# Patient Record
Sex: Male | Born: 1960 | Race: Black or African American | Hispanic: No | Smoking: Current some day smoker
Health system: Southern US, Community
[De-identification: ages and names within clinical notes are randomized; demographics above are authoritative.]

## PROBLEM LIST (undated history)

## (undated) DIAGNOSIS — I2510 Atherosclerotic heart disease of native coronary artery without angina pectoris: Secondary | ICD-10-CM

## (undated) DIAGNOSIS — F17200 Nicotine dependence, unspecified, uncomplicated: Secondary | ICD-10-CM

## (undated) DIAGNOSIS — R972 Elevated prostate specific antigen [PSA]: Secondary | ICD-10-CM

## (undated) DIAGNOSIS — I219 Acute myocardial infarction, unspecified: Secondary | ICD-10-CM

## (undated) DIAGNOSIS — I10 Essential (primary) hypertension: Secondary | ICD-10-CM

## (undated) DIAGNOSIS — D62 Acute posthemorrhagic anemia: Secondary | ICD-10-CM

## (undated) DIAGNOSIS — E876 Hypokalemia: Secondary | ICD-10-CM

## (undated) DIAGNOSIS — D119 Benign neoplasm of major salivary gland, unspecified: Secondary | ICD-10-CM

## (undated) DIAGNOSIS — I447 Left bundle-branch block, unspecified: Secondary | ICD-10-CM

## (undated) DIAGNOSIS — I639 Cerebral infarction, unspecified: Secondary | ICD-10-CM

## (undated) DIAGNOSIS — I255 Ischemic cardiomyopathy: Secondary | ICD-10-CM

## (undated) DIAGNOSIS — R0600 Dyspnea, unspecified: Secondary | ICD-10-CM

## (undated) DIAGNOSIS — I214 Non-ST elevation (NSTEMI) myocardial infarction: Secondary | ICD-10-CM

## (undated) DIAGNOSIS — I5020 Unspecified systolic (congestive) heart failure: Secondary | ICD-10-CM

## (undated) DIAGNOSIS — G459 Transient cerebral ischemic attack, unspecified: Secondary | ICD-10-CM

## (undated) DIAGNOSIS — R9389 Abnormal findings on diagnostic imaging of other specified body structures: Secondary | ICD-10-CM

## (undated) DIAGNOSIS — R011 Cardiac murmur, unspecified: Secondary | ICD-10-CM

## (undated) DIAGNOSIS — I674 Hypertensive encephalopathy: Secondary | ICD-10-CM

## (undated) DIAGNOSIS — R221 Localized swelling, mass and lump, neck: Secondary | ICD-10-CM

## (undated) DIAGNOSIS — K920 Hematemesis: Secondary | ICD-10-CM

## (undated) DIAGNOSIS — N400 Enlarged prostate without lower urinary tract symptoms: Secondary | ICD-10-CM

## (undated) DIAGNOSIS — D126 Benign neoplasm of colon, unspecified: Secondary | ICD-10-CM

## (undated) HISTORY — DX: Hypokalemia: E87.6

## (undated) HISTORY — DX: Cerebral infarction, unspecified: I63.9

## (undated) HISTORY — DX: Acute posthemorrhagic anemia: D62

## (undated) HISTORY — DX: Hematemesis: K92.0

## (undated) HISTORY — DX: Hypertensive encephalopathy: I67.4

## (undated) HISTORY — DX: Localized swelling, mass and lump, neck: R22.1

## (undated) HISTORY — DX: Benign neoplasm of colon, unspecified: D12.6

---
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
## 2007-08-31 ENCOUNTER — Emergency Department (HOSPITAL_COMMUNITY): Admission: EM | Admit: 2007-08-31 | Discharge: 2007-08-31 | Payer: Self-pay | Admitting: Emergency Medicine

## 2011-08-05 ENCOUNTER — Emergency Department (HOSPITAL_COMMUNITY)
Admission: EM | Admit: 2011-08-05 | Discharge: 2011-08-05 | Disposition: A | Discharge status: Home / Self Care | Payer: Worker's Compensation | Attending: Emergency Medicine | Admitting: Emergency Medicine

## 2011-08-05 ENCOUNTER — Encounter (HOSPITAL_COMMUNITY): Payer: Self-pay | Admitting: *Deleted

## 2011-08-05 ENCOUNTER — Emergency Department (HOSPITAL_COMMUNITY): Payer: Worker's Compensation

## 2011-08-05 DIAGNOSIS — S61409A Unspecified open wound of unspecified hand, initial encounter: Secondary | ICD-10-CM | POA: Insufficient documentation

## 2011-08-05 DIAGNOSIS — S61419A Laceration without foreign body of unspecified hand, initial encounter: Secondary | ICD-10-CM

## 2011-08-05 DIAGNOSIS — F17200 Nicotine dependence, unspecified, uncomplicated: Secondary | ICD-10-CM | POA: Insufficient documentation

## 2011-08-05 DIAGNOSIS — W260XXA Contact with knife, initial encounter: Secondary | ICD-10-CM | POA: Insufficient documentation

## 2011-08-05 MED ORDER — HYDROCODONE-ACETAMINOPHEN 5-500 MG PO TABS: 1.0000 | ORAL_TABLET | Freq: Four times a day (QID) | ORAL | Status: AC | PRN

## 2011-08-05 NOTE — ED Provider Notes (Signed)
History     CSN: [id number]  Arrival date & time 08/05/11  1447   First MD Initiated Contact with Patient 08/05/11 1710      Chief Complaint  Patient presents with  . Laceration    left hand    (Consider location/radiation/quality/duration/timing/severity/associated sxs/prior treatment) HPI Comments: Was seen at urgent care and sent here for arterial bleeding which has since stopped.  Patient is a 51 y.o. male presenting with skin laceration. The history is provided by the patient.  Laceration  Incident onset: 11:45 this morning. The laceration is located on the left hand. The laceration is 1 cm in size. The laceration mechanism was a a clean knife. The pain is moderate. The pain has been constant since onset. He reports no foreign bodies present. His tetanus status is UTD.    History reviewed. No pertinent past medical history.  History reviewed. No pertinent past surgical history.  No family history on file.  History  Substance Use Topics  . Smoking status: Current Everyday Smoker  . Smokeless tobacco: Not on file  . Alcohol Use: Yes     everyday      Review of Systems  All other systems reviewed and are negative.    Allergies  Review of patient's allergies indicates no known allergies.  Home Medications   Current Outpatient Rx  Name Route Sig Dispense Refill  . IBUPROFEN 200 MG PO TABS Oral Take 400 mg by mouth every 6 (six) hours as needed. For pain      BP 192/107  Pulse 63  Temp(Src) 98.1 F (36.7 C) (Oral)  Resp 18  SpO2 99%  Physical Exam  Nursing note and vitals reviewed. Constitutional: He is oriented to person, place, and time. He appears well-developed and well-nourished.  HENT:  Head: Normocephalic and atraumatic.  Neck: Normal range of motion. Neck supple.  Musculoskeletal:       There is a 1.5 cm laceration to the palmar aspect of the left hand on the distal palm between the 2nd and 3rd metcarpals.  There is no tendon involvement and  sensation is intact distally.  Neurological: He is alert and oriented to person, place, and time.  Skin: Skin is warm and dry.    ED Course  Procedures (including critical care time)  Labs Reviewed - No data to display Dg Hand Complete Left  08/05/2011  *RADIOLOGY REPORT*  Clinical Data: Laceration to palm of the left hand  LEFT HAND - COMPLETE 3+ VIEW  Comparison: None.  Findings:  The MTP joints are wrapped with gauze.  No discrete fracture or radiopaque foreign body.  Minimal degenerative change of the first MCP joint. Remaining joint spaces are grossly preserved.  IMPRESSION: No fracture or radiopaque foreign body.  Original Report Authenticated By: Waynard Reeds, M.D.     No diagnosis found.    MDM  LACERATION REPAIR Performed by: Geoffery Lyons Authorized by: Geoffery Lyons Consent: Verbal consent obtained. Risks and benefits: risks, benefits and alternatives were discussed Consent given by: patient Patient identity confirmed: provided demographic data Prepped and Draped in normal sterile fashion Wound explored  Laceration Location: left hand  Laceration Length: 1.5cm  No Foreign Bodies seen or palpated  Anesthesia: local infiltration  Local anesthetic: lidocaine 1% without epinephrine  Anesthetic total: 2 ml  Irrigation method: syringe Amount of cleaning: standard  Skin closure: 4-0 prolene  Number of sutures: 3  Technique: simple interrupted  Patient tolerance: Patient tolerated the procedure well with no immediate complications.  There was no arterial bleeding, no tendon involvement, and no indication for a hand surgeon.  I offered the hand surgeon to the patient, however he did not want to wait any longer and was comfortable with me closing the wound.         Geoffery Lyons, MD 08/05/11 1740

## 2011-08-05 NOTE — Discharge Instructions (Signed)

## 2011-08-05 NOTE — ED Notes (Signed)
Patient presents to ED after reportedly cutting himself with a small table knife. Patient seen at [facility] in [facility] and referred to ED after diagnosed with arterial wound to left hand. Patient with dressing to left hand with bleeding controlled. Patient c/o 10/10 pain. Mild swelling to distal fingertips, good movement, CR<2. Dr. Cliffton Asters paged patient in ED.

## 2011-08-05 NOTE — ED Notes (Signed)
Pt is here with left hand lac accidental from cooking.  Pt can wiggle fingers and bleeding controlled

## 2011-08-05 NOTE — ED Notes (Signed)
Dr. Judd Lien at bedside, finished suturing patient. Will apply dressing. Waiting for D/c paperwork. Patient aware of plan.

## 2014-03-10 DIAGNOSIS — I447 Left bundle-branch block, unspecified: Secondary | ICD-10-CM

## 2014-03-10 DIAGNOSIS — G459 Transient cerebral ischemic attack, unspecified: Secondary | ICD-10-CM

## 2014-03-10 HISTORY — DX: Transient cerebral ischemic attack, unspecified: G45.9

## 2014-03-10 HISTORY — DX: Left bundle-branch block, unspecified: I44.7

## 2014-07-07 ENCOUNTER — Inpatient Hospital Stay (HOSPITAL_COMMUNITY): Payer: Self-pay

## 2014-07-07 ENCOUNTER — Emergency Department (HOSPITAL_COMMUNITY): Payer: Self-pay

## 2014-07-07 ENCOUNTER — Inpatient Hospital Stay (HOSPITAL_COMMUNITY)
Admission: EM | Admit: 2014-07-07 | Discharge: 2014-07-08 | DRG: 069 | Disposition: A | Discharge status: Home / Self Care | Payer: Self-pay | Attending: Internal Medicine | Admitting: Internal Medicine

## 2014-07-07 ENCOUNTER — Encounter (HOSPITAL_COMMUNITY): Payer: Self-pay | Admitting: Emergency Medicine

## 2014-07-07 DIAGNOSIS — R531 Weakness: Secondary | ICD-10-CM

## 2014-07-07 DIAGNOSIS — Z9119 Patient's noncompliance with other medical treatment and regimen: Secondary | ICD-10-CM | POA: Diagnosis present

## 2014-07-07 DIAGNOSIS — R202 Paresthesia of skin: Secondary | ICD-10-CM | POA: Diagnosis present

## 2014-07-07 DIAGNOSIS — I639 Cerebral infarction, unspecified: Secondary | ICD-10-CM

## 2014-07-07 DIAGNOSIS — Z7982 Long term (current) use of aspirin: Secondary | ICD-10-CM

## 2014-07-07 DIAGNOSIS — Z681 Body mass index (BMI) 19 or less, adult: Secondary | ICD-10-CM

## 2014-07-07 DIAGNOSIS — R221 Localized swelling, mass and lump, neck: Secondary | ICD-10-CM | POA: Diagnosis present

## 2014-07-07 DIAGNOSIS — G459 Transient cerebral ischemic attack, unspecified: Secondary | ICD-10-CM

## 2014-07-07 DIAGNOSIS — Z9114 Patient's other noncompliance with medication regimen: Secondary | ICD-10-CM | POA: Diagnosis present

## 2014-07-07 DIAGNOSIS — I10 Essential (primary) hypertension: Secondary | ICD-10-CM

## 2014-07-07 DIAGNOSIS — G453 Amaurosis fugax: Secondary | ICD-10-CM | POA: Diagnosis present

## 2014-07-07 DIAGNOSIS — F17210 Nicotine dependence, cigarettes, uncomplicated: Secondary | ICD-10-CM | POA: Diagnosis present

## 2014-07-07 DIAGNOSIS — G4489 Other headache syndrome: Secondary | ICD-10-CM | POA: Diagnosis present

## 2014-07-07 DIAGNOSIS — I159 Secondary hypertension, unspecified: Secondary | ICD-10-CM | POA: Diagnosis present

## 2014-07-07 DIAGNOSIS — H540 Blindness, both eyes: Secondary | ICD-10-CM | POA: Diagnosis present

## 2014-07-07 DIAGNOSIS — Z8572 Personal history of non-Hodgkin lymphomas: Secondary | ICD-10-CM

## 2014-07-07 DIAGNOSIS — R634 Abnormal weight loss: Secondary | ICD-10-CM | POA: Diagnosis present

## 2014-07-07 DIAGNOSIS — F17200 Nicotine dependence, unspecified, uncomplicated: Secondary | ICD-10-CM

## 2014-07-07 DIAGNOSIS — F1290 Cannabis use, unspecified, uncomplicated: Secondary | ICD-10-CM | POA: Diagnosis present

## 2014-07-07 DIAGNOSIS — Z8673 Personal history of transient ischemic attack (TIA), and cerebral infarction without residual deficits: Secondary | ICD-10-CM | POA: Insufficient documentation

## 2014-07-07 DIAGNOSIS — H547 Unspecified visual loss: Secondary | ICD-10-CM

## 2014-07-07 HISTORY — DX: Paresthesia of skin: R20.2

## 2014-07-07 HISTORY — DX: Essential (primary) hypertension: I10

## 2014-07-07 HISTORY — DX: Localized swelling, mass and lump, neck: R22.1

## 2014-07-07 HISTORY — DX: Weakness: R53.1

## 2014-07-07 LAB — GLUCOSE, CAPILLARY
Glucose-Capillary: 113 mg/dL — ABNORMAL HIGH (ref 70–99)
Glucose-Capillary: 100 mg/dL — ABNORMAL HIGH (ref 70–99)

## 2014-07-07 LAB — URINALYSIS, ROUTINE W REFLEX MICROSCOPIC
Bilirubin Urine: NEGATIVE
GLUCOSE, UA: NEGATIVE mg/dL
KETONES UR: NEGATIVE mg/dL
LEUKOCYTES UA: NEGATIVE
NITRITE: NEGATIVE
PH: 5.5 (ref 5.0–8.0)
Protein, ur: NEGATIVE mg/dL
Specific Gravity, Urine: 1.016 (ref 1.005–1.030)
Urobilinogen, UA: 1 mg/dL (ref 0.0–1.0)

## 2014-07-07 LAB — RAPID URINE DRUG SCREEN, HOSP PERFORMED
Amphetamines: NOT DETECTED
BARBITURATES: NOT DETECTED
BENZODIAZEPINES: NOT DETECTED
Cocaine: NOT DETECTED
Opiates: NOT DETECTED
TETRAHYDROCANNABINOL: POSITIVE — AB

## 2014-07-07 LAB — TROPONIN I
Troponin I: <0.03 ng/mL (ref <0.031)
Troponin I: <0.03 ng/mL (ref <0.031)

## 2014-07-07 LAB — I-STAT CHEM 8, ED
BUN: 8 mg/dL (ref 6–23)
Calcium, Ion: 1.13 mmol/L (ref 1.12–1.23)
Chloride: 101 mmol/L (ref 96–112)
Creatinine, Ser: 0.8 mg/dL (ref 0.50–1.35)
GLUCOSE: 112 mg/dL — AB (ref 70–99)
HCT: 50 % (ref 39.0–52.0)
HEMOGLOBIN: 17 g/dL (ref 13.0–17.0)
POTASSIUM: 3.9 mmol/L (ref 3.5–5.1)
SODIUM: 138 mmol/L (ref 135–145)
TCO2: 23 mmol/L (ref 0–100)

## 2014-07-07 LAB — DIFFERENTIAL
BASOS ABS: 0 10*3/uL (ref 0.0–0.1)
Basophils Relative: 0 % (ref 0–1)
Eosinophils Absolute: 0.1 10*3/uL (ref 0.0–0.7)
Eosinophils Relative: 2 % (ref 0–5)
LYMPHS PCT: 37 % (ref 12–46)
Lymphs Abs: 2.7 10*3/uL (ref 0.7–4.0)
MONO ABS: 0.9 10*3/uL (ref 0.1–1.0)
MONOS PCT: 12 % (ref 3–12)
NEUTROS ABS: 3.7 10*3/uL (ref 1.7–7.7)
NEUTROS PCT: 49 % (ref 43–77)

## 2014-07-07 LAB — COMPREHENSIVE METABOLIC PANEL
ALK PHOS: 57 U/L (ref 39–117)
ALT: 11 U/L (ref 0–53)
ANION GAP: 8 (ref 5–15)
AST: 18 U/L (ref 0–37)
Albumin: 3.3 g/dL — ABNORMAL LOW (ref 3.5–5.2)
BILIRUBIN TOTAL: 0.6 mg/dL (ref 0.3–1.2)
BUN: 8 mg/dL (ref 6–23)
CALCIUM: 8.9 mg/dL (ref 8.4–10.5)
CHLORIDE: 103 mmol/L (ref 96–112)
CO2: 25 mmol/L (ref 19–32)
Creatinine, Ser: 0.86 mg/dL (ref 0.50–1.35)
GFR calc Af Amer: >90 mL/min (ref >90)
GLUCOSE: 110 mg/dL — AB (ref 70–99)
POTASSIUM: 3.9 mmol/L (ref 3.5–5.1)
Sodium: 136 mmol/L (ref 135–145)
Total Protein: 6.2 g/dL (ref 6.0–8.3)

## 2014-07-07 LAB — URINE MICROSCOPIC-ADD ON

## 2014-07-07 LAB — CBC
HEMATOCRIT: 44.9 % (ref 39.0–52.0)
Hemoglobin: 15.4 g/dL (ref 13.0–17.0)
MCH: 28.5 pg (ref 26.0–34.0)
MCHC: 34.3 g/dL (ref 30.0–36.0)
MCV: 83.1 fL (ref 78.0–100.0)
Platelets: 312 10*3/uL (ref 150–400)
RBC: 5.4 MIL/uL (ref 4.22–5.81)
RDW: 16.1 % — ABNORMAL HIGH (ref 11.5–15.5)
WBC: 7.4 10*3/uL (ref 4.0–10.5)

## 2014-07-07 LAB — I-STAT TROPONIN, ED: Troponin i, poc: 0 ng/mL (ref 0.00–0.08)

## 2014-07-07 LAB — ETHANOL: Alcohol, Ethyl (B): <5 mg/dL (ref 0–9)

## 2014-07-07 LAB — APTT: aPTT: 28 seconds (ref 24–37)

## 2014-07-07 LAB — PROTIME-INR
INR: 1 (ref 0.00–1.49)
Prothrombin Time: 13.3 seconds (ref 11.6–15.2)

## 2014-07-07 LAB — TSH: TSH: 0.621 u[IU]/mL (ref 0.350–4.500)

## 2014-07-07 LAB — CBG MONITORING, ED: Glucose-Capillary: 119 mg/dL — ABNORMAL HIGH (ref 70–99)

## 2014-07-07 MED ORDER — ONDANSETRON HCL 4 MG/2ML IJ SOLN: 4.0000 mg | Freq: Four times a day (QID) | INTRAMUSCULAR | Status: DC | PRN

## 2014-07-07 MED ORDER — ACETAMINOPHEN 325 MG PO TABS
650.0000 mg | ORAL_TABLET | ORAL | Status: DC | PRN
  Administered 2014-07-07: 650 mg via ORAL
  Filled 2014-07-07: qty 2

## 2014-07-07 MED ORDER — ASPIRIN EC 81 MG PO TBEC
81.0000 mg | DELAYED_RELEASE_TABLET | Freq: Every day | ORAL | Status: DC
  Administered 2014-07-07 – 2014-07-08 (×2): 81 mg via ORAL
  Filled 2014-07-07 (×2): qty 1

## 2014-07-07 MED ORDER — STROKE: EARLY STAGES OF RECOVERY BOOK
Freq: Once | Status: AC
  Administered 2014-07-07: 13:00:00

## 2014-07-07 NOTE — Code Documentation (Signed)
54yo male arriving to [facility] via private vehicle at [phone].  Patient was riding in the car with his wife on the way to work when he had sudden onset dizziness and blurred vision.  Patient also reports right arm tingling.  Patient brought to the hospital where Code Stroke was activated.  Patient to CT.  Stroke team to the bedside.  NIHSS 1, see documentation for details and code stroke times.  Patient continues to c/o dizziness.  Dr. Nicole Kindred at the bedside.  Patient is too mild to treat at this time, however, patient remains in the window to treat with tPA until 1300 should symptoms worsen.  Bedside handoff with ED RN Junie Panning.

## 2014-07-07 NOTE — Consult Note (Signed)
Admission H&P    Chief Complaint: Dizziness and blurred vision as well as tingling involving right upper extremity.  HPI: Daniel Weiss is an 54 y.o. male with a history of hypertension and noncompliance with treatment, presenting with intermittent dizziness over the past couple of weeks with acute onset of severe dizziness and blurred vision and tingling involving his right upper extremity at 8:30 this morning. He has no previous history of stroke nor TIA. He has not been on antiplatelet therapy. CT scan of his head showed no acute intracranial abnormality. NIH stroke score was 1.  LSN: 8:30 AM on 07/07/2014 tPA Given: No: Minimal deficits mRankin:  Past Medical History  Diagnosis Date  . Hypertension     History reviewed. No pertinent past surgical history.   Family history: Positive for stroke involving his mother; negative for hypertension.  Social History:  reports that he has been smoking.  He does not have any smokeless tobacco history on file. He reports that he drinks alcohol. He reports that he uses illicit drugs (Marijuana).  Allergies: No Known Allergies  Medication: Ibuprofen 400 mg every 6 hours as needed for pain  ROS: History obtained from spouse and the patient  General ROS: negative for - chills, fatigue, fever, night sweats, weight gain or weight loss Psychological ROS: negative for - behavioral disorder, hallucinations, memory difficulties, mood swings or suicidal ideation Ophthalmic ROS: negative for - blurry vision, double vision, eye pain or loss of vision ENT ROS: Right lateral neck mass present for about 6 years Allergy and Immunology ROS: negative for - hives or itchy/watery eyes Hematological and Lymphatic ROS: negative for - bleeding problems, bruising or swollen lymph nodes Endocrine ROS: negative for - galactorrhea, hair pattern changes, polydipsia/polyuria or temperature intolerance Respiratory ROS: negative for - cough, hemoptysis, shortness of  breath or wheezing Cardiovascular ROS: negative for - chest pain, dyspnea on exertion, edema or irregular heartbeat Gastrointestinal ROS: Positive for bright red blood with bowel movements Genito-Urinary ROS: negative for - dysuria, hematuria, incontinence or urinary frequency/urgency Musculoskeletal ROS: negative for - joint swelling or muscular weakness Neurological ROS: as noted in HPI Dermatological ROS: negative for rash and skin lesion changes  Physical Examination: Blood pressure 175/103, pulse 77, resp. rate 17, SpO2 98 %.  HEENT-  Normocephalic, no lesions, without obvious abnormality.  Normal external eye and conjunctiva.  Normal TM's bilaterally.  Normal auditory canals and external ears. Normal external nose, mucus membranes and septum.  Normal pharynx. Neck supple; large mass at angle of the jaw and lateral neck on the right Cardiovascular - regular rate and rhythm, S1, S2 normal, no murmur, click, rub or gallop Lungs - chest clear, no wheezing, rales, normal symmetric air entry, Heart exam - S1, S2 normal, no murmur, no gallop, rate regular Abdomen - soft, non-tender; bowel sounds normal; no masses,  no organomegaly Extremities - no joint deformities, effusion, or inflammation and no edema  Neurologic Examination: Mental Status: Alert, oriented to correct age but not to current month.  Speech fluent without evidence of aphasia. Able to follow commands without difficulty. Cranial Nerves: II-Visual fields were normal. III/IV/VI-Pupils were equal and reacted normally to light. Extraocular movements were full and conjugate.    V/VII-no facial numbness and no facial weakness. VIII-normal. X-normal speech and symmetrical palatal movement. XI: trapezius strength/neck flexion strength normal bilaterally XII-midline tongue extension with normal strength. Motor: 5/5 bilaterally with normal tone and bulk Sensory: Normal throughout. Deep Tendon Reflexes: 1+ and symmetric. Plantars:  Mute bilaterally Cerebellar: Normal  finger-to-nose testing. Carotid auscultation: Normal  Results for orders placed or performed during the hospital encounter of 07/07/14 (from the past 48 hour(s))  CBG monitoring, ED     Status: Abnormal   Collection Time: 07/07/14  8:58 AM  Result Value Ref Range   Glucose-Capillary 119 (H) 70 - 99 mg/dL  CBC     Status: Abnormal   Collection Time: 07/07/14  9:17 AM  Result Value Ref Range   WBC 7.4 4.0 - 10.5 K/uL   RBC 5.40 4.22 - 5.81 MIL/uL   Hemoglobin 15.4 13.0 - 17.0 g/dL   HCT 44.9 39.0 - 52.0 %   MCV 83.1 78.0 - 100.0 fL   MCH 28.5 26.0 - 34.0 pg   MCHC 34.3 30.0 - 36.0 g/dL   RDW 16.1 (H) 11.5 - 15.5 %   Platelets 312 150 - 400 K/uL  Differential     Status: None (Preliminary result)   Collection Time: 07/07/14  9:17 AM  Result Value Ref Range   Neutrophils Relative % PENDING 43 - 77 %   Neutro Abs PENDING 1.7 - 7.7 K/uL   Band Neutrophils PENDING 0 - 10 %   Lymphocytes Relative PENDING 12 - 46 %   Lymphs Abs PENDING 0.7 - 4.0 K/uL   Monocytes Relative PENDING 3 - 12 %   Monocytes Absolute PENDING 0.1 - 1.0 K/uL   Eosinophils Relative PENDING 0 - 5 %   Eosinophils Absolute PENDING 0.0 - 0.7 K/uL   Basophils Relative PENDING 0 - 1 %   Basophils Absolute PENDING 0.0 - 0.1 K/uL   WBC Morphology PENDING    RBC Morphology PENDING    Smear Review PENDING    nRBC PENDING 0 /100 WBC   Metamyelocytes Relative PENDING %   Myelocytes PENDING %   Promyelocytes Absolute PENDING %   Blasts PENDING %  I-Stat Chem 8, ED     Status: Abnormal   Collection Time: 07/07/14  9:28 AM  Result Value Ref Range   Sodium 138 135 - 145 mmol/L   Potassium 3.9 3.5 - 5.1 mmol/L   Chloride 101 96 - 112 mmol/L   BUN 8 6 - 23 mg/dL   Creatinine, Ser 0.80 0.50 - 1.35 mg/dL   Glucose, Bld 112 (H) 70 - 99 mg/dL   Calcium, Ion 1.13 1.12 - 1.23 mmol/L   TCO2 23 0 - 100 mmol/L   Hemoglobin 17.0 13.0 - 17.0 g/dL   HCT 50.0 39.0 - 52.0 %   Ct Head Wo  Contrast  07/07/2014   CLINICAL DATA:  Sudden onset blurred vision, slurred speech, and diaphoresis on the way to work this morning. Right arm tingling and pain.  EXAM: CT HEAD WITHOUT CONTRAST  TECHNIQUE: Contiguous axial images were obtained from the base of the skull through the vertex without intravenous contrast.  COMPARISON:  None.  FINDINGS: There is no evidence of acute cortical infarct, intracranial hemorrhage, mass, midline shift, or extra-axial fluid collection. Ventricles and sulci are normal.  Orbits are unremarkable. Mastoid air cells are clear. There is mild mucosal thickening throughout the right greater than left paranasal sinuses. No skull fracture.  IMPRESSION: Unremarkable CT appearance of the brain.   Electronically Signed   By: Logan Bores   On: 07/07/2014 09:13    Assessment: 54 y.o. male with risk factors for stroke presenting with possible acute pontine or cerebellar TIA or infarction.  Stroke Risk Factors - family history and hypertension  Plan: 1. HgbA1c, fasting lipid panel  2. MRI, MRA  of the brain without contrast 3. PT consult, OT consult, Speech consult 4. Echocardiogram 5. Carotid dopplers 6. Prophylactic therapy-Antiplatelet med: Aspirin  7. Risk factor modification 8. Telemetry monitoring  C.R. Nicole Kindred, MD [facility] [phone]  07/07/2014, 9:47 AM

## 2014-07-07 NOTE — Progress Notes (Signed)
Patient reported bright red blood in stool before coming to ED today. MD aware. New orders placed

## 2014-07-07 NOTE — H&P (Signed)
Date: 07/07/2014               Patient Name:  Daniel Weiss MRN: [id number]  DOB: 1961/03/03 Age / Sex: 54 y.o., male   PCP: No primary care provider on file.         Medical Service: Internal Medicine Teaching Service         Attending Physician: Dr. Sid Falcon, MD    First Contact: Dr. Genene Churn Pager: [phone]  Second Contact: Dr. Heber  Pager: [phone]       After Hours (After 5p/  First Contact Pager: [phone]  weekends / holidays): Second Contact Pager: [phone]   Chief Complaint: paresthesia, generalized weakness, vision loss.  History of Present Illness:   54 yo with reported hx of HTN comes in with feeling lightheaded, loss of b/l vision (painless), generalized whole body weakness, and paresthesia of right arm. This happened as he was going to work in his car, his wife we driving, and he felt these symptoms. initiailly his vision was blurry then was completely dark for 5 minutes then came back to normal. He remained weak overall, limping, and had right arm numbness for about 20 minutes. Now he is back to normal. Has some headache 3/10 now but no other symptoms currently.  He states his only pmh is hypertension, doesn't know baseline BP, does not take any medications. Does not have any PCP. Has not seen a doctor for a long time. He also has a mass on the right side of his neck that has been there for last 7 years. It has grown in size per patient. He never had it evaluated. It's not painful and no discharge from it. He had some weight loss (not sure how much), has been eating and drinking fine. Had some nausea intermittently for last 3 weeks, no diarrhea. No sick contacts, no fever,chills,chest pain, sob.     Meds: Current Facility-Administered Medications  Medication Dose Route Frequency Provider Last Rate Last Dose  .  stroke: mapping our early stages of recovery book   Does not apply Once Wilber Oliphant, MD      . acetaminophen (TYLENOL) tablet 650 mg  650 mg Oral Q4H PRN  Wilber Oliphant, MD      . aspirin EC tablet 81 mg  81 mg Oral Daily Wilber Oliphant, MD      . ondansetron [facility]) injection 4 mg  4 mg Intravenous Q6H PRN Wilber Oliphant, MD        Allergies: Allergies as of 07/07/2014  . (No Known Allergies)   Past Medical History  Diagnosis Date  . Hypertension    History reviewed. No pertinent past surgical history. No family history on file. History   Social History  . Marital Status: Married    Spouse Name: N/A  . Number of Children: N/A  . Years of Education: N/A   Occupational History  . Not on file.   Social History Main Topics  . Smoking status: Current Every Day Smoker  . Smokeless tobacco: Not on file  . Alcohol Use: Yes     Comment: everyday  . Drug Use: Yes    Special: Marijuana  . Sexual Activity: Not on file   Other Topics Concern  . Not on file   Social History Narrative    Review of Systems: Review of Systems  Constitutional: Positive for weight loss. Negative for fever, chills and diaphoresis.  HENT: Negative for congestion, ear discharge, ear pain, hearing loss,  nosebleeds, sore throat and tinnitus.   Eyes: Positive for blurred vision. Negative for photophobia, pain, discharge and redness.  Respiratory: Negative.  Negative for cough, sputum production, shortness of breath, wheezing and stridor.   Cardiovascular: Negative for chest pain and palpitations.  Gastrointestinal: Positive for nausea and vomiting. Negative for heartburn, abdominal pain, diarrhea, constipation, blood in stool and melena.  Genitourinary: Negative for dysuria, urgency, frequency, hematuria and flank pain.  Musculoskeletal: Negative for back pain, joint pain and neck pain.  Skin: Negative for itching and rash.  Neurological: Positive for weakness and headaches. Negative for dizziness, tingling, seizures and loss of consciousness.  Endo/Heme/Allergies: Negative.   Psychiatric/Behavioral: Negative for depression, suicidal ideas and  hallucinations.     Physical Exam: Blood pressure 152/99, pulse 71, temperature 98.1 F (36.7 C), temperature source Oral, resp. rate 20, height 6' (1.829 m), weight 145 lb 8 oz (65.998 kg), SpO2 97 %.  Physical Exam  Constitutional: He is oriented to person, place, and time. He appears well-developed.  Thin appearing male.  HENT:  Head: Normocephalic and atraumatic.  Right Ear: External ear normal.  Left Ear: External ear normal.  Mouth/Throat: Oropharynx is clear and moist.  Eyes: Conjunctivae and EOM are normal. Pupils are equal, round, and reactive to light. Right eye exhibits no discharge. Left eye exhibits no discharge. No scleral icterus.  Neck: Normal range of motion. No tracheal deviation present. No thyromegaly present.  Has a tennis ball size ~5-7 cm hard, non tenderness, slightly mobile mass on the right side of his neck. Normal skin toned. No discharge or warmth.    Cardiovascular: Normal rate, regular rhythm and normal heart sounds.  Exam reveals no gallop and no friction rub.   No murmur heard. Respiratory: Effort normal and breath sounds normal. No respiratory distress. He has no wheezes. He has no rales. He exhibits no tenderness.  GI: Soft. Bowel sounds are normal. He exhibits no distension and no mass. There is no tenderness. There is no guarding.  Musculoskeletal: Normal range of motion. He exhibits no edema or tenderness.  Neurological: He is alert and oriented to person, place, and time. He has normal reflexes.  Normal finger-to-nose exam, normal heel-to-shin, no pronator drift, normal alternating movement, normal gait.    Lab results: Basic Metabolic Panel:  Recent Labs  07/07/14 0917 07/07/14 0928  NA 136 138  K 3.9 3.9  CL 103 101  CO2 25  --   GLUCOSE 110* 112*  BUN 8 8  CREATININE 0.86 0.80  CALCIUM 8.9  --    Liver Function Tests:  Recent Labs  07/07/14 0917  AST 18  ALT 11  ALKPHOS 57  BILITOT 0.6  PROT 6.2  ALBUMIN 3.3*   CBC:  Recent Labs  07/07/14 0917 07/07/14 0928  WBC 7.4  --   NEUTROABS 3.7  --   HGB 15.4 17.0  HCT 44.9 50.0  MCV 83.1  --   PLT 312  --    Cardiac Enzymes:  Recent Labs  07/07/14 1111  TROPONINI <0.03   CBG:  Recent Labs  07/07/14 0858  GLUCAP 119*   Thyroid Function Tests:  Recent Labs  07/07/14 1111  TSH 0.621    Coagulation:  Recent Labs  07/07/14 0917  LABPROT 13.3  INR 1.00   Urine Drug Screen: Drugs of Abuse     Component Value Date/Time   LABOPIA NONE DETECTED 07/07/2014 1022   COCAINSCRNUR NONE DETECTED 07/07/2014 1022   LABBENZ NONE DETECTED 07/07/2014 1022  AMPHETMU NONE DETECTED 07/07/2014 1022   THCU POSITIVE* 07/07/2014 1022   LABBARB NONE DETECTED 07/07/2014 1022    Alcohol Level:  Recent Labs  07/07/14 0917  ETH <5   Urinalysis:  Recent Labs  07/07/14 1022  COLORURINE YELLOW  LABSPEC 1.016  PHURINE 5.5  GLUCOSEU NEGATIVE  HGBUR TRACE*  BILIRUBINUR NEGATIVE  KETONESUR NEGATIVE  PROTEINUR NEGATIVE  UROBILINOGEN 1.0  NITRITE NEGATIVE  LEUKOCYTESUR NEGATIVE   Misc. Labs:  Imaging results:  Ct Head Wo Contrast  07/07/2014   CLINICAL DATA:  Sudden onset blurred vision, slurred speech, and diaphoresis on the way to work this morning. Right arm tingling and pain.  EXAM: CT HEAD WITHOUT CONTRAST  TECHNIQUE: Contiguous axial images were obtained from the base of the skull through the vertex without intravenous contrast.  COMPARISON:  None.  FINDINGS: There is no evidence of acute cortical infarct, intracranial hemorrhage, mass, midline shift, or extra-axial fluid collection. Ventricles and sulci are normal.  Orbits are unremarkable. Mastoid air cells are clear. There is mild mucosal thickening throughout the right greater than left paranasal sinuses. No skull fracture.  IMPRESSION: Unremarkable CT appearance of the brain.   Electronically Signed   By: Logan Bores   On: 07/07/2014 09:13    Other results: HER:DEYCX  rhythm, LBBB, atrial enlargement.  Assessment & Plan by Problem: Active Problems:   Paresthesia   Hypertension   Right sided weakness  54 yo male with HTN here with CVA  CVA - TIA vs stroke  - with paresthesia, painless vision loss, generalized weakness, RUE paresthesia which all resolved after 20-30 mins. Now back to normal - CT head negative. Neurology on board.  - appreciate neuro recs: MRI, MRA, ECHO, carotid doppler, monitor on tele - risk mod: hgba1c, lipid panel - pt/ ot/ speech consult  HTN - allow permissive htn for now.  Neck mass - unclear etiology Concerning for hodgkin's lymphoma (with weight loss and pain less minimally mobile enlarging mass.  Other etiologies include cyst, mass has been present for years with some ?changes may be benign neoplasm of salivary gland tumors, peripheral nerve sheath tumor,  - soft tissue u/s to characterize the mass. Further workup depending on the u/s results.  Dispo: Disposition is deferred at this time, awaiting improvement of current medical problems. Anticipated discharge in approximately 1-2 day(s).   The patient does have a current PCP (No primary care provider on file.) and does need an [facility] hospital follow-up appointment after discharge.  The patient does have transportation limitations that hinder transportation to clinic appointments.  Signed: Dellia Nims, MD 07/07/2014, 12:54 PM

## 2014-07-07 NOTE — Progress Notes (Signed)
VASCULAR LAB PRELIMINARY  PRELIMINARY  PRELIMINARY  PRELIMINARY  Carotid Duplex completed.    Preliminary report:  Carotid ICA velocities and ICA/CCA ratios are within 1-39% range of stenosis bilaterally.    Vertebral arteries are patent and antegrade bilaterally.  Incidental documentation of vascular flow within the large protruding mass on the right side of neck that is also displacing the right ICA and ECA posteriorly.  Daniel Weiss, RVT 07/07/2014, 3:52 PM

## 2014-07-07 NOTE — ED Notes (Signed)
Neurologist reports no TPA at this time but to keep pt in window- 15 minute vitals and q73min neuro checks.

## 2014-07-07 NOTE — Progress Notes (Signed)
Patient arrived to 13N08. VSS, neuro assessment stable. Patient complaining of "2/10" headache on right side of head. Tele started, IV flushed and patent. Patient oriented to room and floor. Patient vocalized understanding of fall prevention, stated he will call and wait for staff assistance prior to ambulation. Call bell within patient's reach, will continue to monitor. MD at bedside on initial assessment.

## 2014-07-07 NOTE — ED Notes (Signed)
Pt reports he was riding to work with his wife at 0830 this morning when he became very dizzy, diaphoretic and his vision became blurry.  Pt reports some tingling to his right arm and feeling "very dizzy".

## 2014-07-07 NOTE — ED Notes (Signed)
Patient transported to CT 

## 2014-07-07 NOTE — ED Provider Notes (Signed)
CSN: [id number]     Arrival date & time 07/07/14  0849 History   First MD Initiated Contact with Patient 07/07/14 [phone]     Chief Complaint  Patient presents with  . Eye Problem  . Arm Pain  . Aphasia  . Code Stroke     (Consider location/radiation/quality/duration/timing/severity/associated sxs/prior Treatment) HPI  Daniel Weiss is a 54 y.o. male who presents for evaluation of headache, tingling in right arm, and diaphoresis. He had sudden onset of symptoms, at 8:30 AM this morning. At that time his wife was driving him to work. He told her that he didn't feel well. He wanted to come to the hospital. No similar problems in the past. There are no other known modifying factors.  Level V Caveat- acute illness     Past Medical History  Diagnosis Date  . Hypertension    History reviewed. No pertinent past surgical history. No family history on file. History  Substance Use Topics  . Smoking status: Current Every Day Smoker  . Smokeless tobacco: Not on file  . Alcohol Use: Yes     Comment: everyday    Review of Systems  Unable to perform ROS     Allergies  Review of patient's allergies indicates no known allergies.  Home Medications   Prior to Admission medications   Medication Sig Start Date End Date Taking? Authorizing Provider  ibuprofen (ADVIL,MOTRIN) 200 MG tablet Take 400 mg by mouth every 6 (six) hours as needed. For pain   Yes Historical Provider, MD   BP 152/81 mmHg  Pulse 77  Resp 22  SpO2 97% Physical Exam  Constitutional: He is oriented to person, place, and time. He appears well-developed and well-nourished. He appears distressed (she appears uncomfortable.).  HENT:  Head: Normocephalic and atraumatic.  Right Ear: External ear normal.  Left Ear: External ear normal.  Eyes: Conjunctivae and EOM are normal. Pupils are equal, round, and reactive to light.  Tearing, bilateral eyes. Pupils are equal and reactive.  Neck: Normal range of motion and  phonation normal. Neck supple.  Soft tissue mass, right upper neck and angle of jaw, about 4 x 8 cm, soft, nontender to palpation and mobile. 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  Cardiovascular: Normal rate, regular rhythm and normal heart sounds.   Pulmonary/Chest: Effort normal and breath sounds normal. He exhibits no bony tenderness.  Abdominal: Soft. There is no tenderness.  Musculoskeletal: Normal range of motion.  Neurological: He is alert and oriented to person, place, and time. No cranial nerve deficit or sensory deficit. He exhibits normal muscle tone. Coordination normal.  No dysarthria and aphasia or nystagmus. No dysmetria. Normal arm and leg strength bilaterally.  Skin: Skin is warm, dry and intact.  Psychiatric: He has a normal mood and affect. His behavior is normal. Judgment and thought content normal.  Nursing note and vitals reviewed.   ED Course  Procedures (including critical care time)  09:00- thrombolysis consideration.- He is within the time frame for code stroke, code stroke called. Symptoms primarily dizziness, and right arm numbness.  09:10- stroke neurologist, Dr. Nicole Kindred in the room at this time evaluating the patient. Will defer tPA decision, to him.  09:20- Dr. Nicole Kindred has decided against giving thrombolysis  Medications - No data to display  Patient Vitals for the past 24 hrs:  BP Pulse Resp SpO2  07/07/14 1015 152/81 mmHg 77 22 97 %  07/07/14 1000 169/100 mmHg 74 22 97 %  07/07/14 0945 162/100 mmHg  76 15 97 %  07/07/14 0930 (!) 175/103 mmHg 77 17 98 %  07/07/14 0915 (!) 170/104 mmHg 88 21 99 %  07/07/14 0910 (!) 171/109 mmHg 88 13 100 %    10:44 AM Reevaluation with update and discussion. After initial assessment and treatment, an updated evaluation reveals no change in clinical status. Patient admits to noncompliance with antihypertensive, for 2 years. Findings discussed with patient, family members, treatment plan discussed.Daleen Bo L     10:46 AM-Consult complete with on-cal [facility] resident. Patient case explained and discussed. She agrees to admit patient for further evaluation and treatment. Call ended at [place] Reviewed  CBC - Abnormal; Notable for the following:    RDW 16.1 (*)    All other components within normal limits  COMPREHENSIVE METABOLIC PANEL - Abnormal; Notable for the following:    Glucose, Bld 110 (*)    Albumin 3.3 (*)    All other components within normal limits  I-STAT CHEM 8, ED - Abnormal; Notable for the following:    Glucose, Bld 112 (*)    All other components within normal limits  CBG MONITORING, ED - Abnormal; Notable for the following:    Glucose-Capillary 119 (*)    All other components within normal limits  ETHANOL  PROTIME-INR  APTT  DIFFERENTIAL  URINE RAPID DRUG SCREEN (HOSP PERFORMED)  URINALYSIS, ROUTINE W REFLEX MICROSCOPIC  I-STAT TROPOININ, ED  I-STAT TROPOININ, ED    Imaging Review Ct Head Wo Contrast  07/07/2014   CLINICAL DATA:  Sudden onset blurred vision, slurred speech, and diaphoresis on the way to work this morning. Right arm tingling and pain.  EXAM: CT HEAD WITHOUT CONTRAST  TECHNIQUE: Contiguous axial images were obtained from the base of the skull through the vertex without intravenous contrast.  COMPARISON:  None.  FINDINGS: There is no evidence of acute cortical infarct, intracranial hemorrhage, mass, midline shift, or extra-axial fluid collection. Ventricles and sulci are normal.  Orbits are unremarkable. Mastoid air cells are clear. There is mild mucosal thickening throughout the right greater than left paranasal sinuses. No skull fracture.  IMPRESSION: Unremarkable CT appearance of the brain.   Electronically Signed   By: Logan Bores   On: 07/07/2014 09:13     EKG Interpretation   Date/Time:  Friday July 07 2014 08:58:09 EDT Ventricular Rate:  79 PR Interval:  143 QRS Duration: 139 QT Interval:  436 QTC Calculation: 500 R Axis:    82 Text Interpretation:  Sinus rhythm Probable left atrial enlargement IVCD,  consider atypical LBBB No old tracing to compare Confirmed by [facility]  MD,  Enmanuel Zufall ([id number]) on 07/07/2014 9:13:13 AM      MDM   Final diagnoses:  Paresthesia  Other headache syndrome  Secondary hypertension, unspecified  H/O medication noncompliance    Headache, paresthesia and dizziness are consistent with TIA versus stroke. Low NIH score. TPA not indicated. Patient will require admission for further monitoring and treatment.  Nursing Notes Reviewed/ Care Coordinated, and agree without changes. Applicable Imaging Reviewed.  Interpretation of Laboratory Data incorporated into ED treatment   Plab : Admit          Daleen Bo, MD 07/07/14 1057

## 2014-07-07 NOTE — Progress Notes (Signed)
  Echocardiogram 2D Echocardiogram has been performed.  Daniel Weiss 07/07/2014, 3:16 PM

## 2014-07-07 NOTE — ED Notes (Signed)
Pt brought back to room 12 at this time.  Stroke team at bedside.

## 2014-07-07 NOTE — Evaluation (Signed)
Physical Therapy Evaluation and Discharge Patient Details Name: Daniel Weiss MRN: [id number] DOB: September 23, 1960 Today's Date: 07/07/2014   History of Present Illness  54 y.o. male admitted for TIA vs stroke, Hx of neck mass.  Clinical Impression  Patient evaluated by Physical Therapy with no further acute PT needs identified. All education has been completed and the patient has no further questions. [name] tolerates challenging dynamic gait tasks without significant change in balance. States he feels back to his baseline and complains of no symptoms at this time, which were present upon admission. Eager to return to work. No functional deficits found on evaluation. See below for any follow-up Physial Therapy or equipment needs. PT is signing off. Thank you for this referral.   Follow Up Recommendations No PT follow up    Equipment Recommendations  None recommended by PT    Recommendations for Other Services       Precautions / Restrictions Precautions Precautions: None Restrictions Weight Bearing Restrictions: No      Mobility  Bed Mobility Overal bed mobility: Independent                Transfers Overall transfer level: Independent                  Ambulation/Gait Ambulation/Gait assistance: Modified independent (Device/Increase time) Ambulation Distance (Feet): 525 Feet Assistive device: None Gait Pattern/deviations: Step-through pattern;Scissoring   Gait velocity interpretation: at or above normal speed for age/gender General Gait Details: Tolerated dynamic gait challenges including high marching, variable speeds, change in direction with quick turns, and vertical/horizontal head turns. Only balance difficulty noted was scissoring of gait with slow gait speed challenge. Did not require physical assist throughout therapy session. Denies any changes in balance today compared to baseline.  Stairs            Wheelchair Mobility    Modified Rankin  (Stroke Patients Only) Modified Rankin (Stroke Patients Only) Pre-Morbid Rankin Score: No symptoms Modified Rankin: No symptoms     Balance Overall balance assessment: Independent                                           Pertinent Vitals/Pain Pain Assessment: No/denies pain  No significant findings with heel-to-shin test bil, finger to nose test bil. Mild nystagmus Lt and Rt towards end of range with gaze. Normal pronation/supination test.    Home Living Family/patient expects to be discharged to:: Private residence Living Arrangements: Spouse/significant other Available Help at Discharge: Family;Available 24 hours/day ("for the next couple of days"  both work during week) Type of Home: House Home Access: Level entry     Home Layout: One [place]: None      Prior Function Level of Independence: Independent               Hand Dominance   Dominant Hand: Right    Extremity/Trunk Assessment   Upper Extremity Assessment: Defer to OT evaluation           Lower Extremity Assessment: Overall WFL for tasks assessed (normal heel-to-shin and light touch sens. BIL)      Cervical / Trunk Assessment:  (Rt lateral mass)  Communication   Communication: No difficulties  Cognition Arousal/Alertness: Awake/alert Behavior During Therapy: WFL for tasks assessed/performed Overall Cognitive Status: Within Functional Limits for tasks assessed  General Comments General comments (skin integrity, edema, etc.): Pt denies any symptoms present prior to admission, at this time. Educated on importance of proper medication compliance, diet, and exercise to reduce risk of TIA/CVA in the event that it discovered either of these processes resulted in his admitting complications.    Exercises        Assessment/Plan    PT Assessment Patent does not need any further PT services  PT Diagnosis Other (comment);Abnormality of  gait (Admitted for paresthesias & vision loss. Has since resolved)   PT Problem List    PT Treatment Interventions     PT Goals (Current goals can be found in the Care Plan section) Acute Rehab PT Goals Patient Stated Goal: Go back to work PT Goal Formulation: All assessment and education complete, DC therapy    Frequency     Barriers to discharge        Co-evaluation               End of Session   Activity Tolerance: Patient tolerated treatment well Patient left: in bed;with call bell/phone within reach;with family/visitor present Nurse Communication: Mobility status         Time: 1729-1749 PT Time Calculation (min) (ACUTE ONLY): 20 min   Charges:   PT Evaluation $Initial PT Evaluation Tier I: 1 Procedure     PT G CodesEllouise Newer 07/07/2014, 6:32 PM  Camille Bal [place], [place]

## 2014-07-07 NOTE — ED Notes (Signed)
Attempted to call report.  Nurse was unavailable at that time and will call back.

## 2014-07-07 NOTE — ED Notes (Addendum)
Pt c/o while being driven by family to work had sudden blurred vision, slurred speech and diaphoresis. Upon arrival to ED room Dr Eulis Foster present, pt c/o tingling and pain to right arm. Symptoms began at 0830, pt last seen normal at 0829. CBG 119 in ED

## 2014-07-08 ENCOUNTER — Inpatient Hospital Stay (HOSPITAL_COMMUNITY): Payer: Self-pay

## 2014-07-08 DIAGNOSIS — M6289 Other specified disorders of muscle: Secondary | ICD-10-CM

## 2014-07-08 DIAGNOSIS — Z9114 Patient's other noncompliance with medication regimen: Secondary | ICD-10-CM | POA: Insufficient documentation

## 2014-07-08 DIAGNOSIS — G4489 Other headache syndrome: Secondary | ICD-10-CM | POA: Insufficient documentation

## 2014-07-08 DIAGNOSIS — R221 Localized swelling, mass and lump, neck: Secondary | ICD-10-CM | POA: Insufficient documentation

## 2014-07-08 DIAGNOSIS — I639 Cerebral infarction, unspecified: Secondary | ICD-10-CM

## 2014-07-08 DIAGNOSIS — Z8673 Personal history of transient ischemic attack (TIA), and cerebral infarction without residual deficits: Secondary | ICD-10-CM | POA: Insufficient documentation

## 2014-07-08 LAB — HEMOGLOBIN A1C
Hgb A1c MFr Bld: 5.9 % — ABNORMAL HIGH (ref 4.8–5.6)
Mean Plasma Glucose: 123 mg/dL

## 2014-07-08 LAB — HIV ANTIBODY (ROUTINE TESTING W REFLEX): HIV Screen 4th Generation wRfx: NONREACTIVE

## 2014-07-08 LAB — SEDIMENTATION RATE: Sed Rate: 1 mm/hr (ref 0–16)

## 2014-07-08 LAB — GLUCOSE, CAPILLARY
GLUCOSE-CAPILLARY: 100 mg/dL — AB (ref 70–99)
GLUCOSE-CAPILLARY: 89 mg/dL (ref 70–99)
Glucose-Capillary: 100 mg/dL — ABNORMAL HIGH (ref 70–99)

## 2014-07-08 LAB — TROPONIN I: Troponin I: <0.03 ng/mL (ref <0.031)

## 2014-07-08 MED ORDER — ASPIRIN 81 MG PO TBEC: 81.0000 mg | DELAYED_RELEASE_TABLET | Freq: Every day | ORAL | Status: DC

## 2014-07-08 MED ORDER — IOHEXOL 300 MG/ML  SOLN
75.0000 mL | Freq: Once | INTRAMUSCULAR | Status: AC | PRN
  Administered 2014-07-08: 75 mL via INTRAVENOUS

## 2014-07-08 MED ORDER — ACETAMINOPHEN 325 MG PO TABS: 650.0000 mg | ORAL_TABLET | ORAL | Status: DC | PRN

## 2014-07-08 NOTE — Progress Notes (Signed)
Subjective: Denies any problem today. No more vision changes, headache, numbness, weakness, dizziness. Back to his normal. No fever/chills overnight. Wants to go home.  Objective: Vital signs in last 24 hours: Filed Vitals:   07/07/14 1737 07/07/14 1800 07/08/14 0133 07/08/14 0443  BP: 145/100 158/87 130/82 132/90  Pulse: 100 70 74 74  Temp:  98.7 F (37.1 C) 98.4 F (36.9 C) 98.8 F (37.1 C)  TempSrc:  Oral Oral Oral  Resp:  15 18 18   Height:      Weight:      SpO2: 97% 98% 97% 98%   Weight change:  No intake or output data in the 24 hours ending 07/08/14 3790 Vitals reviewed. General: resting in bed, NAD HEENT: PERRL, EOMI, no scleral icterus Neck: 6 cm rounded firm neck mass on right neck, slightly mobile.  Cardiac: RRR, no rubs, murmurs or gallops Pulm: clear to auscultation bilaterally, no wheezes, rales, or rhonchi Abd: soft, nontender, nondistended, BS present Ext: warm and well perfused, no pedal edema Neuro: alert and oriented X3, cranial nerves II-XII grossly intact, strength and sensation to light touch equal in bilateral upper and lower extremities  Lab Results: Basic Metabolic Panel:  Recent Labs Lab 07/07/14 0917 07/07/14 0928  NA 136 138  K 3.9 3.9  CL 103 101  CO2 25  --   GLUCOSE 110* 112*  BUN 8 8  CREATININE 0.86 0.80  CALCIUM 8.9  --    Liver Function Tests:  Recent Labs Lab 07/07/14 0917  AST 18  ALT 11  ALKPHOS 57  BILITOT 0.6  PROT 6.2  ALBUMIN 3.3*   No results for input(s): LIPASE, AMYLASE in the last 168 hours. No results for input(s): AMMONIA in the last 168 hours. CBC:  Recent Labs Lab 07/07/14 0917 07/07/14 0928  WBC 7.4  --   NEUTROABS 3.7  --   HGB 15.4 17.0  HCT 44.9 50.0  MCV 83.1  --   PLT 312  --    Cardiac Enzymes:  Recent Labs Lab 07/07/14 1111 07/07/14 1713 07/07/14 2312  TROPONINI <0.03 <0.03 <0.03   BNP: No results for input(s): PROBNP in the last 168 hours. D-Dimer: No results for  input(s): DDIMER in the last 168 hours. CBG:  Recent Labs Lab 07/07/14 0858 07/07/14 1300 07/07/14 2122 07/08/14 0631  GLUCAP 119* 113* 100* 100*   Hemoglobin A1C:  Recent Labs Lab 07/07/14 1111  HGBA1C 5.9*   Fasting Lipid Panel: No results for input(s): CHOL, HDL, LDLCALC, TRIG, CHOLHDL, LDLDIRECT in the last 168 hours. Thyroid Function Tests:  Recent Labs Lab 07/07/14 1111  TSH 0.621   Coagulation:  Recent Labs Lab 07/07/14 0917  LABPROT 13.3  INR 1.00   Anemia Panel: No results for input(s): VITAMINB12, FOLATE, FERRITIN, TIBC, IRON, RETICCTPCT in the last 168 hours. Urine Drug Screen: Drugs of Abuse     Component Value Date/Time   LABOPIA NONE DETECTED 07/07/2014 1022   COCAINSCRNUR NONE DETECTED 07/07/2014 1022   LABBENZ NONE DETECTED 07/07/2014 1022   AMPHETMU NONE DETECTED 07/07/2014 1022   THCU POSITIVE* 07/07/2014 1022   LABBARB NONE DETECTED 07/07/2014 1022    Alcohol Level:  Recent Labs Lab 07/07/14 0917  ETH <5   Urinalysis:  Recent Labs Lab 07/07/14 1022  COLORURINE YELLOW  LABSPEC 1.016  PHURINE 5.5  GLUCOSEU NEGATIVE  HGBUR TRACE*  BILIRUBINUR NEGATIVE  KETONESUR NEGATIVE  PROTEINUR NEGATIVE  UROBILINOGEN 1.0  NITRITE NEGATIVE  LEUKOCYTESUR NEGATIVE   Misc. Labs:  [employer]  Results: No results found for this or any previous visit (from the past 240 hour(s)). Studies/Results: Ct Head Wo Contrast  07/07/2014   CLINICAL DATA:  Sudden onset blurred vision, slurred speech, and diaphoresis on the way to work this morning. Right arm tingling and pain.  EXAM: CT HEAD WITHOUT CONTRAST  TECHNIQUE: Contiguous axial images were obtained from the base of the skull through the vertex without intravenous contrast.  COMPARISON:  None.  FINDINGS: There is no evidence of acute cortical infarct, intracranial hemorrhage, mass, midline shift, or extra-axial fluid collection. Ventricles and sulci are normal.  Orbits are unremarkable. Mastoid air  cells are clear. There is mild mucosal thickening throughout the right greater than left paranasal sinuses. No skull fracture.  IMPRESSION: Unremarkable CT appearance of the brain.   Electronically Signed   By: Logan Bores   On: 07/07/2014 09:13   Mri Brain Without Contrast  07/07/2014   CLINICAL DATA:  Visual disturbance. Lightheaded. Weakness. Right arm paresthesias.  EXAM: MRI HEAD WITHOUT CONTRAST  MRA HEAD WITHOUT CONTRAST  TECHNIQUE: Multiplanar, multiecho pulse sequences of the brain and surrounding structures were obtained without intravenous contrast. Angiographic images of the head were obtained using MRA technique without contrast.  COMPARISON:  Head CT 07/07/2014  FINDINGS: MRI HEAD FINDINGS  Diffusion imaging does not show any acute or subacute infarction. The brainstem and cerebellum are normal. The cerebral hemispheres show a few punctate foci of T2 and FLAIR signal in the white matter, not likely significant. No cortical abnormality. No mass lesion, hemorrhage, hydrocephalus or extra-axial collection. No pituitary mass. Mild mucosal thickening of the paranasal sinuses. No skull or skullbase lesion.  MRA HEAD FINDINGS  Both internal carotid arteries are widely patent into the brain. The anterior and middle cerebral vessels are normal without proximal stenosis, aneurysm or vascular malformation. Large posterior communicating arteries bilaterally. Both vertebral arteries are normal with the right being dominant. No basilar stenosis. Posterior circulation branch vessels are normal.  IMPRESSION: Normal MR angiography.  No significant brain finding. Normal study except for a few punctate white matter foci not likely to be clinically significant. These can be seen in normal individuals or they can indicate the earliest manifestation of small vessel disease.   Electronically Signed   By: Nelson Chimes M.D.   On: 07/07/2014 16:45   US Soft Tissue Head/neck  07/07/2014   CLINICAL DATA:  Large palpable  neck mass present for 7 years though now larger.  EXAM: ULTRASOUND OF HEAD/NECK SOFT TISSUES  TECHNIQUE: Ultrasound examination of the head and neck soft tissues was performed in the area of clinical concern.  COMPARISON:  None.  FINDINGS: There is a mixed echogenic partially cystic, partially solid approximately 6.3 x 2.4 x 3.8 cm mass which correlates with the patient's palpable area of concern involving the right lateral neck.  Blood flow is demonstrated within this solid component of this mass (image 13).  IMPRESSION: Large (approximately 6.3 cm) complex, partially cystic, partially solid mass correlates with the patient's palpable area of concern. This complex mass remains indeterminate on this examination with differential considerations including both benign (branchial cleft cyst) and malignant (metastatic disease or adenopathy) etiologies. Further evaluation could be performed with contrast-enhanced neck CT as indicated.   Electronically Signed   By: Sandi Mariscal M.D.   On: 07/07/2014 16:51   Mr Jodene Nam Head/brain Wo Cm  07/07/2014   CLINICAL DATA:  Visual disturbance. Lightheaded. Weakness. Right arm paresthesias.  EXAM: MRI HEAD WITHOUT CONTRAST  MRA HEAD  WITHOUT CONTRAST  TECHNIQUE: Multiplanar, multiecho pulse sequences of the brain and surrounding structures were obtained without intravenous contrast. Angiographic images of the head were obtained using MRA technique without contrast.  COMPARISON:  Head CT 07/07/2014  FINDINGS: MRI HEAD FINDINGS  Diffusion imaging does not show any acute or subacute infarction. The brainstem and cerebellum are normal. The cerebral hemispheres show a few punctate foci of T2 and FLAIR signal in the white matter, not likely significant. No cortical abnormality. No mass lesion, hemorrhage, hydrocephalus or extra-axial collection. No pituitary mass. Mild mucosal thickening of the paranasal sinuses. No skull or skullbase lesion.  MRA HEAD FINDINGS  Both internal carotid arteries  are widely patent into the brain. The anterior and middle cerebral vessels are normal without proximal stenosis, aneurysm or vascular malformation. Large posterior communicating arteries bilaterally. Both vertebral arteries are normal with the right being dominant. No basilar stenosis. Posterior circulation branch vessels are normal.  IMPRESSION: Normal MR angiography.  No significant brain finding. Normal study except for a few punctate white matter foci not likely to be clinically significant. These can be seen in normal individuals or they can indicate the earliest manifestation of small vessel disease.   Electronically Signed   By: Nelson Chimes M.D.   On: 07/07/2014 16:45   Medications: I have reviewed the patient's current medications. Scheduled Meds: . aspirin EC  81 mg Oral Daily   Continuous Infusions:  PRN Meds:.acetaminophen, ondansetron (ZOFRAN) IV Assessment/Plan: Principal Problem:   Right sided weakness Active Problems:   Paresthesia   Hypertension   Mass of right side of neck  54 yo male with HTN here with CVA  CVA - likely TIA  - with paresthesia, painless vision loss, generalized weakness which all resolved after 20-30 mins. Now back to normal. CT head neg, MRI/MRA no acute changes(few punctate white matter), ECHO EF 60-65%, normal,  - hgba1c 5.9  - will check ESR to make sure vision loss is not due to vasculitis. Has no tenderness to palpation on the temporal arteries. - cont asa 52m daily.  - neuro signed off. Ok with discharge from their stand point.  Neck mass - unclear etiology concerning of hodgkin's lymphoma with weight loss and pain less minimally mobile enlarging mass [place] showed 6.3 cm complex, partially cystic, partially solid mass.  -will get CXR to evaluate for any chest lesion with possibe Hodgkin's, will get CT soft tissue neck to better characterize mass. Further workup can be done outpatient.  HTN - 130/90 - out of permissive HTN window now - may  consider starting HCTZ on outpatient if BP is high on follow up.  Dispo: Disposition is deferred at this time, awaiting improvement of current medical problems.  Anticipated discharge in approximately today(s).   The patient does have a current PCP (No primary care provider on file.) and [facility].  The patient does have transportation limitations that hinder transportation to clinic appointments.  .Services Needed at time of discharge: Y = Yes, Blank = No PT:   OT:   RN:   Equipment:   Other:     LOS: 1 day   TDellia Nims MD 07/08/2014, 8:21 AM

## 2014-07-08 NOTE — Progress Notes (Signed)
Patient is being d/c home. D/c instruction given and patient verbalized understanding. 

## 2014-07-08 NOTE — Discharge Instructions (Addendum)
You were admitted with a TIA (small stroke). You should start taking baby aspirin daily. Follow up with our clinic.  You have a mass on your neck that needs to be further evaluated. We will discuss this on follow up.  Transient Ischemic Attack A transient ischemic attack (TIA) is a "warning stroke" that causes stroke-like symptoms. Unlike a stroke, a TIA does not cause permanent damage to the brain. The symptoms of a TIA can happen very fast and do not last long. It is important to know the symptoms of a TIA and what to do. This can help prevent a major stroke or death. CAUSES   A TIA is caused by a temporary blockage in an artery in the brain or neck (carotid artery). The blockage does not allow the brain to get the blood supply it needs and can cause different symptoms. The blockage can be caused by either:  A blood clot.  Fatty buildup (plaque) in a neck or brain artery. RISK FACTORS  High blood pressure (hypertension).  High cholesterol.  Diabetes mellitus.  Heart disease.  The build up of plaque in the blood vessels (peripheral artery disease or atherosclerosis).  The build up of plaque in the blood vessels providing blood and oxygen to the brain (carotid artery stenosis).  An abnormal heart rhythm (atrial fibrillation).  Obesity.  Smoking.  Taking oral contraceptives (especially in combination with smoking).  Physical inactivity.  A diet high in fats, salt (sodium), and calories.  Alcohol use.  Use of illegal drugs (especially cocaine and methamphetamine).  Being male.  Being African American.  Being over the age of 67.  Family history of stroke.  Previous history of blood clots, stroke, TIA, or heart attack.  Sickle cell disease. SYMPTOMS  TIA symptoms are the same as a stroke but are temporary. These symptoms usually develop suddenly, or may be newly present upon awakening from sleep:  Sudden weakness or numbness of the face, arm, or leg, especially on  one side of the body.  Sudden trouble walking or difficulty moving arms or legs.  Sudden confusion.  Sudden personality changes.  Trouble speaking (aphasia) or understanding.  Difficulty swallowing.  Sudden trouble seeing in one or both eyes.  Double vision.  Dizziness.  Loss of balance or coordination.  Sudden severe headache with no known cause.  Trouble reading or writing.  Loss of bowel or bladder control.  Loss of consciousness. DIAGNOSIS  Your caregiver may be able to determine the presence or absence of a TIA based on your symptoms, history, and physical exam. Computed tomography (CT scan) of the brain is usually performed to help identify a TIA. Other tests may be done to diagnose a TIA. These tests may include:  Electrocardiography.  Continuous heart monitoring.  Echocardiography.  Carotid ultrasonography.  Magnetic resonance imaging (MRI).  A scan of the brain circulation.  Blood tests. PREVENTION  The risk of a TIA can be decreased by appropriately treating high blood pressure, high cholesterol, diabetes, heart disease, and obesity and by quitting smoking, limiting alcohol, and staying physically active. TREATMENT  Time is of the essence. Since the symptoms of TIA are the same as a stroke, it is important to seek treatment as soon as possible because you may need a medicine to dissolve the clot (thrombolytic) that cannot be given if too much time has passed. Treatment options vary. Treatment options may include rest, oxygen, intravenous (IV) fluids, and medicines to thin the blood (anticoagulants). Medicines and diet may be used  to address diabetes, high blood pressure, and other risk factors. Measures will be taken to prevent short-term and long-term complications, including infection from breathing foreign material into the lungs (aspiration pneumonia), blood clots in the legs, and falls. Treatment options include procedures to either remove plaque in the  carotid arteries or dilate carotid arteries that have narrowed due to plaque. Those procedures are:  Carotid endarterectomy.  Carotid angioplasty and stenting. HOME CARE INSTRUCTIONS   Take all medicines prescribed by your caregiver. Follow the directions carefully. Medicines may be used to control risk factors for a stroke. Be sure you understand all your medicine instructions.  You may be told to take aspirin or the anticoagulant warfarin. Warfarin needs to be taken exactly as instructed.  Taking too much or too little warfarin is dangerous. Too much warfarin increases the risk of bleeding. Too little warfarin continues to allow the risk for blood clots. While taking warfarin, you will need to have regular blood tests to measure your blood clotting time. A PT blood test measures how long it takes for blood to clot. Your PT is used to calculate another value called an INR. Your PT and INR help your caregiver to adjust your dose of warfarin. The dose can change for many reasons. It is critically important that you take warfarin exactly as prescribed.  Many foods, especially foods high in vitamin K can interfere with warfarin and affect the PT and INR. Foods high in vitamin K include spinach, kale, broccoli, cabbage, collard and turnip greens, brussels sprouts, peas, cauliflower, seaweed, and parsley as well as beef and pork liver, green tea, and soybean oil. You should eat a consistent amount of foods high in vitamin K. Avoid major changes in your diet, or notify your caregiver before changing your diet. Arrange a visit with a dietitian to answer your questions.  Many medicines can interfere with warfarin and affect the PT and INR. You must tell your caregiver about any and all medicines you take, this includes all vitamins and supplements. Be especially cautious with aspirin and anti-inflammatory medicines. Do not take or discontinue any prescribed or over-the-counter medicine except on the advice of  your caregiver or pharmacist.  Warfarin can have side effects, such as excessive bruising or bleeding. You will need to hold pressure over cuts for longer than usual. Your caregiver or pharmacist will discuss other potential side effects.  Avoid sports or activities that may cause injury or bleeding.  Be mindful when shaving, flossing your teeth, or handling sharp objects.  Alcohol can change the body's ability to handle warfarin. It is best to avoid alcoholic drinks or consume only very small amounts while taking warfarin. Notify your caregiver if you change your alcohol intake.  Notify your dentist or other caregivers before procedures.  Eat a diet that includes 5 or more servings of fruits and vegetables each day. This may reduce the risk of stroke. Certain diets may be prescribed to address high blood pressure, high cholesterol, diabetes, or obesity.  A low-sodium, low-saturated fat, low-trans fat, low-cholesterol diet is recommended to manage high blood pressure.  A low-saturated fat, low-trans fat, low-cholesterol, and high-fiber diet may control cholesterol levels.  A controlled-carbohydrate, controlled-sugar diet is recommended to manage diabetes.  A reduced-calorie, low-sodium, low-saturated fat, low-trans fat, low-cholesterol diet is recommended to manage obesity.  Maintain a healthy weight.  Stay physically active. It is recommended that you get at least 30 minutes of activity on most or all days.  Do not smoke.  Limit alcohol use even if you are not taking warfarin. Moderate alcohol use is considered to be:  No more than 2 drinks each day for men.  No more than 1 drink each day for nonpregnant women.  Stop drug abuse.  Home safety. A safe home environment is important to reduce the risk of falls. Your caregiver may arrange for specialists to evaluate your home. Having grab bars in the bedroom and bathroom is often important. Your caregiver may arrange for equipment to  be used at home, such as raised toilets and a seat for the shower.  Follow all instructions for follow-up with your caregiver. This is very important. This includes any referrals and lab tests. Proper follow up can prevent a stroke or another TIA from occurring. SEEK MEDICAL CARE IF:  You have personality changes.  You have difficulty swallowing.  You are seeing double.  You have dizziness.  You have a fever.  You have skin breakdown. SEEK IMMEDIATE MEDICAL CARE IF:  Any of these symptoms may represent a serious problem that is an emergency. Do not wait to see if the symptoms will go away. Get medical help right away. Call your local emergency services (911 in [place] Do not drive yourself to the hospital.  You have sudden weakness or numbness of the face, arm, or leg, especially on one side of the body.  You have sudden trouble walking or difficulty moving arms or legs.  You have sudden confusion.  You have trouble speaking (aphasia) or understanding.  You have sudden trouble seeing in one or both eyes.  You have a loss of balance or coordination.  You have a sudden, severe headache with no known cause.  You have new chest pain or an irregular heartbeat.  You have a partial or total loss of consciousness. MAKE SURE YOU:   Understand these instructions.  Will watch your condition.  Will get help right away if you are not doing well or get worse. Document Released: 12/04/2004 Document Revised: 03/01/2013 Document Reviewed: 06/01/2013 [facility] Patient Information 2015 [place], [place]. This information is not intended to replace advice given to you by your health care provider. Make sure you discuss any questions you have with your health care provider.

## 2014-07-08 NOTE — Progress Notes (Signed)
OT Screen Note  Patient Details Name: Daniel Weiss MRN: [id number] DOB: May 18, 1960   Cancelled Treatment:    Reason Eval/Treat Not Completed: OT screened, no needs identified, will sign off. Pt reports back to baseline. Pt able to read clock, tell date, day of week, and DOB from memory. Pt able to complete basic eye exam with [facility] oculomotor ROM, tracking, scanning and saccades. Pt able to track with single eye occulusion.  Pt reports all visual changes have resolved at this time. Pt has sign on door for independent ambulation at this time prior to OT arrival.  Peri Maris  Pager: [phone]  07/08/2014, 10:38 AM

## 2014-07-08 NOTE — Progress Notes (Signed)
Stroke Team Progress Note  HISTORY  54 y.o. male with a history of hypertension and noncompliance with treatment, presenting with intermittent dizziness over the past couple of weeks with acute onset of severe dizziness and blurred vision and tingling involving his right upper extremity at 8:30 this morning. He has no previous history of stroke nor TIA. He has not been on antiplatelet therapy. CT scan of his head showed no acute intracranial abnormality. NIH stroke score was 1 now 0. Pt back to baseline.    SUBJECTIVE No acute complaints.   OBJECTIVE Most recent Vital Signs: Filed Vitals:   07/07/14 1737 07/07/14 1800 07/08/14 0133 07/08/14 0443  BP: 145/100 158/87 130/82 132/90  Pulse: 100 70 74 74  Temp:  98.7 F (37.1 C) 98.4 F (36.9 C) 98.8 F (37.1 C)  TempSrc:  Oral Oral Oral  Resp:  15 18 18   Height:      Weight:      SpO2: 97% 98% 97% 98%   CBG (last 3)   Recent Labs  07/07/14 1300 07/07/14 2122 07/08/14 0631  GLUCAP 113* 100* 100*    IV Fluid Intake:     MEDICATIONS  . aspirin EC  81 mg Oral Daily   PRN:  acetaminophen, ondansetron (ZOFRAN) IV  Diet:     Activity:   Up with assistance  DVT Prophylaxis:  scds  CLINICALLY SIGNIFICANT STUDIES Basic Metabolic Panel:  Recent Labs Lab 07/07/14 0917 07/07/14 0928  NA 136 138  K 3.9 3.9  CL 103 101  CO2 25  --   GLUCOSE 110* 112*  BUN 8 8  CREATININE 0.86 0.80  CALCIUM 8.9  --    Liver Function Tests:  Recent Labs Lab 07/07/14 0917  AST 18  ALT 11  ALKPHOS 57  BILITOT 0.6  PROT 6.2  ALBUMIN 3.3*   CBC:  Recent Labs Lab 07/07/14 0917 07/07/14 0928  WBC 7.4  --   NEUTROABS 3.7  --   HGB 15.4 17.0  HCT 44.9 50.0  MCV 83.1  --   PLT 312  --    Coagulation:  Recent Labs Lab 07/07/14 0917  LABPROT 13.3  INR 1.00   Cardiac Enzymes:  Recent Labs Lab 07/07/14 1111 07/07/14 1713 07/07/14 2312  TROPONINI <0.03 <0.03 <0.03   Urinalysis:  Recent Labs Lab 07/07/14 1022   COLORURINE YELLOW  LABSPEC 1.016  PHURINE 5.5  GLUCOSEU NEGATIVE  HGBUR TRACE*  BILIRUBINUR NEGATIVE  KETONESUR NEGATIVE  PROTEINUR NEGATIVE  UROBILINOGEN 1.0  NITRITE NEGATIVE  LEUKOCYTESUR NEGATIVE   Lipid PanelNo results found for: CHOL, TRIG, HDL, CHOLHDL, VLDL, LDLCALC HgbA1C  Lab Results  Component Value Date   HGBA1C 5.9* 07/07/2014    Urine Drug Screen:     Component Value Date/Time   LABOPIA NONE DETECTED 07/07/2014 1022   COCAINSCRNUR NONE DETECTED 07/07/2014 1022   LABBENZ NONE DETECTED 07/07/2014 1022   AMPHETMU NONE DETECTED 07/07/2014 1022   THCU POSITIVE* 07/07/2014 1022   LABBARB NONE DETECTED 07/07/2014 1022    Alcohol Level:  Recent Labs Lab 07/07/14 0917  ETH <5    Ct Head Wo Contrast  07/07/2014   CLINICAL DATA:  Sudden onset blurred vision, slurred speech, and diaphoresis on the way to work this morning. Right arm tingling and pain.  EXAM: CT HEAD WITHOUT CONTRAST  TECHNIQUE: Contiguous axial images were obtained from the base of the skull through the vertex without intravenous contrast.  COMPARISON:  None.  FINDINGS: There is no evidence of acute  cortical infarct, intracranial hemorrhage, mass, midline shift, or extra-axial fluid collection. Ventricles and sulci are normal.  Orbits are unremarkable. Mastoid air cells are clear. There is mild mucosal thickening throughout the right greater than left paranasal sinuses. No skull fracture.  IMPRESSION: Unremarkable CT appearance of the brain.   Electronically Signed   By: Logan Bores   On: 07/07/2014 09:13   Mri Brain Without Contrast  07/07/2014   CLINICAL DATA:  Visual disturbance. Lightheaded. Weakness. Right arm paresthesias.  EXAM: MRI HEAD WITHOUT CONTRAST  MRA HEAD WITHOUT CONTRAST  TECHNIQUE: Multiplanar, multiecho pulse sequences of the brain and surrounding structures were obtained without intravenous contrast. Angiographic images of the head were obtained using MRA technique without contrast.   COMPARISON:  Head CT 07/07/2014  FINDINGS: MRI HEAD FINDINGS  Diffusion imaging does not show any acute or subacute infarction. The brainstem and cerebellum are normal. The cerebral hemispheres show a few punctate foci of T2 and FLAIR signal in the white matter, not likely significant. No cortical abnormality. No mass lesion, hemorrhage, hydrocephalus or extra-axial collection. No pituitary mass. Mild mucosal thickening of the paranasal sinuses. No skull or skullbase lesion.  MRA HEAD FINDINGS  Both internal carotid arteries are widely patent into the brain. The anterior and middle cerebral vessels are normal without proximal stenosis, aneurysm or vascular malformation. Large posterior communicating arteries bilaterally. Both vertebral arteries are normal with the right being dominant. No basilar stenosis. Posterior circulation branch vessels are normal.  IMPRESSION: Normal MR angiography.  No significant brain finding. Normal study except for a few punctate white matter foci not likely to be clinically significant. These can be seen in normal individuals or they can indicate the earliest manifestation of small vessel disease.   Electronically Signed   By: Nelson Chimes M.D.   On: 07/07/2014 16:45   US Soft Tissue Head/neck  07/07/2014   CLINICAL DATA:  Large palpable neck mass present for 7 years though now larger.  EXAM: ULTRASOUND OF HEAD/NECK SOFT TISSUES  TECHNIQUE: Ultrasound examination of the head and neck soft tissues was performed in the area of clinical concern.  COMPARISON:  None.  FINDINGS: There is a mixed echogenic partially cystic, partially solid approximately 6.3 x 2.4 x 3.8 cm mass which correlates with the patient's palpable area of concern involving the right lateral neck.  Blood flow is demonstrated within this solid component of this mass (image 13).  IMPRESSION: Large (approximately 6.3 cm) complex, partially cystic, partially solid mass correlates with the patient's palpable area of  concern. This complex mass remains indeterminate on this examination with differential considerations including both benign (branchial cleft cyst) and malignant (metastatic disease or adenopathy) etiologies. Further evaluation could be performed with contrast-enhanced neck CT as indicated.   Electronically Signed   By: Sandi Mariscal M.D.   On: 07/07/2014 16:51   Mr Jodene Nam Head/brain Wo Cm  07/07/2014   CLINICAL DATA:  Visual disturbance. Lightheaded. Weakness. Right arm paresthesias.  EXAM: MRI HEAD WITHOUT CONTRAST  MRA HEAD WITHOUT CONTRAST  TECHNIQUE: Multiplanar, multiecho pulse sequences of the brain and surrounding structures were obtained without intravenous contrast. Angiographic images of the head were obtained using MRA technique without contrast.  COMPARISON:  Head CT 07/07/2014  FINDINGS: MRI HEAD FINDINGS  Diffusion imaging does not show any acute or subacute infarction. The brainstem and cerebellum are normal. The cerebral hemispheres show a few punctate foci of T2 and FLAIR signal in the white matter, not likely significant. No cortical abnormality. No mass  lesion, hemorrhage, hydrocephalus or extra-axial collection. No pituitary mass. Mild mucosal thickening of the paranasal sinuses. No skull or skullbase lesion.  MRA HEAD FINDINGS  Both internal carotid arteries are widely patent into the brain. The anterior and middle cerebral vessels are normal without proximal stenosis, aneurysm or vascular malformation. Large posterior communicating arteries bilaterally. Both vertebral arteries are normal with the right being dominant. No basilar stenosis. Posterior circulation branch vessels are normal.  IMPRESSION: Normal MR angiography.  No significant brain finding. Normal study except for a few punctate white matter foci not likely to be clinically significant. These can be seen in normal individuals or they can indicate the earliest manifestation of small vessel disease.   Electronically Signed   By: Nelson Chimes M.D.   On: 07/07/2014 16:45    CT of the brain  No acute abnormality     MRI of the brain  No acute abnormality      MRA of the brain  Pending     2D Echocardiogram  pending  CXR    EKG  normal EKG, normal sinus rhythm, unchanged from previous tracings. For complete results please see formal report.   Therapy Recommendations pending   Physical Exam  HEENT- Normocephalic, no lesions, without obvious abnormality. Normal external eye and conjunctiva. Normal TM's bilaterally. Normal auditory canals and external ears. Normal external nose, mucus membranes and septum. Normal pharynx. Neck supple; large mass at angle of the jaw and lateral neck on the right Cardiovascular - regular rate and rhythm, S1, S2 normal, no murmur, click, rub or gallop Lungs - chest clear, no wheezing, rales, normal symmetric air entry, Heart exam - S1, S2 normal, no murmur, no gallop, rate regular Abdomen - soft, non-tender; bowel sounds normal; no masses, no organomegaly Extremities - no joint deformities, effusion, or inflammation and no edema  Neurologic Examination: Mental Status: Alert, oriented to correct age but not to current month. Speech fluent without evidence of aphasia. Able to follow commands without difficulty. Cranial Nerves: II-Visual fields were normal. III/IV/VI-Pupils were equal and reacted normally to light. Extraocular movements were full and conjugate.  V/VII-no facial numbness and no facial weakness. VIII-normal. X-normal speech and symmetrical palatal movement. XI: trapezius strength/neck flexion strength normal bilaterally XII-midline tongue extension with normal strength. Motor: 5/5 bilaterally with normal tone and bulk Sensory: Normal throughout. Deep Tendon Reflexes: 1+ and symmetric. Plantars: Mute bilaterally Cerebellar: Normal finger-to-nose testing. Carotid auscultation: Normal  ASSESSMENT Mr. Aslan Himes is a 54 y.o. male presenting with a history  of hypertension and noncompliance with treatment, presenting with intermittent dizziness over the past couple of weeks with acute onset of severe dizziness and blurred vision. Now currently back to baseline.     Hospital day # 1  TREATMENT/PLAN  Cont ASA 81  2d echo  Currently in MRA  likely d/c planning from neuro stand point  D/w family at bedside the importance of being compliant with home medications.    Will s/off please call with questions.   Leotis Pain   SIGNED    To contact Stroke Continuity provider, please refer to [email or web address] After hours, contact General Neurology

## 2014-07-08 NOTE — Discharge Summary (Signed)
Name: Daniel Weiss MRN: [id number] DOB: 1961-01-06 54 y.o. PCP: No primary care provider on file.  Date of Admission: 07/07/2014  8:52 AM Date of Discharge: 07/08/2014 Attending Physician: Sid Falcon, MD   Discharge Medications:   Medication List    STOP taking these medications        ibuprofen 200 MG tablet  Commonly known as:  ADVIL,MOTRIN      TAKE these medications        acetaminophen 325 MG tablet  Commonly known as:  TYLENOL  Take 2 tablets (650 mg total) by mouth every 4 (four) hours as needed for mild pain (temperature >/= 99.5 F).     aspirin 81 MG EC tablet  Take 1 tablet (81 mg total) by mouth daily.        Disposition and follow-up:   Mr.Daniel Weiss was discharged from [facility] in Stable condition.  At the hospital follow up visit please address:  1.  Please assess if he needs anti-hypertensive meds. His BP was borderline normal in the hospital. Consider HCTZ?  Needs to follow closely about his neck mass - talked to Dr. Redmond Baseman ENT, he recommended outpatient follow up. Please make sure he follows up with him, put in a referral if needed.  Needs neurology follow up in 6 weeks for TIA.  2.  Labs / imaging needed at time of follow-up:   3.  Pending labs/ test needing follow-up:   Follow-up Appointments: Follow-up Information    Schedule an appointment as soon as possible for a visit with [name].   Contact information:   [address] [place] [phone]      Follow up with SETHI,PRAMOD, MD. Schedule an appointment as soon as possible for a visit in 6 weeks.   Specialties:  Neurology, Radiology   Contact information:   [address] [place] [address] [phone]       Schedule an appointment as soon as possible for a visit with [name], MD.   Specialty:  Otolaryngology   Contact information:   [address] [place]  [address] [phone]       Discharge Instructions:   Consultations:    Procedures Performed:  Dg Chest 2 View  07/08/2014   CLINICAL DATA:  Stroke yesterday. Patient reports hypertension and history of smoking.  EXAM: CHEST  2 VIEW  COMPARISON:  None.  FINDINGS: The heart size and mediastinal contours are within normal limits. Both lungs are clear. The visualized skeletal structures are unremarkable.  IMPRESSION: No active cardiopulmonary disease.   Electronically Signed   By: Nolon Nations M.D.   On: 07/08/2014 12:22   Ct Head Wo Contrast  07/07/2014   CLINICAL DATA:  Sudden onset blurred vision, slurred speech, and diaphoresis on the way to work this morning. Right arm tingling and pain.  EXAM: CT HEAD WITHOUT CONTRAST  TECHNIQUE: Contiguous axial images were obtained from the base of the skull through the vertex without intravenous contrast.  COMPARISON:  None.  FINDINGS: There is no evidence of acute cortical infarct, intracranial hemorrhage, mass, midline shift, or extra-axial fluid collection. Ventricles and sulci are normal.  Orbits are unremarkable. Mastoid air cells are clear. There is mild mucosal thickening throughout the right greater than left paranasal sinuses. No skull fracture.  IMPRESSION: Unremarkable CT appearance of the brain.   Electronically Signed   By: Logan Bores   On: 07/07/2014  09:13   Ct Soft Tissue Neck W Contrast  07/08/2014   CLINICAL DATA:  Right-sided neck mass.  Present for " Some time "  EXAM: CT NECK WITH CONTRAST  TECHNIQUE: Multidetector CT imaging of the neck was performed using the standard protocol following the bolus administration of intravenous contrast.  CONTRAST:  20m OMNIPAQUE IOHEXOL 300 MG/ML  SOLN  COMPARISON:  MRI brain 07/07/2014  FINDINGS: Pharynx and larynx: No mucosal or submucosal lesion.  Salivary glands: Left parotid and submandibular glands are normal. Right submandibular gland is normal. Within the deep lobe of the parotid on the  right, there is a hyperdense mass measuring approximately 1.7 cm in diameter.  Thyroid: Normal  Lymph nodes: There is a large mass superficial to the right sternocleidomastoid muscle assume to represent nodal metastatic disease from the right parotid lesion. This measures approximately 3 x 4 x 5 cm and cannot be separated from the lateral margin of the sternocleidomastoid muscle. No other definitely enlarged nodes identified.  Vascular: Arterial and venous structures are patent. Minimal atherosclerotic disease at the carotid bifurcations.  Limited intracranial: Normal  Visualized orbits: Normal  Mastoids and visualized paranasal sinuses: Clear  Skeleton: Cervical spondylosis including chronic appearing partially calcified disc herniation at C4-5 and C5-6, possibly with significant spinal stenosis.  Upper chest: Clear  IMPRESSION: Probable 2 cm mass originating in the deep lobe of the parotid gland on the right. Large mass in the right neck superficial to the sternocleidomastoid muscle presumed to represent metastatic lymphadenopathy, measuring about 3 x 4 x 5 cm. This would be easily biopsied. 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   Electronically Signed   By: MNelson ChimesM.D.   On: 07/08/2014 11:22   Mri Brain Without Contrast  07/08/2014   ADDENDUM REPORT: 07/08/2014 11:23  ADDENDUM: Please note that some of the images depict a deep lobe parotid mass on the right with a large right neck mass presumed to represent metastatic lymphadenopathy. This is fully described on CT of the neck dated 07/08/2014.   Electronically Signed   By: MNelson ChimesM.D.   On: 07/08/2014 11:23   07/08/2014   CLINICAL DATA:  Visual disturbance. Lightheaded. Weakness. Right arm paresthesias.  EXAM: MRI HEAD WITHOUT CONTRAST  MRA HEAD WITHOUT CONTRAST  TECHNIQUE: Multiplanar, multiecho pulse sequences of the brain and surrounding structures were obtained without intravenous  contrast. Angiographic images of the head were obtained using MRA technique without contrast.  COMPARISON:  Head CT 07/07/2014  FINDINGS: MRI HEAD FINDINGS  Diffusion imaging does not show any acute or subacute infarction. The brainstem and cerebellum are normal. The cerebral hemispheres show a few punctate foci of T2 and FLAIR signal in the white matter, not likely significant. No cortical abnormality. No mass lesion, hemorrhage, hydrocephalus or extra-axial collection. No pituitary mass. Mild mucosal thickening of the paranasal sinuses. No skull or skullbase lesion.  MRA HEAD FINDINGS  Both internal carotid arteries are widely patent into the brain. The anterior and middle cerebral vessels are normal without proximal stenosis, aneurysm or vascular malformation. Large posterior communicating arteries bilaterally. Both vertebral arteries are normal with the right being dominant. No basilar stenosis. Posterior circulation branch vessels are normal.  IMPRESSION: Normal MR angiography.  No significant brain finding. Normal study except for a few punctate white matter foci not likely to be clinically significant. These can be seen in normal individuals or they can indicate the earliest manifestation of small vessel disease.  Electronically Signed: By: Nelson Chimes M.D. On: 07/07/2014 16:45   US Soft Tissue Head/neck  07/07/2014   CLINICAL DATA:  Large palpable neck mass present for 7 years though now larger.  EXAM: ULTRASOUND OF HEAD/NECK SOFT TISSUES  TECHNIQUE: Ultrasound examination of the head and neck soft tissues was performed in the area of clinical concern.  COMPARISON:  None.  FINDINGS: There is a mixed echogenic partially cystic, partially solid approximately 6.3 x 2.4 x 3.8 cm mass which correlates with the patient's palpable area of concern involving the right lateral neck.  Blood flow is demonstrated within this solid component of this mass (image 13).  IMPRESSION: Large (approximately 6.3 cm) complex,  partially cystic, partially solid mass correlates with the patient's palpable area of concern. This complex mass remains indeterminate on this examination with differential considerations including both benign (branchial cleft cyst) and malignant (metastatic disease or adenopathy) etiologies. Further evaluation could be performed with contrast-enhanced neck CT as indicated.   Electronically Signed   By: Sandi Mariscal M.D.   On: 07/07/2014 16:51   Mr Jodene Nam Head/brain Wo Cm  07/08/2014   ADDENDUM REPORT: 07/08/2014 11:23  ADDENDUM: Please note that some of the images depict a deep lobe parotid mass on the right with a large right neck mass presumed to represent metastatic lymphadenopathy. This is fully described on CT of the neck dated 07/08/2014.   Electronically Signed   By: Nelson Chimes M.D.   On: 07/08/2014 11:23   07/08/2014   CLINICAL DATA:  Visual disturbance. Lightheaded. Weakness. Right arm paresthesias.  EXAM: MRI HEAD WITHOUT CONTRAST  MRA HEAD WITHOUT CONTRAST  TECHNIQUE: Multiplanar, multiecho pulse sequences of the brain and surrounding structures were obtained without intravenous contrast. Angiographic images of the head were obtained using MRA technique without contrast.  COMPARISON:  Head CT 07/07/2014  FINDINGS: MRI HEAD FINDINGS  Diffusion imaging does not show any acute or subacute infarction. The brainstem and cerebellum are normal. The cerebral hemispheres show a few punctate foci of T2 and FLAIR signal in the white matter, not likely significant. No cortical abnormality. No mass lesion, hemorrhage, hydrocephalus or extra-axial collection. No pituitary mass. Mild mucosal thickening of the paranasal sinuses. No skull or skullbase lesion.  MRA HEAD FINDINGS  Both internal carotid arteries are widely patent into the brain. The anterior and middle cerebral vessels are normal without proximal stenosis, aneurysm or vascular malformation. Large posterior communicating arteries bilaterally. Both  vertebral arteries are normal with the right being dominant. No basilar stenosis. Posterior circulation branch vessels are normal.  IMPRESSION: Normal MR angiography.  No significant brain finding. Normal study except for a few punctate white matter foci not likely to be clinically significant. These can be seen in normal individuals or they can indicate the earliest manifestation of small vessel disease.  Electronically Signed: By: Nelson Chimes M.D. On: 07/07/2014 16:45    2D Echo:   - Left ventricle: The cavity size was normal. Wall thickness was normal. Systolic function was normal. The estimated ejection fraction was in the range of 60% to 65%.  Admission HPI:   54 yo with reported hx of HTN comes in with feeling lightheaded, loss of b/l vision (painless), generalized whole body weakness, and paresthesia of right arm. This happened as he was going to work in his car, his wife we driving, and he felt these symptoms. initiailly his vision was blurry then was completely dark for 5 minutes then came back to normal. He remained weak overall,  limping, and had right arm numbness for about 20 minutes. Now he is back to normal. Has some headache 3/10 now but no other symptoms currently.  He states his only pmh is hypertension, doesn't know baseline BP, does not take any medications. Does not have any PCP. Has not seen a doctor for a long time. He also has a mass on the right side of his neck that has been there for last 7 years. It has grown in size per patient. He never had it evaluated. It's not painful and no discharge from it. He had some weight loss (not sure how much), has been eating and drinking fine. Had some nausea intermittently for last 3 weeks, no diarrhea. No sick contacts, no fever,chills,chest pain, sob.   Hospital Course by problem list: Principal Problem:   Right sided weakness Active Problems:   Paresthesia   Hypertension   Mass of right side of neck   H/O medication  noncompliance   Other headache syndrome   Localized swelling, mass or lump of neck   Neck mass   Amaurosis fugax   54 yo male with HTN here with TIA  TIA - with paresthesia, painless vision loss, generalized weakness which all resolved after 20-30 mins. Now back to normal. CT head neg, MRI/MRA no acute changes(few punctate white matter), ECHO EF 60-65%, normal, - carotid doppler shows 1-39% stenosis bilaterally. Incidental documentation of vascular flow within the large protruding mass on the right side of neck that is also displacing the right ICA and ECA posteriorly - hgba1c 5.9  - checked ESR to make sure vision loss is not due to vasculitis - was normal. Has no tenderness to palpation on the temporal arteries. - cont asa 19m daily.  - neuro signed off. Ok with discharge from their stand point. f/up outpatient 6 weeks.   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  Initially concerning of hodgkin's lymphoma with weight loss and pain less minimally mobile enlarging mass [place] showed 6.3 cm complex, partially cystic, partially solid mass.  CT neck showed 2 cm mass from parotid gland on right, with 3x4x5cm  Possible metastatic lymphadenopathy.  - discussed with ENT Dr. BRedmond Baseman- rec outpatient follow up with him. -CXR negative.  HTN - 130/90 - out of permissive HTN window now - may consider starting HCTZ on outpatient if BP is high on follow up.  Discharge Vitals:   BP 124/82 mmHg  Pulse 86  Temp(Src) 98.1 F (36.7 C) (Oral)  Resp 20  Ht 6' (1.829 m)  Wt 145 lb 8 oz (65.998 kg)  BMI 19.73 kg/m2  SpO2 99%  Discharge Labs:  Results for orders placed or performed during the hospital encounter of 07/07/14 (from the past 24 hour(s))  Troponin I     Status: None   Collection Time: 07/07/14  5:13 PM  Result Value Ref Range   Troponin I <0.03 <0.031 ng/mL  Glucose, capillary     Status: Abnormal   Collection Time: 07/07/14  9:22 PM  Result Value Ref  Range   Glucose-Capillary 100 (H) 70 - 99 mg/dL   Comment 1 Notify RN    Comment 2 Document in Chart   Troponin I     Status: None   Collection Time: 07/07/14 11:12 PM  Result Value Ref Range   Troponin I <0.03 <0.031 ng/mL  Glucose, capillary     Status: Abnormal   Collection Time: 07/08/14  6:31 AM  Result Value Ref Range  Glucose-Capillary 100 (H) 70 - 99 mg/dL   Comment 1 Notify RN    Comment 2 Document in Chart   Sedimentation rate     Status: None   Collection Time: 07/08/14  9:04 AM  Result Value Ref Range   Sed Rate 1 0 - 16 mm/hr  Glucose, capillary     Status: Abnormal   Collection Time: 07/08/14 11:22 AM  Result Value Ref Range   Glucose-Capillary 100 (H) 70 - 99 mg/dL   Comment 1 Notify RN     Signed: Dellia Nims, MD 07/08/2014, 2:02 PM    Services Ordered on Discharge:  Equipment Ordered on Discharge:

## 2014-07-14 ENCOUNTER — Encounter: Payer: Self-pay | Admitting: Internal Medicine

## 2014-07-14 ENCOUNTER — Ambulatory Visit (INDEPENDENT_AMBULATORY_CARE_PROVIDER_SITE_OTHER): Payer: Self-pay | Admitting: Internal Medicine

## 2014-07-14 DIAGNOSIS — G453 Amaurosis fugax: Secondary | ICD-10-CM

## 2014-07-14 DIAGNOSIS — R221 Localized swelling, mass and lump, neck: Secondary | ICD-10-CM

## 2014-07-14 DIAGNOSIS — G459 Transient cerebral ischemic attack, unspecified: Secondary | ICD-10-CM

## 2014-07-14 DIAGNOSIS — I10 Essential (primary) hypertension: Secondary | ICD-10-CM

## 2014-07-14 LAB — LIPID PANEL
CHOL/HDL RATIO: 4.1 ratio
Cholesterol: 197 mg/dL (ref 0–200)
HDL: 48 mg/dL (ref >=40)
LDL CALC: 118 mg/dL — AB (ref 0–99)
Triglycerides: 156 mg/dL — ABNORMAL HIGH (ref <150)
VLDL: 31 mg/dL (ref 0–40)

## 2014-07-14 MED ORDER — HYDROCHLOROTHIAZIDE 25 MG PO TABS: 25.0000 mg | ORAL_TABLET | Freq: Every day | ORAL | Status: DC

## 2014-07-14 MED ORDER — ATORVASTATIN CALCIUM 40 MG PO TABS: 40.0000 mg | ORAL_TABLET | Freq: Every day | ORAL | Status: DC

## 2014-07-14 NOTE — Assessment & Plan Note (Signed)
TIA - all workup were neg including ECHO, MRI/MRA, carotid doppler. Was told to take asa 81mg  and f/up with neuroloy in 6 weeks. He is doing fine today. No symptoms. Continue aspirin, f/up neurology.  Checked lipid panel (was not checked in the hospital).  Accidentally sent script for lipitor 40mg  daily (asked patient not to pick it up for now unless we see elevated lipid on lab). Will call him and let him know.

## 2014-07-14 NOTE — Assessment & Plan Note (Signed)
  HTN - has hx of HTN but his BP was normal in the hospital so no meds were started. BP elevated today. 161/99. Will start on HCTZ 25mg  daily. Not sure which medication he took before.   Recheck in 1 month.

## 2014-07-14 NOTE — Assessment & Plan Note (Signed)
Neck mass - US neck showed 6.3 cm complex, partially cystic, partially solid mass. CT neck showed 2 cm mass from parotid gland on right, with 3x4x5cm Possible metastatic lymphadenopathy. We recommended following up with Dr. Redmond Baseman ENT for biopsy of this. He stated he has appt on 07/19/14 with him. Will need biopsy of the mass.

## 2014-07-14 NOTE — Progress Notes (Signed)
   Subjective:    Patient ID: Daniel Weiss, male    DOB: Apr 20, 1960, 54 y.o.   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 He is here for hospital follow up.  Doing well. No complaints.   See problem based a&p.  Review of Systems  Constitutional: Negative for fever, chills and fatigue.  HENT: Negative for sore throat, trouble swallowing and voice change.   Eyes: Negative for photophobia and visual disturbance.  Respiratory: Negative for choking, chest tightness, shortness of breath and wheezing.   Cardiovascular: Negative for chest pain, palpitations and leg swelling.  Gastrointestinal: Negative for nausea, vomiting and diarrhea.  Endocrine: Negative for polyuria.  Genitourinary: Negative for dysuria and difficulty urinating.  Musculoskeletal: Negative for back pain, neck pain and neck stiffness.  Skin: Negative.   Neurological: Negative for dizziness, syncope, facial asymmetry, weakness, numbness and headaches.  Hematological: Negative.   Psychiatric/Behavioral: Negative.        Objective:   Physical Exam  Constitutional: He is oriented to person, place, and time. He appears well-developed and well-nourished. No distress.  HENT:  Head: Normocephalic and atraumatic.  Mouth/Throat: Oropharynx is clear and moist. No oropharyngeal exudate.  Eyes: Conjunctivae are normal. Pupils are equal, round, and reactive to light. Right eye exhibits no discharge. Left eye exhibits no discharge. No scleral icterus.  Neck:  ~6-7 cm semi hard, skin colored, semi mobile mass on right neck. No Tenderness. No discharge. No signs of infection.  Cardiovascular: Normal rate, regular rhythm and normal heart sounds.  Exam reveals no gallop and no friction rub.   No murmur heard. Pulmonary/Chest: Effort normal and breath  sounds normal. No respiratory distress. He has no wheezes. He has no rales. He exhibits no tenderness.  Abdominal: Soft. Bowel sounds are normal. He exhibits no distension and no mass. There is no tenderness. There is no rebound.  Musculoskeletal: Normal range of motion. He exhibits no edema or tenderness.  Neurological: He is alert and oriented to person, place, and time. No cranial nerve deficit. Coordination normal.  5/5 str b/l upper and lower exts  Skin: He is not diaphoretic.    Filed Vitals:   07/14/14 1326  BP: 161/99  Pulse: 78  Temp: 98.1 F (36.7 C)        Assessment & Plan:  See problem based a&p.

## 2014-07-14 NOTE — Patient Instructions (Addendum)
Please follow up with Dr. Redmond Baseman for your neck mass.  Will check your cholesterol and will let you know if we need a medication. Also started you on BP medicine Hydrochlorothiazide 25mg  daily.

## 2014-07-17 NOTE — Progress Notes (Signed)
INTERNAL MEDICINE TEACHING ATTENDING ADDENDUM - Berma Harts, MD: I reviewed and discussed at the time of visit with the resident Dr. Ahmed, the patient's medical history, physical examination, diagnosis and results of pertinent tests and treatment and I agree with the patient's care as documented.  

## 2014-07-20 ENCOUNTER — Other Ambulatory Visit (HOSPITAL_COMMUNITY)
Admission: RE | Admit: 2014-07-20 | Discharge: 2014-07-20 | Disposition: A | Discharge status: Home / Self Care | Payer: MEDICAID | Source: Ambulatory Visit | Attending: Otolaryngology | Admitting: Otolaryngology

## 2014-07-20 ENCOUNTER — Other Ambulatory Visit: Payer: Self-pay | Admitting: Otolaryngology

## 2014-07-20 DIAGNOSIS — R221 Localized swelling, mass and lump, neck: Secondary | ICD-10-CM | POA: Insufficient documentation

## 2014-08-14 ENCOUNTER — Encounter: Payer: Self-pay | Admitting: Internal Medicine

## 2015-06-29 ENCOUNTER — Observation Stay (HOSPITAL_COMMUNITY)
Admission: EM | Admit: 2015-06-29 | Discharge: 2015-06-30 | Disposition: A | Discharge status: Home / Self Care | Payer: BLUE CROSS/BLUE SHIELD | Attending: Internal Medicine | Admitting: Internal Medicine

## 2015-06-29 ENCOUNTER — Encounter (HOSPITAL_COMMUNITY): Payer: Self-pay | Admitting: Emergency Medicine

## 2015-06-29 ENCOUNTER — Emergency Department (HOSPITAL_COMMUNITY): Payer: BLUE CROSS/BLUE SHIELD

## 2015-06-29 DIAGNOSIS — D119 Benign neoplasm of major salivary gland, unspecified: Secondary | ICD-10-CM | POA: Diagnosis present

## 2015-06-29 DIAGNOSIS — E785 Hyperlipidemia, unspecified: Secondary | ICD-10-CM | POA: Insufficient documentation

## 2015-06-29 DIAGNOSIS — I10 Essential (primary) hypertension: Secondary | ICD-10-CM | POA: Diagnosis not present

## 2015-06-29 DIAGNOSIS — Z7982 Long term (current) use of aspirin: Secondary | ICD-10-CM | POA: Insufficient documentation

## 2015-06-29 DIAGNOSIS — R531 Weakness: Secondary | ICD-10-CM | POA: Diagnosis not present

## 2015-06-29 DIAGNOSIS — N179 Acute kidney failure, unspecified: Secondary | ICD-10-CM | POA: Diagnosis present

## 2015-06-29 DIAGNOSIS — G459 Transient cerebral ischemic attack, unspecified: Secondary | ICD-10-CM

## 2015-06-29 DIAGNOSIS — E860 Dehydration: Secondary | ICD-10-CM | POA: Diagnosis present

## 2015-06-29 DIAGNOSIS — R200 Anesthesia of skin: Secondary | ICD-10-CM | POA: Diagnosis not present

## 2015-06-29 DIAGNOSIS — Z8673 Personal history of transient ischemic attack (TIA), and cerebral infarction without residual deficits: Secondary | ICD-10-CM | POA: Diagnosis not present

## 2015-06-29 DIAGNOSIS — I4581 Long QT syndrome: Secondary | ICD-10-CM | POA: Insufficient documentation

## 2015-06-29 DIAGNOSIS — D110 Benign neoplasm of parotid gland: Secondary | ICD-10-CM | POA: Insufficient documentation

## 2015-06-29 DIAGNOSIS — Z79899 Other long term (current) drug therapy: Secondary | ICD-10-CM | POA: Diagnosis not present

## 2015-06-29 DIAGNOSIS — D751 Secondary polycythemia: Secondary | ICD-10-CM | POA: Diagnosis not present

## 2015-06-29 DIAGNOSIS — F17210 Nicotine dependence, cigarettes, uncomplicated: Secondary | ICD-10-CM | POA: Insufficient documentation

## 2015-06-29 DIAGNOSIS — E871 Hypo-osmolality and hyponatremia: Secondary | ICD-10-CM | POA: Diagnosis present

## 2015-06-29 DIAGNOSIS — R9431 Abnormal electrocardiogram [ECG] [EKG]: Secondary | ICD-10-CM | POA: Diagnosis present

## 2015-06-29 DIAGNOSIS — I639 Cerebral infarction, unspecified: Secondary | ICD-10-CM

## 2015-06-29 DIAGNOSIS — R221 Localized swelling, mass and lump, neck: Secondary | ICD-10-CM | POA: Diagnosis present

## 2015-06-29 DIAGNOSIS — I674 Hypertensive encephalopathy: Secondary | ICD-10-CM | POA: Insufficient documentation

## 2015-06-29 DIAGNOSIS — G458 Other transient cerebral ischemic attacks and related syndromes: Secondary | ICD-10-CM

## 2015-06-29 HISTORY — DX: Hypo-osmolality and hyponatremia: E87.1

## 2015-06-29 HISTORY — DX: Acute kidney failure, unspecified: N17.9

## 2015-06-29 LAB — CBC
HCT: 44.8 % (ref 39.0–52.0)
Hemoglobin: 15.2 g/dL (ref 13.0–17.0)
MCH: 27 pg (ref 26.0–34.0)
MCHC: 33.9 g/dL (ref 30.0–36.0)
MCV: 79.6 fL (ref 78.0–100.0)
PLATELETS: 285 10*3/uL (ref 150–400)
RBC: 5.63 MIL/uL (ref 4.22–5.81)
RDW: 16.6 % — ABNORMAL HIGH (ref 11.5–15.5)
WBC: 6.2 10*3/uL (ref 4.0–10.5)

## 2015-06-29 LAB — URINALYSIS, ROUTINE W REFLEX MICROSCOPIC
BILIRUBIN URINE: NEGATIVE
GLUCOSE, UA: NEGATIVE mg/dL
Ketones, ur: 15 mg/dL — AB
Leukocytes, UA: NEGATIVE
Nitrite: NEGATIVE
Protein, ur: NEGATIVE mg/dL
SPECIFIC GRAVITY, URINE: 1.018 (ref 1.005–1.030)
pH: 5.5 (ref 5.0–8.0)

## 2015-06-29 LAB — I-STAT CHEM 8, ED
BUN: 22 mg/dL — ABNORMAL HIGH (ref 6–20)
CHLORIDE: 95 mmol/L — AB (ref 101–111)
Calcium, Ion: 1.07 mmol/L — ABNORMAL LOW (ref 1.12–1.23)
Creatinine, Ser: 1.3 mg/dL — ABNORMAL HIGH (ref 0.61–1.24)
GLUCOSE: 96 mg/dL (ref 65–99)
HEMATOCRIT: 56 % — AB (ref 39.0–52.0)
HEMOGLOBIN: 19 g/dL — AB (ref 13.0–17.0)
POTASSIUM: 3.8 mmol/L (ref 3.5–5.1)
SODIUM: 131 mmol/L — AB (ref 135–145)
TCO2: 23 mmol/L (ref 0–100)

## 2015-06-29 LAB — PROTIME-INR
INR: 1 (ref 0.00–1.49)
PROTHROMBIN TIME: 13.4 s (ref 11.6–15.2)

## 2015-06-29 LAB — COMPREHENSIVE METABOLIC PANEL
ALBUMIN: 3.8 g/dL (ref 3.5–5.0)
ALK PHOS: 59 U/L (ref 38–126)
ALT: 14 U/L — ABNORMAL LOW (ref 17–63)
ANION GAP: 14 (ref 5–15)
AST: 27 U/L (ref 15–41)
BILIRUBIN TOTAL: 0.8 mg/dL (ref 0.3–1.2)
BUN: 18 mg/dL (ref 6–20)
CALCIUM: 9.2 mg/dL (ref 8.9–10.3)
CO2: 23 mmol/L (ref 22–32)
Chloride: 94 mmol/L — ABNORMAL LOW (ref 101–111)
Creatinine, Ser: 1.38 mg/dL — ABNORMAL HIGH (ref 0.61–1.24)
GFR calc non Af Amer: 56 mL/min — ABNORMAL LOW (ref >60)
GLUCOSE: 103 mg/dL — AB (ref 65–99)
POTASSIUM: 3.9 mmol/L (ref 3.5–5.1)
SODIUM: 131 mmol/L — AB (ref 135–145)
TOTAL PROTEIN: 6.9 g/dL (ref 6.5–8.1)

## 2015-06-29 LAB — DIFFERENTIAL
Basophils Absolute: 0 10*3/uL (ref 0.0–0.1)
Basophils Relative: 0 %
EOS ABS: 0 10*3/uL (ref 0.0–0.7)
EOS PCT: 0 %
LYMPHS PCT: 34 %
Lymphs Abs: 2 10*3/uL (ref 0.7–4.0)
Monocytes Absolute: 0.8 10*3/uL (ref 0.1–1.0)
Monocytes Relative: 14 %
Neutro Abs: 3.1 10*3/uL (ref 1.7–7.7)
Neutrophils Relative %: 52 %

## 2015-06-29 LAB — TROPONIN I

## 2015-06-29 LAB — URINE MICROSCOPIC-ADD ON

## 2015-06-29 LAB — RAPID URINE DRUG SCREEN, HOSP PERFORMED
AMPHETAMINES: NOT DETECTED
BENZODIAZEPINES: NOT DETECTED
Barbiturates: NOT DETECTED
COCAINE: NOT DETECTED
Opiates: NOT DETECTED
Tetrahydrocannabinol: POSITIVE — AB

## 2015-06-29 LAB — ETHANOL

## 2015-06-29 LAB — I-STAT TROPONIN, ED: TROPONIN I, POC: 0.01 ng/mL (ref 0.00–0.08)

## 2015-06-29 LAB — APTT: APTT: 29 s (ref 24–37)

## 2015-06-29 MED ORDER — ATORVASTATIN CALCIUM 40 MG PO TABS
40.0000 mg | ORAL_TABLET | Freq: Every day | ORAL | Status: DC
  Administered 2015-06-29: 40 mg via ORAL
  Filled 2015-06-29: qty 1

## 2015-06-29 MED ORDER — ACETAMINOPHEN 650 MG RE SUPP: 650.0000 mg | RECTAL | Status: DC | PRN

## 2015-06-29 MED ORDER — ASPIRIN 300 MG RE SUPP: 300.0000 mg | Freq: Every day | RECTAL | Status: DC

## 2015-06-29 MED ORDER — SODIUM CHLORIDE 0.9 % IV BOLUS (SEPSIS)
1000.0000 mL | Freq: Once | INTRAVENOUS | Status: AC
  Administered 2015-06-29: 1000 mL via INTRAVENOUS

## 2015-06-29 MED ORDER — ACETAMINOPHEN 325 MG PO TABS: 650.0000 mg | ORAL_TABLET | ORAL | Status: DC | PRN

## 2015-06-29 MED ORDER — HYDRALAZINE HCL 20 MG/ML IJ SOLN: 10.0000 mg | INTRAMUSCULAR | Status: DC | PRN

## 2015-06-29 MED ORDER — ASPIRIN 325 MG PO TABS
325.0000 mg | ORAL_TABLET | Freq: Every day | ORAL | Status: DC
  Administered 2015-06-29 – 2015-06-30 (×2): 325 mg via ORAL
  Filled 2015-06-29 (×2): qty 1

## 2015-06-29 MED ORDER — SODIUM CHLORIDE 0.9 % IV SOLN
INTRAVENOUS | Status: DC
  Administered 2015-06-29: 23:00:00 via INTRAVENOUS

## 2015-06-29 MED ORDER — STROKE: EARLY STAGES OF RECOVERY BOOK
Freq: Once | Status: AC
  Administered 2015-06-30: 04:00:00
  Filled 2015-06-29: qty 1

## 2015-06-29 NOTE — Consult Note (Addendum)
Neurology Consultation Reason for Consult: Neck mass and transient neurological symptoms Referring Physician: IM attending  CC: as above  History is obtained from:patient  HPI: Daniel Weiss is a 55 y.o. male with hx of left neck mass, which has been followed by surgery as outpatient.  It is considered to be benign I am told.  However, it has gotten larger since the last time the patient saw his surgeon I am told.  The patient also suffers from HTN and missed some doses of his medicine in the last couople of days.  In this setting he presents now to the ED with sensation of right arm numbness tingling and clumsiness which lasted an entire 48h period.  He also had minor left hand numbness.  At the time this started he was actually feeling lightheaded and dyaphoretic.  His main concern is that this felt a lot like a time in the past when he suffered a stroke and had similar symptoms.  In the ED he was found to have uncontrolled HTN.  Because of his neck mass, I am called to investigate whether this mass has something to do with his neurological symptoms given its proximity to vascular structures in the neck.  ROS: A 14 point ROS was performed and is negative except as noted in the HPI.  Past Medical History  Diagnosis Date  . Hypertension     Family History  Problem Relation Age of Onset  . Stroke Mother   . Hypertension Mother   . Diabetes type II Mother   . Leukemia Other   . Breast cancer Other   . CAD Neg Hx     Social History:  reports that he has been smoking Cigarettes.  He has been smoking about 0.25 packs per day. He does not have any smokeless tobacco history on file. He reports that he uses illicit drugs (Marijuana). He reports that he does not drink alcohol.  Exam: Current vital signs: BP 156/116 mmHg  Pulse 87  Temp(Src) 97.6 F (36.4 C) (Oral)  Resp 18  SpO2 100% Vital signs in last 24 hours: Temp:  [97.6 F (36.4 C)] 97.6 F (36.4 C) (04/21 1453) Pulse Rate:   [77-99] 87 (04/21 2000) Resp:  [15-22] 18 (04/21 2000) BP: (156-185)/(107-128) 156/116 mmHg (04/21 2000) SpO2:  [97 %-100 %] 100 % (04/21 2000)   Physical Exam  Constitutional: Appears well-developed and well-nourished. There is a large right sided neck mass, poor dentition Psych: Affect appropriate to situation Eyes: No scleral injection HENT: No OP obstrucion Head: Normocephalic.  Cardiovascular: Normal rate and regular rhythm.  Respiratory: Effort normal and breath sounds normal to anterior ascultation GI: Soft.  No distension. There is no tenderness.  Skin: WDI  Neuro: Mental Status: Patient is awake, alert, oriented to person, place, month, year, and situation Patient is able to give a clear and coherent history. No signs of aphasia or neglect Cranial Nerves: II: Visual Fields are full. Pupils are equal, round, and reactive to light. III,IV, VI: EOMI without ptosis or diploplia.  V: Facial sensation is symmetric to temperature VII: Facial movement is symmetric.  VIII: hearing is intact to voice X: Uvula elevates symmetrically XI: Shoulder shrug is symmetric. XII: tongue is midline without atrophy or fasciculations.  Motor: Tone is normal. Bulk is normal. 5/5 strength was present in all four extremities. Sensory: Sensation is symmetric to light touch and temperature in the arms and legs Deep Tendon Reflexes: 2+ and symmetric in the biceps and patellae. Plantars: Toes  are downgoing bilaterally. Cerebellar: FNF and HKS are intact bilaterally    I have reviewed labs in epic and the results pertinent to this consultation are: hemoconcentrated, minor degree of ARF  I have reviewed the images obtained: CY head is negative  Impression: Possible small stroke on pt with several stroke risk factors.  The symptoms do not correlate with a vascular issue affecting the right carotid as his sx were in the right arm.  Would recommend repeating the MRIsoft tissue neck with contrast  once the patient is hydrated and if there is concern for a vascular issue on the right side then a MRA neck might be helpful as well as the carotid ultrasound is probably going to be limited.  If this test is ordered then pt will not need carotid doppler.  For the time being I would not change the patient's home meds. Continue asa.  Will order echo and lipid panel for now. lovenox for dvt prophylaxis.  Stroke attending will continue to follow pt after tomorrow morning.  Recommendations: 1) as above

## 2015-06-29 NOTE — ED Notes (Signed)
Patient transported to X-ray 

## 2015-06-29 NOTE — ED Notes (Signed)
Pt ambulated to restroom. 

## 2015-06-29 NOTE — ED Notes (Signed)
Attempted report x 2 

## 2015-06-29 NOTE — ED Provider Notes (Signed)
CSN: [id number]     Arrival date & time 06/29/15  1443 History   None    Chief Complaint  Patient presents with  . Numbness     (Consider location/radiation/quality/duration/timing/severity/associated sxs/prior Treatment) HPI 55 y.o. male with a history of HTN and TIA in April 2016 during which he experienced bilateral arm numbness, and amaurosis fugax, presents to the emergency department noting return of his bilateral arm numbness symptoms though predominantly in his right upper extremity which started yesterday morning persisted until the evening when it spontaneously resolved. He didn't noted return of symptoms this morning for a few hours and then abated while in route to the hospital by EMS. Currently he denies any symptoms. Did state that yesterday during the evening he had some associated nausea and vomiting nonbloody emesis 3. He states that his symptoms were associated with a tingling sensation that feels as though his arm was asleep and was weaker than normal. His tingling sensations radiated up into his right anterior chest. The symptoms were not associated with movement or position, and were not associated with any neck pain. He did note a mild left sided headache during the symptoms which she states has been waxing and waning since the onset but was alleviated with OTC pain medications. He denies any associated vision changes, vision loss, photophobia. Denies any history of migraines. He denies any history of prior CVAs of than his previous TIA last year. He denies any sensation of significant anxiety during these episodes, nor significant chest pain or pressure. He states that his right-sided neck tumor was evaluated during his previous admission and was found to be benign by the ENT physician with whom he spoke last year. He was told that he could have it surgically removed but did not need it emergently removed. Given financial constraints he chose to forego the operation.   Past  Medical History  Diagnosis Date  . Hypertension    History reviewed. No pertinent past surgical history. Family History  Problem Relation Age of Onset  . Stroke Mother   . Hypertension Mother   . Diabetes type II Mother   . Leukemia Other   . Breast cancer Other   . CAD Neg Hx    Social History  Substance Use Topics  . Smoking status: Current Every Day Smoker -- 0.25 packs/day    Types: Cigarettes  . Smokeless tobacco: None  . Alcohol Use: No     Comment: 2 Beers daily.    Review of Systems  Constitutional: Negative for fever and chills.  HENT: Negative for congestion, rhinorrhea and sinus pressure.   Eyes: Negative for pain and visual disturbance.  Respiratory: Negative for cough, chest tightness and shortness of breath.   Cardiovascular: Negative for chest pain and palpitations.  Gastrointestinal: Positive for nausea and vomiting. Negative for abdominal pain and diarrhea.  Musculoskeletal: Negative for back pain, neck pain and neck stiffness.  Skin: Negative for rash and wound.  Neurological: Positive for facial asymmetry (his friend at work said he had a subtle twitching his one side of his face but pt denied any sig facial droop), weakness, numbness and headaches. Negative for dizziness, tremors, seizures, syncope, speech difficulty and light-headedness.  Psychiatric/Behavioral: Negative for confusion.  All other systems reviewed and are negative.     Allergies  Review of patient's allergies indicates no known allergies.  Home Medications   Prior to Admission medications   Medication Sig Start Date End Date Taking? Authorizing Provider  acetaminophen (TYLENOL) 325 MG  tablet Take 2 tablets (650 mg total) by mouth every 4 (four) hours as needed for mild pain (temperature >/= 99.5 F). 07/08/14  Yes Tasrif Ahmed, MD  aspirin EC 81 MG EC tablet Take 1 tablet (81 mg total) by mouth daily. 07/08/14  Yes Tasrif Ahmed, MD  atorvastatin (LIPITOR) 40 MG tablet Take 1 tablet (40  mg total) by mouth daily. 07/14/14 07/14/15 Yes Tasrif Ahmed, MD  hydrochlorothiazide (HYDRODIURIL) 25 MG tablet Take 1 tablet (25 mg total) by mouth daily. 07/14/14  Yes Tasrif Ahmed, MD   BP 132/73 mmHg  Pulse 77  Temp(Src) 98.5 F (36.9 C) (Oral)  Resp 18  Ht 6' (1.829 m)  Wt 63.05 kg  BMI 18.85 kg/m2  SpO2 100% Physical Exam  Constitutional: He is oriented to person, place, and time. He appears well-developed and well-nourished. No distress.  Thin slightly cachectic appearing  HENT:  Head: Normocephalic and atraumatic.    Right Ear: External ear normal.  Left Ear: External ear normal.  Nose: Nose normal.  Mouth/Throat: Oropharynx is clear and moist.  Right neck mass, present x 7-8 years, approximately 10cm x 5cm. Non-tender, non-erythematous, non-pulsatile. Reportedly benign on evaluation by ENT last year. Reportedly stable in size per pt.   Eyes: Conjunctivae and EOM are normal. Pupils are equal, round, and reactive to light.  Neck: Normal range of motion. Neck supple.  Cardiovascular: Normal rate, regular rhythm, normal heart sounds and intact distal pulses.   Pulmonary/Chest: Effort normal and breath sounds normal. He exhibits no tenderness.  Abdominal: Soft. He exhibits no distension. There is no tenderness.  Musculoskeletal: He exhibits no tenderness.  Neurological: He is alert and oriented to person, place, and time. He has normal strength. No cranial nerve deficit or sensory deficit. Coordination normal. GCS eye subscore is 4. GCS verbal subscore is 5. GCS motor subscore is 6.  Intact finger to nose and heel to shin normal gait.   Skin: Skin is warm and dry. No rash noted. He is not diaphoretic.  Nursing note and vitals reviewed.   ED Course  Procedures (including critical care time) Labs Review Labs Reviewed  CBC - Abnormal; Notable for the following:    RDW 16.6 (*)    All other components within normal limits  COMPREHENSIVE METABOLIC PANEL - Abnormal; Notable for  the following:    Sodium 131 (*)    Chloride 94 (*)    Glucose, Bld 103 (*)    Creatinine, Ser 1.38 (*)    ALT 14 (*)    GFR calc non Af Amer 56 (*)    All other components within normal limits  URINE RAPID DRUG SCREEN, HOSP PERFORMED - Abnormal; Notable for the following:    Tetrahydrocannabinol POSITIVE (*)    All other components within normal limits  URINALYSIS, ROUTINE W REFLEX MICROSCOPIC (NOT AT [facility]) - Abnormal; Notable for the following:    APPearance CLOUDY (*)    Hgb urine dipstick SMALL (*)    Ketones, ur 15 (*)    All other components within normal limits  URINE MICROSCOPIC-ADD ON - Abnormal; Notable for the following:    Squamous Epithelial / LPF 0-5 (*)    Bacteria, UA RARE (*)    Casts HYALINE CASTS (*)    All other components within normal limits  I-STAT CHEM 8, ED - Abnormal; Notable for the following:    Sodium 131 (*)    Chloride 95 (*)    BUN 22 (*)    Creatinine, Ser 1.30 (*)  Calcium, Ion 1.07 (*)    Hemoglobin 19.0 (*)    HCT 56.0 (*)    All other components within normal limits  ETHANOL  PROTIME-INR  APTT  DIFFERENTIAL  TROPONIN I  TROPONIN I  TROPONIN I  HEMOGLOBIN A1C  TROPONIN I  TROPONIN I  LIPID PANEL  I-STAT TROPOININ, ED  Randolm Idol, ED    Imaging Review Dg Chest 2 View  06/29/2015  CLINICAL DATA:  55 year old male with a history of bilateral arm numbness. EXAM: CHEST - 2 VIEW COMPARISON:  07/08/2014 FINDINGS: Cardiomediastinal silhouette projects within normal limits in size and contour. No confluent airspace disease, pneumothorax, or pleural effusion. No displaced fracture. Unremarkable appearance of the upper abdomen. IMPRESSION: No radiographic evidence of acute cardiopulmonary disease. Signed, Dulcy Fanny. Earleen Newport, DO [facility] Electronically Signed   By: Corrie Mckusick D.O.   On: 06/29/2015 15:31   Ct Head Wo Contrast  06/29/2015  CLINICAL DATA:  Headache and  dizziness. Right sided weakness starting yesterday. Hypertension. Right upper extremity paresthesias. Recent diagnosis of right neck mass along the sternocleidomastoid and the portion of the parotid gland. EXAM: CT HEAD WITHOUT CONTRAST TECHNIQUE: Contiguous axial images were obtained from the base of the skull through the vertex without intravenous contrast. COMPARISON:  Multiple exams, including 07/07/2014 FINDINGS: The brainstem, cerebellum, cerebral peduncles, thalami, basal ganglia, basilar cisterns, and ventricular system appear within normal limits. No intracranial hemorrhage, mass lesion, or acute CVA. The vertical level of the mass along the deep right parotid gland is not part of today' s head CT. Visualized paranasal sinuses appear clear. No middle ear fluid or mastoid effusion. IMPRESSION: 1. No significant intracranial abnormality is identified. Electronically Signed   By: Van Clines M.D.   On: 06/29/2015 17:45   I have personally reviewed and evaluated these images and lab results as part of my medical decision-making.   EKG Interpretation   Date/Time:  Friday June 29 2015 14:53:22 EDT Ventricular Rate:  99 PR Interval:  138 QRS Duration: 137 QT Interval:  390 QTC Calculation: 500 R Axis:   90 Text Interpretation:  Sinus rhythm Left bundle branch block Abnormal ekg  Confirmed by Carmin Muskrat  MD [phone]) on 06/29/2015 4:48:59 PM      MDM  55 y.o. male with a hx of benign neck mass and hx of prior TIA in 4/16, presents to the ED noting similar UE paresthesias to his previous TIA which started yesterday then have been waxing and waning since, currently asymptomatic sx resolved while en route to the hospital. On exam he is neurologically intact, as above with no focal deficits noted. Physical exam otherwise is significant for large right lateral neck mass but no neck pain. Neg spurling's, no radicular sx. Labs returned showing AKI of Cr of 1.38, but otherwise reassuring.  Given 2L of IV fluids. CT head was done and showed no acute intracranial abnormality. His case was discussed with neurology who recommended further TIA workup. He was then admitted to the hospitalist for further care and assessment.   Final diagnoses:  Transient cerebral ischemia, unspecified transient cerebral ischemia type      Zenovia Jarred, DO 06/30/15 0104  Carmin Muskrat, MD 07/01/15 2236

## 2015-06-29 NOTE — ED Notes (Signed)
Per [facility] patient complains of bilateral arm numbness yesterday and this morning that has resolved, but currently feels like his right arm is cramping.  Patient able to perform full range of motion without difficulty with both arms.  Per [facility] patient's coworker that patient had some facial droop.  Patient has a what he states is a benign tumor on the right side of his neck.  Patient states the tumor has been assessed and is determined to be benign but that it was over an artery and should be removed.  Patient is alert and oriented at this time.

## 2015-06-29 NOTE — ED Notes (Signed)
Attempted report 

## 2015-06-29 NOTE — H&P (Addendum)
[name] [id number] DOB: Sep 07, 1960 DOA: 06/29/2015   Referring MD Ronnald Ramp PCP: No primary care provider on file.    Outpatient Specialists: [name] Patient coming from: home  Chief Complaint: right side weakness and numbness  HPI: Daniel Weiss is a 56 y.o. male with medical history significant of TIA in 2016, HTN and Warhin's tumor     Presented with bilateral numbness of upper extremities worsened right happened yesterday lasted all day form 9:30 in AM. Route patient had another episode this morning He felt well enough to go to work. He walked up a hill and felt funny. Had the same tingling, his coworker suspect that he may have had some facial droop. Tingling was radiating to his chest and felt like his arm was falling asleep. Reports his arm was cramping. Not improved with movement with changing of position. This was associated with mild headache worse in the left being coming and going but he took some Tylenol for it and make better no blurred vision or photophobia. Symptoms have resolved by time patient presented to emergency department patient endorses some mild nausea and vomiting associated this 2 days ago had  24 h of diarrhea. He is feeling better now, no chest pain no shortness of breath, no fever no chills. No cough. Reports  feeling light headed when he standing up. No sick contacts. He works as Training and development officer.    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  ER course: CT scan of the head show no evidence of CVA. Chest x-ray show no evidence of cardiopulmonary disease creatinine noted to be elevated 1.38 she noted to be hypertensive 170/114 He was given boluses of normal saline 2 L   admission for TIA, dehydration  Review of Systems:    Pertinent positives include: headaches, tingling,  weakness,  Constitutional:  No weight loss, night sweats, Fevers, chills, fatigue, weight loss  HEENT:  No  Difficulty swallowing,Tooth/dental problems,Sore throat,  No sneezing, itching, ear ache, nasal congestion, post nasal drip,  Cardio-vascular:  No chest pain, Orthopnea, PND, anasarca, dizziness, palpitations.no Bilateral lower extremity swelling  GI:  No heartburn, indigestion, abdominal pain, nausea, vomiting, diarrhea, change in bowel habits, loss of appetite, melena, blood in stool, hematemesis Resp:  no shortness of breath at rest. No dyspnea on exertion, No excess mucus, no productive cough, No non-productive cough, No coughing up of blood.No change in color of mucus.No wheezing. Skin:  no rash or lesions. No jaundice GU:  no dysuria, change in color of urine, no urgency or frequency. No straining to urinate.  No flank pain.  Musculoskeletal:  No joint pain or no joint swelling. No decreased range of motion. No back pain.  Psych:  No change in mood or affect. No depression or anxiety. No memory loss.  Neuro:   no double vision, no gait abnormality, no slurred speech, no confusion  As per HPI otherwise 10 point  review of systems negative.   Past Medical History: Past Medical History  Diagnosis Date  . Hypertension    History reviewed. No pertinent past surgical history.   Social History:  Ambulatory  independently   Lives at home  With family     reports that he has been smoking Cigarettes.  He has been smoking about 0.25 packs per day. He does not  have any smokeless tobacco history on file. He reports that he drinks alcohol. He reports that he uses illicit drugs (Marijuana).  Allergies:  No Known Allergies     Family History: Family History  Problem Relation Age of Onset  . Stroke Mother   . Hypertension Mother   . Diabetes type II Mother   . Leukemia Other   . Breast cancer Other   . CAD Neg Hx     Medications: Prior to Admission medications   Medication Sig Start Date End Date Taking? Authorizing Provider  acetaminophen (TYLENOL) 325 MG tablet Take 2 tablets (650 mg total) by mouth every 4 (four) hours as needed for mild pain (temperature >/= 99.5 F). 07/08/14  Yes Tasrif Ahmed, MD  aspirin EC 81 MG EC tablet Take 1 tablet (81 mg total) by mouth daily. 07/08/14  Yes Tasrif Ahmed, MD  atorvastatin (LIPITOR) 40 MG tablet Take 1 tablet (40 mg total) by mouth daily. 07/14/14 07/14/15 Yes Tasrif Ahmed, MD  hydrochlorothiazide (HYDRODIURIL) 25 MG tablet Take 1 tablet (25 mg total) by mouth daily. 07/14/14  Yes Dellia Nims, MD    Physical Exam: Patient Vitals for the past 24 hrs:  BP Temp Temp src Pulse Resp SpO2  06/29/15 1800 (!) 185/115 mmHg - - 77 17 100 %  06/29/15 1730 (!) 184/107 mmHg - - 88 15 97 %  06/29/15 1500 (!) 183/128 mmHg - - 99 19 98 %  06/29/15 1453 (!) 174/122 mmHg 97.6 F (36.4 C) Oral 95 19 97 %    1. General:  in No Acute distress 2. Psychological: Alert and Oriented 3. Head/ENT:     Dry Mucous Membranes                          Head Non traumatic, neck supple, large firm mass on the right of the neck                           Poor Dentition 4. SKIN:  decreased Skin turgor,  Skin clean Dry and intact no rash 5. Heart: Regular rate and rhythm no Murmur, Rub or gallop 6. Lungs: Clear to auscultation bilaterally, no wheezes or crackles   7. Abdomen: Soft, non-tender, Non distended 8. Lower extremities: no clubbing, cyanosis, or edema 9. Neurologically Strength 5 out of 5 in  All 4 extremities cranial  nerves intact 10. MSK: Normal range of motion   body mass index is unknown because there is no weight on file.  Labs on Admission:   Labs on Admission: I have personally reviewed following labs and imaging studies  CBC:  Recent Labs Lab 06/29/15 1503 06/29/15 1537 06/29/15 1539  WBC  --  6.2  --   NEUTROABS 3.1  --   --   HGB  --  15.2 19.0*  HCT  --  44.8 56.0*  MCV  --  79.6  --   PLT  --  285  --    Basic Metabolic Panel:  Recent Labs Lab 06/29/15 1503  06/29/15 1539  NA 131* 131*  K 3.9 3.8  CL 94* 95*  CO2 23  --   GLUCOSE 103* 96  BUN 18 22*  CREATININE 1.38* 1.30*  CALCIUM 9.2  --    GFR: CrCl cannot be calculated (Unknown ideal weight.). Liver Function Tests:  Recent Labs Lab 06/29/15 1503  AST 27  ALT 14*  ALKPHOS 59  BILITOT 0.8  PROT 6.9  ALBUMIN 3.8   No results for input(s): LIPASE, AMYLASE in the last 168 hours. No results for input(s): AMMONIA in the last 168 hours. Coagulation Profile:  Recent Labs Lab 06/29/15 1503  INR 1.00   Cardiac Enzymes: No results for input(s): CKTOTAL, CKMB, CKMBINDEX, TROPONINI in the last 168 hours. BNP (last 3 results) No results for input(s): PROBNP in the last 8760 hours. HbA1C: No results for input(s): HGBA1C in the last 72 hours. CBG: No results for input(s): GLUCAP in the last 168 hours. Lipid Profile: No results for input(s): CHOL, HDL, LDLCALC, TRIG, CHOLHDL, LDLDIRECT in the last 72 hours. Thyroid Function Tests: No results for input(s): TSH, T4TOTAL, FREET4, T3FREE, THYROIDAB in the last 72 hours. Anemia Panel: No results for input(s): VITAMINB12, FOLATE, FERRITIN, TIBC, IRON, RETICCTPCT in the last 72 hours. Urine analysis:    Component Value Date/Time   COLORURINE YELLOW 06/29/2015 1649   APPEARANCEUR CLOUDY* 06/29/2015 1649   LABSPEC 1.018 06/29/2015 1649   PHURINE 5.5 06/29/2015 1649   GLUCOSEU NEGATIVE 06/29/2015 1649   HGBUR SMALL* 06/29/2015 1649   BILIRUBINUR NEGATIVE  06/29/2015 1649   KETONESUR 15* 06/29/2015 1649   PROTEINUR NEGATIVE 06/29/2015 1649   UROBILINOGEN 1.0 07/07/2014 1022   NITRITE NEGATIVE 06/29/2015 1649   LEUKOCYTESUR NEGATIVE 06/29/2015 1649   Sepsis Labs: @LABRCNTIP (procalcitonin:4,lacticidven:4) )No results found for this or any previous visit (from the past 240 hour(s)).      UA  no evidence of UTI  Lab Results  Component Value Date   HGBA1C 5.9* 07/07/2014    CrCl cannot be calculated (Unknown ideal weight.).  BNP (last 3 results) No results for input(s): PROBNP in the last 8760 hours.   ECG REPORT  Independently reviewed Rate: 99  Rhythm: sinus LBBB ST&T Change: n/A QTC 500  There were no vitals filed for this visit.   Cultures: No results found for: SDES, SPECREQUEST, CULT, REPTSTATUS   Radiological Exams on Admission: Dg Chest 2 View  06/29/2015  CLINICAL DATA:  55 year old male with a history of bilateral arm numbness. EXAM: CHEST - 2 VIEW COMPARISON:  07/08/2014 FINDINGS: Cardiomediastinal silhouette projects within normal limits in size and contour. No confluent airspace disease, pneumothorax, or pleural effusion. No displaced fracture. Unremarkable appearance of the upper abdomen. IMPRESSION: No radiographic evidence of acute cardiopulmonary disease. Signed, Dulcy Fanny. Earleen Newport, DO [facility] Electronically Signed   By: Corrie Mckusick D.O.   On: 06/29/2015 15:31   Ct Head Wo Contrast  06/29/2015  CLINICAL DATA:  Headache and dizziness. Right sided weakness starting yesterday. Hypertension. Right upper extremity paresthesias. Recent diagnosis of right neck mass along the sternocleidomastoid and the portion of the parotid gland. EXAM: CT HEAD WITHOUT CONTRAST TECHNIQUE: Contiguous axial images were obtained from the base of the skull through the vertex without intravenous contrast. COMPARISON:  Multiple exams, including 07/07/2014 FINDINGS: The brainstem,  cerebellum, cerebral peduncles, thalami, basal ganglia, basilar cisterns, and ventricular system appear within normal limits. No intracranial hemorrhage, mass lesion, or acute CVA. The vertical level of the mass along the deep  right parotid gland is not part of today' s head CT. Visualized paranasal sinuses appear clear. No middle ear fluid or mastoid effusion. IMPRESSION: 1. No significant intracranial abnormality is identified. Electronically Signed   By: Van Clines M.D.   On: 06/29/2015 17:45    Chart has been reviewed    Assessment/Plan  55 year old gentleman with history of TIA last year history of hypertension and  Large right warthin's tumor  presents with right sided tingling and numbness for over 24 hours currently resolved associated with questionable facial droop being admitted for TIA   Present on Admission:  . Hypertension permissive hypertension for now hold hydrochlorothiazide given hyponatremia  . Mass of right side of neck - will need repeat imaging discussed with neurology with type of imaging would be best suited  . TIA (transient ischemic attack) -  - will admit based on TIA/CVA protocol, await results of MRA/MRI, Carotid Doppler and Echo, obtain cardiac enzymes,  ECG,   Lipid panel, TSH. Order PT/OT evaluation. Will make sure patient is on antiplatelet agent increase dose of aspirin to 325 for now.   Neurology consulted.     . ARF (acute renal failure) ([place]) - rehydrate- likely secondary to dehydration, check FeNA and if not improved with IVF would obtain renal US and consider renal consult.  Marland Kitchen Hyponatremia - - likely secondary to dehydration, will give IVF, check Urine Na, Cr, Osmolarity. Monitor Na levels to avoid over aggressive correction. Check TSH. Stop offending medications such as HCTZ. If no improvement with IVF will initiate further work up for SIADH if appropriate.  . Dehydration - will rehydrate check orthostatics . Prolonged QT interval -avoid QT prolonging  medications monitor on tele . Warthin's tumor - will need repeat imaging and if any evidence of vascular compromise secondary to mass will need to father evaluation by ENT  Other plan as per orders.  DVT prophylaxis SCD   Code Status: FULL CODE   as per patient   Family Communication:  Family   at  Bedside  plan of care was discussed with   Wallene Huh [phone], Marva Panda  [phone]  Disposition Plan:    To home once workup is complete and patient is stable   Consults called: Neurology  Dr. Wendee Beavers  Admission status:    obs    Level of care     tele        I have spent a total of 67 min on this admission   extra time was spent to discuss case with consultant  Shemia Bevel 06/29/2015, 8:10 PM    [facility]  Pager [phone]   after 2 AM please page floor coverage PA If 7AM-7PM, please contact the day team taking care of the patient  [email or web address]  Password [email or web address]

## 2015-06-30 ENCOUNTER — Observation Stay (HOSPITAL_COMMUNITY): Payer: BLUE CROSS/BLUE SHIELD

## 2015-06-30 ENCOUNTER — Encounter (HOSPITAL_COMMUNITY): Payer: Self-pay | Admitting: Radiology

## 2015-06-30 DIAGNOSIS — I10 Essential (primary) hypertension: Secondary | ICD-10-CM | POA: Diagnosis not present

## 2015-06-30 DIAGNOSIS — R531 Weakness: Secondary | ICD-10-CM | POA: Diagnosis not present

## 2015-06-30 DIAGNOSIS — R200 Anesthesia of skin: Secondary | ICD-10-CM | POA: Diagnosis not present

## 2015-06-30 DIAGNOSIS — R221 Localized swelling, mass and lump, neck: Secondary | ICD-10-CM | POA: Diagnosis not present

## 2015-06-30 DIAGNOSIS — N179 Acute kidney failure, unspecified: Secondary | ICD-10-CM | POA: Diagnosis not present

## 2015-06-30 LAB — LIPID PANEL
CHOL/HDL RATIO: 4.6 ratio
Cholesterol: 153 mg/dL (ref 0–200)
HDL: 33 mg/dL — AB (ref >40)
LDL CALC: 101 mg/dL — AB (ref 0–99)
Triglycerides: 96 mg/dL (ref <150)
VLDL: 19 mg/dL (ref 0–40)

## 2015-06-30 LAB — TROPONIN I
Troponin I: <0.03 ng/mL (ref <0.031)
Troponin I: <0.03 ng/mL (ref <0.031)

## 2015-06-30 MED ORDER — AMLODIPINE BESYLATE 2.5 MG PO TABS: 2.5000 mg | ORAL_TABLET | Freq: Every day | ORAL | Status: DC

## 2015-06-30 MED ORDER — IOPAMIDOL (ISOVUE-370) INJECTION 76%
INTRAVENOUS | Status: AC
  Filled 2015-06-30: qty 50

## 2015-06-30 NOTE — Progress Notes (Signed)
OT Cancellation Note  Patient Details Name: Daniel Weiss MRN: [id number] DOB: [birth year]   Cancelled Treatment:    Reason Eval/Treat Not Completed: OT screened, no needs identified, will sign off.  MRI negative for acute infarct.  Per PT pt feels he is back to baseline.   Darlina Rumpf Lefors, OTR/L [id number]' 06/30/2015, 3:02 PM

## 2015-06-30 NOTE — Progress Notes (Addendum)
PT Cancellation Note  Patient Details Name: Daniel Weiss MRN: [id number] DOB: 10-18-1960   Cancelled Treatment:    Reason Eval/Treat Not Completed: PT screened, no needs identified, will sign off. Pt is independent with all functional mobility. He verbalizes no concerns and feels he is at his baseline for mobility.   Lorriane Shire 06/30/2015, 1:45 PM

## 2015-06-30 NOTE — Progress Notes (Signed)
Pt discharge education and instructions completed with pt and spouse at bedside. Both voices understanding and denies any questions. Pt IV and telemetry removed; pt discharge home with spouse to transport him home. Pt to pick up electronically sent prescription from preferred pharmacy on file. Pt transported off unit via wheelchair with spouse and belongings to the side. Delia Heady RN

## 2015-06-30 NOTE — Progress Notes (Signed)
Pt arrived to 5M09 from [facility].  Pt alert and oriented x 4. Ambulated to bed with stand-by assist only. Pt placed on telemetry monitor. Vitals taken and stable. Pt oriented to room, call-light and phone.  Will continue to monitor.

## 2015-06-30 NOTE — Discharge Summary (Addendum)
Discharge Summary  Daniel Weiss [id number] DOB: Mar 23, 1960  PCP: No primary care provider on file.  Admit date: 06/29/2015 Discharge date: 06/30/2015  Time spent: <60mins  Recommendations for Outpatient Follow-up:  1. F/u with PMD within a week  for hospital discharge follow up, repeat cbc/bmp at follow up 2. F/u with hematology for erythrocytosis 3. F/u with ENT dr Redmond Baseman  for enlarging Warthin's tumor  Discharge Diagnoses:  Active Hospital Problems   Diagnosis Date Noted  . ARF (acute renal failure) ([facility]) 06/29/2015  . Hyponatremia 06/29/2015  . Dehydration 06/29/2015  . Prolonged QT interval 06/29/2015  . 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 TIA (transient ischemic attack)   . Hypertension 07/07/2014  . Mass of right side of neck 07/07/2014    Resolved Hospital Problems   Diagnosis Date Noted Date Resolved  No resolved problems to display.    Discharge Condition: stable  Diet recommendation: heart healthy  Filed Weights   06/29/15 2151  Weight: 63.05 kg (139 lb)    History of present illness:  Daniel Weiss is a 55 y.o. male with medical history significant of TIA in 2016, HTN and Warhin's tumor    Presented with bilateral numbness of upper extremities worsened right happened yesterday lasted all day form 9:30 in AM. Route patient had another episode this morning He felt well enough to go to work. He walked up a hill and felt funny. Had the same tingling, his coworker suspect that he may have had some facial droop. Tingling was radiating to his chest and felt like his arm was falling asleep. Reports his arm was cramping. Not improved with movement with changing of position. This was associated with mild headache worse in the left being coming and going but he took some Tylenol for it and make better no blurred vision or photophobia. Symptoms have resolved by time patient presented to emergency department patient endorses some mild nausea and vomiting associated this 2  days ago had 24 h of diarrhea. He is feeling better now, no chest pain no shortness of breath, no fever no chills. No cough. Reports feeling light headed when he standing up. No sick contacts. He works as Training and development officer.   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  ER course: CT scan of the head show no evidence of CVA. Chest x-ray show no evidence of cardiopulmonary disease creatinine noted to be elevated 1.38 she noted to be hypertensive 170/114 He was given boluses of normal saline 2 L  admission for TIA, dehydration   Hospital Course:  Active Problems:   Hypertension   Mass of right side of neck   TIA (transient ischemic attack)   ARF (acute renal failure) ([facility])   Hyponatremia   Dehydration   Prolonged QT interval   Warthin's tumor  Hypertension , initial on presentation sbp in 200, bp improved without bp meds. Instructed patient to do home bp monitoring and bring bp record to pmd for  blood pressure monitoring and control. hctz d/ced due to hyponatremia, cr elevation, started low dose norvasc.  Transient right sided weakness and numbness: mri brain no infarct, carotid US in 06/2014 unremarkable.  CTA head/neck No emergent large vessel occlusion. Symptom has resolved, neurology input appreciated,  Actually cause of symptom unclear, from hypertensive encephalopathy? Patient also has  erythrocytosis in the setting of dehydration and chronic cigarette smoking, encourage hydration, bp control. Continue asa 81/statin.   . Mass of right side of neck - biopsy in 2016 + warthin's tumor, continue outpatient follow up with ENT Dr Redmond Baseman. i    . ARF (acute renal failure) ([facility]) - ua no infection, no protein,  likely secondary to dehydration, s/p hydration , better , d/c hctz, repeat labs in one week at hospital follow up.  Marland Kitchen Hyponatremia - - asymptomatic, likely secondary to dehydration, will give IVF, urine sodium was not done.  Stop hctz, s/p hydration in the hospital, repeat bmp at hospital discharge follow up.    . Prolonged QT interval -avoid QT prolonging medications, repeat kgf, Qtc improved, also has chronic lbbb, no chest pain, no sob. Echocardiogram in 06/2014 unremarkable  Erythrocytosis, with chronic cigarette smoking and dehydration. Repeat cbc, quit smoking, encourage hydration, hematology outpatient follow up.  Smoking: smoking cessation education provided.  Code Status: FULL CODE as per patient   Family Communication: I talked to patient and his wife at bedside plan of care was discussed with Wallene Huh [id number], Marva Panda [phone] by admitting MD.  Disposition Plan: To home   Procedures:  none  Consultations:  neurology  Discharge Exam: BP 145/97 mmHg  Pulse 64  Temp(Src) 97.9 F (36.6 C) (Oral)  Resp 18  Ht 6' (1.829 m)  Wt 63.05 kg (139 lb)  BMI 18.85 kg/m2  SpO2 100%  General: aaox3, large right neck mass Cardiovascular: rrr Respiratory: CTABL Neuro: aaox3, no focal deficit  Discharge Instructions You were cared for by a hospitalist during your hospital stay. If you have any questions about your discharge medications or the care you received while you were in the hospital after you are discharged, you can call the unit and asked to speak with the hospitalist on call if the hospitalist that took  care of you is not available. Once you are discharged, your primary care physician will handle any further medical issues. Please note that NO REFILLS for any discharge medications will be authorized once you are discharged, as it is imperative that you return to your primary care physician (or establish a relationship with a primary care physician if you do not have one) for your aftercare needs so that they can reassess your need for medications and monitor your lab values.      Discharge Instructions    Diet - low sodium heart healthy    Complete by:  As directed   Low fat diet     Increase activity slowly    Complete by:  As directed             Medication List    STOP taking these medications        hydrochlorothiazide 25 MG tablet  Commonly known as:  HYDRODIURIL      TAKE these medications        acetaminophen 325 MG tablet  Commonly known as:  TYLENOL  Take 2 tablets (650 mg total) by mouth every 4 (four) hours as needed for mild pain (temperature >/= 99.5 F).     amLODipine 2.5  MG tablet  Commonly known as:  NORVASC  Take 1 tablet (2.5 mg total) by mouth daily.  Start taking on:  07/01/2015     aspirin 81 MG EC tablet  Take 1 tablet (81 mg total) by mouth daily.     atorvastatin 40 MG tablet  Commonly known as:  LIPITOR  Take 1 tablet (40 mg total) by mouth daily.       No Known Allergies Follow-up Information    Follow up with [facility], NI, MD In 2 weeks.   Specialty:  Hematology and Oncology   Why:  erythrocytosis, stroke symptoms   Contact information:   [place] [id number] [phone]       Follow up with [facility] In 1 week.   Why:  hospital discharge follow up, repeat cbc/bmp at  follow up   Contact information:   [address] [place] [id number] [phone]      Follow up with [name], MD.   Specialty:  Otolaryngology   Why:  enlarging WARTHIN'S TUMOR    Contact information:   [address] [place] [address] [phone]        The results of significant diagnostics from this hospitalization (including imaging, microbiology, ancillary and laboratory) are listed below for reference.    Significant Diagnostic Studies: Ct Angio Head W/cm &/or Wo Cm  06/30/2015  ADDENDUM REPORT: 06/30/2015 14:22 ADDENDUM: There should also be an Impression #4 which reads: 4. 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 Electronically Signed   By: Genevie Ann M.D.   On: 06/30/2015 14:22  06/30/2015  CLINICAL DATA:  55 year old male with 48 hours of right upper extremity numbness tingling and weakness. History of hypertension and right neck mass. Initial encounter. EXAM: CT ANGIOGRAPHY HEAD AND NECK TECHNIQUE: Multidetector CT imaging of the head and neck was performed using the standard protocol during bolus administration of intravenous contrast. Multiplanar CT image reconstructions and MIPs were obtained to evaluate the vascular anatomy. Carotid stenosis measurements (when applicable) are obtained utilizing NASCET criteria, using the distal internal carotid diameter as the denominator. CONTRAST:  50 mL Isovue 370 COMPARISON:  Brain MRI 1123 hours today.  Neck CT 07/08/2014. FINDINGS: CTA NECK Skeleton: Intermittent poor dentition. Degenerative changes in the cervical spine, including possible developing ossification of the posterior longitudinal ligament (OPLL from the C4 to the C6 level. Superimposed bulky disc degeneration at C4-C5. Subsequent moderate to severe spinal stenosis (series 401, image 80). No acute or suspicious osseous lesion identified. Visualized paranasal sinuses and mastoids are clear. Other neck: Large and vascular right neck mass extends from  the right parotid space caudally along the soft tissues overlying the right sternocleidomastoid muscle. This encompasses 42 x 31 x 83 mm (AP by transverse by CC) and has not significantly changed in size or configuration since April 2016. There is no associated malignant appearing right level 2 or level 3 lymphadenopathy. Negative thyroid, larynx, pharynx, parapharyngeal spaces and retropharyngeal space. Negative sublingual space and submandibular glands. The left parotid gland appears normal. No superior mediastinal lymphadenopathy. Negative visualized lung apices. Aortic arch: Bovine type arch configuration. Minimal arch atherosclerosis. Right carotid system: Negative aside from mild posterior right ICA bulb calcified plaque. Left carotid system: Negative. Vertebral arteries: No proximal subclavian artery stenosis. Mild  calcified plaque on the left. Normal right vertebral artery to the skullbase. The right vertebral is dominant. There is calcified plaque at the left vertebral artery origin resulting in moderate to severe stenosis (series 403, image 150). The left vertebral is non dominant and tortuous in the V2 segment, but otherwise negative to the skullbase. CTA HEAD Posterior circulation: Dominant distal right vertebral artery. Normal PICA origins. Patent vertebrobasilar junction. No distal vertebral stenosis. Normal basilar artery. Normal SCA origins. Fetal type bilateral PCA origins. Normal bilateral PCA branches. Anterior circulation: Normal ICA siphons. Normal ophthalmic and posterior communicating artery origins. Normal carotid termini. Normal MCA and ACA origins. Diminutive or absent anterior communicating artery. Normal bilateral ACA branches. Left MCA M1 segment, bifurcation, and left MCA branches are within normal limits. Right MCA M1 segment, bifurcation, and right MCA branches are within normal limits. Venous sinuses: Patent. Anatomic variants: Dominant right vertebral artery. Delayed phase: No  abnormal enhancement of the brain. IMPRESSION: 1. Negative arterial findings on CTA head and neck. No emergent large vessel occlusion. 2. Large, 8 cm right neck mass extending from the right parotid space caudally superficial to the right sternocleidomastoid muscle. Despite evidence of hypercellularity on today's brain MRI which would ordinarily suggest malignancy, the lesion appears stable in size and configuration for the past year and might therefore be low grade or benign. Furthermore, this mass does appear to be somewhat hypervascular, with prominent right external carotid artery supply. This is an unusual constellation of findings. Sarcoma or similar histology may be most likely. Histologic correlation is still strongly recommended in light of the large tumor size and patient age. 3. No associated cervical lymphadenopathy. No metastatic disease identified in the chest or upper neck. Electronically Signed: By: Genevie Ann M.D. On: 06/30/2015 13:28   Dg Chest 2 View  06/29/2015  CLINICAL DATA:  55 year old male with a history of bilateral arm numbness. EXAM: CHEST - 2 VIEW COMPARISON:  07/08/2014 FINDINGS: Cardiomediastinal silhouette projects within normal limits in size and contour. No confluent airspace disease, pneumothorax, or pleural effusion. No displaced fracture. Unremarkable appearance of the upper abdomen. IMPRESSION: No radiographic evidence of acute cardiopulmonary disease. Signed, Dulcy Fanny. Earleen Newport, DO [facility] Electronically Signed   By: Corrie Mckusick D.O.   On: 06/29/2015 15:31   Ct Head Wo Contrast  06/29/2015  CLINICAL DATA:  Headache and dizziness. Right sided weakness starting yesterday. Hypertension. Right upper extremity paresthesias. Recent diagnosis of right neck mass along the sternocleidomastoid and the portion of the parotid gland. EXAM: CT HEAD WITHOUT CONTRAST TECHNIQUE: Contiguous axial images were obtained from the base  of the skull through the vertex without intravenous contrast. COMPARISON:  Multiple exams, including 07/07/2014 FINDINGS: The brainstem, cerebellum, cerebral peduncles, thalami, basal ganglia, basilar cisterns, and ventricular system appear within normal limits. No intracranial hemorrhage, mass lesion, or acute CVA. The vertical level of the mass along the deep right parotid gland is not part of today' s head CT. Visualized paranasal sinuses appear clear. No middle ear fluid or mastoid effusion. IMPRESSION: 1. No significant intracranial abnormality is identified. Electronically Signed   By: Van Clines M.D.   On: 06/29/2015 17:45   Ct Angio Neck W/cm &/or Wo/cm  06/30/2015  ADDENDUM REPORT: 06/30/2015 14:22 ADDENDUM: There should also be an Impression #4 which reads: 4. 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 This appears related to disc and endplate degeneration, as well as possible early ossification of  the posterior longitudinal ligament (OPLL). Electronically Signed   By: Genevie Ann M.D.   On: 06/30/2015 14:22  06/30/2015  CLINICAL DATA:  55 year old male with 48 hours of right upper extremity numbness tingling and weakness. History of hypertension and right neck mass. Initial encounter. EXAM: CT ANGIOGRAPHY HEAD AND NECK TECHNIQUE: Multidetector CT imaging of the head and neck was performed using the standard protocol during bolus administration of intravenous contrast. Multiplanar CT image reconstructions and MIPs were obtained to evaluate the vascular anatomy. Carotid stenosis measurements (when applicable) are obtained utilizing NASCET criteria, using the distal internal carotid diameter as the denominator. CONTRAST:  50 mL Isovue 370 COMPARISON:  Brain MRI 1123 hours today.  Neck CT 07/08/2014. FINDINGS: CTA NECK Skeleton: Intermittent poor dentition. Degenerative changes in the cervical spine, including possible developing ossification of  the posterior longitudinal ligament (OPLL from the C4 to the C6 level. Superimposed bulky disc degeneration at C4-C5. Subsequent moderate to severe spinal stenosis (series 401, image 80). No acute or suspicious osseous lesion identified. Visualized paranasal sinuses and mastoids are clear. Other neck: Large and vascular right neck mass extends from the right parotid space caudally along the soft tissues overlying the right sternocleidomastoid muscle. This encompasses 42 x 31 x 83 mm (AP by transverse by CC) and has not significantly changed in size or configuration since April 2016. There is no associated malignant appearing right level 2 or level 3 lymphadenopathy. Negative thyroid, larynx, pharynx, parapharyngeal spaces and retropharyngeal space. Negative sublingual space and submandibular glands. The left parotid gland appears normal. No superior mediastinal lymphadenopathy. Negative visualized lung apices. Aortic arch: Bovine type arch configuration. Minimal arch atherosclerosis. Right carotid system: Negative aside from mild posterior right ICA bulb calcified plaque. Left carotid system: Negative. Vertebral arteries: No proximal subclavian artery stenosis. Mild calcified plaque on the left. Normal right vertebral artery to the skullbase. The right vertebral is dominant. There is calcified plaque at the left vertebral artery origin resulting in moderate to severe stenosis (series 403, image 150). The left vertebral is non dominant and tortuous in the V2 segment, but otherwise negative to the skullbase. CTA HEAD Posterior circulation: Dominant distal right vertebral artery. Normal PICA origins. Patent vertebrobasilar junction. No distal vertebral stenosis. Normal basilar artery. Normal SCA origins. Fetal type bilateral PCA origins. Normal bilateral PCA branches. Anterior circulation: Normal ICA siphons. Normal ophthalmic and posterior communicating artery origins. Normal carotid termini. Normal MCA and ACA  origins. Diminutive or absent anterior communicating artery. Normal bilateral ACA branches. Left MCA M1 segment, bifurcation, and left MCA branches are within normal limits. Right MCA M1 segment, bifurcation, and right MCA branches are within normal limits. Venous sinuses: Patent. Anatomic variants: Dominant right vertebral artery. Delayed phase: No abnormal enhancement of the brain. IMPRESSION: 1. Negative arterial findings on CTA head and neck. No emergent large vessel occlusion. 2. Large, 8 cm right neck mass extending from the right parotid space caudally superficial to the right sternocleidomastoid muscle. Despite evidence of hypercellularity on today's brain MRI which would ordinarily suggest malignancy, the lesion appears stable in size and configuration for the past year and might therefore be low grade or benign. Furthermore, this mass does appear to be somewhat hypervascular, with prominent right external carotid artery supply. This is an unusual constellation of findings. Sarcoma or similar histology may be most likely. Histologic correlation is still strongly recommended in light of the large tumor size and patient age. 3. No associated cervical lymphadenopathy. No metastatic disease identified in the chest or  upper neck. Electronically Signed: By: Genevie Ann M.D. On: 06/30/2015 13:28   Mr Brain Wo Contrast  06/30/2015  CLINICAL DATA:  55 year old male with 48 hours of right upper extremity numbness tingling and weakness. History of hypertension and right neck mass. Initial encounter. EXAM: MRI HEAD WITHOUT CONTRAST TECHNIQUE: Multiplanar, multiecho pulse sequences of the brain and surrounding structures were obtained without intravenous contrast. COMPARISON:  Head CT without contrast 06/29/2015. Neck CT 07/08/2014. Brain MRI 07/07/2014 FINDINGS: Major intracranial vascular flow voids are stable. No restricted diffusion to suggest acute infarction. No midline shift, mass effect, evidence of mass lesion,  ventriculomegaly, extra-axial collection or acute intracranial hemorrhage. Cervicomedullary junction and pituitary are within normal limits. Negative visualized cervical spine. Visible bone marrow signal remains normal. Small scattered cerebral white matter nonspecific T2 and FLAIR hyperintense foci are stable since 2016. No cortical encephalomalacia or chronic cerebral blood products. Visualized internal auditory structures appear stable. Mastoid air cells are clear. Mild paranasal sinus mucosal thickening has not significantly changed. Negative orbit and scalp soft tissues. Large right lateral neck mass extending caudally from the right parotid space is re - demonstrated with evidence of hypercellularity (series 8, image 16). IMPRESSION: 1. No acute intracranial abnormality. Stable noncontrast MRI appearance of the brain since 2016. 2. Large right neck mass partially re- identified. 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 I do not find evidence of biopsy/pathology of this lesion in the EMR. Strongly recommend histologic correlation if not already done, and if preferred this should be amenable to US guided Biopsy. Electronically Signed   By: Genevie Ann M.D.   On: 06/30/2015 12:23    Microbiology: No results found for this or any previous visit (from the past 240 hour(s)).   Labs: Basic Metabolic Panel:  Recent Labs Lab 06/29/15 1503 06/29/15 1539  NA 131* 131*  K 3.9 3.8  CL 94* 95*  CO2 23  --   GLUCOSE 103* 96  BUN 18 22*  CREATININE 1.38* 1.30*  CALCIUM 9.2  --    Liver Function Tests:  Recent Labs Lab 06/29/15 1503  AST 27  ALT 14*  ALKPHOS 59  BILITOT 0.8  PROT 6.9  ALBUMIN 3.8   No results for input(s): LIPASE, AMYLASE in the last 168 hours. No results for input(s): AMMONIA in the last 168 hours. CBC:  Recent Labs Lab 06/29/15 1503 06/29/15 1537 06/29/15 1539  WBC  --  6.2  --   NEUTROABS 3.1  --   --   HGB  --  15.2  19.0*  HCT  --  44.8 56.0*  MCV  --  79.6  --   PLT  --  285  --    Cardiac Enzymes:  Recent Labs Lab 06/29/15 2221 06/30/15 0348 06/30/15 0939  TROPONINI <0.03 <0.03 <0.03   BNP: BNP (last 3 results) No results for input(s): BNP in the last 8760 hours.  ProBNP (last 3 results) No results for input(s): PROBNP in the last 8760 hours.  CBG: No results for input(s): GLUCAP in the last 168 hours.     SignedFlorencia Reasons MD, PhD  [facility] 06/30/2015, 3:44 PM

## 2015-06-30 NOTE — Progress Notes (Signed)
STROKE TEAM PROGRESS NOTE   HISTORY OF PRESENT ILLNESS Daniel Weiss is a 55 y.o. male with hx of left neck mass, which has been followed by surgery as outpatient. It is considered to be benign I am told. However, it has gotten larger since the last time the patient saw his surgeon I am told. The patient also suffers from HTN and missed some doses of his medicine in the last couople of days. In this setting he presents now to the ED with sensation of right arm numbness tingling and clumsiness which lasted an entire 48h period. He also had minor left hand numbness. At the time this started he was actually feeling lightheaded and dyaphoretic. His main concern is that this felt a lot like a time in the past when he suffered a stroke and had similar symptoms. In the ED he was found to have uncontrolled HTN. Because of his neck mass, I am called to investigate whether this mass has something to do with his neurological symptoms given its proximity to vascular structures in the neck.  SUBJECTIVE (INTERVAL HISTORY) No family members present. The patient states that he had a previous needle biopsy of his neck mass, which apparently was benign, but has not had surgery secondary to financial concerns. MRI no stroke and CTA head and neck negative. His presenting symptoms more consistent with hypertensive encephalopathy.   OBJECTIVE Temp:  [97.6 F (36.4 C)-98.6 F (37 C)] 98.3 F (36.8 C) (04/22 0604) Pulse Rate:  [70-99] 70 (04/22 0604) Cardiac Rhythm:  [-] Normal sinus rhythm (04/21 2201) Resp:  [15-22] 16 (04/22 0604) BP: (123-185)/(73-128) 132/81 mmHg (04/22 0604) SpO2:  [97 %-100 %] 99 % (04/22 0604) Weight:  [63.05 kg (139 lb)] 63.05 kg (139 lb) (04/21 2151)  CBC:  Recent Labs Lab 06/29/15 1503 06/29/15 1537 06/29/15 1539  WBC  --  6.2  --   NEUTROABS 3.1  --   --   HGB  --  15.2 19.0*  HCT  --  44.8 56.0*  MCV  --  79.6  --   PLT  --  285  --     Basic Metabolic Panel:  Recent  Labs Lab 06/29/15 1503 06/29/15 1539  NA 131* 131*  K 3.9 3.8  CL 94* 95*  CO2 23  --   GLUCOSE 103* 96  BUN 18 22*  CREATININE 1.38* 1.30*  CALCIUM 9.2  --     Lipid Panel:    Component Value Date/Time   CHOL 153 06/30/2015 0348   TRIG 96 06/30/2015 0348   HDL 33* 06/30/2015 0348   CHOLHDL 4.6 06/30/2015 0348   VLDL 19 06/30/2015 0348   LDLCALC 101* 06/30/2015 0348   HgbA1c:  Lab Results  Component Value Date   HGBA1C 5.9* 07/07/2014   Urine Drug Screen:    Component Value Date/Time   LABOPIA NONE DETECTED 06/29/2015 1649   COCAINSCRNUR NONE DETECTED 06/29/2015 1649   LABBENZ NONE DETECTED 06/29/2015 1649   AMPHETMU NONE DETECTED 06/29/2015 1649   THCU POSITIVE* 06/29/2015 1649   LABBARB NONE DETECTED 06/29/2015 1649      IMAGING I have personally reviewed the radiological images below and agree with the radiology interpretations.  Dg Chest 2 View 06/29/2015   No radiographic evidence of acute cardiopulmonary disease.   Ct Head Wo Contrast 06/29/2015   1. No significant intracranial abnormality is identified.   MRI of the brain without contrast 06/30/2015 1. No acute intracranial abnormality. Stable noncontrast MRI appearance of the  brain since 2016. 2. Large right neck mass partially re- identified. 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 I do not find evidence of biopsy/pathology of this lesion in the EMR. Strongly recommend histologic correlation if not already done, and if preferred this should be amenable to US guided Biopsy.  CTA of the head and neck 06/30/2015 1. Negative arterial findings on CTA head and neck. No emergent large vessel occlusion. 2. Large, 8 cm right neck mass extending from the right parotid space caudally superficial to the right sternocleidomastoid muscle. Despite evidence of hypercellularity on today's brain MRI which would ordinarily suggest malignancy, the lesion appears stable in  size and configuration for the past year and might therefore be low grade or benign. Furthermore, this mass does appear to be somewhat hypervascular, with prominent right external carotid artery supply. This is an unusual constellation of findings. Sarcoma or similar histology may be most likely. Histologic correlation is still strongly recommended in light of the large tumor size and patient age. 3. No associated cervical lymphadenopathy. No metastatic disease identified in the chest or upper neck.   PHYSICAL EXAM  Temp:  [97.9 F (36.6 C)-98.7 F (37.1 C)] 97.9 F (36.6 C) (04/22 1416) Pulse Rate:  [64-83] 64 (04/22 1416) Resp:  [16-18] 18 (04/22 1416) BP: (123-146)/(78-97) 145/97 mmHg (04/22 1416) SpO2:  [99 %-100 %] 100 % (04/22 1416)  General - Well nourished, well developed, in no apparent distress.  Ophthalmologic - Fundi not visualized due to .  Cardiovascular - Regular rate and rhythm with no murmur.  Mental Status -  Level of arousal and orientation to time, place, and person were intact. Language including expression, naming, repetition, comprehension was assessed and found intact. Fund of Knowledge was assessed and was intact.  Cranial Nerves II - XII - right neck mass, no tenderness on palpation.  II - Visual field intact OU. III, IV, VI - Extraocular movements intact. V - Facial sensation intact bilaterally. VII - Facial movement intact bilaterally. VIII - Hearing & vestibular intact bilaterally. X - Palate elevates symmetrically. XI - Chin turning & shoulder shrug intact bilaterally. XII - Tongue protrusion intact.  Motor Strength - The patient's strength was normal in all extremities and pronator drift was absent.  Bulk was normal and fasciculations were absent.   Motor Tone - Muscle tone was assessed at the neck and appendages and was normal.  Reflexes - The patient's reflexes were 1+ in all extremities and he had no pathological reflexes.  Sensory - Light  touch, temperature/pinprick were assessed and were symmetrical.    Coordination - The patient had normal movements in the hands and feet with no ataxia or dysmetria.  Tremor was absent.  Gait and Station - deferred.   ASSESSMENT/PLAN Mr. Daniel Weiss is a 55 y.o. male with history of a known neck mass, hypertension, and ongoing tobacco use presenting with bilateral upper extremity numbness. He did not receive IV t-PA as it was not felt that his deficits were secondary to a stroke.  Hypertensive encephalopathy  MRI  No stroke  CTA head and neck - unremarkable for brain vessels  2D Echo  Not ordered  LDL 101  HgbA1c pending  SCDs for VTE prophylaxis  Diet - low sodium heart healthy   No antithrombotic prior to admission, now on aspirin 81 mg daily  Patient counseled to be compliant with his antithrombotic medications  Ongoing aggressive stroke risk factor management  Disposition:  Home  Right neck mass  Gradually growing  Biopsy showed benign tumor  Resection pending financial environment  Hypertension  Home meds:   Amlodipine  BP goal normotensive  unstable  Patient counseled to be compliant with his blood pressure medications  Hyperlipidemia  Home meds:  none   Currently on lipitor 40  LDL 101, goal < 70  Continue statin at discharge  Other Stroke Risk Factors   Hospital day # 1  Neurology will sign off. Please call with questions. No neuro follow up needed this time. Thanks for the consult.   Rosalin Hawking, MD PhD Stroke Neurology 07/01/2015 12:20 AM    To contact Stroke Continuity provider, please refer to [email or web address] After hours, contact General Neurology

## 2015-06-30 NOTE — Care Management Note (Signed)
Case Management Note  Patient Details  Name: Keil Corazza MRN: [id number] Date of Birth: [birth year]  Subjective/Objective:                  Neck Mass  Action/Plan: CM spoke with patient at the bedside. Provided with the [facility] and [facility] brochure for a PCP. Encouraged to call on Monday. Patient agrees.   Expected Discharge Date:   06/30/15               Expected Discharge Plan:     In-House Referral:     Discharge planning Services  CM Consult, [facility]  Post Acute Care Choice:    Choice offered to:     DME Arranged:    DME Agency:     HH Arranged:    HH Agency:     Status of Service:  Completed, signed off  Medicare Important Message Given:    Date Medicare IM Given:    Medicare IM give by:    Date Additional Medicare IM Given:    Additional Medicare Important Message give by:     If discussed at [place] of Stay Meetings, dates discussed:    Additional Comments:  Apolonio Schneiders, RN 06/30/2015, 4:22 PM

## 2015-07-01 DIAGNOSIS — I674 Hypertensive encephalopathy: Secondary | ICD-10-CM | POA: Insufficient documentation

## 2015-07-02 LAB — HEMOGLOBIN A1C
HEMOGLOBIN A1C: 5.7 % — AB (ref 4.8–5.6)
Mean Plasma Glucose: 117 mg/dL

## 2015-07-02 MED ORDER — IOPAMIDOL (ISOVUE-370) INJECTION 76%
50.0000 mL | Freq: Once | INTRAVENOUS | Status: AC | PRN
  Administered 2015-06-30: 50 mL via INTRAVENOUS

## 2018-02-07 DIAGNOSIS — R9389 Abnormal findings on diagnostic imaging of other specified body structures: Secondary | ICD-10-CM

## 2018-02-07 DIAGNOSIS — I255 Ischemic cardiomyopathy: Secondary | ICD-10-CM

## 2018-02-07 DIAGNOSIS — I214 Non-ST elevation (NSTEMI) myocardial infarction: Secondary | ICD-10-CM

## 2018-02-07 HISTORY — DX: Non-ST elevation (NSTEMI) myocardial infarction: I21.4

## 2018-02-07 HISTORY — DX: Ischemic cardiomyopathy: I25.5

## 2018-02-07 HISTORY — DX: Abnormal findings on diagnostic imaging of other specified body structures: R93.89

## 2018-03-06 ENCOUNTER — Inpatient Hospital Stay (HOSPITAL_COMMUNITY)
Admission: EM | Disposition: A | Discharge status: Home / Self Care | Payer: Self-pay | Source: Home / Self Care | Attending: Cardiology

## 2018-03-06 ENCOUNTER — Encounter (HOSPITAL_BASED_OUTPATIENT_CLINIC_OR_DEPARTMENT_OTHER): Payer: Self-pay | Admitting: *Deleted

## 2018-03-06 ENCOUNTER — Inpatient Hospital Stay (HOSPITAL_BASED_OUTPATIENT_CLINIC_OR_DEPARTMENT_OTHER)
Admission: EM | Admit: 2018-03-06 | Discharge: 2018-03-10 | DRG: 247 | Disposition: A | Discharge status: Home / Self Care | Payer: Medicaid Other | Attending: Cardiology | Admitting: Cardiology

## 2018-03-06 ENCOUNTER — Emergency Department (HOSPITAL_BASED_OUTPATIENT_CLINIC_OR_DEPARTMENT_OTHER): Payer: Medicaid Other

## 2018-03-06 ENCOUNTER — Other Ambulatory Visit: Payer: Self-pay

## 2018-03-06 DIAGNOSIS — Z8673 Personal history of transient ischemic attack (TIA), and cerebral infarction without residual deficits: Secondary | ICD-10-CM

## 2018-03-06 DIAGNOSIS — Z8249 Family history of ischemic heart disease and other diseases of the circulatory system: Secondary | ICD-10-CM | POA: Diagnosis not present

## 2018-03-06 DIAGNOSIS — I214 Non-ST elevation (NSTEMI) myocardial infarction: Secondary | ICD-10-CM

## 2018-03-06 DIAGNOSIS — Z720 Tobacco use: Secondary | ICD-10-CM

## 2018-03-06 DIAGNOSIS — Z79899 Other long term (current) drug therapy: Secondary | ICD-10-CM | POA: Diagnosis not present

## 2018-03-06 DIAGNOSIS — I10 Essential (primary) hypertension: Secondary | ICD-10-CM

## 2018-03-06 DIAGNOSIS — Z806 Family history of leukemia: Secondary | ICD-10-CM | POA: Diagnosis not present

## 2018-03-06 DIAGNOSIS — Z803 Family history of malignant neoplasm of breast: Secondary | ICD-10-CM | POA: Diagnosis not present

## 2018-03-06 DIAGNOSIS — I255 Ischemic cardiomyopathy: Secondary | ICD-10-CM | POA: Diagnosis present

## 2018-03-06 DIAGNOSIS — I25119 Atherosclerotic heart disease of native coronary artery with unspecified angina pectoris: Secondary | ICD-10-CM | POA: Diagnosis present

## 2018-03-06 DIAGNOSIS — R000 Tachycardia, unspecified: Secondary | ICD-10-CM | POA: Diagnosis present

## 2018-03-06 DIAGNOSIS — I213 ST elevation (STEMI) myocardial infarction of unspecified site: Secondary | ICD-10-CM

## 2018-03-06 DIAGNOSIS — E785 Hyperlipidemia, unspecified: Secondary | ICD-10-CM

## 2018-03-06 DIAGNOSIS — Z823 Family history of stroke: Secondary | ICD-10-CM

## 2018-03-06 DIAGNOSIS — I447 Left bundle-branch block, unspecified: Secondary | ICD-10-CM | POA: Diagnosis present

## 2018-03-06 DIAGNOSIS — E876 Hypokalemia: Secondary | ICD-10-CM | POA: Diagnosis present

## 2018-03-06 DIAGNOSIS — N400 Enlarged prostate without lower urinary tract symptoms: Secondary | ICD-10-CM | POA: Diagnosis present

## 2018-03-06 DIAGNOSIS — I119 Hypertensive heart disease without heart failure: Secondary | ICD-10-CM | POA: Diagnosis present

## 2018-03-06 DIAGNOSIS — F17200 Nicotine dependence, unspecified, uncomplicated: Secondary | ICD-10-CM | POA: Diagnosis present

## 2018-03-06 DIAGNOSIS — Z955 Presence of coronary angioplasty implant and graft: Secondary | ICD-10-CM

## 2018-03-06 DIAGNOSIS — G453 Amaurosis fugax: Secondary | ICD-10-CM

## 2018-03-06 DIAGNOSIS — I237 Postinfarction angina: Secondary | ICD-10-CM | POA: Diagnosis present

## 2018-03-06 DIAGNOSIS — Z833 Family history of diabetes mellitus: Secondary | ICD-10-CM | POA: Diagnosis not present

## 2018-03-06 DIAGNOSIS — D119 Benign neoplasm of major salivary gland, unspecified: Secondary | ICD-10-CM | POA: Diagnosis present

## 2018-03-06 DIAGNOSIS — R221 Localized swelling, mass and lump, neck: Secondary | ICD-10-CM | POA: Diagnosis present

## 2018-03-06 DIAGNOSIS — C61 Malignant neoplasm of prostate: Secondary | ICD-10-CM

## 2018-03-06 DIAGNOSIS — I2510 Atherosclerotic heart disease of native coronary artery without angina pectoris: Secondary | ICD-10-CM

## 2018-03-06 DIAGNOSIS — R9389 Abnormal findings on diagnostic imaging of other specified body structures: Secondary | ICD-10-CM

## 2018-03-06 HISTORY — DX: Non-ST elevation (NSTEMI) myocardial infarction: I21.4

## 2018-03-06 HISTORY — PX: CORONARY STENT INTERVENTION: CATH118234

## 2018-03-06 HISTORY — PX: LEFT HEART CATH AND CORONARY ANGIOGRAPHY: CATH118249

## 2018-03-06 HISTORY — PX: INTRAOPERATIVE TRANSTHORACIC ECHOCARDIOGRAM: SHX6523

## 2018-03-06 HISTORY — DX: Abnormal findings on diagnostic imaging of other specified body structures: R93.89

## 2018-03-06 HISTORY — DX: Atherosclerotic heart disease of native coronary artery without angina pectoris: I25.10

## 2018-03-06 HISTORY — DX: Transient cerebral ischemic attack, unspecified: G45.9

## 2018-03-06 HISTORY — DX: Unspecified systolic (congestive) heart failure: I50.20

## 2018-03-06 HISTORY — DX: Nicotine dependence, unspecified, uncomplicated: F17.200

## 2018-03-06 HISTORY — DX: Left bundle-branch block, unspecified: I44.7

## 2018-03-06 HISTORY — DX: Benign neoplasm of major salivary gland, unspecified: D11.9

## 2018-03-06 HISTORY — DX: Ischemic cardiomyopathy: I25.5

## 2018-03-06 LAB — CBC WITH DIFFERENTIAL/PLATELET
Abs Immature Granulocytes: 0.07 10*3/uL (ref 0.00–0.07)
BASOS ABS: 0.1 10*3/uL (ref 0.0–0.1)
Basophils Relative: 0 %
EOS ABS: 0 10*3/uL (ref 0.0–0.5)
EOS PCT: 0 %
HEMATOCRIT: 49.5 % (ref 39.0–52.0)
HEMOGLOBIN: 15.7 g/dL (ref 13.0–17.0)
Immature Granulocytes: 1 %
LYMPHS PCT: 22 %
Lymphs Abs: 3.1 10*3/uL (ref 0.7–4.0)
MCH: 25.5 pg — ABNORMAL LOW (ref 26.0–34.0)
MCHC: 31.7 g/dL (ref 30.0–36.0)
MCV: 80.4 fL (ref 80.0–100.0)
MONO ABS: 2.1 10*3/uL — AB (ref 0.1–1.0)
MONOS PCT: 15 %
NRBC: 0 % (ref 0.0–0.2)
Neutro Abs: 8.8 10*3/uL — ABNORMAL HIGH (ref 1.7–7.7)
Neutrophils Relative %: 62 %
Platelets: 286 10*3/uL (ref 150–400)
RBC: 6.16 MIL/uL — ABNORMAL HIGH (ref 4.22–5.81)
RDW: 20.5 % — AB (ref 11.5–15.5)
WBC: 14.1 10*3/uL — ABNORMAL HIGH (ref 4.0–10.5)

## 2018-03-06 LAB — COMPREHENSIVE METABOLIC PANEL
ALBUMIN: 3.7 g/dL (ref 3.5–5.0)
ALT: 38 U/L (ref 0–44)
AST: 106 U/L — AB (ref 15–41)
Alkaline Phosphatase: 73 U/L (ref 38–126)
Anion gap: 14 (ref 5–15)
BILIRUBIN TOTAL: 0.9 mg/dL (ref 0.3–1.2)
BUN: 16 mg/dL (ref 6–20)
CHLORIDE: 94 mmol/L — AB (ref 98–111)
CO2: 24 mmol/L (ref 22–32)
CREATININE: 1.37 mg/dL — AB (ref 0.61–1.24)
Calcium: 9.3 mg/dL (ref 8.9–10.3)
GFR calc Af Amer: >60 mL/min (ref >60)
GFR calc non Af Amer: 56 mL/min — ABNORMAL LOW (ref >60)
GLUCOSE: 121 mg/dL — AB (ref 70–99)
POTASSIUM: 3.8 mmol/L (ref 3.5–5.1)
Sodium: 132 mmol/L — ABNORMAL LOW (ref 135–145)
TOTAL PROTEIN: 8.5 g/dL — AB (ref 6.5–8.1)

## 2018-03-06 LAB — TROPONIN I
Troponin I: 27.69 ng/mL (ref <0.03)
Troponin I: 30.65 ng/mL (ref <0.03)
Troponin I: 28.85 ng/mL (ref <0.03)

## 2018-03-06 LAB — LIPID PANEL
CHOL/HDL RATIO: 2.9 ratio
Cholesterol: 180 mg/dL (ref 0–200)
HDL: 63 mg/dL (ref >40)
LDL CALC: 86 mg/dL (ref 0–99)
Triglycerides: 154 mg/dL — ABNORMAL HIGH (ref <150)
VLDL: 31 mg/dL (ref 0–40)

## 2018-03-06 LAB — PROTIME-INR
INR: 0.98
Prothrombin Time: 12.9 seconds (ref 11.4–15.2)

## 2018-03-06 LAB — MRSA PCR SCREENING: MRSA by PCR: NEGATIVE

## 2018-03-06 LAB — APTT: aPTT: 30 seconds (ref 24–36)

## 2018-03-06 LAB — CBG MONITORING, ED: GLUCOSE-CAPILLARY: 126 mg/dL — AB (ref 70–99)

## 2018-03-06 SURGERY — LEFT HEART CATH AND CORONARY ANGIOGRAPHY
Anesthesia: LOCAL

## 2018-03-06 MED ORDER — ASPIRIN 81 MG PO CHEW
324.0000 mg | CHEWABLE_TABLET | Freq: Once | ORAL | Status: DC
  Filled 2018-03-06: qty 4

## 2018-03-06 MED ORDER — HEPARIN SODIUM (PORCINE) 5000 UNIT/ML IJ SOLN
60.0000 [IU]/kg | Freq: Once | INTRAMUSCULAR | Status: DC
  Filled 2018-03-06: qty 1

## 2018-03-06 MED ORDER — HEPARIN (PORCINE) IN NACL 1000-0.9 UT/500ML-% IV SOLN
INTRAVENOUS | Status: DC | PRN
  Administered 2018-03-06 (×2): 500 mL

## 2018-03-06 MED ORDER — MIDAZOLAM HCL 2 MG/2ML IJ SOLN
INTRAMUSCULAR | Status: DC | PRN
  Administered 2018-03-06: 1 mg via INTRAVENOUS

## 2018-03-06 MED ORDER — ASPIRIN 325 MG PO TABS
ORAL_TABLET | ORAL | Status: AC
  Filled 2018-03-06: qty 1

## 2018-03-06 MED ORDER — MORPHINE SULFATE (PF) 2 MG/ML IV SOLN: 2.0000 mg | INTRAVENOUS | Status: DC | PRN

## 2018-03-06 MED ORDER — LABETALOL HCL 5 MG/ML IV SOLN: 10.0000 mg | INTRAVENOUS | Status: AC | PRN

## 2018-03-06 MED ORDER — VERAPAMIL HCL 2.5 MG/ML IV SOLN
INTRAVENOUS | Status: AC
  Filled 2018-03-06: qty 2

## 2018-03-06 MED ORDER — ATORVASTATIN CALCIUM 80 MG PO TABS
80.0000 mg | ORAL_TABLET | Freq: Every day | ORAL | Status: DC
  Administered 2018-03-06 – 2018-03-09 (×4): 80 mg via ORAL
  Filled 2018-03-06 (×5): qty 1

## 2018-03-06 MED ORDER — HEPARIN (PORCINE) IN NACL 1000-0.9 UT/500ML-% IV SOLN
INTRAVENOUS | Status: AC
  Filled 2018-03-06: qty 500

## 2018-03-06 MED ORDER — NITROGLYCERIN 1 MG/10 ML FOR IR/CATH LAB
INTRA_ARTERIAL | Status: DC | PRN
  Administered 2018-03-06: 200 ug via INTRACORONARY

## 2018-03-06 MED ORDER — TICAGRELOR 90 MG PO TABS
ORAL_TABLET | ORAL | Status: DC | PRN
  Administered 2018-03-06: 180 mg via ORAL

## 2018-03-06 MED ORDER — FENTANYL CITRATE (PF) 100 MCG/2ML IJ SOLN
INTRAMUSCULAR | Status: AC
  Filled 2018-03-06: qty 2

## 2018-03-06 MED ORDER — MIDAZOLAM HCL 2 MG/2ML IJ SOLN
INTRAMUSCULAR | Status: AC
  Filled 2018-03-06: qty 2

## 2018-03-06 MED ORDER — ASPIRIN EC 81 MG PO TBEC: 81.0000 mg | DELAYED_RELEASE_TABLET | Freq: Every day | ORAL | Status: DC

## 2018-03-06 MED ORDER — FENTANYL CITRATE (PF) 100 MCG/2ML IJ SOLN
INTRAMUSCULAR | Status: DC | PRN
  Administered 2018-03-06 (×2): 25 ug via INTRAVENOUS

## 2018-03-06 MED ORDER — SODIUM CHLORIDE 0.9% FLUSH
3.0000 mL | Freq: Two times a day (BID) | INTRAVENOUS | Status: DC
  Administered 2018-03-07 – 2018-03-10 (×6): 3 mL via INTRAVENOUS

## 2018-03-06 MED ORDER — ESMOLOL HCL-SODIUM CHLORIDE 2000 MG/100ML IV SOLN
25.0000 ug/kg/min | INTRAVENOUS | Status: DC
  Administered 2018-03-06: 50 ug/kg/min via INTRAVENOUS
  Administered 2018-03-06 (×2): 25 ug/kg/min via INTRAVENOUS
  Administered 2018-03-06: 50 ug/kg/min via INTRAVENOUS
  Filled 2018-03-06: qty 100

## 2018-03-06 MED ORDER — SODIUM CHLORIDE 0.9 % IV BOLUS
1000.0000 mL | Freq: Once | INTRAVENOUS | Status: AC
  Administered 2018-03-06: 1000 mL via INTRAVENOUS

## 2018-03-06 MED ORDER — HEPARIN SODIUM (PORCINE) 1000 UNIT/ML IJ SOLN
INTRAMUSCULAR | Status: DC | PRN
  Administered 2018-03-06: 2000 [IU] via INTRAVENOUS
  Administered 2018-03-06: 6000 [IU] via INTRAVENOUS

## 2018-03-06 MED ORDER — HYDROMORPHONE HCL 1 MG/ML IJ SOLN
1.0000 mg | Freq: Once | INTRAMUSCULAR | Status: AC
  Administered 2018-03-06: 1 mg via INTRAVENOUS
  Filled 2018-03-06: qty 1

## 2018-03-06 MED ORDER — HYDRALAZINE HCL 20 MG/ML IJ SOLN: 5.0000 mg | INTRAMUSCULAR | Status: AC | PRN

## 2018-03-06 MED ORDER — ACETAMINOPHEN 325 MG PO TABS
650.0000 mg | ORAL_TABLET | ORAL | Status: DC | PRN
  Administered 2018-03-06: 650 mg via ORAL
  Filled 2018-03-06: qty 2

## 2018-03-06 MED ORDER — SODIUM CHLORIDE 0.9 % IV SOLN
250.0000 mL | INTRAVENOUS | Status: DC | PRN
  Administered 2018-03-08: 250 mL via INTRAVENOUS

## 2018-03-06 MED ORDER — TICAGRELOR 90 MG PO TABS
ORAL_TABLET | ORAL | Status: AC
  Filled 2018-03-06: qty 2

## 2018-03-06 MED ORDER — ASPIRIN 81 MG PO CHEW
CHEWABLE_TABLET | ORAL | Status: DC | PRN
  Administered 2018-03-06: 324 mg via ORAL

## 2018-03-06 MED ORDER — SODIUM CHLORIDE 0.9 % IV SOLN
INTRAVENOUS | Status: AC
  Administered 2018-03-06: 13:00:00 via INTRAVENOUS

## 2018-03-06 MED ORDER — ESMOLOL HCL-SODIUM CHLORIDE 2000 MG/100ML IV SOLN
25.0000 ug/kg/min | INTRAVENOUS | Status: DC
  Administered 2018-03-06: 50 ug/kg/min via INTRAVENOUS
  Filled 2018-03-06 (×2): qty 100

## 2018-03-06 MED ORDER — SODIUM CHLORIDE 0.9% FLUSH: 3.0000 mL | INTRAVENOUS | Status: DC | PRN

## 2018-03-06 MED ORDER — ASPIRIN EC 81 MG PO TBEC
81.0000 mg | DELAYED_RELEASE_TABLET | Freq: Every day | ORAL | Status: DC
  Administered 2018-03-07 – 2018-03-10 (×4): 81 mg via ORAL
  Filled 2018-03-06 (×4): qty 1

## 2018-03-06 MED ORDER — SODIUM CHLORIDE 0.9 % IV SOLN
INTRAVENOUS | Status: DC
  Administered 2018-03-06: 10:00:00 via INTRAVENOUS

## 2018-03-06 MED ORDER — TICAGRELOR 90 MG PO TABS
90.0000 mg | ORAL_TABLET | Freq: Two times a day (BID) | ORAL | Status: DC
  Administered 2018-03-06 – 2018-03-10 (×8): 90 mg via ORAL
  Filled 2018-03-06 (×8): qty 1

## 2018-03-06 MED ORDER — LIDOCAINE HCL (PF) 1 % IJ SOLN
INTRAMUSCULAR | Status: AC
  Filled 2018-03-06: qty 30

## 2018-03-06 MED ORDER — NITROGLYCERIN IN D5W 200-5 MCG/ML-% IV SOLN
INTRAVENOUS | Status: AC
  Filled 2018-03-06: qty 250

## 2018-03-06 MED ORDER — LIDOCAINE HCL (PF) 1 % IJ SOLN
INTRAMUSCULAR | Status: DC | PRN
  Administered 2018-03-06: 5 mL via SUBCUTANEOUS

## 2018-03-06 MED ORDER — VERAPAMIL HCL 2.5 MG/ML IV SOLN
INTRAVENOUS | Status: DC | PRN
  Administered 2018-03-06: 10 mL via INTRA_ARTERIAL

## 2018-03-06 MED ORDER — NITROGLYCERIN 0.4 MG SL SUBL
0.4000 mg | SUBLINGUAL_TABLET | Freq: Once | SUBLINGUAL | Status: AC
  Administered 2018-03-06: 0.4 mg via SUBLINGUAL
  Filled 2018-03-06: qty 1

## 2018-03-06 MED ORDER — IOPAMIDOL (ISOVUE-370) INJECTION 76%
100.0000 mL | Freq: Once | INTRAVENOUS | Status: AC | PRN
  Administered 2018-03-06: 100 mL via INTRAVENOUS

## 2018-03-06 SURGICAL SUPPLY — 22 items
BALLN SAPPHIRE 2.5X12 (BALLOONS) ×2
BALLN SAPPHIRE ~~LOC~~ 3.75X10 (BALLOONS) ×2 IMPLANT
BALLOON SAPPHIRE 2.5X12 (BALLOONS) ×1 IMPLANT
CATH INFINITI 5FR ANG PIGTAIL (CATHETERS) ×2 IMPLANT
CATH OPTITORQUE TIG 4.0 5F (CATHETERS) ×2 IMPLANT
CATH VISTA GUIDE 6FR XBLAD3.5 (CATHETERS) ×2 IMPLANT
DEVICE RAD COMP TR BAND LRG (VASCULAR PRODUCTS) ×2 IMPLANT
ELECT DEFIB PAD ADLT CADENCE (PAD) ×2 IMPLANT
GLIDESHEATH SLEND A-KIT 6F 22G (SHEATH) ×2 IMPLANT
GUIDEWIRE INQWIRE 1.5J.035X260 (WIRE) ×1 IMPLANT
HOVERMATT SINGLE USE (MISCELLANEOUS) ×2 IMPLANT
INQWIRE 1.5J .035X260CM (WIRE) ×2
KIT ENCORE 26 ADVANTAGE (KITS) ×2 IMPLANT
KIT HEART LEFT (KITS) ×2 IMPLANT
PACK CARDIAC CATHETERIZATION (CUSTOM PROCEDURE TRAY) ×2 IMPLANT
SHEATH PROBE COVER 6X72 (BAG) ×2 IMPLANT
STENT SIERRA 3.50 X 15 MM (Permanent Stent) ×2 IMPLANT
SYR MEDRAD MARK 7 150ML (SYRINGE) ×2 IMPLANT
TRANSDUCER W/STOPCOCK (MISCELLANEOUS) ×2 IMPLANT
TUBING CIL FLEX 10 FLL-RA (TUBING) ×2 IMPLANT
WIRE MARVEL STR TIP 190CM (WIRE) ×2 IMPLANT
WIRE MINAMO 190 (WIRE) ×2 IMPLANT

## 2018-03-06 NOTE — ED Provider Notes (Signed)
[facility] Emergency Department Provider Note MRN:  [id number]  Arrival date & time: 03/06/18     Chief Complaint   Code STEMI   History of Present Illness   Dima Ferrufino is a 57 y.o. year-old male with a history of hypertension, stroke presenting to the ED with chief complaint of code STEMI.  Central chest pain and right-sided abdominal pain for 1 week, chest pain became severe last night.  Chest pain radiates to the abdomen.  Patient endorsing left arm weakness yesterday that is now resolved and back to normal.  Review of Systems  A complete 10 system review of systems was obtained and all systems are negative except as noted in the HPI and PMH.   Patient's Health History    Past Medical History:  Diagnosis Date  . Complete left bundle branch block (LBBB) 2016  . Current every day smoker   . Hypertension   . TIA (transient ischemic attack) 2016  . Warthin's tumor     Past Surgical History:  Procedure Laterality Date  . TRANSTHORACIC ECHOCARDIOGRAM  2016   normal Echo.  Normal EF 60-65%. No RWMA & normal valves.    Family History  Problem Relation Age of Onset  . Stroke Mother   . Hypertension Mother   . Diabetes type II Mother   . Leukemia Other   . Breast cancer Other   . CAD Neg Hx     Social History   Socioeconomic History  . Marital status: Married    Spouse name: Not on file  . Number of children: Not on file  . Years of education: Not on file  . Highest education level: Not on file  Occupational History  . Not on file  Social Needs  . Financial resource strain: Not on file  . Food insecurity:    Worry: Not on file    Inability: Not on file  . Transportation needs:    Medical: Not on file    Non-medical: Not on file  Tobacco Use  . Smoking status: Current Every Day Smoker    Packs/day: 0.50    Types: Cigarettes  Substance and Sexual Activity  . Alcohol use: Yes    Alcohol/week: 2.0 standard drinks    Types: 2 Cans  of beer per week    Comment: 2 Beers daily.  . Drug use: Yes    Frequency: 3.0 times per week    Types: Marijuana  . Sexual activity: Not on file  Lifestyle  . Physical activity:    Days per week: Not on file    Minutes per session: Not on file  . Stress: Not on file  Relationships  . Social connections:    Talks on phone: Not on file    Gets together: Not on file    Attends religious service: Not on file    Active member of club or organization: Not on file    Attends meetings of clubs or organizations: Not on file    Relationship status: Not on file  . Intimate partner violence:    Fear of current or ex partner: Not on file    Emotionally abused: Not on file    Physically abused: Not on file    Forced sexual activity: Not on file  Other Topics Concern  . Not on file  Social History Narrative  . Not on file     Physical Exam  Vital Signs and Nursing Notes reviewed Vitals:   03/06/18 0932  03/06/18 0948  BP: (!) 130/99 (!) 135/108  Pulse: (!) 142 (!) 130  Resp: 18 19  Temp: 97.7 F (36.5 C)   SpO2: 100% 98%    CONSTITUTIONAL: Ill-appearing, NAD, in moderate distress due to pain NEURO:  Alert and oriented x 3, no focal deficits EYES:  eyes equal and reactive ENT/NECK:  no LAD, no JVD; large nodule to right side of the neck CARDIO: Tachycardic rate, well-perfused, normal S1 and S2 PULM:  CTAB no wheezing or rhonchi GI/GU:  normal bowel sounds, non-distended, non-tender MSK/SPINE:  No gross deformities, no edema SKIN:  no rash, atraumatic PSYCH:  Appropriate speech and behavior  Diagnostic and Interventional Summary    EKG Interpretation  Date/Time:  Saturday March 06 2018 09:45:03 EST Ventricular Rate:  130 PR Interval:    QRS Duration: 126 QT Interval:  291 QTC Calculation: 428 R Axis:   90 Text Interpretation:  Sinus tachycardia LAE, consider biatrial enlargement Nonspecific intraventricular conduction delay Probable lateral infarct, age indeterminate  Probable anteroseptal infarct, recent Confirmed by Gerlene Fee [phone]) on 03/06/2018 9:49:21 AM      Labs Reviewed  CBC WITH DIFFERENTIAL/PLATELET - Abnormal; Notable for the following components:      Result Value   WBC 14.1 (*)    RBC 6.16 (*)    MCH 25.5 (*)    RDW 20.5 (*)    Neutro Abs 8.8 (*)    Monocytes Absolute 2.1 (*)    All other components within normal limits  TROPONIN I - Abnormal; Notable for the following components:   Troponin I 27.69 (*)    All other components within normal limits  CBG MONITORING, ED - Abnormal; Notable for the following components:   Glucose-Capillary 126 (*)    All other components within normal limits  PROTIME-INR  APTT  COMPREHENSIVE METABOLIC PANEL  LIPID PANEL    CT ANGIO CHEST/ABD/PEL FOR DISSECTION W &/OR WO CONTRAST  Final Result    DG Chest Portable 1 View  Final Result      Medications  0.9 %  sodium chloride infusion ( Intravenous New Bag/Given 03/06/18 1025)  aspirin chewable tablet 324 mg (324 mg Oral Not Given 03/06/18 0936)  esmolol (BREVIBLOC) 2000 mg / 100 mL (20 mg/mL) infusion (25 mcg/kg/min  66.7 kg Intravenous New Bag/Given 03/06/18 1025)  HYDROmorphone (DILAUDID) injection 1 mg (1 mg Intravenous Given 03/06/18 0945)  sodium chloride 0.9 % bolus 1,000 mL (1,000 mLs Intravenous New Bag/Given 03/06/18 0943)  nitroGLYCERIN (NITROSTAT) SL tablet 0.4 mg (0.4 mg Sublingual Given 03/06/18 0948)  iopamidol (ISOVUE-370) 76 % injection 100 mL (100 mLs Intravenous Contrast Given 03/06/18 0957)     Procedures Critical Care Critical Care Documentation Critical care time provided by me (excluding procedures): 55 minutes  Condition necessitating critical care: ST elevation myocardial infarction, initial concern for aortic dissection  Components of critical care management: reviewing of prior records, laboratory and imaging interpretation, frequent re-examination and reassessment of vital signs, administration of IV  esmolol, discussion with consulting services, facilitation of transfer    ED Course and Medical Decision Making  I have reviewed the triage vital signs and the nursing notes.  Pertinent labs & imaging results that were available during my care of the patient were reviewed by me and considered in my medical decision making (see below for details).  Concern for ST elevation myocardial infarction in this 57 year old male with severe chest pain, impressive discordant changes on EKG with left bundle, markedly changed from prior on record.  STEMI alert called.  Patient's pain resolved and EKG changes also resolved.  Also concern for possible dissection given the radiation to the abdomen, the left arm weakness yesterday.  Patient currently has a normal neurological exam.  Discussed with cardiology, given the resolution of EKG changes, resolution of chest pain, active concern for dissection, will obtain CTA imaging here and transfer to [facility] for cardiology evaluation.  Heart rate remains 130s, will start on esmolol drip.  Clinical Course as of Mar 07 1051  Sat Mar 06, 2018  1027 Repeat EKG when patient returns back from CTA with return of concerning features, concern for STEMI.  Discussed again with cardiology, who is still not fully convinced that this is a true STEMI, but would be willing to emergently cath.  The issue is the remaining question of possible dissection.  We have requested a stat reviewed by radiology, awaiting a call.   [MB]  1028 Regardless, transport is on the way to bring patient to [facility], remaining question bring him to the ED versus directly to the Cath Lab.   [MB]    Clinical Course User Index [MB] Maudie Flakes, MD    Called back by radiology, no acute dissection.  Spoke again with Dr. Ellyn Hack of cardiology, who agrees with paging out the code STEMI to facilitate emergent catheterization.  Communication loop closed with [facility], who will inform EMS that patient would  ideally go straight to the Cath Lab.  Dr. Ellyn Hack warns [place] that patient might have to be in the ED for a few minutes while they get ready.  Dr. Zenia Resides at [facility] accepting transfer doctor made aware.  Patient transport was initiated prior to CTA result findings, has not received aspirin or heparin given the concern for dissection, currently on esmolol drip.  Barth Kirks. Sedonia Small, MD [place] [email or web address]  Final Clinical Impressions(s) / ED Diagnoses     ICD-10-CM   1. ST elevation myocardial infarction (STEMI), unspecified artery [facility]) I21.3     ED Discharge Orders    None         Maudie Flakes, MD 03/06/18 1052

## 2018-03-06 NOTE — Progress Notes (Signed)
   03/06/18 1100  Clinical Encounter Type  Visited With Patient;Health care provider;Patient and family together;Family  Visit Type Initial;Psychological support;ED;Code;Other (Comment) (STEMI)  Referral From Other (Comment) (STEMI pg)  Spiritual Encounters  Spiritual Needs Emotional  Stress Factors  Patient Stress Factors Health changes   Met w/ pt at bridge in ED, arrived as code STEMI, went to ED lobby to find pt's sister, J. March Rummage, took her bk to pt, accompanied her up to [facility] waiting rm when pt went to cath.  Let cath lab staff know she was out there.  Chaplain remains available.  Pt works at [employer].  Myra Gianotti resident, [phone]

## 2018-03-06 NOTE — ED Notes (Signed)
Date and time results received: 03/06/18 1039   Test:trp Critical Value: 27 Name of Provider Notified: Bero Orders Received? Or Actions Taken?: no orders given

## 2018-03-06 NOTE — ED Triage Notes (Signed)
Pt reports sharp intermittent left sided CP radiating into his left arm x 1 week. Denies N/V SOB. Reports some dizziness with the episodes.

## 2018-03-06 NOTE — H&P (Signed)
Cardiology Admission History and Physical:   Patient ID: Daniel Weiss MRN: [id number]; DOB: May 29, 1960   Admission date: 03/06/2018  Primary Care Provider: Patient, No Pcp Per Primary Cardiologist: Glenetta Hew, MD  New Primary Electrophysiologist:  None   Chief Complaint: Recurrent chest pain  Patient Profile:   Daniel Weiss is a 57 y.o. male with longstanding smoking history and hypertension who presented to [facility] with complaints of intermittent chest pain over the last week.   History of Present Illness:   Daniel Weiss has never had any cardiac issue but has had a history of TIA.  He began having chest pain roughly 1 week ago that he noted was worse when he lie down at night.  It actually got better when he would get up and walk around.  Each episode would last 15 to 20 minutes.  He noted that he is been having chest pain every morning since then and this morning it was more severe.  He noted that his heart rate was really fast and is very dizzy when he try to get up and walk.  He therefore decided to go to the emergency room. In the emergency room, he had intermittent episodes of chest discomfort and had several EKGs checked.  He was noted to have significant sinus tachycardia which made left bundle-branch block difficult to read however there was concern for criteria for possible STEMI.  However repeat EKGs in the absence of chest pain did not show similar findings.  (On review it looks like this may have been due to lead placement).  There was concern of possible aortic dissection because he has had significant weakness in the left arm and abdominal pain as well as sharp back pain.  He was therefore taken for stat CT angiogram of the chest to exclude aortic dissection.  This did not show evidence of dissection and he was already in route to [facility] as a troponin level came back at greater than 27.  Because of his sinus tachycardia and ongoing chest pain with concerns of  aortic dissection he was placed on an esmolol drip that has been titrated up to 75 mcg/kg/min - increased to 100 @ [facility] ER.  Heart rate was in the 110s upon arrival to [facility] his blood pressures were in the 150s.  He did not receive aspirin or heparin because of concern for dissection and the transportation was arranged prior to getting the read for no aortic dissection.  Based on the fact that he has intermittently had chest pain and was currently chest pain-free upon arrival to [facility], this is mostly consistent with her recent MI either STEMI or non-STEMI and now having post infarct angina.  He states he is still having intermittent angina and clearly has elevated troponin we will take emergently to cardiac catheterization lab to exclude significant CAD and treat accordingly.  Other cardiac review of symptoms, noted tachycardia but no irregular heartbeats.  No syncope or near syncope just feeling lightheaded and dizzy today.  No PND orthopnea just the anginal chest pain with lying down.   Past Medical History:  Diagnosis Date  . Complete left bundle branch block (LBBB) 2016  . Current every day smoker   . Hypertension   . TIA (transient ischemic attack) 2016  . Warthin's tumor     Past Surgical History:  Procedure Laterality Date  . TRANSTHORACIC ECHOCARDIOGRAM  2016   normal Echo.  Normal EF 60-65%. No RWMA & normal valves.  Medications Prior to Admission: Prior to Admission medications   Medication Sig Start Date End Date Taking? Authorizing Provider  acetaminophen (TYLENOL) 325 MG tablet Take 2 tablets (650 mg total) by mouth every 4 (four) hours as needed for mild pain (temperature >/= 99.5 F). 07/08/14   Ahmed, Chesley Mires, MD  amLODipine (NORVASC) 2.5 MG tablet Take 1 tablet (2.5 mg total) by mouth daily. 07/01/15   Florencia Reasons, MD  aspirin EC 81 MG EC tablet Take 1 tablet (81 mg total) by mouth daily. 07/08/14   Dellia Nims, MD  atorvastatin (LIPITOR) 40 MG tablet Take  1 tablet (40 mg total) by mouth daily. 07/14/14 07/14/15  Dellia Nims, MD     Allergies:   No Known Allergies  Social History:   Social History   Socioeconomic History  . Marital status: Married    Spouse name: Not on file  . Number of children: Not on file  . Years of education: Not on file  . Highest education level: Not on file  Occupational History  . Not on file  Social Needs  . Financial resource strain: Not on file  . Food insecurity:    Worry: Not on file    Inability: Not on file  . Transportation needs:    Medical: Not on file    Non-medical: Not on file  Tobacco Use  . Smoking status: Current Every Day Smoker    Packs/day: 0.50    Types: Cigarettes  Substance and Sexual Activity  . Alcohol use: Yes    Alcohol/week: 2.0 standard drinks    Types: 2 Cans of beer per week    Comment: 2 Beers daily.  . Drug use: Yes    Frequency: 3.0 times per week    Types: Marijuana  . Sexual activity: Not on file  Lifestyle  . Physical activity:    Days per week: Not on file    Minutes per session: Not on file  . Stress: Not on file  Relationships  . Social connections:    Talks on phone: Not on file    Gets together: Not on file    Attends religious service: Not on file    Active member of club or organization: Not on file    Attends meetings of clubs or organizations: Not on file    Relationship status: Not on file  . Intimate partner violence:    Fear of current or ex partner: Not on file    Emotionally abused: Not on file    Physically abused: Not on file    Forced sexual activity: Not on file  Other Topics Concern  . Not on file  Social History Narrative  . Not on file    Family History:  The patient's family history includes Breast cancer in an other family member; Diabetes type II in his mother; Hypertension in his mother; Leukemia in an other family member; Stroke in his mother. There is no history of CAD.    ROS:  Please see the history of present  illness.  Review of Systems  Constitutional: Positive for malaise/fatigue (Has definitely felt more fatigued over the last week). Negative for weight loss.  HENT: Negative for congestion and nosebleeds.   Respiratory: Positive for shortness of breath (Short of breath this morning). Negative for cough and wheezing.   Cardiovascular: Negative for leg swelling.  Gastrointestinal: Positive for abdominal pain. Negative for blood in stool, constipation and melena.  Genitourinary: Negative for hematuria.  Musculoskeletal: Positive for back pain (  Today).  Neurological: Positive for focal weakness (Was unable to raise his left arm today.).  All other systems reviewed and are negative.    Physical Exam/Data:   Vitals:   03/06/18 0928 03/06/18 0932 03/06/18 0948 03/06/18 1128  BP:  (!) 130/99 (!) 135/108   Pulse:  (!) 142 (!) 130   Resp:  18 19   Temp:  97.7 F (36.5 C)    SpO2:  100% 98% 96%  Weight: 66.7 kg     Height: 6' (1.829 m)      No intake or output data in the 24 hours ending 03/06/18 1319 Filed Weights   03/06/18 0928  Weight: 66.7 kg   Body mass index is 19.94 kg/m.  General:  Well nourished, well developed, in no acute distress; upon arrival to the ER he was chest pain-free and comfortable. HEENT: [place]/AT, EOMI. 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  No obvious M/R/G.   Lungs:  clear to auscultation bilaterally, no wheezing, rhonchi or rales  Abd: soft, nontender, no hepatomegaly  Ext: no C/C/C Musculoskeletal:  No deformities, BUE and BLE strength normal and equal Skin: warm and dry  Neuro:  CNs 2-12 intact, no focal abnormalities noted Psych:  Normal affect   EKG:  The ECG that was done at [facility] was personally reviewed and demonstrates sinus tachycardia rates in the 130 or 140 range depending on which  EKG with LBBB and borderline Scarbosa criteria for ST elevation MI.  Relevant CV Studies: Prior echo from 2016 reported in [facility]  Laboratory Data:  Chemistry Recent Labs  Lab 03/06/18 0941  NA 132*  K 3.8  CL 94*  CO2 24  GLUCOSE 121*  BUN 16  CREATININE 1.37*  CALCIUM 9.3  GFRNONAA 56*  GFRAA >60  ANIONGAP 14    Recent Labs  Lab 03/06/18 0941  PROT 8.5*  ALBUMIN 3.7  AST 106*  ALT 38  ALKPHOS 73  BILITOT 0.9   Hematology Recent Labs  Lab 03/06/18 0941  WBC 14.1*  RBC 6.16*  HGB 15.7  HCT 49.5  MCV 80.4  MCH 25.5*  MCHC 31.7  RDW 20.5*  PLT 286   Cardiac Enzymes Recent Labs  Lab 03/06/18 0941  TROPONINI 27.69*   No results for input(s): TROPIPOC in the last 168 hours.  BNPNo results for input(s): BNP, PROBNP in the last 168 hours.  DDimer No results for input(s): DDIMER in the last 168 hours.  Radiology/Studies:  Dg Chest Portable 1 View  Result Date: 03/06/2018 CLINICAL DATA:  Pt reports sharp intermittent left sided CP radiating into his left arm x 1 week. Denies N/V SOB. Reports some dizziness with the episodes EXAM: PORTABLE CHEST - 1 VIEW COMPARISON:  06/29/2015 FINDINGS: Lungs are clear. Heart size and mediastinal contours are within normal limits. No effusion.  No pneumothorax. Visualized bones unremarkable. IMPRESSION: No acute cardiopulmonary disease. Electronically Signed   By: Lucrezia Europe M.D.   On: 03/06/2018 10:42   Ct Angio Chest/abd/pel For Dissection W &/or Wo Contrast  Result Date: 03/06/2018 CLINICAL DATA:  Dizziness; chest pain x 6 hours; no back pain; abdomen pain earlier today; n/v yesterday, none today; SOB; lightheadedness; no heart issues; EXAM: CT ANGIOGRAPHY CHEST, ABDOMEN AND PELVIS TECHNIQUE: Multidetector CT imaging through the chest, abdomen and pelvis was performed using the standard protocol during bolus administration of  intravenous contrast. Multiplanar reconstructed images and MIPs were obtained and reviewed to evaluate  the vascular anatomy. CONTRAST:  17mL ISOVUE-370 IOPAMIDOL (ISOVUE-370) INJECTION 76% COMPARISON:  None. FINDINGS: CTA CHEST FINDINGS Cardiovascular: Heart size normal. Trace pericardial effusion. The RV is nondilated. Good contrast opacification of pulmonary artery branches with no convincing filling defects to suggest acute PE. Breathing motion during the acquisition degrades some of the images. Adequate contrast opacification of the thoracic aorta with no evidence of dissection, aneurysm, or stenosis. Ectatic proximal arch 3 cm diameter. There is bovine variant brachiocephalic arch anatomy without proximal stenosis. Mediastinum/Nodes: No hilar or mediastinal adenopathy. Lungs/Pleura: No pleural effusion. No pneumothorax. Lungs are clear. Musculoskeletal: Mild DJD in bilateral shoulders. Negative for fracture. Nonspecific subcentimeter sclerotic foci in several thoracic vertebral bodies and left twelfth rib. Review of the MIP images confirms the above findings. CTA ABDOMEN AND PELVIS FINDINGS VASCULAR Aorta: Eccentric nonocclusive mural thrombus throughout the ectatic abdominal aorta. No dissection, stenosis, or aneurysm. Scattered calcified atheromatous plaque. Celiac: Patent without evidence of aneurysm, dissection, vasculitis or significant stenosis. SMA: Patent without evidence of aneurysm, dissection, vasculitis or significant stenosis. Renals: Both renal arteries are patent without evidence of aneurysm, dissection, vasculitis, fibromuscular dysplasia or significant stenosis. IMA: Origin stenosis related to aortic wall plaque, patent distally. Inflow: Scattered calcified atheromatous plaque through bilateral common iliac arteries without dissection or stenosis. The right common iliac artery is ectatic up to 1.3 cm, left 1.4 cm. 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 Mild atheromatous change in bilateral external iliac arteries without stenosis.  Visualized proximal outflow patent. Veins: No obvious venous abnormality within the limitations of this arterial phase study. Review of the MIP images confirms the above findings. NON-VASCULAR Hepatobiliary: Several low-attenuation well-defined hepatic lesions largest 1.2 cm in segment 6, presumably cysts in the absence of a history of primary carcinoma. Gallbladder unremarkable. No biliary ductal dilatation. Pancreas: Unremarkable. No pancreatic ductal dilatation or surrounding inflammatory changes. Spleen: Small, unremarkable, without focal lesion. Adrenals/Urinary Tract: Adrenal glands are unremarkable. Kidneys are normal, without renal calculi, focal lesion, or hydronephrosis. Bladder is unremarkable. Stomach/Bowel: Stomach is incompletely distended. Small bowel is nondilated. Appendix is not discretely identified. No pericecal inflammatory/edematous change. The colon is nondilated, unremarkable. Lymphatic: Subcentimeter left para-aortic and aortocaval lymph nodes. No pelvic or mesenteric adenopathy. Reproductive: Marked prostatic enlargement protruding into the urinary bladder. Other: Left pelvic phleboliths. No ascites. No free air. Musculoskeletal: Scattered small sclerotic foci in lumbar spine, sacrum, pelvis, right femoral neck. No fracture or acute abnormality. Review of the MIP images confirms the above findings. IMPRESSION: 1. Negative for acute PE or thoracic aortic dissection. 2. Ectatic 3 cm aortic arch. Recommend annual imaging followup by CTA or MRA. This recommendation follows 2010 ACCF/AHA/AATS/ACR/ASA/SCA/SCAI/SIR/STS/SVM Guidelines for the Diagnosis and Management of Patients with Thoracic Aortic Disease. Circulation.2010; 121: U384-T364 3. Ectatic atheromatous abdominal aorta at risk for aneurysm development. Recommend followup by ultrasound in 5 years. This recommendation follows ACR consensus guidelines: White Paper of the ACR Incidental Findings Committee II on Vascular Findings. J Am Coll  Radiol 2013; 10:789-794. 4. Ectatic bilateral common iliac arteries, 1.4 cm left and 1.3 cm right. 5. Origin occlusion of the left internal iliac artery. 6. Marked prostatic enlargement. 7. Scattered small sclerotic foci in the thoracolumbar spine, sacrum, left twelfth rib, pelvis, and right femur; early osseous metastatic disease versus multiple bone islands are primary considerations. Elective outpatient whole-body bone scintigraphy and PSA may be useful for further evaluation. Critical Value/emergent results  were called by telephone at the time of interpretation on 03/06/2018 at 10:39 am to Dr. Gerlene Fee , who verbally acknowledged these results. Electronically Signed   By: Lucrezia Europe M.D.   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  I think it is more consistent with recent non-STEMI and now post infarct angina.  His EKG is very difficult to interpret and could be consistent with ST elevation MI and with elevated troponin we will take emergently to the cardiac catheterization lab.  We will titrate beta-blocker drip to off and changed to p.o. post-cath. We will see what his blood pressure ranges after being off the beta-blocker drip and consider adding additional agents. Titrate statin dose from 40 mg up to 80 mg. Smoking cessation counseling  Remainder the plan will be per catheterization findings.    Severity of Illness: The appropriate patient status for this patient is INPATIENT. Inpatient status is judged to be reasonable and necessary in order  to provide the required intensity of service to ensure the patient's safety. The patient's presenting symptoms, physical exam findings, and initial radiographic and laboratory data in the context of their chronic comorbidities is felt to place them at high risk for further clinical deterioration. Furthermore, it is not anticipated that the patient will be medically stable for discharge from the hospital within 2 midnights of admission. The following factors support the patient status of inpatient.   " The patient's presenting symptoms include intermittent severe chest pain. " The worrisome physical exam findings include sinus tachycardia and hypertension. " The initial radiographic and laboratory data are worrisome because of EKG showing LBBB with possible criteria for STEMI and elevated troponin of 27. " The chronic co-morbidities include hypertension, prior TIA, smoker.   * I certify that at the point of admission it is my clinical judgment that the patient will require inpatient hospital care spanning beyond 2 midnights from the point of admission due to high intensity of service, high risk for further deterioration and high frequency of surveillance required.*    For questions or updates, please contact [place] Please consult [email or web address] for contact info under        Signed, Glenetta Hew, MD  03/06/2018 1:19 PM

## 2018-03-06 NOTE — ED Notes (Signed)
Portable CXR done.

## 2018-03-06 NOTE — Progress Notes (Signed)
Patient called out to nursing staff using his call light after being woken up from his sleep with severe pain in his right upper arm. Patient requesting nursing staff to remove IV present in his right upper arm. IV removed at patients request without complication. IV noted to be longer length than standard peripheral IVs and LDA review notes IV placed using ultrasound. Immediately after IV removed patient had no further complaints of pain with his right arm. Right arm was no longer sensitive to touch/palpation. 22g peripheral IV placed in left forearm without complication to replace vascular access. Patient very appreciative of nursing staff for removing IV and stopping his right arm pain.

## 2018-03-06 NOTE — ED Notes (Signed)
Patient transported to CT on cardiac monitor with RN.

## 2018-03-06 NOTE — Progress Notes (Signed)
Patient reports right arm pain and numbness felt in his fingertips. Patient noted with having cardiac cath today with a right radial approach; TR band off since 5:30 pm. Vascular site level 0. No apparent hematoma development noted. +2 Palpable radial pulse. Patient sensitive to have upper arm touched. 20g peripheral IV noted in right upper arm infusing NS and previously esmolol. Upper arm very sensitive to touch all around IV site (more so inner arm than lateral upper arm); upper arm more sensitive than forearm and lower. IV site assessed without phlebitis or edema. No apparent signs of infiltration noted. IV flushed and line drawn back with great blood return. No pain noted when flushing IV. Advised patient that IV removal from affected arm is recommended despite good functionality; however patient adamantly refuses to be stuck for replacement peripheral IV tonight. Patient states it can be addressed in the morning. Tylenol administered as ordered PRN pain. On call MD notified and updated. No further orders received at this time. Advised to update MD with any change in assessment.

## 2018-03-07 ENCOUNTER — Inpatient Hospital Stay (HOSPITAL_COMMUNITY): Payer: Medicaid Other

## 2018-03-07 ENCOUNTER — Encounter (HOSPITAL_COMMUNITY): Payer: Self-pay | Admitting: *Deleted

## 2018-03-07 DIAGNOSIS — I10 Essential (primary) hypertension: Secondary | ICD-10-CM

## 2018-03-07 LAB — LIPID PANEL
Cholesterol: 147 mg/dL (ref 0–200)
HDL: 38 mg/dL — AB (ref >40)
LDL Cholesterol: 88 mg/dL (ref 0–99)
TRIGLYCERIDES: 107 mg/dL (ref <150)
Total CHOL/HDL Ratio: 3.9 RATIO
VLDL: 21 mg/dL (ref 0–40)

## 2018-03-07 LAB — TROPONIN I
TROPONIN I: 25.8 ng/mL — AB (ref <0.03)
Troponin I: 23.94 ng/mL (ref <0.03)

## 2018-03-07 LAB — BASIC METABOLIC PANEL
Anion gap: 11 (ref 5–15)
BUN: <5 mg/dL — ABNORMAL LOW (ref 6–20)
CO2: 25 mmol/L (ref 22–32)
Calcium: 8.7 mg/dL — ABNORMAL LOW (ref 8.9–10.3)
Chloride: 100 mmol/L (ref 98–111)
Creatinine, Ser: 0.9 mg/dL (ref 0.61–1.24)
GFR calc Af Amer: >60 mL/min (ref >60)
GFR calc non Af Amer: >60 mL/min (ref >60)
Glucose, Bld: 135 mg/dL — ABNORMAL HIGH (ref 70–99)
Potassium: 3.4 mmol/L — ABNORMAL LOW (ref 3.5–5.1)
Sodium: 136 mmol/L (ref 135–145)

## 2018-03-07 LAB — CBC
HCT: 41.5 % (ref 39.0–52.0)
Hemoglobin: 13.6 g/dL (ref 13.0–17.0)
MCH: 26 pg (ref 26.0–34.0)
MCHC: 32.8 g/dL (ref 30.0–36.0)
MCV: 79.3 fL — ABNORMAL LOW (ref 80.0–100.0)
Platelets: 268 10*3/uL (ref 150–400)
RBC: 5.23 MIL/uL (ref 4.22–5.81)
RDW: 19.1 % — AB (ref 11.5–15.5)
WBC: 9 10*3/uL (ref 4.0–10.5)
nRBC: 0 % (ref 0.0–0.2)

## 2018-03-07 LAB — HEPATIC FUNCTION PANEL
ALT: 24 U/L (ref 0–44)
AST: 62 U/L — ABNORMAL HIGH (ref 15–41)
Albumin: 2.5 g/dL — ABNORMAL LOW (ref 3.5–5.0)
Alkaline Phosphatase: 57 U/L (ref 38–126)
BILIRUBIN INDIRECT: 0.7 mg/dL (ref 0.3–0.9)
BILIRUBIN TOTAL: 0.9 mg/dL (ref 0.3–1.2)
Bilirubin, Direct: 0.2 mg/dL (ref 0.0–0.2)
Total Protein: 6.5 g/dL (ref 6.5–8.1)

## 2018-03-07 LAB — ECHOCARDIOGRAM COMPLETE
HEIGHTINCHES: 72 in
Weight: 2352 oz

## 2018-03-07 LAB — HEMOGLOBIN A1C
Hgb A1c MFr Bld: 5.3 % (ref 4.8–5.6)
Mean Plasma Glucose: 105.41 mg/dL

## 2018-03-07 LAB — GLUCOSE, CAPILLARY: Glucose-Capillary: 102 mg/dL — ABNORMAL HIGH (ref 70–99)

## 2018-03-07 MED ORDER — POTASSIUM CHLORIDE CRYS ER 20 MEQ PO TBCR
40.0000 meq | EXTENDED_RELEASE_TABLET | Freq: Once | ORAL | Status: AC
  Administered 2018-03-07: 40 meq via ORAL
  Filled 2018-03-07: qty 2

## 2018-03-07 MED ORDER — CARVEDILOL 6.25 MG PO TABS
6.2500 mg | ORAL_TABLET | Freq: Two times a day (BID) | ORAL | Status: DC
  Administered 2018-03-07 – 2018-03-08 (×2): 6.25 mg via ORAL
  Filled 2018-03-07 (×2): qty 1

## 2018-03-07 NOTE — Progress Notes (Signed)
Progress Note  Patient Name: Daniel Weiss Date of Encounter: 03/07/2018  Primary Cardiologist:   Glenetta Hew, MD   Subjective   He denies any pain.  No SOB.    Inpatient Medications    Scheduled Meds: . aspirin EC  81 mg Oral Daily  . atorvastatin  80 mg Oral q1800  . sodium chloride flush  3 mL Intravenous Q12H  . ticagrelor  90 mg Oral BID   Continuous Infusions: . sodium chloride    . esmolol Stopped (03/06/18 2045)   PRN Meds: sodium chloride, acetaminophen, morphine injection, sodium chloride flush   Vital Signs    Vitals:   03/07/18 0730 03/07/18 0752 03/07/18 0800 03/07/18 0830  BP: (!) 138/94  (!) 131/99 107/76  Pulse: 91  97 (!) 105  Resp: (!) 23  20 20   Temp:  98.5 F (36.9 C)    TempSrc:  Oral    SpO2: 98%  97% 97%  Weight:      Height:        Intake/Output Summary (Last 24 hours) at 03/07/2018 0918 Last data filed at 03/07/2018 3086 Gross per 24 hour  Intake 1700.33 ml  Output 1700 ml  Net 0.33 ml   Filed Weights   03/06/18 0928  Weight: 66.7 kg    Telemetry    Sinus tachycardia - Personally Reviewed  ECG    Sinus tachycardia, LBBB, anterior ST elevation meeting criteria for injury pattern - Personally Reviewed  Physical Exam   GEN: No acute distress.   Neck: No  JVD Cardiac: RRR, no murmurs, rubs, or gallops.  Respiratory: Clear  to auscultation bilaterally. GI: Soft, nontender, non-distended  MS: No  edema; No deformity.  Right radial site intact Neuro:  Nonfocal  Psych: Normal affect   Labs    Chemistry Recent Labs  Lab 03/06/18 0941 03/07/18 0812  NA 132* 136  K 3.8 3.4*  CL 94* 100  CO2 24 25  GLUCOSE 121* 135*  BUN 16 <5*  CREATININE 1.37* 0.90  CALCIUM 9.3 8.7*  PROT 8.5* 6.5  ALBUMIN 3.7 2.5*  AST 106* 62*  ALT 38 24  ALKPHOS 73 57  BILITOT 0.9 0.9  GFRNONAA 56* >60  GFRAA >60 >60  ANIONGAP 14 11     Hematology Recent Labs  Lab 03/06/18 0941 03/07/18 0812  WBC 14.1* 9.0  RBC 6.16* 5.23    HGB 15.7 13.6  HCT 49.5 41.5  MCV 80.4 79.3*  MCH 25.5* 26.0  MCHC 31.7 32.8  RDW 20.5* 19.1*  PLT 286 268    Cardiac Enzymes Recent Labs  Lab 03/06/18 1517 03/06/18 1904 03/07/18 0108 03/07/18 0812  TROPONINI 30.65* 28.85* 25.80* 23.94*   No results for input(s): TROPIPOC in the last 168 hours.   BNPNo results for input(s): BNP, PROBNP in the last 168 hours.   DDimer No results for input(s): DDIMER in the last 168 hours.   Radiology    Dg Chest Portable 1 View  Result Date: 03/06/2018 CLINICAL DATA:  Pt reports sharp intermittent left sided CP radiating into his left arm x 1 week. Denies N/V SOB. Reports some dizziness with the episodes EXAM: PORTABLE CHEST - 1 VIEW COMPARISON:  06/29/2015 FINDINGS: Lungs are clear. Heart size and mediastinal contours are within normal limits. No effusion.  No pneumothorax. Visualized bones unremarkable. IMPRESSION: No acute cardiopulmonary disease. Electronically Signed   By: Lucrezia Europe M.D.   On: 03/06/2018 10:42   Ct Angio Chest/abd/pel For Dissection W &/or Wo  Contrast  Result Date: 03/06/2018 CLINICAL DATA:  Dizziness; chest pain x 6 hours; no back pain; abdomen pain earlier today; n/v yesterday, none today; SOB; lightheadedness; no heart issues; EXAM: CT ANGIOGRAPHY CHEST, ABDOMEN AND PELVIS TECHNIQUE: Multidetector CT imaging through the chest, abdomen and pelvis was performed using the standard protocol during bolus administration of intravenous contrast. Multiplanar reconstructed images and MIPs were obtained and reviewed to evaluate the vascular anatomy. CONTRAST:  166mL ISOVUE-370 IOPAMIDOL (ISOVUE-370) INJECTION 76% COMPARISON:  None. FINDINGS: CTA CHEST FINDINGS Cardiovascular: Heart size normal. Trace pericardial effusion. The RV is nondilated. Good contrast opacification of pulmonary artery branches with no convincing filling defects to suggest acute PE. Breathing motion during the acquisition degrades some of the images. Adequate  contrast opacification of the thoracic aorta with no evidence of dissection, aneurysm, or stenosis. Ectatic proximal arch 3 cm diameter. There is bovine variant brachiocephalic arch anatomy without proximal stenosis. Mediastinum/Nodes: No hilar or mediastinal adenopathy. Lungs/Pleura: No pleural effusion. No pneumothorax. Lungs are clear. Musculoskeletal: Mild DJD in bilateral shoulders. Negative for fracture. Nonspecific subcentimeter sclerotic foci in several thoracic vertebral bodies and left twelfth rib. Review of the MIP images confirms the above findings. CTA ABDOMEN AND PELVIS FINDINGS VASCULAR Aorta: Eccentric nonocclusive mural thrombus throughout the ectatic abdominal aorta. No dissection, stenosis, or aneurysm. Scattered calcified atheromatous plaque. Celiac: Patent without evidence of aneurysm, dissection, vasculitis or significant stenosis. SMA: Patent without evidence of aneurysm, dissection, vasculitis or significant stenosis. Renals: Both renal arteries are patent without evidence of aneurysm, dissection, vasculitis, fibromuscular dysplasia or significant stenosis. IMA: Origin stenosis related to aortic wall plaque, patent distally. Inflow: Scattered calcified atheromatous plaque through bilateral common iliac arteries without dissection or stenosis. The right common iliac artery is ectatic up to 1.3 cm, left 1.4 cm. 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 Mild atheromatous change in bilateral external iliac arteries without stenosis. Visualized proximal outflow patent. Veins: No obvious venous abnormality within the limitations of this arterial phase study. Review of the MIP images confirms the above findings. NON-VASCULAR Hepatobiliary: Several low-attenuation well-defined hepatic lesions largest 1.2 cm in segment 6, presumably cysts in the absence of a history of primary carcinoma. Gallbladder unremarkable. No biliary ductal dilatation.  Pancreas: Unremarkable. No pancreatic ductal dilatation or surrounding inflammatory changes. Spleen: Small, unremarkable, without focal lesion. Adrenals/Urinary Tract: Adrenal glands are unremarkable. Kidneys are normal, without renal calculi, focal lesion, or hydronephrosis. Bladder is unremarkable. Stomach/Bowel: Stomach is incompletely distended. Small bowel is nondilated. Appendix is not discretely identified. No pericecal inflammatory/edematous change. The colon is nondilated, unremarkable. Lymphatic: Subcentimeter left para-aortic and aortocaval lymph nodes. No pelvic or mesenteric adenopathy. Reproductive: Marked prostatic enlargement protruding into the urinary bladder. Other: Left pelvic phleboliths. No ascites. No free air. Musculoskeletal: Scattered small sclerotic foci in lumbar spine, sacrum, pelvis, right femoral neck. No fracture or acute abnormality. Review of the MIP images confirms the above findings. IMPRESSION: 1. Negative for acute PE or thoracic aortic dissection. 2. Ectatic 3 cm aortic arch. Recommend annual imaging followup by CTA or MRA. This recommendation follows 2010 ACCF/AHA/AATS/ACR/ASA/SCA/SCAI/SIR/STS/SVM Guidelines for the Diagnosis and Management of Patients with Thoracic Aortic Disease. Circulation.2010; 121: N003-B048 3. Ectatic atheromatous abdominal aorta at risk for aneurysm development. Recommend followup by ultrasound in 5 years. This recommendation follows ACR consensus guidelines: White Paper of the ACR Incidental Findings Committee II on Vascular Findings. J Am Coll Radiol 2013; 10:789-794. 4. Ectatic bilateral common iliac arteries, 1.4 cm left and 1.3 cm right. 5.  Origin occlusion of the left internal iliac artery. 6. Marked prostatic enlargement. 7. Scattered small sclerotic foci in the thoracolumbar spine, sacrum, left twelfth rib, pelvis, and right femur; early osseous metastatic disease versus multiple bone islands are primary considerations. Elective outpatient  whole-body bone scintigraphy and PSA may be useful for further evaluation. Critical Value/emergent results were called by telephone at the time of interpretation on 03/06/2018 at 10:39 am to Dr. Gerlene Fee , who verbally acknowledged these results. Electronically Signed   By: Lucrezia Europe M.D.   On: 03/06/2018 10:41    Cardiac Studies   CATH:    Intervention      Patient Profile     57 y.o. male without prior cardiac history who presents with chest pain and found to have NSTEMI with disease as above.  He was taken to the cath lab and has had standing of the LAD.  Markedly reduced EF.   Assessment & Plan    NSTEMI:  Ambulate, rehab.  Meds as below.   On ASA, Brilinta and Lipitor.   TOBACCO ABUSE:  Educated  HTN:  This will be managed in the context of treating the ischemic cardiomyopathy.    ISCHEMIC CARDIOMYOPATHY:  Check echocardiogram.   Start Coreg 6.25 mg bid.  BP is fluctuating so will hold off on ARB for today.     For questions or updates, please contact [place] Please consult [email or web address] for contact info under Cardiology/STEMI.   Signed, Minus Breeding, MD  03/07/2018, 9:18 AM

## 2018-03-07 NOTE — Progress Notes (Signed)
Echocardiogram Complete  Treonna Klee, RDCS 

## 2018-03-07 NOTE — Plan of Care (Signed)
  Problem: Activity: Goal: Ability to return to baseline activity level will improve Outcome: Progressing   Problem: Cardiovascular: Goal: Vascular access site(s) Level 0-1 will be maintained Outcome: Progressing   Problem: Clinical Measurements: Goal: Ability to maintain clinical measurements within normal limits will improve Outcome: Progressing   Problem: Activity: Goal: Risk for activity intolerance will decrease Outcome: Progressing   Problem: Nutrition: Goal: Adequate nutrition will be maintained Outcome: Progressing

## 2018-03-08 ENCOUNTER — Encounter (HOSPITAL_COMMUNITY): Payer: Self-pay | Admitting: Cardiology

## 2018-03-08 DIAGNOSIS — Z720 Tobacco use: Secondary | ICD-10-CM

## 2018-03-08 DIAGNOSIS — E876 Hypokalemia: Secondary | ICD-10-CM

## 2018-03-08 LAB — BASIC METABOLIC PANEL
Anion gap: 14 (ref 5–15)
BUN: 5 mg/dL — ABNORMAL LOW (ref 6–20)
CO2: 20 mmol/L — ABNORMAL LOW (ref 22–32)
Calcium: 8.9 mg/dL (ref 8.9–10.3)
Chloride: 101 mmol/L (ref 98–111)
Creatinine, Ser: 0.78 mg/dL (ref 0.61–1.24)
GFR calc Af Amer: >60 mL/min (ref >60)
GFR calc non Af Amer: >60 mL/min (ref >60)
Glucose, Bld: 116 mg/dL — ABNORMAL HIGH (ref 70–99)
Potassium: 3.8 mmol/L (ref 3.5–5.1)
Sodium: 135 mmol/L (ref 135–145)

## 2018-03-08 LAB — GLUCOSE, CAPILLARY: Glucose-Capillary: 99 mg/dL (ref 70–99)

## 2018-03-08 LAB — TROPONIN I: Troponin I: 18.33 ng/mL (ref <0.03)

## 2018-03-08 LAB — POCT ACTIVATED CLOTTING TIME
Activated Clotting Time: 263 seconds
Activated Clotting Time: 340 seconds

## 2018-03-08 MED ORDER — HEPARIN SODIUM (PORCINE) 5000 UNIT/ML IJ SOLN
5000.0000 [IU] | Freq: Three times a day (TID) | INTRAMUSCULAR | Status: DC
  Administered 2018-03-08 – 2018-03-10 (×6): 5000 [IU] via SUBCUTANEOUS
  Filled 2018-03-08 (×6): qty 1

## 2018-03-08 MED ORDER — NITROGLYCERIN IN D5W 200-5 MCG/ML-% IV SOLN
0.0000 ug/min | INTRAVENOUS | Status: DC
  Administered 2018-03-08: 20 ug/min via INTRAVENOUS
  Filled 2018-03-08 (×2): qty 250

## 2018-03-08 MED ORDER — CARVEDILOL 6.25 MG PO TABS
6.2500 mg | ORAL_TABLET | Freq: Once | ORAL | Status: AC
  Administered 2018-03-08: 6.25 mg via ORAL
  Filled 2018-03-08: qty 1

## 2018-03-08 MED ORDER — LISINOPRIL 5 MG PO TABS
5.0000 mg | ORAL_TABLET | Freq: Every day | ORAL | Status: DC
  Administered 2018-03-08 – 2018-03-10 (×3): 5 mg via ORAL
  Filled 2018-03-08 (×3): qty 1

## 2018-03-08 MED ORDER — CARVEDILOL 12.5 MG PO TABS
12.5000 mg | ORAL_TABLET | Freq: Two times a day (BID) | ORAL | Status: DC
  Administered 2018-03-08 – 2018-03-10 (×4): 12.5 mg via ORAL
  Filled 2018-03-08 (×4): qty 1

## 2018-03-08 MED FILL — Nitroglycerin IV Soln 200 MCG/ML in D5W: INTRAVENOUS | Qty: 250 | Status: AC

## 2018-03-08 NOTE — Progress Notes (Signed)
Patient called out with c/o chest pain.  BP 160/109 HR 110 pt noted to be diaphoretic and clammy to touch.  Dr. Claiborne Billings notified and orders received for nitroglycerin gtt, EKG and enzymes.  Will continue to monitor.

## 2018-03-08 NOTE — Progress Notes (Addendum)
Progress Note  Patient Name: Daniel Weiss Date of Encounter: 03/08/2018  Primary Cardiologist: Dr. Ellyn Hack  Subjective   Recurrent chest pain and hypertension this am  Inpatient Medications    Scheduled Meds: . aspirin EC  81 mg Oral Daily  . atorvastatin  80 mg Oral q1800  . carvedilol  6.25 mg Oral BID WC  . sodium chloride flush  3 mL Intravenous Q12H  . ticagrelor  90 mg Oral BID   Continuous Infusions: . sodium chloride 250 mL (03/08/18 0905)  . nitroGLYCERIN 20 mcg/min (03/08/18 0901)   PRN Meds: sodium chloride, acetaminophen, morphine injection, sodium chloride flush   Vital Signs    Vitals:   03/08/18 0500 03/08/18 0600 03/08/18 0700 03/08/18 0838  BP: 113/82 (!) 114/92 (!) 134/99   Pulse: 91 92 94   Resp: 14 20 (!) 23   Temp:    98.4 F (36.9 C)  TempSrc:    Oral  SpO2: 98% 98% 98%   Weight:      Height:        Intake/Output Summary (Last 24 hours) at 03/08/2018 0918 Last data filed at 03/08/2018 0200 Gross per 24 hour  Intake 360 ml  Output 1125 ml  Net -765 ml    I/O since admission:   Filed Weights   03/06/18 0928  Weight: 66.7 kg    Telemetry    Sinus Tachycardic - Personally Reviewed  ECG    ECG (independently read by me):  Physical Exam    BP (!) 134/99 (BP Location: Left Arm)   Pulse 94   Temp 98.4 F (36.9 C) (Oral)   Resp (!) 23   Ht 6' (1.829 m)   Wt 66.7 kg   SpO2 98%   BMI 19.94 kg/m  General: Alert, oriented, no distress.  Skin: normal turgor, no rashes, warm and dry HEENT: Normocephalic, atraumatic. Pupils equal round and reactive to light; sclera anicteric; extraocular muscles intact;  Nose without nasal septal hypertrophy Mouth/Parynx benign; Mallinpatti scale 3 Neck: R neck mass; No JVD, ; normal carotid upstroke Lungs: clear to ausculatation and percussion; no wheezing or rales Chest wall: without tenderness to palpitation Heart: PMI not displaced, RRR, s1 s2 normal, 1/6 systolic murmur, no diastolic  murmur, no rubs, gallops, thrills, or heaves Abdomen: soft, nontender; no hepatosplenomehaly, BS+; abdominal aorta nontender and not dilated by palpation. Back: no CVA tenderness Pulses 2+ Musculoskeletal: full range of motion, normal strength, no joint deformities Extremities: no clubbing cyanosis or edema, Homan's sign negative  Neurologic: grossly nonfocal; Cranial nerves grossly wnl Psychologic: Normal mood and affect   Labs    Chemistry Recent Labs  Lab 03/06/18 0941 03/07/18 0812  NA 132* 136  K 3.8 3.4*  CL 94* 100  CO2 24 25  GLUCOSE 121* 135*  BUN 16 <5*  CREATININE 1.37* 0.90  CALCIUM 9.3 8.7*  PROT 8.5* 6.5  ALBUMIN 3.7 2.5*  AST 106* 62*  ALT 38 24  ALKPHOS 73 57  BILITOT 0.9 0.9  GFRNONAA 56* >60  GFRAA >60 >60  ANIONGAP 14 11     Hematology Recent Labs  Lab 03/06/18 0941 03/07/18 0812  WBC 14.1* 9.0  RBC 6.16* 5.23  HGB 15.7 13.6  HCT 49.5 41.5  MCV 80.4 79.3*  MCH 25.5* 26.0  MCHC 31.7 32.8  RDW 20.5* 19.1*  PLT 286 268    Cardiac Enzymes Recent Labs  Lab 03/06/18 1517 03/06/18 1904 03/07/18 0108 03/07/18 0812  TROPONINI 30.65* 28.85* 25.80* 23.94*  No results for input(s): TROPIPOC in the last 168 hours.   BNPNo results for input(s): BNP, PROBNP in the last 168 hours.   DDimer No results for input(s): DDIMER in the last 168 hours.   Lipid Panel     Component Value Date/Time   CHOL 147 03/07/2018 0812   TRIG 107 03/07/2018 0812   HDL 38 (L) 03/07/2018 0812   CHOLHDL 3.9 03/07/2018 0812   VLDL 21 03/07/2018 0812   LDLCALC 88 03/07/2018 0812     Radiology    Dg Chest Portable 1 View  Result Date: 03/06/2018 CLINICAL DATA:  Pt reports sharp intermittent left sided CP radiating into his left arm x 1 week. Denies N/V SOB. Reports some dizziness with the episodes EXAM: PORTABLE CHEST - 1 VIEW COMPARISON:  06/29/2015 FINDINGS: Lungs are clear. Heart size and mediastinal contours are within normal limits. No effusion.  No  pneumothorax. Visualized bones unremarkable. IMPRESSION: No acute cardiopulmonary disease. Electronically Signed   By: Lucrezia Europe M.D.   On: 03/06/2018 10:42   Ct Angio Chest/abd/pel For Dissection W &/or Wo Contrast  Result Date: 03/06/2018 CLINICAL DATA:  Dizziness; chest pain x 6 hours; no back pain; abdomen pain earlier today; n/v yesterday, none today; SOB; lightheadedness; no heart issues; EXAM: CT ANGIOGRAPHY CHEST, ABDOMEN AND PELVIS TECHNIQUE: Multidetector CT imaging through the chest, abdomen and pelvis was performed using the standard protocol during bolus administration of intravenous contrast. Multiplanar reconstructed images and MIPs were obtained and reviewed to evaluate the vascular anatomy. CONTRAST:  136mL ISOVUE-370 IOPAMIDOL (ISOVUE-370) INJECTION 76% COMPARISON:  None. FINDINGS: CTA CHEST FINDINGS Cardiovascular: Heart size normal. Trace pericardial effusion. The RV is nondilated. Good contrast opacification of pulmonary artery branches with no convincing filling defects to suggest acute PE. Breathing motion during the acquisition degrades some of the images. Adequate contrast opacification of the thoracic aorta with no evidence of dissection, aneurysm, or stenosis. Ectatic proximal arch 3 cm diameter. There is bovine variant brachiocephalic arch anatomy without proximal stenosis. Mediastinum/Nodes: No hilar or mediastinal adenopathy. Lungs/Pleura: No pleural effusion. No pneumothorax. Lungs are clear. Musculoskeletal: Mild DJD in bilateral shoulders. Negative for fracture. Nonspecific subcentimeter sclerotic foci in several thoracic vertebral bodies and left twelfth rib. Review of the MIP images confirms the above findings. CTA ABDOMEN AND PELVIS FINDINGS VASCULAR Aorta: Eccentric nonocclusive mural thrombus throughout the ectatic abdominal aorta. No dissection, stenosis, or aneurysm. Scattered calcified atheromatous plaque. Celiac: Patent without evidence of aneurysm, dissection,  vasculitis or significant stenosis. SMA: Patent without evidence of aneurysm, dissection, vasculitis or significant stenosis. Renals: Both renal arteries are patent without evidence of aneurysm, dissection, vasculitis, fibromuscular dysplasia or significant stenosis. IMA: Origin stenosis related to aortic wall plaque, patent distally. Inflow: Scattered calcified atheromatous plaque through bilateral common iliac arteries without dissection or stenosis. The right common iliac artery is ectatic up to 1.3 cm, left 1.4 cm. 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 Mild atheromatous change in bilateral external iliac arteries without stenosis. Visualized proximal outflow patent. Veins: No obvious venous abnormality within the limitations of this arterial phase study. Review of the MIP images confirms the above findings. NON-VASCULAR Hepatobiliary: Several low-attenuation well-defined hepatic lesions largest 1.2 cm in segment 6, presumably cysts in the absence of a history of primary carcinoma. Gallbladder unremarkable. No biliary ductal dilatation. Pancreas: Unremarkable. No pancreatic ductal dilatation or surrounding inflammatory changes. Spleen: Small, unremarkable, without focal lesion. Adrenals/Urinary Tract: Adrenal glands are unremarkable. Kidneys are normal, without renal calculi,  focal lesion, or hydronephrosis. Bladder is unremarkable. Stomach/Bowel: Stomach is incompletely distended. Small bowel is nondilated. Appendix is not discretely identified. No pericecal inflammatory/edematous change. The colon is nondilated, unremarkable. Lymphatic: Subcentimeter left para-aortic and aortocaval lymph nodes. No pelvic or mesenteric adenopathy. Reproductive: Marked prostatic enlargement protruding into the urinary bladder. Other: Left pelvic phleboliths. No ascites. No free air. Musculoskeletal: Scattered small sclerotic foci in lumbar spine, sacrum, pelvis, right  femoral neck. No fracture or acute abnormality. Review of the MIP images confirms the above findings. IMPRESSION: 1. Negative for acute PE or thoracic aortic dissection. 2. Ectatic 3 cm aortic arch. Recommend annual imaging followup by CTA or MRA. This recommendation follows 2010 ACCF/AHA/AATS/ACR/ASA/SCA/SCAI/SIR/STS/SVM Guidelines for the Diagnosis and Management of Patients with Thoracic Aortic Disease. Circulation.2010; 121: D622-W979 3. Ectatic atheromatous abdominal aorta at risk for aneurysm development. Recommend followup by ultrasound in 5 years. This recommendation follows ACR consensus guidelines: White Paper of the ACR Incidental Findings Committee II on Vascular Findings. J Am Coll Radiol 2013; 10:789-794. 4. Ectatic bilateral common iliac arteries, 1.4 cm left and 1.3 cm right. 5. Origin occlusion of the left internal iliac artery. 6. Marked prostatic enlargement. 7. Scattered small sclerotic foci in the thoracolumbar spine, sacrum, left twelfth rib, pelvis, and right femur; early osseous metastatic disease versus multiple bone islands are primary considerations. Elective outpatient whole-body bone scintigraphy and PSA may be useful for further evaluation. Critical Value/emergent results were called by telephone at the time of interpretation on 03/06/2018 at 10:39 am to Dr. Gerlene Fee , who verbally acknowledged these results. Electronically Signed   By: Lucrezia Europe M.D.   On: 03/06/2018 10:41    Cardiac Studies    There is moderate to severe left ventricular systolic dysfunction.  LV end diastolic pressure is mildly elevated.  The left ventricular ejection fraction is 25-35% by visual estimate.  There is trivial (1+) mitral regurgitation.  Prox LAD lesion is 85% stenosed with 45% stenosed side branch in Ost 1st Diag.  Post intervention, there is a 0% residual stenosis.  Post intervention, the side branch was reduced to 45% residual stenosis.  A drug-eluting stent was successfully  placed using a STENT SIERRA 3.50 X 15 MM.  Ost Ramus lesion is 55% stenosed.   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  Moderate to severely reduced LVEF with mid anterior and inferior severe hypokinesis and apical inferior and anterior akinesis -consistent with a recent anterior MI.  Despite reduced EF, minimally elevated LV EDP.  RECOMMENDATIONS  Patient will be admitted to ICU for post PCI care.  Continue on Brevibloc drip and wean off as we start on oral beta-blocker (carvedilol 3.125 mg twice daily)  Hold home dose of amlodipine until we see his blood pressure, would likely consider ARB for afterload reduction.  Check 2D echocardiogram to better assess EF tomorrow -if less than 30% would consider LifeVest prior to discharge.  High-dose statin (increased home dose of 40 mg daily milligrams)    Recommendations   Antiplatelet/Anticoag Recommend uninterrupted dual antiplatelet therapy with Aspirin 81mg  daily and Ticagrelor 90mg  twice daily for a minimum of 12 months (ACS-Class I recommendation). Would be okay to DC aspirin after 3 months if necessary for bleeding or bruising. Would reduce ticagrelor dose to 60 mg maintenance dose after 1 year continue  but possibly interrupted for another year given LAD disease.     Diagnostic  Dominance: Right    Intervention        ------------------------------------------------------------------- 03/06/2018 ECHO Study Conclusions  - Left ventricle: There was moderate concentric hypertrophy.   Systolic function was moderately reduced. The estimated ejection   fraction was in the range of 35% to 40%. Moderate diffuse   hypokinesis with distinct regional wall motion abnormalities.   Severe hypokinesis of the mid-apicalanteroseptal,  anterior, and   apical myocardium; consistent with ischemia or infarction in the   distribution of the left anterior descending coronary artery.   There was fusion of early and atrial contributions to ventricular   filling. The study is not technically sufficient to allow   evaluation of LV diastolic function. - Ventricular septum: Septal motion showed paradox. These changes   are consistent with a left bundle branch block. - Aortic valve: Valve area (VTI): 3.14 cm^2. Valve area (Vmax):   3.44 cm^2. Valve area (Vmean): 3.77 cm^2. - Right ventricle: Hypertrophy was present. - Pericardium, extracardiac: A trivial pericardial effusion was   identified.   Patient Profile     57 y.o. male admitted 03/06/2017 with a week h/o chest pain, transferred from [facility] and underwent emergent cath/PCI to LAD.  Assessment & Plan    1. NSTEMI with post infarct angina Peak 30.65 > 23.94.   S/P PCI to LAD with persistent STE also contributed by ILBBB conduction abnormality.  Pt developed recurrent chest pain this am associated with diaphoresis, but different from prior discomfort; BP elevated to 160/110;  Started on Iv NTG and titrated to current dose of 50 ug;  CP now improved and resolved;  ECG with persistent STE as noted post intervention and ILBBB pattern.  Tachycardic at 115 . Will give an additional dose of carvedilol and plan to titrate to 12.5 mg bid.   2. EF 35 - 40% c/w ischemic cardiomyopathy with LAD distribution wall motion abnormality  3. HTN: Now on iv NTG and will titrate carvedilol.  Will add lisinopril 5 mg daily.  4. HypoK K 3.4;  Recheck this am   5. Initiate DVT with heparin sq.   6. Tobacco; discussed cessation   7. HLD: LDL 88 now on atorvastatin 80 mg.   8. R neck mass; present for 10 yrs.  Time spent: 40 minutes.   Signed, Troy Sine, MD, [facility] 03/08/2018, 9:18 AM

## 2018-03-08 NOTE — Plan of Care (Signed)
  Problem: Education: Goal: Understanding of CV disease, CV risk reduction, and recovery process will improve Outcome: Progressing   Problem: Activity: Goal: Ability to return to baseline activity level will improve Outcome: Progressing   Problem: Cardiovascular: Goal: Ability to achieve and maintain adequate cardiovascular perfusion will improve Outcome: Progressing Goal: Vascular access site(s) Level 0-1 will be maintained Outcome: Progressing   Problem: Education: Goal: Knowledge of General Education information will improve Description Including pain rating scale, medication(s)/side effects and non-pharmacologic comfort measures Outcome: Progressing   Problem: Health Behavior/Discharge Planning: Goal: Ability to manage health-related needs will improve Outcome: Progressing   Problem: Clinical Measurements: Goal: Ability to maintain clinical measurements within normal limits will improve Outcome: Progressing Goal: Will remain free from infection Outcome: Progressing Goal: Diagnostic test results will improve Outcome: Progressing Goal: Respiratory complications will improve Outcome: Progressing Goal: Cardiovascular complication will be avoided Outcome: Progressing   Problem: Activity: Goal: Risk for activity intolerance will decrease Outcome: Progressing   Problem: Nutrition: Goal: Adequate nutrition will be maintained Outcome: Progressing   Problem: Coping: Goal: Level of anxiety will decrease Outcome: Progressing   Problem: Elimination: Goal: Will not experience complications related to urinary retention Outcome: Progressing

## 2018-03-08 NOTE — Care Management (Deleted)
Brilinta benefits check sent and pending. CM team will follow to discuss est monthly copay amount and provide the brilinta card once cost has been determined.   Maud Deed. Fleurette Woolbright BSN, NCM-BC, ACM-RN

## 2018-03-09 ENCOUNTER — Encounter (HOSPITAL_COMMUNITY): Payer: Self-pay

## 2018-03-09 NOTE — Progress Notes (Signed)
CARDIAC REHAB PHASE I   PRE:  Rate/Rhythm: 75 SR    BP: sitting 118/87    SaO2: 100 RA  MODE:  Ambulation: 540 ft   POST:  Rate/Rhythm: 96 SR    BP: sitting 148/96     SaO2:   Tolerated well, no c/o. Feels well today. This was second walk. BP elevated somewhat. Ed completed with good reception. Discussed Brilinta (needs assistance), MI, HF, stent, restrictions, smoking cessation, diet, ex, NTG and CRPII. Will refer to [facility]. Pt wants to quit smoking and is planning on staying with his family initially so thinks he will have better success there. Encouraged walking more. [phone]   [name] CES, ACSM 03/09/2018 11:11 AM

## 2018-03-09 NOTE — Plan of Care (Signed)
  Problem: Health Behavior/Discharge Planning: Goal: Ability to safely manage health-related needs after discharge will improve Outcome: Progressing   

## 2018-03-09 NOTE — Progress Notes (Signed)
Progress Note  Patient Name: Daniel Weiss Date of Encounter: 03/09/2018  Primary Cardiologist: Dr. Ellyn Hack  Subjective   No recurrent chest pain  Inpatient Medications    Scheduled Meds: . aspirin EC  81 mg Oral Daily  . atorvastatin  80 mg Oral q1800  . carvedilol  12.5 mg Oral BID WC  . heparin injection (subcutaneous)  5,000 Units Subcutaneous Q8H  . lisinopril  5 mg Oral Daily  . sodium chloride flush  3 mL Intravenous Q12H  . ticagrelor  90 mg Oral BID   Continuous Infusions: . sodium chloride Stopped (03/09/18 0952)  . nitroGLYCERIN Stopped (03/08/18 2249)   PRN Meds: sodium chloride, acetaminophen, morphine injection, sodium chloride flush   Vital Signs    Vitals:   03/09/18 0830 03/09/18 0900 03/09/18 0930 03/09/18 1000  BP: 132/90 126/90 (!) 127/93 118/87  Pulse: 87 90 80 70  Resp: (!) 23 (!) 24 (!) 22 (!) 23  Temp:      TempSrc:      SpO2: 100% 100% 100% 100%  Weight:      Height:        Intake/Output Summary (Last 24 hours) at 03/09/2018 1050 Last data filed at 03/09/2018 1000 Gross per 24 hour  Intake 589.04 ml  Output 550 ml  Net 39.04 ml    I/O since admission: -[place]   03/06/18 0928 03/09/18 0600  Weight: 66.7 kg 60.9 kg    Telemetry    Sinus  In th 70s- Personally Reviewed  ECG    ECG (independently read by me): NSR 79; IVCD; Q I,aVL, V6; T wave inversion inferiorly.  Resolved previous persistent STE.  Physical Exam    BP 118/87   Pulse 70   Temp 98.5 F (36.9 C) (Oral)   Resp (!) 23   Ht 6' (1.829 m)   Wt 60.9 kg   SpO2 100%   BMI 18.21 kg/m  General: Alert, oriented, no distress.  Skin: normal turgor, no rashes, warm and dry HEENT: Normocephalic, atraumatic. Pupils equal round and reactive to light; sclera anicteric; extraocular muscles intact;  Nose without nasal septal hypertrophy Mouth/Parynx benign; Mallinpatti scale 3 Neck: No JVD, no carotid bruits; normal carotid upstroke Lungs: clear to  ausculatation and percussion; no wheezing or rales Chest wall: without tenderness to palpitation Heart: PMI not displaced, RRR, s1 s2 normal, 1/6 systolic murmur, no diastolic murmur, no rubs, gallops, thrills, or heaves Abdomen: soft, nontender; no hepatosplenomehaly, BS+; abdominal aorta nontender and not dilated by palpation. Back: no CVA tenderness Pulses 2+ Musculoskeletal: full range of motion, normal strength, no joint deformities Extremities: no clubbing cyanosis or edema, Homan's sign negative  Neurologic: grossly nonfocal; Cranial nerves grossly wnl Psychologic: Normal mood and affect   Labs    Chemistry Recent Labs  Lab 03/06/18 0941 03/07/18 0812 03/08/18 0937  NA 132* 136 135  K 3.8 3.4* 3.8  CL 94* 100 101  CO2 24 25 20*  GLUCOSE 121* 135* 116*  BUN 16 <5* 5*  CREATININE 1.37* 0.90 0.78  CALCIUM 9.3 8.7* 8.9  PROT 8.5* 6.5  --   ALBUMIN 3.7 2.5*  --   AST 106* 62*  --   ALT 38 24  --   ALKPHOS 73 57  --   BILITOT 0.9 0.9  --   GFRNONAA 56* >60 >60  GFRAA >60 >60 >60  ANIONGAP 14 11 14      Hematology Recent Labs  Lab 03/06/18 0941 03/07/18 0812  WBC  14.1* 9.0  RBC 6.16* 5.23  HGB 15.7 13.6  HCT 49.5 41.5  MCV 80.4 79.3*  MCH 25.5* 26.0  MCHC 31.7 32.8  RDW 20.5* 19.1*  PLT 286 268    Cardiac Enzymes Recent Labs  Lab 03/06/18 1904 03/07/18 0108 03/07/18 0812 03/08/18 0921  TROPONINI 28.85* 25.80* 23.94* 18.33*   No results for input(s): TROPIPOC in the last 168 hours.   BNPNo results for input(s): BNP, PROBNP in the last 168 hours.   DDimer No results for input(s): DDIMER in the last 168 hours.   Lipid Panel     Component Value Date/Time   CHOL 147 03/07/2018 0812   TRIG 107 03/07/2018 0812   HDL 38 (L) 03/07/2018 0812   CHOLHDL 3.9 03/07/2018 0812   VLDL 21 03/07/2018 0812   LDLCALC 88 03/07/2018 0812     Radiology    No results found.  Cardiac Studies    There is moderate to severe left ventricular systolic  dysfunction.  LV end diastolic pressure is mildly elevated.  The left ventricular ejection fraction is 25-35% by visual estimate.  There is trivial (1+) mitral regurgitation.  Prox LAD lesion is 85% stenosed with 45% stenosed side branch in Ost 1st Diag.  Post intervention, there is a 0% residual stenosis.  Post intervention, the side branch was reduced to 45% residual stenosis.  A drug-eluting stent was successfully placed using a STENT SIERRA 3.50 X 15 MM.  Ost Ramus lesion is 55% stenosed.   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  Moderate to severely reduced LVEF with mid anterior and inferior severe hypokinesis and apical inferior and anterior akinesis -consistent with a recent anterior MI.  Despite reduced EF, minimally elevated LV EDP.  RECOMMENDATIONS  Patient will be admitted to ICU for post PCI care.  Continue on Brevibloc drip and wean off as we start on oral beta-blocker (carvedilol 3.125 mg twice daily)  Hold home dose of amlodipine until we see his blood pressure, would likely consider ARB for afterload reduction.  Check 2D echocardiogram to better assess EF tomorrow -if less than 30% would consider LifeVest prior to discharge.  High-dose statin (increased home dose of 40 mg daily milligrams)    Recommendations   Antiplatelet/Anticoag Recommend uninterrupted dual antiplatelet therapy with Aspirin 81mg  daily and Ticagrelor 90mg  twice daily for a minimum of 12 months (ACS-Class I recommendation). Would be okay to DC aspirin after 3 months if necessary for bleeding or bruising. Would reduce ticagrelor dose to 60 mg maintenance dose after 1 year continue but possibly interrupted for another year given LAD disease.     Diagnostic  Dominance: Right      Intervention        ------------------------------------------------------------------- 03/06/2018 ECHO Study Conclusions  - Left ventricle: There was moderate concentric hypertrophy.   Systolic function was moderately reduced. The estimated ejection   fraction was in the range of 35% to 40%. Moderate diffuse   hypokinesis with distinct regional wall motion abnormalities.   Severe hypokinesis of the mid-apicalanteroseptal, anterior, and   apical myocardium; consistent with ischemia or infarction in the   distribution of the left anterior descending coronary artery.   There was fusion of early and atrial contributions to ventricular   filling. The study is not technically sufficient to allow  evaluation of LV diastolic function. - Ventricular septum: Septal motion showed paradox. These changes   are consistent with a left bundle branch block. - Aortic valve: Valve area (VTI): 3.14 cm^2. Valve area (Vmax):   3.44 cm^2. Valve area (Vmean): 3.77 cm^2. - Right ventricle: Hypertrophy was present. - Pericardium, extracardiac: A trivial pericardial effusion was   identified.   Patient Profile     57 y.o. male admitted 03/06/2017 with a week h/o chest pain, transferred from [facility] and underwent emergent cath/PCI to LAD.  Assessment & Plan    1. NSTEMI with post infarct angina Peak 30.65 > 23.94.   S/P PCI to LAD with persistent STE also contributed by ILBBB conduction abnormality.  Pt developed recurrent chest pain 12/30 am associated with diaphoresis, but different from prior discomfort; BP elevated to 160/110;  Started on Iv NTG and titrated to dose of 50 ug;  CP  improved and resolved;  ECG with persistent STE as noted post intervention and ILBBB pattern.  Tachycardic at 115 . Carvedilol was increased. Will titrate today to 12.5 mg bid. CP completely resolved today; now off iv NTG.   ECG today significantly improved with HR 79 and resolution of previous persistent  STE.  2. EF 35 - 40% c/w ischemic cardiomyopathy with LAD distribution wall motion abnormality  3. HTN: Now off iv NTG and will titrate carvedilol. lisinopril 5 mg daily added 12/20.  4. HypoK K today 3.8  5. Initiate DVT with heparin sq.   6. Tobacco; discussed cessation   7. HLD: LDL 88 now on atorvastatin 80 mg.   8. R neck mass; present for 10 yrs.  9. CT: Marked prostatic enlargement. Scattered small sclerotic foci in the thoracolumbar spine, sacrum, left twelfth rib, pelvis, and right femur; early osseous metastatic disease versus multiple bone islands are primary considerations.  Will check PSA today.   Elective outpatient whole-body bone scintigraphy and useful for further evaluation.  Ambulate; cardiac rehab; Will transfer to step down.    Signed, Troy Sine, MD, [facility] 03/09/2018, 10:50 AM

## 2018-03-10 ENCOUNTER — Encounter (HOSPITAL_COMMUNITY): Payer: Self-pay | Admitting: Nurse Practitioner

## 2018-03-10 DIAGNOSIS — I255 Ischemic cardiomyopathy: Secondary | ICD-10-CM

## 2018-03-10 DIAGNOSIS — R9389 Abnormal findings on diagnostic imaging of other specified body structures: Secondary | ICD-10-CM

## 2018-03-10 DIAGNOSIS — C61 Malignant neoplasm of prostate: Secondary | ICD-10-CM

## 2018-03-10 DIAGNOSIS — I25119 Atherosclerotic heart disease of native coronary artery with unspecified angina pectoris: Secondary | ICD-10-CM

## 2018-03-10 DIAGNOSIS — N400 Enlarged prostate without lower urinary tract symptoms: Secondary | ICD-10-CM

## 2018-03-10 DIAGNOSIS — E785 Hyperlipidemia, unspecified: Secondary | ICD-10-CM

## 2018-03-10 HISTORY — DX: Abnormal findings on diagnostic imaging of other specified body structures: R93.89

## 2018-03-10 HISTORY — DX: Atherosclerotic heart disease of native coronary artery with unspecified angina pectoris: I25.119

## 2018-03-10 HISTORY — DX: Malignant neoplasm of prostate: C61

## 2018-03-10 LAB — BASIC METABOLIC PANEL
Anion gap: 8 (ref 5–15)
BUN: 7 mg/dL (ref 6–20)
CO2: 25 mmol/L (ref 22–32)
Calcium: 8.8 mg/dL — ABNORMAL LOW (ref 8.9–10.3)
Chloride: 101 mmol/L (ref 98–111)
Creatinine, Ser: 0.75 mg/dL (ref 0.61–1.24)
GFR calc Af Amer: >60 mL/min (ref >60)
GFR calc non Af Amer: >60 mL/min (ref >60)
GLUCOSE: 109 mg/dL — AB (ref 70–99)
Potassium: 3.7 mmol/L (ref 3.5–5.1)
Sodium: 134 mmol/L — ABNORMAL LOW (ref 135–145)

## 2018-03-10 MED ORDER — TICAGRELOR 90 MG PO TABS: 90.0000 mg | ORAL_TABLET | Freq: Two times a day (BID) | ORAL | 0 refills | Status: DC

## 2018-03-10 MED ORDER — NITROGLYCERIN 0.4 MG SL SUBL: 0.4000 mg | SUBLINGUAL_TABLET | SUBLINGUAL | 3 refills | Status: DC | PRN

## 2018-03-10 MED ORDER — LISINOPRIL 5 MG PO TABS: 5.0000 mg | ORAL_TABLET | Freq: Every day | ORAL | 6 refills | Status: DC

## 2018-03-10 MED ORDER — ATORVASTATIN CALCIUM 80 MG PO TABS: 80.0000 mg | ORAL_TABLET | Freq: Every day | ORAL | 6 refills | Status: DC

## 2018-03-10 MED ORDER — CARVEDILOL 12.5 MG PO TABS: 12.5000 mg | ORAL_TABLET | Freq: Two times a day (BID) | ORAL | 6 refills | Status: DC

## 2018-03-10 MED ORDER — TICAGRELOR 90 MG PO TABS: 90.0000 mg | ORAL_TABLET | Freq: Two times a day (BID) | ORAL | 6 refills | Status: DC

## 2018-03-10 NOTE — Progress Notes (Signed)
Progress Note  Patient Name: Daniel Weiss Date of Encounter: 03/10/2018  Primary Cardiologist: Dr. Ellyn Hack  Subjective   No recurrent chest pain; Feels great anxious to go today  Inpatient Medications    Scheduled Meds: . aspirin EC  81 mg Oral Daily  . atorvastatin  80 mg Oral q1800  . carvedilol  12.5 mg Oral BID WC  . heparin injection (subcutaneous)  5,000 Units Subcutaneous Q8H  . lisinopril  5 mg Oral Daily  . sodium chloride flush  3 mL Intravenous Q12H  . ticagrelor  90 mg Oral BID   Continuous Infusions: . sodium chloride Stopped (03/09/18 0952)  . nitroGLYCERIN Stopped (03/08/18 2249)   PRN Meds: sodium chloride, acetaminophen, morphine injection, sodium chloride flush   Vital Signs    Vitals:   03/10/18 0700 03/10/18 0746 03/10/18 0800 03/10/18 1000  BP: 118/84  (!) 137/96 117/87  Pulse: 80  79 74  Resp: 15  19 (!) 22  Temp:  98.8 F (37.1 C)    TempSrc:  Oral    SpO2: 100%  100% 100%  Weight:      Height:        Intake/Output Summary (Last 24 hours) at 03/10/2018 1028 Last data filed at 03/10/2018 0900 Gross per 24 hour  Intake 480 ml  Output 1030 ml  Net -550 ml    I/O since admission: -910  Filed Weights   03/06/18 0928 03/09/18 0600  Weight: 66.7 kg 60.9 kg    Telemetry    Sinus  In th 70s- Personally Reviewed  ECG    ECG (independently read by me): NSR 79; IVCD; Q I,aVL, V6; T wave inversion inferiorly.  Resolved previous persistent STE.  Physical Exam    BP 117/87   Pulse 74   Temp 98.8 F (37.1 C) (Oral)   Resp (!) 22   Ht 6' (1.829 m)   Wt 60.9 kg   SpO2 100%   BMI 18.21 kg/m  General: feles great Skin: normal turgor, no rashes, warm and dry HEENT: Normocephalic, atraumatic. Pupils equal round and reactive to light; sclera anicteric; extraocular muscles intact;  Nose without nasal septal hypertrophy Mouth/Parynx benign; Mallinpatti scale 3 Neck: R neck mass Lungs: clear, no wheezing Chest wall: without tenderness  to palpitation Heart: PMI not displaced, Rate now in the 70s, regular without ectopy, s1 s2 normal, 1/6 systolic murmur, no diastolic murmur, no rubs, gallops, thrills, or heaves Abdomen: soft, nontender; no hepatosplenomehaly, BS+; abdominal aorta nontender and not dilated by palpation. Back: no CVA tenderness Pulses 2+ Musculoskeletal:  No joint tenderness Extremities: no clubbing cyanosis or edema, Homan's sign negative  Neurologic: nonfocal Psychologic: Normal mood and affect   Labs    Chemistry Recent Labs  Lab 03/06/18 0941 03/07/18 0812 03/08/18 0937 03/10/18 0417  NA 132* 136 135 134*  K 3.8 3.4* 3.8 3.7  CL 94* 100 101 101  CO2 24 25 20* 25  GLUCOSE 121* 135* 116* 109*  BUN 16 <5* 5* 7  CREATININE 1.37* 0.90 0.78 0.75  CALCIUM 9.3 8.7* 8.9 8.8*  PROT 8.5* 6.5  --   --   ALBUMIN 3.7 2.5*  --   --   AST 106* 62*  --   --   ALT 38 24  --   --   ALKPHOS 73 57  --   --   BILITOT 0.9 0.9  --   --   GFRNONAA 56* >60 >60 >60  GFRAA >60 >60 >60 >60  ANIONGAP 14 11 14 8      Hematology Recent Labs  Lab 03/06/18 0941 03/07/18 0812  WBC 14.1* 9.0  RBC 6.16* 5.23  HGB 15.7 13.6  HCT 49.5 41.5  MCV 80.4 79.3*  MCH 25.5* 26.0  MCHC 31.7 32.8  RDW 20.5* 19.1*  PLT 286 268    Cardiac Enzymes Recent Labs  Lab 03/06/18 1904 03/07/18 0108 03/07/18 0812 03/08/18 0921  TROPONINI 28.85* 25.80* 23.94* 18.33*   No results for input(s): TROPIPOC in the last 168 hours.   BNPNo results for input(s): BNP, PROBNP in the last 168 hours.   DDimer No results for input(s): DDIMER in the last 168 hours.   Lipid Panel     Component Value Date/Time   CHOL 147 03/07/2018 0812   TRIG 107 03/07/2018 0812   HDL 38 (L) 03/07/2018 0812   CHOLHDL 3.9 03/07/2018 0812   VLDL 21 03/07/2018 0812   LDLCALC 88 03/07/2018 0812     Radiology    No results found.  Cardiac Studies    There is moderate to severe left ventricular systolic dysfunction.  LV end diastolic  pressure is mildly elevated.  The left ventricular ejection fraction is 25-35% by visual estimate.  There is trivial (1+) mitral regurgitation.  Prox LAD lesion is 85% stenosed with 45% stenosed side branch in Ost 1st Diag.  Post intervention, there is a 0% residual stenosis.  Post intervention, the side branch was reduced to 45% residual stenosis.  A drug-eluting stent was successfully placed using a STENT SIERRA 3.50 X 15 MM.  Ost Ramus lesion is 55% stenosed.   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  Moderate to severely reduced LVEF with mid anterior and inferior severe hypokinesis and apical inferior and anterior akinesis -consistent with a recent anterior MI.  Despite reduced EF, minimally elevated LV EDP.  RECOMMENDATIONS  Patient will be admitted to ICU for post PCI care.  Continue on Brevibloc drip and wean off as we start on oral beta-blocker (carvedilol 3.125 mg twice daily)  Hold home dose of amlodipine until we see his blood pressure, would likely consider ARB for afterload reduction.  Check 2D echocardiogram to better assess EF tomorrow -if less than 30% would consider LifeVest prior to discharge.  High-dose statin (increased home dose of 40 mg daily milligrams)    Recommendations   Antiplatelet/Anticoag Recommend uninterrupted dual antiplatelet therapy with Aspirin 81mg  daily and Ticagrelor 90mg  twice daily for a minimum of 12 months (ACS-Class I recommendation). Would be okay to DC aspirin after 3 months if necessary for bleeding or bruising. Would reduce ticagrelor dose to 60 mg maintenance dose after 1 year continue but possibly interrupted for another year given LAD disease.     Diagnostic  Dominance: Right    Intervention         ------------------------------------------------------------------- 03/06/2018 ECHO Study Conclusions  - Left ventricle: There was moderate concentric hypertrophy.   Systolic function was moderately reduced. The estimated ejection   fraction was in the range of 35% to 40%. Moderate diffuse   hypokinesis with distinct regional wall motion abnormalities.   Severe hypokinesis of the mid-apicalanteroseptal, anterior, and   apical myocardium; consistent with ischemia or infarction in the   distribution of the left anterior descending coronary artery.   There was fusion  of early and atrial contributions to ventricular   filling. The study is not technically sufficient to allow   evaluation of LV diastolic function. - Ventricular septum: Septal motion showed paradox. These changes   are consistent with a left bundle branch block. - Aortic valve: Valve area (VTI): 3.14 cm^2. Valve area (Vmax):   3.44 cm^2. Valve area (Vmean): 3.77 cm^2. - Right ventricle: Hypertrophy was present. - Pericardium, extracardiac: A trivial pericardial effusion was   identified.   Patient Profile     58 y.o. male admitted 03/06/2017 with a week h/o chest pain, transferred from [facility] and underwent emergent cath/PCI to LAD.  Assessment & Plan    1.Day 4 s/p  NSTEMI with post infarct angina Peak 30.65 > 23.94.   S/P PCI to LAD with persistent STE also contributed by ILBBB conduction abnormality.  Pt developed recurrent chest pain 12/30 am associated with diaphoresis, but different from prior discomfort; BP elevated to 160/110;  Started on Iv NTG and titrated to dose of 50 ug;  CP  improved and resolved;  ECG with persistent STE as noted post intervention and ILBBB pattern.  Tachycardic at 115 . Carvedilol was increased.  Tolerating 12.5 mg bid.  ECG  significantly improved with HR 79 and resolution of previous persistent STE. BNo recurrent chest pain  2. EF 35 - 40% c/w ischemic cardiomyopathy with  LAD distribution wall motion abnormality  3. HTN: BP now 117/87. lisinopril 5 mg daily added 12/20.  4. HypoK K today 3.8  5. Initiate DVT with heparin sq.   6. Tobacco; discussed cessation   7. HLD: LDL 88 now on atorvastatin 80 mg.   8. R neck mass; present for 10 yrs.  9. CT: Marked prostatic enlargement. Scattered small sclerotic foci in the thoracolumbar spine, sacrum, left twelfth rib, pelvis, and right femur; early osseous metastatic disease versus multiple bone islands are primary considerations.  I ordeered a PSA to be drawn yesterday, but lab came by late to draw after the PSA samples had already been sent off; therefore, not drawn. Cannot be drwn this am in lab since too long a delay befor shipped out since today is a holiday.   Need to have PSA drawn as outpatient and may need elective outpatient whole-body bone scintigraphy and useful for further evaluation.  Pt was not transferred to stepdown yesterday due to bed unavailabilty.  Ambulated well. DC today.    Signed, Troy Sine, MD, [facility] 03/10/2018, 10:28 AM

## 2018-03-10 NOTE — Discharge Summary (Signed)
Discharge Summary    Patient ID: Daniel Weiss MRN: [id number]; DOB: [birth year]  Admit date: 03/06/2018 Discharge date: 03/10/2018  Primary Care Provider: Patient, No Pcp Per  Primary Cardiologist: Glenetta Hew, MD  Primary Electrophysiologist:  None   Discharge Diagnoses    Principal Problem:   Non-ST elevation (NSTEMI) myocardial infarction [facility])  **Status post PCI and drug-eluting stent placement to the LAD this admission. Active Problems:   Postmyocardial infarction angina as complication of acute myocardial infarction [facility])   CAD (coronary artery disease)   Essential hypertension   Tobacco abuse   Ischemic cardiomyopathy   Mass of right side of neck   History of TIA (transient ischemic attack)   Warthin's tumor   Sinus tachycardia   Hypokalemia   Hyperlipidemia LDL goal <70   Enlarged prostate   Abnormal CT scan     Allergies No Known Allergies  Diagnostic Studies/Procedures    Cardiac Catheterization and Percutaneous Coronary Intervention 12.28.2019  Left Main  Vessel is large.  Left Anterior Descending  Prox LAD lesion 85% stenosed with side branch in Ost 1st Diag 45% stenosed  Prox LAD lesion is 85% stenosed with 45% stenosed side branch in Ost 1st Diag. Vessel is the culprit lesion. The lesion is type C, located at the bifurcation and eccentric.      **The LAD was successfully stented with a 3.5 x 15 mm Sierra drug-eluting stent.**  First Diagonal Branch  Vessel is moderate in size.  Second Diagonal Branch  Vessel is small in size.  Third Diagonal Branch  Vessel is small in size.  Ramus Intermedius  Vessel is large. Moderate-large  Ost Ramus lesion 55% stenosed  Ost Ramus lesion is 55% stenosed. The lesion is focal. Ostial  Left Circumflex  Vessel is moderate in size.  First Obtuse Marginal Branch  Vessel is small in size.  Second Obtuse Marginal Branch  Vessel is small in size.  Left Atrioventricular Groove Continuation  Vessel is small in  size.  Right Coronary Artery  Vessel is normal in caliber. Vessel is angiographically normal. Downward takeoff (high anterior)  Acute Marginal Branch  Vessel is small in size.  Right Posterior Descending Artery  Vessel is moderate in size.  Inferior Septal  Vessel is small in size.  Right Posterior Atrioventricular Branch  Vessel is small in size.  Second Right Posterolateral  Vessel is small in size.   Left Ventricle The left ventricular size is in the upper limits of normal. There is moderate to severe left ventricular systolic dysfunction. LV end diastolic pressure is mildly elevated. The left ventricular ejection fraction is 25-35% by visual estimate. There are LV function abnormalities due to segmental dysfunction. There is trivial (1+) mitral regurgitation. Visually, EF appears to be roughly 30%.  _____________  2D Echocardiogram 12.29.2019  Study Conclusions   - Left ventricle: There was moderate concentric hypertrophy.   Systolic function was moderately reduced. The estimated ejection   fraction was in the range of 35% to 40%. Moderate diffuse   hypokinesis with distinct regional wall motion abnormalities.   Severe hypokinesis of the mid-apicalanteroseptal, anterior, and   apical myocardium; consistent with ischemia or infarction in the   distribution of the left anterior descending coronary artery.   There was fusion of early and atrial contributions to ventricular   filling. The study is not technically sufficient to allow   evaluation of LV diastolic function. - Ventricular septum: Septal motion showed paradox. These changes   are consistent with a left bundle  branch block. - Aortic valve: Valve area (VTI): 3.14 cm^2. Valve area (Vmax):   3.44 cm^2. Valve area (Vmean): 3.77 cm^2. - Right ventricle: Hypertrophy was present. - Pericardium, extracardiac: A trivial pericardial effusion was   identified. _____________    History of Present Illness     58 year old male  with a history of tobacco abuse, neck mass/Warthin's tumor, and TIA who presented to the emergency department on December 28 with a one-week history of intermittent chest pain.  In the emergency department, he was found to be in sinus tachycardia with a left bundle branch block.  He also complained of weakness in his left arm, abdominal pain, and sharp back pain.  In that setting, he underwent CTA of the chest, abdomen, and pelvis, which was negative for PE or dissection.  He was incidentally noted to have an ectatic aortic arch and abdominal aorta as well as an enlarged prostate and scattered small sclerotic foci (further details in CTA report below).  Troponin returned at 27.69 and at that point, it was felt that presentation was most consistent with acute coronary syndrome.  He was evaluated by interventional cardiology and taken to the Cath Lab emergently.  Hospital Course     Consultants: None  Patient underwent emergent diagnostic catheterization, revealing severe proximal LAD disease and otherwise nonobstructive disease.  EF was 25 to 35%.  The LAD was felt to be the culprit vessel and this was successfully treated using a 3.5 x 15 mm Sierra drug-eluting stent.  Patient tolerated the procedure well and post procedure was monitored in the coronary intensive care unit where he eventually peaked his troponin at 30.65.  Follow-up echocardiogram confirmed LV dysfunction with an EF of 35 to 40%.  In that setting, he was placed on beta-blocker and ACE inhibitor therapy and he was also treated post ACS with aspirin, Brilinta, and high potency statin.  He continued to have intermittent chest discomfort and remained tachycardic post PCI.  He did require intravenous IV nitroglycerin which was subsequently discontinued in favor of further titration of beta-blocker.  He did well off of beta-blocker therapy and was evaluated by cardiac rehabilitation.  He was able to ambulate without recurrent symptoms or limitations  and an outpatient referral for cardiac rehab was made.  Daniel Weiss is had no further chest discomfort and is felt to be stable for discharge this morning.  Of note, due to timing issues with drawing of the PSA and our laboratory, a PSA was not able to be formally drawn as recommended by radiology.  This will need to be checked in the outpatient setting at follow-up office visit.  It has also been recommended that patient undergo elective outpatient whole body bone scintigraphy given abnormalities noted on CT this admission.  We will arrange for office follow-up within the next 7 to 14 days, at which point we can address these issues. _____________  Discharge Vitals Blood pressure 117/87, pulse 74, temperature 98.6 F (37 C), temperature source Oral, resp. rate (!) 22, height 6' (1.829 m), weight 60.9 kg, SpO2 100 %.  Filed Weights   03/06/18 0928 03/09/18 0600  Weight: 66.7 kg 60.9 kg    Labs & Radiologic Studies    CBC Lab Results  Component Value Date   WBC 9.0 03/07/2018   HGB 13.6 03/07/2018   HCT 41.5 03/07/2018   MCV 79.3 (L) 03/07/2018   PLT 268 96/28/3662    Basic Metabolic Panel Recent Labs    03/08/18 0937 03/10/18 0417  NA  135 134*  K 3.8 3.7  CL 101 101  CO2 20* 25  GLUCOSE 116* 109*  BUN 5* 7  CREATININE 0.78 0.75  CALCIUM 8.9 8.8*   Liver Function Tests Lab Results  Component Value Date   ALT 24 03/07/2018   AST 62 (H) 03/07/2018   ALKPHOS 57 03/07/2018   BILITOT 0.9 03/07/2018    Cardiac Enzymes Recent Labs    03/08/18 0921  TROPONINI 18.33*   Lab Results  Component Value Date   HGBA1C 5.3 03/07/2018    Fasting Lipid Panel Lab Results  Component Value Date   CHOL 147 03/07/2018   HDL 38 (L) 03/07/2018   LDLCALC 88 03/07/2018   TRIG 107 03/07/2018   CHOLHDL 3.9 03/07/2018   _____________  Dg Chest Portable 1 View  Result Date: 03/06/2018 CLINICAL DATA:  Pt reports sharp intermittent left sided CP radiating into his left arm x 1 week.  Denies N/V SOB. Reports some dizziness with the episodes EXAM: PORTABLE CHEST - 1 VIEW COMPARISON:  06/29/2015 FINDINGS: Lungs are clear. Heart size and mediastinal contours are within normal limits. No effusion.  No pneumothorax. Visualized bones unremarkable. IMPRESSION: No acute cardiopulmonary disease. Electronically Signed   By: Lucrezia Europe M.D.   On: 03/06/2018 10:42   Ct Angio Chest/abd/pel For Dissection W &/or Wo Contrast  Result Date: 03/06/2018 CLINICAL DATA:  Dizziness; chest pain x 6 hours; no back pain; abdomen pain earlier today; n/v yesterday, none today; SOB; lightheadedness; no heart issues; EXAM: CT ANGIOGRAPHY CHEST, ABDOMEN AND PELVIS TECHNIQUE: Multidetector CT imaging through the chest, abdomen and pelvis was performed using the standard protocol during bolus administration of intravenous contrast. Multiplanar reconstructed images and MIPs were obtained and reviewed to evaluate the vascular anatomy. CONTRAST:  174mL ISOVUE-370 IOPAMIDOL (ISOVUE-370) INJECTION 76% COMPARISON:  None. FINDINGS: CTA CHEST FINDINGS Cardiovascular: Heart size normal. Trace pericardial effusion. The RV is nondilated. Good contrast opacification of pulmonary artery branches with no convincing filling defects to suggest acute PE. Breathing motion during the acquisition degrades some of the images. Adequate contrast opacification of the thoracic aorta with no evidence of dissection, aneurysm, or stenosis. Ectatic proximal arch 3 cm diameter. There is bovine variant brachiocephalic arch anatomy without proximal stenosis. Mediastinum/Nodes: No hilar or mediastinal adenopathy. Lungs/Pleura: No pleural effusion. No pneumothorax. Lungs are clear. Musculoskeletal: Mild DJD in bilateral shoulders. Negative for fracture. Nonspecific subcentimeter sclerotic foci in several thoracic vertebral bodies and left twelfth rib. Review of the MIP images confirms the above findings. CTA ABDOMEN AND PELVIS FINDINGS VASCULAR Aorta:  Eccentric nonocclusive mural thrombus throughout the ectatic abdominal aorta. No dissection, stenosis, or aneurysm. Scattered calcified atheromatous plaque. Celiac: Patent without evidence of aneurysm, dissection, vasculitis or significant stenosis. SMA: Patent without evidence of aneurysm, dissection, vasculitis or significant stenosis. Renals: Both renal arteries are patent without evidence of aneurysm, dissection, vasculitis, fibromuscular dysplasia or significant stenosis. IMA: Origin stenosis related to aortic wall plaque, patent distally. Inflow: Scattered calcified atheromatous plaque through bilateral common iliac arteries without dissection or stenosis. The right common iliac artery is ectatic up to 1.3 cm, left 1.4 cm. 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 Mild atheromatous change in bilateral external iliac arteries without stenosis. Visualized proximal outflow patent. Veins: No obvious venous abnormality within the limitations of this arterial phase study. Review of the MIP images confirms the above findings. NON-VASCULAR Hepatobiliary: Several low-attenuation well-defined hepatic lesions largest 1.2 cm in segment 6, presumably cysts in the absence  of a history of primary carcinoma. Gallbladder unremarkable. No biliary ductal dilatation. Pancreas: Unremarkable. No pancreatic ductal dilatation or surrounding inflammatory changes. Spleen: Small, unremarkable, without focal lesion. Adrenals/Urinary Tract: Adrenal glands are unremarkable. Kidneys are normal, without renal calculi, focal lesion, or hydronephrosis. Bladder is unremarkable. Stomach/Bowel: Stomach is incompletely distended. Small bowel is nondilated. Appendix is not discretely identified. No pericecal inflammatory/edematous change. The colon is nondilated, unremarkable. Lymphatic: Subcentimeter left para-aortic and aortocaval lymph nodes. No pelvic or mesenteric adenopathy.  Reproductive: Marked prostatic enlargement protruding into the urinary bladder. Other: Left pelvic phleboliths. No ascites. No free air. Musculoskeletal: Scattered small sclerotic foci in lumbar spine, sacrum, pelvis, right femoral neck. No fracture or acute abnormality. Review of the MIP images confirms the above findings. IMPRESSION: 1. Negative for acute PE or thoracic aortic dissection. 2. Ectatic 3 cm aortic arch. Recommend annual imaging followup by CTA or MRA. This recommendation follows 2010 ACCF/AHA/AATS/ACR/ASA/SCA/SCAI/SIR/STS/SVM Guidelines for the Diagnosis and Management of Patients with Thoracic Aortic Disease. Circulation.2010; 121: X323-F573 3. Ectatic atheromatous abdominal aorta at risk for aneurysm development. Recommend followup by ultrasound in 5 years. This recommendation follows ACR consensus guidelines: White Paper of the ACR Incidental Findings Committee II on Vascular Findings. J Am Coll Radiol 2013; 10:789-794. 4. Ectatic bilateral common iliac arteries, 1.4 cm left and 1.3 cm right. 5. Origin occlusion of the left internal iliac artery. 6. Marked prostatic enlargement. 7. Scattered small sclerotic foci in the thoracolumbar spine, sacrum, left twelfth rib, pelvis, and right femur; early osseous metastatic disease versus multiple bone islands are primary considerations. Elective outpatient whole-body bone scintigraphy and PSA may be useful for further evaluation. Critical Value/emergent results were called by telephone at the time of interpretation on 03/06/2018 at 10:39 am to Dr. Gerlene Fee , who verbally acknowledged these results. Electronically Signed   By: Lucrezia Europe M.D.   On: 03/06/2018 10:41   Disposition   Pt is being discharged home today in good condition.  Follow-up Plans & Appointments    Follow-up Information    [name], MD Follow up.   Specialty:  Cardiology Why:  We will arrange for follow-up within the next 1 to 2 weeks, and contact you. Contact  information: [address] [place] [address] [phone]          Discharge Instructions    Amb Referral to Cardiac Rehabilitation   Complete by:  As directed    To [place]   Diagnosis:   Coronary Stents PTCA NSTEMI        Discharge Medications   Allergies as of 03/10/2018   No Known Allergies     Medication List    STOP taking these medications   amLODipine 2.5 MG tablet Commonly known as:  NORVASC     TAKE these medications   acetaminophen 325 MG tablet Commonly known as:  TYLENOL Take 2 tablets (650 mg total) by mouth every 4 (four) hours as needed for mild pain (temperature >/= 99.5 F).   aspirin 81 MG EC tablet Take 1 tablet (81 mg total) by mouth daily.   atorvastatin 80 MG tablet Commonly known as:  LIPITOR Take 1 tablet (80 mg total) by mouth daily. What changed:    medication strength  how much to take   carvedilol 12.5 MG tablet Commonly known as:  COREG Take 1 tablet (12.5 mg total) by mouth 2 (two) times daily with a meal.   lisinopril 5 MG tablet Commonly known as:  PRINIVIL,ZESTRIL Take 1  tablet (5 mg total) by mouth daily. Start taking on:  March 11, 2018   nitroGLYCERIN 0.4 MG SL tablet Commonly known as:  NITROSTAT Place 1 tablet (0.4 mg total) under the tongue every 5 (five) minutes as needed for chest pain.   ticagrelor 90 MG Tabs tablet Commonly known as:  BRILINTA Take 1 tablet (90 mg total) by mouth 2 (two) times daily.        Acute coronary syndrome (MI, NSTEMI, STEMI, etc) this admission?: Yes.     AHA/ACC Clinical Performance & Quality Measures: 1. Aspirin prescribed? - Yes 2. ADP Receptor Inhibitor (Plavix/Clopidogrel, Brilinta/Ticagrelor or Effient/Prasugrel) prescribed (includes medically managed patients)? - Yes 3. Beta Blocker prescribed? - Yes 4. High Intensity Statin (Lipitor 40-80mg  or Crestor 20-40mg ) prescribed? - Yes 5. EF assessed during THIS hospitalization? - Yes 6. For EF <40%,  was ACEI/ARB prescribed? - Yes 7. For EF <40%, Aldosterone Antagonist (Spironolactone or Eplerenone) prescribed? - No - Reason:  relative hypotension 8. Cardiac Rehab Phase II ordered (Included Medically managed Patients)? - Yes     Outstanding Labs/Studies   F/u PSA and BMET at f/u visit in 7-10 days.  Duration of Discharge Encounter   Greater than 30 minutes including physician time.  Signed, Murray Hodgkins, NP 03/10/2018, 12:52 PM

## 2018-03-10 NOTE — Discharge Instructions (Signed)
**PLEASE REMEMBER TO BRING ALL OF YOUR MEDICATIONS TO EACH OF YOUR FOLLOW-UP OFFICE VISITS.  NO HEAVY LIFTING X 2 WEEKS. NO SEXUAL ACTIVITY X 2 WEEKS. NO DRIVING X 1 WEEK. NO SOAKING BATHS, HOT TUBS, POOLS, ETC., X 7 DAYS.   Radial Site Care Refer to this sheet in the next few weeks. These instructions provide you with information on caring for yourself after your procedure. Your caregiver may also give you more specific instructions. Your treatment has been planned according to current medical practices, but problems sometimes occur. Call your caregiver if you have any problems or questions after your procedure. HOME CARE INSTRUCTIONS  You may shower the day after the procedure.Remove the bandage (dressing) and gently wash the site with plain soap and water.Gently pat the site dry.   Do not apply powder or lotion to the site.   Do not submerge the affected site in water for 3 to 5 days.   Inspect the site at least twice daily.   Do not flex or bend the affected arm for 24 hours.   No lifting over 5 pounds (2.3 kg) for 5 days after your procedure.   Do not drive home if you are discharged the same day of the procedure. Have someone else drive you.   What to expect:  Any bruising will usually fade within 1 to 2 weeks.   Blood that collects in the tissue (hematoma) may be painful to the touch. It should usually decrease in size and tenderness within 1 to 2 weeks.  SEEK IMMEDIATE MEDICAL CARE IF:  You have unusual pain at the radial site.   You have redness, warmth, swelling, or pain at the radial site.   You have drainage (other than a small amount of blood on the dressing).   You have chills.   You have a fever or persistent symptoms for more than 72 hours.   You have a fever and your symptoms suddenly get worse.   Your arm becomes pale, cool, tingly, or numb.   You have heavy bleeding from the site. Hold pressure on the site.   _____________      10 Habits of  Highly Healthy People  [facility] wants to help you get well and stay well.  Live a longer, healthier life by practicing healthy habits every day.  1.  Visit your primary care provider regularly. 2.  Make time for family and friends.  Healthy relationships are important. 3.  Take medications as directed by your provider. 4.  Maintain a healthy weight and a trim waistline. 5.  Eat healthy meals and snacks, rich in fruits, vegetables, whole grains, and lean proteins. 6.  Get moving every day - aim for 150 minutes of moderate physical activity each week. 7.  Don't smoke. 8.  Avoid alcohol or drink in moderation. 9.  Manage stress through meditation or mindful relaxation. 10.  Get seven to nine hours of quality sleep each night.  Want more information on healthy habits?  To learn more about these and other healthy habits, visit [email or web address]. _____________       You have received care from [facility] during this hospital stay and we look forward to continuing to provide you with excellent care in our office settings after you've left the hospital.  In order to assure a smoother transition to home following your discharge from the hospital, we will likely have you see one of our nurse practitioners or physician assistants within a   few weeks of discharge.  Our advanced practice providers work closely with your physician in order to address all of your heart's needs in a timely manner.  More information about all of our providers may be found here: [email or web address]  Please plan to bring all of your prescriptions to your follow-up appointment and don't hesitate to contact us with questions or concerns.  [facility] [place] - [phone] [facility] [place] - [phone] [facility] [address] - [phone] [facility] [place] - [phone] [facility] [place] - [phone] [facility] [place] - [phone] [facility] [place] - [phone] [facility] [place] - [phone] [facility] [place] - [phone]  

## 2018-03-11 ENCOUNTER — Telehealth: Payer: Self-pay | Admitting: *Deleted

## 2018-03-11 NOTE — Telephone Encounter (Signed)
Left message for patient to call and schedule 1 week TOC visit

## 2018-03-18 NOTE — Telephone Encounter (Signed)
Left message for patient to call and schedule TOC visit with Dr. Ellyn Hack or APP

## 2018-03-25 ENCOUNTER — Encounter: Payer: Self-pay | Admitting: Cardiology

## 2018-03-25 NOTE — Progress Notes (Signed)
PCP: Patient, No Pcp Per  Clinic Note: Chief Complaint  Patient presents with  . Hospitalization Follow-up    recent delayed presentation of Anterior MI with post--infarction angina --> ruled in --> LAD PCI  . Coronary Artery Disease    recen LAD PCI  . Cardiomyopathy    ischemic    HPI: Daniel Weiss is a 58 y.o. 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  He was recently admitted on 03/06/2018 to [facility] with likely post infarct angina from delayed presentation of an anterior MI.  Recent Hospitalizations: \  March 06, 2018-March 10, 2018 -"non-STEMI " -delayed presentation of anterior STEMI with postinfarction angina -> PCI to LAD.  Noted to have ischemic cardiomyopathy.  Initial evaluation was thought to be mostly GI related.  He had abdominal pain and weakness.  Was noted to have sinus tachycardia with a left bundle branch block that may have had Scarbosa criteria for anterior STEMI.  As part of his evaluation he underwent CTA of the chest abdomen pelvis, but then ruled in with elevated troponin of 27.69.  He was therefore transferred to [facility] as an acute/urgent non-STEMI and found to have a severe LAD lesion treated with DES stent. -EF by LV gram was 25 to 35% and by echo was 35 to 40% shortly thereafter.  He required a short course of IV nitroglycerin for hypertension and post PCI chest pain.  His carvedilol was titrated up to 12-1/2 twice daily.  Plans were to start lisinopril 5 mg on day 1 post discharge.  Studies Personally Reviewed - (if available, images/films reviewed: From Epic Chart or Care Everywhere)  Cardiac Cath-PCI 03/06/2018: Proximal LAD 85% stenosis involving D1 (DES PCI with Xience Sierra 3.5 mm x 15 mm-3.9 mm).  Ostial RI 55%.  Severe LV  dysfunction with EF of 25-35%.  1+ MR.  Minimally elevated LVEDP.Marland Kitchen  Intervention      Transthoracic Echo 03/06/2018: Mild reduced EF 35-40%.  Diffuse hypokinesis (severe HK of the mid-apical anteroseptal, anterior and apical myocardium consistent with LAD infarct).  Unable to assess diastolic function.  Paradoxical septal motion likely related to his left bundle branch block.  RV hypertrophy noted.  Trivial pericardial effusion noted.  Interval History: Daniel Weiss presents today noting that the main symptom he notes is exertional dyspnea & fatigue - indicating that he gets winded walking around the block.  He has not had any of his anginal/MI related chest pain since leaving the hospital though.  He also denies PND, orthopnea or edema.  No sensation of rapid or irregular heartbeats.   He has noted that he feels dizzy off and on if he gets up and goes too fast, but otherwise is actually felt like he is progressing fairly well.  (I am not too sure that he fully conceptualizes the nature of his MI & Cardiomyopathy -- I spent ~10 + of the 40 min with him simply explaining his MI, reviewing images & Echo reports along with the ramifications - notably of delayed presentation).   No syncope/near syncope. No TIA/amaurosis fugax symptoms. No melena, hematochezia, hematuria, or epstaxis. No claudication.  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  A comprehensive was performed. Review of Systems  Constitutional: Positive  for malaise/fatigue (wears out fast).  Respiratory: Positive for shortness of breath (per HPI - on exertion).   Musculoskeletal: Negative for falls and joint pain.  Neurological: Positive for dizziness. Negative for focal weakness.  Psychiatric/Behavioral: Positive for depression (maybe more overwhelmed by his MI- etc.).  All other systems reviewed and are negative.  I have reviewed and (if needed) personally updated the patient's  problem list, medications, allergies, past medical and surgical history, social and family history.   Past Medical History:  Diagnosis Date  . Abnormal chest CT    a. Ectatic 3 cm Ao arch - rec f/u outpt imaging w/ CTA or MRA. Ectatic atheromatous abd Ao @ risk for aneurysm - rec f/u u/s in 5 yrs. Marked prostatic enlargement w/ scattered small sclerotic foci in the thoracic lower lumbar spine, sacrum, left 12th rib, pelvis, and right femur.  Recommend elective outpatient whole-body bone scintigraphy and PSA.  Marland Kitchen CAD (coronary artery disease)    a. 02/2018 ACS/PCI: LM nl, LAD 85p (3.5x15 Sierra DES), D1 45ost, RI 55ost, RCA nl, EF 25-35%.  . Complete left bundle branch block (LBBB) 2016  . Current every day smoker   . HFrEF (heart failure with reduced ejection fraction) ([place])   . Hypertension   . Ischemic cardiomyopathy 02/2018   a. 02/2018 Echo: EF 35-40% (LV gram on cath was 25-30%), mod, diff HK. mid-apicalanteroseptal, ant, and apical sev HK. RVH.  Marland Kitchen TIA (transient ischemic attack) 2016  . Warthin's tumor     Past Surgical History:  Procedure Laterality Date  . CORONARY STENT INTERVENTION N/A 03/06/2018   Procedure: CORONARY STENT INTERVENTION;  Surgeon: [name], MD;  Location: [place] [facility];  Service: Cardiovascular: Proximal LAD 85% stenosis (and D1): DES PCI with Xience Sierra DES 3.5 mm x 15 mm - 3.9 mm  . INTRAOPERATIVE TRANSTHORACIC ECHOCARDIOGRAM  03/06/2018   (Peri-MI) mild reduced EF 35-40%.  Diffuse hypokinesis (severe HK of the mid-apical anteroseptal, anterior and apical myocardium consistent with LAD infarct).  Unable to assess diastolic function.  Paradoxical septal motion likely related to his LBBB.  RV hypertrophy noted.  Trivial pericardial effusion noted.   Marland Kitchen LEFT HEART CATH AND CORONARY ANGIOGRAPHY N/A 03/06/2018   Procedure: LEFT HEART CATH AND CORONARY ANGIOGRAPHY;  Surgeon: [name], MD;  Location: [place] [facility];  Service: Cardiovascular:  Proximal LAD 85% involving D1 45%.  Ostial RI 55%.  Severe LV dysfunction EF 25 to 30%.  1+ MR.  Only minimally elevated LVEDP.    Current Meds  Medication Sig  . acetaminophen (TYLENOL) 325 MG tablet Take 2 tablets (650 mg total) by mouth every 4 (four) hours as needed for mild pain (temperature >/= 99.5 F).  Marland Kitchen aspirin EC 81 MG EC tablet Take 1 tablet (81 mg total) by mouth daily.  Marland Kitchen atorvastatin (LIPITOR) 80 MG tablet Take 1 tablet (80 mg total) by mouth daily.  . carvedilol (COREG) 12.5 MG tablet Take 1 tablet (12.5 mg total) by mouth 2 (two) times daily with a meal.  . nitroGLYCERIN (NITROSTAT) 0.4 MG SL tablet Place 1 tablet (0.4 mg total) under the tongue every 5 (five) minutes as needed for chest pain.  . ticagrelor (BRILINTA) 90 MG TABS tablet Take 1 tablet (90 mg total) by mouth 2 (two) times daily.  . [DISCONTINUED] lisinopril (PRINIVIL,ZESTRIL) 5 MG tablet Take 1 tablet (5 mg total) by mouth daily.    No Known Allergies  Social History   Tobacco Use  . Smoking  status: Current Every Day Smoker    Packs/day: 0.50    Types: Cigarettes  . Smokeless tobacco: Former Systems developer    Quit date: 02/23/2018  Substance Use Topics  . Alcohol use: Yes    Alcohol/week: 2.0 standard drinks    Types: 2 Cans of beer per week    Comment: 2 Beers daily.  . Drug use: Yes    Frequency: 3.0 times per week    Types: Marijuana   Social History   Social History Narrative  . Not on file    family history includes Breast cancer in an other family member; Diabetes type II in his mother; Hypertension in his mother; Leukemia in an other family member; Stroke in his mother.  Wt Readings from Last 3 Encounters:  03/26/18 145 lb 6.4 oz (66 kg)  03/09/18 134 lb 4.2 oz (60.9 kg)  06/29/15 139 lb (63 kg)    PHYSICAL EXAM BP 134/74   Pulse (!) 59   Ht 6' (1.829 m)   Wt 145 lb 6.4 oz (66 kg)   BMI 19.72 kg/m  Physical Exam  Constitutional: He appears well-developed and well-nourished. No  distress.  Otherwise healthy-appearing  HENT:  Head: Normocephalic and atraumatic.  Neck: Normal range of motion. Neck supple. No JVD present.  R neck mass - swollen LN/tumor - OLD  Cardiovascular: Normal rate, regular rhythm, normal heart sounds, intact distal pulses and normal pulses. PMI is not displaced. Exam reveals no gallop and no friction rub.  No murmur heard. Vitals reviewed.    Adult ECG Report  Rate: 54 ;  Rhythm: normal sinus rhythm and Borderline right axis deviation.  LBBB.;   Narrative Interpretation: Abnormal EKG   Other studies Reviewed: Additional studies/ records that were reviewed today include:  Recent Labs:   Lab Results  Component Value Date   CHOL 147 03/07/2018   HDL 38 (L) 03/07/2018   LDLCALC 88 03/07/2018   TRIG 107 03/07/2018   CHOLHDL 3.9 03/07/2018   Lab Results  Component Value Date   CREATININE 0.75 03/10/2018   BUN 7 03/10/2018   NA 134 (L) 03/10/2018   K 3.7 03/10/2018   CL 101 03/10/2018   CO2 25 03/10/2018   ASSESSMENT / PLAN: Problem List Items Addressed This Visit    Complete left bundle branch block (LBBB)    This bundle branch block did predate his MI.  I think it may make terminate his EF little more difficult however.  Continue to monitor.  At present seems relatively euvolemic.      Relevant Medications   lisinopril (PRINIVIL,ZESTRIL) 10 MG tablet   Coronary artery disease involving native coronary artery of native heart with angina pectoris ([place]) - Primary (Chronic)    DES PCI to LAD.  Remains on aspirin plus Brilinta.  Need to pay attention to his breathing issues, it may be that some of his dyspnea is related to Brilinta.  Continue to follow. He is on high-dose statin and stable dose beta-blocker.  Continue aspirin plus Brilinta hopefully uninterrupted for 1 year. Titrate ACE inhibitor to 10 mg daily.  Refer to Cardiac Rehab OK to return to work the last week of Feb - light work x ~2-3 weeks minimum.       Relevant Medications   lisinopril (PRINIVIL,ZESTRIL) 10 MG tablet   Other Relevant Orders   ECHOCARDIOGRAM COMPLETE   AMB referral to cardiac rehabilitation   Essential hypertension (Chronic)    Blood pressure is stable on carvedilol and lisinopril, but  we do have room to titrate up to 10 mg lisinopril given cardiomyopathy.      Relevant Medications   lisinopril (PRINIVIL,ZESTRIL) 10 MG tablet   Hyperlipidemia LDL goal <70 (Chronic)    Lipids at the time of his MI did not look that bad.  He is now on high-dose statin.  Will reassess in March timeframe and determine if any additional therapy is needed.      Relevant Medications   lisinopril (PRINIVIL,ZESTRIL) 10 MG tablet   Other Relevant Orders   Lipid panel   Ischemic cardiomyopathy (Chronic)    Moderate reduced EF of 35 to 40% which is improved from his initial LV gram.  I do not know if some of the hypokinesis he was related to bundle branch block is much that is related to the MI.  He is euvolemic on exam and therefore does not require diuretic.  He did have a short course of diuretic in the hospital. He is on stable dose of carvedilol which is probably max tolerable case based on his heart rate of 59.  Will titrate up ACE inhibitor to 10 mg daily.  Plan will be to reassess an echo in roughly 3 to 4 months post MI.      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    Thankfully, his LAD was not 100% indicating natural recanalization of original lesion, however, with revascularization, we could potentially expect some recovery on optimal Meds.  Discussed  return to work -- at the least ~ 4 weeks ---> work note written, but to start with light duty x 2 weeks.  I also recommended Cardiac Rehab (which may affect work some), but this should be considered a priority.  He is on max tolerated carvedilol present.  Will increase lisinopril to 10 mg. Euvolemic.       Relevant Medications   lisinopril (PRINIVIL,ZESTRIL) 10 MG tablet   Presence of drug coated stent in LAD coronary artery    Currently on aspirin plus Brilinta.  Monitor for dyspnea side effect.          I spent a total of 30-40 minutes with the patient and chart review. >  50% of the time was spent in direct patient consultation.   Current medicines are reviewed at length with the patient today.  (+/- concerns) N/A The following changes have been made:  see below.  Patient Instructions  Medication Instructions:    INCREASE TO LISINOPRIL TO 10 MG DAILY  If you need a refill on your cardiac medications before your next appointment, please call your pharmacy.   Lab work: LABS NEEDED IN 3 MONTHS  April 2020  CMP LIPID  WILL Lucendia Herrlich IN MARCH 2020 If you have labs (blood work) drawn today and your tests are completely normal, you will receive your results only by: Marland Kitchen MyChart Message (if you have MyChart) OR . A paper copy in the mail If you have any lab test that is abnormal or we need to change your treatment, we will call you to review the results.  Testing/Procedures:  WILL BE REFERRED FOR CARDIAC REHAB  IN [place]- [facility] WILL CONTACT YOU ABOUT CLASS   SCHEDULE AT [place] [facility]- April 2020 Your physician has requested that you have an echocardiogram. Echocardiography is a painless test that uses sound waves to create images of your heart. It provides your doctor with information about the size and shape of your heart and how well your heart's chambers and valves are working. This procedure takes approximately one hour. There are no  restrictions for this procedure.   Follow-Up: At [facility], you and your health needs are our priority.  As part of our continuing mission to provide you with exceptional heart care, we have created designated Provider Care Teams.  These Care Teams include your primary Cardiologist (physician) and Advanced Practice Providers (APPs -  Physician Assistants and Nurse Practitioners) who all work together to provide you with the care you need, when you need it. You will need a follow up appointment in 3 months.  Please call our office 2 months in advance to schedule this appointment.  You may see Glenetta Hew, MD or one of the following Advanced Practice Providers on your designated Care Team:   Rosaria Ferries, PA-C . Jory Sims, DNP, ANP  Any Other Special Instructions Will Be Listed Below (If Applicable).  MAY RETURN TO WORK ON Apr 05 2018- FOR THE FIRST MONTH , - NO HAVE LIFTING OF 25 LBS , @ EVERY 2 HOURS TAKE A 10 MIN BREAK , THEN May 06, 2018 MAY WORK WITHOUT RESTRICTIONS    Studies Ordered:   Orders Placed This Encounter  Procedures  . Lipid panel  . Comprehensive metabolic panel  . AMB referral to cardiac rehabilitation  . ECHOCARDIOGRAM COMPLETE      Glenetta Hew, M.D., M.S. Interventional Cardiologist   Pager # [phone] Phone # [phone] [address] [place], [place] [address]   Thank you for choosing Heartcare at [facility]!!

## 2018-03-26 ENCOUNTER — Encounter: Payer: Self-pay | Admitting: Cardiology

## 2018-03-26 ENCOUNTER — Ambulatory Visit (INDEPENDENT_AMBULATORY_CARE_PROVIDER_SITE_OTHER): Payer: Self-pay | Admitting: Cardiology

## 2018-03-26 VITALS — BP 134/74 | HR 59 | Ht 72.0 in | Wt 145.4 lb

## 2018-03-26 DIAGNOSIS — I25119 Atherosclerotic heart disease of native coronary artery with unspecified angina pectoris: Secondary | ICD-10-CM

## 2018-03-26 DIAGNOSIS — I255 Ischemic cardiomyopathy: Secondary | ICD-10-CM

## 2018-03-26 DIAGNOSIS — Z955 Presence of coronary angioplasty implant and graft: Secondary | ICD-10-CM | POA: Insufficient documentation

## 2018-03-26 DIAGNOSIS — E785 Hyperlipidemia, unspecified: Secondary | ICD-10-CM

## 2018-03-26 DIAGNOSIS — I447 Left bundle-branch block, unspecified: Secondary | ICD-10-CM

## 2018-03-26 DIAGNOSIS — I237 Postinfarction angina: Secondary | ICD-10-CM

## 2018-03-26 DIAGNOSIS — I10 Essential (primary) hypertension: Secondary | ICD-10-CM

## 2018-03-26 MED ORDER — LISINOPRIL 10 MG PO TABS: 10.0000 mg | ORAL_TABLET | Freq: Every day | ORAL | 3 refills | Status: DC

## 2018-03-26 NOTE — Patient Instructions (Signed)
Medication Instructions:    INCREASE TO LISINOPRIL TO 10 MG DAILY  If you need a refill on your cardiac medications before your next appointment, please call your pharmacy.   Lab work: LABS NEEDED IN 3 MONTHS  April 2020  CMP LIPID  WILL Lucendia Herrlich IN MARCH 2020 If you have labs (blood work) drawn today and your tests are completely normal, you will receive your results only by: Marland Kitchen MyChart Message (if you have MyChart) OR . A paper copy in the mail If you have any lab test that is abnormal or we need to change your treatment, we will call you to review the results.  Testing/Procedures:  WILL BE REFERRED FOR CARDIAC REHAB IN [place]- [facility] WILL CONTACT YOU ABOUT CLASS   SCHEDULE AT [place] [address]- April 2020 Your physician has requested that you have an echocardiogram. Echocardiography is a painless test that uses sound waves to create images of your heart. It provides your doctor with information about the size and shape of your heart and how well your heart's chambers and valves are working. This procedure takes approximately one hour. There are no restrictions for this procedure.   Follow-Up: At [facility], you and your health needs are our priority.  As part of our continuing mission to provide you with exceptional heart care, we have created designated Provider Care Teams.  These Care Teams include your primary Cardiologist (physician) and Advanced Practice Providers (APPs -  Physician Assistants and Nurse Practitioners) who all work together to provide you with the care you need, when you need it. You will need a follow up appointment in 3 months.  Please call our office 2 months in advance to schedule this appointment.  You may see Glenetta Hew, MD or one of the following Advanced Practice Providers on your designated Care Team:   Rosaria Ferries, PA-C . Jory Sims, DNP, ANP  Any Other Special Instructions Will Be Listed Below (If  Applicable).  MAY RETURN TO WORK ON Apr 05 2018- FOR THE FIRST MONTH , - NO HAVE LIFTING OF 25 LBS , @ EVERY 2 HOURS TAKE A 10 MIN BREAK , THEN May 06, 2018 MAY WORK WITHOUT RESTRICTIONS

## 2018-03-28 ENCOUNTER — Encounter: Payer: Self-pay | Admitting: Cardiology

## 2018-03-28 NOTE — Assessment & Plan Note (Signed)
Lipids at the time of his MI did not look that bad.  He is now on high-dose statin.  Will reassess in March timeframe and determine if any additional therapy is needed.

## 2018-03-28 NOTE — Assessment & Plan Note (Signed)
I suspect that his actual MI was an Anterior STEMI that occurred 1-2 days prior to presentation with recurrent angina & CHF Sx.    Thankfully, his LAD was not 100% indicating natural recanalization of original lesion, however, with revascularization, we could potentially expect some recovery on optimal Meds.  Discussed return to work -- at the least ~ 4 weeks ---> work note written, but to start with light duty x 2 weeks.  I also recommended Cardiac Rehab (which may affect work some), but this should be considered a priority.  He is on max tolerated carvedilol present.  Will increase lisinopril to 10 mg. Euvolemic.

## 2018-03-28 NOTE — Assessment & Plan Note (Addendum)
DES PCI to LAD.  Remains on aspirin plus Brilinta.  Need to pay attention to his breathing issues, it may be that some of his dyspnea is related to Brilinta.  Continue to follow. He is on high-dose statin and stable dose beta-blocker.  Continue aspirin plus Brilinta hopefully uninterrupted for 1 year. Titrate ACE inhibitor to 10 mg daily.  Refer to Cardiac Rehab OK to return to work the last week of Feb - light work x ~2-3 weeks minimum.

## 2018-03-28 NOTE — Assessment & Plan Note (Signed)
Moderate reduced EF of 35 to 40% which is improved from his initial LV gram.  I do not know if some of the hypokinesis he was related to bundle branch block is much that is related to the MI.  He is euvolemic on exam and therefore does not require diuretic.  He did have a short course of diuretic in the hospital. He is on stable dose of carvedilol which is probably max tolerable case based on his heart rate of 59.  Will titrate up ACE inhibitor to 10 mg daily.  Plan will be to reassess an echo in roughly 3 to 4 months post MI.

## 2018-03-28 NOTE — Assessment & Plan Note (Signed)
Currently on aspirin plus Brilinta.  Monitor for dyspnea side effect.

## 2018-03-28 NOTE — Assessment & Plan Note (Signed)
This bundle branch block did predate his MI.  I think it may make terminate his EF little more difficult however.  Continue to monitor.  At present seems relatively euvolemic.

## 2018-03-28 NOTE — Assessment & Plan Note (Signed)
Blood pressure is stable on carvedilol and lisinopril, but we do have room to titrate up to 10 mg lisinopril given cardiomyopathy.

## 2018-04-06 ENCOUNTER — Telehealth: Payer: Self-pay | Admitting: *Deleted

## 2018-04-06 NOTE — Telephone Encounter (Signed)
FAXED INFORMATION  AND RECORDS TO  [facility]  FOR patient  TO START CARDIAC REHAB PHASE ll.

## 2018-04-13 NOTE — Addendum Note (Signed)
Addended by: Zebedee Iba on: 04/13/2018 09:28 AM   Modules accepted: Orders

## 2018-06-10 ENCOUNTER — Telehealth: Payer: Self-pay | Admitting: Cardiovascular Disease

## 2018-06-10 NOTE — Telephone Encounter (Signed)
Patient called and discussed cancelling elective echocardiogram due to concerns for Covid19 and need for social distancing . Patient stable with no urgent symptoms.  TTE scheduled for 4/13 can be rescheduled for 3-6 months  

## 2018-06-16 ENCOUNTER — Telehealth: Payer: Self-pay | Admitting: *Deleted

## 2018-06-16 NOTE — Telephone Encounter (Signed)
lmtcb - need schedule for e-visit for 4/17 w/dr harding or can reschedule for  Sept 2020 has not had echo yet.

## 2018-06-18 ENCOUNTER — Telehealth: Payer: Self-pay | Admitting: Cardiology

## 2018-06-18 NOTE — Telephone Encounter (Signed)
Lmsg. Pt needs evisit or r/s in august./4.10.20/dc

## 2018-06-21 ENCOUNTER — Other Ambulatory Visit (HOSPITAL_COMMUNITY): Payer: Self-pay

## 2018-06-24 ENCOUNTER — Telehealth: Payer: Self-pay | Admitting: Cardiology

## 2018-06-24 NOTE — Telephone Encounter (Signed)
[phone] virtual consent/ my chart pending/ pre reg completed

## 2018-06-24 NOTE — Telephone Encounter (Signed)
Spoke to patient - agreed for virtual visit  - not able to do vital -- no smartphone appt at 8 am

## 2018-06-24 NOTE — Telephone Encounter (Signed)
F/U Message          Patient returned call and he wants a Telephone visit only no computer or smart phone [phone]

## 2018-06-24 NOTE — Telephone Encounter (Signed)
LEFT MESSAGE TO CALL BACK BY 12 NOON TODAY OR 4/17 VISIT WILL BE CANCELLED

## 2018-06-25 ENCOUNTER — Encounter: Payer: Self-pay | Admitting: Cardiology

## 2018-06-25 ENCOUNTER — Telehealth (INDEPENDENT_AMBULATORY_CARE_PROVIDER_SITE_OTHER): Payer: Medicaid Other | Admitting: Cardiology

## 2018-06-25 ENCOUNTER — Telehealth: Payer: Self-pay | Admitting: Cardiology

## 2018-06-25 ENCOUNTER — Telehealth: Payer: Medicaid Other | Admitting: Cardiology

## 2018-06-25 ENCOUNTER — Other Ambulatory Visit: Payer: Self-pay

## 2018-06-25 VITALS — Ht 72.0 in | Wt 145.0 lb

## 2018-06-25 DIAGNOSIS — Z720 Tobacco use: Secondary | ICD-10-CM

## 2018-06-25 DIAGNOSIS — I25119 Atherosclerotic heart disease of native coronary artery with unspecified angina pectoris: Secondary | ICD-10-CM

## 2018-06-25 DIAGNOSIS — I10 Essential (primary) hypertension: Secondary | ICD-10-CM

## 2018-06-25 DIAGNOSIS — Z79899 Other long term (current) drug therapy: Secondary | ICD-10-CM

## 2018-06-25 DIAGNOSIS — R000 Tachycardia, unspecified: Secondary | ICD-10-CM

## 2018-06-25 DIAGNOSIS — E785 Hyperlipidemia, unspecified: Secondary | ICD-10-CM

## 2018-06-25 DIAGNOSIS — I255 Ischemic cardiomyopathy: Secondary | ICD-10-CM

## 2018-06-25 MED ORDER — CARVEDILOL 12.5 MG PO TABS: 12.5000 mg | ORAL_TABLET | Freq: Two times a day (BID) | ORAL | 3 refills | Status: DC

## 2018-06-25 NOTE — Assessment & Plan Note (Signed)
Delayed presentation of anterior MI with DES PCI to the LAD and moderate residual disease elsewhere.  Thankfully no further angina symptoms, but I think he may have had a pretty significant anterior wall infarct.  Plan:  Uninterrupted Brilinta for 1 year, continue aspirin for now as long as no significant bleeding.  After 1 year (as of January 2021) we will reduce to maintenance dose Brilinta or convert to Plavix.  Would likely continue that lifelong, but would be okay to stop for procedures at that time.  Continue current dose of beta-blocker and ACE inhibitor (would like to see what his blood pressures are running, but his sister says that the running fine).  High-dose-Statin  Continue to exercise.  Gradually increased amount of exercise.  Should be ready to return to full work

## 2018-06-25 NOTE — Patient Instructions (Addendum)
Medication Instructions:   No changes  If you need a refill on your cardiac medications before your next appointment, please call your pharmacy.   Lab work:  Needs CMP/Fasting Lipids & CBC (within 4-6 weeks)  If you have labs (blood work) drawn today and your tests are completely normal, you will receive your results only by: Marland Kitchen MyChart Message (if you have MyChart) OR . A paper copy in the mail If you have any lab test that is abnormal or we need to change your treatment, we will call you to review the results.  Testing/Procedures:  Will need to make sure the Echocardiogram is rescheduled (once COVID-19 restrictions are lifted.  Follow-Up: At [facility], you and your health needs are our priority.  As part of our continuing mission to provide you with exceptional heart care, we have created designated Provider Care Teams.  These Care Teams include your primary Cardiologist (physician) and Advanced Practice Providers (APPs -  Physician Assistants and Nurse Practitioners) who all work together to provide you with the care you need, when you need it. You will need a follow up appointment in 6-7 months  .  Please call our office 2 months in advance to schedule this appointment.  You may see Glenetta Hew, MD or one of the following Advanced Practice Providers on your designated Care Team:   Rosaria Ferries, PA-C . Jory Sims, DNP, ANP  Continue to stay active and exercise.  Gradually increase how much much you walk each week.. Do not hesitate to contact us if you have issues with recurrent chest pain, shortness of breath laying down flat or waking up short of breath, swelling in legs or passout spells.

## 2018-06-25 NOTE — Assessment & Plan Note (Addendum)
Lipids look pretty good at the time of his MI.  He is due for follow-up labs now.   He is on high-dose atorvastatin, tolerating well  With COVID-19 restrictions wearing a delay in coming in for another month or so, but will have him come in for CMP, FLP and CBC (CBC because of being on antiplatelet agent)

## 2018-06-25 NOTE — Telephone Encounter (Signed)
LMTCB

## 2018-06-25 NOTE — Progress Notes (Signed)
Virtual Visit via Telephone Note   This visit type was conducted due to national recommendations for restrictions regarding the COVID-19 Pandemic (e.g. social distancing) in an effort to limit this patient's exposure and mitigate transmission in our community.  Due to his co-morbid illnesses, this patient is at least at moderate risk for complications without adequate follow up.  This format is felt to be most appropriate for this patient at this time.  The patient did not have access to video technology/had technical difficulties with video requiring transitioning to audio format only (telephone).  All issues noted in this document were discussed and addressed.  No physical exam could be performed with this format.  Please refer to the patient's chart for his  consent to telehealth for [facility]   Patient has given verbal permission to conduct this visit via virtual appointment and to bill insurance 06/24/2018 5:27 AM    Mr. Habermann does not have access to a smart phone with camera or a computer.    Evaluation Performed:  Follow-up visit  Date:  06/25/2018   ID:  Robby Weiss, DOB 12-24-1960, MRN [id number]  Patient Location: Home Provider Location: Home  PCP:  Patient, No Pcp Per  Cardiologist:  Glenetta Hew, MD  Electrophysiologist:  None   Chief Complaint:  3 month f/u CAD-PCI (STEMI), ICM  History of Present Illness:    Daniel Weiss is a 58 y.o. 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 He underwent DES PCI of the proximal LAD that had a residual 85% stenosis.  Unfortunately as result was noted to have severe LV dysfunction with EF of 25 to 35% by LV gram. Thankfully, his EF is up to 35 to 40% post PCI prior to discharge.  Severe anterior  hypokinesis with paradoxical septal motion from left bundle branch block.  Daniel Weiss was last seen on January 17 only noting some exertional dyspnea and fatigue if he walks more than a block.  No further angina.  No PND orthopnea or edema. -> We increase his lisinopril to 10 mg daily.  He supposed to have labs prior to this follow-up visit.  Interval History: [name] states that he is still doing fairly well without any further angina symptoms.  No real heart failure symptoms, but he still runs out of breath "real fast" with walking.   Does go out side walking up & down the block a few times each day.    No further angina chest pain.  He says that he is just not as strong as he used to be - walking tires him out.Also can get lightheaded & dizzy -total of 3 times out of nowhere.  1 time getting out of the car & walking up the steps to the front door, he fell forward.  Another time walking down the hall -he just started wobbling.  Cardiovascular ROS: positive for - dyspnea on exertion and dizziness, less energy negative for - chest pain, edema, irregular heartbeat, orthopnea, palpitations, paroxysmal nocturnal dyspnea, rapid heart rate, shortness of breath or syncope, TIA/amaurosis fugax  The patient does have symptoms concerning for COVID-19 infection (fever, chills, cough, or new shortness of breath).  The patient is practicing social distancing.  Sister comes by to take him to the supermarket 1 x per  week - wears mask.   Quit smoking since last visit -- no longer "cheating" with 1-2 per day.  Was hard, but is happy.   ROS:  Please see the history of present illness.    Review of Systems  Constitutional: Negative for malaise/fatigue (per HPI).  HENT: Negative for congestion and nosebleeds.   Respiratory: Positive for shortness of breath (per HPI). Negative for cough, sputum production and wheezing.   Gastrointestinal: Positive for vomiting (a few times). Negative for blood in stool and melena.   Genitourinary: Negative for hematuria.       Difficulty voiding - dribbling.   Musculoskeletal: Negative for falls (3 episodes of almost falling).  Neurological: Positive for dizziness (per HPI). Negative for weakness (still a bit weak).  Endo/Heme/Allergies: Does not bruise/bleed easily.  Psychiatric/Behavioral: Negative for depression. The patient is not nervous/anxious.   All other systems reviewed and are negative.   Past Medical History:  Diagnosis Date  . Abnormal chest CT    a. Ectatic 3 cm Ao arch - rec f/u outpt imaging w/ CTA or MRA. Ectatic atheromatous abd Ao @ risk for aneurysm - rec f/u u/s in 5 yrs. Marked prostatic enlargement w/ scattered small sclerotic foci in the thoracic lower lumbar spine, sacrum, left 12th rib, pelvis, and right femur.  Recommend elective outpatient whole-body bone scintigraphy and PSA.  Marland Kitchen CAD (coronary artery disease)    a. 02/2018 ACS/PCI: LM nl, LAD 85p (3.5x15 Sierra DES), D1 45ost, RI 55ost, RCA nl, EF 25-35%.  . Complete left bundle branch block (LBBB) 2016  . Current every day smoker   . HFrEF (heart failure with reduced ejection fraction) ([place])   . Hypertension   . Ischemic cardiomyopathy 02/2018   a. 02/2018 Echo: EF 35-40% (LV gram on cath was 25-30%), mod, diff HK. mid-apicalanteroseptal, ant, and apical sev HK. RVH.  Marland Kitchen TIA (transient ischemic attack) 2016  . Warthin's tumor    Past Surgical History:  Procedure Laterality Date  . CORONARY STENT INTERVENTION N/A 03/06/2018   Procedure: CORONARY STENT INTERVENTION;  Surgeon: [name], MD;  Location: [place] [facility];  Service: Cardiovascular: Proximal LAD 85% stenosis (and D1): DES PCI with Xience Sierra DES 3.5 mm x 15 mm - 3.9 mm  . INTRAOPERATIVE TRANSTHORACIC ECHOCARDIOGRAM  03/06/2018   (Peri-MI) mild reduced EF 35-40%.  Diffuse hypokinesis (severe HK of the mid-apical anteroseptal, anterior and apical myocardium consistent with LAD infarct).  Unable to assess diastolic  function.  Paradoxical septal motion likely related to his LBBB.  RV hypertrophy noted.  Trivial pericardial effusion noted.   Marland Kitchen LEFT HEART CATH AND CORONARY ANGIOGRAPHY N/A 03/06/2018   Procedure: LEFT HEART CATH AND CORONARY ANGIOGRAPHY;  Surgeon: [name], MD;  Location: [place] [facility];  Service: Cardiovascular: Proximal LAD 85% involving D1 45%.  Ostial RI 55%.  Severe LV dysfunction EF 25 to 30%.  1+ MR.  Only minimally elevated LVEDP.     Current Meds  Medication Sig  . acetaminophen (TYLENOL) 325 MG tablet Take 2 tablets (650 mg total) by mouth every 4 (four) hours as needed for mild pain (temperature >/= 99.5 F).  Marland Kitchen aspirin EC 81 MG EC tablet Take 1 tablet (81 mg total) by mouth daily.  Marland Kitchen atorvastatin (LIPITOR) 80 MG tablet Take 1 tablet (80 mg total) by mouth daily.  . carvedilol (COREG) 12.5 MG tablet Take 1 tablet (12.5 mg total) by mouth 2 (two) times daily with a meal.  . lisinopril (PRINIVIL,ZESTRIL)  10 MG tablet Take 1 tablet (10 mg total) by mouth daily.  . nitroGLYCERIN (NITROSTAT) 0.4 MG SL tablet Place 1 tablet (0.4 mg total) under the tongue every 5 (five) minutes as needed for chest pain.  . ticagrelor (BRILINTA) 90 MG TABS tablet Take 1 tablet (90 mg total) by mouth 2 (two) times daily.  . [DISCONTINUED] carvedilol (COREG) 12.5 MG tablet Take 1 tablet (12.5 mg total) by mouth 2 (two) times daily with a meal.     Allergies:   Patient has no known allergies.   Social History   Tobacco Use  . Smoking status: Current Every Day Smoker    Packs/day: 0.50    Types: Cigarettes  . Smokeless tobacco: Former Systems developer    Quit date: 02/23/2018  Substance Use Topics  . Alcohol use: Yes    Alcohol/week: 2.0 standard drinks    Types: 2 Cans of beer per week    Comment: 2 Beers daily.  . Drug use: Yes    Frequency: 3.0 times per week    Types: Marijuana     Family Hx: The patient's family history includes Breast cancer in an other family member; Diabetes type II in  his mother; Hypertension in his mother; Leukemia in an other family member; Stroke in his mother. There is no history of CAD.   Prior CV studies:   The following studies were reviewed today: . Echo planned to be checked prior to this visit -- delayed 2/2 COVID-19 restrictions.  Labs/Other Tests and Data Reviewed:    EKG:  No ECG reviewed.  Recent Labs: 03/07/2018: ALT 24; Hemoglobin 13.6; Platelets 268 03/10/2018: BUN 7; Creatinine, Ser 0.75; Potassium 3.7; Sodium 134   Recent Lipid Panel Lab Results  Component Value Date/Time   CHOL 147 03/07/2018 08:12 AM   TRIG 107 03/07/2018 08:12 AM   HDL 38 (L) 03/07/2018 08:12 AM   CHOLHDL 3.9 03/07/2018 08:12 AM   LDLCALC 88 03/07/2018 08:12 AM    Wt Readings from Last 3 Encounters:  06/25/18 145 lb (65.8 kg)  03/26/18 145 lb 6.4 oz (66 kg)  03/09/18 134 lb 4.2 oz (60.9 kg)     Objective:    Vital Signs:  Ht 6' (1.829 m)   Wt 145 lb (65.8 kg)   BMI 19.67 kg/m   (Sister checks BP every ~2 weeks -- has been "doing good" Well nourished, well developed male in no acute distress. A&O x 3.  normal Mood & Affect Non-labored respirations   ASSESSMENT & PLAN:    Problem List Items Addressed This Visit    Coronary artery disease involving native coronary artery of native heart with angina pectoris ([place]) - Primary (Chronic)    Delayed presentation of anterior MI with DES PCI to the LAD and moderate residual disease elsewhere.  Thankfully no further angina symptoms, but I think he may have had a pretty significant anterior wall infarct.  Plan:  Uninterrupted Brilinta for 1 year, continue aspirin for now as long as no significant bleeding.  After 1 year (as of January 2021) we will reduce to maintenance dose Brilinta or convert to Plavix.  Would likely continue that lifelong, but would be okay to stop for procedures at that time.  Continue current dose of beta-blocker and ACE inhibitor (would like to see what his blood pressures are  running, but his sister says that the running fine).  High-dose-Statin  Continue to exercise.  Gradually increased amount of exercise.  Should be ready to return to full  work      Relevant Medications   carvedilol (COREG) 12.5 MG tablet   Essential hypertension (Chronic)    Per his report, blood pressures have been stable on carvedilol and lisinopril.  I do not think his dizzy spells are related to his ACE inhibitor or blood pressure, probably more related to transient vertigo type symptoms based on the description.  Were not very positional.      Relevant Medications   carvedilol (COREG) 12.5 MG tablet   Hyperlipidemia LDL goal <70 (Chronic)    Lipids look pretty good at the time of his MI.  He is due for follow-up labs now.   He is on high-dose atorvastatin, tolerating well  With COVID-19 restrictions wearing a delay in coming in for another month or so, but will have him come in for CMP, FLP and CBC (CBC because of being on antiplatelet agent)        Relevant Medications   carvedilol (COREG) 12.5 MG tablet   Ischemic cardiomyopathy (Chronic)    Thankfully his EF improved from 25-30% at the time of his cath up to 35 to 40% post PCI.  Some of this reduced EF may be related to left bundle branch block with septal bounce, but he still has a very significant anterior wall motion normality.  Plan was to recheck an echocardiogram about now, this was delayed because of COVID-19 restrictions.  We will make sure this is rescheduled to be performed within the next month or 2.  This will help [place] reassess what the final outcome will be.  I suspect it may have improved somewhat.  He needs to continue to stay active and exercise in order to maximize his cardiac function.  On stable dose of ACE inhibitor and carvedilol.  Because of heart rate in the 50s and 60s during last evaluation recommend push the carvedilol anymore.  With dizziness on reluctant to increase ACE inhibitor.  Thankfully is  not really having true heart failure symptoms and therefore not requiring diuretic.      Relevant Medications   carvedilol (COREG) 12.5 MG tablet   Sinus tachycardia (Chronic)    Was initially tachycardic at the time of his MI, now improved on carvedilol.  Part of his exercise intolerance may be related to carvedilol.  When we reevaluate in the fall we will see if potentially backing off carvedilol and increasing other antihypertensive agents may help.  With potential use of spironolactone for more blood pressure if we are having to back off  beta-blocker.      Tobacco abuse (Chronic)    He quit smoking within a month of returning home.  Very proud of himself.  Smoking cessation instruction/counseling given:  commended patient for quitting and reviewed strategies for preventing relapses       Other Visit Diagnoses    Medication management       Relevant Orders   Lipid panel   CBC   Comprehensive metabolic panel      XOVAN-19 Education: The signs and symptoms of COVID-19 were discussed with the patient and how to seek care for testing (follow up with PCP or arrange E-visit).   The importance of social distancing was discussed today.  Time:   Today, I have spent 20 minutes with the patient with telehealth technology discussing the above problems.     Medication Adjustments/Labs and Tests Ordered: Current medicines are reviewed at length with the patient today.  Concerns regarding medicines are outlined above.  Medication Instructions:  No changes  Tests Ordered: Orders Placed This Encounter  Procedures  . Lipid panel  . CBC  . Comprehensive metabolic panel  -- Labs & Echo need to be rescheduled.  Medication Changes: Meds ordered this encounter  Medications  . carvedilol (COREG) 12.5 MG tablet    Sig: Take 1 tablet (12.5 mg total) by mouth 2 (two) times daily with a meal.    Dispense:  180 tablet    Refill:  3    Disposition:  Follow up in Oct-Nov 2020.     Signed, Glenetta Hew, MD  06/25/2018 9:51 AM    [place]

## 2018-06-25 NOTE — Assessment & Plan Note (Signed)
Was initially tachycardic at the time of his MI, now improved on carvedilol.  Part of his exercise intolerance may be related to carvedilol.  When we reevaluate in the fall we will see if potentially backing off carvedilol and increasing other antihypertensive agents may help.  With potential use of spironolactone for more blood pressure if we are having to back off  beta-blocker.

## 2018-06-25 NOTE — Assessment & Plan Note (Signed)
He quit smoking within a month of returning home.  Very proud of himself.  Smoking cessation instruction/counseling given:  commended patient for quitting and reviewed strategies for preventing relapses

## 2018-06-25 NOTE — Assessment & Plan Note (Addendum)
Thankfully his EF improved from 25-30% at the time of his cath up to 35 to 40% post PCI.  Some of this reduced EF may be related to left bundle branch block with septal bounce, but he still has a very significant anterior wall motion normality.  Plan was to recheck an echocardiogram about now, this was delayed because of COVID-19 restrictions.  We will make sure this is rescheduled to be performed within the next month or 2.  This will help [place] reassess what the final outcome will be.  I suspect it may have improved somewhat.  He needs to continue to stay active and exercise in order to maximize his cardiac function.  On stable dose of ACE inhibitor and carvedilol.  Because of heart rate in the 50s and 60s during last evaluation recommend push the carvedilol anymore.  With dizziness on reluctant to increase ACE inhibitor.  Thankfully is not really having true heart failure symptoms and therefore not requiring diuretic.

## 2018-06-25 NOTE — Telephone Encounter (Signed)
New Message              Patient called back to give correct address. Patient states he was told that someone was mailing out paper work. Patient went from [place] to [employer]. Pls check before mailing

## 2018-06-25 NOTE — Assessment & Plan Note (Signed)
Per his report, blood pressures have been stable on carvedilol and lisinopril.  I do not think his dizzy spells are related to his ACE inhibitor or blood pressure, probably more related to transient vertigo type symptoms based on the description.  Were not very positional.

## 2018-06-29 NOTE — Telephone Encounter (Signed)
Noted. Thanks.

## 2018-06-29 NOTE — Telephone Encounter (Signed)
Patient called back and we do have the correct address on file

## 2018-06-29 NOTE — Telephone Encounter (Signed)
Left message to call back to confirm correct address. Left call back number.

## 2018-07-22 ENCOUNTER — Telehealth (HOSPITAL_COMMUNITY): Payer: Self-pay | Admitting: Radiology

## 2018-07-22 NOTE — Telephone Encounter (Signed)
Left a message to call office-needs to schedule Echocardiogram.

## 2018-07-27 ENCOUNTER — Telehealth (HOSPITAL_COMMUNITY): Payer: Self-pay | Admitting: *Deleted

## 2018-07-27 NOTE — Telephone Encounter (Signed)
Left message for patient to call office about appointment. (COVID 19)

## 2018-07-28 ENCOUNTER — Telehealth (HOSPITAL_COMMUNITY): Payer: Self-pay | Admitting: Cardiology

## 2018-07-28 NOTE — Telephone Encounter (Signed)

## 2018-07-29 ENCOUNTER — Other Ambulatory Visit (HOSPITAL_COMMUNITY): Payer: Medicaid Other

## 2018-08-13 ENCOUNTER — Telehealth (HOSPITAL_COMMUNITY): Payer: Self-pay | Admitting: *Deleted

## 2018-08-13 NOTE — Telephone Encounter (Signed)
COVID-19 Pre-Screening Questions:  . Do you currently have a fever? NO (yes = cancel and refer to pcp for e-visit) . Have you recently travelled on a cruise, internationally, or to [place], [place], [place], [place], [place], or [place], [place] ([place]) ? NO (yes = cancel, stay home, monitor symptoms, and contact pcp or initiate e-visit if symptoms develop) . Have you been in contact with someone that is currently pending confirmation of Covid19 testing or has been confirmed to have the Covid19 virus?  NO (yes = cancel, stay home, away from tested individual, monitor symptoms, and contact pcp or initiate e-visit if symptoms develop) . Are you currently experiencing fatigue or cough? NO (yes = pt should be prepared to have a mask placed at the time of their visit).   . Reiterated no additional visitors. . Arrive no earlier than 15 minutes before appointment time. . Please bring own mask.  Daniel Weiss 

## 2018-08-16 ENCOUNTER — Ambulatory Visit (HOSPITAL_COMMUNITY): Payer: Medicaid Other | Attending: Cardiology

## 2018-08-16 ENCOUNTER — Other Ambulatory Visit: Payer: Self-pay

## 2018-08-16 DIAGNOSIS — I255 Ischemic cardiomyopathy: Secondary | ICD-10-CM

## 2018-08-16 DIAGNOSIS — I25119 Atherosclerotic heart disease of native coronary artery with unspecified angina pectoris: Secondary | ICD-10-CM

## 2018-08-16 MED ORDER — PERFLUTREN LIPID MICROSPHERE
1.0000 mL | INTRAVENOUS | Status: AC | PRN
  Administered 2018-08-16: 2 mL via INTRAVENOUS

## 2018-08-25 ENCOUNTER — Telehealth: Payer: Self-pay | Admitting: *Deleted

## 2018-08-25 NOTE — Telephone Encounter (Addendum)
Left message to call back in regards to echo report also patient need to have lab work done from last office visit

## 2018-08-25 NOTE — Telephone Encounter (Signed)
-----   Message from [name], MD sent at 08/23/2018  3:47 PM EDT ----- Per Dr. Curt Bears - will monitor for now as EF still ~40%.   Follow-up.  Glenetta Hew, MD

## 2018-09-09 NOTE — Telephone Encounter (Signed)
Result given on 09/01/18

## 2018-09-19 ENCOUNTER — Encounter (HOSPITAL_BASED_OUTPATIENT_CLINIC_OR_DEPARTMENT_OTHER): Payer: Self-pay | Admitting: *Deleted

## 2018-09-19 ENCOUNTER — Other Ambulatory Visit: Payer: Self-pay

## 2018-09-19 ENCOUNTER — Emergency Department (HOSPITAL_BASED_OUTPATIENT_CLINIC_OR_DEPARTMENT_OTHER)
Admission: EM | Admit: 2018-09-19 | Discharge: 2018-09-19 | Discharge status: Against Medical Advice | Payer: Medicaid Other | Attending: Emergency Medicine | Admitting: Emergency Medicine

## 2018-09-19 ENCOUNTER — Emergency Department (HOSPITAL_BASED_OUTPATIENT_CLINIC_OR_DEPARTMENT_OTHER): Payer: Medicaid Other

## 2018-09-19 DIAGNOSIS — R55 Syncope and collapse: Secondary | ICD-10-CM | POA: Diagnosis present

## 2018-09-19 DIAGNOSIS — Z5321 Procedure and treatment not carried out due to patient leaving prior to being seen by health care provider: Secondary | ICD-10-CM | POA: Insufficient documentation

## 2018-09-19 LAB — BASIC METABOLIC PANEL
Anion gap: 12 (ref 5–15)
BUN: 6 mg/dL (ref 6–20)
CO2: 21 mmol/L — ABNORMAL LOW (ref 22–32)
Calcium: 8.4 mg/dL — ABNORMAL LOW (ref 8.9–10.3)
Chloride: 106 mmol/L (ref 98–111)
Creatinine, Ser: 0.81 mg/dL (ref 0.61–1.24)
GFR calc Af Amer: >60 mL/min (ref >60)
GFR calc non Af Amer: >60 mL/min (ref >60)
Glucose, Bld: 111 mg/dL — ABNORMAL HIGH (ref 70–99)
Potassium: 3.6 mmol/L (ref 3.5–5.1)
Sodium: 139 mmol/L (ref 135–145)

## 2018-09-19 LAB — HEPATIC FUNCTION PANEL
ALT: 18 U/L (ref 0–44)
AST: 30 U/L (ref 15–41)
Albumin: 3.2 g/dL — ABNORMAL LOW (ref 3.5–5.0)
Alkaline Phosphatase: 51 U/L (ref 38–126)
Bilirubin, Direct: 0.1 mg/dL (ref 0.0–0.2)
Indirect Bilirubin: 0.9 mg/dL (ref 0.3–0.9)
Total Bilirubin: 1 mg/dL (ref 0.3–1.2)
Total Protein: 6.3 g/dL — ABNORMAL LOW (ref 6.5–8.1)

## 2018-09-19 LAB — CBC
HCT: 36.3 % — ABNORMAL LOW (ref 39.0–52.0)
Hemoglobin: 12.2 g/dL — ABNORMAL LOW (ref 13.0–17.0)
MCH: 27.5 pg (ref 26.0–34.0)
MCHC: 33.6 g/dL (ref 30.0–36.0)
MCV: 81.9 fL (ref 80.0–100.0)
Platelets: 298 10*3/uL (ref 150–400)
RBC: 4.43 MIL/uL (ref 4.22–5.81)
RDW: 18.7 % — ABNORMAL HIGH (ref 11.5–15.5)
WBC: 6.2 10*3/uL (ref 4.0–10.5)
nRBC: 0 % (ref 0.0–0.2)

## 2018-09-19 LAB — TROPONIN I (HIGH SENSITIVITY): Troponin I (High Sensitivity): 19 ng/L — ABNORMAL HIGH (ref <18)

## 2018-09-19 MED ORDER — SODIUM CHLORIDE 0.9% FLUSH
3.0000 mL | Freq: Once | INTRAVENOUS | Status: DC
  Filled 2018-09-19: qty 3

## 2018-09-19 NOTE — ED Triage Notes (Signed)
Syncope episode today after being outside. Denies pain at this time.

## 2018-09-19 NOTE — ED Notes (Signed)
Pt was called from waiting area and roomed. Staff went to check on patient and he was not in his room. Pt was outside getting into a ride. RN talked to patient and he stated that he was leaving. IV removed and EDP notified ([place]).

## 2018-09-19 NOTE — ED Notes (Signed)
Pt not found in room.

## 2018-10-09 HISTORY — PX: TRANSTHORACIC ECHOCARDIOGRAM: SHX275

## 2018-11-03 ENCOUNTER — Emergency Department (HOSPITAL_COMMUNITY): Payer: Medicaid Other

## 2018-11-03 ENCOUNTER — Emergency Department (HOSPITAL_BASED_OUTPATIENT_CLINIC_OR_DEPARTMENT_OTHER): Payer: Medicaid Other

## 2018-11-03 ENCOUNTER — Inpatient Hospital Stay (HOSPITAL_BASED_OUTPATIENT_CLINIC_OR_DEPARTMENT_OTHER)
Admission: EM | Admit: 2018-11-03 | Discharge: 2018-11-12 | DRG: 065 | Disposition: A | Discharge status: Home / Self Care | Payer: Medicaid Other | Attending: Internal Medicine | Admitting: Internal Medicine

## 2018-11-03 ENCOUNTER — Encounter (HOSPITAL_BASED_OUTPATIENT_CLINIC_OR_DEPARTMENT_OTHER): Payer: Self-pay | Admitting: Emergency Medicine

## 2018-11-03 ENCOUNTER — Other Ambulatory Visit: Payer: Self-pay

## 2018-11-03 DIAGNOSIS — Z8673 Personal history of transient ischemic attack (TIA), and cerebral infarction without residual deficits: Secondary | ICD-10-CM

## 2018-11-03 DIAGNOSIS — I110 Hypertensive heart disease with heart failure: Secondary | ICD-10-CM | POA: Diagnosis present

## 2018-11-03 DIAGNOSIS — Z681 Body mass index (BMI) 19 or less, adult: Secondary | ICD-10-CM

## 2018-11-03 DIAGNOSIS — Z720 Tobacco use: Secondary | ICD-10-CM | POA: Diagnosis present

## 2018-11-03 DIAGNOSIS — Z8249 Family history of ischemic heart disease and other diseases of the circulatory system: Secondary | ICD-10-CM

## 2018-11-03 DIAGNOSIS — I255 Ischemic cardiomyopathy: Secondary | ICD-10-CM | POA: Diagnosis present

## 2018-11-03 DIAGNOSIS — C7951 Secondary malignant neoplasm of bone: Secondary | ICD-10-CM | POA: Diagnosis present

## 2018-11-03 DIAGNOSIS — I447 Left bundle-branch block, unspecified: Secondary | ICD-10-CM | POA: Diagnosis not present

## 2018-11-03 DIAGNOSIS — D110 Benign neoplasm of parotid gland: Secondary | ICD-10-CM | POA: Diagnosis not present

## 2018-11-03 DIAGNOSIS — Z803 Family history of malignant neoplasm of breast: Secondary | ICD-10-CM

## 2018-11-03 DIAGNOSIS — I6340 Cerebral infarction due to embolism of unspecified cerebral artery: Principal | ICD-10-CM | POA: Diagnosis present

## 2018-11-03 DIAGNOSIS — I7410 Embolism and thrombosis of unspecified parts of aorta: Secondary | ICD-10-CM | POA: Diagnosis not present

## 2018-11-03 DIAGNOSIS — F17210 Nicotine dependence, cigarettes, uncomplicated: Secondary | ICD-10-CM | POA: Diagnosis present

## 2018-11-03 DIAGNOSIS — Z833 Family history of diabetes mellitus: Secondary | ICD-10-CM

## 2018-11-03 DIAGNOSIS — I7411 Embolism and thrombosis of thoracic aorta: Secondary | ICD-10-CM | POA: Diagnosis present

## 2018-11-03 DIAGNOSIS — I10 Essential (primary) hypertension: Secondary | ICD-10-CM | POA: Diagnosis present

## 2018-11-03 DIAGNOSIS — N401 Enlarged prostate with lower urinary tract symptoms: Secondary | ICD-10-CM | POA: Diagnosis not present

## 2018-11-03 DIAGNOSIS — I639 Cerebral infarction, unspecified: Secondary | ICD-10-CM

## 2018-11-03 DIAGNOSIS — R29810 Facial weakness: Secondary | ICD-10-CM | POA: Diagnosis present

## 2018-11-03 DIAGNOSIS — I5042 Chronic combined systolic (congestive) and diastolic (congestive) heart failure: Secondary | ICD-10-CM | POA: Diagnosis present

## 2018-11-03 DIAGNOSIS — Z806 Family history of leukemia: Secondary | ICD-10-CM

## 2018-11-03 DIAGNOSIS — Z20828 Contact with and (suspected) exposure to other viral communicable diseases: Secondary | ICD-10-CM | POA: Diagnosis present

## 2018-11-03 DIAGNOSIS — I252 Old myocardial infarction: Secondary | ICD-10-CM

## 2018-11-03 DIAGNOSIS — I25119 Atherosclerotic heart disease of native coronary artery with unspecified angina pectoris: Secondary | ICD-10-CM | POA: Diagnosis present

## 2018-11-03 DIAGNOSIS — E876 Hypokalemia: Secondary | ICD-10-CM | POA: Diagnosis not present

## 2018-11-03 DIAGNOSIS — E440 Moderate protein-calorie malnutrition: Secondary | ICD-10-CM | POA: Diagnosis present

## 2018-11-03 DIAGNOSIS — R200 Anesthesia of skin: Secondary | ICD-10-CM | POA: Diagnosis present

## 2018-11-03 DIAGNOSIS — R29701 NIHSS score 1: Secondary | ICD-10-CM | POA: Diagnosis present

## 2018-11-03 DIAGNOSIS — I340 Nonrheumatic mitral (valve) insufficiency: Secondary | ICD-10-CM | POA: Diagnosis not present

## 2018-11-03 DIAGNOSIS — I513 Intracardiac thrombosis, not elsewhere classified: Secondary | ICD-10-CM | POA: Diagnosis present

## 2018-11-03 DIAGNOSIS — N400 Enlarged prostate without lower urinary tract symptoms: Secondary | ICD-10-CM | POA: Diagnosis not present

## 2018-11-03 DIAGNOSIS — E785 Hyperlipidemia, unspecified: Secondary | ICD-10-CM | POA: Diagnosis present

## 2018-11-03 DIAGNOSIS — Z823 Family history of stroke: Secondary | ICD-10-CM

## 2018-11-03 DIAGNOSIS — R29702 NIHSS score 2: Secondary | ICD-10-CM | POA: Diagnosis not present

## 2018-11-03 DIAGNOSIS — I2510 Atherosclerotic heart disease of native coronary artery without angina pectoris: Secondary | ICD-10-CM | POA: Diagnosis not present

## 2018-11-03 DIAGNOSIS — R3912 Poor urinary stream: Secondary | ICD-10-CM | POA: Diagnosis not present

## 2018-11-03 DIAGNOSIS — R202 Paresthesia of skin: Secondary | ICD-10-CM | POA: Diagnosis not present

## 2018-11-03 DIAGNOSIS — C61 Malignant neoplasm of prostate: Secondary | ICD-10-CM | POA: Diagnosis present

## 2018-11-03 DIAGNOSIS — E871 Hypo-osmolality and hyponatremia: Secondary | ICD-10-CM | POA: Diagnosis present

## 2018-11-03 DIAGNOSIS — Z955 Presence of coronary angioplasty implant and graft: Secondary | ICD-10-CM

## 2018-11-03 DIAGNOSIS — Z79899 Other long term (current) drug therapy: Secondary | ICD-10-CM

## 2018-11-03 DIAGNOSIS — Z7902 Long term (current) use of antithrombotics/antiplatelets: Secondary | ICD-10-CM

## 2018-11-03 DIAGNOSIS — Z7982 Long term (current) use of aspirin: Secondary | ICD-10-CM

## 2018-11-03 HISTORY — DX: Cerebral infarction, unspecified: I63.9

## 2018-11-03 HISTORY — DX: Acute myocardial infarction, unspecified: I21.9

## 2018-11-03 LAB — CBC
HCT: 32.6 % — ABNORMAL LOW (ref 39.0–52.0)
Hemoglobin: 10.6 g/dL — ABNORMAL LOW (ref 13.0–17.0)
MCH: 26.4 pg (ref 26.0–34.0)
MCHC: 32.5 g/dL (ref 30.0–36.0)
MCV: 81.1 fL (ref 80.0–100.0)
Platelets: 467 10*3/uL — ABNORMAL HIGH (ref 150–400)
RBC: 4.02 MIL/uL — ABNORMAL LOW (ref 4.22–5.81)
RDW: 16.8 % — ABNORMAL HIGH (ref 11.5–15.5)
WBC: 8 10*3/uL (ref 4.0–10.5)
nRBC: 0 % (ref 0.0–0.2)

## 2018-11-03 LAB — DIFFERENTIAL
Abs Immature Granulocytes: 0.03 10*3/uL (ref 0.00–0.07)
Basophils Absolute: 0 10*3/uL (ref 0.0–0.1)
Basophils Relative: 1 %
Eosinophils Absolute: 0 10*3/uL (ref 0.0–0.5)
Eosinophils Relative: 0 %
Immature Granulocytes: 0 %
Lymphocytes Relative: 26 %
Lymphs Abs: 2.1 10*3/uL (ref 0.7–4.0)
Monocytes Absolute: 1.2 10*3/uL — ABNORMAL HIGH (ref 0.1–1.0)
Monocytes Relative: 15 %
Neutro Abs: 4.7 10*3/uL (ref 1.7–7.7)
Neutrophils Relative %: 58 %

## 2018-11-03 LAB — URINALYSIS, ROUTINE W REFLEX MICROSCOPIC
Bilirubin Urine: NEGATIVE
Glucose, UA: NEGATIVE mg/dL
Ketones, ur: NEGATIVE mg/dL
Leukocytes,Ua: NEGATIVE
Nitrite: NEGATIVE
Protein, ur: NEGATIVE mg/dL
Specific Gravity, Urine: <1.005 — ABNORMAL LOW (ref 1.005–1.030)
pH: 6.5 (ref 5.0–8.0)

## 2018-11-03 LAB — COMPREHENSIVE METABOLIC PANEL
ALT: 9 U/L (ref 0–44)
AST: 16 U/L (ref 15–41)
Albumin: 2.9 g/dL — ABNORMAL LOW (ref 3.5–5.0)
Alkaline Phosphatase: 84 U/L (ref 38–126)
Anion gap: 10 (ref 5–15)
BUN: 5 mg/dL — ABNORMAL LOW (ref 6–20)
CO2: 24 mmol/L (ref 22–32)
Calcium: 8.6 mg/dL — ABNORMAL LOW (ref 8.9–10.3)
Chloride: 100 mmol/L (ref 98–111)
Creatinine, Ser: 0.77 mg/dL (ref 0.61–1.24)
GFR calc Af Amer: >60 mL/min (ref >60)
GFR calc non Af Amer: >60 mL/min (ref >60)
Glucose, Bld: 107 mg/dL — ABNORMAL HIGH (ref 70–99)
Potassium: 3.8 mmol/L (ref 3.5–5.1)
Sodium: 134 mmol/L — ABNORMAL LOW (ref 135–145)
Total Bilirubin: 0.4 mg/dL (ref 0.3–1.2)
Total Protein: 6.6 g/dL (ref 6.5–8.1)

## 2018-11-03 LAB — CBG MONITORING, ED: Glucose-Capillary: 114 mg/dL — ABNORMAL HIGH (ref 70–99)

## 2018-11-03 LAB — URINALYSIS, MICROSCOPIC (REFLEX)

## 2018-11-03 LAB — RAPID URINE DRUG SCREEN, HOSP PERFORMED
Amphetamines: NOT DETECTED
Barbiturates: NOT DETECTED
Benzodiazepines: NOT DETECTED
Cocaine: NOT DETECTED
Opiates: NOT DETECTED
Tetrahydrocannabinol: POSITIVE — AB

## 2018-11-03 LAB — TROPONIN I (HIGH SENSITIVITY)
Troponin I (High Sensitivity): 53 ng/L — ABNORMAL HIGH (ref <18)
Troponin I (High Sensitivity): 46 ng/L — ABNORMAL HIGH (ref <18)
Troponin I (High Sensitivity): 73 ng/L — ABNORMAL HIGH (ref <18)
Troponin I (High Sensitivity): 67 ng/L — ABNORMAL HIGH (ref <18)

## 2018-11-03 LAB — APTT: aPTT: 27 seconds (ref 24–36)

## 2018-11-03 LAB — PROTIME-INR
INR: 1 (ref 0.8–1.2)
Prothrombin Time: 13 seconds (ref 11.4–15.2)

## 2018-11-03 LAB — PSA: Prostatic Specific Antigen: 502 ng/mL — ABNORMAL HIGH (ref 0.00–4.00)

## 2018-11-03 LAB — ETHANOL

## 2018-11-03 LAB — SARS CORONAVIRUS 2 (TAT 6-24 HRS): SARS Coronavirus 2: NEGATIVE

## 2018-11-03 MED ORDER — NICOTINE 21 MG/24HR TD PT24
21.0000 mg | MEDICATED_PATCH | Freq: Every day | TRANSDERMAL | Status: DC
  Administered 2018-11-04 – 2018-11-12 (×9): 21 mg via TRANSDERMAL
  Filled 2018-11-03 (×9): qty 1

## 2018-11-03 MED ORDER — IOHEXOL 350 MG/ML SOLN
100.0000 mL | Freq: Once | INTRAVENOUS | Status: AC | PRN
  Administered 2018-11-03: 100 mL via INTRAVENOUS

## 2018-11-03 MED ORDER — ASPIRIN 81 MG PO CHEW
324.0000 mg | CHEWABLE_TABLET | Freq: Once | ORAL | Status: AC
  Administered 2018-11-03: 324 mg via ORAL
  Filled 2018-11-03: qty 4

## 2018-11-03 MED ORDER — HEPARIN (PORCINE) 25000 UT/250ML-% IV SOLN
1350.0000 [IU]/h | INTRAVENOUS | Status: AC
  Administered 2018-11-03: 21:00:00 750 [IU]/h via INTRAVENOUS
  Administered 2018-11-04: 1200 [IU]/h via INTRAVENOUS
  Administered 2018-11-05: 1600 [IU]/h via INTRAVENOUS
  Administered 2018-11-05: 1450 [IU]/h via INTRAVENOUS
  Administered 2018-11-06 – 2018-11-08 (×4): 1500 [IU]/h via INTRAVENOUS
  Administered 2018-11-10 – 2018-11-11 (×2): 1400 [IU]/h via INTRAVENOUS
  Administered 2018-11-11: 20:00:00 1350 [IU]/h via INTRAVENOUS
  Filled 2018-11-03 (×11): qty 250

## 2018-11-03 MED ORDER — ALTEPLASE 100 MG IV SOLR
INTRAVENOUS | Status: AC
  Filled 2018-11-03: qty 1

## 2018-11-03 MED ORDER — ATORVASTATIN CALCIUM 80 MG PO TABS
80.0000 mg | ORAL_TABLET | Freq: Once | ORAL | Status: DC
  Filled 2018-11-03: qty 1

## 2018-11-03 MED ORDER — IOHEXOL 350 MG/ML SOLN
100.0000 mL | Freq: Once | INTRAVENOUS | Status: AC | PRN
  Administered 2018-11-03: 13:00:00 100 mL via INTRAVENOUS

## 2018-11-03 NOTE — Consult Note (Signed)
[facility] Consult Services   Date of Service:   11/03/2018 11:28:57  Impression:     .  Right Hemispheric Infarct  Comments/Sign-Out: Minor deficit, resolving, tPA offered, patient declined, bleeding risk not warranted sue to minor deficit. CTA recommended. ASA and atorvastatin now. Neuro cehcks q61min until 4.5h window closes. If any deterioration re-activate stroke alert. Remains eligible for IV tPA until 15:15  Metrics: Last Known Well: 11/03/2018 10:45:00 [facility] Notification Time: 11/03/2018 11:28:11 Arrival Time: 11/03/2018 11:12:20 Stamp Time: 11/03/2018 11:28:57 Time First Login Attempt: 11/03/2018 11:31:43 Video Start Time: 11/03/2018 11:32:30  Symptoms: left arm numb NIHSS Start Assessment Time: 11/03/2018 11:37:31 Patient is not a candidate for tPA. Patient was not deemed candidate for tPA thrombolytics because of Patient refused tPA. Weight Noted by Staff: 59.4 kg Video End Time: 11/03/2018 11:49:27  CT head showed no acute hemorrhage or acute core infarct.  Lower Likelihood of Large Vessel Occlusion but Following Stat Studies are Recommended  CTA Head and Neck.   ED Physician notified of diagnostic impression and management plan on 11/03/2018 11:47:22  Our recommendations are outlined below.  Recommendations:     .  Activate Stroke Protocol Admission/Order Set     .  Stroke/Telemetry Floor     .  Neuro Checks     .  Bedside Swallow Eval     .  DVT Prophylaxis     .  IV Fluids, Normal Saline     .  Head of Bed 30 Degrees     .  Euglycemia and Avoid Hyperthermia (PRN Acetaminophen)     .  Initiate Aspirin 325 MG Daily     .  Atorvastatin 80mg   Routine Consultation with [facility] for Follow up Care  Sign Out:     .  Discussed with Emergency Department Provider    ------------------------------------------------------------------------------  History of Present Illness: Patient is a 58 year old  Male. Right handed.  Patient was brought by private transportation with symptoms of left arm numb  Chest pain at 3AM, took nitro, resolved. At 10:45AM left arm numbness. Prior stroke and MI, smoker. Better but still ongoing. Dull headache. No neck pain. Chest pain resolved. Active smoker. Baseline independent ADLs. tPA offered patient declined.   Past Medical History:     . Hypertension     . Hyperlipidemia     . Coronary Artery Disease  Anticoagulant use:  No  Antiplatelet use: asa and ticagrelor  Examination: BP(156/105), Blood Glucose(114) 1A: Level of Consciousness - Alert; keenly responsive + 0 1B: Ask Month and Age - Both Questions Right + 0 1C: Blink Eyes & Squeeze Hands - Performs Both Tasks + 0 2: Test Horizontal Extraocular Movements - Normal + 0 3: Test Visual Fields - No Visual Loss + 0 4: Test Facial Palsy (Use Grimace if Obtunded) - Minor paralysis (flat nasolabial fold, smile asymmetry) + 1 5A: Test Left Arm Motor Drift - No Drift for 10 Seconds + 0 5B: Test Right Arm Motor Drift - No Drift for 10 Seconds + 0 6A: Test Left Leg Motor Drift - No Drift for 5 Seconds + 0 6B: Test Right Leg Motor Drift - No Drift for 5 Seconds + 0 7: Test Limb Ataxia (FNF/Heel-Shin) - No Ataxia + 0 8: Test Sensation - Mild-Moderate Loss: Less Sharp/More Dull + 1 9: Test Language/Aphasia - Normal; No aphasia + 0 10: Test Dysarthria - Normal + 0 11: Test Extinction/Inattention - No abnormality + 0  NIHSS Score: 2  Patient/Family was informed the Neurology Consult would happen via TeleHealth consult by way of interactive audio and video telecommunications and consented to receiving care in this manner.   Due to the immediate potential for life-threatening deterioration due to underlying acute neurologic illness, I spent 35 minutes providing critical care. This time includes time for face to face visit via telemedicine, review of medical records, imaging studies and discussion of findings  with providers, the patient and/or family.   Dr Lita Mains   [facility] [phone]   Case [id number]

## 2018-11-03 NOTE — ED Provider Notes (Signed)
[facility] EMERGENCY DEPARTMENT Provider Note   CSN: [id number] Arrival date & time: 11/03/18  1112     History   Chief Complaint Chief Complaint  Patient presents with   Chest Pain   Numbness    HPI Daniel Weiss is a 58 y.o. male.  Transfer ER to ER from [place].  Patient presented to the emergency department originally with sewed of left arm tingling, weakness as well as episodes of chest pain.  First episode was around 3:30 AM noted some chest pain, took 1 nitroglycerin away.  Went to work and was asymptomatic at that time.  Around 10:45 AM no he noted sudden onset of left arm numbness, heaviness and some weakness.  Since initial onset symptoms have slowly been waning waxing, currently denies any weakness or heaviness but still is having some tingling sensation.  No active chest pain.  Patient underwent CT angios chest, abdomen pelvis, head and neck.  Concern for stroke, possible dissection.  Case discussed with tele-neurology as well as CT surgery.  Per Dr. Regenia Skeeter, "Questionable early dissection versus thrombus in his aorta."       HPI  Past Medical History:  Diagnosis Date   Abnormal chest CT    a. Ectatic 3 cm Ao arch - rec f/u outpt imaging w/ CTA or MRA. Ectatic atheromatous abd Ao @ risk for aneurysm - rec f/u u/s in 5 yrs. Marked prostatic enlargement w/ scattered small sclerotic foci in the thoracic lower lumbar spine, sacrum, left 12th rib, pelvis, and right femur.  Recommend elective outpatient whole-body bone scintigraphy and PSA.   CAD (coronary artery disease)    a. 02/2018 ACS/PCI: LM nl, LAD 85p (3.5x15 Sierra DES), D1 45ost, RI 55ost, RCA nl, EF 25-35%.   Complete left bundle branch block (LBBB) 2016   Current every day smoker    HFrEF (heart failure with reduced ejection fraction) ([place])    Hypertension    Ischemic cardiomyopathy 02/2018   a. 02/2018 Echo: EF 35-40% (LV gram on cath was 25-30%), mod, diff HK.  mid-apicalanteroseptal, ant, and apical sev HK. RVH.   TIA (transient ischemic attack) 2016   Warthin's tumor     Patient Active Problem List   Diagnosis Date Noted   Presence of drug coated stent in LAD coronary artery 03/26/2018   Complete left bundle branch block (LBBB) 03/26/2018   Coronary artery disease involving native coronary artery of native heart with angina pectoris ([place]) 03/10/2018   Hyperlipidemia LDL goal <70 03/10/2018   Enlarged prostate 03/10/2018   Abnormal CT scan 03/10/2018   Ischemic cardiomyopathy 03/10/2018   Hypokalemia    Tobacco abuse    Non-ST elevation (NSTEMI) myocardial infarction ([place]) 03/06/2018   Postmyocardial infarction angina as complication of acute myocardial infarction ([place]) 03/06/2018   Sinus tachycardia 03/06/2018   Encephalopathy, hypertensive    ARF (acute renal failure) ([place]) 06/29/2015   Hyponatremia 06/29/2015   Dehydration 06/29/2015   Prolonged QT interval 06/29/2015   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   H/O medication noncompliance    Other headache syndrome    Localized swelling, mass or lump of neck    Neck mass    History of TIA (transient ischemic attack)    Paresthesia 07/07/2014   Essential hypertension 07/07/2014   Right sided weakness 07/07/2014   Mass of right side of neck 07/07/2014    Past Surgical History:  Procedure Laterality Date   CORONARY STENT INTERVENTION N/A 03/06/2018   Procedure: CORONARY STENT INTERVENTION;  Surgeon: [name], MD;  Location: [place] [facility];  Service: Cardiovascular: Proximal LAD 85% stenosis (and D1): DES PCI with Xience Sierra DES 3.5 mm x 15 mm - 3.9 mm   INTRAOPERATIVE TRANSTHORACIC ECHOCARDIOGRAM  03/06/2018   (Peri-MI) mild reduced EF 35-40%.  Diffuse hypokinesis (severe HK of the mid-apical anteroseptal, anterior and apical myocardium consistent with LAD infarct).  Unable to assess diastolic function.  Paradoxical septal motion likely related to  his LBBB.  RV hypertrophy noted.  Trivial pericardial effusion noted.    LEFT HEART CATH AND CORONARY ANGIOGRAPHY N/A 03/06/2018   Procedure: LEFT HEART CATH AND CORONARY ANGIOGRAPHY;  Surgeon: [name], MD;  Location: [place] [facility];  Service: Cardiovascular: Proximal LAD 85% involving D1 45%.  Ostial RI 55%.  Severe LV dysfunction EF 25 to 30%.  1+ MR.  Only minimally elevated LVEDP.        Home Medications    Prior to Admission medications   Medication Sig Start Date End Date Taking? Authorizing Provider  acetaminophen (TYLENOL) 325 MG tablet Take 2 tablets (650 mg total) by mouth every 4 (four) hours as needed for mild pain (temperature >/= 99.5 F). 07/08/14   Ahmed, Chesley Mires, MD  aspirin EC 81 MG EC tablet Take 1 tablet (81 mg total) by mouth daily. 07/08/14   Dellia Nims, MD  atorvastatin (LIPITOR) 80 MG tablet Take 1 tablet (80 mg total) by mouth daily. 03/10/18 10/06/18  Theora Gianotti, NP  carvedilol (COREG) 12.5 MG tablet Take 1 tablet (12.5 mg total) by mouth 2 (two) times daily with a meal. 06/25/18   [name], MD  lisinopril (PRINIVIL,ZESTRIL) 10 MG tablet Take 1 tablet (10 mg total) by mouth daily. 03/26/18 06/25/18  [name], MD  nitroGLYCERIN (NITROSTAT) 0.4 MG SL tablet Place 1 tablet (0.4 mg total) under the tongue every 5 (five) minutes as needed for chest pain. 03/10/18   Theora Gianotti, NP  ticagrelor (BRILINTA) 90 MG TABS tablet Take 1 tablet (90 mg total) by mouth 2 (two) times daily. 03/10/18   Theora Gianotti, NP    Family History Family History  Problem Relation Age of Onset   Stroke Mother    Hypertension Mother    Diabetes type II Mother    Leukemia Other    Breast cancer Other    CAD Neg Hx     Social History Social History   Tobacco Use   Smoking status: Current Every Day Smoker    Packs/day: 0.50    Types: Cigarettes   Smokeless tobacco: Former Systems developer    Quit date: 02/23/2018  Substance Use  Topics   Alcohol use: Yes    Alcohol/week: 2.0 standard drinks    Types: 2 Cans of beer per week    Comment: 2 Beers daily.   Drug use: Yes    Frequency: 3.0 times per week    Types: Marijuana     Allergies   Patient has no known allergies.   Review of Systems Review of Systems  Constitutional: Negative for chills and fever.  HENT: Negative for ear pain and sore throat.   Eyes: Negative for pain and visual disturbance.  Respiratory: Negative for cough and shortness of breath.   Cardiovascular: Positive for chest pain. Negative for palpitations.  Gastrointestinal: Negative for abdominal pain and vomiting.  Genitourinary: Negative for dysuria and hematuria.  Musculoskeletal: Negative for arthralgias and back pain.  Skin: Negative for color change and rash.  Neurological:  Positive for weakness and numbness. Negative for seizures and syncope.  All other systems reviewed and are negative.    Physical Exam Updated Vital Signs BP (!) 167/107 (BP Location: Right Arm)    Pulse 78    Temp 99 F (37.2 C) (Oral)    Resp 17    Wt 59.4 kg    SpO2 99%    BMI 17.28 kg/m   Physical Exam Vitals signs and nursing note reviewed.  Constitutional:      Appearance: He is well-developed.  HENT:     Head: Normocephalic and atraumatic.  Eyes:     Conjunctiva/sclera: Conjunctivae normal.  Neck:     Musculoskeletal: Neck supple.  Cardiovascular:     Rate and Rhythm: Normal rate and regular rhythm.     Heart sounds: No murmur.  Pulmonary:     Effort: Pulmonary effort is normal. No respiratory distress.     Breath sounds: Normal breath sounds.  Abdominal:     Palpations: Abdomen is soft.     Tenderness: There is no abdominal tenderness.  Skin:    General: Skin is warm and dry.  Neurological:     Mental Status: He is alert.     Comments: Alert noted x3, cranial nerves II through XII intact, 5 out of 5 strength in bilateral upper and lower extremities, no pronator drift, sensation to  light touch intact throughout all 4 extremities      ED Treatments / Results  Labs (all labs ordered are listed, but only abnormal results are displayed) Labs Reviewed  CBC - Abnormal; Notable for the following components:      Result Value   RBC 4.02 (*)    Hemoglobin 10.6 (*)    HCT 32.6 (*)    RDW 16.8 (*)    Platelets 467 (*)    All other components within normal limits  DIFFERENTIAL - Abnormal; Notable for the following components:   Monocytes Absolute 1.2 (*)    All other components within normal limits  COMPREHENSIVE METABOLIC PANEL - Abnormal; Notable for the following components:   Sodium 134 (*)    Glucose, Bld 107 (*)    BUN 5 (*)    Calcium 8.6 (*)    Albumin 2.9 (*)    All other components within normal limits  RAPID URINE DRUG SCREEN, HOSP PERFORMED - Abnormal; Notable for the following components:   Tetrahydrocannabinol POSITIVE (*)    All other components within normal limits  URINALYSIS, ROUTINE W REFLEX MICROSCOPIC - Abnormal; Notable for the following components:   Specific Gravity, Urine <1.005 (*)    Hgb urine dipstick TRACE (*)    All other components within normal limits  URINALYSIS, MICROSCOPIC (REFLEX) - Abnormal; Notable for the following components:   Bacteria, UA RARE (*)    All other components within normal limits  CBG MONITORING, ED - Abnormal; Notable for the following components:   Glucose-Capillary 114 (*)    All other components within normal limits  TROPONIN I (HIGH SENSITIVITY) - Abnormal; Notable for the following components:   Troponin I (High Sensitivity) 53 (*)    All other components within normal limits  TROPONIN I (HIGH SENSITIVITY) - Abnormal; Notable for the following components:   Troponin I (High Sensitivity) 46 (*)    All other components within normal limits  SARS CORONAVIRUS 2 (TAT 6-12 HRS)  ETHANOL  PROTIME-INR  APTT  PSA    EKG EKG Interpretation  Date/Time:  Wednesday November 03 2018 11:20:43  EDT  Ventricular Rate:  80 PR Interval:    QRS Duration: 139 QT Interval:  353 QTC Calculation: 408 R Axis:   73 Text Interpretation:  Sinus rhythm LAE, consider biatrial enlargement IVCD, consider atypical LBBB no significant change since July 2020 Confirmed by Sherwood Gambler [phone]) on 11/03/2018 11:39:08 AM   Radiology Ct Angio Head W Or Wo Contrast  Result Date: 11/03/2018 CLINICAL DATA:  Acute onset of chest pain today. Numbness of the left arm and hand. EXAM: CT ANGIOGRAPHY HEAD AND NECK TECHNIQUE: Multidetector CT imaging of the head and neck was performed using the standard protocol during bolus administration of intravenous contrast. Multiplanar CT image reconstructions and MIPs were obtained to evaluate the vascular anatomy. Carotid stenosis measurements (when applicable) are obtained utilizing NASCET criteria, using the distal internal carotid diameter as the denominator. CONTRAST:  1110mL OMNIPAQUE IOHEXOL 350 MG/ML SOLN COMPARISON:  Head CT earlier same day FINDINGS: CTA NECK FINDINGS Aortic arch: Arch is negative. Branching pattern is normal without stenosis. Right carotid system: Common carotid artery widely patent to the bifurcation. No carotid bifurcation stenosis or irregularity. Minimal plaque in the ICA bulb. Left carotid system: Common carotid artery widely patent to the bifurcation. Mild atherosclerotic plaque at the carotid bifurcation and proximal external carotid. No internal carotid stenosis or irregularity. Vertebral arteries: Both vertebral artery origins are patent. Mild atherosclerotic plaque on the right but no flow limiting stenosis. On the left, there is 50% stenosis due to focal calcified plaque. Both vertebral arteries are patent through the cervical region to the foramen magnum. Skeleton: Degenerative cervical spondylosis. Prominent osteophytes/disc herniations at C4-5 and C5-6 that could cause symptomatic stenosis of the canal and or foramina. Other neck: Large right  parotid mass involving both the deep lobe and superficial lobe extending inferior from the superficial lobe. This has been seen on multiple previous examinations and is not changing, consistent with benign nature. Axial measurements are unchanged since 2017 at 32 x 43 mm. Upper chest: Negative Review of the MIP images confirms the above findings CTA HEAD FINDINGS Anterior circulation: Both internal carotid arteries are patent through the skull base and siphon regions. The anterior and middle cerebral vessels are patent without proximal stenosis, aneurysm or vascular malformation. No embolic occlusions are identified. Posterior circulation: Both vertebral arteries are widely patent through the foramen magnum to the basilar. The right is dominant. No basilar stenosis. Posterior circulation branch vessels are patent. Patent posterior communicating arteries on each side. Venous sinuses: Patent and normal. Anatomic variants: None significant. Review of the MIP images confirms the above findings IMPRESSION: No acute vascular finding to explain the clinical presentation. No sign of dissection or occlusion of the brachiocephalic or intracranial vessels. Mild atherosclerotic plaque as above but without flow limiting stenosis in the anterior circulation vessels. Posterior circulation vessels are patent. Atherosclerotic disease at both vertebral artery origins without measurable stenosis on the right and with focal 50% stenosis on the left. 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 Advanced cervical degenerative disease with spinal stenosis and foraminal stenosis at C4-5 and C5-6 that could be significant. Electronically Signed   By: Nelson Chimes M.D.   On: 11/03/2018 13:40   Ct Angio Neck W And/or Wo Contrast  Result Date: 11/03/2018 CLINICAL DATA:  Acute onset of chest pain today. Numbness of the left arm and hand. EXAM: CT ANGIOGRAPHY HEAD  AND NECK TECHNIQUE: Multidetector CT imaging of the head and neck was performed using  the standard protocol during bolus administration of intravenous contrast. Multiplanar CT image reconstructions and MIPs were obtained to evaluate the vascular anatomy. Carotid stenosis measurements (when applicable) are obtained utilizing NASCET criteria, using the distal internal carotid diameter as the denominator. CONTRAST:  159mL OMNIPAQUE IOHEXOL 350 MG/ML SOLN COMPARISON:  Head CT earlier same day FINDINGS: CTA NECK FINDINGS Aortic arch: Arch is negative. Branching pattern is normal without stenosis. Right carotid system: Common carotid artery widely patent to the bifurcation. No carotid bifurcation stenosis or irregularity. Minimal plaque in the ICA bulb. Left carotid system: Common carotid artery widely patent to the bifurcation. Mild atherosclerotic plaque at the carotid bifurcation and proximal external carotid. No internal carotid stenosis or irregularity. Vertebral arteries: Both vertebral artery origins are patent. Mild atherosclerotic plaque on the right but no flow limiting stenosis. On the left, there is 50% stenosis due to focal calcified plaque. Both vertebral arteries are patent through the cervical region to the foramen magnum. Skeleton: Degenerative cervical spondylosis. Prominent osteophytes/disc herniations at C4-5 and C5-6 that could cause symptomatic stenosis of the canal and or foramina. Other neck: Large right parotid mass involving both the deep lobe and superficial lobe extending inferior from the superficial lobe. This has been seen on multiple previous examinations and is not changing, consistent with benign nature. Axial measurements are unchanged since 2017 at 32 x 43 mm. Upper chest: Negative Review of the MIP images confirms the above findings CTA HEAD FINDINGS Anterior circulation: Both internal carotid arteries are patent through the skull base and siphon regions. The anterior and middle  cerebral vessels are patent without proximal stenosis, aneurysm or vascular malformation. No embolic occlusions are identified. Posterior circulation: Both vertebral arteries are widely patent through the foramen magnum to the basilar. The right is dominant. No basilar stenosis. Posterior circulation branch vessels are patent. Patent posterior communicating arteries on each side. Venous sinuses: Patent and normal. Anatomic variants: None significant. Review of the MIP images confirms the above findings IMPRESSION: No acute vascular finding to explain the clinical presentation. No sign of dissection or occlusion of the brachiocephalic or intracranial vessels. Mild atherosclerotic plaque as above but without flow limiting stenosis in the anterior circulation vessels. Posterior circulation vessels are patent. Atherosclerotic disease at both vertebral artery origins without measurable stenosis on the right and with focal 50% stenosis on the left. 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 Advanced cervical degenerative disease with spinal stenosis and foraminal stenosis at C4-5 and C5-6 that could be significant. Electronically Signed   By: Nelson Chimes M.D.   On: 11/03/2018 13:40   Dg Chest Portable 1 View  Result Date: 11/03/2018 CLINICAL DATA:  Pt states he woke up with chest pain this morning. He took 1 nitroglycerin which relieved the pain. One hour ( between 1030-1045) ago he reports numbness to L lower arm and hand., hx of TIA, HTN, no other complaints EXAM: PORTABLE CHEST 1 VIEW COMPARISON:  Chest radiographs dated 12/28 2019, 06/29/2015 FINDINGS: The heart size and mediastinal contours are within normal limits. The lungs are clear. No pneumothorax or pleural effusion. No acute abnormality in the visualized skeleton. IMPRESSION: Normal chest radiograph. Electronically Signed   By: Audie Pinto M.D.   On: 11/03/2018 11:44    Ct Angio Chest/abd/pel For Dissection W And/or Wo Contrast  Result Date: 11/03/2018 CLINICAL DATA:  58 year old male with acute chest and back pain as well as stroke-like symptoms concerning for dissection. EXAM:  CT ANGIOGRAPHY CHEST, ABDOMEN AND PELVIS TECHNIQUE: Multidetector CT imaging through the chest, abdomen and pelvis was performed using the standard protocol during bolus administration of intravenous contrast. Multiplanar reconstructed images and MIPs were obtained and reviewed to evaluate the vascular anatomy. CONTRAST:  189mL OMNIPAQUE IOHEXOL 350 MG/ML SOLN COMPARISON:  Prior CTA of the chest abdomen and pelvis 03/06/2018 FINDINGS: CTA CHEST FINDINGS Cardiovascular: 2 vessel aortic arch. The right brachiocephalic and left common carotid artery share a common origin. On the unenhanced images, there is no evidence of intramural high attenuation to suggest intramural hematoma. Following administration of intravenous contrast, there is very subtle irregularity of the intima in the ascending thoracic aorta just proximal to the aortic arch. There is a linear irregular filling defect extending into the aortic lumen measuring approximately 1.4 cm in length by 0.2 mm in width. This almost certainly represents an area of linear thrombus adherent to the aortic wall. No evidence of aortic wall thickening, stranding in the mediastinal fat or hemorrhage. The remainder of the aortic arch and descending thoracic aorta are normal. The aortic root is normal. The heart is normal in size. Calcifications are present along the left main, left anterior descending, circumflex and right coronary artery. No pericardial effusion. Mediastinum/Nodes: Unremarkable CT appearance of the thyroid gland. No suspicious mediastinal or hilar adenopathy. No soft tissue mediastinal mass. The thoracic esophagus is unremarkable. Lungs/Pleura: Lungs are clear. No pleural effusion or pneumothorax. Musculoskeletal: No acute fracture or  aggressive appearing lytic or blastic osseous lesion. Small low-attenuation lesion just deep to the skin surface on the right anterior chest wall just above the nipple line likely represents a sebaceous or other benign cutaneous cyst. Review of the MIP images confirms the above findings. CTA ABDOMEN AND PELVIS FINDINGS VASCULAR Aorta: The aorta is normal in caliber without evidence of aneurysm. Compared to the prior study from December of 2019, there has been significant interval progression of fibrofatty atherosclerotic plaque which is become irregular and ulcerated beginning at the aortic hiatus and extending through the visceral, renal and infrarenal segments of the aorta. Just below the renal arteries, the lumen is narrowed by approximately 70%. Celiac: Patent without evidence of aneurysm, dissection, vasculitis or significant stenosis. SMA: Patent without evidence of aneurysm, dissection, vasculitis or significant stenosis. Renals: Both renal arteries are patent without evidence of aneurysm, dissection, vasculitis, fibromuscular dysplasia or significant stenosis. IMA: Patent without evidence of aneurysm, dissection, vasculitis or significant stenosis. Inflow: Heterogeneous calcified and noncalcified atherosclerotic plaque without significant stenosis in either common iliac artery. No evidence of dissection. The left internal iliac artery is occluded at the origin but ultimately reconstitutes. The external iliac arteries are patent. 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 Veins: No focal venous abnormality. Review of the MIP images confirms the above findings. NON-VASCULAR Hepatobiliary: Normal morphology. Stable circumscribed low-attenuation lesions which almost certainly represent benign cysts. Gallbladder is unremarkable. No intra or extrahepatic biliary ductal dilatation. Pancreas: Unremarkable. No pancreatic ductal dilatation or surrounding inflammatory  changes. Spleen: Normal in size without focal abnormality. Adrenals/Urinary Tract: Normal adrenal glands. No evidence of hydronephrosis, nephrolithiasis or enhancing renal mass. Stomach/Bowel: Stomach is within normal limits. Appendix appears normal. No evidence of bowel wall thickening, distention, or inflammatory changes. Lymphatic: No suspicious lymphadenopathy. Reproductive: Asymmetric enhancement of the right posterolateral prostate gland with patchy enhancement elsewhere. Findings are significantly more conspicuous compared to prior imaging. Other: No abdominal wall hernia or abnormality. No abdominopelvic ascites. Musculoskeletal: Multifocal sclerotic foci  present throughout the skeleton. An index lesion in the left hemi sacrum measures slightly larger compared to the prior study at 0.9 cm compared to 0.8 cm previously. A second index lesion in the right transverse process of T1 measures 1.0 cm compared to 0.8 cm previously. Numerous additional lesions are present scattered throughout the thoracic and lumbar spine. These lesions are significantly smaller and not dramatically changed. Lesions are also identified in both iliac bones, bilateral ischial tuberosities, ribs and the right femoral head. The smaller of the 2 femoral head lesions also appears slightly enlarged. Review of the MIP images confirms the above findings. IMPRESSION: CTA CHEST 1. 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 This is just proximal to the origin of the right brachiocephalic artery which supplies both carotid arteries. This lesion is at high risk for embolization which could cause additional large vessel occlusion in the cerebral vasculature. The intimal irregularity could represent the form fruste of an aortic dissection if the patient has significant hypertension although the abnormality is confined  to the intima. There is no evidence of thickening or hemorrhage within the aortic media. 2. Left main and 3 vessel coronary artery disease. CTA ABD/PELVIS 1. No evidence of acute aortic dissection or aneurysm. 2. However, there has been significant interval progression of irregular/ulcerated hypoechoic plaque and likely wall adherent mural thrombus within the visceral, renal and infrarenal abdominal aorta. 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 3. 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 There is irregular patchy enhancement throughout the prostate gland more (more on the right than the left) with numerous multifocal sclerotic bone lesions in the pelvis, right femoral head, lumbar and thoracic spine and ribs. At least a few of the small sclerotic foci have subtly enlarged compared to prior imaging. Recommend correlation with serum PSA and referral to urology. These results were called by telephone at the time of interpretation on 11/03/2018 at 1:11 pm to Dr. Sherwood Gambler , who verbally acknowledged these results. Signed, Criselda Peaches, MD, [facility] Electronically Signed   By: Jacqulynn Cadet M.D.   On: 11/03/2018 13:59   Ct Head Code Stroke Wo Contrast  Result Date: 11/03/2018 CLINICAL DATA:  Code stroke.  Left arm numbness EXAM: CT HEAD WITHOUT CONTRAST TECHNIQUE: Contiguous axial images were obtained from the base of the skull through the vertex without intravenous contrast. COMPARISON:  06/30/2015 brain MRI FINDINGS: Brain: No evidence of acute infarction, hemorrhage, hydrocephalus, extra-axial collection or mass lesion/mass effect. Vascular: No hyperdense vessel or unexpected calcification. Skull: Normal. Negative for fracture or focal lesion. Sinuses/Orbits: No acute finding. Other:  These results were called by telephone at the time of interpretation on 11/03/2018 at 11:39 am to Dr. Sherwood Gambler , who verbally acknowledged these results. ASPECTS [facility] Stroke Program Early CT Score) - Ganglionic level infarction (caudate, lentiform nuclei, internal capsule, insula, M1-M3 cortex): 7 - Supraganglionic infarction (M4-M6 cortex): 3 Total score (0-10 with 10 being normal): 10 IMPRESSION: Negative head CT.ASPECTS is 10. Electronically Signed   By: Monte Fantasia M.D.   On: 11/03/2018 11:41    Procedures Procedures (including critical care time)  Medications Ordered in ED Medications  atorvastatin (LIPITOR) tablet 80 mg (80 mg Oral Not  Given 11/03/18 1158)  alteplase (ACTIVASE) 1 mg/mL injection (  Not Given 11/03/18 1330)  aspirin chewable tablet 324 mg (324 mg Oral Given 11/03/18 1158)  iohexol (OMNIPAQUE) 350 MG/ML injection 100 mL (100 mLs Intravenous Contrast Given 11/03/18 1242)  iohexol (OMNIPAQUE) 350 MG/ML injection 100 mL (100 mLs Intravenous Contrast Given 11/03/18 1241)     Initial Impression / Assessment and Plan / ED Course  I have reviewed the triage vital signs and the nursing notes.  Pertinent labs & imaging results that were available during my care of the patient were reviewed by me and considered in my medical decision making (see chart for details).  Clinical Course as of Aug 27 0001  Wed Nov 03, 2018  1128 Patient's exam at this time reveals no weakness though he feels a little weaker on the left upper extremity.  Given this feeling and he is in the code stroke window, a code stroke will be called.   [SG]  1150 Patient's NIH is low, and given significant improvement it is not a slam dunk to do TPA.  Neurology, Dr. Pricilla Holm, has talked with patient and offered TPA but he is hesitant given risks.  Thus decided to do every 15 neuro checks until window closes and if getting worse or something changes then reevaluate.  Asked for CTA of head and neck and I think  with chart review showing ectasia of aorta, will also do dissection protocol given earlier chest pain.   [SG]  1300 Patient tells me the hand numbness is still quite bothersome but has not gotten worse.  However, he is reconsidering whether or not to do TPA.  Thus will get tele-neurology back on the line.   [SG]  1301 Dr Tommi Rumps of tele-neuro has evaluated patient. Facial weakness better, now only left hand numbness. He advises no TPA, treat as stroke and per consult recommendations based on CT findings.   [SG]  1510 Review chart, ED provider note, radiology notes, will assess patient   [RD]  1524 Assessed patient, no distress, mild left arm tingling, no focal deficit on my exam, no chest pain, discussed case with CT staff, there were patient, will take patient soon, Dr. Meda Coffee I discussed with him specifically about specialized aortic study   [RD]  1715 Discussed with CT surgery, no dissection, does see thrombus, recommends cardiology and neurology consults; defers [facility] decision to cards   [RD]  1740 Discussed case with Dr. Ellyn Hack, recommends getting echo with bubble and Definity, treat concern for thrombus like LV thrombus, start AC, transition from brillinta to plavix, would need plavix load then plavix 75mg  daily   [RD]    Clinical Course User Index [RD] Lucrezia Starch, MD [SG] Sherwood Gambler, MD       58 year old gentleman who had presented to [place] emergency department this morning with a complaint of left arm numbness, chest pain.  Concern for stroke versus dissection initially.  CT angios chest concerning for dissection versus thrombus, CT surgery was contacted and transferred to our facility for further valuation.  Specialized CT study of aorta was reviewed by CT surgery and they felt this is more consistent with thrombus and not dissection.  [facility] neurology, cardiology and hospitalist services.  Patient after discussion with the services are in agreement to  start heparin, without bolus.  Admitted to hospitalist service for further management.  Dr. Jonelle Sidle hospitalist accepting.  Final Clinical Impressions(s) / ED Diagnoses   Final diagnoses:  Acute cardioembolic stroke (  [place])  Left arm numbness    ED Discharge Orders    None       Lucrezia Starch, MD 11/04/18 0003

## 2018-11-03 NOTE — ED Notes (Signed)
He states that initially, he had no feeling in his L arm and was unable to move it. Symptoms have since improved. Denies weakness or numbness to legs, denies difficulty with speech. Slight L facial droop noted.

## 2018-11-03 NOTE — Progress Notes (Addendum)
ANTICOAGULATION CONSULT NOTE - Initial Consult  Pharmacy Consult for heparin Indication: thrombus and CVA  No Known Allergies  Patient Measurements: Weight: 131 lb (59.4 kg) Heparin Dosing Weight: 59.4 kg  Vital Signs: Temp: 99 F (37.2 C) (08/26 1517) Temp Source: Oral (08/26 1517) BP: 139/97 (08/26 1530) Pulse Rate: 70 (08/26 1530)  Labs: Recent Labs    11/03/18 1140 11/03/18 1325 11/03/18 1535 11/03/18 1857  HGB 10.6*  --   --   --   HCT 32.6*  --   --   --   PLT 467*  --   --   --   APTT 27  --   --   --   LABPROT 13.0  --   --   --   INR 1.0  --   --   --   CREATININE 0.77  --   --   --   TROPONINIHS 53* 46* 73* 67*    Estimated Creatinine Clearance: 84.6 mL/min (by C-G formula based on SCr of 0.77 mg/dL).   Medical History: Past Medical History:  Diagnosis Date  . Abnormal chest CT    a. Ectatic 3 cm Ao arch - rec f/u outpt imaging w/ CTA or MRA. Ectatic atheromatous abd Ao @ risk for aneurysm - rec f/u u/s in 5 yrs. Marked prostatic enlargement w/ scattered small sclerotic foci in the thoracic lower lumbar spine, sacrum, left 12th rib, pelvis, and right femur.  Recommend elective outpatient whole-body bone scintigraphy and PSA.  Marland Kitchen CAD (coronary artery disease)    a. 02/2018 ACS/PCI: LM nl, LAD 85p (3.5x15 [place] DES), D1 45ost, RI 55ost, RCA nl, EF 25-35%.  . Complete left bundle branch block (LBBB) 2016  . Current every day smoker   . HFrEF (heart failure with reduced ejection fraction) ([place])   . Hypertension   . Ischemic cardiomyopathy 02/2018   a. 02/2018 Echo: EF 35-40% (LV gram on cath was 25-30%), mod, diff HK. mid-apicalanteroseptal, ant, and apical sev HK. RVH.  Marland Kitchen TIA (transient ischemic attack) 2016  . Warthin's tumor     Medications:  Scheduled:  . atorvastatin  80 mg Oral Once   Infusions:   Assessment: 22 yoM [facility] with [facility] for CTA work up aortic dissection vs. Clot. No tPa was given. No AC PTA. Kary Kos is currently  held. Pharmacy consulted to dose heparin. No surgery planned.  CBC low, Hgb 10.6. Plt 467. MRI brain with small right cerebral infarcts. Per neurology, small size of acute strokes, low likelihood of hemorrhagic transformation, will do heparin drip without bolus stroke protocol.  Goal of Therapy:  Heparin level 0.3-0.5 units/ml Monitor platelets by anticoagulation protocol: Yes   Plan:  No heparin bolus Heparin infusion at 750 units/hr Heparin level in 6 hours Monitor daily heparin levels, CBC, and s/sx of bleeding  Berenice Bouton, PharmD PGY1 Pharmacy Resident Office phone: [phone] Phone until 9 pm: [phone] 11/03/2018,7:51 PM

## 2018-11-03 NOTE — Progress Notes (Addendum)
TCTS BRIEF PROGRESS NOTE  Patient seen and examined, chart and CTA's reviewed Await final report from gated CTA, but there is no evidence of aortic dissection There does appear to be a defect in the distal ascending aorta that could be artifact versus small clot There is no clot in the LV or LA appendage  Plan: Per medical teams.  No indications for surgery apparent at this time.  Consider IV heparin and blood pressure control for now if okay with Neurology.  Hold Brilinta until final plans for therapy have been made.  Rexene Alberts, MD 11/03/2018 5:13 PM

## 2018-11-03 NOTE — ED Notes (Signed)
ED TO INPATIENT HANDOFF REPORT  ED Nurse Name and Phone #: Nolene Bernheim Name/Age/Gender Daniel Weiss 58 y.o. male Room/Bed: 030C/030C  Code Status   Code Status: Prior  Home/SNF/Other Home Patient oriented to: self, place, time and situation Is this baseline? Yes   Triage Complete: Triage complete  Chief Complaint CHEST PAIN, LEFT ARM NUMBNESS  Triage Note Pt states he woke up with chest pain this morning. He took 1 nitroglycerin which relieved the pain. One hour ( between 1030-1045) ago he reports numbness to L lower arm and hand.    Allergies No Known Allergies  Level of Care/Admitting Diagnosis ED Disposition    ED Disposition Condition [facility]: [place] [[id number]]  Level of Care: Progressive [102]  Covid Evaluation: Asymptomatic Screening Protocol (No Symptoms)  Diagnosis: [facility] cerebro[facility]) [id number]  Admitting Physician: Elwyn Reach [[id number]]  Attending Physician: Elwyn Reach [[id number]]  Estimated length of stay: past midnight tomorrow  Certification:: I certify this patient will need inpatient services for at least 2 midnights  PT Class (Do Not Modify): Inpatient [101]  PT Acc Code (Do Not Modify): Private [1]       B Medical/Surgery History Past Medical History:  Diagnosis Date  . Abnormal chest CT    a. Ectatic 3 cm Ao arch - rec f/u outpt imaging w/ CTA or MRA. Ectatic atheromatous abd Ao @ risk for aneurysm - rec f/u u/s in 5 yrs. Marked prostatic enlargement w/ scattered small sclerotic foci in the thoracic lower lumbar spine, sacrum, left 12th rib, pelvis, and right femur.  Recommend elective outpatient whole-body bone scintigraphy and PSA.  Marland Kitchen CAD (coronary artery disease)    a. 02/2018 ACS/PCI: LM nl, LAD 85p (3.5x15 Sierra DES), D1 45ost, RI 55ost, RCA nl, EF 25-35%.  . Complete left bundle branch block (LBBB) 2016  . Current every day smoker   . HFrEF (heart failure with reduced  ejection fraction) ([place])   . Hypertension   . Ischemic cardiomyopathy 02/2018   a. 02/2018 Echo: EF 35-40% (LV gram on cath was 25-30%), mod, diff HK. mid-apicalanteroseptal, ant, and apical sev HK. RVH.  Marland Kitchen TIA (transient ischemic attack) 2016  . Warthin's tumor    Past Surgical History:  Procedure Laterality Date  . CORONARY STENT INTERVENTION N/A 03/06/2018   Procedure: CORONARY STENT INTERVENTION;  Surgeon: [name], MD;  Location: [facility];  Service: Cardiovascular: Proximal LAD 85% stenosis (and D1): DES PCI with Xience Sierra DES 3.5 mm x 15 mm - 3.9 mm  . INTRAOPERATIVE TRANSTHORACIC ECHOCARDIOGRAM  03/06/2018   (Peri-MI) mild reduced EF 35-40%.  Diffuse hypokinesis (severe HK of the mid-apical anteroseptal, anterior and apical myocardium consistent with LAD infarct).  Unable to assess diastolic function.  Paradoxical septal motion likely related to his LBBB.  RV hypertrophy noted.  Trivial pericardial effusion noted.   Marland Kitchen LEFT HEART CATH AND CORONARY ANGIOGRAPHY N/A 03/06/2018   Procedure: LEFT HEART CATH AND CORONARY ANGIOGRAPHY;  Surgeon: [name], MD;  Location: [place];  Service: Cardiovascular: Proximal LAD 85% involving D1 45%.  Ostial RI 55%.  Severe LV dysfunction EF 25 to 30%.  1+ MR.  Only minimally elevated LVEDP.     A IV Location/Drains/Wounds Patient Lines/Drains/Airways Status   Active Line/Drains/Airways    Name:   Placement date:   Placement time:   Site:   Days:   Peripheral IV 11/03/18 Right Forearm  11/03/18    1143    Forearm   less than 1   Peripheral IV 11/03/18 Left Wrist   11/03/18    1310    Wrist   less than 1          Intake/Output Last 24 hours No intake or output data in the 24 hours ending 11/03/18 1920  Labs/Imaging Results for orders placed or performed during the hospital encounter of 11/03/18 (from the past 48 hour(s))  CBG monitoring, ED     Status: Abnormal   Collection Time: 11/03/18 11:23 AM   Result Value Ref Range   Glucose-Capillary 114 (H) 70 - 99 mg/dL   Comment 1 Notify RN    Comment 2 Document in Chart   Ethanol     Status: None   Collection Time: 11/03/18 11:40 AM  Result Value Ref Range   Alcohol, Ethyl (B)        <10 mg/dL    Comment: LOWEST DETECTABLE LIMIT FOR SERUM ALCOHOL IS 10 mg/dL FOR MEDICAL PURPOSES ONLY (NOTE) Lowest detectable limit for serum alcohol is 10 mg/dL. For medical purposes only. Performed at [facility], [address] [place], [place] [address]   Protime-INR     Status: None   Collection Time: 11/03/18 11:40 AM  Result Value Ref Range   Prothrombin Time 13.0 11.4 - 15.2 seconds   INR 1.0 0.8 - 1.2    Comment: (NOTE) INR goal varies based on device and disease states. Performed at [facility], [address] [place], [place] [address]   APTT     Status: None   Collection Time: 11/03/18 11:40 AM  Result Value Ref Range   aPTT 27 24 - 36 seconds    Comment: Performed at [facility], [address] [place], [place] [address]  CBC     Status: Abnormal   Collection Time: 11/03/18 11:40 AM  Result Value Ref Range   WBC 8.0 4.0 - 10.5 K/uL   RBC 4.02 (L) 4.22 - 5.81 MIL/uL   Hemoglobin 10.6 (L) 13.0 - 17.0 g/dL   HCT 32.6 (L) 39.0 - 52.0 %   MCV 81.1 80.0 - 100.0 fL   MCH 26.4 26.0 - 34.0 pg   MCHC 32.5 30.0 - 36.0 g/dL   RDW 16.8 (H) 11.5 - 15.5 %   Platelets 467 (H) 150 - 400 K/uL   nRBC 0.0 0.0 - 0.2 %    Comment: Performed at [facility], [address] [place], [place] [address]  Differential     Status: Abnormal   Collection Time: 11/03/18 11:40 AM  Result Value Ref Range   Neutrophils Relative % 58 %   Neutro Abs 4.7 1.7 - 7.7 K/uL   Lymphocytes Relative 26 %   Lymphs Abs 2.1 0.7 - 4.0 K/uL   Monocytes Relative 15 %   Monocytes Absolute 1.2 (H) 0.1 - 1.0 K/uL   Eosinophils Relative 0 %   Eosinophils Absolute 0.0 0.0 - 0.5 K/uL   Basophils Relative 1 %   Basophils  Absolute 0.0 0.0 - 0.1 K/uL   Immature Granulocytes 0 %   Abs Immature Granulocytes 0.03 0.00 - 0.07 K/uL    Comment: Performed at [facility], [address] [place], [place] [address]  Comprehensive metabolic panel     Status: Abnormal   Collection Time: 11/03/18 11:40 AM  Result Value Ref Range   Sodium 134 (  L) 135 - 145 mmol/L   Potassium 3.8 3.5 - 5.1 mmol/L   Chloride 100 98 - 111 mmol/L   CO2 24 22 - 32 mmol/L   Glucose, Bld 107 (H) 70 - 99 mg/dL   BUN 5 (L) 6 - 20 mg/dL   Creatinine, Ser 0.77 0.61 - 1.24 mg/dL   Calcium 8.6 (L) 8.9 - 10.3 mg/dL   Total Protein 6.6 6.5 - 8.1 g/dL   Albumin 2.9 (L) 3.5 - 5.0 g/dL   AST 16 15 - 41 U/L   ALT 9 0 - 44 U/L   Alkaline Phosphatase 84 38 - 126 U/L   Total Bilirubin 0.4 0.3 - 1.2 mg/dL   GFR calc non Af Amer >60 >60 mL/min   GFR calc Af Amer >60 >60 mL/min   Anion gap 10 5 - 15    Comment: Performed at [facility], [address] [place], [place] [address]  Troponin I (High Sensitivity)     Status: Abnormal   Collection Time: 11/03/18 11:40 AM  Result Value Ref Range   Troponin I (High Sensitivity) 53 (H) <18 ng/L    Comment: (NOTE) Elevated high sensitivity troponin I (hsTnI) values and significant  changes across serial measurements may suggest ACS but many other  chronic and acute conditions are known to elevate hsTnI results.  Refer to the "Links" section for chest pain algorithms and additional  guidance. Performed at [facility], [address] [place], [place] [address]   Urine rapid drug screen (hosp performed)     Status: Abnormal   Collection Time: 11/03/18  1:04 PM  Result Value Ref Range   Opiates NONE DETECTED NONE DETECTED   Cocaine NONE DETECTED NONE DETECTED   Benzodiazepines NONE DETECTED NONE DETECTED   Amphetamines NONE DETECTED NONE DETECTED   Tetrahydrocannabinol POSITIVE (A) NONE DETECTED   Barbiturates NONE DETECTED NONE DETECTED    Comment: (NOTE) DRUG SCREEN  FOR MEDICAL PURPOSES ONLY.  IF CONFIRMATION IS NEEDED FOR ANY PURPOSE, NOTIFY LAB WITHIN 5 DAYS. LOWEST DETECTABLE LIMITS FOR URINE DRUG SCREEN Drug Class                     Cutoff (ng/mL) Amphetamine and metabolites    1000 Barbiturate and metabolites    200 Benzodiazepine                 [id number] Tricyclics and metabolites     300 Opiates and metabolites        300 Cocaine and metabolites        300 THC                            50 Performed at [facility], [address] [place], [place] [address]   Urinalysis, Routine w reflex microscopic     Status: Abnormal   Collection Time: 11/03/18  1:04 PM  Result Value Ref Range   Color, Urine YELLOW YELLOW   APPearance CLEAR CLEAR   Specific Gravity, Urine <1.005 (L) 1.005 - 1.030   pH 6.5 5.0 - 8.0   Glucose, UA NEGATIVE NEGATIVE mg/dL   Hgb urine dipstick TRACE (A) NEGATIVE   Bilirubin Urine NEGATIVE NEGATIVE   Ketones, ur NEGATIVE NEGATIVE mg/dL   Protein, ur NEGATIVE NEGATIVE mg/dL   Nitrite NEGATIVE NEGATIVE   Leukocytes,Ua NEGATIVE NEGATIVE    Comment: Performed at [facility], [address] [place], [place] [address]  Urinalysis, Microscopic (reflex)     Status: Abnormal   Collection Time: 11/03/18  1:04 PM  Result Value Ref Range   RBC / HPF 0-5 0 - 5 RBC/hpf   WBC, UA 0-5 0 - 5 WBC/hpf   Bacteria, UA RARE (A) NONE SEEN   Squamous Epithelial / LPF 0-5 0 - 5   Sperm, UA PRESENT     Comment: Performed at [facility], [address] [place], [place] [address]  Troponin I (High Sensitivity)     Status: Abnormal   Collection Time: 11/03/18  1:25 PM  Result Value Ref Range   Troponin I (High Sensitivity) 46 (H) <18 ng/L    Comment: (NOTE) Elevated high sensitivity troponin I (hsTnI) values and significant  changes across serial measurements may suggest ACS but many other  chronic and acute conditions are known to elevate hsTnI results.  Refer to the "Links" section for chest pain  algorithms and additional  guidance. Performed at [facility], [address] [place], [place] [address]   Troponin I (High Sensitivity)     Status: Abnormal   Collection Time: 11/03/18  3:35 PM  Result Value Ref Range   Troponin I (High Sensitivity) 73 (H) <18 ng/L    Comment: (NOTE) Elevated high sensitivity troponin I (hsTnI) values and significant  changes across serial measurements may suggest ACS but many other  chronic and acute conditions are known to elevate hsTnI results.  Refer to the "Links" section for chest pain algorithms and additional  guidance. Performed at [facility], [place] [address], [place], [place] [address]    Ct Angio Head W Or Wo Contrast  Result Date: 11/03/2018 CLINICAL DATA:  Acute onset of chest pain today. Numbness of the left arm and hand. EXAM: CT ANGIOGRAPHY HEAD AND NECK TECHNIQUE: Multidetector CT imaging of the head and neck was performed using the standard protocol during bolus administration of intravenous contrast. Multiplanar CT image reconstructions and MIPs were obtained to evaluate the vascular anatomy. Carotid stenosis measurements (when applicable) are obtained utilizing NASCET criteria, using the distal internal carotid diameter as the denominator. CONTRAST:  139mL OMNIPAQUE IOHEXOL 350 MG/ML SOLN COMPARISON:  Head CT earlier same day FINDINGS: CTA NECK FINDINGS Aortic arch: Arch is negative. Branching pattern is normal without stenosis. Right carotid system: Common carotid artery widely patent to the bifurcation. No carotid bifurcation stenosis or irregularity. Minimal plaque in the ICA bulb. Left carotid system: Common carotid artery widely patent to the bifurcation. Mild atherosclerotic plaque at the carotid bifurcation and proximal external carotid. No internal carotid stenosis or irregularity. Vertebral arteries: Both vertebral artery origins are patent. Mild atherosclerotic plaque on the right but no flow limiting stenosis.  On the left, there is 50% stenosis due to focal calcified plaque. Both vertebral arteries are patent through the cervical region to the foramen magnum. Skeleton: Degenerative cervical spondylosis. Prominent osteophytes/disc herniations at C4-5 and C5-6 that could cause symptomatic stenosis of the canal and or foramina. Other neck: Large right parotid mass involving both the deep lobe and superficial lobe extending inferior from the superficial lobe. This has been seen on multiple previous examinations and is not changing, consistent with benign nature. Axial measurements are unchanged since 2017 at 32 x 43 mm. Upper chest: Negative Review of the MIP images confirms the above findings CTA HEAD FINDINGS Anterior circulation: Both internal carotid arteries are patent through the skull base and  siphon regions. The anterior and middle cerebral vessels are patent without proximal stenosis, aneurysm or vascular malformation. No embolic occlusions are identified. Posterior circulation: Both vertebral arteries are widely patent through the foramen magnum to the basilar. The right is dominant. No basilar stenosis. Posterior circulation branch vessels are patent. Patent posterior communicating arteries on each side. Venous sinuses: Patent and normal. Anatomic variants: None significant. Review of the MIP images confirms the above findings IMPRESSION: No acute vascular finding to explain the clinical presentation. No sign of dissection or occlusion of the brachiocephalic or intracranial vessels. Mild atherosclerotic plaque as above but without flow limiting stenosis in the anterior circulation vessels. Posterior circulation vessels are patent. Atherosclerotic disease at both vertebral artery origins without measurable stenosis on the right and with focal 50% stenosis on the left. 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 Advanced cervical  degenerative disease with spinal stenosis and foraminal stenosis at C4-5 and C5-6 that could be significant. Electronically Signed   By: Nelson Chimes M.D.   On: 11/03/2018 13:40   Ct Angio Neck W And/or Wo Contrast  Result Date: 11/03/2018 CLINICAL DATA:  Acute onset of chest pain today. Numbness of the left arm and hand. EXAM: CT ANGIOGRAPHY HEAD AND NECK TECHNIQUE: Multidetector CT imaging of the head and neck was performed using the standard protocol during bolus administration of intravenous contrast. Multiplanar CT image reconstructions and MIPs were obtained to evaluate the vascular anatomy. Carotid stenosis measurements (when applicable) are obtained utilizing NASCET criteria, using the distal internal carotid diameter as the denominator. CONTRAST:  164mL OMNIPAQUE IOHEXOL 350 MG/ML SOLN COMPARISON:  Head CT earlier same day FINDINGS: CTA NECK FINDINGS Aortic arch: Arch is negative. Branching pattern is normal without stenosis. Right carotid system: Common carotid artery widely patent to the bifurcation. No carotid bifurcation stenosis or irregularity. Minimal plaque in the ICA bulb. Left carotid system: Common carotid artery widely patent to the bifurcation. Mild atherosclerotic plaque at the carotid bifurcation and proximal external carotid. No internal carotid stenosis or irregularity. Vertebral arteries: Both vertebral artery origins are patent. Mild atherosclerotic plaque on the right but no flow limiting stenosis. On the left, there is 50% stenosis due to focal calcified plaque. Both vertebral arteries are patent through the cervical region to the foramen magnum. Skeleton: Degenerative cervical spondylosis. Prominent osteophytes/disc herniations at C4-5 and C5-6 that could cause symptomatic stenosis of the canal and or foramina. Other neck: Large right parotid mass involving both the deep lobe and superficial lobe extending inferior from the superficial lobe. This has been seen on multiple previous  examinations and is not changing, consistent with benign nature. Axial measurements are unchanged since 2017 at 32 x 43 mm. Upper chest: Negative Review of the MIP images confirms the above findings CTA HEAD FINDINGS Anterior circulation: Both internal carotid arteries are patent through the skull base and siphon regions. The anterior and middle cerebral vessels are patent without proximal stenosis, aneurysm or vascular malformation. No embolic occlusions are identified. Posterior circulation: Both vertebral arteries are widely patent through the foramen magnum to the basilar. The right is dominant. No basilar stenosis. Posterior circulation branch vessels are patent. Patent posterior communicating arteries on each side. Venous sinuses: Patent and normal. Anatomic variants: None significant. Review of the MIP images confirms the above findings IMPRESSION: No acute vascular finding to explain the clinical presentation. No sign of dissection or occlusion of the brachiocephalic or intracranial vessels. Mild atherosclerotic plaque  as above but without flow limiting stenosis in the anterior circulation vessels. Posterior circulation vessels are patent. Atherosclerotic disease at both vertebral artery origins without measurable stenosis on the right and with focal 50% stenosis on the left. 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 Advanced cervical degenerative disease with spinal stenosis and foraminal stenosis at C4-5 and C5-6 that could be significant. Electronically Signed   By: Nelson Chimes M.D.   On: 11/03/2018 13:40   Mr Brain Wo Contrast  Result Date: 11/03/2018 CLINICAL DATA:  Left upper extremity weakness/numbness and chest pain. EXAM: MRI HEAD WITHOUT CONTRAST TECHNIQUE: Multiplanar, multiecho pulse sequences of the brain and surrounding structures were obtained without intravenous contrast. COMPARISON:  Head CT 11/03/2018 and  MRI 06/30/2015 FINDINGS: Brain: A few subcentimeter acute infarcts are present in the posterior right frontal and right parietal lobes involving cortex and subcortical white matter. Small foci of T2 hyperintensity in the cerebral white matter bilaterally have mildly progressed from 2017 and are nonspecific but compatible with mildly age advanced chronic small vessel ischemic disease. The ventricles and sulci are within normal limits for age. Vascular: Major intracranial vascular flow voids are preserved. Skull and upper cervical spine: Unremarkable bone marrow signal. Sinuses/Orbits: Unremarkable orbits. Minimal scattered mucosal thickening in the paranasal sinuses. Clear mastoid air cells. Other: Partially visualized large right parotid region mass as seen on multiple prior examinations. IMPRESSION: 1. Small acute right frontoparietal infarcts. 2. Mild chronic small vessel ischemic disease, mildly progressed from 2017. Electronically Signed   By: Logan Bores M.D.   On: 11/03/2018 18:37   Ct Coronary Morph W/cta Cor W/score W/ca W/cm &/or Wo/cm  Result Date: 11/03/2018 EXAM: OVER-READ INTERPRETATION  CT CHEST The following report is an over-read performed by radiologist Dr. Rolm Baptise of [facility], [place] on 11/03/2018. This over-read does not include interpretation of cardiac or coronary anatomy or pathology. The coronary CTA interpretation by the cardiologist is attached. COMPARISON:  CTA chest earlier today FINDINGS: Vascular: There is intimal irregularity with filling defect extending into the lumen of the distal ascending thoracic aorta near the proximal aortic arch. This is stable since study earlier today. No evidence of thoracic aortic aneurysm. Mediastinum/Nodes: No mediastinal, hilar, or axillary adenopathy. Lungs/Pleura: Lungs clear.  No effusions. Upper Abdomen: Imaging into the upper abdomen shows no acute findings. Musculoskeletal: Scattered sclerotic densities seen in the thoracic spine as  described on prior CT. IMPRESSION: Intimal irregularity with luminal filling defect in the proximal arch/distal ascending thoracic aorta, stable since prior study. Electronically Signed   By: Rolm Baptise M.D.   On: 11/03/2018 17:07   Dg Chest Portable 1 View  Result Date: 11/03/2018 CLINICAL DATA:  Pt states he woke up with chest pain this morning. He took 1 nitroglycerin which relieved the pain. One hour ( between 1030-1045) ago he reports numbness to L lower arm and hand., hx of TIA, HTN, no other complaints EXAM: PORTABLE CHEST 1 VIEW COMPARISON:  Chest radiographs dated 12/28 2019, 06/29/2015 FINDINGS: The heart size and mediastinal contours are within normal limits. The lungs are clear. No pneumothorax or pleural effusion. No acute abnormality in the visualized skeleton. IMPRESSION: Normal chest radiograph. Electronically Signed   By: Audie Pinto M.D.   On: 11/03/2018 11:44   Ct Angio Chest/abd/pel For Dissection W And/or Wo Contrast  Result Date: 11/03/2018 CLINICAL DATA:  58 year old male with acute chest and back pain as well as stroke-like symptoms concerning  for dissection. EXAM: CT ANGIOGRAPHY CHEST, ABDOMEN AND PELVIS TECHNIQUE: Multidetector CT imaging through the chest, abdomen and pelvis was performed using the standard protocol during bolus administration of intravenous contrast. Multiplanar reconstructed images and MIPs were obtained and reviewed to evaluate the vascular anatomy. CONTRAST:  145mL OMNIPAQUE IOHEXOL 350 MG/ML SOLN COMPARISON:  Prior CTA of the chest abdomen and pelvis 03/06/2018 FINDINGS: CTA CHEST FINDINGS Cardiovascular: 2 vessel aortic arch. The right brachiocephalic and left common carotid artery share a common origin. On the unenhanced images, there is no evidence of intramural high attenuation to suggest intramural hematoma. Following administration of intravenous contrast, there is very subtle irregularity of the intima in the ascending thoracic aorta just  proximal to the aortic arch. There is a linear irregular filling defect extending into the aortic lumen measuring approximately 1.4 cm in length by 0.2 mm in width. This almost certainly represents an area of linear thrombus adherent to the aortic wall. No evidence of aortic wall thickening, stranding in the mediastinal fat or hemorrhage. The remainder of the aortic arch and descending thoracic aorta are normal. The aortic root is normal. The heart is normal in size. Calcifications are present along the left main, left anterior descending, circumflex and right coronary artery. No pericardial effusion. Mediastinum/Nodes: Unremarkable CT appearance of the thyroid gland. No suspicious mediastinal or hilar adenopathy. No soft tissue mediastinal mass. The thoracic esophagus is unremarkable. Lungs/Pleura: Lungs are clear. No pleural effusion or pneumothorax. Musculoskeletal: No acute fracture or aggressive appearing lytic or blastic osseous lesion. Small low-attenuation lesion just deep to the skin surface on the right anterior chest wall just above the nipple line likely represents a sebaceous or other benign cutaneous cyst. Review of the MIP images confirms the above findings. CTA ABDOMEN AND PELVIS FINDINGS VASCULAR Aorta: The aorta is normal in caliber without evidence of aneurysm. Compared to the prior study from December of 2019, there has been significant interval progression of fibrofatty atherosclerotic plaque which is become irregular and ulcerated beginning at the aortic hiatus and extending through the visceral, renal and infrarenal segments of the aorta. Just below the renal arteries, the lumen is narrowed by approximately 70%. Celiac: Patent without evidence of aneurysm, dissection, vasculitis or significant stenosis. SMA: Patent without evidence of aneurysm, dissection, vasculitis or significant stenosis. Renals: Both renal arteries are patent without evidence of aneurysm, dissection, vasculitis,  fibromuscular dysplasia or significant stenosis. IMA: Patent without evidence of aneurysm, dissection, vasculitis or significant stenosis. Inflow: Heterogeneous calcified and noncalcified atherosclerotic plaque without significant stenosis in either common iliac artery. No evidence of dissection. The left internal iliac artery is occluded at the origin but ultimately reconstitutes. The external iliac arteries are patent. 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 Veins: No focal venous abnormality. Review of the MIP images confirms the above findings. NON-VASCULAR Hepatobiliary: Normal morphology. Stable circumscribed low-attenuation lesions which almost certainly represent benign cysts. Gallbladder is unremarkable. No intra or extrahepatic biliary ductal dilatation. Pancreas: Unremarkable. No pancreatic ductal dilatation or surrounding inflammatory changes. Spleen: Normal in size without focal abnormality. Adrenals/Urinary Tract: Normal adrenal glands. No evidence of hydronephrosis, nephrolithiasis or enhancing renal mass. Stomach/Bowel: Stomach is within normal limits. Appendix appears normal. No evidence of bowel wall thickening, distention, or inflammatory changes. Lymphatic: No suspicious lymphadenopathy. Reproductive: Asymmetric enhancement of the right posterolateral prostate gland with patchy enhancement elsewhere. Findings are significantly more conspicuous compared to prior imaging. Other: No abdominal wall hernia or abnormality. No abdominopelvic ascites. Musculoskeletal:  Multifocal sclerotic foci present throughout the skeleton. An index lesion in the left hemi sacrum measures slightly larger compared to the prior study at 0.9 cm compared to 0.8 cm previously. A second index lesion in the right transverse process of T1 measures 1.0 cm compared to 0.8 cm previously. Numerous additional lesions are present scattered throughout the thoracic and lumbar spine.  These lesions are significantly smaller and not dramatically changed. Lesions are also identified in both iliac bones, bilateral ischial tuberosities, ribs and the right femoral head. The smaller of the 2 femoral head lesions also appears slightly enlarged. Review of the MIP images confirms the above findings. IMPRESSION: CTA CHEST 1. 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 This is just proximal to the origin of the right brachiocephalic artery which supplies both carotid arteries. This lesion is at high risk for embolization which could cause additional large vessel occlusion in the cerebral vasculature. The intimal irregularity could represent the form fruste of an aortic dissection if the patient has significant hypertension although the abnormality is confined to the intima. There is no evidence of thickening or hemorrhage within the aortic media. 2. Left main and 3 vessel coronary artery disease. CTA ABD/PELVIS 1. No evidence of acute aortic dissection or aneurysm. 2. However, there has been significant interval progression of irregular/ulcerated hypoechoic plaque and likely wall adherent mural thrombus within the visceral, renal and infrarenal abdominal aorta. 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 3. 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 There is irregular patchy enhancement throughout the prostate gland more (more on the right than the left) with numerous multifocal sclerotic bone lesions in the pelvis, right femoral head, lumbar and thoracic spine and ribs. At least a few of the small sclerotic foci have subtly enlarged compared to prior imaging. Recommend correlation with serum PSA and referral to urology. These results  were called by telephone at the time of interpretation on 11/03/2018 at 1:11 pm to Dr. Sherwood Gambler , who verbally acknowledged these results. Signed, Criselda Peaches, MD, [facility] Radiology Electronically Signed   By: Jacqulynn Cadet M.D.   On: 11/03/2018 13:59   Ct Head Code Stroke Wo Contrast  Result Date: 11/03/2018 CLINICAL DATA:  Code stroke.  Left arm numbness EXAM: CT HEAD WITHOUT CONTRAST TECHNIQUE: Contiguous axial images were obtained from the base of the skull through the vertex without intravenous contrast. COMPARISON:  06/30/2015 brain MRI FINDINGS: Brain: No evidence of acute infarction, hemorrhage, hydrocephalus, extra-axial collection or mass lesion/mass effect. Vascular: No hyperdense vessel or unexpected calcification. Skull: Normal. Negative for fracture or focal lesion. Sinuses/Orbits: No acute finding. Other: These results were called by telephone at the time of interpretation on 11/03/2018 at 11:39 am to Dr. Sherwood Gambler , who verbally acknowledged these results. ASPECTS [facility] Stroke Program Early CT Score) - Ganglionic level infarction (caudate, lentiform nuclei, internal capsule, insula, M1-M3 cortex): 7 - Supraganglionic infarction (M4-M6 cortex): 3 Total score (0-10 with 10 being normal): 10 IMPRESSION: Negative head CT.ASPECTS is 10. Electronically Signed   By: Monte Fantasia M.D.   On: 11/03/2018 11:41    Pending Labs Unresulted Labs (From admission, onward)    Start     Ordered   11/03/18  1423  PSA  ONCE - STAT,   STAT     11/03/18 1422   11/03/18 1157  SARS CORONAVIRUS 2 (TAT 6-12 HRS) Nasal Swab Aptima Multi Swab  (Asymptomatic/Tier 2 Patients Labs)  Once,   STAT    Question Answer Comment  Is this test for diagnosis or screening Screening   Symptomatic for COVID-19 as defined by CDC No   Hospitalized for COVID-19 No   Admitted to ICU for COVID-19 No   Previously tested for COVID-19 No   Resident  in a congregate (group) care setting No   Employed in healthcare setting No      11/03/18 1156   Signed and Held  HIV antibody (Routine Testing)  Once,   R     Signed and Held   Signed and Held  Hemoglobin A1c  Tomorrow morning,   R     Signed and Held   Signed and Held  Lipid panel  Tomorrow morning,   R    Comments: Fasting    Signed and Held          Vitals/Pain Today's Vitals   11/03/18 1439 11/03/18 1513 11/03/18 1517 11/03/18 1530  BP:  (!) 167/107 (!) 167/107 (!) 139/97  Pulse:  71 78 70  Resp:  13 17 20   Temp:   99 F (37.2 C)   TempSrc:   Oral   SpO2:  100% 99% 99%  Weight:      PainSc: 0-No pain       Isolation Precautions No active isolations  Medications Medications  atorvastatin (LIPITOR) tablet 80 mg (80 mg Oral Not Given 11/03/18 1158)  aspirin chewable tablet 324 mg (324 mg Oral Given 11/03/18 1158)  iohexol (OMNIPAQUE) 350 MG/ML injection 100 mL (100 mLs Intravenous Contrast Given 11/03/18 1242)  iohexol (OMNIPAQUE) 350 MG/ML injection 100 mL (100 mLs Intravenous Contrast Given 11/03/18 1241)    Mobility walks Low fall risk   Focused Assessments Neuro Assessment Handoff:  Swallow screen pass? Deferred Cardiac Rhythm: Bundle branch block NIH Stroke Scale ( + Modified Stroke Scale Criteria)  Interval: Other (Comment)(reassessment by different teleneurologist) Level of Consciousness (1a.)   : Alert, keenly responsive LOC Questions (1b. )   +: Answers both questions correctly LOC Commands (1c. )   + : Performs both tasks correctly Best Gaze (2. )  +: Normal Visual (3. )  +: No visual loss Facial Palsy (4. )    : Normal symmetrical movements Motor Arm, Left (5a. )   +: No drift Motor Arm, Right (5b. )   +: No drift Motor Leg, Left (6a. )   +: No drift Motor Leg, Right (6b. )   +: No drift Limb Ataxia (7. ): Absent Sensory (8. )   +: Mild-to-moderate sensory loss, patient feels pinprick is less sharp or is dull on the affected side, or there is a  loss of superficial pain with pinprick, but patient is aware of being touched Best Language (9. )   +: No aphasia Dysarthria (10. ): Normal Extinction/Inattention (11.)   +: No Abnormality Modified SS Total  +: 1 Complete NIHSS TOTAL: 1     Neuro Assessment: Exceptions to WDL Neuro Checks:   Initial (11/03/18 1131)  Last Documented NIHSS Modified Score: 1 (11/03/18 1559) Has TPA been given? No If patient is a Neuro Trauma and patient is going to OR before floor call report to [place] nurse: [phone] or [phone]     R Recommendations: See Admitting  Provider Note  Report given to:   Additional Notes:

## 2018-11-03 NOTE — Plan of Care (Addendum)
Discussed care with Sherwood Gambler.  Transferring from [facility] to cardiac telemetry bed.  Mr. Solazzo is a 58 year old male with pmh HTN, tobacco use, CAD s/p DES in 02/2018, and previous concern for abdominal aneurysm; who presented with complaints of left arm and facial weakness.  Symptoms improving and patient complaining of left arm numbness.  Initially seen as a code stroke but was not a TPA candidate.  He he also initially had vague complaints of chest pain.  A CT angiogram of the chest revealed concern for dissection versus clot in the inferior aorta and possibility of metastatic prostate cancer with multiple sclerotic bone lesion seen.  CT surgery Dr. Roxy Manns was consulted and recommended a cardiac gated CTA of the heart to evaluate whether patient was actually having a dissection or not.  CTS surgery requested immediate transfer to [facility] for further imaging and assessment.  No bed orders placed.

## 2018-11-03 NOTE — ED Notes (Signed)
Patient transported to CT 

## 2018-11-03 NOTE — H&P (Signed)
History and Physical   [name] [id number] DOB: 1961-01-09 DOA: 11/03/2018  Referring MD/NP/PA: Dr Roslynn Amble  PCP: Patient, No Pcp Per   Outpatient Specialists: Dr. Ellyn Hack of cardiology  Patient coming from: Home via [facility]  Chief Complaint: Left arm tingling and numbness with chest pain  HPI: Daniel Weiss is a 58 y.o. male with medical history significant of coronary artery disease status post cardiac cath with angioplasty back in December 2019, ischemic cardiomyopathy with EF of 35 to 40%, hypertension, tobacco abuse, hyperlipidemia who went to [place] complaining of the left arm tingling weakness as well as chest pain.  Patient was evaluated initially for code stroke.  Tele-neurology was involved.  He also reported some chest pain at the time for which he took nitroglycerin.  Symptoms started many hours prior to arrival in the ER.  Numbness was persistent for couple of hours but currently gone only a little tingling.  No weakness.  No visual changes.  He has CT angiogram of the chest abdomen pelvis as well as head and neck.  This was all concern for stroke and possible dissection based on presentation.  There was questionable early dissection versus thrombus in his aorta.  He was sent over to [facility], ER for further evaluation.  Vascular surgery Dr. Ricard Dillon was also consulted.  He had a gated CTA that showed no evidence of aortic dissection.  There was possibly a defect in the distal ascending aorta which could be an artifact versus small clot.  There is suspicion for the for possible thrombus in the left ventricle.  Patient is being admitted for full stroke work-up but also treatment for possible thrombus.  Symptoms have largely resolved at this point..  ED Course: Temperature is 98.1 blood pressure 167/107 pulse 87 respirate 28 oxygen sat 96% room air.  Urine drug screen is positive for THC.  Urinalysis negative.  Sodium 134 potassium 3.8 chloride 100 CO2 24  glucose is 107 BUN 5 creatinine 0.77 calcium 8.6 Albumin 2.9.  Troponin initially 53 with then 4673 and now 67.  White count 8.1 hemoglobin of 10.6 platelet 467.  PT 13 INR 1.0.  CT angiogram showed no evidence of acute aortic dissection.  There was significant interval progression of irregular or ulcerated plaque at a wall may be adherent to the mural thrombus within the visceral renal and infrarenal abdominal aorta.  There was suspicion of embolic phenomena therefore.  CT findings are also concerning for prostate cancer with progressive osseous metastatic disease.  There is irregular patchy enhancement throughout the prostate gland and sclerotic bone lesion.  PSA is 502.  MRI of the brain showed small frontoparietal infarcts.  Patient is being admitted for further work-up.  Review of Systems: As per HPI otherwise 10 point review of systems negative.    Past Medical History:  Diagnosis Date   Abnormal chest CT    a. Ectatic 3 cm Ao arch - rec f/u outpt imaging w/ CTA or MRA. Ectatic atheromatous abd Ao @ risk for aneurysm - rec f/u u/s in 5 yrs. Marked prostatic enlargement w/ scattered small sclerotic foci in the thoracic lower lumbar spine, sacrum, left 12th rib, pelvis, and right femur.  Recommend elective outpatient whole-body bone scintigraphy and PSA.   CAD (coronary artery disease)    a. 02/2018 ACS/PCI: LM nl, LAD 85p (3.5x15 Sierra DES), D1 45ost, RI 55ost, RCA nl, EF 25-35%.   Complete left bundle branch block (LBBB) 2016   Current every  day smoker    HFrEF (heart failure with reduced ejection fraction) ([place])    Hypertension    Ischemic cardiomyopathy 02/2018   a. 02/2018 Echo: EF 35-40% (LV gram on cath was 25-30%), mod, diff HK. mid-apicalanteroseptal, ant, and apical sev HK. RVH.   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  Diffuse hypokinesis (severe HK of the mid-apical anteroseptal, anterior and apical myocardium consistent with LAD infarct).  Unable to assess diastolic function.  Paradoxical septal motion likely related to his LBBB.  RV hypertrophy noted.  Trivial pericardial effusion noted.    LEFT HEART CATH AND CORONARY ANGIOGRAPHY N/A 03/06/2018   Procedure: LEFT HEART CATH AND CORONARY ANGIOGRAPHY;  Surgeon: [name], MD;  Location: [place] [facility];  Service: Cardiovascular: Proximal LAD 85% involving D1 45%.  Ostial RI 55%.  Severe LV dysfunction EF 25 to 30%.  1+ MR.  Only minimally elevated LVEDP.     reports that he has been smoking cigarettes. He has been smoking about 0.50 packs per day. He quit smokeless tobacco use about 8 months ago. He reports current alcohol use of about 2.0 standard drinks of alcohol per week. He reports current drug use. Frequency: 3.00 times per week. Drug: Marijuana.  No Known Allergies  Family History  Problem Relation Age of Onset   Stroke Mother    Hypertension Mother    Diabetes type II Mother    Leukemia Other    Breast cancer Other    CAD Neg Hx      Prior to Admission medications   Medication Sig Start Date End Date Taking? Authorizing Provider  acetaminophen (TYLENOL) 325 MG tablet Take 2 tablets (650 mg total) by mouth every 4 (four) hours as needed for mild pain (temperature >/= 99.5 F). 07/08/14  Yes Ahmed, Chesley Mires, MD  aspirin EC 81 MG EC tablet Take 1 tablet (81 mg total) by mouth daily. 07/08/14  Yes Ahmed, Chesley Mires, MD  atorvastatin (LIPITOR) 80 MG tablet Take 1 tablet (80 mg total) by mouth daily. 03/10/18 11/03/18 Yes Theora Gianotti, NP    carvedilol (COREG) 12.5 MG tablet Take 1 tablet (12.5 mg total) by mouth 2 (two) times daily with a meal. 06/25/18  Yes [name], MD  ticagrelor (BRILINTA) 90 MG TABS tablet Take 1 tablet (90 mg total) by mouth 2 (two) times daily. 03/10/18  Yes Theora Gianotti, NP  lisinopril (PRINIVIL,ZESTRIL) 10 MG tablet Take 1 tablet (10 mg total) by mouth daily. 03/26/18 06/25/18  [name], MD  nitroGLYCERIN (NITROSTAT) 0.4 MG SL tablet Place 1 tablet (0.4 mg total) under the tongue every 5 (five) minutes as needed for chest pain. 03/10/18   Theora Gianotti, NP    Physical Exam: Vitals:   11/03/18 1430 11/03/18 1513 11/03/18 1517 11/03/18 1530  BP: (!) 141/93 (!) 167/107 (!) 167/107 (!) 139/97  Pulse: 71 71 78 70  Resp: 20 13 17 20   Temp:   99 F (37.2 C)   TempSrc:   Oral   SpO2: 98% 100% 99% 99%  Weight:          Constitutional: NAD, mildly anxious Vitals:   11/03/18 1430 11/03/18 1513 11/03/18 1517 11/03/18 1530  BP: (!) 141/93 (!) 167/107 (!) 167/107 (!) 139/97  Pulse: 71 71 78 70  Resp: 20 13 17 20   Temp:   99 F (37.2 C)   TempSrc:   Oral   SpO2: 98% 100% 99% 99%  Weight:       Eyes: PERRL, lids and conjunctivae normal ENMT: Mucous membranes are moist. Posterior pharynx clear of any exudate or lesions.Normal dentition.  Neck: Right neck swelling,normal, supple, no masses, no thyromegaly Respiratory: clear to auscultation bilaterally, no wheezing, no crackles. Normal respiratory effort. No accessory muscle use.  Cardiovascular: Regular rate and rhythm, no murmurs / rubs / gallops. No extremity edema. 2+ pedal pulses. No carotid bruits.  Abdomen: no tenderness, no masses palpated. No hepatosplenomegaly. Bowel sounds positive.  Musculoskeletal: no clubbing / cyanosis. No joint deformity upper and lower extremities. Good ROM, no contractures. Normal muscle tone.  Skin: no rashes, lesions, ulcers. No induration Neurologic: CN 2-12 grossly intact.  Sensation intact, DTR normal. Strength 5/5 in all 4.  Psychiatric: Normal judgment and insight. Alert and oriented x 3.  Anxious.     Labs on Admission: I have personally reviewed following labs and imaging studies  CBC: Recent Labs  Lab 11/03/18 1140  WBC 8.0  NEUTROABS 4.7  HGB 10.6*  HCT 32.6*  MCV 81.1  PLT 0000000*   Basic Metabolic Panel: Recent Labs  Lab 11/03/18 1140  NA 134*  K 3.8  CL 100  CO2 24  GLUCOSE 107*  BUN 5*  CREATININE 0.77  CALCIUM 8.6*   GFR: Estimated Creatinine Clearance: 84.6 mL/min (by C-G formula based on SCr of 0.77 mg/dL). Liver Function Tests: Recent Labs  Lab 11/03/18 1140  AST 16  ALT 9  ALKPHOS 84  BILITOT 0.4  PROT 6.6  ALBUMIN 2.9*   No results for input(s): LIPASE, AMYLASE in the last 168 hours. No results for input(s): AMMONIA in the last 168 hours. Coagulation Profile: Recent Labs  Lab 11/03/18 1140  INR 1.0   Cardiac Enzymes: No results for input(s): CKTOTAL, CKMB, CKMBINDEX, TROPONINI in the last 168 hours. BNP (last 3 results) No results for input(s): PROBNP in the last 8760 hours. HbA1C: No results for input(s): HGBA1C in the last 72 hours. CBG: Recent Labs  Lab 11/03/18 1123  GLUCAP 114*   Lipid Profile: No results for input(s): CHOL, HDL, LDLCALC, TRIG, CHOLHDL, LDLDIRECT in the last 72 hours. Thyroid Function Tests: No results for input(s): TSH, T4TOTAL, FREET4, T3FREE, THYROIDAB in the last 72 hours. Anemia Panel: No results for input(s): VITAMINB12, FOLATE, FERRITIN, TIBC, IRON, RETICCTPCT in the last 72 hours. Urine analysis:    Component Value Date/Time   COLORURINE YELLOW 11/03/2018 1304   APPEARANCEUR CLEAR 11/03/2018 1304   LABSPEC <1.005 (L) 11/03/2018 1304   PHURINE 6.5 11/03/2018 1304   GLUCOSEU NEGATIVE 11/03/2018 1304   HGBUR TRACE (A) 11/03/2018 1304   BILIRUBINUR NEGATIVE 11/03/2018 1304   KETONESUR NEGATIVE 11/03/2018 1304   PROTEINUR NEGATIVE 11/03/2018 1304   UROBILINOGEN 1.0  07/07/2014 1022   NITRITE NEGATIVE 11/03/2018 1304   LEUKOCYTESUR NEGATIVE 11/03/2018 1304   Sepsis Labs: @LABRCNTIP (procalcitonin:4,lacticidven:4) )No results found for this or any previous visit (from the past 240 hour(s)).   Radiological Exams on Admission: Ct Angio Head W Or Wo Contrast  Result Date: 11/03/2018 CLINICAL DATA:  Acute onset of chest pain today. Numbness  of the left arm and hand. EXAM: CT ANGIOGRAPHY HEAD AND NECK TECHNIQUE: Multidetector CT imaging of the head and neck was performed using the standard protocol during bolus administration of intravenous contrast. Multiplanar CT image reconstructions and MIPs were obtained to evaluate the vascular anatomy. Carotid stenosis measurements (when applicable) are obtained utilizing NASCET criteria, using the distal internal carotid diameter as the denominator. CONTRAST:  179mL OMNIPAQUE IOHEXOL 350 MG/ML SOLN COMPARISON:  Head CT earlier same day FINDINGS: CTA NECK FINDINGS Aortic arch: Arch is negative. Branching pattern is normal without stenosis. Right carotid system: Common carotid artery widely patent to the bifurcation. No carotid bifurcation stenosis or irregularity. Minimal plaque in the ICA bulb. Left carotid system: Common carotid artery widely patent to the bifurcation. Mild atherosclerotic plaque at the carotid bifurcation and proximal external carotid. No internal carotid stenosis or irregularity. Vertebral arteries: Both vertebral artery origins are patent. Mild atherosclerotic plaque on the right but no flow limiting stenosis. On the left, there is 50% stenosis due to focal calcified plaque. Both vertebral arteries are patent through the cervical region to the foramen magnum. Skeleton: Degenerative cervical spondylosis. Prominent osteophytes/disc herniations at C4-5 and C5-6 that could cause symptomatic stenosis of the canal and or foramina. Other neck: Large right parotid mass involving both the deep lobe and superficial lobe  extending inferior from the superficial lobe. This has been seen on multiple previous examinations and is not changing, consistent with benign nature. Axial measurements are unchanged since 2017 at 32 x 43 mm. Upper chest: Negative Review of the MIP images confirms the above findings CTA HEAD FINDINGS Anterior circulation: Both internal carotid arteries are patent through the skull base and siphon regions. The anterior and middle cerebral vessels are patent without proximal stenosis, aneurysm or vascular malformation. No embolic occlusions are identified. Posterior circulation: Both vertebral arteries are widely patent through the foramen magnum to the basilar. The right is dominant. No basilar stenosis. Posterior circulation branch vessels are patent. Patent posterior communicating arteries on each side. Venous sinuses: Patent and normal. Anatomic variants: None significant. Review of the MIP images confirms the above findings IMPRESSION: No acute vascular finding to explain the clinical presentation. No sign of dissection or occlusion of the brachiocephalic or intracranial vessels. Mild atherosclerotic plaque as above but without flow limiting stenosis in the anterior circulation vessels. Posterior circulation vessels are patent. Atherosclerotic disease at both vertebral artery origins without measurable stenosis on the right and with focal 50% stenosis on the left. 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 Advanced cervical degenerative disease with spinal stenosis and foraminal stenosis at C4-5 and C5-6 that could be significant. Electronically Signed   By: Nelson Chimes M.D.   On: 11/03/2018 13:40   Ct Angio Neck W And/or Wo Contrast  Result Date: 11/03/2018 CLINICAL DATA:  Acute onset of chest pain today. Numbness of the left arm and hand. EXAM: CT ANGIOGRAPHY HEAD AND NECK TECHNIQUE: Multidetector CT imaging of the head and  neck was performed using the standard protocol during bolus administration of intravenous contrast. Multiplanar CT image reconstructions and MIPs were obtained to evaluate the vascular anatomy. Carotid stenosis measurements (when applicable) are obtained utilizing NASCET criteria, using the distal internal carotid diameter as the denominator. CONTRAST:  137mL OMNIPAQUE IOHEXOL 350 MG/ML SOLN COMPARISON:  Head CT earlier same day FINDINGS: CTA NECK FINDINGS Aortic arch: Arch is negative. Branching pattern is normal without stenosis. Right carotid system: Common carotid  artery widely patent to the bifurcation. No carotid bifurcation stenosis or irregularity. Minimal plaque in the ICA bulb. Left carotid system: Common carotid artery widely patent to the bifurcation. Mild atherosclerotic plaque at the carotid bifurcation and proximal external carotid. No internal carotid stenosis or irregularity. Vertebral arteries: Both vertebral artery origins are patent. Mild atherosclerotic plaque on the right but no flow limiting stenosis. On the left, there is 50% stenosis due to focal calcified plaque. Both vertebral arteries are patent through the cervical region to the foramen magnum. Skeleton: Degenerative cervical spondylosis. Prominent osteophytes/disc herniations at C4-5 and C5-6 that could cause symptomatic stenosis of the canal and or foramina. Other neck: Large right parotid mass involving both the deep lobe and superficial lobe extending inferior from the superficial lobe. This has been seen on multiple previous examinations and is not changing, consistent with benign nature. Axial measurements are unchanged since 2017 at 32 x 43 mm. Upper chest: Negative Review of the MIP images confirms the above findings CTA HEAD FINDINGS Anterior circulation: Both internal carotid arteries are patent through the skull base and siphon regions. The anterior and middle cerebral vessels are patent without proximal stenosis, aneurysm or  vascular malformation. No embolic occlusions are identified. Posterior circulation: Both vertebral arteries are widely patent through the foramen magnum to the basilar. The right is dominant. No basilar stenosis. Posterior circulation branch vessels are patent. Patent posterior communicating arteries on each side. Venous sinuses: Patent and normal. Anatomic variants: None significant. Review of the MIP images confirms the above findings IMPRESSION: No acute vascular finding to explain the clinical presentation. No sign of dissection or occlusion of the brachiocephalic or intracranial vessels. Mild atherosclerotic plaque as above but without flow limiting stenosis in the anterior circulation vessels. Posterior circulation vessels are patent. Atherosclerotic disease at both vertebral artery origins without measurable stenosis on the right and with focal 50% stenosis on the left. 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 Advanced cervical degenerative disease with spinal stenosis and foraminal stenosis at C4-5 and C5-6 that could be significant. Electronically Signed   By: Nelson Chimes M.D.   On: 11/03/2018 13:40   Mr Brain Wo Contrast  Result Date: 11/03/2018 CLINICAL DATA:  Left upper extremity weakness/numbness and chest pain. EXAM: MRI HEAD WITHOUT CONTRAST TECHNIQUE: Multiplanar, multiecho pulse sequences of the brain and surrounding structures were obtained without intravenous contrast. COMPARISON:  Head CT 11/03/2018 and MRI 06/30/2015 FINDINGS: Brain: A few subcentimeter acute infarcts are present in the posterior right frontal and right parietal lobes involving cortex and subcortical white matter. Small foci of T2 hyperintensity in the cerebral white matter bilaterally have mildly progressed from 2017 and are nonspecific but compatible with mildly age advanced chronic small vessel ischemic disease. The ventricles and sulci are  within normal limits for age. Vascular: Major intracranial vascular flow voids are preserved. Skull and upper cervical spine: Unremarkable bone marrow signal. Sinuses/Orbits: Unremarkable orbits. Minimal scattered mucosal thickening in the paranasal sinuses. Clear mastoid air cells. Other: Partially visualized large right parotid region mass as seen on multiple prior examinations. IMPRESSION: 1. Small acute right frontoparietal infarcts. 2. Mild chronic small vessel ischemic disease, mildly progressed from 2017. Electronically Signed   By: Logan Bores M.D.   On: 11/03/2018 18:37   Ct Coronary Morph W/cta Cor W/score W/ca W/cm &/or Wo/cm  Result Date: 11/03/2018 EXAM: OVER-READ INTERPRETATION  CT CHEST The following report is an over-read performed by radiologist Dr.  Rolm Baptise of [facility], [place] on 11/03/2018. This over-read does not include interpretation of cardiac or coronary anatomy or pathology. The coronary CTA interpretation by the cardiologist is attached. COMPARISON:  CTA chest earlier today FINDINGS: Vascular: There is intimal irregularity with filling defect extending into the lumen of the distal ascending thoracic aorta near the proximal aortic arch. This is stable since study earlier today. No evidence of thoracic aortic aneurysm. Mediastinum/Nodes: No mediastinal, hilar, or axillary adenopathy. Lungs/Pleura: Lungs clear.  No effusions. Upper Abdomen: Imaging into the upper abdomen shows no acute findings. Musculoskeletal: Scattered sclerotic densities seen in the thoracic spine as described on prior CT. IMPRESSION: Intimal irregularity with luminal filling defect in the proximal arch/distal ascending thoracic aorta, stable since prior study. Electronically Signed   By: Rolm Baptise M.D.   On: 11/03/2018 17:07   Dg Chest Portable 1 View  Result Date: 11/03/2018 CLINICAL DATA:  Pt states he woke up with chest pain this morning. He took 1 nitroglycerin which relieved the pain. One hour (  between 1030-1045) ago he reports numbness to L lower arm and hand., hx of TIA, HTN, no other complaints EXAM: PORTABLE CHEST 1 VIEW COMPARISON:  Chest radiographs dated 12/28 2019, 06/29/2015 FINDINGS: The heart size and mediastinal contours are within normal limits. The lungs are clear. No pneumothorax or pleural effusion. No acute abnormality in the visualized skeleton. IMPRESSION: Normal chest radiograph. Electronically Signed   By: Audie Pinto M.D.   On: 11/03/2018 11:44   Ct Angio Chest/abd/pel For Dissection W And/or Wo Contrast  Result Date: 11/03/2018 CLINICAL DATA:  58 year old male with acute chest and back pain as well as stroke-like symptoms concerning for dissection. EXAM: CT ANGIOGRAPHY CHEST, ABDOMEN AND PELVIS TECHNIQUE: Multidetector CT imaging through the chest, abdomen and pelvis was performed using the standard protocol during bolus administration of intravenous contrast. Multiplanar reconstructed images and MIPs were obtained and reviewed to evaluate the vascular anatomy. CONTRAST:  180mL OMNIPAQUE IOHEXOL 350 MG/ML SOLN COMPARISON:  Prior CTA of the chest abdomen and pelvis 03/06/2018 FINDINGS: CTA CHEST FINDINGS Cardiovascular: 2 vessel aortic arch. The right brachiocephalic and left common carotid artery share a common origin. On the unenhanced images, there is no evidence of intramural high attenuation to suggest intramural hematoma. Following administration of intravenous contrast, there is very subtle irregularity of the intima in the ascending thoracic aorta just proximal to the aortic arch. There is a linear irregular filling defect extending into the aortic lumen measuring approximately 1.4 cm in length by 0.2 mm in width. This almost certainly represents an area of linear thrombus adherent to the aortic wall. No evidence of aortic wall thickening, stranding in the mediastinal fat or hemorrhage. The remainder of the aortic arch and descending thoracic aorta are normal. The  aortic root is normal. The heart is normal in size. Calcifications are present along the left main, left anterior descending, circumflex and right coronary artery. No pericardial effusion. Mediastinum/Nodes: Unremarkable CT appearance of the thyroid gland. No suspicious mediastinal or hilar adenopathy. No soft tissue mediastinal mass. The thoracic esophagus is unremarkable. Lungs/Pleura: Lungs are clear. No pleural effusion or pneumothorax. Musculoskeletal: No acute fracture or aggressive appearing lytic or blastic osseous lesion. Small low-attenuation lesion just deep to the skin surface on the right anterior chest wall just above the nipple line likely represents a sebaceous or other benign cutaneous cyst. Review of the MIP images confirms the above findings. CTA ABDOMEN AND PELVIS FINDINGS VASCULAR Aorta: The aorta is normal in  caliber without evidence of aneurysm. Compared to the prior study from December of 2019, there has been significant interval progression of fibrofatty atherosclerotic plaque which is become irregular and ulcerated beginning at the aortic hiatus and extending through the visceral, renal and infrarenal segments of the aorta. Just below the renal arteries, the lumen is narrowed by approximately 70%. Celiac: Patent without evidence of aneurysm, dissection, vasculitis or significant stenosis. SMA: Patent without evidence of aneurysm, dissection, vasculitis or significant stenosis. Renals: Both renal arteries are patent without evidence of aneurysm, dissection, vasculitis, fibromuscular dysplasia or significant stenosis. IMA: Patent without evidence of aneurysm, dissection, vasculitis or significant stenosis. Inflow: Heterogeneous calcified and noncalcified atherosclerotic plaque without significant stenosis in either common iliac artery. No evidence of dissection. The left internal iliac artery is occluded at the origin but ultimately reconstitutes. The external iliac arteries are patent. Of  note, a large branch of the left profunda femoral artery is occluded, likely embolic. Veins: No focal venous abnormality. Review of the MIP images confirms the above findings. NON-VASCULAR Hepatobiliary: Normal morphology. Stable circumscribed low-attenuation lesions which almost certainly represent benign cysts. Gallbladder is unremarkable. No intra or extrahepatic biliary ductal dilatation. Pancreas: Unremarkable. No pancreatic ductal dilatation or surrounding inflammatory changes. Spleen: Normal in size without focal abnormality. Adrenals/Urinary Tract: Normal adrenal glands. No evidence of hydronephrosis, nephrolithiasis or enhancing renal mass. Stomach/Bowel: Stomach is within normal limits. Appendix appears normal. No evidence of bowel wall thickening, distention, or inflammatory changes. Lymphatic: No suspicious lymphadenopathy. Reproductive: Asymmetric enhancement of the right posterolateral prostate gland with patchy enhancement elsewhere. Findings are significantly more conspicuous compared to prior imaging. Other: No abdominal wall hernia or abnormality. No abdominopelvic ascites. Musculoskeletal: Multifocal sclerotic foci present throughout the skeleton. An index lesion in the left hemi sacrum measures slightly larger compared to the prior study at 0.9 cm compared to 0.8 cm previously. A second index lesion in the right transverse process of T1 measures 1.0 cm compared to 0.8 cm previously. Numerous additional lesions are present scattered throughout the thoracic and lumbar spine. These lesions are significantly smaller and not dramatically changed. Lesions are also identified in both iliac bones, bilateral ischial tuberosities, ribs and the right femoral head. The smaller of the 2 femoral head lesions also appears slightly enlarged. Review of the MIP images confirms the above findings. IMPRESSION: CTA CHEST 1. There is a subtle focal intimal irregularity in the ascending thoracic aorta with a 1.4 x 0.2  cm linear thrombus extending from the focus of irregularity into the aortic lumen. This is just proximal to the origin of the right brachiocephalic artery which supplies both carotid arteries. This lesion is at high risk for embolization which could cause additional large vessel occlusion in the cerebral vasculature. The intimal irregularity could represent the form fruste of an aortic dissection if the patient has significant hypertension although the abnormality is confined to the intima. There is no evidence of thickening or hemorrhage within the aortic media. 2. Left main and 3 vessel coronary artery disease. CTA ABD/PELVIS 1. No evidence of acute aortic dissection or aneurysm. 2. However, there has been significant interval progression of irregular/ulcerated hypoechoic plaque and likely wall adherent mural thrombus within the visceral, renal and infrarenal abdominal aorta. 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 3. 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 There is irregular patchy enhancement throughout the prostate gland more (more on  the right than the left) with numerous multifocal sclerotic bone lesions in the pelvis, right femoral head, lumbar and thoracic spine and ribs. At least a few of the small sclerotic foci have subtly enlarged compared to prior imaging. Recommend correlation with serum PSA and referral to urology. These results were called by telephone at the time of interpretation on 11/03/2018 at 1:11 pm to Dr. Sherwood Gambler , who verbally acknowledged these results. Signed, Criselda Peaches, MD, [facility] Electronically Signed   By: Jacqulynn Cadet M.D.   On: 11/03/2018 13:59   Ct Head Code Stroke Wo Contrast  Result Date: 11/03/2018 CLINICAL DATA:  Code stroke.  Left arm  numbness EXAM: CT HEAD WITHOUT CONTRAST TECHNIQUE: Contiguous axial images were obtained from the base of the skull through the vertex without intravenous contrast. COMPARISON:  06/30/2015 brain MRI FINDINGS: Brain: No evidence of acute infarction, hemorrhage, hydrocephalus, extra-axial collection or mass lesion/mass effect. Vascular: No hyperdense vessel or unexpected calcification. Skull: Normal. Negative for fracture or focal lesion. Sinuses/Orbits: No acute finding. Other: These results were called by telephone at the time of interpretation on 11/03/2018 at 11:39 am to Dr. Sherwood Gambler , who verbally acknowledged these results. ASPECTS [facility] Stroke Program Early CT Score) - Ganglionic level infarction (caudate, lentiform nuclei, internal capsule, insula, M1-M3 cortex): 7 - Supraganglionic infarction (M4-M6 cortex): 3 Total score (0-10 with 10 being normal): 10 IMPRESSION: Negative head CT.ASPECTS is 10. Electronically Signed   By: Monte Fantasia M.D.   On: 11/03/2018 11:41    EKG: Independently reviewed. It shows Normal sinus rhythm, left atrial enlargement, present of IVCD no significant ST-T  Assessment/Plan Principal Problem:   Acute cerebrovascular accident (CVA) ([place]) Active Problems:   Paresthesia   Essential hypertension   Hyponatremia   Hypokalemia   Tobacco abuse   Coronary artery disease involving native coronary artery of native heart with angina pectoris (HCC)   Hyperlipidemia LDL goal <70   Ischemic cardiomyopathy     #1 acute frontoparietal CVA on the right: Appears to be embolic probably from the mural thrombus in the ascending aorta and possible LV.  Patient will be admitted to progressive care.  Full dose heparin initiated.  Aspirin and statin.  Stroke team to follow with cardiology.  Further decision to be done.  #2 coronary artery disease: Stable at this point.  Patient is having no more chest pain now.  Troponin slightly elevated.  Defer to cardiology  #3  essential hypertension: Continue blood pressure control  #4 hyponatremia: Monitor sodium level  #5 tobacco abuse: Add nicotine patch  #6 possible prostate cancer: Incidental finding on CT scan.  Discussed with patient who apparently was aware of possibility of prostate disease since last year.  Has not seen a urologist and has not been evaluated.  Patient most likely will require urology consultation while in the hospital.  At least he will require some biopsy with elevated PSA and demonstrable possible bone metastasis.  #7 hyperlipidemia: Statin will be continued with.  DVT prophylaxis: Heparin drip Code Status: Full code Family Communication: No family bedside Disposition Plan: To be determined Consults called: Dr. Ricard Dillon CV surgery, Dr. Malen Gauze neurology, patient will need cardiology consult in the morning Admission status: Inpatient  Severity of Illness: The appropriate patient status for this patient is INPATIENT. Inpatient status is judged to be reasonable and necessary in order to provide the required intensity of service to ensure the patient's safety. The patient's presenting symptoms, physical  exam findings, and initial radiographic and laboratory data in the context of their chronic comorbidities is felt to place them at high risk for further clinical deterioration. Furthermore, it is not anticipated that the patient will be medically stable for discharge from the hospital within 2 midnights of admission. The following factors support the patient status of inpatient.   " The patient's presenting symptoms include paresthesia and numbness of the left side. " The worrisome physical exam findings include no significant finding on exam. " The initial radiographic and laboratory data are worrisome because of MRI showing acute CVA. " The chronic co-morbidities include coronary artery disease.   * I certify that at the point of admission it is my clinical judgment that the patient will  require inpatient hospital care spanning beyond 2 midnights from the point of admission due to high intensity of service, high risk for further deterioration and high frequency of surveillance required.Barbette Merino MD [facility] Pager [phone]  If 7PM-7AM, please contact night-coverage [email or web address] Password [email or web address]  11/03/2018, 6:56 PM

## 2018-11-03 NOTE — Consult Note (Addendum)
[address]       [place] [address]             [phone]          CARDIOTHORACIC SURGERY CONSULTATION REPORT  PCP is Patient, No Pcp Per Referring Provider is Sherwood Gambler, MD Primary Cardiologist is Glenetta Hew, MD  Reason for consultation:  Abnormal CTA of aorta  HPI:  Patient is a 58 year old African-American male with history of coronary artery disease status post PCI and stenting of the left anterior descending coronary artery following acute myocardial infarction in December 2019, ischemic cardiomyopathy, left bundle branch block, hypertension, remote history of TIA, and history of Warthin's tumor who presented acutely to the emergency department at [facility] with symptoms of left arm numbness and a brief episode of chest pain and has been referred for surgical consultation due to abnormality noted on CT angiogram of the chest.  Patient states he was in his usual state of health until approximately 3 AM this morning when he developed substernal chest pain associated with left arm numbness.  He took sublingual nitroglycerin and symptoms improved.  Later this morning numbness in the left arm resumed without chest pain, ultimately prompting the patient to present to the emergency department at [facility].  Initially code stroke was called but because of symptoms of chest pain CTA of the chest was obtained to rule out aortic dissection.  CT angiogram of the chest was reported to demonstrate possible thrombus in the ascending aorta.  Cardiothoracic surgical consultation was requested and the patient transported to [facility] for cardiac gated CT angiogram of the heart and ascending aorta.  Patient denies ongoing chest pain.  He has not had any previous episodes of chest pain recently, dating back to the acute myocardial infarction he suffered last December.  He has been followed carefully by Dr. Ellyn Hack and  colleagues and has been taking dual antiplatelet therapy including aspirin and Brilinta on a daily basis.  He denies symptoms of exertional chest pain or shortness of breath.  He reports mild residual numbness in his left hand and forearm.  He has no ongoing chest pain.  He has no shortness of breath.  He has no visual disturbances or other neurologic symptoms at present.  Past Medical History:  Diagnosis Date   Abnormal chest CT    a. Ectatic 3 cm Ao arch - rec f/u outpt imaging w/ CTA or MRA. Ectatic atheromatous abd Ao @ risk for aneurysm - rec f/u u/s in 5 yrs. Marked prostatic enlargement w/ scattered small sclerotic foci in the thoracic lower lumbar spine, sacrum, left 12th rib, pelvis, and right femur.  Recommend elective outpatient whole-body bone scintigraphy and PSA.   CAD (coronary artery disease)    a. 02/2018 ACS/PCI: LM nl, LAD 85p (3.5x15 Sierra DES), D1 45ost, RI 55ost, RCA nl, EF 25-35%.   Complete left bundle branch block (LBBB) 2016   Current every day smoker    HFrEF (heart failure with reduced ejection fraction) ([place])    Hypertension    Ischemic cardiomyopathy 02/2018   a. 02/2018 Echo: EF 35-40% (LV gram on cath was 25-30%), mod, diff HK. mid-apicalanteroseptal, ant, and apical sev HK. RVH.   TIA (transient ischemic attack) 2016   Warthin's tumor     Past Surgical History:  Procedure Laterality Date   CORONARY STENT INTERVENTION N/A 03/06/2018   Procedure: CORONARY STENT INTERVENTION;  Surgeon: Glenetta Hew  W, MD;  Location: [place] [facility];  Service: Cardiovascular: Proximal LAD 85% stenosis (and D1): DES PCI with Xience Sierra DES 3.5 mm x 15 mm - 3.9 mm   INTRAOPERATIVE TRANSTHORACIC ECHOCARDIOGRAM  03/06/2018   (Peri-MI) mild reduced EF 35-40%.  Diffuse hypokinesis (severe HK of the mid-apical anteroseptal, anterior and apical myocardium consistent with LAD infarct).  Unable to assess diastolic function.  Paradoxical septal motion likely related to his  LBBB.  RV hypertrophy noted.  Trivial pericardial effusion noted.    LEFT HEART CATH AND CORONARY ANGIOGRAPHY N/A 03/06/2018   Procedure: LEFT HEART CATH AND CORONARY ANGIOGRAPHY;  Surgeon: [name], MD;  Location: [place] [facility];  Service: Cardiovascular: Proximal LAD 85% involving D1 45%.  Ostial RI 55%.  Severe LV dysfunction EF 25 to 30%.  1+ MR.  Only minimally elevated LVEDP.    Family History  Problem Relation Age of Onset   Stroke Mother    Hypertension Mother    Diabetes type II Mother    Leukemia Other    Breast cancer Other    CAD Neg Hx     Social History   Socioeconomic History   Marital status: Married    Spouse name: Not on file   Number of children: Not on file   Years of education: Not on file   Highest education level: Not on file  Occupational History   Not on file  Social Needs   Financial resource strain: Not on file   Food insecurity    Worry: Not on file    Inability: Not on file   Transportation needs    Medical: Not on file    Non-medical: Not on file  Tobacco Use   Smoking status: Current Every Day Smoker    Packs/day: 0.50    Types: Cigarettes   Smokeless tobacco: Former Systems developer    Quit date: 02/23/2018  Substance and Sexual Activity   Alcohol use: Yes    Alcohol/week: 2.0 standard drinks    Types: 2 Cans of beer per week    Comment: 2 Beers daily.   Drug use: Yes    Frequency: 3.0 times per week    Types: Marijuana   Sexual activity: Not on file  Lifestyle   Physical activity    Days per week: Not on file    Minutes per session: Not on file   Stress: Not on file  Relationships   Social connections    Talks on phone: Not on file    Gets together: Not on file    Attends religious service: Not on file    Active member of club or organization: Not on file    Attends meetings of clubs or organizations: Not on file    Relationship status: Not on file   Intimate partner violence    Fear of current or  ex partner: Not on file    Emotionally abused: Not on file    Physically abused: Not on file    Forced sexual activity: Not on file  Other Topics Concern   Not on file  Social History Narrative   Not on file    Prior to Admission medications   Medication Sig Start Date End Date Taking? Authorizing Provider  acetaminophen (TYLENOL) 325 MG tablet Take 2 tablets (650 mg total) by mouth every 4 (four) hours as needed for mild pain (temperature >/= 99.5 F). 07/08/14  Yes Ahmed, Chesley Mires, MD  aspirin EC 81 MG EC tablet Take  1 tablet (81 mg total) by mouth daily. 07/08/14  Yes Ahmed, Chesley Mires, MD  atorvastatin (LIPITOR) 80 MG tablet Take 1 tablet (80 mg total) by mouth daily. 03/10/18 11/03/18 Yes Theora Gianotti, NP  carvedilol (COREG) 12.5 MG tablet Take 1 tablet (12.5 mg total) by mouth 2 (two) times daily with a meal. 06/25/18  Yes [name], MD  ticagrelor (BRILINTA) 90 MG TABS tablet Take 1 tablet (90 mg total) by mouth 2 (two) times daily. 03/10/18  Yes Theora Gianotti, NP  lisinopril (PRINIVIL,ZESTRIL) 10 MG tablet Take 1 tablet (10 mg total) by mouth daily. 03/26/18 06/25/18  [name], MD  nitroGLYCERIN (NITROSTAT) 0.4 MG SL tablet Place 1 tablet (0.4 mg total) under the tongue every 5 (five) minutes as needed for chest pain. 03/10/18   Theora Gianotti, NP    Current Facility-Administered Medications  Medication Dose Route Frequency Provider Last Rate Last Dose   atorvastatin (LIPITOR) tablet 80 mg  80 mg Oral Once Sherwood Gambler, MD       Current Outpatient Medications  Medication Sig Dispense Refill   acetaminophen (TYLENOL) 325 MG tablet Take 2 tablets (650 mg total) by mouth every 4 (four) hours as needed for mild pain (temperature >/= 99.5 F). 90 tablet 0   aspirin EC 81 MG EC tablet Take 1 tablet (81 mg total) by mouth daily. 90 tablet 0   atorvastatin (LIPITOR) 80 MG tablet Take 1 tablet (80 mg total) by mouth daily. 30 tablet 6   carvedilol  (COREG) 12.5 MG tablet Take 1 tablet (12.5 mg total) by mouth 2 (two) times daily with a meal. 180 tablet 3   ticagrelor (BRILINTA) 90 MG TABS tablet Take 1 tablet (90 mg total) by mouth 2 (two) times daily. 60 tablet 6   lisinopril (PRINIVIL,ZESTRIL) 10 MG tablet Take 1 tablet (10 mg total) by mouth daily. 90 tablet 3   nitroGLYCERIN (NITROSTAT) 0.4 MG SL tablet Place 1 tablet (0.4 mg total) under the tongue every 5 (five) minutes as needed for chest pain. 25 tablet 3    No Known Allergies    Review of Systems:  Per HPI Remainder non-contributory     Physical Exam:   BP (!) 139/97    Pulse 70    Temp 99 F (37.2 C) (Oral)    Resp 20    Wt 59.4 kg    SpO2 99%    BMI 17.28 kg/m   General:   well-appearing  HEENT:  Unremarkable   Neck:   no JVD, no bruits, no adenopathy   Chest:   clear to auscultation, symmetrical breath sounds, no wheezes, no rhonchi   CV:   RRR, no murmur   Abdomen:  soft, non-tender, no masses   Extremities:  warm, well-perfused, pulses palpable, no lower extremity edema  Rectal/GU  Deferred  Neuro:   Grossly non-focal and symmetrical throughout other than reported numbness left hand  Skin:   Clean and dry, no rashes, no breakdown  Diagnostic Tests:  Lab Results: Recent Labs    11/03/18 1140  WBC 8.0  HGB 10.6*  HCT 32.6*  PLT 467*   BMET:  Recent Labs    11/03/18 1140  NA 134*  K 3.8  CL 100  CO2 24  GLUCOSE 107*  BUN 5*  CREATININE 0.77  CALCIUM 8.6*    CBG (last 3)  Recent Labs    11/03/18 1123  GLUCAP 114*   PT/INR:   Recent Labs  11/03/18 1140  LABPROT 13.0  INR 1.0    EKG: NSR w/out acute ischemic changes although IVCD c/w atypical LBBB    CXR:  PORTABLE CHEST 1 VIEW  COMPARISON:  Chest radiographs dated 12/28 2019, 06/29/2015  FINDINGS: The heart size and mediastinal contours are within normal limits. The lungs are clear. No pneumothorax or pleural effusion. No acute abnormality in the visualized  skeleton.  IMPRESSION: Normal chest radiograph.   Electronically Signed   By: Audie Pinto M.D.   On: 11/03/2018 11:44     CT ANGIOGRAPHY CHEST, ABDOMEN AND PELVIS  TECHNIQUE: Multidetector CT imaging through the chest, abdomen and pelvis was performed using the standard protocol during bolus administration of intravenous contrast. Multiplanar reconstructed images and MIPs were obtained and reviewed to evaluate the vascular anatomy.  CONTRAST:  157mL OMNIPAQUE IOHEXOL 350 MG/ML SOLN  COMPARISON:  Prior CTA of the chest abdomen and pelvis 03/06/2018  FINDINGS: CTA CHEST FINDINGS  Cardiovascular: 2 vessel aortic arch. The right brachiocephalic and left common carotid artery share a common origin. On the unenhanced images, there is no evidence of intramural high attenuation to suggest intramural hematoma. Following administration of intravenous contrast, there is very subtle irregularity of the intima in the ascending thoracic aorta just proximal to the aortic arch. There is a linear irregular filling defect extending into the aortic lumen measuring approximately 1.4 cm in length by 0.2 mm in width. This almost certainly represents an area of linear thrombus adherent to the aortic wall. No evidence of aortic wall thickening, stranding in the mediastinal fat or hemorrhage. The remainder of the aortic arch and descending thoracic aorta are normal. The aortic root is normal. The heart is normal in size. Calcifications are present along the left main, left anterior descending, circumflex and right coronary artery. No pericardial effusion.  Mediastinum/Nodes: Unremarkable CT appearance of the thyroid gland. No suspicious mediastinal or hilar adenopathy. No soft tissue mediastinal mass. The thoracic esophagus is unremarkable.  Lungs/Pleura: Lungs are clear. No pleural effusion or pneumothorax.  Musculoskeletal: No acute fracture or aggressive appearing lytic  or blastic osseous lesion. Small low-attenuation lesion just deep to the skin surface on the right anterior chest wall just above the nipple line likely represents a sebaceous or other benign cutaneous cyst.  Review of the MIP images confirms the above findings.  CTA ABDOMEN AND PELVIS FINDINGS  VASCULAR  Aorta: The aorta is normal in caliber without evidence of aneurysm. Compared to the prior study from December of 2019, there has been significant interval progression of fibrofatty atherosclerotic plaque which is become irregular and ulcerated beginning at the aortic hiatus and extending through the visceral, renal and infrarenal segments of the aorta. Just below the renal arteries, the lumen is narrowed by approximately 70%.  Celiac: Patent without evidence of aneurysm, dissection, vasculitis or significant stenosis.  SMA: Patent without evidence of aneurysm, dissection, vasculitis or significant stenosis.  Renals: Both renal arteries are patent without evidence of aneurysm, dissection, vasculitis, fibromuscular dysplasia or significant stenosis.  IMA: Patent without evidence of aneurysm, dissection, vasculitis or significant stenosis.  Inflow: Heterogeneous calcified and noncalcified atherosclerotic plaque without significant stenosis in either common iliac artery. No evidence of dissection. The left internal iliac artery is occluded at the origin but ultimately reconstitutes. The external iliac arteries are patent. 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  Veins: No focal venous abnormality.  Review of the MIP images confirms the above findings.  NON-VASCULAR  Hepatobiliary: Normal morphology. Stable circumscribed low-attenuation lesions which almost certainly represent benign cysts. Gallbladder is unremarkable. No intra or extrahepatic biliary ductal dilatation.  Pancreas: Unremarkable. No pancreatic  ductal dilatation or surrounding inflammatory changes.  Spleen: Normal in size without focal abnormality.  Adrenals/Urinary Tract: Normal adrenal glands. No evidence of hydronephrosis, nephrolithiasis or enhancing renal mass.  Stomach/Bowel: Stomach is within normal limits. Appendix appears normal. No evidence of bowel wall thickening, distention, or inflammatory changes.  Lymphatic: No suspicious lymphadenopathy.  Reproductive: Asymmetric enhancement of the right posterolateral prostate gland with patchy enhancement elsewhere. Findings are significantly more conspicuous compared to prior imaging.  Other: No abdominal wall hernia or abnormality. No abdominopelvic ascites.  Musculoskeletal: Multifocal sclerotic foci present throughout the skeleton. An index lesion in the left hemi sacrum measures slightly larger compared to the prior study at 0.9 cm compared to 0.8 cm previously. A second index lesion in the right transverse process of T1 measures 1.0 cm compared to 0.8 cm previously. Numerous additional lesions are present scattered throughout the thoracic and lumbar spine. These lesions are significantly smaller and not dramatically changed. Lesions are also identified in both iliac bones, bilateral ischial tuberosities, ribs and the right femoral head. The smaller of the 2 femoral head lesions also appears slightly enlarged.  Review of the MIP images confirms the above findings.  IMPRESSION: CTA CHEST  1. 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 This is just proximal to the origin of the right brachiocephalic artery which supplies both carotid arteries. This lesion is at high risk for embolization which could cause additional large vessel occlusion in the cerebral vasculature. The intimal irregularity could represent the form  fruste of an aortic dissection if the patient has significant hypertension although the abnormality is confined to the intima. There is no evidence of thickening or hemorrhage within the aortic media. 2. Left main and 3 vessel coronary artery disease.  CTA ABD/PELVIS  1. No evidence of acute aortic dissection or aneurysm. 2. However, there has been significant interval progression of irregular/ulcerated hypoechoic plaque and likely wall adherent mural thrombus within the visceral, renal and infrarenal abdominal aorta. 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 3. 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 There is irregular patchy enhancement throughout the prostate gland more (more on the right than the left) with numerous multifocal sclerotic bone lesions in the pelvis, right femoral head, lumbar and thoracic spine and ribs. At least a few of the small sclerotic foci have subtly enlarged compared to prior imaging. Recommend correlation with serum PSA and referral to urology.  These results were called by telephone at the time of interpretation on 11/03/2018 at 1:11 pm to Dr. Sherwood Gambler , who verbally acknowledged these results.  Signed,  Criselda Peaches, MD, [place]  [facility]  [facility]   Electronically Signed   By: Jacqulynn Cadet M.D.   On: 11/03/2018 13:59     CT ANGIOGRAPHY HEAD AND NECK  TECHNIQUE: Multidetector CT imaging of the head and neck was performed using the standard protocol during bolus administration of intravenous contrast. Multiplanar CT image reconstructions and MIPs were obtained to evaluate the vascular anatomy. Carotid stenosis measurements (when applicable) are obtained utilizing NASCET criteria, using the  distal internal  carotid diameter as the denominator.  CONTRAST:  170mL OMNIPAQUE IOHEXOL 350 MG/ML SOLN  COMPARISON:  Head CT earlier same day  FINDINGS: CTA NECK FINDINGS  Aortic arch: Arch is negative. Branching pattern is normal without stenosis.  Right carotid system: Common carotid artery widely patent to the bifurcation. No carotid bifurcation stenosis or irregularity. Minimal plaque in the ICA bulb.  Left carotid system: Common carotid artery widely patent to the bifurcation. Mild atherosclerotic plaque at the carotid bifurcation and proximal external carotid. No internal carotid stenosis or irregularity.  Vertebral arteries: Both vertebral artery origins are patent. Mild atherosclerotic plaque on the right but no flow limiting stenosis. On the left, there is 50% stenosis due to focal calcified plaque. Both vertebral arteries are patent through the cervical region to the foramen magnum.  Skeleton: Degenerative cervical spondylosis. Prominent osteophytes/disc herniations at C4-5 and C5-6 that could cause symptomatic stenosis of the canal and or foramina.  Other neck: Large right parotid mass involving both the deep lobe and superficial lobe extending inferior from the superficial lobe. This has been seen on multiple previous examinations and is not changing, consistent with benign nature. Axial measurements are unchanged since 2017 at 32 x 43 mm.  Upper chest: Negative  Review of the MIP images confirms the above findings  CTA HEAD FINDINGS  Anterior circulation: Both internal carotid arteries are patent through the skull base and siphon regions. The anterior and middle cerebral vessels are patent without proximal stenosis, aneurysm or vascular malformation. No embolic occlusions are identified.  Posterior circulation: Both vertebral arteries are widely patent through the foramen magnum to the basilar. The right is dominant. No basilar stenosis. Posterior  circulation branch vessels are patent. Patent posterior communicating arteries on each side.  Venous sinuses: Patent and normal.  Anatomic variants: None significant.  Review of the MIP images confirms the above findings  IMPRESSION: No acute vascular finding to explain the clinical presentation. No sign of dissection or occlusion of the brachiocephalic or intracranial vessels.  Mild atherosclerotic plaque as above but without flow limiting stenosis in the anterior circulation vessels. Posterior circulation vessels are patent. Atherosclerotic disease at both vertebral artery origins without measurable stenosis on the right and with focal 50% stenosis on the left.  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  Advanced cervical degenerative disease with spinal stenosis and foraminal stenosis at C4-5 and C5-6 that could be significant.   Electronically Signed   By: Nelson Chimes M.D.   On: 11/03/2018 13:40    OVER-READ INTERPRETATION  CT CHEST  The following report is an over-read performed by radiologist Dr. Rolm Baptise of [facility], [place] on 11/03/2018. This over-read does not include interpretation of cardiac or coronary anatomy or pathology. The coronary CTA interpretation by the cardiologist is attached.  COMPARISON:  CTA chest earlier today  FINDINGS: Vascular: There is intimal irregularity with filling defect extending into the lumen of the distal ascending thoracic aorta near the proximal aortic arch. This is stable since study earlier today. No evidence of thoracic aortic aneurysm.  Mediastinum/Nodes: No mediastinal, hilar, or axillary adenopathy.  Lungs/Pleura: Lungs clear.  No effusions.  Upper Abdomen: Imaging into the upper abdomen shows no acute findings.  Musculoskeletal: Scattered sclerotic densities seen in the thoracic spine as described on prior  CT.  IMPRESSION: Intimal irregularity with luminal filling defect in the proximal arch/distal ascending thoracic aorta, stable since prior study.  Electronically Signed   By: Rolm Baptise M.D.   On: 11/03/2018 17:07    MRI HEAD WITHOUT CONTRAST  TECHNIQUE: Multiplanar, multiecho pulse sequences of the brain and surrounding structures were obtained without intravenous contrast.  COMPARISON:  Head CT 11/03/2018 and MRI 06/30/2015  FINDINGS: Brain: A few subcentimeter acute infarcts are present in the posterior right frontal and right parietal lobes involving cortex and subcortical white matter. Small foci of T2 hyperintensity in the cerebral white matter bilaterally have mildly progressed from 2017 and are nonspecific but compatible with mildly age advanced chronic small vessel ischemic disease. The ventricles and sulci are within normal limits for age.  Vascular: Major intracranial vascular flow voids are preserved.  Skull and upper cervical spine: Unremarkable bone marrow signal.  Sinuses/Orbits: Unremarkable orbits. Minimal scattered mucosal thickening in the paranasal sinuses. Clear mastoid air cells.  Other: Partially visualized large right parotid region mass as seen on multiple prior examinations.  IMPRESSION: 1. Small acute right frontoparietal infarcts. 2. Mild chronic small vessel ischemic disease, mildly progressed from 2017.   Electronically Signed   By: Logan Bores M.D.   On: 11/03/2018 18:37    Impression:  Patient presents with a brief episode of chest pain early this morning relieved by sublingual nitroglycerin and subsequent acute onset numbness of the left upper extremity which has improved but has not completely resolved.  MRI of the brain confirms evidence of small acute right frontal parietal infarcts.  I have personally reviewed the patient's CT angiogram of the chest, abdomen, and pelvis as well as the follow-up cardiac gated  CT angiogram of the heart.  There is an unusual filling defect in the distal ascending thoracic aorta which could represent spontaneous thrombus although no clear attachment to the aortic wall can be visualized and the location would be very atypical (more commonly seen just beyond the takeoff of the left subclavian artery).  Whether or not this could be some type of radiographic artifact is unclear but it was not seen on CTA performed 03/06/2018.  There is no evidence for an aortic dissection and there is no significant atherosclerotic disease in this portion of the aorta.  There is some evidence of atherosclerotic disease in the abdominal aorta as well as occlusion of the left internal iliac artery and terminal branches of the left profunda femoris artery, potentially consistent with additional embolic events.  There is no evidence of mural thrombus in the left ventricle and there is no sign of clot in the left atrial appendage.   Plan:  There is no indication for emergent surgical intervention at this time although the underlying cause of the patient's presumed embolic events remain unclear and could potentially be related to the filling defect in the ascending aorta if this indeed represents spontaneous thrombus.   I recommend anticoagulation using intravenous heparin for the time being if the risk of hemorrhagic transformation of the patient's presumed acute stroke is felt to be low by the neurology team.  I would hold Brilinta indefinitely until long-term definitive plan for treatment has been established.  I recommend Cardiology Consult given the patient's reported episode of chest pain and previous cardiac history.  We will continue to follow.   I spent in excess of 90 minutes during the conduct of this hospital consultation and >50% of this time involved direct face-to-face encounter for counseling and/or coordination of the patient's care.    Valentina Gu. Roxy Manns, MD 11/03/2018 5:13 PM

## 2018-11-03 NOTE — ED Notes (Signed)
Pt offered TPA by teleneurologist. They agreed we would watch symptoms while pt still within the window and make a decision prior to 15:15

## 2018-11-03 NOTE — Consult Note (Signed)
[facility] Tele[facility]urology Consult Services   Date of Service:   11/03/2018 13:07:37  Impression:     .  Rule Out Acute Ischemic Stroke     .  Transient Ischemic Attack     .  Acute left lower facial weakness (improved)  Comments/Sign-Out: 58 year old male brought by private transportation with symptoms of left arm numbness and chest pain (at 3AM; he took nitro and it resolved). At 10:45AM, the left arm numbness began. Prior stroke and MI, smoker. Better but still ongoing. Past medical history: hypertension, hyperlipidemia and coronary artery disease. He is not on anticoagulation but does take ticagrelor and ASA. A 2nd stroke alert was initiated at this time after a CTA of the chest showed a thrombus and to discuss whether tPA would still be warranted in the setting of mild left lower facial weakness and left sided sensory loss. On my exam, his facial weakness resolved.   NIHSS 1: mild decrease sensation in the left hand compared to right hand.  Mechanism of Stroke: Possible Thromboembolic Possible Cardioembolic Small Vessel Disease  Metrics: Last Known Well: 11/03/2018 10:45:43 [facility] Notification Time: 11/03/2018 13:07:37 Stamp Time: 11/03/2018 13:07:37 Time First Login Attempt: 11/03/2018 13:11:07 Video Start Time: 11/03/2018 13:11:07  Symptoms: left arm numbness NIHSS Start Assessment Time: 11/03/2018 13:15:55 Patient is not a candidate for tPA. Patient was not deemed candidate for tPA thrombolytics because of Resolved symptoms (no residual disabling symptoms).  CT head showed no acute hemorrhage or acute core infarct.  Clinical Presentation is Suggestive of Large Vessel Occlusive Disease, Recommendations are as Follows  Our recommendations are outlined below.  Recommendations:     .  Activate Stroke Protocol Admission/Order Set     .  Stroke/Telemetry Floor     .  Neuro Checks     .  Bedside Swallow Eval     .  DVT Prophylaxis     .  IV  Fluids, Normal Saline     .  Head of Bed 30 Degrees     .  Euglycemia and Avoid Hyperthermia (PRN Acetaminophen)     .  Antiplatelet Therapy Recommended     .  Vascular surgery consultation (STAT)    Sign Out:     .  Discussed with Emergency Department Provider    ------------------------------------------------------------------------------  History of Present Illness: Patient is a 58 year old Male.  Inpatient stroke alert was called for symptoms of left arm numbness  58 year old male brought by private transportation with symptoms of left arm numbness and chest pain (at 3AM; he took nitro and it resolved). At 10:45AM, the left arm numbness began. Prior stroke and MI, smoker. Better but still ongoing. Past medical history: hypertension, hyperlipidemia and coronary artery disease. He is not on anticoagulation but does take ticagrelor and ASA. A 2nd stroke alert was initiated at this time after a CTA of the chest showed a thrombus.    Last seen normal was within 4.5 hours. There is no history of hemorrhagic complications or intracranial hemorrhage. There is no history of Recent Anticoagulants. There is no history of recent major surgery. There is no history of recent stroke.   Examination: 1A: Level of Consciousness - Alert; keenly responsive + 0 1B: Ask Month and Age - Both Questions Right + 0 1C: Blink Eyes & Squeeze Hands - Performs Both Tasks + 0 2: Test Horizontal Extraocular Movements - Normal + 0 3: Test Visual Fields - No Visual Loss + 0 4: Test Facial  Palsy (Use Grimace if Obtunded) - Normal symmetry + 0 5A: Test Left Arm Motor Drift - No Drift for 10 Seconds + 0 5B: Test Right Arm Motor Drift - No Drift for 10 Seconds + 0 6A: Test Left Leg Motor Drift - No Drift for 5 Seconds + 0 6B: Test Right Leg Motor Drift - No Drift for 5 Seconds + 0 7: Test Limb Ataxia (FNF/Heel-Shin) - No Ataxia + 0 8: Test Sensation - Mild-Moderate Loss: Less Sharp/More Dull + 1 9: Test  Language/Aphasia - Normal; No aphasia + 0 10: Test Dysarthria - Normal + 0 11: Test Extinction/Inattention - No abnormality + 0  NIHSS Score: 1  Patient/Family was informed the Neurology Consult would happen via TeleHealth consult by way of interactive audio and video telecommunications and consented to receiving care in this manner.   Due to the immediate potential for life-threatening deterioration due to underlying acute neurologic illness, I spent 35 minutes providing critical care. This time includes time for face to face visit via telemedicine, review of medical records, imaging studies and discussion of findings with providers, the patient and/or family.   Dr Ellin Goodie   [facility] [phone]  Case [id number]

## 2018-11-03 NOTE — ED Notes (Signed)
ED TO INPATIENT HANDOFF REPORT  ED Nurse Name and Phone #: Jenny Reichmann [phone]  S Name/Age/Gender Daniel Weiss 58 y.o. male Room/Bed: MH02/MH02  Code Status   Code Status: Prior  Home/SNF/Other Home Patient oriented to: self, place, time and situation Is this baseline? Yes   Triage Complete: Triage complete  Chief Complaint CHEST PAIN, LEFT ARM NUMBNESS  Triage Note Pt states he woke up with chest pain this morning. He took 1 nitroglycerin which relieved the pain. One hour ( between 1030-1045) ago he reports numbness to L lower arm and hand.    Allergies No Known Allergies  Level of Care/Admitting Diagnosis ED Disposition    ED Disposition Condition Comment   Admit  The patient appears reasonably stabilized for admission considering the current resources, flow, and capabilities available in the ED at this time, and I doubt any other [facility] requiring further screening and/or treatment in the ED prior to admission is  present.       B Medical/Surgery History Past Medical History:  Diagnosis Date  . Abnormal chest CT    a. Ectatic 3 cm Ao arch - rec f/u outpt imaging w/ CTA or MRA. Ectatic atheromatous abd Ao @ risk for aneurysm - rec f/u u/s in 5 yrs. Marked prostatic enlargement w/ scattered small sclerotic foci in the thoracic lower lumbar spine, sacrum, left 12th rib, pelvis, and right femur.  Recommend elective outpatient whole-body bone scintigraphy and PSA.  Marland Kitchen CAD (coronary artery disease)    a. 02/2018 ACS/PCI: LM nl, LAD 85p (3.5x15 Sierra DES), D1 45ost, RI 55ost, RCA nl, EF 25-35%.  . Complete left bundle branch block (LBBB) 2016  . Current every day smoker   . HFrEF (heart failure with reduced ejection fraction) ([place])   . Hypertension   . Ischemic cardiomyopathy 02/2018   a. 02/2018 Echo: EF 35-40% (LV gram on cath was 25-30%), mod, diff HK. mid-apicalanteroseptal, ant, and apical sev HK. RVH.  Marland Kitchen TIA (transient ischemic attack) 2016  . Warthin's tumor    Past  Surgical History:  Procedure Laterality Date  . CORONARY STENT INTERVENTION N/A 03/06/2018   Procedure: CORONARY STENT INTERVENTION;  Surgeon: [name], MD;  Location: [place] [facility];  Service: Cardiovascular: Proximal LAD 85% stenosis (and D1): DES PCI with Xience Sierra DES 3.5 mm x 15 mm - 3.9 mm  . INTRAOPERATIVE TRANSTHORACIC ECHOCARDIOGRAM  03/06/2018   (Peri-MI) mild reduced EF 35-40%.  Diffuse hypokinesis (severe HK of the mid-apical anteroseptal, anterior and apical myocardium consistent with LAD infarct).  Unable to assess diastolic function.  Paradoxical septal motion likely related to his LBBB.  RV hypertrophy noted.  Trivial pericardial effusion noted.   Marland Kitchen LEFT HEART CATH AND CORONARY ANGIOGRAPHY N/A 03/06/2018   Procedure: LEFT HEART CATH AND CORONARY ANGIOGRAPHY;  Surgeon: [name], MD;  Location: [place] [facility];  Service: Cardiovascular: Proximal LAD 85% involving D1 45%.  Ostial RI 55%.  Severe LV dysfunction EF 25 to 30%.  1+ MR.  Only minimally elevated LVEDP.     A IV Location/Drains/Wounds Patient Lines/Drains/Airways Status   Active Line/Drains/Airways    Name:   Placement date:   Placement time:   Site:   Days:   Peripheral IV 11/03/18 Left Forearm   11/03/18    1143    Forearm   less than 1   Peripheral IV 11/03/18 Left Wrist   11/03/18    1310    Wrist   less than 1  Intake/Output Last 24 hours No intake or output data in the 24 hours ending 11/03/18 1425  Labs/Imaging Results for orders placed or performed during the hospital encounter of 11/03/18 (from the past 48 hour(s))  CBG monitoring, ED     Status: Abnormal   Collection Time: 11/03/18 11:23 AM  Result Value Ref Range   Glucose-Capillary 114 (H) 70 - 99 mg/dL   Comment 1 Notify RN    Comment 2 Document in Chart   Ethanol     Status: None   Collection Time: 11/03/18 11:40 AM  Result Value Ref Range   Alcohol, Ethyl (B)        <10 mg/dL    Comment: LOWEST DETECTABLE  LIMIT FOR SERUM ALCOHOL IS 10 mg/dL FOR MEDICAL PURPOSES ONLY (NOTE) Lowest detectable limit for serum alcohol is 10 mg/dL. For medical purposes only. Performed at [facility], [address] [place], [place] [address]   Protime-INR     Status: None   Collection Time: 11/03/18 11:40 AM  Result Value Ref Range   Prothrombin Time 13.0 11.4 - 15.2 seconds   INR 1.0 0.8 - 1.2    Comment: (NOTE) INR goal varies based on device and disease states. Performed at [facility], [address] [place], [place] [address]   APTT     Status: None   Collection Time: 11/03/18 11:40 AM  Result Value Ref Range   aPTT 27 24 - 36 seconds    Comment: Performed at [facility], [address] [place], [place] [address]  CBC     Status: Abnormal   Collection Time: 11/03/18 11:40 AM  Result Value Ref Range   WBC 8.0 4.0 - 10.5 K/uL   RBC 4.02 (L) 4.22 - 5.81 MIL/uL   Hemoglobin 10.6 (L) 13.0 - 17.0 g/dL   HCT 32.6 (L) 39.0 - 52.0 %   MCV 81.1 80.0 - 100.0 fL   MCH 26.4 26.0 - 34.0 pg   MCHC 32.5 30.0 - 36.0 g/dL   RDW 16.8 (H) 11.5 - 15.5 %   Platelets 467 (H) 150 - 400 K/uL   nRBC 0.0 0.0 - 0.2 %    Comment: Performed at [facility], [address] [place], [place] [address]  Differential     Status: Abnormal   Collection Time: 11/03/18 11:40 AM  Result Value Ref Range   Neutrophils Relative % 58 %   Neutro Abs 4.7 1.7 - 7.7 K/uL   Lymphocytes Relative 26 %   Lymphs Abs 2.1 0.7 - 4.0 K/uL   Monocytes Relative 15 %   Monocytes Absolute 1.2 (H) 0.1 - 1.0 K/uL   Eosinophils Relative 0 %   Eosinophils Absolute 0.0 0.0 - 0.5 K/uL   Basophils Relative 1 %   Basophils Absolute 0.0 0.0 - 0.1 K/uL   Immature Granulocytes 0 %   Abs Immature Granulocytes 0.03 0.00 - 0.07 K/uL    Comment: Performed at [facility], [address] [place], [place] [address]  Comprehensive metabolic panel     Status: Abnormal   Collection Time: 11/03/18  11:40 AM  Result Value Ref Range   Sodium 134 (L) 135 - 145 mmol/L   Potassium 3.8 3.5 - 5.1 mmol/L   Chloride 100 98 - 111 mmol/L   CO2 24 22 - 32 mmol/L   Glucose, Bld 107 (H) 70 - 99 mg/dL   BUN 5 (L) 6 -  20 mg/dL   Creatinine, Ser 0.77 0.61 - 1.24 mg/dL   Calcium 8.6 (L) 8.9 - 10.3 mg/dL   Total Protein 6.6 6.5 - 8.1 g/dL   Albumin 2.9 (L) 3.5 - 5.0 g/dL   AST 16 15 - 41 U/L   ALT 9 0 - 44 U/L   Alkaline Phosphatase 84 38 - 126 U/L   Total Bilirubin 0.4 0.3 - 1.2 mg/dL   GFR calc non Af Amer >60 >60 mL/min   GFR calc Af Amer >60 >60 mL/min   Anion gap 10 5 - 15    Comment: Performed at [facility], [address] [place], [place] [address]  Troponin I (High Sensitivity)     Status: Abnormal   Collection Time: 11/03/18 11:40 AM  Result Value Ref Range   Troponin I (High Sensitivity) 53 (H) <18 ng/L    Comment: (NOTE) Elevated high sensitivity troponin I (hsTnI) values and significant  changes across serial measurements may suggest ACS but many other  chronic and acute conditions are known to elevate hsTnI results.  Refer to the "Links" section for chest pain algorithms and additional  guidance. Performed at [facility], [address] [place], [place] [address]   Urine rapid drug screen (hosp performed)     Status: Abnormal   Collection Time: 11/03/18  1:04 PM  Result Value Ref Range   Opiates NONE DETECTED NONE DETECTED   Cocaine NONE DETECTED NONE DETECTED   Benzodiazepines NONE DETECTED NONE DETECTED   Amphetamines NONE DETECTED NONE DETECTED   Tetrahydrocannabinol POSITIVE (A) NONE DETECTED   Barbiturates NONE DETECTED NONE DETECTED    Comment: (NOTE) DRUG SCREEN FOR MEDICAL PURPOSES ONLY.  IF CONFIRMATION IS NEEDED FOR ANY PURPOSE, NOTIFY LAB WITHIN 5 DAYS. LOWEST DETECTABLE LIMITS FOR URINE DRUG SCREEN Drug Class                     Cutoff (ng/mL) Amphetamine and metabolites    1000 Barbiturate and metabolites     200 Benzodiazepine                 [id number] Tricyclics and metabolites     300 Opiates and metabolites        300 Cocaine and metabolites        300 THC                            50 Performed at [facility], [address] [place], [place] [address]   Urinalysis, Routine w reflex microscopic     Status: Abnormal   Collection Time: 11/03/18  1:04 PM  Result Value Ref Range   Color, Urine YELLOW YELLOW   APPearance CLEAR CLEAR   Specific Gravity, Urine <1.005 (L) 1.005 - 1.030   pH 6.5 5.0 - 8.0   Glucose, UA NEGATIVE NEGATIVE mg/dL   Hgb urine dipstick TRACE (A) NEGATIVE   Bilirubin Urine NEGATIVE NEGATIVE   Ketones, ur NEGATIVE NEGATIVE mg/dL   Protein, ur NEGATIVE NEGATIVE mg/dL   Nitrite NEGATIVE NEGATIVE   Leukocytes,Ua NEGATIVE NEGATIVE    Comment: Performed at [facility], [address] [place], [place] [address]  Urinalysis, Microscopic (reflex)     Status: Abnormal   Collection Time: 11/03/18  1:04 PM  Result Value Ref Range   RBC / HPF 0-5 0 - 5 RBC/hpf   WBC, UA 0-5 0 -  5 WBC/hpf   Bacteria, UA RARE (A) NONE SEEN   Squamous Epithelial / LPF 0-5 0 - 5   Sperm, UA PRESENT     Comment: Performed at [facility], [address] [place], [place] [address]  Troponin I (High Sensitivity)     Status: Abnormal   Collection Time: 11/03/18  1:25 PM  Result Value Ref Range   Troponin I (High Sensitivity) 46 (H) <18 ng/L    Comment: (NOTE) Elevated high sensitivity troponin I (hsTnI) values and significant  changes across serial measurements may suggest ACS but many other  chronic and acute conditions are known to elevate hsTnI results.  Refer to the "Links" section for chest pain algorithms and additional  guidance. Performed at [facility], [address] [place], [place] [address]    Ct Angio Head W Or Wo Contrast  Result Date: 11/03/2018 CLINICAL DATA:  Acute onset of chest pain today. Numbness of the left arm and hand.  EXAM: CT ANGIOGRAPHY HEAD AND NECK TECHNIQUE: Multidetector CT imaging of the head and neck was performed using the standard protocol during bolus administration of intravenous contrast. Multiplanar CT image reconstructions and MIPs were obtained to evaluate the vascular anatomy. Carotid stenosis measurements (when applicable) are obtained utilizing NASCET criteria, using the distal internal carotid diameter as the denominator. CONTRAST:  174mL OMNIPAQUE IOHEXOL 350 MG/ML SOLN COMPARISON:  Head CT earlier same day FINDINGS: CTA NECK FINDINGS Aortic arch: Arch is negative. Branching pattern is normal without stenosis. Right carotid system: Common carotid artery widely patent to the bifurcation. No carotid bifurcation stenosis or irregularity. Minimal plaque in the ICA bulb. Left carotid system: Common carotid artery widely patent to the bifurcation. Mild atherosclerotic plaque at the carotid bifurcation and proximal external carotid. No internal carotid stenosis or irregularity. Vertebral arteries: Both vertebral artery origins are patent. Mild atherosclerotic plaque on the right but no flow limiting stenosis. On the left, there is 50% stenosis due to focal calcified plaque. Both vertebral arteries are patent through the cervical region to the foramen magnum. Skeleton: Degenerative cervical spondylosis. Prominent osteophytes/disc herniations at C4-5 and C5-6 that could cause symptomatic stenosis of the canal and or foramina. Other neck: Large right parotid mass involving both the deep lobe and superficial lobe extending inferior from the superficial lobe. This has been seen on multiple previous examinations and is not changing, consistent with benign nature. Axial measurements are unchanged since 2017 at 32 x 43 mm. Upper chest: Negative Review of the MIP images confirms the above findings CTA HEAD FINDINGS Anterior circulation: Both internal carotid arteries are patent through the skull base and siphon regions. The  anterior and middle cerebral vessels are patent without proximal stenosis, aneurysm or vascular malformation. No embolic occlusions are identified. Posterior circulation: Both vertebral arteries are widely patent through the foramen magnum to the basilar. The right is dominant. No basilar stenosis. Posterior circulation branch vessels are patent. Patent posterior communicating arteries on each side. Venous sinuses: Patent and normal. Anatomic variants: None significant. Review of the MIP images confirms the above findings IMPRESSION: No acute vascular finding to explain the clinical presentation. No sign of dissection or occlusion of the brachiocephalic or intracranial vessels. Mild atherosclerotic plaque as above but without flow limiting stenosis in the anterior circulation vessels. Posterior circulation vessels are patent. Atherosclerotic disease at both vertebral artery origins without measurable stenosis on the right and with focal 50% stenosis on the left. 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 Advanced cervical degenerative disease with spinal stenosis and foraminal stenosis at C4-5 and C5-6 that could be significant. Electronically Signed   By: Nelson Chimes M.D.   On: 11/03/2018 13:40   Ct Angio Neck W And/or Wo Contrast  Result Date: 11/03/2018 CLINICAL DATA:  Acute onset of chest pain today. Numbness of the left arm and hand. EXAM: CT ANGIOGRAPHY HEAD AND NECK TECHNIQUE: Multidetector CT imaging of the head and neck was performed using the standard protocol during bolus administration of intravenous contrast. Multiplanar CT image reconstructions and MIPs were obtained to evaluate the vascular anatomy. Carotid stenosis measurements (when applicable) are obtained utilizing NASCET criteria, using the distal internal carotid diameter as the denominator. CONTRAST:  197mL OMNIPAQUE IOHEXOL 350 MG/ML SOLN COMPARISON:  Head CT  earlier same day FINDINGS: CTA NECK FINDINGS Aortic arch: Arch is negative. Branching pattern is normal without stenosis. Right carotid system: Common carotid artery widely patent to the bifurcation. No carotid bifurcation stenosis or irregularity. Minimal plaque in the ICA bulb. Left carotid system: Common carotid artery widely patent to the bifurcation. Mild atherosclerotic plaque at the carotid bifurcation and proximal external carotid. No internal carotid stenosis or irregularity. Vertebral arteries: Both vertebral artery origins are patent. Mild atherosclerotic plaque on the right but no flow limiting stenosis. On the left, there is 50% stenosis due to focal calcified plaque. Both vertebral arteries are patent through the cervical region to the foramen magnum. Skeleton: Degenerative cervical spondylosis. Prominent osteophytes/disc herniations at C4-5 and C5-6 that could cause symptomatic stenosis of the canal and or foramina. Other neck: Large right parotid mass involving both the deep lobe and superficial lobe extending inferior from the superficial lobe. This has been seen on multiple previous examinations and is not changing, consistent with benign nature. Axial measurements are unchanged since 2017 at 32 x 43 mm. Upper chest: Negative Review of the MIP images confirms the above findings CTA HEAD FINDINGS Anterior circulation: Both internal carotid arteries are patent through the skull base and siphon regions. The anterior and middle cerebral vessels are patent without proximal stenosis, aneurysm or vascular malformation. No embolic occlusions are identified. Posterior circulation: Both vertebral arteries are widely patent through the foramen magnum to the basilar. The right is dominant. No basilar stenosis. Posterior circulation branch vessels are patent. Patent posterior communicating arteries on each side. Venous sinuses: Patent and normal. Anatomic variants: None significant. Review of the MIP images  confirms the above findings IMPRESSION: No acute vascular finding to explain the clinical presentation. No sign of dissection or occlusion of the brachiocephalic or intracranial vessels. Mild atherosclerotic plaque as above but without flow limiting stenosis in the anterior circulation vessels. Posterior circulation vessels are patent. Atherosclerotic disease at both vertebral artery origins without measurable stenosis on the right and with focal 50% stenosis on the left. 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 Advanced cervical degenerative disease with spinal stenosis and foraminal stenosis at C4-5 and C5-6 that could be significant. Electronically Signed   By: Nelson Chimes M.D.   On: 11/03/2018 13:40   Dg Chest Portable 1 View  Result Date: 11/03/2018 CLINICAL DATA:  Pt states he woke up with chest pain this morning. He took 1 nitroglycerin which relieved the pain. One hour ( between 1030-1045) ago he reports numbness to L lower arm and hand., hx of TIA, HTN, no other complaints EXAM: PORTABLE CHEST  1 VIEW COMPARISON:  Chest radiographs dated 12/28 2019, 06/29/2015 FINDINGS: The heart size and mediastinal contours are within normal limits. The lungs are clear. No pneumothorax or pleural effusion. No acute abnormality in the visualized skeleton. IMPRESSION: Normal chest radiograph. Electronically Signed   By: Audie Pinto M.D.   On: 11/03/2018 11:44   Ct Angio Chest/abd/pel For Dissection W And/or Wo Contrast  Result Date: 11/03/2018 CLINICAL DATA:  58 year old male with acute chest and back pain as well as stroke-like symptoms concerning for dissection. EXAM: CT ANGIOGRAPHY CHEST, ABDOMEN AND PELVIS TECHNIQUE: Multidetector CT imaging through the chest, abdomen and pelvis was performed using the standard protocol during bolus administration of intravenous contrast. Multiplanar reconstructed images and MIPs were  obtained and reviewed to evaluate the vascular anatomy. CONTRAST:  133mL OMNIPAQUE IOHEXOL 350 MG/ML SOLN COMPARISON:  Prior CTA of the chest abdomen and pelvis 03/06/2018 FINDINGS: CTA CHEST FINDINGS Cardiovascular: 2 vessel aortic arch. The right brachiocephalic and left common carotid artery share a common origin. On the unenhanced images, there is no evidence of intramural high attenuation to suggest intramural hematoma. Following administration of intravenous contrast, there is very subtle irregularity of the intima in the ascending thoracic aorta just proximal to the aortic arch. There is a linear irregular filling defect extending into the aortic lumen measuring approximately 1.4 cm in length by 0.2 mm in width. This almost certainly represents an area of linear thrombus adherent to the aortic wall. No evidence of aortic wall thickening, stranding in the mediastinal fat or hemorrhage. The remainder of the aortic arch and descending thoracic aorta are normal. The aortic root is normal. The heart is normal in size. Calcifications are present along the left main, left anterior descending, circumflex and right coronary artery. No pericardial effusion. Mediastinum/Nodes: Unremarkable CT appearance of the thyroid gland. No suspicious mediastinal or hilar adenopathy. No soft tissue mediastinal mass. The thoracic esophagus is unremarkable. Lungs/Pleura: Lungs are clear. No pleural effusion or pneumothorax. Musculoskeletal: No acute fracture or aggressive appearing lytic or blastic osseous lesion. Small low-attenuation lesion just deep to the skin surface on the right anterior chest wall just above the nipple line likely represents a sebaceous or other benign cutaneous cyst. Review of the MIP images confirms the above findings. CTA ABDOMEN AND PELVIS FINDINGS VASCULAR Aorta: The aorta is normal in caliber without evidence of aneurysm. Compared to the prior study from December of 2019, there has been significant  interval progression of fibrofatty atherosclerotic plaque which is become irregular and ulcerated beginning at the aortic hiatus and extending through the visceral, renal and infrarenal segments of the aorta. Just below the renal arteries, the lumen is narrowed by approximately 70%. Celiac: Patent without evidence of aneurysm, dissection, vasculitis or significant stenosis. SMA: Patent without evidence of aneurysm, dissection, vasculitis or significant stenosis. Renals: Both renal arteries are patent without evidence of aneurysm, dissection, vasculitis, fibromuscular dysplasia or significant stenosis. IMA: Patent without evidence of aneurysm, dissection, vasculitis or significant stenosis. Inflow: Heterogeneous calcified and noncalcified atherosclerotic plaque without significant stenosis in either common iliac artery. No evidence of dissection. The left internal iliac artery is occluded at the origin but ultimately reconstitutes. The external iliac arteries are patent. 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 Veins: No focal venous abnormality. Review of the MIP images confirms the above findings. NON-VASCULAR Hepatobiliary: Normal morphology. Stable circumscribed low-attenuation lesions which almost certainly represent benign cysts. Gallbladder is unremarkable. No intra or extrahepatic biliary ductal dilatation.  Pancreas: Unremarkable. No pancreatic ductal dilatation or surrounding inflammatory changes. Spleen: Normal in size without focal abnormality. Adrenals/Urinary Tract: Normal adrenal glands. No evidence of hydronephrosis, nephrolithiasis or enhancing renal mass. Stomach/Bowel: Stomach is within normal limits. Appendix appears normal. No evidence of bowel wall thickening, distention, or inflammatory changes. Lymphatic: No suspicious lymphadenopathy. Reproductive: Asymmetric enhancement of the right posterolateral prostate gland with patchy enhancement  elsewhere. Findings are significantly more conspicuous compared to prior imaging. Other: No abdominal wall hernia or abnormality. No abdominopelvic ascites. Musculoskeletal: Multifocal sclerotic foci present throughout the skeleton. An index lesion in the left hemi sacrum measures slightly larger compared to the prior study at 0.9 cm compared to 0.8 cm previously. A second index lesion in the right transverse process of T1 measures 1.0 cm compared to 0.8 cm previously. Numerous additional lesions are present scattered throughout the thoracic and lumbar spine. These lesions are significantly smaller and not dramatically changed. Lesions are also identified in both iliac bones, bilateral ischial tuberosities, ribs and the right femoral head. The smaller of the 2 femoral head lesions also appears slightly enlarged. Review of the MIP images confirms the above findings. IMPRESSION: CTA CHEST 1. 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 This is just proximal to the origin of the right brachiocephalic artery which supplies both carotid arteries. This lesion is at high risk for embolization which could cause additional large vessel occlusion in the cerebral vasculature. The intimal irregularity could represent the form fruste of an aortic dissection if the patient has significant hypertension although the abnormality is confined to the intima. There is no evidence of thickening or hemorrhage within the aortic media. 2. Left main and 3 vessel coronary artery disease. CTA ABD/PELVIS 1. No evidence of acute aortic dissection or aneurysm. 2. However, there has been significant interval progression of irregular/ulcerated hypoechoic plaque and likely wall adherent mural thrombus within the visceral, renal and infrarenal abdominal aorta. Suspect embolic phenomena due to flush occlusions of the left  internal iliac artery and a large branch of the left profunda femoral artery. 3. 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 There is irregular patchy enhancement throughout the prostate gland more (more on the right than the left) with numerous multifocal sclerotic bone lesions in the pelvis, right femoral head, lumbar and thoracic spine and ribs. At least a few of the small sclerotic foci have subtly enlarged compared to prior imaging. Recommend correlation with serum PSA and referral to urology. These results were called by telephone at the time of interpretation on 11/03/2018 at 1:11 pm to Dr. Sherwood Gambler , who verbally acknowledged these results. Signed, Criselda Peaches, MD, [facility] [facility] Radiology Electronically Signed   By: Jacqulynn Cadet M.D.   On: 11/03/2018 13:59   Ct Head Code Stroke Wo Contrast  Result Date: 11/03/2018 CLINICAL DATA:  Code stroke.  Left arm numbness EXAM: CT HEAD WITHOUT CONTRAST TECHNIQUE: Contiguous axial images were obtained from the base of the skull through the vertex without intravenous contrast. COMPARISON:  06/30/2015 brain MRI FINDINGS: Brain: No evidence of acute infarction, hemorrhage, hydrocephalus, extra-axial collection or mass lesion/mass effect. Vascular: No hyperdense vessel or unexpected calcification. Skull: Normal. Negative for fracture or focal lesion. Sinuses/Orbits: No acute finding. Other: These results were called by telephone at the time of interpretation on 11/03/2018 at 11:39  am to Dr. Sherwood Gambler , who verbally acknowledged these results. ASPECTS [facility] Stroke Program Early CT Score) - Ganglionic level infarction (caudate, lentiform nuclei, internal capsule, insula, M1-M3 cortex): 7 - Supraganglionic infarction (M4-M6 cortex): 3 Total score (0-10 with 10 being normal): 10 IMPRESSION: Negative head CT.ASPECTS is 10. Electronically Signed   By:  Monte Fantasia M.D.   On: 11/03/2018 11:41    Pending Labs Unresulted Labs (From admission, onward)    Start     Ordered   11/03/18 1423  PSA  ONCE - STAT,   STAT     11/03/18 1422   11/03/18 1157  SARS CORONAVIRUS 2 (TAT 6-12 HRS) Nasal Swab Aptima Multi Swab  (Asymptomatic/Tier 2 Patients Labs)  Once,   STAT    Question Answer Comment  Is this test for diagnosis or screening Screening   Symptomatic for COVID-19 as defined by CDC No   Hospitalized for COVID-19 No   Admitted to ICU for COVID-19 No   Previously tested for COVID-19 No   Resident in a congregate (group) care setting No   Employed in healthcare setting No      11/03/18 1156          Vitals/Pain Today's Vitals   11/03/18 1300 11/03/18 1342 11/03/18 1345 11/03/18 1400  BP: (!) 148/95 (!) 158/95  136/86  Pulse: 77 77  79  Resp: 13 16  17   Temp:      TempSrc:      SpO2: 100% 98%  100%  Weight:      PainSc:   0-No pain     Isolation Precautions No active isolations  Medications Medications  atorvastatin (LIPITOR) tablet 80 mg (80 mg Oral Not Given 11/03/18 1158)  alteplase (ACTIVASE) 1 mg/mL injection (  Not Given 11/03/18 1330)  aspirin chewable tablet 324 mg (324 mg Oral Given 11/03/18 1158)  iohexol (OMNIPAQUE) 350 MG/ML injection 100 mL (100 mLs Intravenous Contrast Given 11/03/18 1242)  iohexol (OMNIPAQUE) 350 MG/ML injection 100 mL (100 mLs Intravenous Contrast Given 11/03/18 1241)    Mobility walks Low fall risk   Focused Assessments Neuro Assessment Handoff:  Swallow screen pass? Yes  Cardiac Rhythm: Normal sinus rhythm NIH Stroke Scale ( + Modified Stroke Scale Criteria)  Interval: Other (Comment)(reassessment by different teleneurologist) Level of Consciousness (1a.)   : Alert, keenly responsive LOC Questions (1b. )   +: Answers both questions correctly LOC Commands (1c. )   + : Performs both tasks correctly Best Gaze (2. )  +: Normal Visual (3. )  +: No visual loss Facial Palsy (4. )     : Normal symmetrical movements Motor Arm, Left (5a. )   +: No drift Motor Arm, Right (5b. )   +: No drift Motor Leg, Left (6a. )   +: No drift Motor Leg, Right (6b. )   +: No drift Limb Ataxia (7. ): Absent Sensory (8. )   +: Mild-to-moderate sensory loss, patient feels pinprick is less sharp or is dull on the affected side, or there is a loss of superficial pain with pinprick, but patient is aware of being touched Best Language (9. )   +: No aphasia Dysarthria (10. ): Normal Extinction/Inattention (11.)   +: No Abnormality Modified SS Total  +: 1 Complete NIHSS TOTAL: 1     Neuro Assessment:   Neuro Checks:   Initial (11/03/18 1131)  Last Documented NIHSS Modified Score: 1 (11/03/18 1411) Has TPA been given? no If patient is a  Neuro Trauma and patient is going to OR before floor call report to [place] nurse: [phone] or [phone]     R Recommendations: See Admitting Provider Note  Report given to:   Additional Notes: none

## 2018-11-03 NOTE — ED Provider Notes (Signed)
[facility] Provider Note   CSN: [id number] Arrival date & time: 11/03/18  1112     History   Chief Complaint Chief Complaint  Patient presents with   Chest Pain   Numbness    HPI Daniel Weiss is a 58 y.o. male.     HPI  58 year old male presents with left arm weakness and numbness.  He states that this morning around 3:30 AM he awoke with chest pain.  Went away after 1 nitroglycerin.  No further chest pain and then he went to work.  At around 10:45 AM he noticed his left arm suddenly went numb and was heavy.  He cannot use it for his normal job.  That feeling has improved but his left arm still feels a little weak and is still numb compared to the right.  No face symptoms, trouble speaking, or leg symptoms.  The chest pain has not recurred.  On the drive over here he had a brief headache that was minimal and is now gone.  No neck pain.  Past Medical History:  Diagnosis Date   Abnormal chest CT    a. Ectatic 3 cm Ao arch - rec f/u outpt imaging w/ CTA or MRA. Ectatic atheromatous abd Ao @ risk for aneurysm - rec f/u u/s in 5 yrs. Marked prostatic enlargement w/ scattered small sclerotic foci in the thoracic lower lumbar spine, sacrum, left 12th rib, pelvis, and right femur.  Recommend elective outpatient whole-body bone scintigraphy and PSA.   CAD (coronary artery disease)    a. 02/2018 ACS/PCI: LM nl, LAD 85p (3.5x15 Sierra DES), D1 45ost, RI 55ost, RCA nl, EF 25-35%.   Complete left bundle branch block (LBBB) 2016   Current every day smoker    HFrEF (heart failure with reduced ejection fraction) ([place])    Hypertension    Ischemic cardiomyopathy 02/2018   a. 02/2018 Echo: EF 35-40% (LV gram on cath was 25-30%), mod, diff HK. mid-apicalanteroseptal, ant, and apical sev HK. RVH.   TIA (transient ischemic attack) 2016   Warthin's tumor     Patient Active Problem List   Diagnosis Date Noted   Presence of drug coated stent in LAD  coronary artery 03/26/2018   Complete left bundle branch block (LBBB) 03/26/2018   Coronary artery disease involving native coronary artery of native heart with angina pectoris ([place]) 03/10/2018   Hyperlipidemia LDL goal <70 03/10/2018   Enlarged prostate 03/10/2018   Abnormal CT scan 03/10/2018   Ischemic cardiomyopathy 03/10/2018   Hypokalemia    Tobacco abuse    Non-ST elevation (NSTEMI) myocardial infarction ([place]) 03/06/2018   Postmyocardial infarction angina as complication of acute myocardial infarction ([place]) 03/06/2018   Sinus tachycardia 03/06/2018   Encephalopathy, hypertensive    ARF (acute renal failure) ([place]) 06/29/2015   Hyponatremia 06/29/2015   Dehydration 06/29/2015   Prolonged QT interval 06/29/2015   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   H/O medication noncompliance    Other headache syndrome    Localized swelling, mass or lump of neck    Neck mass    History of TIA (transient ischemic attack)    Paresthesia 07/07/2014   Essential hypertension 07/07/2014   Right sided weakness 07/07/2014   Mass of right side of neck 07/07/2014    Past Surgical History:  Procedure Laterality Date   CORONARY STENT INTERVENTION N/A 03/06/2018   Procedure: CORONARY STENT INTERVENTION;  Surgeon: [name], MD;  Location: [facility];  Service: Cardiovascular: Proximal LAD 85% stenosis (  and D1): DES PCI with Xience Sierra DES 3.5 mm x 15 mm - 3.9 mm   INTRAOPERATIVE TRANSTHORACIC ECHOCARDIOGRAM  03/06/2018   (Peri-MI) mild reduced EF 35-40%.  Diffuse hypokinesis (severe HK of the mid-apical anteroseptal, anterior and apical myocardium consistent with LAD infarct).  Unable to assess diastolic function.  Paradoxical septal motion likely related to his LBBB.  RV hypertrophy noted.  Trivial pericardial effusion noted.    LEFT HEART CATH AND CORONARY ANGIOGRAPHY N/A 03/06/2018   Procedure: LEFT HEART CATH AND CORONARY ANGIOGRAPHY;  Surgeon: [name],  MD;  Location: [facility];  Service: Cardiovascular: Proximal LAD 85% involving D1 45%.  Ostial RI 55%.  Severe LV dysfunction EF 25 to 30%.  1+ MR.  Only minimally elevated LVEDP.        Home Medications    Prior to Admission medications   Medication Sig Start Date End Date Taking? Authorizing Provider  acetaminophen (TYLENOL) 325 MG tablet Take 2 tablets (650 mg total) by mouth every 4 (four) hours as needed for mild pain (temperature >/= 99.5 F). 07/08/14   Ahmed, Chesley Mires, MD  aspirin EC 81 MG EC tablet Take 1 tablet (81 mg total) by mouth daily. 07/08/14   Dellia Nims, MD  atorvastatin (LIPITOR) 80 MG tablet Take 1 tablet (80 mg total) by mouth daily. 03/10/18 10/06/18  Theora Gianotti, NP  carvedilol (COREG) 12.5 MG tablet Take 1 tablet (12.5 mg total) by mouth 2 (two) times daily with a meal. 06/25/18   [name], MD  lisinopril (PRINIVIL,ZESTRIL) 10 MG tablet Take 1 tablet (10 mg total) by mouth daily. 03/26/18 06/25/18  [name], MD  nitroGLYCERIN (NITROSTAT) 0.4 MG SL tablet Place 1 tablet (0.4 mg total) under the tongue every 5 (five) minutes as needed for chest pain. 03/10/18   Theora Gianotti, NP  ticagrelor (BRILINTA) 90 MG TABS tablet Take 1 tablet (90 mg total) by mouth 2 (two) times daily. 03/10/18   Theora Gianotti, NP    Family History Family History  Problem Relation Age of Onset   Stroke Mother    Hypertension Mother    Diabetes type II Mother    Leukemia Other    Breast cancer Other    CAD Neg Hx     Social History Social History   Tobacco Use   Smoking status: Current Every Day Smoker    Packs/day: 0.50    Types: Cigarettes   Smokeless tobacco: Former Systems developer    Quit date: 02/23/2018  Substance Use Topics   Alcohol use: Yes    Alcohol/week: 2.0 standard drinks    Types: 2 Cans of beer per week    Comment: 2 Beers daily.   Drug use: Yes    Frequency: 3.0 times per week    Types: Marijuana      Allergies   Patient has no known allergies.   Review of Systems Review of Systems  Respiratory: Negative for shortness of breath.   Cardiovascular: Positive for chest pain.  Musculoskeletal: Negative for neck pain.  Neurological: Positive for weakness, numbness and headaches (transient, gone now).  All other systems reviewed and are negative.    Physical Exam Updated Vital Signs BP (!) 141/93    Pulse 71    Temp 98.1 F (36.7 C) (Oral)    Resp 20    Wt 59.4 kg    SpO2 98%    BMI 17.28 kg/m   Physical Exam Vitals signs and nursing note  reviewed.  Constitutional:      Appearance: He is well-developed.  HENT:     Head: Normocephalic and atraumatic.     Right Ear: External ear normal.     Left Ear: External ear normal.     Nose: Nose normal.  Eyes:     General:        Right eye: No discharge.        Left eye: No discharge.  Neck:     Musculoskeletal: Neck supple.  Cardiovascular:     Rate and Rhythm: Normal rate and regular rhythm.     Pulses:          Radial pulses are 2+ on the right side and 2+ on the left side.     Heart sounds: Normal heart sounds.  Pulmonary:     Effort: Pulmonary effort is normal.     Breath sounds: Normal breath sounds.  Abdominal:     Palpations: Abdomen is soft.     Tenderness: There is no abdominal tenderness.  Skin:    General: Skin is warm and dry.  Neurological:     Mental Status: He is alert.     Comments: CN 3-12 grossly intact with perhaps a mild left facial droop. 5/5 strength in all 4 extremities. no pronator drift. Subjective decreased sensation in the Left upper extremity. He can still differentiate sharp/dull in the left hand/arm. Normal finger to nose.   Psychiatric:        Mood and Affect: Mood is not anxious.      ED Treatments / Results  Labs (all labs ordered are listed, but only abnormal results are displayed) Labs Reviewed  CBC - Abnormal; Notable for the following components:      Result Value   RBC 4.02  (*)    Hemoglobin 10.6 (*)    HCT 32.6 (*)    RDW 16.8 (*)    Platelets 467 (*)    All other components within normal limits  DIFFERENTIAL - Abnormal; Notable for the following components:   Monocytes Absolute 1.2 (*)    All other components within normal limits  COMPREHENSIVE METABOLIC PANEL - Abnormal; Notable for the following components:   Sodium 134 (*)    Glucose, Bld 107 (*)    BUN 5 (*)    Calcium 8.6 (*)    Albumin 2.9 (*)    All other components within normal limits  RAPID URINE DRUG SCREEN, HOSP PERFORMED - Abnormal; Notable for the following components:   Tetrahydrocannabinol POSITIVE (*)    All other components within normal limits  URINALYSIS, ROUTINE W REFLEX MICROSCOPIC - Abnormal; Notable for the following components:   Specific Gravity, Urine <1.005 (*)    Hgb urine dipstick TRACE (*)    All other components within normal limits  URINALYSIS, MICROSCOPIC (REFLEX) - Abnormal; Notable for the following components:   Bacteria, UA RARE (*)    All other components within normal limits  CBG MONITORING, ED - Abnormal; Notable for the following components:   Glucose-Capillary 114 (*)    All other components within normal limits  TROPONIN I (HIGH SENSITIVITY) - Abnormal; Notable for the following components:   Troponin I (High Sensitivity) 53 (*)    All other components within normal limits  TROPONIN I (HIGH SENSITIVITY) - Abnormal; Notable for the following components:   Troponin I (High Sensitivity) 46 (*)    All other components within normal limits  SARS CORONAVIRUS 2 (TAT 6-12 HRS)  ETHANOL  PROTIME-INR  APTT  PSA    EKG EKG Interpretation  Date/Time:  Wednesday November 03 2018 11:20:43 EDT Ventricular Rate:  80 PR Interval:    QRS Duration: 139 QT Interval:  353 QTC Calculation: 408 R Axis:   73 Text Interpretation:  Sinus rhythm LAE, consider biatrial enlargement IVCD, consider atypical LBBB no significant change since July 2020 Confirmed by Sherwood Gambler [phone]) on 11/03/2018 11:39:08 AM   Radiology Ct Angio Head W Or Wo Contrast  Result Date: 11/03/2018 CLINICAL DATA:  Acute onset of chest pain today. Numbness of the left arm and hand. EXAM: CT ANGIOGRAPHY HEAD AND NECK TECHNIQUE: Multidetector CT imaging of the head and neck was performed using the standard protocol during bolus administration of intravenous contrast. Multiplanar CT image reconstructions and MIPs were obtained to evaluate the vascular anatomy. Carotid stenosis measurements (when applicable) are obtained utilizing NASCET criteria, using the distal internal carotid diameter as the denominator. CONTRAST:  120mL OMNIPAQUE IOHEXOL 350 MG/ML SOLN COMPARISON:  Head CT earlier same day FINDINGS: CTA NECK FINDINGS Aortic arch: Arch is negative. Branching pattern is normal without stenosis. Right carotid system: Common carotid artery widely patent to the bifurcation. No carotid bifurcation stenosis or irregularity. Minimal plaque in the ICA bulb. Left carotid system: Common carotid artery widely patent to the bifurcation. Mild atherosclerotic plaque at the carotid bifurcation and proximal external carotid. No internal carotid stenosis or irregularity. Vertebral arteries: Both vertebral artery origins are patent. Mild atherosclerotic plaque on the right but no flow limiting stenosis. On the left, there is 50% stenosis due to focal calcified plaque. Both vertebral arteries are patent through the cervical region to the foramen magnum. Skeleton: Degenerative cervical spondylosis. Prominent osteophytes/disc herniations at C4-5 and C5-6 that could cause symptomatic stenosis of the canal and or foramina. Other neck: Large right parotid mass involving both the deep lobe and superficial lobe extending inferior from the superficial lobe. This has been seen on multiple previous examinations and is not changing, consistent with benign nature. Axial measurements are unchanged since 2017 at 32 x 43 mm. Upper  chest: Negative Review of the MIP images confirms the above findings CTA HEAD FINDINGS Anterior circulation: Both internal carotid arteries are patent through the skull base and siphon regions. The anterior and middle cerebral vessels are patent without proximal stenosis, aneurysm or vascular malformation. No embolic occlusions are identified. Posterior circulation: Both vertebral arteries are widely patent through the foramen magnum to the basilar. The right is dominant. No basilar stenosis. Posterior circulation branch vessels are patent. Patent posterior communicating arteries on each side. Venous sinuses: Patent and normal. Anatomic variants: None significant. Review of the MIP images confirms the above findings IMPRESSION: No acute vascular finding to explain the clinical presentation. No sign of dissection or occlusion of the brachiocephalic or intracranial vessels. Mild atherosclerotic plaque as above but without flow limiting stenosis in the anterior circulation vessels. Posterior circulation vessels are patent. Atherosclerotic disease at both vertebral artery origins without measurable stenosis on the right and with focal 50% stenosis on the left. 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 Advanced cervical degenerative disease with spinal stenosis and foraminal stenosis at C4-5 and C5-6 that could be significant. Electronically Signed   By: Nelson Chimes M.D.   On: 11/03/2018 13:40   Ct Angio Neck W And/or Wo Contrast  Result Date: 11/03/2018 CLINICAL DATA:  Acute onset of chest pain today. Numbness of the left arm and hand.  EXAM: CT ANGIOGRAPHY HEAD AND NECK TECHNIQUE: Multidetector CT imaging of the head and neck was performed using the standard protocol during bolus administration of intravenous contrast. Multiplanar CT image reconstructions and MIPs were obtained to evaluate the vascular anatomy. Carotid stenosis  measurements (when applicable) are obtained utilizing NASCET criteria, using the distal internal carotid diameter as the denominator. CONTRAST:  149mL OMNIPAQUE IOHEXOL 350 MG/ML SOLN COMPARISON:  Head CT earlier same day FINDINGS: CTA NECK FINDINGS Aortic arch: Arch is negative. Branching pattern is normal without stenosis. Right carotid system: Common carotid artery widely patent to the bifurcation. No carotid bifurcation stenosis or irregularity. Minimal plaque in the ICA bulb. Left carotid system: Common carotid artery widely patent to the bifurcation. Mild atherosclerotic plaque at the carotid bifurcation and proximal external carotid. No internal carotid stenosis or irregularity. Vertebral arteries: Both vertebral artery origins are patent. Mild atherosclerotic plaque on the right but no flow limiting stenosis. On the left, there is 50% stenosis due to focal calcified plaque. Both vertebral arteries are patent through the cervical region to the foramen magnum. Skeleton: Degenerative cervical spondylosis. Prominent osteophytes/disc herniations at C4-5 and C5-6 that could cause symptomatic stenosis of the canal and or foramina. Other neck: Large right parotid mass involving both the deep lobe and superficial lobe extending inferior from the superficial lobe. This has been seen on multiple previous examinations and is not changing, consistent with benign nature. Axial measurements are unchanged since 2017 at 32 x 43 mm. Upper chest: Negative Review of the MIP images confirms the above findings CTA HEAD FINDINGS Anterior circulation: Both internal carotid arteries are patent through the skull base and siphon regions. The anterior and middle cerebral vessels are patent without proximal stenosis, aneurysm or vascular malformation. No embolic occlusions are identified. Posterior circulation: Both vertebral arteries are widely patent through the foramen magnum to the basilar. The right is dominant. No basilar  stenosis. Posterior circulation branch vessels are patent. Patent posterior communicating arteries on each side. Venous sinuses: Patent and normal. Anatomic variants: None significant. Review of the MIP images confirms the above findings IMPRESSION: No acute vascular finding to explain the clinical presentation. No sign of dissection or occlusion of the brachiocephalic or intracranial vessels. Mild atherosclerotic plaque as above but without flow limiting stenosis in the anterior circulation vessels. Posterior circulation vessels are patent. Atherosclerotic disease at both vertebral artery origins without measurable stenosis on the right and with focal 50% stenosis on the left. 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 Advanced cervical degenerative disease with spinal stenosis and foraminal stenosis at C4-5 and C5-6 that could be significant. Electronically Signed   By: Nelson Chimes M.D.   On: 11/03/2018 13:40   Dg Chest Portable 1 View  Result Date: 11/03/2018 CLINICAL DATA:  Pt states he woke up with chest pain this morning. He took 1 nitroglycerin which relieved the pain. One hour ( between 1030-1045) ago he reports numbness to L lower arm and hand., hx of TIA, HTN, no other complaints EXAM: PORTABLE CHEST 1 VIEW COMPARISON:  Chest radiographs dated 12/28 2019, 06/29/2015 FINDINGS: The heart size and mediastinal contours are within normal limits. The lungs are clear. No pneumothorax or pleural effusion. No acute abnormality in the visualized skeleton. IMPRESSION: Normal chest radiograph. Electronically Signed   By: Audie Pinto M.D.   On: 11/03/2018 11:44   Ct Angio Chest/abd/pel For Dissection W And/or Wo Contrast  Result Date: 11/03/2018 CLINICAL DATA:  58 year old male with acute chest and back pain as well as stroke-like symptoms concerning for dissection. EXAM: CT ANGIOGRAPHY CHEST, ABDOMEN AND PELVIS TECHNIQUE:  Multidetector CT imaging through the chest, abdomen and pelvis was performed using the standard protocol during bolus administration of intravenous contrast. Multiplanar reconstructed images and MIPs were obtained and reviewed to evaluate the vascular anatomy. CONTRAST:  139mL OMNIPAQUE IOHEXOL 350 MG/ML SOLN COMPARISON:  Prior CTA of the chest abdomen and pelvis 03/06/2018 FINDINGS: CTA CHEST FINDINGS Cardiovascular: 2 vessel aortic arch. The right brachiocephalic and left common carotid artery share a common origin. On the unenhanced images, there is no evidence of intramural high attenuation to suggest intramural hematoma. Following administration of intravenous contrast, there is very subtle irregularity of the intima in the ascending thoracic aorta just proximal to the aortic arch. There is a linear irregular filling defect extending into the aortic lumen measuring approximately 1.4 cm in length by 0.2 mm in width. This almost certainly represents an area of linear thrombus adherent to the aortic wall. No evidence of aortic wall thickening, stranding in the mediastinal fat or hemorrhage. The remainder of the aortic arch and descending thoracic aorta are normal. The aortic root is normal. The heart is normal in size. Calcifications are present along the left main, left anterior descending, circumflex and right coronary artery. No pericardial effusion. Mediastinum/Nodes: Unremarkable CT appearance of the thyroid gland. No suspicious mediastinal or hilar adenopathy. No soft tissue mediastinal mass. The thoracic esophagus is unremarkable. Lungs/Pleura: Lungs are clear. No pleural effusion or pneumothorax. Musculoskeletal: No acute fracture or aggressive appearing lytic or blastic osseous lesion. Small low-attenuation lesion just deep to the skin surface on the right anterior chest wall just above the nipple line likely represents a sebaceous or other benign cutaneous cyst. Review of the MIP images confirms the  above findings. CTA ABDOMEN AND PELVIS FINDINGS VASCULAR Aorta: The aorta is normal in caliber without evidence of aneurysm. Compared to the prior study from December of 2019, there has been significant interval progression of fibrofatty atherosclerotic plaque which is become irregular and ulcerated beginning at the aortic hiatus and extending through the visceral, renal and infrarenal segments of the aorta. Just below the renal arteries, the lumen is narrowed by approximately 70%. Celiac: Patent without evidence of aneurysm, dissection, vasculitis or significant stenosis. SMA: Patent without evidence of aneurysm, dissection, vasculitis or significant stenosis. Renals: Both renal arteries are patent without evidence of aneurysm, dissection, vasculitis, fibromuscular dysplasia or significant stenosis. IMA: Patent without evidence of aneurysm, dissection, vasculitis or significant stenosis. Inflow: Heterogeneous calcified and noncalcified atherosclerotic plaque without significant stenosis in either common iliac artery. No evidence of dissection. The left internal iliac artery is occluded at the origin but ultimately reconstitutes. The external iliac arteries are patent. 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 Veins: No focal venous abnormality. Review of the MIP images confirms the above findings. NON-VASCULAR Hepatobiliary: Normal morphology. Stable circumscribed low-attenuation lesions which almost certainly represent benign cysts. Gallbladder is unremarkable. No intra or extrahepatic biliary ductal dilatation. Pancreas: Unremarkable. No pancreatic ductal dilatation or surrounding inflammatory changes. Spleen: Normal in size without focal abnormality. Adrenals/Urinary Tract: Normal adrenal glands. No evidence of hydronephrosis, nephrolithiasis or enhancing renal mass. Stomach/Bowel: Stomach is within normal limits. Appendix appears normal. No evidence of bowel wall  thickening, distention, or inflammatory changes. Lymphatic: No suspicious lymphadenopathy. Reproductive: Asymmetric enhancement of the right posterolateral prostate gland with patchy enhancement elsewhere. Findings are significantly more conspicuous  compared to prior imaging. Other: No abdominal wall hernia or abnormality. No abdominopelvic ascites. Musculoskeletal: Multifocal sclerotic foci present throughout the skeleton. An index lesion in the left hemi sacrum measures slightly larger compared to the prior study at 0.9 cm compared to 0.8 cm previously. A second index lesion in the right transverse process of T1 measures 1.0 cm compared to 0.8 cm previously. Numerous additional lesions are present scattered throughout the thoracic and lumbar spine. These lesions are significantly smaller and not dramatically changed. Lesions are also identified in both iliac bones, bilateral ischial tuberosities, ribs and the right femoral head. The smaller of the 2 femoral head lesions also appears slightly enlarged. Review of the MIP images confirms the above findings. IMPRESSION: CTA CHEST 1. 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 This is just proximal to the origin of the right brachiocephalic artery which supplies both carotid arteries. This lesion is at high risk for embolization which could cause additional large vessel occlusion in the cerebral vasculature. The intimal irregularity could represent the form fruste of an aortic dissection if the patient has significant hypertension although the abnormality is confined to the intima. There is no evidence of thickening or hemorrhage within the aortic media. 2. Left main and 3 vessel coronary artery disease. CTA ABD/PELVIS 1. No evidence of acute aortic dissection or aneurysm. 2. However, there has been significant interval progression of  irregular/ulcerated hypoechoic plaque and likely wall adherent mural thrombus within the visceral, renal and infrarenal abdominal aorta. 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 3. 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 There is irregular patchy enhancement throughout the prostate gland more (more on the right than the left) with numerous multifocal sclerotic bone lesions in the pelvis, right femoral head, lumbar and thoracic spine and ribs. At least a few of the small sclerotic foci have subtly enlarged compared to prior imaging. Recommend correlation with serum PSA and referral to urology. These results were called by telephone at the time of interpretation on 11/03/2018 at 1:11 pm to Dr. Sherwood Gambler , who verbally acknowledged these results. Signed, Criselda Peaches, MD, [facility] Radiology Electronically Signed   By: Jacqulynn Cadet M.D.   On: 11/03/2018 13:59   Ct Head Code Stroke Wo Contrast  Result Date: 11/03/2018 CLINICAL DATA:  Code stroke.  Left arm numbness EXAM: CT HEAD WITHOUT CONTRAST TECHNIQUE: Contiguous axial images were obtained from the base of the skull through the vertex without intravenous contrast. COMPARISON:  06/30/2015 brain MRI FINDINGS: Brain: No evidence of acute infarction, hemorrhage, hydrocephalus, extra-axial collection or mass lesion/mass effect. Vascular: No hyperdense vessel or unexpected calcification. Skull: Normal. Negative for fracture or focal lesion. Sinuses/Orbits: No acute finding. Other: These results were called by telephone at the time of interpretation on 11/03/2018 at 11:39 am to Dr. Sherwood Gambler , who verbally acknowledged these results. ASPECTS [facility] Stroke Program Early CT Score) - Ganglionic level infarction (caudate, lentiform nuclei, internal  capsule, insula, M1-M3 cortex): 7 - Supraganglionic infarction (M4-M6 cortex): 3 Total score (0-10 with 10 being normal): 10 IMPRESSION: Negative head CT.ASPECTS is 10. Electronically Signed   By: Monte Fantasia M.D.   On: 11/03/2018 11:41    Procedures .Critical Care Performed  by: Sherwood Gambler, MD Authorized by: Sherwood Gambler, MD   Critical care provider statement:    Critical care time (minutes):  60   Critical care time was exclusive of:  Separately billable procedures and treating other patients   Critical care was necessary to treat or prevent imminent or life-threatening deterioration of the following conditions:  CNS failure or compromise   Critical care was time spent personally by me on the following activities:  Discussions with consultants, evaluation of patient's response to treatment, examination of patient, ordering and performing treatments and interventions, ordering and review of laboratory studies, ordering and review of radiographic studies, pulse oximetry, re-evaluation of patient's condition, obtaining history from patient or surrogate and review of old charts   (including critical care time)  Medications Ordered in ED Medications  atorvastatin (LIPITOR) tablet 80 mg (80 mg Oral Not Given 11/03/18 1158)  alteplase (ACTIVASE) 1 mg/mL injection (  Not Given 11/03/18 1330)  aspirin chewable tablet 324 mg (324 mg Oral Given 11/03/18 1158)  iohexol (OMNIPAQUE) 350 MG/ML injection 100 mL (100 mLs Intravenous Contrast Given 11/03/18 1242)  iohexol (OMNIPAQUE) 350 MG/ML injection 100 mL (100 mLs Intravenous Contrast Given 11/03/18 1241)     Initial Impression / Assessment and Plan / ED Course  I have reviewed the triage vital signs and the nursing notes.  Pertinent labs & imaging results that were available during my care of the patient were reviewed by me and considered in my medical decision making (see chart for details).  Clinical Course as of Nov 02 1499  Wed Nov 03, 2018  1128 Patient's exam at this time reveals no weakness though he feels a little weaker on the left upper extremity.  Given this feeling and he is in the code stroke window, a code stroke will be called.   [SG]  1150 Patient's NIH is low, and given significant improvement it is not a slam dunk to do TPA.  Neurology, Dr. Pricilla Holm, has talked with patient and offered TPA but he is hesitant given risks.  Thus decided to do every 15 neuro checks until window closes and if getting worse or something changes then reevaluate.  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   [SG]  1300 Patient tells me the hand numbness is still quite bothersome but has not gotten worse.  However, he is reconsidering whether or not to do TPA.  Thus will get tele-neurology back on the line.   [SG]  1301 Dr Tommi Rumps of tele-neuro has evaluated patient. Facial weakness better, now only left hand numbness. He advises no TPA, treat as stroke and per consult recommendations based on CT findings.   [SG]    Clinical Course User Index [SG] Sherwood Gambler, MD       Patient is very complicated.  From a stroke standpoint his NIH is low and at this point no TPA.  His CT angiography is very complex from a chest/abdomen/pelvis perspective.  Questionable early dissection versus thrombus in his aorta.  Radiology is recommending heparin and there is possible evidence of clotting in other blood vessels.  However Dr. Roxy Manns of cardiothoracic surgery feels this is unclear and needs a cardiac gated CTA of the heart. No heparin for now per CT.  I discussed with Dr. Tamala Julian, hospitalist, for admission.  Dr. Roxy Manns called back and states he actually needs to come to the ED more  emergently to rule this out as this is a possible surgical emergency.  He will go via [facility] and I made Dr. Wilson Singer, [place] aware.  Final Clinical Impressions(s) / ED Diagnoses    Final diagnoses:  Acute cardioembolic stroke [facility])    ED Discharge Orders    None       Sherwood Gambler, MD 11/03/18 1505

## 2018-11-03 NOTE — Progress Notes (Signed)
Inpatient neurology team notified of patient arrival from [facility].  Patient seen as acute code stroke by telemedicine neurology.  No TPA due to low stroke scale.  No LVO on exam or CTA head and neck. MRI brain with small right cerebral infarcts in the frontoparietal region.  MRI reviewed. Concern for possible aortic dissection versus artifact on chest imaging per CT VS. From a neurological standpoint, needs stroke work-up. Also regarding anticoagulation: If needed further dissection, can use heparin drip without bolus stroke protocol- small size of acute strokes, low likelihood of hemorrhagic transformation. Stroke team will continue to follow with you in the morning Plan was discussed with Dr. Jonelle Sidle over the phone.  -- Amie Portland, MD [facility] Pager: [phone] If 7pm to 7am, please call on call as listed on [facility]ION.

## 2018-11-03 NOTE — ED Triage Notes (Addendum)
Pt states he woke up with chest pain this morning. He took 1 nitroglycerin which relieved the pain. One hour ( between 1030-1045) ago he reports numbness to L lower arm and hand.

## 2018-11-04 ENCOUNTER — Encounter (HOSPITAL_COMMUNITY): Payer: Self-pay | Admitting: Physician Assistant

## 2018-11-04 ENCOUNTER — Inpatient Hospital Stay (HOSPITAL_COMMUNITY): Payer: Medicaid Other

## 2018-11-04 DIAGNOSIS — I25119 Atherosclerotic heart disease of native coronary artery with unspecified angina pectoris: Secondary | ICD-10-CM

## 2018-11-04 DIAGNOSIS — I340 Nonrheumatic mitral (valve) insufficiency: Secondary | ICD-10-CM

## 2018-11-04 DIAGNOSIS — N400 Enlarged prostate without lower urinary tract symptoms: Secondary | ICD-10-CM

## 2018-11-04 DIAGNOSIS — I639 Cerebral infarction, unspecified: Secondary | ICD-10-CM

## 2018-11-04 DIAGNOSIS — I10 Essential (primary) hypertension: Secondary | ICD-10-CM

## 2018-11-04 LAB — HEMOGLOBIN A1C
Hgb A1c MFr Bld: 5.3 % (ref 4.8–5.6)
Mean Plasma Glucose: 105.41 mg/dL

## 2018-11-04 LAB — CBC
HCT: 30.9 % — ABNORMAL LOW (ref 39.0–52.0)
Hemoglobin: 10 g/dL — ABNORMAL LOW (ref 13.0–17.0)
MCH: 26.5 pg (ref 26.0–34.0)
MCHC: 32.4 g/dL (ref 30.0–36.0)
MCV: 81.7 fL (ref 80.0–100.0)
Platelets: 488 10*3/uL — ABNORMAL HIGH (ref 150–400)
RBC: 3.78 MIL/uL — ABNORMAL LOW (ref 4.22–5.81)
RDW: 16.7 % — ABNORMAL HIGH (ref 11.5–15.5)
WBC: 6.3 10*3/uL (ref 4.0–10.5)
nRBC: 0 % (ref 0.0–0.2)

## 2018-11-04 LAB — LIPID PANEL
Cholesterol: 118 mg/dL (ref 0–200)
HDL: 37 mg/dL — ABNORMAL LOW (ref >40)
LDL Cholesterol: 67 mg/dL (ref 0–99)
Total CHOL/HDL Ratio: 3.2 RATIO
Triglycerides: 70 mg/dL (ref <150)
VLDL: 14 mg/dL (ref 0–40)

## 2018-11-04 LAB — HEPARIN LEVEL (UNFRACTIONATED)
Heparin Unfractionated: <0.1 IU/mL — ABNORMAL LOW (ref 0.30–0.70)
Heparin Unfractionated: <0.1 IU/mL — ABNORMAL LOW (ref 0.30–0.70)
Heparin Unfractionated: 0.14 IU/mL — ABNORMAL LOW (ref 0.30–0.70)

## 2018-11-04 LAB — HIV ANTIBODY (ROUTINE TESTING W REFLEX): HIV Screen 4th Generation wRfx: NONREACTIVE

## 2018-11-04 LAB — ECHOCARDIOGRAM COMPLETE
Height: 73 in
Weight: 2096 oz

## 2018-11-04 MED ORDER — ACETAMINOPHEN 325 MG PO TABS: 650.0000 mg | ORAL_TABLET | ORAL | Status: DC | PRN

## 2018-11-04 MED ORDER — CLOPIDOGREL BISULFATE 75 MG PO TABS
300.0000 mg | ORAL_TABLET | Freq: Once | ORAL | Status: AC
  Administered 2018-11-04: 300 mg via ORAL
  Filled 2018-11-04: qty 4

## 2018-11-04 MED ORDER — ACETAMINOPHEN 650 MG RE SUPP: 650.0000 mg | RECTAL | Status: DC | PRN

## 2018-11-04 MED ORDER — SENNOSIDES-DOCUSATE SODIUM 8.6-50 MG PO TABS: 1.0000 | ORAL_TABLET | Freq: Every evening | ORAL | Status: DC | PRN

## 2018-11-04 MED ORDER — TAMSULOSIN HCL 0.4 MG PO CAPS
0.4000 mg | ORAL_CAPSULE | Freq: Every day | ORAL | Status: DC
  Administered 2018-11-04 – 2018-11-11 (×8): 0.4 mg via ORAL
  Filled 2018-11-04 (×8): qty 1

## 2018-11-04 MED ORDER — ACETAMINOPHEN 160 MG/5ML PO SOLN: 650.0000 mg | ORAL | Status: DC | PRN

## 2018-11-04 MED ORDER — CLOPIDOGREL BISULFATE 75 MG PO TABS
75.0000 mg | ORAL_TABLET | Freq: Every day | ORAL | Status: DC
  Administered 2018-11-05 – 2018-11-12 (×8): 75 mg via ORAL
  Filled 2018-11-04 (×8): qty 1

## 2018-11-04 MED ORDER — SODIUM CHLORIDE 0.9 % IV SOLN
INTRAVENOUS | Status: DC
  Administered 2018-11-04 – 2018-11-07 (×6): via INTRAVENOUS
  Administered 2018-11-07: 1000 mL via INTRAVENOUS

## 2018-11-04 MED ORDER — STROKE: EARLY STAGES OF RECOVERY BOOK
Freq: Once | Status: AC
  Administered 2018-11-04: 1
  Filled 2018-11-04: qty 1

## 2018-11-04 MED ORDER — ENSURE ENLIVE PO LIQD
237.0000 mL | Freq: Two times a day (BID) | ORAL | Status: DC
  Administered 2018-11-04 – 2018-11-05 (×2): 237 mL via ORAL

## 2018-11-04 NOTE — Progress Notes (Addendum)
PROGRESS NOTE    Daniel Weiss  [id number] DOB: 1960/10/27 DOA: 11/03/2018 PCP: Patient, No Pcp Per   Brief Narrative:  Patient is a 58 year old male with history of coronary artery disease status post cardiac cath with angioplasty, ischemic cardiomyopathy with ejection fraction of 35 to 40%, hypertension, tobacco use, hyperlipidemia who presented at [facility] with complaints of left arm tingling, weakness, chest pain.  Imaging suggested acute ischemic stroke, possible clot in the distal ascending aorta and also possible left ventricle. CT coronary showed non-obstructing plaque in all coronary distributions,intimal irregularity with filling defect in the ascending aorta but no LV thrombus. Imagings also suggested  prostate cancer. Patient transferred to [facility].  Currently on heparin drip.  Neurology, cardiology, urology following.  Assessment & Plan:   Principal Problem:   Acute cerebrovascular accident (CVA) ([place]) Active Problems:   Paresthesia   Essential hypertension   Hyponatremia   Hypokalemia   Tobacco abuse   Coronary artery disease involving native coronary artery of native heart with angina pectoris (HCC)   Hyperlipidemia LDL goal <70   Enlarged prostate   Ischemic cardiomyopathy   Acute CVA: MRI showed right-sided frontoparietal infarcts.  Picture suggested embolic nature.  Imaging showed thrombus in the ascending aorta and possibly in  left ventricle.  Started on heparin drip.  Neurology following.  Continue statin. Currently does not have any focal neurological deficits.  He presented with left-sided weakness and tingling/numbness. Will follow up stroke protocol. HbA1c of 5.3. LDL of 67.  Ascending aorta thrombus: Currently on heparin drip.  We will follow-up with cardiology recommendation. CT coronary showed non-obstructing plaque in all coronary distributions,intimal irregularity with filling defect in the ascending aorta but no LV thrombus.  Coronary  artery disease: Denies any chest pain at present.  Status post LAD stenting.  Was on aspirin and Brilinta at home.  Systolic CHF: Last echocardiogram showed ejection fraction of 35 to 40%.  Echocardiogram has been ordered here.  Currently he is euvolemic  High suspicion for prostate cancer: Incidental finding on CT scan.  Urology has been consulted.  Very high PSA.  Imagings also showed possible bone metastasis  Hyperlipidemia: Continue statin  Hypertension: Currently blood stable.  Continue current medicines  Tobacco abuse: Counseled cessation.  Nicotine patch ordered.  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  Currently hemodynamically stable.  Comfortable.  Denies any numbness or tingling or weakness on the left side.  Alert and oriented.  Denies any chest pain  Objective: Vitals:   11/04/18 1115 11/04/18 1130 11/04/18 1145 11/04/18 1200  BP: (!) 151/92 (!) 146/89 (!) 152/92 138/85  Pulse: 71 86 84 78  Resp: (!) 22 (!) 30 (!) 24 (!) 21  Temp:      TempSrc:      SpO2: 97% 96% 97% 97%  Weight:      Height:       No intake or output data in the 24 hours ending 11/04/18 1310 Filed Weights   11/03/18 1121  Weight: 59.4 kg    Examination:  General exam: Appears calm  and comfortable ,Not in distress,average built HEENT:PERRL,Oral mucosa moist, Ear/Nose normal on gross exam,chronic right submandibular mass Respiratory system: Bilateral equal air entry, normal vesicular breath sounds, no wheezes or crackles  Cardiovascular  system: S1 & S2 heard, RRR. No JVD, murmurs, rubs, gallops or clicks. No pedal edema. Gastrointestinal system: Abdomen is nondistended, soft and nontender. No organomegaly or masses felt. Normal bowel sounds heard. Central nervous system: Alert and oriented. No focal neurological deficits. Extremities: No edema, no clubbing ,no cyanosis, distal peripheral pulses palpable. Skin: No rashes, lesions or ulcers,no icterus ,no pallor MSK: Normal muscle bulk,tone ,power Psychiatry: Judgement and insight appear normal. Mood & affect appropriate.     Data Reviewed: I have personally reviewed following labs and imaging studies  CBC: Recent Labs  Lab 11/03/18 1140 11/04/18 0354  WBC 8.0 6.3  NEUTROABS 4.7  --   HGB 10.6* 10.0*  HCT 32.6* 30.9*  MCV 81.1 81.7  PLT 467* [id number]*   Basic Metabolic Panel: Recent Labs  Lab 11/03/18 1140  NA 134*  K 3.8  CL 100  CO2 24  GLUCOSE 107*  BUN 5*  CREATININE 0.77  CALCIUM 8.6*   GFR: Estimated Creatinine Clearance: 84.6 mL/min (by C-G formula based on SCr of 0.77 mg/dL). Liver Function Tests: Recent Labs  Lab 11/03/18 1140  AST 16  ALT 9  ALKPHOS 84  BILITOT 0.4  PROT 6.6  ALBUMIN 2.9*   No results for input(s): LIPASE, AMYLASE in the last 168 hours. No results for input(s): AMMONIA in the last 168 hours. Coagulation Profile: Recent Labs  Lab 11/03/18 1140  INR 1.0   Cardiac Enzymes: No results for input(s): CKTOTAL, CKMB, CKMBINDEX, TROPONINI in the last 168 hours. BNP (last 3 results) No results for input(s): PROBNP in the last 8760 hours. HbA1C: Recent Labs    11/04/18 0354  HGBA1C 5.3   CBG: Recent Labs  Lab 11/03/18 1123  GLUCAP 114*   Lipid Profile: Recent Labs    11/04/18 0354  CHOL 118  HDL 37*  LDLCALC 67  TRIG 70  CHOLHDL 3.2   Thyroid Function Tests: No results for input(s): TSH, T4TOTAL, FREET4, T3FREE, THYROIDAB in the last 72 hours. Anemia Panel: No results for input(s): VITAMINB12, FOLATE,  FERRITIN, TIBC, IRON, RETICCTPCT in the last 72 hours. Sepsis Labs: No results for input(s): PROCALCITON, LATICACIDVEN in the last 168 hours.  Recent Results (from the past 240 hour(s))  SARS CORONAVIRUS 2 (TAT 6-12 HRS) Nasal Swab Aptima Multi Swab     Status: None   Collection Time: 11/03/18 11:57 AM   Specimen: Aptima Multi Swab; Nasal Swab  Result Value Ref Range Status   SARS Coronavirus 2 NEGATIVE NEGATIVE Final    Comment: (NOTE) SARS-CoV-2 target nucleic acids are NOT DETECTED. The SARS-CoV-2 RNA is generally detectable in upper and lower respiratory specimens during the acute phase of infection. Negative results do not preclude SARS-CoV-2 infection, do not rule out co-infections with other pathogens, and should not be used as the sole basis for treatment or other patient management decisions. Negative results must be combined with clinical observations, patient history, and epidemiological information. The expected result is Negative. Fact Sheet for Patients: SugarRoll.be Fact Sheet for Healthcare Providers: https://www.woods-mathews.com/ This test is not yet approved or cleared by the [place] FDA and  has been authorized for detection and/or diagnosis of SARS-CoV-2 by FDA under an Emergency Use Authorization (EUA). This EUA will remain  in effect (meaning this test can be used) for the duration of  the COVID-19 declaration under Section 56 4(b)(1) of the Act, 21 U.S.C. section 360bbb-3(b)(1), unless the authorization is terminated or revoked sooner. Performed at [facility], [place] [address] [place], [place] [address]          Radiology Studies: Ct Angio Head W Or Wo Contrast  Result Date: 11/03/2018 CLINICAL DATA:  Acute onset of chest pain today. Numbness of the left arm and hand. EXAM: CT ANGIOGRAPHY HEAD AND NECK TECHNIQUE: Multidetector CT imaging of the head and neck was performed using the standard  protocol during bolus administration of intravenous contrast. Multiplanar CT image reconstructions and MIPs were obtained to evaluate the vascular anatomy. Carotid stenosis measurements (when applicable) are obtained utilizing NASCET criteria, using the distal internal carotid diameter as the denominator. CONTRAST:  167mL OMNIPAQUE IOHEXOL 350 MG/ML SOLN COMPARISON:  Head CT earlier same day FINDINGS: CTA NECK FINDINGS Aortic arch: Arch is negative. Branching pattern is normal without stenosis. Right carotid system: Common carotid artery widely patent to the bifurcation. No carotid bifurcation stenosis or irregularity. Minimal plaque in the ICA bulb. Left carotid system: Common carotid artery widely patent to the bifurcation. Mild atherosclerotic plaque at the carotid bifurcation and proximal external carotid. No internal carotid stenosis or irregularity. Vertebral arteries: Both vertebral artery origins are patent. Mild atherosclerotic plaque on the right but no flow limiting stenosis. On the left, there is 50% stenosis due to focal calcified plaque. Both vertebral arteries are patent through the cervical region to the foramen magnum. Skeleton: Degenerative cervical spondylosis. Prominent osteophytes/disc herniations at C4-5 and C5-6 that could cause symptomatic stenosis of the canal and or foramina. Other neck: Large right parotid mass involving both the deep lobe and superficial lobe extending inferior from the superficial lobe. This has been seen on multiple previous examinations and is not changing, consistent with benign nature. Axial measurements are unchanged since 2017 at 32 x 43 mm. Upper chest: Negative Review of the MIP images confirms the above findings CTA HEAD FINDINGS Anterior circulation: Both internal carotid arteries are patent through the skull base and siphon regions. The anterior and middle cerebral vessels are patent without proximal stenosis, aneurysm or vascular malformation. No embolic  occlusions are identified. Posterior circulation: Both vertebral arteries are widely patent through the foramen magnum to the basilar. The right is dominant. No basilar stenosis. Posterior circulation branch vessels are patent. Patent posterior communicating arteries on each side. Venous sinuses: Patent and normal. Anatomic variants: None significant. Review of the MIP images confirms the above findings IMPRESSION: No acute vascular finding to explain the clinical presentation. No sign of dissection or occlusion of the brachiocephalic or intracranial vessels. Mild atherosclerotic plaque as above but without flow limiting stenosis in the anterior circulation vessels. Posterior circulation vessels are patent. Atherosclerotic disease at both vertebral artery origins without measurable stenosis on the right and with focal 50% stenosis on the left. 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 Advanced cervical degenerative disease with spinal stenosis and foraminal stenosis at C4-5 and C5-6 that could be significant. Electronically Signed   By: Nelson Chimes M.D.   On: 11/03/2018 13:40   Ct Angio Neck W And/or Wo Contrast  Result Date: 11/03/2018 CLINICAL DATA:  Acute onset of chest pain today. Numbness of the left arm and hand. EXAM: CT ANGIOGRAPHY HEAD AND NECK TECHNIQUE: Multidetector CT imaging of the head and neck was performed using the standard protocol during bolus administration of intravenous contrast.  Multiplanar CT image reconstructions and MIPs were obtained to evaluate the vascular anatomy. Carotid stenosis measurements (when applicable) are obtained utilizing NASCET criteria, using the distal internal carotid diameter as the denominator. CONTRAST:  149mL OMNIPAQUE IOHEXOL 350 MG/ML SOLN COMPARISON:  Head CT earlier same day FINDINGS: CTA NECK FINDINGS Aortic arch: Arch is negative. Branching pattern is normal without stenosis.  Right carotid system: Common carotid artery widely patent to the bifurcation. No carotid bifurcation stenosis or irregularity. Minimal plaque in the ICA bulb. Left carotid system: Common carotid artery widely patent to the bifurcation. Mild atherosclerotic plaque at the carotid bifurcation and proximal external carotid. No internal carotid stenosis or irregularity. Vertebral arteries: Both vertebral artery origins are patent. Mild atherosclerotic plaque on the right but no flow limiting stenosis. On the left, there is 50% stenosis due to focal calcified plaque. Both vertebral arteries are patent through the cervical region to the foramen magnum. Skeleton: Degenerative cervical spondylosis. Prominent osteophytes/disc herniations at C4-5 and C5-6 that could cause symptomatic stenosis of the canal and or foramina. Other neck: Large right parotid mass involving both the deep lobe and superficial lobe extending inferior from the superficial lobe. This has been seen on multiple previous examinations and is not changing, consistent with benign nature. Axial measurements are unchanged since 2017 at 32 x 43 mm. Upper chest: Negative Review of the MIP images confirms the above findings CTA HEAD FINDINGS Anterior circulation: Both internal carotid arteries are patent through the skull base and siphon regions. The anterior and middle cerebral vessels are patent without proximal stenosis, aneurysm or vascular malformation. No embolic occlusions are identified. Posterior circulation: Both vertebral arteries are widely patent through the foramen magnum to the basilar. The right is dominant. No basilar stenosis. Posterior circulation branch vessels are patent. Patent posterior communicating arteries on each side. Venous sinuses: Patent and normal. Anatomic variants: None significant. Review of the MIP images confirms the above findings IMPRESSION: No acute vascular finding to explain the clinical presentation. No sign of dissection  or occlusion of the brachiocephalic or intracranial vessels. Mild atherosclerotic plaque as above but without flow limiting stenosis in the anterior circulation vessels. Posterior circulation vessels are patent. Atherosclerotic disease at both vertebral artery origins without measurable stenosis on the right and with focal 50% stenosis on the left. 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 Advanced cervical degenerative disease with spinal stenosis and foraminal stenosis at C4-5 and C5-6 that could be significant. Electronically Signed   By: Nelson Chimes M.D.   On: 11/03/2018 13:40   Mr Brain Wo Contrast  Result Date: 11/03/2018 CLINICAL DATA:  Left upper extremity weakness/numbness and chest pain. EXAM: MRI HEAD WITHOUT CONTRAST TECHNIQUE: Multiplanar, multiecho pulse sequences of the brain and surrounding structures were obtained without intravenous contrast. COMPARISON:  Head CT 11/03/2018 and MRI 06/30/2015 FINDINGS: Brain: A few subcentimeter acute infarcts are present in the posterior right frontal and right parietal lobes involving cortex and subcortical white matter. Small foci of T2 hyperintensity in the cerebral white matter bilaterally have mildly progressed from 2017 and are nonspecific but compatible with mildly age advanced chronic small vessel ischemic disease. The ventricles and sulci are within normal limits for age. Vascular: Major intracranial vascular flow voids are preserved. Skull and upper cervical spine: Unremarkable bone marrow signal. Sinuses/Orbits: Unremarkable orbits. Minimal scattered mucosal thickening in the paranasal sinuses. Clear mastoid air cells. Other: Partially visualized large right parotid region mass as seen on  multiple prior examinations. IMPRESSION: 1. Small acute right frontoparietal infarcts. 2. Mild chronic small vessel ischemic disease, mildly progressed from 2017. Electronically  Signed   By: Logan Bores M.D.   On: 11/03/2018 18:37   Ct Coronary Morph W/cta Cor W/score W/ca W/cm &/or Wo/cm  Addendum Date: 11/04/2018   ADDENDUM REPORT: 11/04/2018 12:42 CLINICAL DATA:  89M with CAD s/p LAD PCI, chronic systolic and diastolic heart failure, LBBB, hypertension, tobacco abuse and hyperlipidemia admitted with acute embolic stroke and peripheral emboli. EXAM: Cardiac/Coronary  CT TECHNIQUE: The patient was scanned on a [employer]. FINDINGS: A 120 kV prospective scan was triggered in the descending thoracic aorta at 111 HU's. Axial non-contrast 3 mm slices were carried out through the heart. The data set was analyzed on a dedicated work station and scored using the [place]. Gantry rotation speed was 250 msecs and collimation was .6 mm. No beta blockade and 0.8 mg of sl NTG was given. The 3D data set was reconstructed in 5% intervals of the 67-82 % of the R-R cycle. Diastolic phases were analyzed on a dedicated work station using MPR, MIP and VRT modes. The patient received 80 cc of contrast. Aorta: Normal size. Ascending aorta 3.5 cm. No calcifications. There is an intimal irregularity with a filling defect in the ascending aorta as previously described. Normal variant bovine aortic arch noted. Aortic Valve:  Trileaflet.  No calcifications. Coronary Arteries:  Normal coronary origin.  Right dominance. RCA is a large dominant artery that gives rise to PDA and PLVB. There is diffuse minimal (<25%) to mild (25-49%) calcified plaque. Left main is a large artery that gives rise to LAD and LCX arteries. There is minimal (<25%) calcified plaque. LAD is a large vessel. Proximal LAD stent is patent. The proximal LAD is heavily calcified but there is minimal obstruction. There is minimal mixed plaque in the mid to apical LAD with associated positive remodeling. There is mild (25-49%) calcified plaque in proximal D1. LCX is a non-dominant artery. There is moderate calcified plaque in  the proximal to mid LCX. RI has mild (25-49%) calcified plaque in the ostium and proximal regions. Other findings: Normal pulmonary vein drainage into the left atrium. Normal let atrial appendage without a thrombus. Normal size of the pulmonary artery. IMPRESSION: 1. Normal coronary origin with right dominance. 2. Patent proximal LAD stent. 3.  Non-obstructing plaque in all coronary distributions. 4. Intimal irregularity with filling defect in the ascending aorta as noted by Radiology. 5.  No left ventricular thrombus noted. Skeet Latch, MD Electronically Signed   By: Skeet Latch   On: 11/04/2018 12:42   Result Date: 11/04/2018 EXAM: OVER-READ INTERPRETATION  CT CHEST The following report is an over-read performed by radiologist Dr. Rolm Baptise of [facility], [place] on 11/03/2018. This over-read does not include interpretation of cardiac or coronary anatomy or pathology. The coronary CTA interpretation by the cardiologist is attached. COMPARISON:  CTA chest earlier today FINDINGS: Vascular: There is intimal irregularity with filling defect extending into the lumen of the distal ascending thoracic aorta near the proximal aortic arch. This is stable since study earlier today. No evidence of thoracic aortic aneurysm. Mediastinum/Nodes: No mediastinal, hilar, or axillary adenopathy. Lungs/Pleura: Lungs clear.  No effusions. Upper Abdomen: Imaging into the upper abdomen shows no acute findings. Musculoskeletal: Scattered sclerotic densities seen in the thoracic spine as described on prior CT. IMPRESSION: Intimal irregularity with luminal filling defect in the proximal arch/distal ascending thoracic aorta, stable since prior study.  Electronically Signed: By: Rolm Baptise M.D. On: 11/03/2018 17:07   Dg Chest Portable 1 View  Result Date: 11/03/2018 CLINICAL DATA:  Pt states he woke up with chest pain this morning. He took 1 nitroglycerin which relieved the pain. One hour ( between 1030-1045) ago he  reports numbness to L lower arm and hand., hx of TIA, HTN, no other complaints EXAM: PORTABLE CHEST 1 VIEW COMPARISON:  Chest radiographs dated 12/28 2019, 06/29/2015 FINDINGS: The heart size and mediastinal contours are within normal limits. The lungs are clear. No pneumothorax or pleural effusion. No acute abnormality in the visualized skeleton. IMPRESSION: Normal chest radiograph. Electronically Signed   By: Audie Pinto M.D.   On: 11/03/2018 11:44   Ct Angio Chest/abd/pel For Dissection W And/or Wo Contrast  Result Date: 11/03/2018 CLINICAL DATA:  58 year old male with acute chest and back pain as well as stroke-like symptoms concerning for dissection. EXAM: CT ANGIOGRAPHY CHEST, ABDOMEN AND PELVIS TECHNIQUE: Multidetector CT imaging through the chest, abdomen and pelvis was performed using the standard protocol during bolus administration of intravenous contrast. Multiplanar reconstructed images and MIPs were obtained and reviewed to evaluate the vascular anatomy. CONTRAST:  169mL OMNIPAQUE IOHEXOL 350 MG/ML SOLN COMPARISON:  Prior CTA of the chest abdomen and pelvis 03/06/2018 FINDINGS: CTA CHEST FINDINGS Cardiovascular: 2 vessel aortic arch. The right brachiocephalic and left common carotid artery share a common origin. On the unenhanced images, there is no evidence of intramural high attenuation to suggest intramural hematoma. Following administration of intravenous contrast, there is very subtle irregularity of the intima in the ascending thoracic aorta just proximal to the aortic arch. There is a linear irregular filling defect extending into the aortic lumen measuring approximately 1.4 cm in length by 0.2 mm in width. This almost certainly represents an area of linear thrombus adherent to the aortic wall. No evidence of aortic wall thickening, stranding in the mediastinal fat or hemorrhage. The remainder of the aortic arch and descending thoracic aorta are normal. The aortic root is normal. The  heart is normal in size. Calcifications are present along the left main, left anterior descending, circumflex and right coronary artery. No pericardial effusion. Mediastinum/Nodes: Unremarkable CT appearance of the thyroid gland. No suspicious mediastinal or hilar adenopathy. No soft tissue mediastinal mass. The thoracic esophagus is unremarkable. Lungs/Pleura: Lungs are clear. No pleural effusion or pneumothorax. Musculoskeletal: No acute fracture or aggressive appearing lytic or blastic osseous lesion. Small low-attenuation lesion just deep to the skin surface on the right anterior chest wall just above the nipple line likely represents a sebaceous or other benign cutaneous cyst. Review of the MIP images confirms the above findings. CTA ABDOMEN AND PELVIS FINDINGS VASCULAR Aorta: The aorta is normal in caliber without evidence of aneurysm. Compared to the prior study from December of 2019, there has been significant interval progression of fibrofatty atherosclerotic plaque which is become irregular and ulcerated beginning at the aortic hiatus and extending through the visceral, renal and infrarenal segments of the aorta. Just below the renal arteries, the lumen is narrowed by approximately 70%. Celiac: Patent without evidence of aneurysm, dissection, vasculitis or significant stenosis. SMA: Patent without evidence of aneurysm, dissection, vasculitis or significant stenosis. Renals: Both renal arteries are patent without evidence of aneurysm, dissection, vasculitis, fibromuscular dysplasia or significant stenosis. IMA: Patent without evidence of aneurysm, dissection, vasculitis or significant stenosis. Inflow: Heterogeneous calcified and noncalcified atherosclerotic plaque without significant stenosis in either common iliac artery. No evidence of dissection. The left internal iliac artery  is occluded at the origin but ultimately reconstitutes. The external iliac arteries are patent. Of note, a large branch of the  left profunda femoral artery is occluded, likely embolic. Veins: No focal venous abnormality. Review of the MIP images confirms the above findings. NON-VASCULAR Hepatobiliary: Normal morphology. Stable circumscribed low-attenuation lesions which almost certainly represent benign cysts. Gallbladder is unremarkable. No intra or extrahepatic biliary ductal dilatation. Pancreas: Unremarkable. No pancreatic ductal dilatation or surrounding inflammatory changes. Spleen: Normal in size without focal abnormality. Adrenals/Urinary Tract: Normal adrenal glands. No evidence of hydronephrosis, nephrolithiasis or enhancing renal mass. Stomach/Bowel: Stomach is within normal limits. Appendix appears normal. No evidence of bowel wall thickening, distention, or inflammatory changes. Lymphatic: No suspicious lymphadenopathy. Reproductive: Asymmetric enhancement of the right posterolateral prostate gland with patchy enhancement elsewhere. Findings are significantly more conspicuous compared to prior imaging. Other: No abdominal wall hernia or abnormality. No abdominopelvic ascites. Musculoskeletal: Multifocal sclerotic foci present throughout the skeleton. An index lesion in the left hemi sacrum measures slightly larger compared to the prior study at 0.9 cm compared to 0.8 cm previously. A second index lesion in the right transverse process of T1 measures 1.0 cm compared to 0.8 cm previously. Numerous additional lesions are present scattered throughout the thoracic and lumbar spine. These lesions are significantly smaller and not dramatically changed. Lesions are also identified in both iliac bones, bilateral ischial tuberosities, ribs and the right femoral head. The smaller of the 2 femoral head lesions also appears slightly enlarged. Review of the MIP images confirms the above findings. IMPRESSION: CTA CHEST 1. There is a subtle focal intimal irregularity in the ascending thoracic aorta with a 1.4 x 0.2 cm linear thrombus extending  from the focus of irregularity into the aortic lumen. This is just proximal to the origin of the right brachiocephalic artery which supplies both carotid arteries. This lesion is at high risk for embolization which could cause additional large vessel occlusion in the cerebral vasculature. The intimal irregularity could represent the form fruste of an aortic dissection if the patient has significant hypertension although the abnormality is confined to the intima. There is no evidence of thickening or hemorrhage within the aortic media. 2. Left main and 3 vessel coronary artery disease. CTA ABD/PELVIS 1. No evidence of acute aortic dissection or aneurysm. 2. However, there has been significant interval progression of irregular/ulcerated hypoechoic plaque and likely wall adherent mural thrombus within the visceral, renal and infrarenal abdominal aorta. 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 3. 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 There is irregular patchy enhancement throughout the prostate gland more (more on the right than the left) with numerous multifocal sclerotic bone lesions in the pelvis, right femoral head, lumbar and thoracic spine and ribs. At least a few of the small sclerotic foci have subtly enlarged compared to prior imaging. Recommend correlation with serum PSA and referral to urology. These results were called by telephone at the time of interpretation on 11/03/2018 at 1:11 pm to Dr. Sherwood Gambler , who verbally acknowledged these results. Signed, Criselda Peaches, MD, [facility] Radiology Electronically Signed   By: Jacqulynn Cadet M.D.   On: 11/03/2018 13:59   Ct Head Code Stroke Wo Contrast  Result Date: 11/03/2018 CLINICAL DATA:  Code stroke.  Left arm numbness EXAM: CT HEAD WITHOUT  CONTRAST TECHNIQUE: Contiguous axial images were  obtained from the base of the skull through the vertex without intravenous contrast. COMPARISON:  06/30/2015 brain MRI FINDINGS: Brain: No evidence of acute infarction, hemorrhage, hydrocephalus, extra-axial collection or mass lesion/mass effect. Vascular: No hyperdense vessel or unexpected calcification. Skull: Normal. Negative for fracture or focal lesion. Sinuses/Orbits: No acute finding. Other: These results were called by telephone at the time of interpretation on 11/03/2018 at 11:39 am to Dr. Sherwood Gambler , who verbally acknowledged these results. ASPECTS [facility] Stroke Program Early CT Score) - Ganglionic level infarction (caudate, lentiform nuclei, internal capsule, insula, M1-M3 cortex): 7 - Supraganglionic infarction (M4-M6 cortex): 3 Total score (0-10 with 10 being normal): 10 IMPRESSION: Negative head CT.ASPECTS is 10. Electronically Signed   By: Monte Fantasia M.D.   On: 11/03/2018 11:41        Scheduled Meds:  atorvastatin  80 mg Oral Once   nicotine  21 mg Transdermal Daily   Continuous Infusions:  sodium chloride 75 mL/hr at 11/04/18 0842   heparin 950 Units/hr (11/04/18 0842)     LOS: 1 day    Time spent: 35 mins.More than 50% of that time was spent in counseling and/or coordination of care.      Shelly Coss, MD [facility] Pager [phone]  If 7PM-7AM, please contact night-coverage [email or web address] Password [email or web address] 11/04/2018, 1:10 PM

## 2018-11-04 NOTE — Progress Notes (Addendum)
      [address]       [place] [address]             [phone]     CARDIOTHORACIC SURGERY PROGRESS NOTE  Subjective: Feels fine.  Left arm and hand numbness has resolved.  No chest pain.  Objective: Vital signs in last 24 hours: Temp:  [98.1 F (36.7 C)-99 F (37.2 C)] 99 F (37.2 C) (08/26 1517) Pulse Rate:  [58-87] 70 (08/27 0830) Cardiac Rhythm: Normal sinus rhythm (08/27 0759) Resp:  [11-28] 15 (08/27 0830) BP: (106-167)/(67-115) 150/108 (08/27 0830) SpO2:  [96 %-100 %] 99 % (08/27 0830) Weight:  [59.4 kg] 59.4 kg (08/26 1121)  Physical Exam:  Rhythm:   sinus  Breath sounds: clear  Heart sounds:  RRR  Incisions:  n/a  Abdomen:  soft  Extremities:  Warm  Neuro:   Non-focal   Intake/Output from previous day: No intake/output data recorded. Intake/Output this shift: No intake/output data recorded.  Lab Results: Recent Labs    11/03/18 1140 11/04/18 0354  WBC 8.0 6.3  HGB 10.6* 10.0*  HCT 32.6* 30.9*  PLT 467* 488*   BMET:  Recent Labs    11/03/18 1140  NA 134*  K 3.8  CL 100  CO2 24  GLUCOSE 107*  BUN 5*  CREATININE 0.77  CALCIUM 8.6*    CBG (last 3)  Recent Labs    11/03/18 1123  GLUCAP 114*   PT/INR:   Recent Labs    11/03/18 1140  LABPROT 13.0  INR 1.0    CXR:  N/A   Assessment/Plan:  Clinically stable and presenting symptoms have resolved.  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  Unclear whether or not filling defect seen in distal ascending aorta represents spontaneous thrombosis vs atherosclerotic plaque vs vegitation.  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  It is clearly mobile and attached to the aortic wall.  I would not consider surgical exploration for removal of clot while patient is anticoagulated using Brilinta, and I suspect that best treatment plan will be anticoagulation using warfarin without any type of surgical intervention.  Patient should  undergo full workup for possible hypercoagulable state, prostate cancer, and bacterial endocarditis.  I favor stopping Brilinta and proceeding with medical workup.  Patient has not yet been seen by Cardiology team (patient is followed by Dr Ellyn Hack).    Will follow.   I spent in excess of 15 minutes during the conduct of this hospital encounter and >50% of this time involved direct face-to-face encounter with the patient for counseling and/or coordination of their care.           Rexene Alberts, MD 11/04/2018 8:54 AM

## 2018-11-04 NOTE — ED Notes (Signed)
ED TO INPATIENT HANDOFF REPORT  ED Nurse Name and Phone #:  Arby Barrette RN [phone]  S Name/Age/Gender Daniel Weiss 58 y.o. male Room/Bed: 030C/030C  Code Status   Code Status: Full Code  Home/SNF/Other Home Patient oriented to: self, place, time and situation Is this baseline? Yes   Triage Complete: Triage complete  Chief Complaint CHEST PAIN, LEFT ARM NUMBNESS  Triage Note Pt states he woke up with chest pain this morning. He took 1 nitroglycerin which relieved the pain. One hour ( between 1030-1045) ago he reports numbness to L lower arm and hand.    Allergies No Known Allergies  Level of Care/Admitting Diagnosis ED Disposition    ED Disposition Condition [facility]: [place] [[id number]]  Level of Care: Progressive [102]  Covid Evaluation: Asymptomatic Screening Protocol (No Symptoms)  [facility]: [id number]  Admitting Physician: Elwyn Reach [[id number]]  Attending Physician: Elwyn Reach [[id number]]  Estimated length of stay: past midnight tomorrow  Certification:: I certify this patient will need inpatient services for at least 2 midnights  PT Class (Do Not Modify): Inpatient [101]  PT Acc Code (Do Not Modify): Private [1]       B Medical/Surgery History Past Medical History:  Diagnosis Date  . Abnormal chest CT 02/2018   a. Ectatic 3 cm Ao arch - rec f/u outpt imaging w/ CTA or MRA. Ectatic atheromatous abd Ao @ risk for aneurysm - rec f/u u/s in 5 yrs. Marked prostatic enlargement w/ scattered small sclerotic foci in the thoracic lower lumbar spine, sacrum, left 12th rib, pelvis, and right femur.  Recommend elective outpatient whole-body bone scintigraphy and PSA.  Marland Kitchen CAD (coronary artery disease)    a. 02/2018 ACS/PCI: LM nl, LAD 85p (3.5x15 Sierra DES), D1 45ost, RI 55ost, RCA nl, EF 25-35%.  . Complete left bundle branch block (LBBB) 2016  . Current every day smoker   . HFrEF (heart  failure with reduced ejection fraction) ([place])   . Hypertension   . Ischemic cardiomyopathy 02/2018   a. 02/2018 Echo: EF 35-40% (LV gram on cath was 25-30%), mod, diff HK. mid-apicalanteroseptal, ant, and apical sev HK. RVH.  Marland Kitchen TIA (transient ischemic attack) 2016  . Warthin's tumor    Past Surgical History:  Procedure Laterality Date  . CORONARY STENT INTERVENTION N/A 03/06/2018   Procedure: CORONARY STENT INTERVENTION;  Surgeon: [name], MD;  Location: [facility];  Service: Cardiovascular: Proximal LAD 85% stenosis (and D1): DES PCI with Xience Sierra DES 3.5 mm x 15 mm - 3.9 mm  . INTRAOPERATIVE TRANSTHORACIC ECHOCARDIOGRAM  03/06/2018   (Peri-MI) mild reduced EF 35-40%.  Diffuse hypokinesis (severe HK of the mid-apical anteroseptal, anterior and apical myocardium consistent with LAD infarct).  Unable to assess diastolic function.  Paradoxical septal motion likely related to his LBBB.  RV hypertrophy noted.  Trivial pericardial effusion noted.   Marland Kitchen LEFT HEART CATH AND CORONARY ANGIOGRAPHY N/A 03/06/2018   Procedure: LEFT HEART CATH AND CORONARY ANGIOGRAPHY;  Surgeon: [name], MD;  Location: [facility];  Service: Cardiovascular: Proximal LAD 85% involving D1 45%.  Ostial RI 55%.  Severe LV dysfunction EF 25 to 30%.  1+ MR.  Only minimally elevated LVEDP.     A IV Location/Drains/Wounds Patient Lines/Drains/Airways Status   Active Line/Drains/Airways    Name:   Placement date:   Placement time:   Site:   Days:   Peripheral IV  11/03/18 Right Forearm   11/03/18    1143    Forearm   1   Peripheral IV 11/03/18 Left Wrist   11/03/18    1310    Wrist   1          Intake/Output Last 24 hours No intake or output data in the 24 hours ending 11/04/18 1140  Labs/Imaging Results for orders placed or performed during the hospital encounter of 11/03/18 (from the past 48 hour(s))  CBG monitoring, ED     Status: Abnormal   Collection Time: 11/03/18 11:23 AM   Result Value Ref Range   Glucose-Capillary 114 (H) 70 - 99 mg/dL   Comment 1 Notify RN    Comment 2 Document in Chart   Ethanol     Status: None   Collection Time: 11/03/18 11:40 AM  Result Value Ref Range   Alcohol, Ethyl (B)        <10 mg/dL    Comment: LOWEST DETECTABLE LIMIT FOR SERUM ALCOHOL IS 10 mg/dL FOR MEDICAL PURPOSES ONLY (NOTE) Lowest detectable limit for serum alcohol is 10 mg/dL. For medical purposes only. Performed at [facility], [address] [place], [place] [address]   Protime-INR     Status: None   Collection Time: 11/03/18 11:40 AM  Result Value Ref Range   Prothrombin Time 13.0 11.4 - 15.2 seconds   INR 1.0 0.8 - 1.2    Comment: (NOTE) INR goal varies based on device and disease states. Performed at [facility], [address] [place], [place] [address]   APTT     Status: None   Collection Time: 11/03/18 11:40 AM  Result Value Ref Range   aPTT 27 24 - 36 seconds    Comment: Performed at [facility], [address] [place], [place] [address]  CBC     Status: Abnormal   Collection Time: 11/03/18 11:40 AM  Result Value Ref Range   WBC 8.0 4.0 - 10.5 K/uL   RBC 4.02 (L) 4.22 - 5.81 MIL/uL   Hemoglobin 10.6 (L) 13.0 - 17.0 g/dL   HCT 32.6 (L) 39.0 - 52.0 %   MCV 81.1 80.0 - 100.0 fL   MCH 26.4 26.0 - 34.0 pg   MCHC 32.5 30.0 - 36.0 g/dL   RDW 16.8 (H) 11.5 - 15.5 %   Platelets 467 (H) 150 - 400 K/uL   nRBC 0.0 0.0 - 0.2 %    Comment: Performed at [facility], [address] [place], [place] [address]  Differential     Status: Abnormal   Collection Time: 11/03/18 11:40 AM  Result Value Ref Range   Neutrophils Relative % 58 %   Neutro Abs 4.7 1.7 - 7.7 K/uL   Lymphocytes Relative 26 %   Lymphs Abs 2.1 0.7 - 4.0 K/uL   Monocytes Relative 15 %   Monocytes Absolute 1.2 (H) 0.1 - 1.0 K/uL   Eosinophils Relative 0 %   Eosinophils Absolute 0.0 0.0 - 0.5 K/uL   Basophils Relative 1 %   Basophils  Absolute 0.0 0.0 - 0.1 K/uL   Immature Granulocytes 0 %   Abs Immature Granulocytes 0.03 0.00 - 0.07 K/uL    Comment: Performed at [facility], [address] [place], [place] [address]  Comprehensive metabolic panel     Status: Abnormal   Collection Time: 11/03/18 11:40 AM  Result Value Ref Range   Sodium  134 (L) 135 - 145 mmol/L   Potassium 3.8 3.5 - 5.1 mmol/L   Chloride 100 98 - 111 mmol/L   CO2 24 22 - 32 mmol/L   Glucose, Bld 107 (H) 70 - 99 mg/dL   BUN 5 (L) 6 - 20 mg/dL   Creatinine, Ser 0.77 0.61 - 1.24 mg/dL   Calcium 8.6 (L) 8.9 - 10.3 mg/dL   Total Protein 6.6 6.5 - 8.1 g/dL   Albumin 2.9 (L) 3.5 - 5.0 g/dL   AST 16 15 - 41 U/L   ALT 9 0 - 44 U/L   Alkaline Phosphatase 84 38 - 126 U/L   Total Bilirubin 0.4 0.3 - 1.2 mg/dL   GFR calc non Af Amer >60 >60 mL/min   GFR calc Af Amer >60 >60 mL/min   Anion gap 10 5 - 15    Comment: Performed at [facility], [address] [place], [place] [address]  Troponin I (High Sensitivity)     Status: Abnormal   Collection Time: 11/03/18 11:40 AM  Result Value Ref Range   Troponin I (High Sensitivity) 53 (H) <18 ng/L    Comment: (NOTE) Elevated high sensitivity troponin I (hsTnI) values and significant  changes across serial measurements may suggest ACS but many other  chronic and acute conditions are known to elevate hsTnI results.  Refer to the "Links" section for chest pain algorithms and additional  guidance. Performed at [facility], [address] [place], [place] [address]   PSA     Status: Abnormal   Collection Time: 11/03/18 11:40 AM  Result Value Ref Range   Prostatic Specific Antigen 502.00 (H) 0.00 - 4.00 ng/mL    Comment: (NOTE) While PSA levels of <=4.0 ng/ml are reported as reference range, some men with levels below 4.0 ng/ml can have prostate cancer and many men with PSA above 4.0 ng/ml do not have prostate cancer.  Other tests such as free PSA, age specific reference  ranges, PSA velocity and PSA doubling time may be helpful especially in men less than 82 years old. Performed at [facility], [place] [address] [place], [place] [address]   SARS CORONAVIRUS 2 (TAT 6-12 HRS) Nasal Swab Aptima Multi Swab     Status: None   Collection Time: 11/03/18 11:57 AM   Specimen: Aptima Multi Swab; Nasal Swab  Result Value Ref Range   SARS Coronavirus 2 NEGATIVE NEGATIVE    Comment: (NOTE) SARS-CoV-2 target nucleic acids are NOT DETECTED. The SARS-CoV-2 RNA is generally detectable in upper and lower respiratory specimens during the acute phase of infection. Negative results do not preclude SARS-CoV-2 infection, do not rule out co-infections with other pathogens, and should not be used as the sole basis for treatment or other patient management decisions. Negative results must be combined with clinical observations, patient history, and epidemiological information. The expected result is Negative. Fact Sheet for Patients: SugarRoll.be Fact Sheet for Healthcare Providers: https://www.woods-mathews.com/ This test is not yet approved or cleared by the [place] FDA and  has been authorized for detection and/or diagnosis of SARS-CoV-2 by FDA under an Emergency Use Authorization (EUA). This EUA will remain  in effect (meaning this test can be used) for the duration of the COVID-19 declaration under Section 56 4(b)(1) of the Act, 21 U.S.C. section 360bbb-3(b)(1), unless the authorization is terminated or revoked sooner. Performed at [facility], [place] [address] [place], [place] [address]   Urine rapid drug  screen (hosp performed)     Status: Abnormal   Collection Time: 11/03/18  1:04 PM  Result Value Ref Range   Opiates NONE DETECTED NONE DETECTED   Cocaine NONE DETECTED NONE DETECTED   Benzodiazepines NONE DETECTED NONE DETECTED   Amphetamines NONE DETECTED NONE DETECTED   Tetrahydrocannabinol POSITIVE  (A) NONE DETECTED   Barbiturates NONE DETECTED NONE DETECTED    Comment: (NOTE) DRUG SCREEN FOR MEDICAL PURPOSES ONLY.  IF CONFIRMATION IS NEEDED FOR ANY PURPOSE, NOTIFY LAB WITHIN 5 DAYS. LOWEST DETECTABLE LIMITS FOR URINE DRUG SCREEN Drug Class                     Cutoff (ng/mL) Amphetamine and metabolites    1000 Barbiturate and metabolites    200 Benzodiazepine                 [id number] Tricyclics and metabolites     300 Opiates and metabolites        300 Cocaine and metabolites        300 THC                            50 Performed at [facility], [address] [place], [place] [address]   Urinalysis, Routine w reflex microscopic     Status: Abnormal   Collection Time: 11/03/18  1:04 PM  Result Value Ref Range   Color, Urine YELLOW YELLOW   APPearance CLEAR CLEAR   Specific Gravity, Urine <1.005 (L) 1.005 - 1.030   pH 6.5 5.0 - 8.0   Glucose, UA NEGATIVE NEGATIVE mg/dL   Hgb urine dipstick TRACE (A) NEGATIVE   Bilirubin Urine NEGATIVE NEGATIVE   Ketones, ur NEGATIVE NEGATIVE mg/dL   Protein, ur NEGATIVE NEGATIVE mg/dL   Nitrite NEGATIVE NEGATIVE   Leukocytes,Ua NEGATIVE NEGATIVE    Comment: Performed at [facility] [address] [place], [place] [address]  Urinalysis, Microscopic (reflex)     Status: Abnormal   Collection Time: 11/03/18  1:04 PM  Result Value Ref Range   RBC / HPF 0-5 0 - 5 RBC/hpf   WBC, UA 0-5 0 - 5 WBC/hpf   Bacteria, UA RARE (A) NONE SEEN   Squamous Epithelial / LPF 0-5 0 - 5   Sperm, UA PRESENT     Comment: Performed at [facility], [address] [place], [place] [address]  Troponin I (High Sensitivity)     Status: Abnormal   Collection Time: 11/03/18  1:25 PM  Result Value Ref Range   Troponin I (High Sensitivity) 46 (H) <18 ng/L    Comment: (NOTE) Elevated high sensitivity troponin I (hsTnI) values and significant  changes across serial measurements may suggest ACS but many other  chronic and acute  conditions are known to elevate hsTnI results.  Refer to the "Links" section for chest pain algorithms and additional  guidance. Performed at [facility], [address] [place], [place] [address]   Troponin I (High Sensitivity)     Status: Abnormal   Collection Time: 11/03/18  3:35 PM  Result Value Ref Range   Troponin I (High Sensitivity) 73 (H) <18 ng/L    Comment: (NOTE) Elevated high sensitivity troponin I (hsTnI) values and significant  changes across serial measurements may suggest ACS but many other  chronic and acute conditions are known to elevate hsTnI results.  Refer to the "  Links" section for chest pain algorithms and additional  guidance. Performed at [facility], [place] [address], [place], [place] [address]   Troponin I (High Sensitivity)     Status: Abnormal   Collection Time: 11/03/18  6:57 PM  Result Value Ref Range   Troponin I (High Sensitivity) 67 (H) <18 ng/L    Comment: (NOTE) Elevated high sensitivity troponin I (hsTnI) values and significant  changes across serial measurements may suggest ACS but many other  chronic and acute conditions are known to elevate hsTnI results.  Refer to the "Links" section for chest pain algorithms and additional  guidance. Performed at [facility], [place] [address], [place], [place] [address]   Heparin level (unfractionated)     Status: Abnormal   Collection Time: 11/04/18  3:54 AM  Result Value Ref Range   Heparin Unfractionated <0.10 (L) 0.30 - 0.70 IU/mL    Comment: REPEATED TO VERIFY (NOTE) If heparin results are below expected values, and patient dosage has  been confirmed, suggest follow up testing of antithrombin III levels. Performed at [facility], [place] [address], [place], [place] [address]   CBC     Status: Abnormal   Collection Time: 11/04/18  3:54 AM  Result Value Ref Range   WBC 6.3 4.0 - 10.5 K/uL   RBC 3.78 (L) 4.22 - 5.81 MIL/uL   Hemoglobin 10.0 (L) 13.0 - 17.0 g/dL    HCT 30.9 (L) 39.0 - 52.0 %   MCV 81.7 80.0 - 100.0 fL   MCH 26.5 26.0 - 34.0 pg   MCHC 32.4 30.0 - 36.0 g/dL   RDW 16.7 (H) 11.5 - 15.5 %   Platelets 488 (H) 150 - 400 K/uL   nRBC 0.0 0.0 - 0.2 %    Comment: Performed at [facility] [address], [place], [place] [address]  Hemoglobin A1c     Status: None   Collection Time: 11/04/18  3:54 AM  Result Value Ref Range   Hgb A1c MFr Bld 5.3 4.8 - 5.6 %    Comment: (NOTE) Pre diabetes:          5.7%-6.4% Diabetes:              >6.4% Glycemic control for   <7.0% adults with diabetes    Mean Plasma Glucose 105.41 mg/dL    Comment: Performed at [facility] [address], [place], [place] [address]  Lipid panel     Status: Abnormal   Collection Time: 11/04/18  3:54 AM  Result Value Ref Range   Cholesterol 118 0 - 200 mg/dL   Triglycerides 70 <150 mg/dL   HDL 37 (L) >40 mg/dL   Total CHOL/HDL Ratio 3.2 RATIO   VLDL 14 0 - 40 mg/dL   LDL Cholesterol 67 0 - 99 mg/dL    Comment:        Total Cholesterol/HDL:CHD Risk Coronary Heart Disease Risk Table                     Men   Women  1/2 Average Risk   3.4   3.3  Average Risk       5.0   4.4  2 X Average Risk   9.6   7.1  3 X Average Risk  23.4   11.0        Use the calculated Patient Ratio above and the CHD Risk Table to determine the patient's CHD Risk.  ATP III CLASSIFICATION (LDL):  <100     mg/dL   Optimal  100-129  mg/dL   Near or Above                    Optimal  130-159  mg/dL   Borderline  160-189  mg/dL   High  >190     mg/dL   Very High Performed at [facility] [address], [place], [place] [address]    Ct Angio Head W Or Wo Contrast  Result Date: 11/03/2018 CLINICAL DATA:  Acute onset of chest pain today. Numbness of the left arm and hand. EXAM: CT ANGIOGRAPHY HEAD AND NECK TECHNIQUE: Multidetector CT imaging of the head and neck was performed using the standard protocol during bolus administration of intravenous contrast.  Multiplanar CT image reconstructions and MIPs were obtained to evaluate the vascular anatomy. Carotid stenosis measurements (when applicable) are obtained utilizing NASCET criteria, using the distal internal carotid diameter as the denominator. CONTRAST:  180mL OMNIPAQUE IOHEXOL 350 MG/ML SOLN COMPARISON:  Head CT earlier same day FINDINGS: CTA NECK FINDINGS Aortic arch: Arch is negative. Branching pattern is normal without stenosis. Right carotid system: Common carotid artery widely patent to the bifurcation. No carotid bifurcation stenosis or irregularity. Minimal plaque in the ICA bulb. Left carotid system: Common carotid artery widely patent to the bifurcation. Mild atherosclerotic plaque at the carotid bifurcation and proximal external carotid. No internal carotid stenosis or irregularity. Vertebral arteries: Both vertebral artery origins are patent. Mild atherosclerotic plaque on the right but no flow limiting stenosis. On the left, there is 50% stenosis due to focal calcified plaque. Both vertebral arteries are patent through the cervical region to the foramen magnum. Skeleton: Degenerative cervical spondylosis. Prominent osteophytes/disc herniations at C4-5 and C5-6 that could cause symptomatic stenosis of the canal and or foramina. Other neck: Large right parotid mass involving both the deep lobe and superficial lobe extending inferior from the superficial lobe. This has been seen on multiple previous examinations and is not changing, consistent with benign nature. Axial measurements are unchanged since 2017 at 32 x 43 mm. Upper chest: Negative Review of the MIP images confirms the above findings CTA HEAD FINDINGS Anterior circulation: Both internal carotid arteries are patent through the skull base and siphon regions. The anterior and middle cerebral vessels are patent without proximal stenosis, aneurysm or vascular malformation. No embolic occlusions are identified. Posterior circulation: Both vertebral  arteries are widely patent through the foramen magnum to the basilar. The right is dominant. No basilar stenosis. Posterior circulation branch vessels are patent. Patent posterior communicating arteries on each side. Venous sinuses: Patent and normal. Anatomic variants: None significant. Review of the MIP images confirms the above findings IMPRESSION: No acute vascular finding to explain the clinical presentation. No sign of dissection or occlusion of the brachiocephalic or intracranial vessels. Mild atherosclerotic plaque as above but without flow limiting stenosis in the anterior circulation vessels. Posterior circulation vessels are patent. Atherosclerotic disease at both vertebral artery origins without measurable stenosis on the right and with focal 50% stenosis on the left. 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 Advanced cervical degenerative disease with spinal stenosis and foraminal stenosis at C4-5 and C5-6 that could be significant. Electronically Signed   By: Nelson Chimes M.D.   On: 11/03/2018 13:40   Ct Angio Neck W And/or Wo Contrast  Result Date: 11/03/2018 CLINICAL DATA:  Acute onset  of chest pain today. Numbness of the left arm and hand. EXAM: CT ANGIOGRAPHY HEAD AND NECK TECHNIQUE: Multidetector CT imaging of the head and neck was performed using the standard protocol during bolus administration of intravenous contrast. Multiplanar CT image reconstructions and MIPs were obtained to evaluate the vascular anatomy. Carotid stenosis measurements (when applicable) are obtained utilizing NASCET criteria, using the distal internal carotid diameter as the denominator. CONTRAST:  152mL OMNIPAQUE IOHEXOL 350 MG/ML SOLN COMPARISON:  Head CT earlier same day FINDINGS: CTA NECK FINDINGS Aortic arch: Arch is negative. Branching pattern is normal without stenosis. Right carotid system: Common carotid artery widely patent to the  bifurcation. No carotid bifurcation stenosis or irregularity. Minimal plaque in the ICA bulb. Left carotid system: Common carotid artery widely patent to the bifurcation. Mild atherosclerotic plaque at the carotid bifurcation and proximal external carotid. No internal carotid stenosis or irregularity. Vertebral arteries: Both vertebral artery origins are patent. Mild atherosclerotic plaque on the right but no flow limiting stenosis. On the left, there is 50% stenosis due to focal calcified plaque. Both vertebral arteries are patent through the cervical region to the foramen magnum. Skeleton: Degenerative cervical spondylosis. Prominent osteophytes/disc herniations at C4-5 and C5-6 that could cause symptomatic stenosis of the canal and or foramina. Other neck: Large right parotid mass involving both the deep lobe and superficial lobe extending inferior from the superficial lobe. This has been seen on multiple previous examinations and is not changing, consistent with benign nature. Axial measurements are unchanged since 2017 at 32 x 43 mm. Upper chest: Negative Review of the MIP images confirms the above findings CTA HEAD FINDINGS Anterior circulation: Both internal carotid arteries are patent through the skull base and siphon regions. The anterior and middle cerebral vessels are patent without proximal stenosis, aneurysm or vascular malformation. No embolic occlusions are identified. Posterior circulation: Both vertebral arteries are widely patent through the foramen magnum to the basilar. The right is dominant. No basilar stenosis. Posterior circulation branch vessels are patent. Patent posterior communicating arteries on each side. Venous sinuses: Patent and normal. Anatomic variants: None significant. Review of the MIP images confirms the above findings IMPRESSION: No acute vascular finding to explain the clinical presentation. No sign of dissection or occlusion of the brachiocephalic or intracranial vessels.  Mild atherosclerotic plaque as above but without flow limiting stenosis in the anterior circulation vessels. Posterior circulation vessels are patent. Atherosclerotic disease at both vertebral artery origins without measurable stenosis on the right and with focal 50% stenosis on the left. 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 Advanced cervical degenerative disease with spinal stenosis and foraminal stenosis at C4-5 and C5-6 that could be significant. Electronically Signed   By: Nelson Chimes M.D.   On: 11/03/2018 13:40   Mr Brain Wo Contrast  Result Date: 11/03/2018 CLINICAL DATA:  Left upper extremity weakness/numbness and chest pain. EXAM: MRI HEAD WITHOUT CONTRAST TECHNIQUE: Multiplanar, multiecho pulse sequences of the brain and surrounding structures were obtained without intravenous contrast. COMPARISON:  Head CT 11/03/2018 and MRI 06/30/2015 FINDINGS: Brain: A few subcentimeter acute infarcts are present in the posterior right frontal and right parietal lobes involving cortex and subcortical white matter. Small foci of T2 hyperintensity in the cerebral white matter bilaterally have mildly progressed from 2017 and are nonspecific but compatible with mildly age advanced chronic small vessel ischemic disease. The ventricles and sulci are within normal limits for age. Vascular: Major intracranial vascular flow  voids are preserved. Skull and upper cervical spine: Unremarkable bone marrow signal. Sinuses/Orbits: Unremarkable orbits. Minimal scattered mucosal thickening in the paranasal sinuses. Clear mastoid air cells. Other: Partially visualized large right parotid region mass as seen on multiple prior examinations. IMPRESSION: 1. Small acute right frontoparietal infarcts. 2. Mild chronic small vessel ischemic disease, mildly progressed from 2017. Electronically Signed   By: Logan Bores M.D.   On: 11/03/2018 18:37   Ct  Coronary Morph W/cta Cor W/score W/ca W/cm &/or Wo/cm  Result Date: 11/03/2018 EXAM: OVER-READ INTERPRETATION  CT CHEST The following report is an over-read performed by radiologist Dr. Rolm Baptise of [facility], [place] on 11/03/2018. This over-read does not include interpretation of cardiac or coronary anatomy or pathology. The coronary CTA interpretation by the cardiologist is attached. COMPARISON:  CTA chest earlier today FINDINGS: Vascular: There is intimal irregularity with filling defect extending into the lumen of the distal ascending thoracic aorta near the proximal aortic arch. This is stable since study earlier today. No evidence of thoracic aortic aneurysm. Mediastinum/Nodes: No mediastinal, hilar, or axillary adenopathy. Lungs/Pleura: Lungs clear.  No effusions. Upper Abdomen: Imaging into the upper abdomen shows no acute findings. Musculoskeletal: Scattered sclerotic densities seen in the thoracic spine as described on prior CT. IMPRESSION: Intimal irregularity with luminal filling defect in the proximal arch/distal ascending thoracic aorta, stable since prior study. Electronically Signed   By: Rolm Baptise M.D.   On: 11/03/2018 17:07   Dg Chest Portable 1 View  Result Date: 11/03/2018 CLINICAL DATA:  Pt states he woke up with chest pain this morning. He took 1 nitroglycerin which relieved the pain. One hour ( between 1030-1045) ago he reports numbness to L lower arm and hand., hx of TIA, HTN, no other complaints EXAM: PORTABLE CHEST 1 VIEW COMPARISON:  Chest radiographs dated 12/28 2019, 06/29/2015 FINDINGS: The heart size and mediastinal contours are within normal limits. The lungs are clear. No pneumothorax or pleural effusion. No acute abnormality in the visualized skeleton. IMPRESSION: Normal chest radiograph. Electronically Signed   By: Audie Pinto M.D.   On: 11/03/2018 11:44   Ct Angio Chest/abd/pel For Dissection W And/or Wo Contrast  Result Date: 11/03/2018 CLINICAL DATA:   58 year old male with acute chest and back pain as well as stroke-like symptoms concerning for dissection. EXAM: CT ANGIOGRAPHY CHEST, ABDOMEN AND PELVIS TECHNIQUE: Multidetector CT imaging through the chest, abdomen and pelvis was performed using the standard protocol during bolus administration of intravenous contrast. Multiplanar reconstructed images and MIPs were obtained and reviewed to evaluate the vascular anatomy. CONTRAST:  166mL OMNIPAQUE IOHEXOL 350 MG/ML SOLN COMPARISON:  Prior CTA of the chest abdomen and pelvis 03/06/2018 FINDINGS: CTA CHEST FINDINGS Cardiovascular: 2 vessel aortic arch. The right brachiocephalic and left common carotid artery share a common origin. On the unenhanced images, there is no evidence of intramural high attenuation to suggest intramural hematoma. Following administration of intravenous contrast, there is very subtle irregularity of the intima in the ascending thoracic aorta just proximal to the aortic arch. There is a linear irregular filling defect extending into the aortic lumen measuring approximately 1.4 cm in length by 0.2 mm in width. This almost certainly represents an area of linear thrombus adherent to the aortic wall. No evidence of aortic wall thickening, stranding in the mediastinal fat or hemorrhage. The remainder of the aortic arch and descending thoracic aorta are normal. The aortic root is normal. The heart is normal in size. Calcifications are present along the left main, left  anterior descending, circumflex and right coronary artery. No pericardial effusion. Mediastinum/Nodes: Unremarkable CT appearance of the thyroid gland. No suspicious mediastinal or hilar adenopathy. No soft tissue mediastinal mass. The thoracic esophagus is unremarkable. Lungs/Pleura: Lungs are clear. No pleural effusion or pneumothorax. Musculoskeletal: No acute fracture or aggressive appearing lytic or blastic osseous lesion. Small low-attenuation lesion just deep to the skin surface  on the right anterior chest wall just above the nipple line likely represents a sebaceous or other benign cutaneous cyst. Review of the MIP images confirms the above findings. CTA ABDOMEN AND PELVIS FINDINGS VASCULAR Aorta: The aorta is normal in caliber without evidence of aneurysm. Compared to the prior study from December of 2019, there has been significant interval progression of fibrofatty atherosclerotic plaque which is become irregular and ulcerated beginning at the aortic hiatus and extending through the visceral, renal and infrarenal segments of the aorta. Just below the renal arteries, the lumen is narrowed by approximately 70%. Celiac: Patent without evidence of aneurysm, dissection, vasculitis or significant stenosis. SMA: Patent without evidence of aneurysm, dissection, vasculitis or significant stenosis. Renals: Both renal arteries are patent without evidence of aneurysm, dissection, vasculitis, fibromuscular dysplasia or significant stenosis. IMA: Patent without evidence of aneurysm, dissection, vasculitis or significant stenosis. Inflow: Heterogeneous calcified and noncalcified atherosclerotic plaque without significant stenosis in either common iliac artery. No evidence of dissection. The left internal iliac artery is occluded at the origin but ultimately reconstitutes. The external iliac arteries are patent. 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 Veins: No focal venous abnormality. Review of the MIP images confirms the above findings. NON-VASCULAR Hepatobiliary: Normal morphology. Stable circumscribed low-attenuation lesions which almost certainly represent benign cysts. Gallbladder is unremarkable. No intra or extrahepatic biliary ductal dilatation. Pancreas: Unremarkable. No pancreatic ductal dilatation or surrounding inflammatory changes. Spleen: Normal in size without focal abnormality. Adrenals/Urinary Tract: Normal adrenal glands. No evidence  of hydronephrosis, nephrolithiasis or enhancing renal mass. Stomach/Bowel: Stomach is within normal limits. Appendix appears normal. No evidence of bowel wall thickening, distention, or inflammatory changes. Lymphatic: No suspicious lymphadenopathy. Reproductive: Asymmetric enhancement of the right posterolateral prostate gland with patchy enhancement elsewhere. Findings are significantly more conspicuous compared to prior imaging. Other: No abdominal wall hernia or abnormality. No abdominopelvic ascites. Musculoskeletal: Multifocal sclerotic foci present throughout the skeleton. An index lesion in the left hemi sacrum measures slightly larger compared to the prior study at 0.9 cm compared to 0.8 cm previously. A second index lesion in the right transverse process of T1 measures 1.0 cm compared to 0.8 cm previously. Numerous additional lesions are present scattered throughout the thoracic and lumbar spine. These lesions are significantly smaller and not dramatically changed. Lesions are also identified in both iliac bones, bilateral ischial tuberosities, ribs and the right femoral head. The smaller of the 2 femoral head lesions also appears slightly enlarged. Review of the MIP images confirms the above findings. IMPRESSION: CTA CHEST 1. 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 This is just proximal to the origin of the right brachiocephalic artery which supplies both carotid arteries. This lesion is at high risk for embolization which could cause additional large vessel occlusion in the cerebral vasculature. The intimal irregularity could represent the form fruste of an aortic dissection if the patient has significant hypertension although the abnormality is confined to the intima. There  is no evidence of thickening or hemorrhage within the aortic media. 2. Left main and 3 vessel  coronary artery disease. CTA ABD/PELVIS 1. No evidence of acute aortic dissection or aneurysm. 2. However, there has been significant interval progression of irregular/ulcerated hypoechoic plaque and likely wall adherent mural thrombus within the visceral, renal and infrarenal abdominal aorta. 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 3. 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 There is irregular patchy enhancement throughout the prostate gland more (more on the right than the left) with numerous multifocal sclerotic bone lesions in the pelvis, right femoral head, lumbar and thoracic spine and ribs. At least a few of the small sclerotic foci have subtly enlarged compared to prior imaging. Recommend correlation with serum PSA and referral to urology. These results were called by telephone at the time of interpretation on 11/03/2018 at 1:11 pm to Dr. Sherwood Gambler , who verbally acknowledged these results. Signed, Criselda Peaches, MD, [facility] Electronically Signed   By: Jacqulynn Cadet M.D.   On: 11/03/2018 13:59   Ct Head Code Stroke Wo Contrast  Result Date: 11/03/2018 CLINICAL DATA:  Code stroke.  Left arm numbness EXAM: CT HEAD WITHOUT CONTRAST TECHNIQUE: Contiguous axial images were obtained from the base of the skull through the vertex without intravenous contrast. COMPARISON:  06/30/2015 brain MRI FINDINGS: Brain: No evidence of acute infarction, hemorrhage, hydrocephalus, extra-axial collection or mass lesion/mass effect. Vascular: No hyperdense vessel or unexpected calcification. Skull: Normal. Negative for fracture or focal lesion. Sinuses/Orbits: No acute finding. Other: These results were called by telephone at the time of interpretation on 11/03/2018 at 11:39 am to Dr. Sherwood Gambler  , who verbally acknowledged these results. ASPECTS [facility] Stroke Program Early CT Score) - Ganglionic level infarction (caudate, lentiform nuclei, internal capsule, insula, M1-M3 cortex): 7 - Supraganglionic infarction (M4-M6 cortex): 3 Total score (0-10 with 10 being normal): 10 IMPRESSION: Negative head CT.ASPECTS is 10. Electronically Signed   By: Monte Fantasia M.D.   On: 11/03/2018 11:41    Pending Labs Unresulted Labs (From admission, onward)    Start     Ordered   11/05/18 0500  Heparin level (unfractionated)  Daily,   R     11/03/18 2023   11/04/18 1300  Heparin level (unfractionated)  Once-Timed,   STAT     11/04/18 0454   11/04/18 1049  Culture, blood (Routine X 2) w Reflex to ID Panel  BLOOD CULTURE X 2,   R (with STAT occurrences)     11/04/18 1048   11/04/18 0500  CBC  Daily,   R     11/03/18 2023   11/04/18 0022  HIV antibody (Routine Testing)  Once,   STAT     11/04/18 0022          Vitals/Pain Today's Vitals   11/04/18 1030 11/04/18 1045 11/04/18 1115 11/04/18 1130  BP: (!) 141/98 129/89 (!) 151/92 (!) 146/89  Pulse: 63 62 71 86  Resp: 20 (!) 22 (!) 22 (!) 30  Temp:      TempSrc:      SpO2: 98% 97% 97% 96%  Weight:      Height:      PainSc:        Isolation Precautions No active isolations  Medications Medications  atorvastatin (LIPITOR) tablet 80 mg (80 mg Oral  Not Given 11/03/18 1158)  0.9 %  sodium chloride infusion ( Intravenous Rate/Dose Verify 11/04/18 0842)  acetaminophen (TYLENOL) tablet 650 mg (has no administration in time range)    Or  acetaminophen (TYLENOL) solution 650 mg (has no administration in time range)    Or  acetaminophen (TYLENOL) suppository 650 mg (has no administration in time range)  senna-docusate (Senokot-S) tablet 1 tablet (has no administration in time range)  heparin ADULT infusion 100 units/mL (25000 units/237mL sodium chloride 0.45%) (950 Units/hr Intravenous Rate/Dose Verify 11/04/18 0842)  nicotine (NICODERM CQ -  dosed in mg/24 hours) patch 21 mg (21 mg Transdermal Patch Applied 11/04/18 1038)  aspirin chewable tablet 324 mg (324 mg Oral Given 11/03/18 1158)  iohexol (OMNIPAQUE) 350 MG/ML injection 100 mL (100 mLs Intravenous Contrast Given 11/03/18 1242)  iohexol (OMNIPAQUE) 350 MG/ML injection 100 mL (100 mLs Intravenous Contrast Given 11/03/18 1241)   stroke: mapping our early stages of recovery book (1 each Does not apply Given 11/04/18 0057)    Mobility walks Low fall risk   Focused Assessments Cardiac Assessment Handoff:  Cardiac Rhythm: Normal sinus rhythm Lab Results  Component Value Date   TROPONINI 18.33 ([place]) 03/08/2018   No results found for: DDIMER Does the Patient currently have chest pain? No     R Recommendations: See Admitting Provider Note  Report given to: 3W06  Additional Notes: Pt A/Ox4, Verbal-able to make needs known, denies any pain at this time.

## 2018-11-04 NOTE — Consult Note (Signed)
Cardiology Consultation:   Patient ID: Daniel Weiss; [id number]; 1960/12/15   Admit date: 11/03/2018 Date of Consult: 11/04/2018  Primary Care Provider: Patient, No Pcp Per Primary Cardiologist: Glenetta Hew, MD 06/25/2018 Primary Electrophysiologist:  None   Patient Profile:   Daniel Weiss is a 58 y.o. male with a hx of DES LAD 02/2018 w/ EF 25% and med rx for mod D1/RCA dz, EF 35-40% echo 02/2018, TIA, LBBB, S-CHF, Warthin's tumor R parotid, tob use, HTN, dyslipidemia, who is being seen today for the evaluation of elevated trop, ?LV thrombus at the request of Dr Jonelle Sidle.  History of Present Illness:   Daniel Weiss came to the ER w/ sx concerning for code stroke. 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  Daniel Weiss was in his usual state of health until yesterday morning.  He woke up in the middle of the night with chest pain, took sublingual nitroglycerin x1 and the chest pain resolved.  He went back to sleep.  He went to work and one was at work for about an hour when his left arm became numb and he had problems using it.  He was banging it on a railing, but could not feel it at all.  He did not have chest pain at that time.  The numbness and decreased use of his left arm has gradually improved and now he feels like it is almost back to normal.  He went to [place] which scanned him and diagnosed the thrombus.  He was taken to [facility] where MRI was positive for small right frontal parietal infarcts.  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 Roxy Manns saw.  He recommends anticoagulation, with cardiology to weigh in on continuing aspirin and Brilinta.  No surgeries indicated at this time.  He also recommends evaluating the patient for hypercoagulable state and prostate cancer.  Daniel Weiss has been doing well until his current illness.  He works as a Training and development officer at [employer], and is active with his  job.  He never gets chest pain or shortness of breath.  He does not wake with lower extremity edema.  He denies any new dyspnea on exertion.  Because of COVID, he has not been sleeping as well as usual and thinks that he sometimes wakes with shortness of breath but cannot remember the last time this happened.  He has been compliant with his medications.   Past Medical History:  Diagnosis Date   Abnormal chest CT 02/2018   a. Ectatic 3 cm Ao arch - rec f/u outpt imaging w/ CTA or MRA. Ectatic atheromatous abd Ao @ risk for aneurysm - rec f/u u/s in 5 yrs. Marked prostatic enlargement w/ scattered small sclerotic foci in the thoracic lower lumbar spine, sacrum, left 12th rib, pelvis, and right femur.  Recommend elective outpatient whole-body bone scintigraphy and PSA.   CAD (coronary artery disease)    a. 02/2018 ACS/PCI: LM nl, LAD 85p (3.5x15 Sierra DES), D1 45ost, RI 55ost, RCA nl, EF 25-35%.   Complete left bundle branch block (LBBB) 2016   Current every day smoker    HFrEF (heart failure with reduced ejection fraction) ([place])    Hypertension    Ischemic cardiomyopathy 02/2018   a. 02/2018 Echo: EF 35-40% (LV gram on cath was 25-30%), mod, diff HK. mid-apicalanteroseptal, ant, and apical sev HK. RVH.   TIA (transient ischemic attack) 2016   Warthin's tumor  Past Surgical History:  Procedure Laterality Date   CORONARY STENT INTERVENTION N/A 03/06/2018   Procedure: CORONARY STENT INTERVENTION;  Surgeon: [name], MD;  Location: [place] [facility];  Service: Cardiovascular: Proximal LAD 85% stenosis (and D1): DES PCI with Xience Sierra DES 3.5 mm x 15 mm - 3.9 mm   INTRAOPERATIVE TRANSTHORACIC ECHOCARDIOGRAM  03/06/2018   (Peri-MI) mild reduced EF 35-40%.  Diffuse hypokinesis (severe HK of the mid-apical anteroseptal, anterior and apical myocardium consistent with LAD infarct).  Unable to assess diastolic function.  Paradoxical septal motion likely related to his LBBB.   RV hypertrophy noted.  Trivial pericardial effusion noted.    LEFT HEART CATH AND CORONARY ANGIOGRAPHY N/A 03/06/2018   Procedure: LEFT HEART CATH AND CORONARY ANGIOGRAPHY;  Surgeon: [name], MD;  Location: [place] [facility];  Service: Cardiovascular: Proximal LAD 85% involving D1 45%.  Ostial RI 55%.  Severe LV dysfunction EF 25 to 30%.  1+ Daniel.  Only minimally elevated LVEDP.     Prior to Admission medications   Medication Sig Start Date End Date Taking? Authorizing Provider  acetaminophen (TYLENOL) 325 MG tablet Take 2 tablets (650 mg total) by mouth every 4 (four) hours as needed for mild pain (temperature >/= 99.5 F). 07/08/14  Yes Ahmed, Chesley Mires, MD  aspirin EC 81 MG EC tablet Take 1 tablet (81 mg total) by mouth daily. 07/08/14  Yes Ahmed, Chesley Mires, MD  atorvastatin (LIPITOR) 80 MG tablet Take 1 tablet (80 mg total) by mouth daily. 03/10/18 11/03/18 Yes Theora Gianotti, NP  carvedilol (COREG) 12.5 MG tablet Take 1 tablet (12.5 mg total) by mouth 2 (two) times daily with a meal. 06/25/18  Yes [name], MD  ticagrelor (BRILINTA) 90 MG TABS tablet Take 1 tablet (90 mg total) by mouth 2 (two) times daily. 03/10/18  Yes Theora Gianotti, NP  lisinopril (PRINIVIL,ZESTRIL) 10 MG tablet Take 1 tablet (10 mg total) by mouth daily. 03/26/18 06/25/18  [name], MD  nitroGLYCERIN (NITROSTAT) 0.4 MG SL tablet Place 1 tablet (0.4 mg total) under the tongue every 5 (five) minutes as needed for chest pain. 03/10/18   Theora Gianotti, NP    Inpatient Medications: Scheduled Meds:  atorvastatin  80 mg Oral Once   nicotine  21 mg Transdermal Daily   Continuous Infusions:  sodium chloride 75 mL/hr at 11/04/18 0842   heparin 950 Units/hr (11/04/18 0842)   PRN Meds: acetaminophen **OR** acetaminophen (TYLENOL) oral liquid 160 mg/5 mL **OR** acetaminophen, senna-docusate  Allergies:   No Known Allergies  Social History:   Social History   Socioeconomic  History   Marital status: Married    Spouse name: Not on file   Number of children: Not on file   Years of education: Not on file   Highest education level: Not on file  Occupational History   Not on file  Social Needs   Financial resource strain: Not on file   Food insecurity    Worry: Not on file    Inability: Not on file   Transportation needs    Medical: Not on file    Non-medical: Not on file  Tobacco Use   Smoking status: Current Every Day Smoker    Packs/day: 0.50    Types: Cigarettes   Smokeless tobacco: Former Systems developer    Quit date: 02/23/2018  Substance and Sexual Activity   Alcohol use: Yes    Alcohol/week: 2.0 standard drinks    Types: 2 Cans  of beer per week    Comment: 2 Beers daily.   Drug use: Yes    Frequency: 3.0 times per week    Types: Marijuana   Sexual activity: Not on file  Lifestyle   Physical activity    Days per week: Not on file    Minutes per session: Not on file   Stress: Not on file  Relationships   Social connections    Talks on phone: Not on file    Gets together: Not on file    Attends religious service: Not on file    Active member of club or organization: Not on file    Attends meetings of clubs or organizations: Not on file    Relationship status: Not on file   Intimate partner violence    Fear of current or ex partner: Not on file    Emotionally abused: Not on file    Physically abused: Not on file    Forced sexual activity: Not on file  Other Topics Concern   Not on file  Social History Narrative   Not on file    Family History:   Family History  Problem Relation Age of Onset   Stroke Mother    Hypertension Mother    Diabetes type II Mother    Leukemia Other    Breast cancer Other    CAD Neg Hx    Family Status:  Family Status  Relation Name Status   Mother  Deceased   Father  Deceased   Other  (Not Specified)   Neg Hx  (Not Specified)    ROS:  Please see the history of present  illness.  All other ROS reviewed and negative.     Physical Exam/Data:   Vitals:   11/04/18 0945 11/04/18 1000 11/04/18 1015 11/04/18 1030  BP: (!) 137/91 (!) 148/105 (!) 140/93 (!) 141/98  Pulse: 67 65 65 63  Resp: 20 18 (!) 29 20  Temp:      TempSrc:      SpO2: 96% 99% 97% 98%  Weight:      Height:       No intake or output data in the 24 hours ending 11/04/18 1050 Filed Weights   11/03/18 1121  Weight: 59.4 kg   Body mass index is 17.28 kg/m.  General:  Well nourished, well developed, in no acute distress HEENT: normal Lymph: no adenopathy Neck: no JVD Endocrine:  No thryomegaly Vascular: No carotid bruits; 4/4 extremity pulses 2+, without bruits  Cardiac:  normal S1, S2; RRR; no murmur  Lungs:  clear to auscultation bilaterally, no wheezing, rhonchi or rales  Abd: soft, nontender, no hepatomegaly  Ext: no edema Musculoskeletal:  No deformities, BUE and BLE strength normal and equal Skin: warm and dry  Neuro:  CNs 2-12 intact, no focal abnormalities noted Psych:  Normal affect   EKG:  The EKG was personally reviewed and demonstrates: Sinus rhythm, heart rate 74, left bundle branch block is old and essentially unchanged Telemetry:  Telemetry was personally reviewed and demonstrates: Sinus rhythm  Relevant CV Studies:  ECHO: 11/04/2018, ordered ECHO: 08/16/2018  1. The left ventricle has moderately reduced systolic function, with an ejection fraction of 35-40%. The cavity size was normal. Left ventricular diastolic Doppler parameters are consistent with impaired relaxation.  2. There is akinesis of the apical anterior, apical lateral, mid anteroseptal, apical septal and apical walls.  3. The right ventricle has normal systolic function. The cavity was normal. There is no  increase in right ventricular wall thickness. Right ventricular systolic pressure is mildly elevated with an estimated pressure of 40.7 mmHg.  4. The aortic valve is tricuspid. Mild sclerosis of the  aortic valve. Aortic valve regurgitation was not assessed by color flow Doppler.  5. The interatrial septum appears to be lipomatous.  CATH: 03/06/2018  There is moderate to severe left ventricular systolic dysfunction.  LV end diastolic pressure is mildly elevated.  The left ventricular ejection fraction is 25-35% by visual estimate.  There is trivial (1+) mitral regurgitation.  Prox LAD lesion is 85% stenosed with 45% stenosed side branch in Ost 1st Diag.  Post intervention, there is a 0% residual stenosis.  Post intervention, the side branch was reduced to 45% residual stenosis.  A drug-eluting stent was successfully placed using a STENT SIERRA 3.50 X 15 MM.  Ost Ramus lesion is 55% stenosed.   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  Moderate to severely reduced LVEF with mid anterior and inferior severe hypokinesis and apical inferior and anterior akinesis -consistent with a recent anterior MI.  Despite reduced EF, minimally elevated LV EDP.  RECOMMENDATIONS  Patient will be admitted to ICU for post PCI care.  Continue on Brevibloc drip and wean off as we start on oral beta-blocker (carvedilol 3.125 mg twice daily)  Hold home dose of amlodipine until we see his blood pressure, would likely consider ARB for afterload reduction.  Check 2D echocardiogram to better assess EF tomorrow -if less than 30% would consider LifeVest prior to discharge.  High-dose statin (increased home dose of 40 mg daily milligrams) Diagnostic Dominance: Right  Intervention     Laboratory Data:  Chemistry Recent Labs  Lab 11/03/18 1140  NA 134*  K 3.8  CL 100  CO2 24  GLUCOSE 107*  BUN 5*  CREATININE 0.77  CALCIUM 8.6*  GFRNONAA >60  GFRAA >60  ANIONGAP 10    Lab Results   Component Value Date   ALT 9 11/03/2018   AST 16 11/03/2018   ALKPHOS 84 11/03/2018   BILITOT 0.4 11/03/2018   Hematology Recent Labs  Lab 11/03/18 1140 11/04/18 0354  WBC 8.0 6.3  RBC 4.02* 3.78*  HGB 10.6* 10.0*  HCT 32.6* 30.9*  MCV 81.1 81.7  MCH 26.4 26.5  MCHC 32.5 32.4  RDW 16.8* 16.7*  PLT 467* 488*   Cardiac Enzymes High Sensitivity Troponin:   Recent Labs  Lab 11/03/18 1140 11/03/18 1325 11/03/18 1535 11/03/18 1857  TROPONINIHS 53* 46* 73* 67*      TSH:  Lab Results  Component Value Date   TSH 0.621 07/07/2014   Lipids: Lab Results  Component Value Date   CHOL 118 11/04/2018   HDL 37 (L) 11/04/2018   LDLCALC 67 11/04/2018   TRIG 70 11/04/2018   CHOLHDL 3.2 11/04/2018   HgbA1c: Lab Results  Component Value Date   HGBA1C 5.3 11/04/2018   Magnesium: No results found for: MG   Radiology/Studies:  Ct Angio Head W Or Weiss Contrast  Result Date: 11/03/2018 CLINICAL DATA:  Acute onset of chest pain today. Numbness of the left arm and hand. EXAM: CT ANGIOGRAPHY HEAD AND NECK TECHNIQUE: Multidetector CT imaging of the head and neck was performed using the standard protocol during bolus administration of intravenous contrast. Multiplanar CT image  reconstructions and MIPs were obtained to evaluate the vascular anatomy. Carotid stenosis measurements (when applicable) are obtained utilizing NASCET criteria, using the distal internal carotid diameter as the denominator. CONTRAST:  171mL OMNIPAQUE IOHEXOL 350 MG/ML SOLN COMPARISON:  Head CT earlier same day FINDINGS: CTA NECK FINDINGS Aortic arch: Arch is negative. Branching pattern is normal without stenosis. Right carotid system: Common carotid artery widely patent to the bifurcation. No carotid bifurcation stenosis or irregularity. Minimal plaque in the ICA bulb. Left carotid system: Common carotid artery widely patent to the bifurcation. Mild atherosclerotic plaque at the carotid bifurcation and proximal  external carotid. No internal carotid stenosis or irregularity. Vertebral arteries: Both vertebral artery origins are patent. Mild atherosclerotic plaque on the right but no flow limiting stenosis. On the left, there is 50% stenosis due to focal calcified plaque. Both vertebral arteries are patent through the cervical region to the foramen magnum. Skeleton: Degenerative cervical spondylosis. Prominent osteophytes/disc herniations at C4-5 and C5-6 that could cause symptomatic stenosis of the canal and or foramina. Other neck: Large right parotid mass involving both the deep lobe and superficial lobe extending inferior from the superficial lobe. This has been seen on multiple previous examinations and is not changing, consistent with benign nature. Axial measurements are unchanged since 2017 at 32 x 43 mm. Upper chest: Negative Review of the MIP images confirms the above findings CTA HEAD FINDINGS Anterior circulation: Both internal carotid arteries are patent through the skull base and siphon regions. The anterior and middle cerebral vessels are patent without proximal stenosis, aneurysm or vascular malformation. No embolic occlusions are identified. Posterior circulation: Both vertebral arteries are widely patent through the foramen magnum to the basilar. The right is dominant. No basilar stenosis. Posterior circulation branch vessels are patent. Patent posterior communicating arteries on each side. Venous sinuses: Patent and normal. Anatomic variants: None significant. Review of the MIP images confirms the above findings IMPRESSION: No acute vascular finding to explain the clinical presentation. No sign of dissection or occlusion of the brachiocephalic or intracranial vessels. Mild atherosclerotic plaque as above but without flow limiting stenosis in the anterior circulation vessels. Posterior circulation vessels are patent. Atherosclerotic disease at both vertebral artery origins without measurable stenosis on the  right and with focal 50% stenosis on the left. 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 Advanced cervical degenerative disease with spinal stenosis and foraminal stenosis at C4-5 and C5-6 that could be significant. Electronically Signed   By: Nelson Chimes M.D.   On: 11/03/2018 13:40   Ct Angio Neck W And/or Weiss Contrast  Result Date: 11/03/2018 CLINICAL DATA:  Acute onset of chest pain today. Numbness of the left arm and hand. EXAM: CT ANGIOGRAPHY HEAD AND NECK TECHNIQUE: Multidetector CT imaging of the head and neck was performed using the standard protocol during bolus administration of intravenous contrast. Multiplanar CT image reconstructions and MIPs were obtained to evaluate the vascular anatomy. Carotid stenosis measurements (when applicable) are obtained utilizing NASCET criteria, using the distal internal carotid diameter as the denominator. CONTRAST:  176mL OMNIPAQUE IOHEXOL 350 MG/ML SOLN COMPARISON:  Head CT earlier same day FINDINGS: CTA NECK FINDINGS Aortic arch: Arch is negative. Branching pattern is normal without stenosis. Right carotid system: Common carotid artery widely patent to the bifurcation. No carotid bifurcation stenosis or irregularity. Minimal plaque in the ICA bulb. Left carotid system: Common carotid artery widely patent to the bifurcation. Mild atherosclerotic plaque at the carotid bifurcation  and proximal external carotid. No internal carotid stenosis or irregularity. Vertebral arteries: Both vertebral artery origins are patent. Mild atherosclerotic plaque on the right but no flow limiting stenosis. On the left, there is 50% stenosis due to focal calcified plaque. Both vertebral arteries are patent through the cervical region to the foramen magnum. Skeleton: Degenerative cervical spondylosis. Prominent osteophytes/disc herniations at C4-5 and C5-6 that could cause symptomatic stenosis of the canal  and or foramina. Other neck: Large right parotid mass involving both the deep lobe and superficial lobe extending inferior from the superficial lobe. This has been seen on multiple previous examinations and is not changing, consistent with benign nature. Axial measurements are unchanged since 2017 at 32 x 43 mm. Upper chest: Negative Review of the MIP images confirms the above findings CTA HEAD FINDINGS Anterior circulation: Both internal carotid arteries are patent through the skull base and siphon regions. The anterior and middle cerebral vessels are patent without proximal stenosis, aneurysm or vascular malformation. No embolic occlusions are identified. Posterior circulation: Both vertebral arteries are widely patent through the foramen magnum to the basilar. The right is dominant. No basilar stenosis. Posterior circulation branch vessels are patent. Patent posterior communicating arteries on each side. Venous sinuses: Patent and normal. Anatomic variants: None significant. Review of the MIP images confirms the above findings IMPRESSION: No acute vascular finding to explain the clinical presentation. No sign of dissection or occlusion of the brachiocephalic or intracranial vessels. Mild atherosclerotic plaque as above but without flow limiting stenosis in the anterior circulation vessels. Posterior circulation vessels are patent. Atherosclerotic disease at both vertebral artery origins without measurable stenosis on the right and with focal 50% stenosis on the left. 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 Advanced cervical degenerative disease with spinal stenosis and foraminal stenosis at C4-5 and C5-6 that could be significant. Electronically Signed   By: Nelson Chimes M.D.   On: 11/03/2018 13:40   Daniel Weiss Contrast  Result Date: 11/03/2018 CLINICAL DATA:  Left upper extremity weakness/numbness and chest pain. EXAM: MRI  HEAD WITHOUT CONTRAST TECHNIQUE: Multiplanar, multiecho pulse sequences of the brain and surrounding structures were obtained without intravenous contrast. COMPARISON:  Head CT 11/03/2018 and MRI 06/30/2015 FINDINGS: Brain: A few subcentimeter acute infarcts are present in the posterior right frontal and right parietal lobes involving cortex and subcortical white matter. Small foci of T2 hyperintensity in the cerebral white matter bilaterally have mildly progressed from 2017 and are nonspecific but compatible with mildly age advanced chronic small vessel ischemic disease. The ventricles and sulci are within normal limits for age. Vascular: Major intracranial vascular flow voids are preserved. Skull and upper cervical spine: Unremarkable bone marrow signal. Sinuses/Orbits: Unremarkable orbits. Minimal scattered mucosal thickening in the paranasal sinuses. Clear mastoid air cells. Other: Partially visualized large right parotid region mass as seen on multiple prior examinations. IMPRESSION: 1. Small acute right frontoparietal infarcts. 2. Mild chronic small vessel ischemic disease, mildly progressed from 2017. Electronically Signed   By: Logan Bores M.D.   On: 11/03/2018 18:37   Ct Coronary Morph W/cta Cor W/score W/ca W/cm &/or Weiss/cm  Result Date: 11/03/2018 EXAM: OVER-READ INTERPRETATION  CT CHEST The following report is an over-read performed by radiologist Dr. Rolm Baptise of [facility], [place] on 11/03/2018. This over-read does not include interpretation of cardiac or coronary anatomy or pathology. The coronary CTA interpretation by the cardiologist is attached. COMPARISON:  CTA chest earlier today  FINDINGS: Vascular: There is intimal irregularity with filling defect extending into the lumen of the distal ascending thoracic aorta near the proximal aortic arch. This is stable since study earlier today. No evidence of thoracic aortic aneurysm. Mediastinum/Nodes: No mediastinal, hilar, or axillary  adenopathy. Lungs/Pleura: Lungs clear.  No effusions. Upper Abdomen: Imaging into the upper abdomen shows no acute findings. Musculoskeletal: Scattered sclerotic densities seen in the thoracic spine as described on prior CT. IMPRESSION: Intimal irregularity with luminal filling defect in the proximal arch/distal ascending thoracic aorta, stable since prior study. Electronically Signed   By: Rolm Baptise M.D.   On: 11/03/2018 17:07   Dg Chest Portable 1 View  Result Date: 11/03/2018 CLINICAL DATA:  Pt states he woke up with chest pain this morning. He took 1 nitroglycerin which relieved the pain. One hour ( between 1030-1045) ago he reports numbness to L lower arm and hand., hx of TIA, HTN, no other complaints EXAM: PORTABLE CHEST 1 VIEW COMPARISON:  Chest radiographs dated 12/28 2019, 06/29/2015 FINDINGS: The heart size and mediastinal contours are within normal limits. The lungs are clear. No pneumothorax or pleural effusion. No acute abnormality in the visualized skeleton. IMPRESSION: Normal chest radiograph. Electronically Signed   By: Audie Pinto M.D.   On: 11/03/2018 11:44   Ct Angio Chest/abd/pel For Dissection W And/or Weiss Contrast  Result Date: 11/03/2018 CLINICAL DATA:  58 year old male with acute chest and back pain as well as stroke-like symptoms concerning for dissection. EXAM: CT ANGIOGRAPHY CHEST, ABDOMEN AND PELVIS TECHNIQUE: Multidetector CT imaging through the chest, abdomen and pelvis was performed using the standard protocol during bolus administration of intravenous contrast. Multiplanar reconstructed images and MIPs were obtained and reviewed to evaluate the vascular anatomy. CONTRAST:  134mL OMNIPAQUE IOHEXOL 350 MG/ML SOLN COMPARISON:  Prior CTA of the chest abdomen and pelvis 03/06/2018 FINDINGS: CTA CHEST FINDINGS Cardiovascular: 2 vessel aortic arch. The right brachiocephalic and left common carotid artery share a common origin. On the unenhanced images, there is no evidence of  intramural high attenuation to suggest intramural hematoma. Following administration of intravenous contrast, there is very subtle irregularity of the intima in the ascending thoracic aorta just proximal to the aortic arch. There is a linear irregular filling defect extending into the aortic lumen measuring approximately 1.4 cm in length by 0.2 mm in width. This almost certainly represents an area of linear thrombus adherent to the aortic wall. No evidence of aortic wall thickening, stranding in the mediastinal fat or hemorrhage. The remainder of the aortic arch and descending thoracic aorta are normal. The aortic root is normal. The heart is normal in size. Calcifications are present along the left main, left anterior descending, circumflex and right coronary artery. No pericardial effusion. Mediastinum/Nodes: Unremarkable CT appearance of the thyroid gland. No suspicious mediastinal or hilar adenopathy. No soft tissue mediastinal mass. The thoracic esophagus is unremarkable. Lungs/Pleura: Lungs are clear. No pleural effusion or pneumothorax. Musculoskeletal: No acute fracture or aggressive appearing lytic or blastic osseous lesion. Small low-attenuation lesion just deep to the skin surface on the right anterior chest wall just above the nipple line likely represents a sebaceous or other benign cutaneous cyst. Review of the MIP images confirms the above findings. CTA ABDOMEN AND PELVIS FINDINGS VASCULAR Aorta: The aorta is normal in caliber without evidence of aneurysm. Compared to the prior study from December of 2019, there has been significant interval progression of fibrofatty atherosclerotic plaque which is become irregular and ulcerated beginning at the aortic hiatus and  extending through the visceral, renal and infrarenal segments of the aorta. Just below the renal arteries, the lumen is narrowed by approximately 70%. Celiac: Patent without evidence of aneurysm, dissection, vasculitis or significant  stenosis. SMA: Patent without evidence of aneurysm, dissection, vasculitis or significant stenosis. Renals: Both renal arteries are patent without evidence of aneurysm, dissection, vasculitis, fibromuscular dysplasia or significant stenosis. IMA: Patent without evidence of aneurysm, dissection, vasculitis or significant stenosis. Inflow: Heterogeneous calcified and noncalcified atherosclerotic plaque without significant stenosis in either common iliac artery. No evidence of dissection. The left internal iliac artery is occluded at the origin but ultimately reconstitutes. The external iliac arteries are patent. 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 Veins: No focal venous abnormality. Review of the MIP images confirms the above findings. NON-VASCULAR Hepatobiliary: Normal morphology. Stable circumscribed low-attenuation lesions which almost certainly represent benign cysts. Gallbladder is unremarkable. No intra or extrahepatic biliary ductal dilatation. Pancreas: Unremarkable. No pancreatic ductal dilatation or surrounding inflammatory changes. Spleen: Normal in size without focal abnormality. Adrenals/Urinary Tract: Normal adrenal glands. No evidence of hydronephrosis, nephrolithiasis or enhancing renal mass. Stomach/Bowel: Stomach is within normal limits. Appendix appears normal. No evidence of bowel wall thickening, distention, or inflammatory changes. Lymphatic: No suspicious lymphadenopathy. Reproductive: Asymmetric enhancement of the right posterolateral prostate gland with patchy enhancement elsewhere. Findings are significantly more conspicuous compared to prior imaging. Other: No abdominal wall hernia or abnormality. No abdominopelvic ascites. Musculoskeletal: Multifocal sclerotic foci present throughout the skeleton. An index lesion in the left hemi sacrum measures slightly larger compared to the prior study at 0.9 cm compared to 0.8 cm previously. A second  index lesion in the right transverse process of T1 measures 1.0 cm compared to 0.8 cm previously. Numerous additional lesions are present scattered throughout the thoracic and lumbar spine. These lesions are significantly smaller and not dramatically changed. Lesions are also identified in both iliac bones, bilateral ischial tuberosities, ribs and the right femoral head. The smaller of the 2 femoral head lesions also appears slightly enlarged. Review of the MIP images confirms the above findings. IMPRESSION: CTA CHEST 1. 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 This is just proximal to the origin of the right brachiocephalic artery which supplies both carotid arteries. This lesion is at high risk for embolization which could cause additional large vessel occlusion in the cerebral vasculature. The intimal irregularity could represent the form fruste of an aortic dissection if the patient has significant hypertension although the abnormality is confined to the intima. There is no evidence of thickening or hemorrhage within the aortic media. 2. Left main and 3 vessel coronary artery disease. CTA ABD/PELVIS 1. No evidence of acute aortic dissection or aneurysm. 2. However, there has been significant interval progression of irregular/ulcerated hypoechoic plaque and likely wall adherent mural thrombus within the visceral, renal and infrarenal abdominal aorta. 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 3. 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 There is irregular patchy enhancement throughout the prostate gland more (more on the right than the left) with numerous multifocal sclerotic bone lesions in the pelvis, right femoral head, lumbar and  thoracic spine and ribs. At least a few of the small sclerotic foci have subtly enlarged  compared to prior imaging. Recommend correlation with serum PSA and referral to urology. These results were called by telephone at the time of interpretation on 11/03/2018 at 1:11 pm to Dr. Sherwood Gambler , who verbally acknowledged these results. Signed, Criselda Peaches, MD, [facility] Radiology Electronically Signed   By: Jacqulynn Cadet M.D.   On: 11/03/2018 13:59   Ct Head Code Stroke Weiss Contrast  Result Date: 11/03/2018 CLINICAL DATA:  Code stroke.  Left arm numbness EXAM: CT HEAD WITHOUT CONTRAST TECHNIQUE: Contiguous axial images were obtained from the base of the skull through the vertex without intravenous contrast. COMPARISON:  06/30/2015 brain MRI FINDINGS: Brain: No evidence of acute infarction, hemorrhage, hydrocephalus, extra-axial collection or mass lesion/mass effect. Vascular: No hyperdense vessel or unexpected calcification. Skull: Normal. Negative for fracture or focal lesion. Sinuses/Orbits: No acute finding. Other: These results were called by telephone at the time of interpretation on 11/03/2018 at 11:39 am to Dr. Sherwood Gambler , who verbally acknowledged these results. ASPECTS [facility] Stroke Program Early CT Score) - Ganglionic level infarction (caudate, lentiform nuclei, internal capsule, insula, M1-M3 cortex): 7 - Supraganglionic infarction (M4-M6 cortex): 3 Total score (0-10 with 10 being normal): 10 IMPRESSION: Negative head CT.ASPECTS is 10. Electronically Signed   By: Monte Fantasia M.D.   On: 11/03/2018 11:41    Assessment and Plan:   1.  Chest pain: - He has no history of exertional symptoms since his stent. - The chest pain that woke him was sharp, unlike his previous angina. -His troponin elevation is mild and not consistent with ACS - Agree with echocardiogram, MD advise if any further testing is needed  2.  CAD: - He  had a drug-eluting stent to the LAD 8 months ago - Ordinarily, he would be on aspirin and Brilinta for a year -However, because of the need for anticoagulation, MD advise on stopping the aspirin and changing the Brilinta to Plavix  3.  Ascending aorta thrombus - On CT, he also has thrombus in the proximal arch/distal ascending aorta as well as mural thrombus in the visceral, renal and infrarenal aorta.  "Suspect embolic phenomena due to flush occlusion of the left internal iliac and left profunda arteries."  Multiple sclerotic bone lesions and abnormal prostate enhancement are concerning for prostate cancer with metastatic disease. -Evaluation per IM  Otherwise, per IM Principal Problem:   Acute cerebrovascular accident (CVA) ([place]) Active Problems:   Paresthesia   Essential hypertension   Hyponatremia   Hypokalemia   Tobacco abuse   Coronary artery disease involving native coronary artery of native heart with angina pectoris ([name])   Hyperlipidemia LDL goal <70   Enlarged prostate   Ischemic cardiomyopathy     For questions or updates, please contact [facility] Please consult [email or web address] for contact info under Cardiology/STEMI.   SignedRosaria Ferries, PA-C  11/04/2018 10:50 AM

## 2018-11-04 NOTE — Evaluation (Signed)
Occupational Therapy Evaluation Patient Details Name: Daniel Weiss MRN: [id number] DOB: 1960-09-12 Today's Date: 11/04/2018    History of Present Illness Pt is a  57 yo male s/p MRI: Small acute right frontoparietal infarcts. CT angio revealing dissection vs clot and possible metastic dx of prostate with multiple sclerotic bone lesions. PMHX: HLD, HLD, s/p cardiac angio.   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 Pt currently, performing mobility with minguardA overall for stability due to LOB episode. Pt currently, performing ADL tasks in standing with supervisionA to minguardA for balance. Pt following commands and with no physical focal deficits in LUE anymore. Pt  Drifting to L in hallway and required cues to fix it. Pt would benefit from continued OT skilled services for ADL and higher level cognition. OT following acutely.      Follow Up Recommendations  No OT follow up    Equipment Recommendations  None recommended by OT    Recommendations for Other Services       Precautions / Restrictions Precautions Precautions: Fall Restrictions Weight Bearing Restrictions: No      Mobility Bed Mobility Overal bed mobility: Modified Independent                Transfers Overall transfer level: Needs assistance Equipment used: None Transfers: Sit to/from Stand Sit to Stand: Supervision;Min guard         General transfer comment: minguardA for initial steadiness for balance    Balance Overall balance assessment: Needs assistance Sitting-balance support: No upper extremity supported;Feet supported Sitting balance-Leahy Scale: Fair     Standing balance support: No upper extremity supported;During functional activity Standing balance-Leahy Scale: Fair Standing balance comment: 1 LOB episode.                           ADL either performed or assessed with clinical judgement   ADL Overall ADL's : At baseline                                        General ADL Comments: Pt appears to require supervision for standing ADL, but able to safely pick item from floor and rearrange chair position in room.     Vision Baseline Vision/History: No visual deficits Vision Assessment?: No apparent visual deficits     Perception Perception Comments: pt with WNLs for proprioception   Praxis      Pertinent Vitals/Pain Pain Assessment: No/denies pain     Hand Dominance Right   Extremity/Trunk Assessment Upper Extremity Assessment Upper Extremity Assessment: Overall WFL for tasks assessed   Lower Extremity Assessment Lower Extremity Assessment: Defer to PT evaluation   Cervical / Trunk Assessment Cervical / Trunk Assessment: Normal   Communication Communication Communication: No difficulties   Cognition Arousal/Alertness: Awake/alert Behavior During Therapy: WFL for tasks assessed/performed Overall Cognitive Status: Impaired/Different from baseline                                     General Comments       Exercises     Shoulder Instructions      Home Living Family/patient expects to be discharged to:: Private residence Living Arrangements: Alone Available Help at Discharge: Family;Friend(s);Available 24 hours/day Type of Home: House Home Access: Stairs to enter Entrance  Stairs-Number of Steps: 4 Entrance Stairs-Rails: Can reach both Home Layout: One level     Bathroom Shower/Tub: Occupational psychologist: Handicapped height     Home Equipment: None      Lives With: Alone    Prior Functioning/Environment Level of Independence: Independent        Comments: Cook        OT Problem List: Decreased strength;Decreased activity tolerance;Impaired balance (sitting and/or standing);Decreased safety awareness      OT Treatment/Interventions: Self-care/ADL training;Neuromuscular education;Therapeutic exercise;Energy conservation;Therapeutic  activities;Patient/family education;Balance training    OT Goals(Current goals can be found in the care plan section) Acute Rehab OT Goals Patient Stated Goal: to go home OT Goal Formulation: With patient Time For Goal Achievement: 11/18/18 Potential to Achieve Goals: Good ADL Goals Pt Will Perform Lower Body Dressing: with modified independence;sit to/from stand Pt Will Perform Toileting - Clothing Manipulation and hygiene: with modified independence;sit to/from stand Additional ADL Goal #1: pt will perform OOB ADL/IADL with modified independence with no cues for attending to tasks. Additional ADL Goal #2: Pt to perform higher level cognitive tasks with 100% accuracy with no verbal cueing to attend to task  OT Frequency: Min 2X/week   Barriers to D/C: Decreased caregiver support  lives alone       Co-evaluation              AM-PAC OT "6 Clicks" Daily Activity     Outcome Measure Help from another person eating meals?: None Help from another person taking care of personal grooming?: A Little Help from another person toileting, which includes using toliet, bedpan, or urinal?: A Little Help from another person bathing (including washing, rinsing, drying)?: A Little Help from another person to put on and taking off regular upper body clothing?: None Help from another person to put on and taking off regular lower body clothing?: A Little 6 Click Score: 20   End of Session Equipment Utilized During Treatment: Gait belt Nurse Communication: Mobility status  Activity Tolerance: Patient tolerated treatment well Patient left: in chair;with call bell/phone within reach;with chair alarm set  OT Visit Diagnosis: Unsteadiness on feet (R26.81);Muscle weakness (generalized) (M62.81)                Time: [id number] OT Time Calculation (min): 35 min Charges:  OT General Charges $OT Visit: 1 Visit OT Evaluation $OT Eval Moderate Complexity: 1 Mod  Darryl Nestle) Marsa Aris OTR/L Acute  Rehabilitation Services Pager: [phone] Office: [place] 11/04/2018, 4:58 PM

## 2018-11-04 NOTE — Progress Notes (Signed)
  Speech Language Pathology Treatment: Cognitive-Linquistic  Patient Details Name: Daniel Weiss MRN: [id number] DOB: [birth year] Today's Date: 11/04/2018 Time: [id number] SLP Time Calculation (min) (ACUTE ONLY): 20 min  Assessment / Plan / Recommendation Clinical Impression  Pt was seen for cognitive-linguistic treatment and was cooperative throughout the session. He demonstrated 60% accuracy with time management problems increasing to 100% with min-mod cues. He achieved 100% accuracy with problem solving related to safety and with a medication management (prescription) task. He completed an executive function calendar activity with 100% accuracy. He was able to recall concrete information from voicemail recordings with 60% accuracy increasing to 70% with min-mod cues. SLP will continue to follow pt.     HPI HPI: Pt is a 59 y.o. male with medical history significant of coronary artery disease status post cardiac cath with angioplasty in 2019, ischemic cardiomyopathy, hypertension, tobacco abuse, and hyperlipidemia who went to [place] complaining of the left arm tingling weakness as well as chest pain and was transferred to [facility] for full stroke work up. MRI of the brain showed small acute right frontoparietal infarcts.      SLP Plan  Continue with current plan of care  Patient needs continued Speech Lanaguage Pathology Services    Recommendations                   Follow up Recommendations: (TBD based on progress with acute SLP treatment. ) SLP Visit Diagnosis: Cognitive communication deficit (R41.841) Plan: Continue with current plan of care       Shunta Mclaurin I. Hardin Negus, [place], [place] Office number [phone] Pager [place] 11/04/2018, 5:26 PM

## 2018-11-04 NOTE — ED Notes (Signed)
Lunch Tray Ordered @ 1021. 

## 2018-11-04 NOTE — Progress Notes (Signed)
11/04/18 1753  PT Visit Information  Last PT Received On 11/04/18  Assistance Needed +1  PT/OT/SLP Co-Evaluation/Treatment Yes  Reason for Co-Treatment Complexity of the patient's impairments (multi-system involvement)  PT goals addressed during session Mobility/safety with mobility;Balance  History of Present Illness Pt is a  58 yo male Admitted secondary to LUE numbness.  MRI: Small acute right frontoparietal infarcts. CT angio revealing dissection vs clot and possible metastic dx of prostate with multiple sclerotic bone lesions. PMHX: HLD, HLD, s/p cardiac angio.  Precautions  Precautions Fall  Restrictions  Weight Bearing Restrictions No  Home Living  Family/patient expects to be discharged to: Private residence  Living Arrangements Alone  Available Help at Discharge Family;Friend(s);Available PRN/intermittently  Type of Home House  Home Access Stairs to enter  Entrance Stairs-Number of Steps 4  Entrance Stairs-Rails Can reach both  Home Layout One level  Bathroom Shower/Tub Walk-in shower  Bathroom Toilet Handicapped height  Home Equipment None   Lives With Alone  Prior Function  Level of Independence Independent  Comments Works as a Engineer, petroleum No difficulties  Pain Assessment  Pain Assessment No/denies pain  Cognition  Arousal/Alertness Awake/alert  Behavior During Therapy WFL for tasks assessed/performed  Overall Cognitive Status No family/caregiver present to determine baseline cognitive functioning  Upper Extremity Assessment  Upper Extremity Assessment Defer to OT evaluation  Lower Extremity Assessment  Lower Extremity Assessment Generalized weakness  Cervical / Trunk Assessment  Cervical / Trunk Assessment Normal  Bed Mobility  Overal bed mobility Modified Independent  Transfers  Overall transfer level Needs assistance  Equipment used None  Transfers Sit to/from Stand  Sit to [employer] guard  General transfer comment Min guard for  steadying assist.   Ambulation/Gait  Ambulation/Gait assistance Min guard;Min assist  Gait Distance (Feet) 300 Feet  Assistive device None  Gait Pattern/deviations Step-through pattern;Decreased stride length;Drifts right/left  General Gait Details Pt tended to drift to the L during functional mobility tasks. Had one small LOB to the L and required min A for steadying. Mild unsteadiness noted with dynamic gait tasks as well. Required min guard A for steadying assist.   Gait velocity Decreased  Modified Rankin (Stroke Patients Only)  Pre-Morbid Rankin Score 0  Modified Rankin 4  Balance  Overall balance assessment Needs assistance  Sitting-balance support No upper extremity supported;Feet supported  Sitting balance-Leahy Scale Fair  Standing balance support No upper extremity supported;During functional activity  Standing balance-Leahy Scale Fair  Standardized Balance Assessment  Standardized Balance Assessment  Dynamic Gait Index  Dynamic Gait Index  Level Surface 2  Change in Gait Speed 2  Gait with Horizontal Head Turns 2  Gait with Vertical Head Turns 2  Gait and Pivot Turn 2  Step Over Obstacle 2  PT - End of Session  Equipment Utilized During Treatment Gait belt  Activity Tolerance Patient tolerated treatment well  Patient left in chair;with call bell/phone within reach;with chair alarm set  Nurse Communication Mobility status  PT Assessment  PT Recommendation/Assessment Patient needs continued PT services  PT Visit Diagnosis Unsteadiness on feet (R26.81)  PT Problem List Decreased balance;Decreased mobility;Decreased cognition;Decreased knowledge of precautions  Barriers to Discharge Decreased caregiver support  PT Plan  PT Frequency (ACUTE ONLY) Min 4X/week  PT Treatment/Interventions (ACUTE ONLY) Gait training;Stair training;Functional mobility training;Therapeutic activities;Therapeutic exercise;Balance training;Patient/family education  AM-PAC PT "6 Clicks" Mobility  Outcome Measure (Version 2)  Help needed turning from your back to your side while in a flat bed  without using bedrails? 4  Help needed moving from lying on your back to sitting on the side of a flat bed without using bedrails? 4  Help needed moving to and from a bed to a chair (including a wheelchair)? 3  Help needed standing up from a chair using your arms (e.g., wheelchair or bedside chair)? 3  Help needed to walk in hospital room? 3  Help needed climbing 3-5 steps with a railing?  3  6 Click Score 20  Consider Recommendation of Discharge To: Home with no services  PT Recommendation  Follow Up Recommendations No PT follow up  PT equipment None recommended by PT  Individuals Consulted  Consulted and Agree with Results and Recommendations Patient  Acute Rehab PT Goals  Patient Stated Goal to go home   PT Goal Formulation With patient  Time For Goal Achievement 11/18/18  Potential to Achieve Goals Good  PT Time Calculation  PT Start Time (ACUTE ONLY) 1550  PT Stop Time (ACUTE ONLY) 1607  PT Time Calculation (min) (ACUTE ONLY) 17 min  PT General Charges  $$ ACUTE PT VISIT 1 Visit  PT Evaluation  $PT Eval Low Complexity 1 Low  Written Expression  Dominant Hand Right   Pt admitted secondary to problem above with deficits above. Pt with 1 LOB during gait requiring min A for steadying. Also tended to drift to the L during gait in the hallway. Performed dynamic gait tasks and required min guard A secondary to mild unsteadiness. Feel pt will progress well and will not require follow up PT services. Will continue to follow acutely to maximize functional mobility independence and safety.   Leighton Ruff, PT, DPT  Acute Rehabilitation Services  Pager: [phone] Office: [phone]

## 2018-11-04 NOTE — Consult Note (Signed)
Urology Consult  Referring physician: Dr. Tawanna Solo Reason for referral: metastatic prostate cancer  Chief Complaint: weak urinary stream, back pain  History of Present Illness: Daniel Weiss is a 58yo with a history of hypertension admitted after CVA. He underwent CT angio chest/abd/pelvis which showed multiple sclerotic lesions concerning for metastatic disease. A PSA was obtained which was 502. He has mild to moderate lower back pain which has worsened over the past month. The pain is dull and nonraditing. He also has new urinary hesitancy, urinary frequency and weak stream. He has never been on BPH therapy. No hematuria or dysuria. No other associated symptoms. NO exacerbating/alleviaitng events.   Past Medical History:  Diagnosis Date  . Abnormal chest CT 02/2018   a. Ectatic 3 cm Ao arch - rec f/u outpt imaging w/ CTA or MRA. Ectatic atheromatous abd Ao @ risk for aneurysm - rec f/u u/s in 5 yrs. Marked prostatic enlargement w/ scattered small sclerotic foci in the thoracic lower lumbar spine, sacrum, left 12th rib, pelvis, and right femur.  Recommend elective outpatient whole-body bone scintigraphy and PSA.  Marland Kitchen CAD (coronary artery disease)    a. 02/2018 ACS/PCI: LM nl, LAD 85p (3.5x15 Sierra DES), D1 45ost, RI 55ost, RCA nl, EF 25-35%.  . Complete left bundle branch block (LBBB) 2016  . Current every day smoker   . HFrEF (heart failure with reduced ejection fraction) ([place])   . Hypertension   . Ischemic cardiomyopathy 02/2018   a. 02/2018 Echo: EF 35-40% (LV gram on cath was 25-30%), mod, diff HK. mid-apicalanteroseptal, ant, and apical sev HK. RVH.  Marland Kitchen Myocardial infarction ([place])   . Stroke ([place])   . TIA (transient ischemic attack) 2016  . Warthin's tumor    Past Surgical History:  Procedure Laterality Date  . CORONARY STENT INTERVENTION N/A 03/06/2018   Procedure: CORONARY STENT INTERVENTION;  Surgeon: [name], MD;  Location: [facility];  Service: Cardiovascular:  Proximal LAD 85% stenosis (and D1): DES PCI with Xience Sierra DES 3.5 mm x 15 mm - 3.9 mm  . INTRAOPERATIVE TRANSTHORACIC ECHOCARDIOGRAM  03/06/2018   (Peri-MI) mild reduced EF 35-40%.  Diffuse hypokinesis (severe HK of the mid-apical anteroseptal, anterior and apical myocardium consistent with LAD infarct).  Unable to assess diastolic function.  Paradoxical septal motion likely related to his LBBB.  RV hypertrophy noted.  Trivial pericardial effusion noted.   Marland Kitchen LEFT HEART CATH AND CORONARY ANGIOGRAPHY N/A 03/06/2018   Procedure: LEFT HEART CATH AND CORONARY ANGIOGRAPHY;  Surgeon: [name], MD;  Location: [facility];  Service: Cardiovascular: Proximal LAD 85% involving D1 45%.  Ostial RI 55%.  Severe LV dysfunction EF 25 to 30%.  1+ Daniel.  Only minimally elevated LVEDP.    Medications: I have reviewed the patient's current medications. Allergies: No Known Allergies  Family History  Problem Relation Age of Onset  . Stroke Mother   . Hypertension Mother   . Diabetes type II Mother   . Leukemia Other   . Breast cancer Other   . CAD Neg Hx    Social History:  reports that he has been smoking cigarettes. He has been smoking about 0.50 packs per day. He quit smokeless tobacco use about 8 months ago. He reports current alcohol use of about 2.0 standard drinks of alcohol per week. He reports current drug use. Frequency: 3.00 times per week. Drug: Marijuana.  Review of Systems  Genitourinary: Positive for frequency and urgency.  Musculoskeletal: Positive for back  pain.  Neurological: Positive for dizziness and focal weakness.  All other systems reviewed and are negative.   Physical Exam:  Vital signs in last 24 hours: Temp:  [98.3 F (36.8 C)-98.9 F (37.2 C)] 98.9 F (37.2 C) (08/27 1949) Pulse Rate:  [58-92] 92 (08/27 1949) Resp:  [13-30] 18 (08/27 1949) BP: (106-161)/(67-108) 146/98 (08/27 1949) SpO2:  [96 %-100 %] 100 % (08/27 1949) Physical Exam  Constitutional: He  is oriented to person, place, and time. He appears well-developed and well-nourished.  HENT:  Head: Normocephalic and atraumatic.  Eyes: Pupils are equal, round, and reactive to light. EOM are normal.  Neck: Normal range of motion. No thyromegaly present.  Cardiovascular: Normal rate and regular rhythm.  Respiratory: Effort normal. No respiratory distress.  GI: Soft. He exhibits no distension. There is no abdominal tenderness. Hernia confirmed negative in the right inguinal area and confirmed negative in the left inguinal area.  Genitourinary:    Testes, penis and rectum normal.  Rectum:     No abnormal anal tone.  Prostate is enlarged.    Genitourinary Comments: Prostate gland firm, nodular, 60cc   Musculoskeletal: Normal range of motion.        General: No edema.  Lymphadenopathy:       Right: No inguinal adenopathy present.       Left: No inguinal adenopathy present.  Neurological: He is alert and oriented to person, place, and time.  Skin: Skin is warm and dry.  Psychiatric: He has a normal mood and affect. His behavior is normal. Judgment and thought content normal.    Laboratory Data:  Results for orders placed or performed during the hospital encounter of 11/03/18 (from the past 72 hour(s))  CBG monitoring, ED     Status: Abnormal   Collection Time: 11/03/18 11:23 AM  Result Value Ref Range   Glucose-Capillary 114 (H) 70 - 99 mg/dL   Comment 1 Notify RN    Comment 2 Document in Chart   Ethanol     Status: None   Collection Time: 11/03/18 11:40 AM  Result Value Ref Range   Alcohol, Ethyl (B)        <10 mg/dL    Comment: LOWEST DETECTABLE LIMIT FOR SERUM ALCOHOL IS 10 mg/dL FOR MEDICAL PURPOSES ONLY (NOTE) Lowest detectable limit for serum alcohol is 10 mg/dL. For medical purposes only. Performed at [facility], [address] [place], [place] [address]   Protime-INR     Status: None   Collection Time: 11/03/18 11:40 AM  Result Value Ref Range    Prothrombin Time 13.0 11.4 - 15.2 seconds   INR 1.0 0.8 - 1.2    Comment: (NOTE) INR goal varies based on device and disease states. Performed at [facility], [address] [place], [place] [address]   APTT     Status: None   Collection Time: 11/03/18 11:40 AM  Result Value Ref Range   aPTT 27 24 - 36 seconds    Comment: Performed at [facility], [address] [place], [place] [address]  CBC     Status: Abnormal   Collection Time: 11/03/18 11:40 AM  Result Value Ref Range   WBC 8.0 4.0 - 10.5 K/uL   RBC 4.02 (L) 4.22 - 5.81 MIL/uL   Hemoglobin 10.6 (L) 13.0 - 17.0 g/dL   HCT 32.6 (L) 39.0 - 52.0 %   MCV 81.1 80.0 - 100.0 fL   MCH 26.4 26.0 -  34.0 pg   MCHC 32.5 30.0 - 36.0 g/dL   RDW 16.8 (H) 11.5 - 15.5 %   Platelets 467 (H) 150 - 400 K/uL   nRBC 0.0 0.0 - 0.2 %    Comment: Performed at [facility], [address] [place], [place] [address]  Differential     Status: Abnormal   Collection Time: 11/03/18 11:40 AM  Result Value Ref Range   Neutrophils Relative % 58 %   Neutro Abs 4.7 1.7 - 7.7 K/uL   Lymphocytes Relative 26 %   Lymphs Abs 2.1 0.7 - 4.0 K/uL   Monocytes Relative 15 %   Monocytes Absolute 1.2 (H) 0.1 - 1.0 K/uL   Eosinophils Relative 0 %   Eosinophils Absolute 0.0 0.0 - 0.5 K/uL   Basophils Relative 1 %   Basophils Absolute 0.0 0.0 - 0.1 K/uL   Immature Granulocytes 0 %   Abs Immature Granulocytes 0.03 0.00 - 0.07 K/uL    Comment: Performed at [facility], [address] [place], [place] [address]  Comprehensive metabolic panel     Status: Abnormal   Collection Time: 11/03/18 11:40 AM  Result Value Ref Range   Sodium 134 (L) 135 - 145 mmol/L   Potassium 3.8 3.5 - 5.1 mmol/L   Chloride 100 98 - 111 mmol/L   CO2 24 22 - 32 mmol/L   Glucose, Bld 107 (H) 70 - 99 mg/dL   BUN 5 (L) 6 - 20 mg/dL   Creatinine, Ser 0.77 0.61 - 1.24 mg/dL   Calcium 8.6 (L) 8.9 - 10.3 mg/dL   Total Protein 6.6 6.5 - 8.1 g/dL    Albumin 2.9 (L) 3.5 - 5.0 g/dL   AST 16 15 - 41 U/L   ALT 9 0 - 44 U/L   Alkaline Phosphatase 84 38 - 126 U/L   Total Bilirubin 0.4 0.3 - 1.2 mg/dL   GFR calc non Af Amer >60 >60 mL/min   GFR calc Af Amer >60 >60 mL/min   Anion gap 10 5 - 15    Comment: Performed at [facility], [address] [place], [place] [address]  Troponin I (High Sensitivity)     Status: Abnormal   Collection Time: 11/03/18 11:40 AM  Result Value Ref Range   Troponin I (High Sensitivity) 53 (H) <18 ng/L    Comment: (NOTE) Elevated high sensitivity troponin I (hsTnI) values and significant  changes across serial measurements may suggest ACS but many other  chronic and acute conditions are known to elevate hsTnI results.  Refer to the "Links" section for chest pain algorithms and additional  guidance. Performed at [facility], [address] [place], [place] [address]   PSA     Status: Abnormal   Collection Time: 11/03/18 11:40 AM  Result Value Ref Range   Prostatic Specific Antigen 502.00 (H) 0.00 - 4.00 ng/mL    Comment: (NOTE) While PSA levels of <=4.0 ng/ml are reported as reference range, some men with levels below 4.0 ng/ml can have prostate cancer and many men with PSA above 4.0 ng/ml do not have prostate cancer.  Other tests such as free PSA, age specific reference ranges, PSA velocity and PSA doubling time may be helpful especially in men less than 52 years old. Performed at [facility], [place] [address], [place], [place] [address]   SARS CORONAVIRUS 2 (TAT 6-12 HRS) Nasal Swab Aptima Multi Swab  Status: None   Collection Time: 11/03/18 11:57 AM   Specimen: Aptima Multi Swab; Nasal Swab  Result Value Ref Range   SARS Coronavirus 2 NEGATIVE NEGATIVE    Comment: (NOTE) SARS-CoV-2 target nucleic acids are NOT DETECTED. The SARS-CoV-2 RNA is generally detectable in upper and lower respiratory specimens during the acute phase of infection. Negative results do  not preclude SARS-CoV-2 infection, do not rule out co-infections with other pathogens, and should not be used as the sole basis for treatment or other patient management decisions. Negative results must be combined with clinical observations, patient history, and epidemiological information. The expected result is Negative. Fact Sheet for Patients: SugarRoll.be Fact Sheet for Healthcare Providers: https://www.woods-mathews.com/ This test is not yet approved or cleared by the [place] FDA and  has been authorized for detection and/or diagnosis of SARS-CoV-2 by FDA under an Emergency Use Authorization (EUA). This EUA will remain  in effect (meaning this test can be used) for the duration of the COVID-19 declaration under Section 56 4(b)(1) of the Act, 21 U.S.C. section 360bbb-3(b)(1), unless the authorization is terminated or revoked sooner. Performed at [facility], [place] [address], [place], [place] [address]   Urine rapid drug screen (hosp performed)     Status: Abnormal   Collection Time: 11/03/18  1:04 PM  Result Value Ref Range   Opiates NONE DETECTED NONE DETECTED   Cocaine NONE DETECTED NONE DETECTED   Benzodiazepines NONE DETECTED NONE DETECTED   Amphetamines NONE DETECTED NONE DETECTED   Tetrahydrocannabinol POSITIVE (A) NONE DETECTED   Barbiturates NONE DETECTED NONE DETECTED    Comment: (NOTE) DRUG SCREEN FOR MEDICAL PURPOSES ONLY.  IF CONFIRMATION IS NEEDED FOR ANY PURPOSE, NOTIFY LAB WITHIN 5 DAYS. LOWEST DETECTABLE LIMITS FOR URINE DRUG SCREEN Drug Class                     Cutoff (ng/mL) Amphetamine and metabolites    1000 Barbiturate and metabolites    200 Benzodiazepine                 [id number] Tricyclics and metabolites     300 Opiates and metabolites        300 Cocaine and metabolites        300 THC                            50 Performed at [facility], [address] [place], [place]  [address]   Urinalysis, Routine w reflex microscopic     Status: Abnormal   Collection Time: 11/03/18  1:04 PM  Result Value Ref Range   Color, Urine YELLOW YELLOW   APPearance CLEAR CLEAR   Specific Gravity, Urine <1.005 (L) 1.005 - 1.030   pH 6.5 5.0 - 8.0   Glucose, UA NEGATIVE NEGATIVE mg/dL   Hgb urine dipstick TRACE (A) NEGATIVE   Bilirubin Urine NEGATIVE NEGATIVE   Ketones, ur NEGATIVE NEGATIVE mg/dL   Protein, ur NEGATIVE NEGATIVE mg/dL   Nitrite NEGATIVE NEGATIVE   Leukocytes,Ua NEGATIVE NEGATIVE    Comment: Performed at [facility], [address] [place], [place] [address]  Urinalysis, Microscopic (reflex)     Status: Abnormal   Collection Time: 11/03/18  1:04 PM  Result Value Ref Range   RBC / HPF 0-5 0 - 5 RBC/hpf   WBC, UA 0-5 0 - 5 WBC/hpf   Bacteria, UA RARE (A) NONE  SEEN   Squamous Epithelial / LPF 0-5 0 - 5   Sperm, UA PRESENT     Comment: Performed at [facility], [address] [place], [place] [address]  Troponin I (High Sensitivity)     Status: Abnormal   Collection Time: 11/03/18  1:25 PM  Result Value Ref Range   Troponin I (High Sensitivity) 46 (H) <18 ng/L    Comment: (NOTE) Elevated high sensitivity troponin I (hsTnI) values and significant  changes across serial measurements may suggest ACS but many other  chronic and acute conditions are known to elevate hsTnI results.  Refer to the "Links" section for chest pain algorithms and additional  guidance. Performed at [facility], [address] [place], [place] [address]   Troponin I (High Sensitivity)     Status: Abnormal   Collection Time: 11/03/18  3:35 PM  Result Value Ref Range   Troponin I (High Sensitivity) 73 (H) <18 ng/L    Comment: (NOTE) Elevated high sensitivity troponin I (hsTnI) values and significant  changes across serial measurements may suggest ACS but many other  chronic and acute conditions are known to elevate hsTnI results.  Refer to the  "Links" section for chest pain algorithms and additional  guidance. Performed at [facility], [place] [address] [place], [place] [address]   Troponin I (High Sensitivity)     Status: Abnormal   Collection Time: 11/03/18  6:57 PM  Result Value Ref Range   Troponin I (High Sensitivity) 67 (H) <18 ng/L    Comment: (NOTE) Elevated high sensitivity troponin I (hsTnI) values and significant  changes across serial measurements may suggest ACS but many other  chronic and acute conditions are known to elevate hsTnI results.  Refer to the "Links" section for chest pain algorithms and additional  guidance. Performed at [facility], [place] [address] [place], [place] [address]   HIV antibody (Routine Testing)     Status: None   Collection Time: 11/04/18 12:22 AM  Result Value Ref Range   HIV Screen 4th Generation wRfx Non Reactive Non Reactive    Comment: (NOTE) Performed At: [facility] [place], [place] [id number] Rush Farmer MD [id number]   Heparin level (unfractionated)     Status: Abnormal   Collection Time: 11/04/18  3:54 AM  Result Value Ref Range   Heparin Unfractionated <0.10 (L) 0.30 - 0.70 IU/mL    Comment: REPEATED TO VERIFY (NOTE) If heparin results are below expected values, and patient dosage has  been confirmed, suggest follow up testing of antithrombin III levels. Performed at [facility], [place] [address], [place], [place] [address]   CBC     Status: Abnormal   Collection Time: 11/04/18  3:54 AM  Result Value Ref Range   WBC 6.3 4.0 - 10.5 K/uL   RBC 3.78 (L) 4.22 - 5.81 MIL/uL   Hemoglobin 10.0 (L) 13.0 - 17.0 g/dL   HCT 30.9 (L) 39.0 - 52.0 %   MCV 81.7 80.0 - 100.0 fL   MCH 26.5 26.0 - 34.0 pg   MCHC 32.4 30.0 - 36.0 g/dL   RDW 16.7 (H) 11.5 - 15.5 %   Platelets 488 (H) 150 - 400 K/uL   nRBC 0.0 0.0 - 0.2 %    Comment: Performed at [facility] [address] [place], [place] [address]  Hemoglobin A1c      Status: None   Collection Time:  11/04/18  3:54 AM  Result Value Ref Range   Hgb A1c MFr Bld 5.3 4.8 - 5.6 %    Comment: (NOTE) Pre diabetes:          5.7%-6.4% Diabetes:              >6.4% Glycemic control for   <7.0% adults with diabetes    Mean Plasma Glucose 105.41 mg/dL    Comment: Performed at [facility] [address] [place], [place] [address]  Lipid panel     Status: Abnormal   Collection Time: 11/04/18  3:54 AM  Result Value Ref Range   Cholesterol 118 0 - 200 mg/dL   Triglycerides 70 <150 mg/dL   HDL 37 (L) >40 mg/dL   Total CHOL/HDL Ratio 3.2 RATIO   VLDL 14 0 - 40 mg/dL   LDL Cholesterol 67 0 - 99 mg/dL    Comment:        Total Cholesterol/HDL:CHD Risk Coronary Heart Disease Risk Table                     Men   Women  1/2 Average Risk   3.4   3.3  Average Risk       5.0   4.4  2 X Average Risk   9.6   7.1  3 X Average Risk  23.4   11.0        Use the calculated Patient Ratio above and the CHD Risk Table to determine the patient's CHD Risk.        ATP III CLASSIFICATION (LDL):  <100     mg/dL   Optimal  100-129  mg/dL   Near or Above                    Optimal  130-159  mg/dL   Borderline  160-189  mg/dL   High  >190     mg/dL   Very High Performed at [facility] [address], [place], [place] [address]   Heparin level (unfractionated)     Status: Abnormal   Collection Time: 11/04/18  1:32 PM  Result Value Ref Range   Heparin Unfractionated <0.10 (L) 0.30 - 0.70 IU/mL    Comment: (NOTE) If heparin results are below expected values, and patient dosage has  been confirmed, suggest follow up testing of antithrombin III levels. Performed at [facility], [place] [address] [place], [place] [address]   Heparin level (unfractionated)     Status: Abnormal   Collection Time: 11/04/18  8:54 PM  Result Value Ref Range   Heparin Unfractionated 0.14 (L) 0.30 - 0.70 IU/mL    Comment: (NOTE) If heparin results are below expected values,  and patient dosage has  been confirmed, suggest follow up testing of antithrombin III levels. Performed at [facility], [place] [address] [place],  [address]    Recent Results (from the past 240 hour(s))  SARS CORONAVIRUS 2 (TAT 6-12 HRS) Nasal Swab Aptima Multi Swab     Status: None   Collection Time: 11/03/18 11:57 AM   Specimen: Aptima Multi Swab; Nasal Swab  Result Value Ref Range Status   SARS Coronavirus 2 NEGATIVE NEGATIVE Final    Comment: (NOTE) SARS-CoV-2 target nucleic acids are NOT DETECTED. The SARS-CoV-2 RNA is generally detectable in upper and lower respiratory specimens during the acute phase of infection. Negative results do not preclude SARS-CoV-2 infection, do not rule out co-infections  with other pathogens, and should not be used as the sole basis for treatment or other patient management decisions. Negative results must be combined with clinical observations, patient history, and epidemiological information. The expected result is Negative. Fact Sheet for Patients: SugarRoll.be Fact Sheet for Healthcare Providers: https://www.woods-mathews.com/ This test is not yet approved or cleared by the [place] FDA and  has been authorized for detection and/or diagnosis of SARS-CoV-2 by FDA under an Emergency Use Authorization (EUA). This EUA will remain  in effect (meaning this test can be used) for the duration of the COVID-19 declaration under Section 56 4(b)(1) of the Act, 21 U.S.C. section 360bbb-3(b)(1), unless the authorization is terminated or revoked sooner. Performed at [facility], [place] [address] [place], [place] [address]    Creatinine: Recent Labs    11/03/18 1140  CREATININE 0.77   Baseline Creatinine: 0.77  Impression/Assessment:  57yo with BPH with LUTS, weak urinary stream and likely metastatic prostate cancer  Plan:  1. BPH with LUTS, weak stream: I discussed the management  with the patient including alpha blocker therapy and the patient wishes to proceed with therapy. We will start flomax 0.4mg  daily. 2. Likely metastatic prostate cancer: I discussed the natural history of metastatic prostate cancer and the workup including prostate biopsy and bone scan imaging. The patient is not a candidate for prostate biopsy due to anticoagulation. We will obtain a bone scan and if positive we will proceed with therapy for metastatic prostate cancer.  Nicolette Bang 11/04/2018, 10:10 PM

## 2018-11-04 NOTE — Progress Notes (Signed)
ANTICOAGULATION CONSULT NOTE   Pharmacy Consult for Heparin Indication: thrombus and CVA  No Known Allergies  Patient Measurements: Height: 6\' 1"  (185.4 cm) Weight: 131 lb (59.4 kg) IBW/kg (Calculated) : 79.9 Heparin Dosing Weight: 59.4 kg  Vital Signs: BP: 138/85 (08/27 1200) Pulse Rate: 78 (08/27 1200)  Labs: Recent Labs    11/03/18 1140 11/03/18 1325 11/03/18 1535 11/03/18 1857 11/04/18 0354 11/04/18 1332  HGB 10.6*  --   --   --  10.0*  --   HCT 32.6*  --   --   --  30.9*  --   PLT 467*  --   --   --  488*  --   APTT 27  --   --   --   --   --   LABPROT 13.0  --   --   --   --   --   INR 1.0  --   --   --   --   --   HEPARINUNFRC  --   --   --   --  <0.10* <0.10*  CREATININE 0.77  --   --   --   --   --   TROPONINIHS 53* 46* 73* 67*  --   --     Estimated Creatinine Clearance: 84.6 mL/min (by C-G formula based on SCr of 0.77 mg/dL).   Medical History: Past Medical History:  Diagnosis Date  . Abnormal chest CT 02/2018   a. Ectatic 3 cm Ao arch - rec f/u outpt imaging w/ CTA or MRA. Ectatic atheromatous abd Ao @ risk for aneurysm - rec f/u u/s in 5 yrs. Marked prostatic enlargement w/ scattered small sclerotic foci in the thoracic lower lumbar spine, sacrum, left 12th rib, pelvis, and right femur.  Recommend elective outpatient whole-body bone scintigraphy and PSA.  Marland Kitchen CAD (coronary artery disease)    a. 02/2018 ACS/PCI: LM nl, LAD 85p (3.5x15 Sierra DES), D1 45ost, RI 55ost, RCA nl, EF 25-35%.  . Complete left bundle branch block (LBBB) 2016  . Current every day smoker   . HFrEF (heart failure with reduced ejection fraction) ([place])   . Hypertension   . Ischemic cardiomyopathy 02/2018   a. 02/2018 Echo: EF 35-40% (LV gram on cath was 25-30%), mod, diff HK. mid-apicalanteroseptal, ant, and apical sev HK. RVH.  Marland Kitchen Myocardial infarction ([place])   . Stroke ([place])   . TIA (transient ischemic attack) 2016  . Warthin's tumor     Medications:  Scheduled:  .  atorvastatin  80 mg Oral Once  . feeding supplement (ENSURE ENLIVE)  237 mL Oral BID BM  . nicotine  21 mg Transdermal Daily   Infusions:  . sodium chloride 75 mL/hr at 11/04/18 0842  . heparin 950 Units/hr (11/04/18 0842)   Assessment: 5 yoM [facility] with CVA for CTA work up aortic dissection vs. Clot. No TPA was given. No AC PTA. Kary Kos is currently held. Pharmacy consulted to dose heparin. No surgery planned.  MRI brain with small right cerebral infarcts. Per neurology, small size of acute strokes, low likelihood of hemorrhagic transformation - dose heparin drip without bolus stroke protocol.  Heparin level remains undetectable after rate increase. CBC stable. No issues with infusion or bleeding per RN.  Goal of Therapy:  Heparin level 0.3-0.5 units/ml Monitor platelets by anticoagulation protocol: Yes   Plan:  No heparin bolus Increase heparin to 1200 units/hr Heparin level in 6 hours Monitor daily heparin levels, CBC, and s/sx of  bleeding  Elicia Lamp, PharmD, BCPS Please check AMION for all [facility] contact numbers Clinical Pharmacist 11/04/2018 2:40 PM

## 2018-11-04 NOTE — Evaluation (Signed)
Speech Language Pathology Evaluation Patient Details Name: Daniel Weiss MRN: [id number] DOB: 1960/07/28 Today's Date: 11/04/2018 Time: [id number] SLP Time Calculation (min) (ACUTE ONLY): 28 min  Problem List:  Patient Active Problem List   Diagnosis Date Noted  . Acute cardioembolic stroke ([place])   . Acute cerebrovascular accident (CVA) ([place]) 11/03/2018  . Presence of drug coated stent in LAD coronary artery 03/26/2018  . Complete left bundle branch block (LBBB) 03/26/2018  . Coronary artery disease involving native coronary artery of native heart with angina pectoris ([place]) 03/10/2018  . Hyperlipidemia LDL goal <70 03/10/2018  . Enlarged prostate 03/10/2018  . Abnormal CT scan 03/10/2018  . Ischemic cardiomyopathy 03/10/2018  . Hypokalemia   . Tobacco abuse   . Non-ST elevation (NSTEMI) myocardial infarction ([place]) 03/06/2018  . Postmyocardial infarction angina as complication of acute myocardial infarction ([place]) 03/06/2018  . Sinus tachycardia 03/06/2018  . Encephalopathy, hypertensive   . ARF (acute renal failure) ([place]) 06/29/2015  . Hyponatremia 06/29/2015  . Dehydration 06/29/2015  . Prolonged QT interval 06/29/2015  . 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 H/O medication noncompliance   . Other headache syndrome   . Localized swelling, mass or lump of neck   . Neck mass   . History of TIA (transient ischemic attack)   . Paresthesia 07/07/2014  . Essential hypertension 07/07/2014  . Right sided weakness 07/07/2014  . Mass of right side of neck 07/07/2014   Past Medical History:  Past Medical History:  Diagnosis Date  . Abnormal chest CT 02/2018   a. Ectatic 3 cm Ao arch - rec f/u outpt imaging w/ CTA or MRA. Ectatic atheromatous abd Ao @ risk for aneurysm - rec f/u u/s in 5 yrs. Marked prostatic enlargement w/ scattered small sclerotic foci in the thoracic lower lumbar spine, sacrum, left 12th rib, pelvis, and right femur.  Recommend elective outpatient whole-body bone  scintigraphy and PSA.  Marland Kitchen CAD (coronary artery disease)    a. 02/2018 ACS/PCI: LM nl, LAD 85p (3.5x15 Sierra DES), D1 45ost, RI 55ost, RCA nl, EF 25-35%.  . Complete left bundle branch block (LBBB) 2016  . Current every day smoker   . HFrEF (heart failure with reduced ejection fraction) ([place])   . Hypertension   . Ischemic cardiomyopathy 02/2018   a. 02/2018 Echo: EF 35-40% (LV gram on cath was 25-30%), mod, diff HK. mid-apicalanteroseptal, ant, and apical sev HK. RVH.  Marland Kitchen Myocardial infarction ([place])   . Stroke ([place])   . TIA (transient ischemic attack) 2016  . Warthin's tumor    Past Surgical History:  Past Surgical History:  Procedure Laterality Date  . CORONARY STENT INTERVENTION N/A 03/06/2018   Procedure: CORONARY STENT INTERVENTION;  Surgeon: [name], MD;  Location: [facility];  Service: Cardiovascular: Proximal LAD 85% stenosis (and D1): DES PCI with Xience Sierra DES 3.5 mm x 15 mm - 3.9 mm  . INTRAOPERATIVE TRANSTHORACIC ECHOCARDIOGRAM  03/06/2018   (Peri-MI) mild reduced EF 35-40%.  Diffuse hypokinesis (severe HK of the mid-apical anteroseptal, anterior and apical myocardium consistent with LAD infarct).  Unable to assess diastolic function.  Paradoxical septal motion likely related to his LBBB.  RV hypertrophy noted.  Trivial pericardial effusion noted.   Marland Kitchen LEFT HEART CATH AND CORONARY ANGIOGRAPHY N/A 03/06/2018   Procedure: LEFT HEART CATH AND CORONARY ANGIOGRAPHY;  Surgeon: [name], MD;  Location: [facility];  Service: Cardiovascular: Proximal LAD 85% involving D1 45%.  Ostial RI 55%.  Severe LV dysfunction EF  25 to 30%.  1+ MR.  Only minimally elevated LVEDP.   HPI:  Pt is a 58 y.o. male with medical history significant of coronary artery disease status post cardiac cath with angioplasty in 2019, ischemic cardiomyopathy, hypertension, tobacco abuse, and hyperlipidemia who went to [facility] complaining of the left arm tingling weakness  as well as chest pain and was transferred to [facility] for full stroke work up. MRI of the brain showed small acute right frontoparietal infarcts.   Assessment / Plan / Recommendation Clinical Impression  Pt reported that he was living independently prior to admission and was employed full-time as a Training and development officer for [employer]. He denied any baseline deficits in speech, language or cognition. Pt initially also  denied any acute changes in these areas but at the end of the evaluation indicated that his processing of complex information may be slower than his normal.   [facility] Cognitive Assessment 8.1 was completed to evaluate the pt's cognitive-linguistic skills. He achieved a score of 27/30 which is within the normal limits of 26 or more out of 30. However, he presented with mild cognitive-linguistic deficits related to memory, attention, complex problem solving, and executive function. No motor speech or language impairments were noted. Skilled SLP services are clinically indicated at this time to improve his cognitive-linguistic skills. Pt and nursing were educated regarding results and recommendations; both parties verbalized understanding as well as agreement with plan of care.    SLP Assessment  SLP Recommendation/Assessment: Patient needs continued Speech Lanaguage Pathology Services SLP Visit Diagnosis: Cognitive communication deficit (R41.841)    Follow Up Recommendations  (TBD based on progress with acute SLP treatment. )    Frequency and Duration min 2x/week  2 weeks      SLP Evaluation Cognition  Overall Cognitive Status: Impaired/Different from baseline Arousal/Alertness: Awake/alert Orientation Level: Oriented X4 Attention: Focused;Sustained Focused Attention: Appears intact([place] WNL: 1/1) Sustained Attention: Impaired Sustained Attention Impairment: Verbal complex(Serial 7s: 2/3) Memory: Impaired Memory Impairment: Storage deficit;Retrieval deficit;Decreased  recall of new information(Immediate: 5/5; delayed: 4/5; with cue: 0/1) Awareness: Appears intact Problem Solving: Impaired Problem Solving Impairment: Verbal complex(3/5) Executive Function: Reasoning;Sequencing;Organizing Reasoning: Appears intact(Abstraction: 2/2) Sequencing: Appears intact(Clock drawing: 3/3) Organizing: Appears intact(Backward digit span: 1/1)       Comprehension  Auditory Comprehension Overall Auditory Comprehension: Appears within functional limits for tasks assessed Yes/No Questions: Within Functional Limits Commands: Impaired Complex Commands: (Trail completion: 0/1) Conversation: Complex Reading Comprehension Reading Status: Within funtional limits    Expression Expression Primary Mode of Expression: Verbal Verbal Expression Overall Verbal Expression: Appears within functional limits for tasks assessed Initiation: No impairment Level of Generative/Spontaneous Verbalization: Conversation Repetition: No impairment(2/2) Confrontation: (3/3) Divergent: (1/1) Pragmatics: No impairment Written Expression Dominant Hand: Right Written Expression: (Difficulty copying cube: 0/1)   Oral / Motor  Oral Motor/Sensory Function Overall Oral Motor/Sensory Function: Within functional limits Motor Speech Overall Motor Speech: Appears within functional limits for tasks assessed Respiration: Within functional limits Phonation: Normal Resonance: Within functional limits Articulation: Within functional limitis Intelligibility: Intelligible Motor Planning: Witnin functional limits Motor Speech Errors: Not applicable   Brynley Cuddeback I. Hardin Negus, [place], [place] Office number [phone] Pager [phone]                     Horton Marshall 11/04/2018, 5:19 PM

## 2018-11-04 NOTE — Progress Notes (Signed)
ANTICOAGULATION CONSULT NOTE   Pharmacy Consult for Heparin Indication: thrombus and CVA  No Known Allergies  Patient Measurements: Height: 6\' 1"  (185.4 cm) Weight: 131 lb (59.4 kg) IBW/kg (Calculated) : 79.9 Heparin Dosing Weight: 59.4 kg  Vital Signs: Temp: 98.9 F (37.2 C) (08/27 1949) Temp Source: Oral (08/27 1949) BP: 146/98 (08/27 1949) Pulse Rate: 92 (08/27 1949)  Labs: Recent Labs    11/03/18 1140 11/03/18 1325 11/03/18 1535 11/03/18 1857 11/04/18 0354 11/04/18 1332 11/04/18 2054  HGB 10.6*  --   --   --  10.0*  --   --   HCT 32.6*  --   --   --  30.9*  --   --   PLT 467*  --   --   --  488*  --   --   APTT 27  --   --   --   --   --   --   LABPROT 13.0  --   --   --   --   --   --   INR 1.0  --   --   --   --   --   --   HEPARINUNFRC  --   --   --   --  <0.10* <0.10* 0.14*  CREATININE 0.77  --   --   --   --   --   --   TROPONINIHS 53* 46* 73* 67*  --   --   --     Estimated Creatinine Clearance: 84.6 mL/min (by C-G formula based on SCr of 0.77 mg/dL).   Medical History: Past Medical History:  Diagnosis Date  . Abnormal chest CT 02/2018   a. Ectatic 3 cm Ao arch - rec f/u outpt imaging w/ CTA or MRA. Ectatic atheromatous abd Ao @ risk for aneurysm - rec f/u u/s in 5 yrs. Marked prostatic enlargement w/ scattered small sclerotic foci in the thoracic lower lumbar spine, sacrum, left 12th rib, pelvis, and right femur.  Recommend elective outpatient whole-body bone scintigraphy and PSA.  Marland Kitchen CAD (coronary artery disease)    a. 02/2018 ACS/PCI: LM nl, LAD 85p (3.5x15 Sierra DES), D1 45ost, RI 55ost, RCA nl, EF 25-35%.  . Complete left bundle branch block (LBBB) 2016  . Current every day smoker   . HFrEF (heart failure with reduced ejection fraction) ([place])   . Hypertension   . Ischemic cardiomyopathy 02/2018   a. 02/2018 Echo: EF 35-40% (LV gram on cath was 25-30%), mod, diff HK. mid-apicalanteroseptal, ant, and apical sev HK. RVH.  Marland Kitchen Myocardial infarction  ([place])   . Stroke ([place])   . TIA (transient ischemic attack) 2016  . Warthin's tumor     Medications:  Scheduled:  . atorvastatin  80 mg Oral Once  . [START ON 11/05/2018] clopidogrel  75 mg Oral Daily  . feeding supplement (ENSURE ENLIVE)  237 mL Oral BID BM  . nicotine  21 mg Transdermal Daily  . tamsulosin  0.4 mg Oral QPC supper   Infusions:  . sodium chloride 75 mL/hr at 11/04/18 1642  . heparin 1,200 Units/hr (11/04/18 1642)   Assessment: 28 yoM transferred from [facility] with CVA for CTA work up aortic dissection vs. Clot. No TPA was given. No AC PTA. Kary Kos is currently held. Pharmacy consulted to dose heparin. No surgery planned.  MRI brain with small right cerebral infarcts. Per neurology, small size of acute strokes, low likelihood of hemorrhagic transformation - dose heparin drip without bolus stroke  protocol.  Heparin level remains low this evening after rate increase. CBC stable. No issues with infusion or bleeding per RN.  Goal of Therapy:  Heparin level 0.3-0.5 units/ml Monitor platelets by anticoagulation protocol: Yes   Plan:  No heparin bolus Increase heparin to 1450 units/hr Heparin level in 6 hours Monitor daily heparin levels, CBC, and s/sx of bleeding  Erin Hearing PharmD., BCPS Clinical Pharmacist 11/04/2018 9:28 PM

## 2018-11-04 NOTE — Progress Notes (Signed)
ANTICOAGULATION CONSULT NOTE   Pharmacy Consult for Heparin Indication: thrombus and CVA  No Known Allergies  Patient Measurements: Height: 6\' 1"  (185.4 cm) Weight: 131 lb (59.4 kg) IBW/kg (Calculated) : 79.9 Heparin Dosing Weight: 59.4 kg  Vital Signs: BP: 147/98 (08/27 0400) Pulse Rate: 78 (08/27 0400)  Labs: Recent Labs    11/03/18 1140 11/03/18 1325 11/03/18 1535 11/03/18 1857 11/04/18 0354  HGB 10.6*  --   --   --   --   HCT 32.6*  --   --   --   --   PLT 467*  --   --   --   --   APTT 27  --   --   --   --   LABPROT 13.0  --   --   --   --   INR 1.0  --   --   --   --   HEPARINUNFRC  --   --   --   --  <0.10*  CREATININE 0.77  --   --   --   --   TROPONINIHS 53* 46* 73* 67*  --     Estimated Creatinine Clearance: 84.6 mL/min (by C-G formula based on SCr of 0.77 mg/dL).   Medical History: Past Medical History:  Diagnosis Date  . Abnormal chest CT    a. Ectatic 3 cm Ao arch - rec f/u outpt imaging w/ CTA or MRA. Ectatic atheromatous abd Ao @ risk for aneurysm - rec f/u u/s in 5 yrs. Marked prostatic enlargement w/ scattered small sclerotic foci in the thoracic lower lumbar spine, sacrum, left 12th rib, pelvis, and right femur.  Recommend elective outpatient whole-body bone scintigraphy and PSA.  Marland Kitchen CAD (coronary artery disease)    a. 02/2018 ACS/PCI: LM nl, LAD 85p (3.5x15 Sierra DES), D1 45ost, RI 55ost, RCA nl, EF 25-35%.  . Complete left bundle branch block (LBBB) 2016  . Current every day smoker   . HFrEF (heart failure with reduced ejection fraction) ([place])   . Hypertension   . Ischemic cardiomyopathy 02/2018   a. 02/2018 Echo: EF 35-40% (LV gram on cath was 25-30%), mod, diff HK. mid-apicalanteroseptal, ant, and apical sev HK. RVH.  Marland Kitchen TIA (transient ischemic attack) 2016  . Warthin's tumor     Medications:  Scheduled:  . atorvastatin  80 mg Oral Once  . nicotine  21 mg Transdermal Daily   Infusions:  . sodium chloride 75 mL/hr at 11/04/18 0032  .  heparin 950 Units/hr (11/04/18 0454)   Assessment: 9 yoM [facility] with [facility] for CTA work up aortic dissection vs. Clot. No tPa was given. No AC PTA. Kary Kos is currently held. Pharmacy consulted to dose heparin. No surgery planned.  CBC low, Hgb 10.6. Plt 467. MRI brain with small right cerebral infarcts. Per neurology, small size of acute strokes, low likelihood of hemorrhagic transformation, will do heparin drip without bolus stroke protocol.  8/27 AM update:  Heparin level undetectable No issues per RN  Goal of Therapy:  Heparin level 0.3-0.5 units/ml Monitor platelets by anticoagulation protocol: Yes   Plan:  No heparin bolus Inc heparin to 950 units/hr Heparin level in 8 hours Monitor daily heparin levels, CBC, and s/sx of bleeding  Narda Bonds, PharmD, BCPS Clinical Pharmacist Phone: [phone]

## 2018-11-04 NOTE — Progress Notes (Signed)
SLP Cancellation Note  Patient Details Name: Daniel Weiss MRN: [id number] DOB: 1960/11/26   Cancelled treatment:       Reason Eval/Treat Not Completed: Patient at procedure or test/unavailable(Pt having echo completed at this time. SLP will follow up. )  Skylynn Burkley I. Hardin Negus, [place], [place] Office number [phone] Pager [place] 11/04/2018, 3:13 PM

## 2018-11-04 NOTE — Progress Notes (Signed)
  Echocardiogram 2D Echocardiogram has been performed.  Bobbye Charleston 11/04/2018, 3:52 PM

## 2018-11-04 NOTE — ED Notes (Signed)
Ordered diet tray for pt  

## 2018-11-04 NOTE — Consult Note (Signed)
Stroke Neurology Consultation Note  Consult Requested by: [facility] (seen by teleneurology at [facility] ED yesterday)  Reason for Consult: stroke  Consult Date:  11/04/18  The history was obtained from the pt.  During history and examination, all items were able to obtain unless otherwise noted.  History of Present Illness:  Daniel Weiss is an 58 y.o. African American male with PMH of HTN, tobacco use, CAD s/p DES 02/2018 on ASA and Brilinta, benign parotid mass, and previous concern for AAA presented to [facility] ED yesterday with L arm and facial numbness and slight weakness along with CP. He did not receive IV t-PA due to minor sx/pt refusal. 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  His left arm facial numbness persists, and MRI confirmed acute right frontal parietal cortical small infarct.  Patient transfer to [facility] for further evaluation.  During my encounter, patient awake alert, stated that his left-sided face and arm numbness has been resolved.  He is on heparin drip and tolerating well.  Denies any headache or speech difficulty.  Cardiology and cardiovascular surgery involved, recommend continue anticoagulation without surgical treatment.  Recommend transition from heparin IV to Coumadin, and change Brilinta to Plavix.  Aspirin can be discontinued.   Date last known well: Date: 11/03/2018 Time last known well: Time: 10:45 tPA Given: No: minor deficits, pt refused MRS:  0 NIHSS:  2 at initial presentation, 1 when code stroke recalled  Past Medical History:  Diagnosis Date  . Abnormal chest CT 02/2018   a. Ectatic 3 cm Ao arch - rec f/u outpt imaging w/ CTA or MRA. Ectatic atheromatous abd Ao @ risk for  aneurysm - rec f/u u/s in 5 yrs. Marked prostatic enlargement w/ scattered small sclerotic foci in the thoracic lower lumbar spine, sacrum, left 12th rib, pelvis, and right femur.  Recommend elective outpatient whole-body bone scintigraphy and PSA.  Marland Kitchen CAD (coronary artery disease)    a. 02/2018 ACS/PCI: LM nl, LAD 85p (3.5x15 Sierra DES), D1 45ost, RI 55ost, RCA nl, EF 25-35%.  . Complete left bundle branch block (LBBB) 2016  . Current every day smoker   . HFrEF (heart failure with reduced ejection fraction) ([place])   . Hypertension   . Ischemic cardiomyopathy 02/2018   a. 02/2018 Echo: EF 35-40% (LV gram on cath was 25-30%), mod, diff HK. mid-apicalanteroseptal, ant, and apical sev HK. RVH.  Marland Kitchen TIA (transient ischemic attack) 2016  . Warthin's tumor      Past Surgical History:  Procedure Laterality Date  . CORONARY STENT INTERVENTION N/A 03/06/2018   Procedure: CORONARY STENT INTERVENTION;  Surgeon: [name], MD;  Location: [place] [facility];  Service: Cardiovascular: Proximal LAD 85% stenosis (and D1): DES PCI with Xience Sierra DES 3.5 mm x 15 mm - 3.9 mm  . INTRAOPERATIVE TRANSTHORACIC ECHOCARDIOGRAM  03/06/2018   (Peri-MI) mild reduced EF 35-40%.  Diffuse hypokinesis (severe HK of the mid-apical anteroseptal, anterior and apical myocardium consistent with LAD infarct).  Unable to assess diastolic function.  Paradoxical septal motion likely related to his LBBB.  RV hypertrophy noted.  Trivial pericardial effusion noted.   Marland Kitchen LEFT HEART CATH AND CORONARY ANGIOGRAPHY N/A 03/06/2018   Procedure: LEFT HEART CATH AND CORONARY ANGIOGRAPHY;  Surgeon: Ellyn Hack,  Leonie Green, MD;  Location: [place] [facility];  Service: Cardiovascular: Proximal LAD 85% involving D1 45%.  Ostial RI 55%.  Severe LV dysfunction EF 25 to 30%.  1+ MR.  Only minimally elevated LVEDP.    Family History  Problem Relation Age of Onset  . Stroke Mother   . Hypertension Mother   . Diabetes type II Mother   . Leukemia  Other   . Breast cancer Other   . CAD Neg Hx      Social History:  reports that he has been smoking cigarettes. He has been smoking about 0.50 packs per day. He quit smokeless tobacco use about 8 months ago. He reports current alcohol use of about 2.0 standard drinks of alcohol per week. He reports current drug use. Frequency: 3.00 times per week. Drug: Marijuana.  Review of Systems: A full ROS was attempted today and was able to be performed.  Systems assessed include - Constitutional, Eyes, HENT, Respiratory, Cardiovascular, Gastrointestinal, Genitourinary, Integument/breast, Hematologic/lymphatic, Musculoskeletal, Neurological, Behavioral/Psych, Endocrine, Allergic/Immunologic - with pertinent responses as per HPI.  Allergies: No Known Allergies   Test Results: CBC:  Recent Labs  Lab 11/03/18 1140 11/04/18 0354  WBC 8.0 6.3  NEUTROABS 4.7  --   HGB 10.6* 10.0*  HCT 32.6* 30.9*  MCV 81.1 81.7  PLT 467* [id number]*   Basic Metabolic Panel:  Recent Labs  Lab 11/03/18 1140  NA 134*  K 3.8  CL 100  CO2 24  GLUCOSE 107*  BUN 5*  CREATININE 0.77  CALCIUM 8.6*   Liver Function Tests: Recent Labs  Lab 11/03/18 1140  AST 16  ALT 9  ALKPHOS 84  BILITOT 0.4  PROT 6.6  ALBUMIN 2.9*   No results for input(s): LIPASE, AMYLASE in the last 168 hours. No results for input(s): AMMONIA in the last 168 hours. Coagulation Studies:  Recent Labs    11/03/18 1140  LABPROT 13.0  INR 1.0   Cardiac Enzymes: No results for input(s): CKTOTAL, CKMB, CKMBINDEX, TROPONINI in the last 168 hours. BNP: Invalid input(s): POCBNP CBG:  Recent Labs  Lab 11/03/18 1123  GLUCAP 114*   Urinalysis:  Recent Labs  Lab 11/03/18 [place]  LABSPEC <1.005*  PHURINE 6.5  GLUCOSEU NEGATIVE  HGBUR TRACE*  BILIRUBINUR NEGATIVE  KETONESUR NEGATIVE  PROTEINUR NEGATIVE  NITRITE NEGATIVE  LEUKOCYTESUR NEGATIVE   Microbiology:  Results for orders placed or performed during the  hospital encounter of 11/03/18  SARS CORONAVIRUS 2 (TAT 6-12 HRS) Nasal Swab Aptima Multi Swab     Status: None   Collection Time: 11/03/18 11:57 AM   Specimen: Aptima Multi Swab; Nasal Swab  Result Value Ref Range Status   SARS Coronavirus 2 NEGATIVE NEGATIVE Final    Comment: (NOTE) SARS-CoV-2 target nucleic acids are NOT DETECTED. The SARS-CoV-2 RNA is generally detectable in upper and lower respiratory specimens during the acute phase of infection. Negative results do not preclude SARS-CoV-2 infection, do not rule out co-infections with other pathogens, and should not be used as the sole basis for treatment or other patient management decisions. Negative results must be combined with clinical observations, patient history, and epidemiological information. The expected result is Negative. Fact Sheet for Patients: SugarRoll.be Fact Sheet for Healthcare Providers: https://www.woods-mathews.com/ This test is not yet approved or cleared by the [place] FDA and  has been authorized for detection and/or diagnosis of SARS-CoV-2 by FDA under an Emergency Use Authorization (EUA). This EUA will remain  in effect (meaning this  test can be used) for the duration of the COVID-19 declaration under Section 56 4(b)(1) of the Act, 21 U.S.C. section 360bbb-3(b)(1), unless the authorization is terminated or revoked sooner. Performed at [facility], [place] [address] [place], [place] [address]    Lipid Panel:     Component Value Date/Time   CHOL 118 11/04/2018 0354   TRIG 70 11/04/2018 0354   HDL 37 (L) 11/04/2018 0354   CHOLHDL 3.2 11/04/2018 0354   VLDL 14 11/04/2018 0354   LDLCALC 67 11/04/2018 0354   HgbA1c:  Lab Results  Component Value Date   HGBA1C 5.3 11/04/2018   Urine Drug Screen:     Component Value Date/Time   LABOPIA NONE DETECTED 11/03/2018 1304   COCAINSCRNUR NONE DETECTED 11/03/2018 1304   LABBENZ NONE DETECTED  11/03/2018 1304   AMPHETMU NONE DETECTED 11/03/2018 1304   THCU POSITIVE (A) 11/03/2018 1304   LABBARB NONE DETECTED 11/03/2018 1304    Alcohol Level:  Recent Labs  Lab 11/03/18 1140  ETH           EKG: sinus rhythm. Probable left atrial enlargement. IVCD, consider atypical LBBB. When compared with ECG of EARLIER SAME DATE - No significant change was found  Physical Examination: Temp:  [99 F (37.2 C)] 99 F (37.2 C) (08/26 1517) Pulse Rate:  [58-86] 78 (08/27 1200) Resp:  [11-30] 21 (08/27 1200) BP: (106-167)/(67-108) 138/85 (08/27 1200) SpO2:  [96 %-100 %] 97 % (08/27 1200)  General - Well nourished, well developed, in no apparent distress.  Ophthalmologic - fundi not visualized due to noncooperation.  Cardiovascular - Regular rate and rhythm.  Mental Status -  Level of arousal and orientation to time, place, and person were intact. Language including expression, naming, repetition, comprehension was assessed and found intact. Fund of Knowledge was assessed and was intact.  Cranial Nerves II - XII - II - Visual field intact OU. III, IV, VI - Extraocular movements intact. V - Facial sensation intact bilaterally. VII - Facial movement intact bilaterally. VIII - Hearing & vestibular intact bilaterally. X - Palate elevates symmetrically. XI - Chin turning & shoulder shrug intact bilaterally. XII - Tongue protrusion intact.  Motor Strength - The patient's strength was normal in all extremities and pronator drift was absent.  Bulk was normal and fasciculations were absent.   Motor Tone - Muscle tone was assessed at the neck and appendages and was normal.  Reflexes - The patient's reflexes were symmetrical in all extremities and he had no pathological reflexes.  Sensory - Light touch, temperature/pinprick were assessed and were symmetrical.    Coordination - The patient had normal movements in the hands and feet with no ataxia or dysmetria.  Tremor was absent.  Gait  and Station - deferred.    Data Reviewed: Ct Angio Head W Or Wo Contrast  Result Date: 11/03/2018 CLINICAL DATA:  Acute onset of chest pain today. Numbness of the left arm and hand. EXAM: CT ANGIOGRAPHY HEAD AND NECK TECHNIQUE: Multidetector CT imaging of the head and neck was performed using the standard protocol during bolus administration of intravenous contrast. Multiplanar CT image reconstructions and MIPs were obtained to evaluate the vascular anatomy. Carotid stenosis measurements (when applicable) are obtained utilizing NASCET criteria, using the distal internal carotid diameter as the denominator. CONTRAST:  157mL OMNIPAQUE IOHEXOL 350 MG/ML SOLN COMPARISON:  Head CT earlier same day FINDINGS: CTA NECK FINDINGS Aortic arch: Arch is negative. Branching pattern is normal without stenosis. Right carotid system: Common  carotid artery widely patent to the bifurcation. No carotid bifurcation stenosis or irregularity. Minimal plaque in the ICA bulb. Left carotid system: Common carotid artery widely patent to the bifurcation. Mild atherosclerotic plaque at the carotid bifurcation and proximal external carotid. No internal carotid stenosis or irregularity. Vertebral arteries: Both vertebral artery origins are patent. Mild atherosclerotic plaque on the right but no flow limiting stenosis. On the left, there is 50% stenosis due to focal calcified plaque. Both vertebral arteries are patent through the cervical region to the foramen magnum. Skeleton: Degenerative cervical spondylosis. Prominent osteophytes/disc herniations at C4-5 and C5-6 that could cause symptomatic stenosis of the canal and or foramina. Other neck: Large right parotid mass involving both the deep lobe and superficial lobe extending inferior from the superficial lobe. This has been seen on multiple previous examinations and is not changing, consistent with benign nature. Axial measurements are unchanged since 2017 at 32 x 43 mm. Upper chest:  Negative Review of the MIP images confirms the above findings CTA HEAD FINDINGS Anterior circulation: Both internal carotid arteries are patent through the skull base and siphon regions. The anterior and middle cerebral vessels are patent without proximal stenosis, aneurysm or vascular malformation. No embolic occlusions are identified. Posterior circulation: Both vertebral arteries are widely patent through the foramen magnum to the basilar. The right is dominant. No basilar stenosis. Posterior circulation branch vessels are patent. Patent posterior communicating arteries on each side. Venous sinuses: Patent and normal. Anatomic variants: None significant. Review of the MIP images confirms the above findings IMPRESSION: No acute vascular finding to explain the clinical presentation. No sign of dissection or occlusion of the brachiocephalic or intracranial vessels. Mild atherosclerotic plaque as above but without flow limiting stenosis in the anterior circulation vessels. Posterior circulation vessels are patent. Atherosclerotic disease at both vertebral artery origins without measurable stenosis on the right and with focal 50% stenosis on the left. 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 Advanced cervical degenerative disease with spinal stenosis and foraminal stenosis at C4-5 and C5-6 that could be significant. Electronically Signed   By: Nelson Chimes M.D.   On: 11/03/2018 13:40   Ct Angio Neck W And/or Wo Contrast  Result Date: 11/03/2018 CLINICAL DATA:  Acute onset of chest pain today. Numbness of the left arm and hand. EXAM: CT ANGIOGRAPHY HEAD AND NECK TECHNIQUE: Multidetector CT imaging of the head and neck was performed using the standard protocol during bolus administration of intravenous contrast. Multiplanar CT image reconstructions and MIPs were obtained to evaluate the vascular anatomy. Carotid stenosis measurements  (when applicable) are obtained utilizing NASCET criteria, using the distal internal carotid diameter as the denominator. CONTRAST:  181mL OMNIPAQUE IOHEXOL 350 MG/ML SOLN COMPARISON:  Head CT earlier same day FINDINGS: CTA NECK FINDINGS Aortic arch: Arch is negative. Branching pattern is normal without stenosis. Right carotid system: Common carotid artery widely patent to the bifurcation. No carotid bifurcation stenosis or irregularity. Minimal plaque in the ICA bulb. Left carotid system: Common carotid artery widely patent to the bifurcation. Mild atherosclerotic plaque at the carotid bifurcation and proximal external carotid. No internal carotid stenosis or irregularity. Vertebral arteries: Both vertebral artery origins are patent. Mild atherosclerotic plaque on the right but no flow limiting stenosis. On the left, there is 50% stenosis due to focal calcified plaque. Both vertebral arteries are patent through the cervical region to the foramen magnum. Skeleton: Degenerative cervical spondylosis. Prominent osteophytes/disc herniations at C4-5  and C5-6 that could cause symptomatic stenosis of the canal and or foramina. Other neck: Large right parotid mass involving both the deep lobe and superficial lobe extending inferior from the superficial lobe. This has been seen on multiple previous examinations and is not changing, consistent with benign nature. Axial measurements are unchanged since 2017 at 32 x 43 mm. Upper chest: Negative Review of the MIP images confirms the above findings CTA HEAD FINDINGS Anterior circulation: Both internal carotid arteries are patent through the skull base and siphon regions. The anterior and middle cerebral vessels are patent without proximal stenosis, aneurysm or vascular malformation. No embolic occlusions are identified. Posterior circulation: Both vertebral arteries are widely patent through the foramen magnum to the basilar. The right is dominant. No basilar stenosis. Posterior  circulation branch vessels are patent. Patent posterior communicating arteries on each side. Venous sinuses: Patent and normal. Anatomic variants: None significant. Review of the MIP images confirms the above findings IMPRESSION: No acute vascular finding to explain the clinical presentation. No sign of dissection or occlusion of the brachiocephalic or intracranial vessels. Mild atherosclerotic plaque as above but without flow limiting stenosis in the anterior circulation vessels. Posterior circulation vessels are patent. Atherosclerotic disease at both vertebral artery origins without measurable stenosis on the right and with focal 50% stenosis on the left. 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 Advanced cervical degenerative disease with spinal stenosis and foraminal stenosis at C4-5 and C5-6 that could be significant. Electronically Signed   By: Nelson Chimes M.D.   On: 11/03/2018 13:40   Mr Brain Wo Contrast  Result Date: 11/03/2018 CLINICAL DATA:  Left upper extremity weakness/numbness and chest pain. EXAM: MRI HEAD WITHOUT CONTRAST TECHNIQUE: Multiplanar, multiecho pulse sequences of the brain and surrounding structures were obtained without intravenous contrast. COMPARISON:  Head CT 11/03/2018 and MRI 06/30/2015 FINDINGS: Brain: A few subcentimeter acute infarcts are present in the posterior right frontal and right parietal lobes involving cortex and subcortical white matter. Small foci of T2 hyperintensity in the cerebral white matter bilaterally have mildly progressed from 2017 and are nonspecific but compatible with mildly age advanced chronic small vessel ischemic disease. The ventricles and sulci are within normal limits for age. Vascular: Major intracranial vascular flow voids are preserved. Skull and upper cervical spine: Unremarkable bone marrow signal. Sinuses/Orbits: Unremarkable orbits. Minimal scattered mucosal  thickening in the paranasal sinuses. Clear mastoid air cells. Other: Partially visualized large right parotid region mass as seen on multiple prior examinations. IMPRESSION: 1. Small acute right frontoparietal infarcts. 2. Mild chronic small vessel ischemic disease, mildly progressed from 2017. Electronically Signed   By: Logan Bores M.D.   On: 11/03/2018 18:37   Ct Coronary Morph W/cta Cor W/score W/ca W/cm &/or Wo/cm  Addendum Date: 11/04/2018   ADDENDUM REPORT: 11/04/2018 12:42 CLINICAL DATA:  35M with CAD s/p LAD PCI, chronic systolic and diastolic heart failure, LBBB, hypertension, tobacco abuse and hyperlipidemia admitted with acute embolic stroke and peripheral emboli. EXAM: Cardiac/Coronary  CT TECHNIQUE: The patient was scanned on a [employer]. FINDINGS: A 120 kV prospective scan was triggered in the descending thoracic aorta at 111 HU's. Axial non-contrast 3 mm slices were carried out through the heart. The data set was analyzed on a dedicated work station and scored using the [place]. Gantry rotation speed was 250 msecs and collimation was .6 mm. No beta blockade and 0.8 mg of sl NTG was given. The  3D data set was reconstructed in 5% intervals of the 67-82 % of the R-R cycle. Diastolic phases were analyzed on a dedicated work station using MPR, MIP and VRT modes. The patient received 80 cc of contrast. Aorta: Normal size. Ascending aorta 3.5 cm. No calcifications. There is an intimal irregularity with a filling defect in the ascending aorta as previously described. Normal variant bovine aortic arch noted. Aortic Valve:  Trileaflet.  No calcifications. Coronary Arteries:  Normal coronary origin.  Right dominance. RCA is a large dominant artery that gives rise to PDA and PLVB. There is diffuse minimal (<25%) to mild (25-49%) calcified plaque. Left main is a large artery that gives rise to LAD and LCX arteries. There is minimal (<25%) calcified plaque. LAD is a large vessel. Proximal  LAD stent is patent. The proximal LAD is heavily calcified but there is minimal obstruction. There is minimal mixed plaque in the mid to apical LAD with associated positive remodeling. There is mild (25-49%) calcified plaque in proximal D1. LCX is a non-dominant artery. There is moderate calcified plaque in the proximal to mid LCX. RI has mild (25-49%) calcified plaque in the ostium and proximal regions. Other findings: Normal pulmonary vein drainage into the left atrium. Normal let atrial appendage without a thrombus. Normal size of the pulmonary artery. IMPRESSION: 1. Normal coronary origin with right dominance. 2. Patent proximal LAD stent. 3.  Non-obstructing plaque in all coronary distributions. 4. Intimal irregularity with filling defect in the ascending aorta as noted by Radiology. 5.  No left ventricular thrombus noted. Skeet Latch, MD Electronically Signed   By: Skeet Latch   On: 11/04/2018 12:42   Result Date: 11/04/2018 EXAM: OVER-READ INTERPRETATION  CT CHEST The following report is an over-read performed by radiologist Dr. Rolm Baptise of [facility], [place] on 11/03/2018. This over-read does not include interpretation of cardiac or coronary anatomy or pathology. The coronary CTA interpretation by the cardiologist is attached. COMPARISON:  CTA chest earlier today FINDINGS: Vascular: There is intimal irregularity with filling defect extending into the lumen of the distal ascending thoracic aorta near the proximal aortic arch. This is stable since study earlier today. No evidence of thoracic aortic aneurysm. Mediastinum/Nodes: No mediastinal, hilar, or axillary adenopathy. Lungs/Pleura: Lungs clear.  No effusions. Upper Abdomen: Imaging into the upper abdomen shows no acute findings. Musculoskeletal: Scattered sclerotic densities seen in the thoracic spine as described on prior CT. IMPRESSION: Intimal irregularity with luminal filling defect in the proximal arch/distal ascending thoracic  aorta, stable since prior study. Electronically Signed: By: Rolm Baptise M.D. On: 11/03/2018 17:07   Dg Chest Portable 1 View  Result Date: 11/03/2018 CLINICAL DATA:  Pt states he woke up with chest pain this morning. He took 1 nitroglycerin which relieved the pain. One hour ( between 1030-1045) ago he reports numbness to L lower arm and hand., hx of TIA, HTN, no other complaints EXAM: PORTABLE CHEST 1 VIEW COMPARISON:  Chest radiographs dated 12/28 2019, 06/29/2015 FINDINGS: The heart size and mediastinal contours are within normal limits. The lungs are clear. No pneumothorax or pleural effusion. No acute abnormality in the visualized skeleton. IMPRESSION: Normal chest radiograph. Electronically Signed   By: Audie Pinto M.D.   On: 11/03/2018 11:44   Ct Angio Chest/abd/pel For Dissection W And/or Wo Contrast  Result Date: 11/03/2018 CLINICAL DATA:  58 year old male with acute chest and back pain as well as stroke-like symptoms concerning for dissection. EXAM: CT ANGIOGRAPHY CHEST, ABDOMEN AND PELVIS TECHNIQUE: Multidetector CT  imaging through the chest, abdomen and pelvis was performed using the standard protocol during bolus administration of intravenous contrast. Multiplanar reconstructed images and MIPs were obtained and reviewed to evaluate the vascular anatomy. CONTRAST:  158mL OMNIPAQUE IOHEXOL 350 MG/ML SOLN COMPARISON:  Prior CTA of the chest abdomen and pelvis 03/06/2018 FINDINGS: CTA CHEST FINDINGS Cardiovascular: 2 vessel aortic arch. The right brachiocephalic and left common carotid artery share a common origin. On the unenhanced images, there is no evidence of intramural high attenuation to suggest intramural hematoma. Following administration of intravenous contrast, there is very subtle irregularity of the intima in the ascending thoracic aorta just proximal to the aortic arch. There is a linear irregular filling defect extending into the aortic lumen measuring approximately 1.4 cm in  length by 0.2 mm in width. This almost certainly represents an area of linear thrombus adherent to the aortic wall. No evidence of aortic wall thickening, stranding in the mediastinal fat or hemorrhage. The remainder of the aortic arch and descending thoracic aorta are normal. The aortic root is normal. The heart is normal in size. Calcifications are present along the left main, left anterior descending, circumflex and right coronary artery. No pericardial effusion. Mediastinum/Nodes: Unremarkable CT appearance of the thyroid gland. No suspicious mediastinal or hilar adenopathy. No soft tissue mediastinal mass. The thoracic esophagus is unremarkable. Lungs/Pleura: Lungs are clear. No pleural effusion or pneumothorax. Musculoskeletal: No acute fracture or aggressive appearing lytic or blastic osseous lesion. Small low-attenuation lesion just deep to the skin surface on the right anterior chest wall just above the nipple line likely represents a sebaceous or other benign cutaneous cyst. Review of the MIP images confirms the above findings. CTA ABDOMEN AND PELVIS FINDINGS VASCULAR Aorta: The aorta is normal in caliber without evidence of aneurysm. Compared to the prior study from December of 2019, there has been significant interval progression of fibrofatty atherosclerotic plaque which is become irregular and ulcerated beginning at the aortic hiatus and extending through the visceral, renal and infrarenal segments of the aorta. Just below the renal arteries, the lumen is narrowed by approximately 70%. Celiac: Patent without evidence of aneurysm, dissection, vasculitis or significant stenosis. SMA: Patent without evidence of aneurysm, dissection, vasculitis or significant stenosis. Renals: Both renal arteries are patent without evidence of aneurysm, dissection, vasculitis, fibromuscular dysplasia or significant stenosis. IMA: Patent without evidence of aneurysm, dissection, vasculitis or significant stenosis. Inflow:  Heterogeneous calcified and noncalcified atherosclerotic plaque without significant stenosis in either common iliac artery. No evidence of dissection. The left internal iliac artery is occluded at the origin but ultimately reconstitutes. The external iliac arteries are patent. 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 Veins: No focal venous abnormality. Review of the MIP images confirms the above findings. NON-VASCULAR Hepatobiliary: Normal morphology. Stable circumscribed low-attenuation lesions which almost certainly represent benign cysts. Gallbladder is unremarkable. No intra or extrahepatic biliary ductal dilatation. Pancreas: Unremarkable. No pancreatic ductal dilatation or surrounding inflammatory changes. Spleen: Normal in size without focal abnormality. Adrenals/Urinary Tract: Normal adrenal glands. No evidence of hydronephrosis, nephrolithiasis or enhancing renal mass. Stomach/Bowel: Stomach is within normal limits. Appendix appears normal. No evidence of bowel wall thickening, distention, or inflammatory changes. Lymphatic: No suspicious lymphadenopathy. Reproductive: Asymmetric enhancement of the right posterolateral prostate gland with patchy enhancement elsewhere. Findings are significantly more conspicuous compared to prior imaging. Other: No abdominal wall hernia or abnormality. No abdominopelvic ascites. Musculoskeletal: Multifocal sclerotic foci present throughout the skeleton. An index lesion in the  left hemi sacrum measures slightly larger compared to the prior study at 0.9 cm compared to 0.8 cm previously. A second index lesion in the right transverse process of T1 measures 1.0 cm compared to 0.8 cm previously. Numerous additional lesions are present scattered throughout the thoracic and lumbar spine. These lesions are significantly smaller and not dramatically changed. Lesions are also identified in both iliac bones, bilateral ischial tuberosities,  ribs and the right femoral head. The smaller of the 2 femoral head lesions also appears slightly enlarged. Review of the MIP images confirms the above findings. IMPRESSION: CTA CHEST 1. 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 This is just proximal to the origin of the right brachiocephalic artery which supplies both carotid arteries. This lesion is at high risk for embolization which could cause additional large vessel occlusion in the cerebral vasculature. The intimal irregularity could represent the form fruste of an aortic dissection if the patient has significant hypertension although the abnormality is confined to the intima. There is no evidence of thickening or hemorrhage within the aortic media. 2. Left main and 3 vessel coronary artery disease. CTA ABD/PELVIS 1. No evidence of acute aortic dissection or aneurysm. 2. However, there has been significant interval progression of irregular/ulcerated hypoechoic plaque and likely wall adherent mural thrombus within the visceral, renal and infrarenal abdominal aorta. 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 3. 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 There is irregular patchy enhancement throughout the prostate gland more (more on the right than the left) with numerous multifocal sclerotic bone lesions in the pelvis, right femoral head, lumbar and thoracic spine and ribs. At least a few of the small sclerotic foci have subtly enlarged compared to prior imaging. Recommend correlation with serum PSA and referral to urology. These results were called by telephone at the time of interpretation on 11/03/2018 at 1:11 pm to Dr. Sherwood Gambler , who verbally acknowledged these results.  Signed, Criselda Peaches, MD, [facility] Radiology Electronically Signed   By: Jacqulynn Cadet M.D.   On: 11/03/2018 13:59   Ct Head Code Stroke Wo Contrast  Result Date: 11/03/2018 CLINICAL DATA:  Code stroke.  Left arm numbness EXAM: CT HEAD WITHOUT CONTRAST TECHNIQUE: Contiguous axial images were obtained from the base of the skull through the vertex without intravenous contrast. COMPARISON:  06/30/2015 brain MRI FINDINGS: Brain: No evidence of acute infarction, hemorrhage, hydrocephalus, extra-axial collection or mass lesion/mass effect. Vascular: No hyperdense vessel or unexpected calcification. Skull: Normal. Negative for fracture or focal lesion. Sinuses/Orbits: No acute finding. Other: These results were called by telephone at the time of interpretation on 11/03/2018 at 11:39 am to Dr. Sherwood Gambler , who verbally acknowledged these results. ASPECTS [facility] Stroke Program Early CT Score) - Ganglionic level infarction (caudate, lentiform nuclei, internal capsule, insula, M1-M3 cortex): 7 - Supraganglionic infarction (M4-M6 cortex): 3 Total score (0-10 with 10 being normal): 10 IMPRESSION: Negative head CT.ASPECTS is 10. Electronically Signed   By: Monte Fantasia M.D.   On: 11/03/2018 11:41     Assessment:  Mr. Harlo Hammers is a 58 y.o. male with history of HTN, tobacco use, CAD s/p DES 02/2018 on ASA and Brilinta, and previous concern for AAA presenting  to [facility] with L arm and facial numbness and CP. He did not receive IV t-PA due to minor sx/pt refusal. CTA chest reveals concern for aortic thrombus and possibility of metastatic prostate cancer w/ multiple sclerotic bone lesions. MRI confirmed right frontoparietal cortical [place] more acute infarct.   Stroke: Small R frontoparietal infarcts embolic likely secondary to ascending aortic mobile thrombus vs hypercoagulable from undx metastatic prostate Ca  Code Stroke CT head  negative. ASPECTS 10.      CTA head & neck no ELVO. No dissection or occlusions. Mild atherosclerotic plaque w/o flow limiting stenosis, R VA 50%. Large R parotid and parotid region mass stable since 2016.   MRI  Small R frontoparietal infarcts  2D Echo EF 35-40%  LDL 67  HgbA1c 5.3   IV heparin for VTE prophylaxis  aspirin 81 mg daily and clopidogrel 75 mg daily prior to admission, now on heparin IV.  Agree with cardiology and cardiovascular surgery, heparin IV can gradually switch to Coumadin.  Change Brilinta to Plavix, and discontinue aspirin.  Therapy recommendations:  pending   Disposition:  pending   Ascending Aorta thrombosis vs atherosclerotic plaque vs vegetation  CT chest irregularity in ascending aorta w/ linear thrombus extending from focus of irregularity into aortic lumen, just proximal to origin of R brachiocephalic artery which supplies both carotids. L main and 3v CAD  CT abd/pelvis no AAA or dissection. Significant progression of irregular/ulcerated hypoechoic plaque and likely call adherent mural thrombus in visceral, renal and infrarenal abd aorta. Findings concerning for prostate ca w/ progressive osseous metastatic dz. irreg patchy enhancement throughtout the prostate R>L with numerous multifocal sclerotic bone lesions in the pelvis, R femoral head, lumbar and thoracic spine and ribs. A few have enlarged significantly compared to previous imaging.    CT coronary morph w/ CTA - nml coronary origin w/ R dominance. Patent prox LAD stent. Non-obstructing plaque. Intimal irregularity w/ filling defect in ascending aorta. No LV thrombus.   On IV heparin  Seen by CVTS and cardiology who recommends stop Brilinta and change to plavix. Stop ASA. Switch heparin IV to coumadin once stable. Recommend cancer work up.    Hypertension  Stable . From stroke standpoint, permissive hypertension (OK if < 180/105) but gradually normalize in 3-5 days . Long-term BP goal  normotensive  Hyperlipidemia  Home meds:  lipitor 80, resumed in hospital  LDL 67, goal < 70  Continue statin at discharge  Other Stroke Risk Factors  Cigarette smoker, advised to stop smoking  Quit smokeless tobacco 8 mos ago  ETOH use, advised to drink no more than 2 drink(s) a day  THC use  No Hx TIA/storoke - 4/ 2016 admitted w/ BUE numbness. Found to have hypertensive encephalopathy and R neck mass (bx benign tumor). Neuro sighed off.  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 No new recommendation from neurology. Neurology will sign off. Please call with questions. Pt will follow up with stroke clinic NP at [facility] in about 4 weeks. Thanks for the consult.  Rosalin Hawking, MD PhD Stroke Neurology 11/04/2018 6:44 PM  To contact Stroke Continuity provider, please refer to [email or web address] After hours, contact General Neurology

## 2018-11-05 ENCOUNTER — Inpatient Hospital Stay (HOSPITAL_COMMUNITY): Payer: Medicaid Other

## 2018-11-05 DIAGNOSIS — I7410 Embolism and thrombosis of unspecified parts of aorta: Secondary | ICD-10-CM

## 2018-11-05 DIAGNOSIS — E785 Hyperlipidemia, unspecified: Secondary | ICD-10-CM

## 2018-11-05 DIAGNOSIS — I255 Ischemic cardiomyopathy: Secondary | ICD-10-CM

## 2018-11-05 LAB — CBC
HCT: 30.2 % — ABNORMAL LOW (ref 39.0–52.0)
Hemoglobin: 10.2 g/dL — ABNORMAL LOW (ref 13.0–17.0)
MCH: 26.7 pg (ref 26.0–34.0)
MCHC: 33.8 g/dL (ref 30.0–36.0)
MCV: 79.1 fL — ABNORMAL LOW (ref 80.0–100.0)
Platelets: 420 10*3/uL — ABNORMAL HIGH (ref 150–400)
RBC: 3.82 MIL/uL — ABNORMAL LOW (ref 4.22–5.81)
RDW: 16.3 % — ABNORMAL HIGH (ref 11.5–15.5)
WBC: 6.4 10*3/uL (ref 4.0–10.5)
nRBC: 0 % (ref 0.0–0.2)

## 2018-11-05 LAB — HEPARIN LEVEL (UNFRACTIONATED)
Heparin Unfractionated: 0.24 IU/mL — ABNORMAL LOW (ref 0.30–0.70)
Heparin Unfractionated: 0.58 IU/mL (ref 0.30–0.70)

## 2018-11-05 LAB — PROTIME-INR
INR: 1.1 (ref 0.8–1.2)
Prothrombin Time: 14.1 seconds (ref 11.4–15.2)

## 2018-11-05 MED ORDER — WARFARIN SODIUM 7.5 MG PO TABS
7.5000 mg | ORAL_TABLET | Freq: Once | ORAL | Status: AC
  Administered 2018-11-05: 7.5 mg via ORAL
  Filled 2018-11-05: qty 1

## 2018-11-05 MED ORDER — CARVEDILOL 3.125 MG PO TABS
3.1250 mg | ORAL_TABLET | Freq: Two times a day (BID) | ORAL | Status: DC
  Administered 2018-11-05 – 2018-11-10 (×10): 3.125 mg via ORAL
  Filled 2018-11-05 (×10): qty 1

## 2018-11-05 MED ORDER — ENSURE ENLIVE PO LIQD
237.0000 mL | Freq: Three times a day (TID) | ORAL | Status: DC
  Administered 2018-11-05 – 2018-11-12 (×20): 237 mL via ORAL

## 2018-11-05 MED ORDER — TECHNETIUM TC 99M MEDRONATE IV KIT
21.7000 | PACK | Freq: Once | INTRAVENOUS | Status: AC | PRN
  Administered 2018-11-05: 21.7 via INTRAVENOUS

## 2018-11-05 MED ORDER — WARFARIN - PHARMACIST DOSING INPATIENT
Freq: Every day | Status: DC
  Administered 2018-11-05 – 2018-11-11 (×3)

## 2018-11-05 NOTE — Progress Notes (Signed)
ANTICOAGULATION CONSULT NOTE   Pharmacy Consult for Heparin Indication: thrombus and CVA  No Known Allergies  Patient Measurements: Height: 6\' 1"  (185.4 cm) Weight: 131 lb (59.4 kg) IBW/kg (Calculated) : 79.9 Heparin Dosing Weight: 59.4 kg  Vital Signs: Temp: 98.6 F (37 C) (08/28 0326) Temp Source: Oral (08/28 0326) BP: 130/88 (08/28 0326) Pulse Rate: 86 (08/28 0326)  Labs: Recent Labs    11/03/18 1140 11/03/18 1325 11/03/18 1535 11/03/18 1857  11/04/18 0354 11/04/18 1332 11/04/18 2054 11/05/18 0454  HGB 10.6*  --   --   --   --  10.0*  --   --  10.2*  HCT 32.6*  --   --   --   --  30.9*  --   --  30.2*  PLT 467*  --   --   --   --  488*  --   --  420*  APTT 27  --   --   --   --   --   --   --   --   LABPROT 13.0  --   --   --   --   --   --   --   --   INR 1.0  --   --   --   --   --   --   --   --   HEPARINUNFRC  --   --   --   --    < > <0.10* <0.10* 0.14* 0.24*  CREATININE 0.77  --   --   --   --   --   --   --   --   TROPONINIHS 53* 46* 73* 67*  --   --   --   --   --    < > = values in this interval not displayed.    Estimated Creatinine Clearance: 84.6 mL/min (by C-G formula based on SCr of 0.77 mg/dL).   Medical History: Past Medical History:  Diagnosis Date  . Abnormal chest CT 02/2018   a. Ectatic 3 cm Ao arch - rec f/u outpt imaging w/ CTA or MRA. Ectatic atheromatous abd Ao @ risk for aneurysm - rec f/u u/s in 5 yrs. Marked prostatic enlargement w/ scattered small sclerotic foci in the thoracic lower lumbar spine, sacrum, left 12th rib, pelvis, and right femur.  Recommend elective outpatient whole-body bone scintigraphy and PSA.  Marland Kitchen CAD (coronary artery disease)    a. 02/2018 ACS/PCI: LM nl, LAD 85p (3.5x15 [place] DES), D1 45ost, RI 55ost, RCA nl, EF 25-35%.  . Complete left bundle branch block (LBBB) 2016  . Current every day smoker   . HFrEF (heart failure with reduced ejection fraction) ([place])   . Hypertension   . Ischemic cardiomyopathy 02/2018    a. 02/2018 Echo: EF 35-40% (LV gram on cath was 25-30%), mod, diff HK. mid-apicalanteroseptal, ant, and apical sev HK. RVH.  Marland Kitchen Myocardial infarction ([place])   . Stroke ([place])   . TIA (transient ischemic attack) 2016  . Warthin's tumor     Medications:  Scheduled:  . atorvastatin  80 mg Oral Once  . clopidogrel  75 mg Oral Daily  . feeding supplement (ENSURE ENLIVE)  237 mL Oral BID BM  . nicotine  21 mg Transdermal Daily  . tamsulosin  0.4 mg Oral QPC supper   Infusions:  . sodium chloride 75 mL/hr at 11/04/18 1642  . heparin 1,450 Units/hr (11/05/18 0005)   Assessment: 64 yoM transferred  from [facility] with CVA for CTA work up aortic dissection vs. Clot. No tPa was given. No AC PTA. Kary Kos is currently held. Pharmacy consulted to dose heparin. No surgery planned.  CBC low, Hgb 10.6. Plt 467. MRI brain with small right cerebral infarcts. Per neurology, small size of acute strokes, low likelihood of hemorrhagic transformation, will do heparin drip without bolus stroke protocol.  8/28 AM update:  Heparin level low but trending up No issues per RN  Goal of Therapy:  Heparin level 0.3-0.5 units/ml Monitor platelets by anticoagulation protocol: Yes   Plan:  No heparin boluses Inc heparin to 1600 units/hr Heparin level in 8 hours Monitor daily heparin levels, CBC, and s/sx of bleeding  Narda Bonds, PharmD, BCPS Clinical Pharmacist Phone: [phone]

## 2018-11-05 NOTE — Progress Notes (Signed)
   11/05/18 0028  Provider Notification  Provider Name/Title NP C. Bodenheimer  Date Provider Notified 11/05/18  Time Provider Notified 0027  Notification Type Page  Notification Reason Other (Comment) (Tele called that pt had a 4 beats Vtach at 0015)  Response Other (Comment)  Date of Provider Response  (awaiting response)

## 2018-11-05 NOTE — Progress Notes (Signed)
Physical Therapy Treatment Patient Details Name: Daniel Weiss MRN: [id number] DOB: [birth year] Today's Date: 11/05/2018    History of Present Illness Pt is a  58 yo male Admitted secondary to LUE numbness.  MRI: Small acute right frontoparietal infarcts. CT angio revealing dissection vs clot and possible metastic dx of prostate with multiple sclerotic bone lesions. PMHX: HLD, HLD, s/p cardiac angio.    PT Comments    Pt progressing well and met goals. Improved steadiness noted and pt able to perform dynamic gait tasks and stair navigation without LOB.  Per pt, plan is to go to outpatient PT in order to prepare for return to work. Updated recommendations. No further skilled acute PT intervention required. Will continue to follow acutely to maximize functional mobility independence and safety.    Follow Up Recommendations  Outpatient PT      Equipment Recommendations  None recommended by PT    Recommendations for Other Services       Precautions / Restrictions Precautions Precautions: Fall Restrictions Weight Bearing Restrictions: No    Mobility  Bed Mobility Overal bed mobility: Modified Independent                Transfers Overall transfer level: Needs assistance Equipment used: None Transfers: Sit to/from Stand Sit to Stand: Supervision         General transfer comment: Supervision for safety.   Ambulation/Gait Ambulation/Gait assistance: Supervision;Min guard Gait Distance (Feet): 300 Feet Assistive device: IV Pole Gait Pattern/deviations: Step-through pattern;Decreased stride length Gait velocity: Decreased   General Gait Details: Pt with improved steadiness this session. Able to perform dynamic gait tasks with no LOB and did not see notable drift this session.    Stairs Stairs: Yes Stairs assistance: Min guard Stair Management: One rail Right;Alternating pattern;Forwards Number of Stairs: 4 General stair comments: Overall steady stair navigation.  Min guard for safety. No LOB noted.    Wheelchair Mobility    Modified Rankin (Stroke Patients Only) Modified Rankin (Stroke Patients Only) Pre-Morbid Rankin Score: No symptoms Modified Rankin: Moderately severe disability     Balance Overall balance assessment: Needs assistance Sitting-balance support: No upper extremity supported;Feet supported Sitting balance-Leahy Scale: Good     Standing balance support: No upper extremity supported;During functional activity Standing balance-Leahy Scale: Fair                   Standardized Balance Assessment Standardized Balance Assessment : Dynamic Gait Index   Dynamic Gait Index Level Surface: Normal Gait with Horizontal Head Turns: Mild Impairment Gait with Vertical Head Turns: Normal Gait and Pivot Turn: Normal Steps: Mild Impairment      Cognition Arousal/Alertness: Awake/alert Behavior During Therapy: WFL for tasks assessed/performed Overall Cognitive Status: Within Functional Limits for tasks assessed                                        Exercises      General Comments        Pertinent Vitals/Pain Pain Assessment: No/denies pain    Home Living                      Prior Function            PT Goals (current goals can now be found in the care plan section) Acute Rehab PT Goals Patient Stated Goal: to go home  PT Goal Formulation: With patient Time  For Goal Achievement: 11/05/18 Potential to Achieve Goals: Good Progress towards PT goals: Goals met/education completed, patient discharged from PT    Frequency    Min 4X/week      PT Plan Current plan remains appropriate    Co-evaluation              AM-PAC PT "6 Clicks" Mobility   Outcome Measure  Help needed turning from your back to your side while in a flat bed without using bedrails?: None Help needed moving from lying on your back to sitting on the side of a flat bed without using bedrails?: None Help  needed moving to and from a bed to a chair (including a wheelchair)?: None Help needed standing up from a chair using your arms (e.g., wheelchair or bedside chair)?: None Help needed to walk in hospital room?: A Little Help needed climbing 3-5 steps with a railing? : A Little 6 Click Score: 22    End of Session Equipment Utilized During Treatment: Gait belt Activity Tolerance: Patient tolerated treatment well Patient left: in chair;with call bell/phone within reach;with chair alarm set Nurse Communication: Mobility status PT Visit Diagnosis: Unsteadiness on feet (R26.81)     Time: 1140-1155 PT Time Calculation (min) (ACUTE ONLY): 15 min  Charges:  $Gait Training: 8-22 mins                     Leighton Ruff, PT, DPT  Acute Rehabilitation Services  Pager: [phone] Office: [phone]    Rudean Hitt 11/05/2018, 3:05 PM

## 2018-11-05 NOTE — Progress Notes (Addendum)
Initial Nutrition Assessment  DOCUMENTATION CODES:   Non-severe (moderate) malnutrition in context of chronic illness, Underweight  INTERVENTION:  Provide Ensure Enlive po TID, each supplement provides 350 kcal and 20 grams of protein.  Encourage adequate PO intake.   NUTRITION DIAGNOSIS:   Moderate Malnutrition related to chronic illness(prostate cancer) as evidenced by moderate fat depletion, moderate muscle depletion.  GOAL:   Patient will meet greater than or equal to 90% of their needs  MONITOR:   PO intake, Supplement acceptance, Skin, Weight trends, Labs, I & O's  REASON FOR ASSESSMENT:   Malnutrition Screening Tool    ASSESSMENT:   58 year old male with history of coronary artery disease status post cardiac cath with angioplasty, ischemic cardiomyopathy with ejection fraction of 35 to 40%, hypertension, tobacco use, hyperlipidemia who presented with complaints of left arm tingling, weakness, chest pain. Imaging suggested acute ischemic stroke, possible clot in the distal ascending aorta and also possible left ventricle. Imagings also suggested  metastatic prostate cancer.  Meal completion has been 75%. Pt reports appetite has been fine currently and PTA with usual consumption of at least 3 meals a day. Pt also reports consuming Ensure at home 3-4 times daily. Usual body weight reported to be ~145 lbs. Pt with a 10% weight loss in 4 months per weight records, significant for time frame. Pt currently has Ensure ordered and has been consuming them. RD to increase Ensure to TID to aid in caloric and protein needs.  Labs and medications reviewed.   NUTRITION - FOCUSED PHYSICAL EXAM:    Most Recent Value  Orbital Region  Unable to assess  Thoracic and Lumbar Region  Moderate depletion  Buccal Region  Unable to assess  Temple Region  Unable to assess  Clavicle Bone Region  Moderate depletion  Clavicle and Acromion Bone Region  Moderate depletion  Scapular Bone Region   Moderate depletion  Dorsal Hand  Unable to assess  Patellar Region  Moderate depletion  Anterior Thigh Region  Moderate depletion  Posterior Calf Region  Moderate depletion  Edema (RD Assessment)  Unable to assess  Hair  Reviewed  Eyes  Reviewed  Mouth  Reviewed  Skin  Reviewed  Nails  Reviewed       Diet Order:   Diet Order            Diet regular Room service appropriate? Yes; Fluid consistency: Thin  Diet effective now              EDUCATION NEEDS:   Not appropriate for education at this time  Skin:  Skin Assessment: Reviewed RN Assessment  Last BM:  8/26  Height:   Ht Readings from Last 1 Encounters:  11/03/18 6\' 1"  (1.854 m)    Weight:   Wt Readings from Last 1 Encounters:  11/03/18 59.4 kg    Ideal Body Weight:  83.6 kg  BMI:  Body mass index is 17.28 kg/m.  Estimated Nutritional Needs:   Kcal:  2000-2200  Protein:  100-110 grams  Fluid:  >/= 2 L/day    Corrin Parker, MS, RD, LDN Pager # [phone] After hours/ weekend pager # [phone]

## 2018-11-05 NOTE — Progress Notes (Signed)
ANTICOAGULATION CONSULT NOTE - Follow-Up Consult  Pharmacy Consult for Heparin Indication: thrombus and CVA  No Known Allergies  Patient Measurements: Height: 6\' 1"  (185.4 cm) Weight: 131 lb (59.4 kg) IBW/kg (Calculated) : 79.9 Heparin Dosing Weight: 59.4 kg  Vital Signs: Temp: 99 F (37.2 C) (08/28 1551) Temp Source: Oral (08/28 1551) BP: 137/91 (08/28 1551) Pulse Rate: 90 (08/28 1551)  Labs: Recent Labs    11/03/18 1140 11/03/18 1325 11/03/18 1535 11/03/18 1857 11/04/18 0354  11/04/18 2054 11/05/18 0454 11/05/18 1140 11/05/18 1536  HGB 10.6*  --   --   --  10.0*  --   --  10.2*  --   --   HCT 32.6*  --   --   --  30.9*  --   --  30.2*  --   --   PLT 467*  --   --   --  488*  --   --  420*  --   --   APTT 27  --   --   --   --   --   --   --   --   --   LABPROT 13.0  --   --   --   --   --   --   --  14.1  --   INR 1.0  --   --   --   --   --   --   --  1.1  --   HEPARINUNFRC  --   --   --   --  <0.10*   < > 0.14* 0.24*  --  0.58  CREATININE 0.77  --   --   --   --   --   --   --   --   --   TROPONINIHS 53* 46* 73* 67*  --   --   --   --   --   --    < > = values in this interval not displayed.    Estimated Creatinine Clearance: 84.6 mL/min (by C-G formula based on SCr of 0.77 mg/dL).   Medical History: Past Medical History:  Diagnosis Date  . Abnormal chest CT 02/2018   a. Ectatic 3 cm Ao arch - rec f/u outpt imaging w/ CTA or MRA. Ectatic atheromatous abd Ao @ risk for aneurysm - rec f/u u/s in 5 yrs. Marked prostatic enlargement w/ scattered small sclerotic foci in the thoracic lower lumbar spine, sacrum, left 12th rib, pelvis, and right femur.  Recommend elective outpatient whole-body bone scintigraphy and PSA.  Marland Kitchen CAD (coronary artery disease)    a. 02/2018 ACS/PCI: LM nl, LAD 85p (3.5x15 [place] DES), D1 45ost, RI 55ost, RCA nl, EF 25-35%.  . Complete left bundle branch block (LBBB) 2016  . Current every day smoker   . HFrEF (heart failure with reduced  ejection fraction) ([place])   . Hypertension   . Ischemic cardiomyopathy 02/2018   a. 02/2018 Echo: EF 35-40% (LV gram on cath was 25-30%), mod, diff HK. mid-apicalanteroseptal, ant, and apical sev HK. RVH.  Marland Kitchen Myocardial infarction ([place])   . Stroke ([place])   . TIA (transient ischemic attack) 2016  . Warthin's tumor     Assessment: 44 yoM transferred from [facility] with CVA for CTA work up. 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 No tPa was given. No AC PTA. Kary Kos is currently held. Pharmacy consulted to dose heparin and Warfarin. No surgery planned.  CBC low, Hgb 10.6. Plt 467.  MRI brain with small right cerebral infarcts. Per neurology, small size of acute strokes, low likelihood of hemorrhagic transformation.  Started heparin drip without bolus stroke protocol. Heparin level is therapeutic but above lower goal range at 0.58, no S/Sx bleeding per nursing.   Goal of Therapy:  INR 2-3 Heparin level 0.3-0.7 units/ml Monitor platelets by anticoagulation protocol: Yes   Plan:  Reduce heparin to 1500 units/hr Recheck heparin level in [place], PharmD, BCPS Clinical Pharmacist [phone] Please check AMION for all [place] numbers 11/05/2018

## 2018-11-05 NOTE — Progress Notes (Addendum)
Progress Note  Patient Name: Daniel Weiss Date of Encounter: 11/05/2018  Primary Cardiologist: Daniel Hew, MD   Subjective   Very tearful this AM given his grim news. Denies chest pain. Feeling overwhelmed.   Inpatient Medications    Scheduled Meds: . atorvastatin  80 mg Oral Once  . clopidogrel  75 mg Oral Daily  . feeding supplement (ENSURE ENLIVE)  237 mL Oral BID BM  . nicotine  21 mg Transdermal Daily  . tamsulosin  0.4 mg Oral QPC supper   Continuous Infusions: . sodium chloride 75 mL/hr at 11/05/18 0601  . heparin 1,600 Units/hr (11/05/18 0601)   PRN Meds: acetaminophen **OR** acetaminophen (TYLENOL) oral liquid 160 mg/5 mL **OR** acetaminophen, senna-docusate   Vital Signs    Vitals:   11/04/18 1949 11/04/18 2337 11/05/18 0326 11/05/18 0800  BP: (!) 146/98 (!) 124/93 130/88 131/86  Pulse: 92 84 86 78  Resp: 18 19 18 18   Temp: 98.9 F (37.2 C) 98.6 F (37 C) 98.6 F (37 C) 98.8 F (37.1 C)  TempSrc: Oral Oral Oral Oral  SpO2: 100% 100% 100% 99%  Weight:      Height:        Intake/Output Summary (Last 24 hours) at 11/05/2018 0828 Last data filed at 11/05/2018 0700 Gross per 24 hour  Intake 2387.87 ml  Output 1650 ml  Net 737.87 ml   Filed Weights   11/03/18 1121  Weight: 59.4 kg    Physical Exam   General: Frail, NAD Neck: Negative for carotid bruits. No JVD Lungs:Clear to ausculation bilaterally. No wheezes, rales, or rhonchi. Breathing is unlabored. Cardiovascular: RRR with S1 S2. No murmurs Extremities: No edema. No clubbing or cyanosis. DP pulses 2+ bilaterally Neuro: Alert and oriented. No focal deficits. No facial asymmetry. MAE spontaneously. Psych: Responds to questions appropriately with normal affect.    Labs    Chemistry Recent Labs  Lab 11/03/18 1140  NA 134*  K 3.8  CL 100  CO2 24  GLUCOSE 107*  BUN 5*  CREATININE 0.77  CALCIUM 8.6*  PROT 6.6  ALBUMIN 2.9*  AST 16  ALT 9  ALKPHOS 84  BILITOT 0.4   GFRNONAA >60  GFRAA >60  ANIONGAP 10     Hematology Recent Labs  Lab 11/03/18 1140 11/04/18 0354 11/05/18 0454  WBC 8.0 6.3 6.4  RBC 4.02* 3.78* 3.82*  HGB 10.6* 10.0* 10.2*  HCT 32.6* 30.9* 30.2*  MCV 81.1 81.7 79.1*  MCH 26.4 26.5 26.7  MCHC 32.5 32.4 33.8  RDW 16.8* 16.7* 16.3*  PLT 467* 488* 420*    Cardiac EnzymesNo results for input(s): TROPONINI in the last 168 hours. No results for input(s): TROPIPOC in the last 168 hours.   BNPNo results for input(s): BNP, PROBNP in the last 168 hours.   DDimer No results for input(s): DDIMER in the last 168 hours.   Radiology  Radiology studies reviewed.  Not included in note because of extensive reporting.    Telemetry    11/05/2018 NSR HR 80-90's - Personally Reviewed  ECG     No new tracing since 11/03/2018- Personally Reviewed  Cardiac Studies   ECHO: 11/04/2018: Stable EF 35 to 40%.  Moderately reduced.  Severe HK of mid apical anteroseptal and anterior wall.  Mild dyskinesia of apical segment.  Paradoxical septal motion due to LBBB.  No significant change from previous study.  No LV thrombus noted.   ECHO: 08/16/2018: EF 35-40%.  Apical anterior-lateral-septal as well as mid  anteroseptal akinesis.  Mildly increased RV pressures.  GR 1 DD.  Mild aortic sclerosis but no stenosis.  Lipomatous IAS    CATH: 03/06/2018: pLAD 85% just after D1 --> DES PCI STENT SIERRA 3.50 X 15 MM (3.9 MM) - of jailed Ost D1 -> 45%.  EF 25-35% (with mid anterior and inferior severe hypokinesis and apical inferior and anterior akinesis -consistent with a recent anterior MI). Ost Ramus lesion is 55% stenosed.  Diagnostic Dominance: Right  Intervention     Patient Profile     58 y.o. male with a hx of DES LAD 02/2018 w/ EF 25% and med rx for mod D1/RCA dz, EF 35-40% echo 02/2018, TIA, LBBB, S-CHF, Warthin's tumor R parotid, tob use, HTN, dyslipidemia, who is being seen today for the evaluation of elevated trop, ?LV thrombus at the  request of Dr Daniel Weiss.  --> Unfortunately, as part of his evaluation found to have metastatic prostate cancer.  Assessment & Plan    1.  Chest pain: -Presented with chest pain that woke him was sharp, unlike his previous angina>>CT on arrival with acute embolic stroke  -His troponin elevation is mild and not consistent with ACS - not c/w demand ischemia -At consultation, CT-A reviewed which showed a patent proximal LAD stent with otherwise non-obstructive 3 vessel CAD. --> There was a thread-like density in the ascending aorta that was thought to be thrombus>>did not appear to be a dissection  -Given this, it was recommended that anticoagulation with warfarin be started wit IV Hep until INR between 2-3.  Stop ticagrelor and load with clopidogrel & change to Clopidogrel.  Stop aspirin.    2.  CAD: s/p DES PCI LAD in Dex 2019 -due to the need for Warfarin, MD advised on transitioning from Brilinta to Plavix with Loading dose yesterday -See plan above   -Continue high-dose statin.  As blood pressure tolerates, would reinitiate carvedilol (probably start @ 3.125 mg twice daily & if tolerated, increase to 6.25 mg BID). ---> Prior to discharge if blood pressure tolerates, would also restart his home dose ACE inhibitor but started him on half of original home dose of 10 mg dose (start @ 5 mg daily)  3.  Ascending aorta thrombus: -Per CT thrombus in the proximal arch/distal ascending aorta noted.  -Also with multiple sclerotic bone lesions and abnormal prostate enhancement which is concerning for prostate cancer with metastatic disease. -Per IM  --> Echo did not suggest presence of LV thrombus, regardless with aortic arch thrombus, would treat similar to LV thrombus.  Metastatic Prostate Ca --> per IM  Otherwise, per IM   Acute cerebrovascular accident (CVA) (Daniel Weiss) Active Problems:   Paresthesia   Essential hypertension   Hyponatremia   Hypokalemia   Tobacco abuse   Coronary artery disease  involving native coronary artery of native heart with angina pectoris (Daniel Weiss)   Hyperlipidemia LDL goal <70   Enlarged prostate   Ischemic cardiomyopathy    Signed, Daniel Drown NP-C [facility] Pager: [phone] 11/05/2018, 8:28 AM    ATTENDING ATTESTATION  I have seen, examined and evaluated the patient this PM along with Daniel Drown, NP-C.  After reviewing all the available data and chart, we discussed the patients laboratory, study & physical findings as well as symptoms in detail. I agree with her findings, examination as well as impression recommendations as per our discussion.    Attending adjustments noted in italics.    [facility] will sign off.   Medication Recommendations:  As noted above  Other recommendations (labs, testing, etc):  n/a Follow up as an outpatient:  Will arrange OP f/u with Dr. Ellyn Hack in next 3-4 weeks.    Daniel Weiss, M.D., M.S. Interventional Cardiologist   Pager # [phone] Phone # [phone] [address] [place], [place] [address]     For questions or updates, please contact   Please consult [email or web address] for contact info under Cardiology/STEMI.

## 2018-11-05 NOTE — Progress Notes (Signed)
PROGRESS NOTE    Daniel Weiss  [id number] DOB: [birth year] DOA: 11/03/2018 PCP: Patient, No Pcp Per   Brief Narrative:  Patient is a 58 year old male with history of coronary artery disease status post cardiac cath with angioplasty, ischemic cardiomyopathy with ejection fraction of 35 to 40%, hypertension, tobacco use, hyperlipidemia who presented at [facility] with complaints of left arm tingling, weakness, chest pain.  Imaging suggested acute ischemic stroke, possible clot in the distal ascending aorta and also possible left ventricle. CT coronary showed non-obstructing plaque in all coronary distributions,intimal irregularity with filling defect in the ascending aorta but no LV thrombus. Imagings also suggested  metastatic prostate cancer. Patient transferred to [facility].  Currently on heparin drip. Also started on warfarin. Neurology, cardiology, urology following.  Assessment & Plan:   Principal Problem:   Acute cerebrovascular accident (CVA) ([employer]) Active Problems:   Paresthesia   Essential hypertension   Hyponatremia   Hypokalemia   Tobacco abuse   Coronary artery disease involving native coronary artery of native heart with angina pectoris (HCC)   Hyperlipidemia LDL goal <70   Enlarged prostate   Ischemic cardiomyopathy   Acute cardioembolic stroke ([place])   Acute CVA: MRI showed right-sided frontoparietal infarcts.  Picture suggested embolic nature.  Imaging showed thrombus in the ascending aorta . Neurology was following.  Continue plavix, statin,coumadin. Currently does not have any focal neurological deficits.  He presented with left-sided weakness and tingling/numbness. PT/OT evaluation done.No follow up recommended. HbA1c of 5.3. LDL of 67.  Ascending aorta thrombus: Currently on heparin drip.  Also started on warfarin.Continue bridging until INR between 2-3 .CT coronary showed non-obstructing plaque in all coronary distributions,intimal irregularity with filling  defect in the ascending aorta but no LV thrombus.  Coronary artery disease: Denies any chest pain at present.  Status post LAD stenting.  Was on aspirin and Brilinta at home.  Currently on Plavix.  Systolic CHF: Echocardiogram showed ejection fraction of 35 to 40%, impaired left ventricular relaxation.    Currently he is euvolemic  Prostate cancer with osseous metastasis: Incidental finding on CT scan.   Very high PSA.  Imagings also showed  bone metastasis.  Undergoing bone scan today.  Start on tamsulosin.  Urology following  Hyperlipidemia: Continue statin  Hypertension: Currently blood stable.  Continue current medicines  Tobacco abuse: Counseled cessation.  Nicotine patch ordered.  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  Discussed with patient .  Patient communicating with family. Disposition Plan: Home after INR is between 2-3   Consultants: Cardiology, neurology, urology  Procedures: MRI  Antimicrobials:  Anti-infectives (From admission, onward)   None      Subjective:  Patient seen and examined the bedside this morning.  Currently hemodynamically stable.  Comfortable.  No numbness or weakness on left side.  Denies any complaints.   Objective: Vitals:   11/04/18 1949 11/04/18 2337 11/05/18 0326 11/05/18 0800  BP: (!) 146/98 (!) 124/93 130/88 131/86  Pulse: 92 84 86 78  Resp: 18 19 18 18   Temp: 98.9 F (37.2 C) 98.6 F (37 C) 98.6 F (37 C) 98.8 F (37.1 C)  TempSrc: Oral Oral Oral Oral  SpO2: 100% 100% 100% 99%  Weight:      Height:        Intake/Output Summary (Last 24 hours)  at 11/05/2018 1132 Last data filed at 11/05/2018 0700 Gross per 24 hour  Intake 2387.87 ml  Output 1650 ml  Net 737.87 ml   Filed Weights   11/03/18 1121  Weight: 59.4 kg     Examination:  General exam: Appears calm and comfortable ,Not in distress,average built HEENT:PERRL,Oral mucosa moist, Ear/Nose normal on gross exam Respiratory system: Bilateral equal air entry, normal vesicular breath sounds, no wheezes or crackles  Cardiovascular system: S1 & S2 heard, RRR. No JVD, murmurs, rubs, gallops or clicks. Gastrointestinal system: Abdomen is nondistended, soft and nontender. No organomegaly or masses felt. Normal bowel sounds heard. Central nervous system: Alert and oriented. No focal neurological deficits. Extremities: No edema, no clubbing ,no cyanosis, distal peripheral pulses palpable. Skin: No rashes, lesions or ulcers,no icterus ,no pallor MSK: Normal muscle bulk,tone ,power   Data Reviewed: I have personally reviewed following labs and imaging studies  CBC: Recent Labs  Lab 11/03/18 1140 11/04/18 0354 11/05/18 0454  WBC 8.0 6.3 6.4  NEUTROABS 4.7  --   --   HGB 10.6* 10.0* 10.2*  HCT 32.6* 30.9* 30.2*  MCV 81.1 81.7 79.1*  PLT 467* 488* 0000000*   Basic Metabolic Panel: Recent Labs  Lab 11/03/18 1140  NA 134*  K 3.8  CL 100  CO2 24  GLUCOSE 107*  BUN 5*  CREATININE 0.77  CALCIUM 8.6*   GFR: Estimated Creatinine Clearance: 84.6 mL/min (by C-G formula based on SCr of 0.77 mg/dL). Liver Function Tests: Recent Labs  Lab 11/03/18 1140  AST 16  ALT 9  ALKPHOS 84  BILITOT 0.4  PROT 6.6  ALBUMIN 2.9*   No results for input(s): LIPASE, AMYLASE in the last 168 hours. No results for input(s): AMMONIA in the last 168 hours. Coagulation Profile: Recent Labs  Lab 11/03/18 1140  INR 1.0   Cardiac Enzymes: No results for input(s): CKTOTAL, CKMB, CKMBINDEX, TROPONINI in the last 168 hours. BNP (last 3 results) No results for input(s): PROBNP in the last 8760 hours. HbA1C: Recent Labs    11/04/18 0354  HGBA1C 5.3   CBG: Recent Labs  Lab 11/03/18 1123  GLUCAP 114*   Lipid Profile: Recent Labs    11/04/18 0354  CHOL  118  HDL 37*  LDLCALC 67  TRIG 70  CHOLHDL 3.2   Thyroid Function Tests: No results for input(s): TSH, T4TOTAL, FREET4, T3FREE, THYROIDAB in the last 72 hours. Anemia Panel: No results for input(s): VITAMINB12, FOLATE, FERRITIN, TIBC, IRON, RETICCTPCT in the last 72 hours. Sepsis Labs: No results for input(s): PROCALCITON, LATICACIDVEN in the last 168 hours.  Recent Results (from the past 240 hour(s))  SARS CORONAVIRUS 2 (TAT 6-12 HRS) Nasal Swab Aptima Multi Swab     Status: None   Collection Time: 11/03/18 11:57 AM   Specimen: Aptima Multi Swab; Nasal Swab  Result Value Ref Range Status   SARS Coronavirus 2 NEGATIVE NEGATIVE Final    Comment: (NOTE) SARS-CoV-2 target nucleic acids are NOT DETECTED. The SARS-CoV-2 RNA is generally detectable in upper and lower respiratory specimens during the acute phase of infection. Negative results do not preclude SARS-CoV-2 infection, do not rule out co-infections with other pathogens, and should not be used as the sole basis for treatment or other patient management decisions. Negative results must be combined with clinical observations, patient history, and epidemiological information. The expected result is Negative. Fact Sheet for Patients: SugarRoll.be Fact Sheet for Healthcare Providers: https://www.woods-mathews.com/ This test is not yet approved or cleared by the [place]  States FDA and  has been authorized for detection and/or diagnosis of SARS-CoV-2 by FDA under an Emergency Use Authorization (EUA). This EUA will remain  in effect (meaning this test can be used) for the duration of the COVID-19 declaration under Section 56 4(b)(1) of the Act, 21 U.S.C. section 360bbb-3(b)(1), unless the authorization is terminated or revoked sooner. Performed at [facility], [place] [address] [place], [place] [address]          Radiology Studies: Ct Angio Head W Or Wo Contrast  Result Date:  11/03/2018 CLINICAL DATA:  Acute onset of chest pain today. Numbness of the left arm and hand. EXAM: CT ANGIOGRAPHY HEAD AND NECK TECHNIQUE: Multidetector CT imaging of the head and neck was performed using the standard protocol during bolus administration of intravenous contrast. Multiplanar CT image reconstructions and MIPs were obtained to evaluate the vascular anatomy. Carotid stenosis measurements (when applicable) are obtained utilizing NASCET criteria, using the distal internal carotid diameter as the denominator. CONTRAST:  162mL OMNIPAQUE IOHEXOL 350 MG/ML SOLN COMPARISON:  Head CT earlier same day FINDINGS: CTA NECK FINDINGS Aortic arch: Arch is negative. Branching pattern is normal without stenosis. Right carotid system: Common carotid artery widely patent to the bifurcation. No carotid bifurcation stenosis or irregularity. Minimal plaque in the ICA bulb. Left carotid system: Common carotid artery widely patent to the bifurcation. Mild atherosclerotic plaque at the carotid bifurcation and proximal external carotid. No internal carotid stenosis or irregularity. Vertebral arteries: Both vertebral artery origins are patent. Mild atherosclerotic plaque on the right but no flow limiting stenosis. On the left, there is 50% stenosis due to focal calcified plaque. Both vertebral arteries are patent through the cervical region to the foramen magnum. Skeleton: Degenerative cervical spondylosis. Prominent osteophytes/disc herniations at C4-5 and C5-6 that could cause symptomatic stenosis of the canal and or foramina. Other neck: Large right parotid mass involving both the deep lobe and superficial lobe extending inferior from the superficial lobe. This has been seen on multiple previous examinations and is not changing, consistent with benign nature. Axial measurements are unchanged since 2017 at 32 x 43 mm. Upper chest: Negative Review of the MIP images confirms the above findings CTA HEAD FINDINGS Anterior  circulation: Both internal carotid arteries are patent through the skull base and siphon regions. The anterior and middle cerebral vessels are patent without proximal stenosis, aneurysm or vascular malformation. No embolic occlusions are identified. Posterior circulation: Both vertebral arteries are widely patent through the foramen magnum to the basilar. The right is dominant. No basilar stenosis. Posterior circulation branch vessels are patent. Patent posterior communicating arteries on each side. Venous sinuses: Patent and normal. Anatomic variants: None significant. Review of the MIP images confirms the above findings IMPRESSION: No acute vascular finding to explain the clinical presentation. No sign of dissection or occlusion of the brachiocephalic or intracranial vessels. Mild atherosclerotic plaque as above but without flow limiting stenosis in the anterior circulation vessels. Posterior circulation vessels are patent. Atherosclerotic disease at both vertebral artery origins without measurable stenosis on the right and with focal 50% stenosis on the left. 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 Advanced cervical degenerative disease with spinal stenosis and foraminal stenosis at C4-5 and C5-6 that could be significant. Electronically Signed   By: Nelson Chimes M.D.   On: 11/03/2018 13:40   Ct Angio Neck W And/or Wo Contrast  Result Date: 11/03/2018 CLINICAL DATA:  Acute onset  of chest pain today. Numbness of the left arm and hand. EXAM: CT ANGIOGRAPHY HEAD AND NECK TECHNIQUE: Multidetector CT imaging of the head and neck was performed using the standard protocol during bolus administration of intravenous contrast. Multiplanar CT image reconstructions and MIPs were obtained to evaluate the vascular anatomy. Carotid stenosis measurements (when applicable) are obtained utilizing NASCET criteria, using the distal internal  carotid diameter as the denominator. CONTRAST:  174mL OMNIPAQUE IOHEXOL 350 MG/ML SOLN COMPARISON:  Head CT earlier same day FINDINGS: CTA NECK FINDINGS Aortic arch: Arch is negative. Branching pattern is normal without stenosis. Right carotid system: Common carotid artery widely patent to the bifurcation. No carotid bifurcation stenosis or irregularity. Minimal plaque in the ICA bulb. Left carotid system: Common carotid artery widely patent to the bifurcation. Mild atherosclerotic plaque at the carotid bifurcation and proximal external carotid. No internal carotid stenosis or irregularity. Vertebral arteries: Both vertebral artery origins are patent. Mild atherosclerotic plaque on the right but no flow limiting stenosis. On the left, there is 50% stenosis due to focal calcified plaque. Both vertebral arteries are patent through the cervical region to the foramen magnum. Skeleton: Degenerative cervical spondylosis. Prominent osteophytes/disc herniations at C4-5 and C5-6 that could cause symptomatic stenosis of the canal and or foramina. Other neck: Large right parotid mass involving both the deep lobe and superficial lobe extending inferior from the superficial lobe. This has been seen on multiple previous examinations and is not changing, consistent with benign nature. Axial measurements are unchanged since 2017 at 32 x 43 mm. Upper chest: Negative Review of the MIP images confirms the above findings CTA HEAD FINDINGS Anterior circulation: Both internal carotid arteries are patent through the skull base and siphon regions. The anterior and middle cerebral vessels are patent without proximal stenosis, aneurysm or vascular malformation. No embolic occlusions are identified. Posterior circulation: Both vertebral arteries are widely patent through the foramen magnum to the basilar. The right is dominant. No basilar stenosis. Posterior circulation branch vessels are patent. Patent posterior communicating arteries on  each side. Venous sinuses: Patent and normal. Anatomic variants: None significant. Review of the MIP images confirms the above findings IMPRESSION: No acute vascular finding to explain the clinical presentation. No sign of dissection or occlusion of the brachiocephalic or intracranial vessels. Mild atherosclerotic plaque as above but without flow limiting stenosis in the anterior circulation vessels. Posterior circulation vessels are patent. Atherosclerotic disease at both vertebral artery origins without measurable stenosis on the right and with focal 50% stenosis on the left. 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 Advanced cervical degenerative disease with spinal stenosis and foraminal stenosis at C4-5 and C5-6 that could be significant. Electronically Signed   By: Nelson Chimes M.D.   On: 11/03/2018 13:40   Mr Brain Wo Contrast  Result Date: 11/03/2018 CLINICAL DATA:  Left upper extremity weakness/numbness and chest pain. EXAM: MRI HEAD WITHOUT CONTRAST TECHNIQUE: Multiplanar, multiecho pulse sequences of the brain and surrounding structures were obtained without intravenous contrast. COMPARISON:  Head CT 11/03/2018 and MRI 06/30/2015 FINDINGS: Brain: A few subcentimeter acute infarcts are present in the posterior right frontal and right parietal lobes involving cortex and subcortical white matter. Small foci of T2 hyperintensity in the cerebral white matter bilaterally have mildly progressed from 2017 and are nonspecific but compatible with mildly age advanced chronic small vessel ischemic disease. The ventricles and sulci are within normal limits for age. Vascular: Major intracranial vascular flow  voids are preserved. Skull and upper cervical spine: Unremarkable bone marrow signal. Sinuses/Orbits: Unremarkable orbits. Minimal scattered mucosal thickening in the paranasal sinuses. Clear mastoid air cells. Other: Partially  visualized large right parotid region mass as seen on multiple prior examinations. IMPRESSION: 1. Small acute right frontoparietal infarcts. 2. Mild chronic small vessel ischemic disease, mildly progressed from 2017. Electronically Signed   By: Logan Bores M.D.   On: 11/03/2018 18:37   Ct Coronary Morph W/cta Cor W/score W/ca W/cm &/or Wo/cm  Addendum Date: 11/04/2018   ADDENDUM REPORT: 11/04/2018 12:42 CLINICAL DATA:  20M with CAD s/p LAD PCI, chronic systolic and diastolic heart failure, LBBB, hypertension, tobacco abuse and hyperlipidemia admitted with acute embolic stroke and peripheral emboli. EXAM: Cardiac/Coronary  CT TECHNIQUE: The patient was scanned on a [employer]. FINDINGS: A 120 kV prospective scan was triggered in the descending thoracic aorta at 111 HU's. Axial non-contrast 3 mm slices were carried out through the heart. The data set was analyzed on a dedicated work station and scored using the [place]. Gantry rotation speed was 250 msecs and collimation was .6 mm. No beta blockade and 0.8 mg of sl NTG was given. The 3D data set was reconstructed in 5% intervals of the 67-82 % of the R-R cycle. Diastolic phases were analyzed on a dedicated work station using MPR, MIP and VRT modes. The patient received 80 cc of contrast. Aorta: Normal size. Ascending aorta 3.5 cm. No calcifications. There is an intimal irregularity with a filling defect in the ascending aorta as previously described. Normal variant bovine aortic arch noted. Aortic Valve:  Trileaflet.  No calcifications. Coronary Arteries:  Normal coronary origin.  Right dominance. RCA is a large dominant artery that gives rise to PDA and PLVB. There is diffuse minimal (<25%) to mild (25-49%) calcified plaque. Left main is a large artery that gives rise to LAD and LCX arteries. There is minimal (<25%) calcified plaque. LAD is a large vessel. Proximal LAD stent is patent. The proximal LAD is heavily calcified but there is minimal  obstruction. There is minimal mixed plaque in the mid to apical LAD with associated positive remodeling. There is mild (25-49%) calcified plaque in proximal D1. LCX is a non-dominant artery. There is moderate calcified plaque in the proximal to mid LCX. RI has mild (25-49%) calcified plaque in the ostium and proximal regions. Other findings: Normal pulmonary vein drainage into the left atrium. Normal let atrial appendage without a thrombus. Normal size of the pulmonary artery. IMPRESSION: 1. Normal coronary origin with right dominance. 2. Patent proximal LAD stent. 3.  Non-obstructing plaque in all coronary distributions. 4. Intimal irregularity with filling defect in the ascending aorta as noted by Radiology. 5.  No left ventricular thrombus noted. Skeet Latch, MD Electronically Signed   By: Skeet Latch   On: 11/04/2018 12:42   Result Date: 11/04/2018 EXAM: OVER-READ INTERPRETATION  CT CHEST The following report is an over-read performed by radiologist Dr. Rolm Baptise of [facility], [place] on 11/03/2018. This over-read does not include interpretation of cardiac or coronary anatomy or pathology. The coronary CTA interpretation by the cardiologist is attached. COMPARISON:  CTA chest earlier today FINDINGS: Vascular: There is intimal irregularity with filling defect extending into the lumen of the distal ascending thoracic aorta near the proximal aortic arch. This is stable since study earlier today. No evidence of thoracic aortic aneurysm. Mediastinum/Nodes: No mediastinal, hilar, or axillary adenopathy. Lungs/Pleura: Lungs clear.  No effusions. Upper Abdomen: Imaging into the  upper abdomen shows no acute findings. Musculoskeletal: Scattered sclerotic densities seen in the thoracic spine as described on prior CT. IMPRESSION: Intimal irregularity with luminal filling defect in the proximal arch/distal ascending thoracic aorta, stable since prior study. Electronically Signed: By: Rolm Baptise M.D. On:  11/03/2018 17:07   Dg Chest Portable 1 View  Result Date: 11/03/2018 CLINICAL DATA:  Pt states he woke up with chest pain this morning. He took 1 nitroglycerin which relieved the pain. One hour ( between 1030-1045) ago he reports numbness to L lower arm and hand., hx of TIA, HTN, no other complaints EXAM: PORTABLE CHEST 1 VIEW COMPARISON:  Chest radiographs dated 12/28 2019, 06/29/2015 FINDINGS: The heart size and mediastinal contours are within normal limits. The lungs are clear. No pneumothorax or pleural effusion. No acute abnormality in the visualized skeleton. IMPRESSION: Normal chest radiograph. Electronically Signed   By: Audie Pinto M.D.   On: 11/03/2018 11:44   Ct Angio Chest/abd/pel For Dissection W And/or Wo Contrast  Result Date: 11/03/2018 CLINICAL DATA:  58 year old male with acute chest and back pain as well as stroke-like symptoms concerning for dissection. EXAM: CT ANGIOGRAPHY CHEST, ABDOMEN AND PELVIS TECHNIQUE: Multidetector CT imaging through the chest, abdomen and pelvis was performed using the standard protocol during bolus administration of intravenous contrast. Multiplanar reconstructed images and MIPs were obtained and reviewed to evaluate the vascular anatomy. CONTRAST:  17mL OMNIPAQUE IOHEXOL 350 MG/ML SOLN COMPARISON:  Prior CTA of the chest abdomen and pelvis 03/06/2018 FINDINGS: CTA CHEST FINDINGS Cardiovascular: 2 vessel aortic arch. The right brachiocephalic and left common carotid artery share a common origin. On the unenhanced images, there is no evidence of intramural high attenuation to suggest intramural hematoma. Following administration of intravenous contrast, there is very subtle irregularity of the intima in the ascending thoracic aorta just proximal to the aortic arch. There is a linear irregular filling defect extending into the aortic lumen measuring approximately 1.4 cm in length by 0.2 mm in width. This almost certainly represents an area of linear  thrombus adherent to the aortic wall. No evidence of aortic wall thickening, stranding in the mediastinal fat or hemorrhage. The remainder of the aortic arch and descending thoracic aorta are normal. The aortic root is normal. The heart is normal in size. Calcifications are present along the left main, left anterior descending, circumflex and right coronary artery. No pericardial effusion. Mediastinum/Nodes: Unremarkable CT appearance of the thyroid gland. No suspicious mediastinal or hilar adenopathy. No soft tissue mediastinal mass. The thoracic esophagus is unremarkable. Lungs/Pleura: Lungs are clear. No pleural effusion or pneumothorax. Musculoskeletal: No acute fracture or aggressive appearing lytic or blastic osseous lesion. Small low-attenuation lesion just deep to the skin surface on the right anterior chest wall just above the nipple line likely represents a sebaceous or other benign cutaneous cyst. Review of the MIP images confirms the above findings. CTA ABDOMEN AND PELVIS FINDINGS VASCULAR Aorta: The aorta is normal in caliber without evidence of aneurysm. Compared to the prior study from December of 2019, there has been significant interval progression of fibrofatty atherosclerotic plaque which is become irregular and ulcerated beginning at the aortic hiatus and extending through the visceral, renal and infrarenal segments of the aorta. Just below the renal arteries, the lumen is narrowed by approximately 70%. Celiac: Patent without evidence of aneurysm, dissection, vasculitis or significant stenosis. SMA: Patent without evidence of aneurysm, dissection, vasculitis or significant stenosis. Renals: Both renal arteries are patent without evidence of aneurysm, dissection, vasculitis, fibromuscular dysplasia  or significant stenosis. IMA: Patent without evidence of aneurysm, dissection, vasculitis or significant stenosis. Inflow: Heterogeneous calcified and noncalcified atherosclerotic plaque without  significant stenosis in either common iliac artery. No evidence of dissection. The left internal iliac artery is occluded at the origin but ultimately reconstitutes. The external iliac arteries are patent. 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 Veins: No focal venous abnormality. Review of the MIP images confirms the above findings. NON-VASCULAR Hepatobiliary: Normal morphology. Stable circumscribed low-attenuation lesions which almost certainly represent benign cysts. Gallbladder is unremarkable. No intra or extrahepatic biliary ductal dilatation. Pancreas: Unremarkable. No pancreatic ductal dilatation or surrounding inflammatory changes. Spleen: Normal in size without focal abnormality. Adrenals/Urinary Tract: Normal adrenal glands. No evidence of hydronephrosis, nephrolithiasis or enhancing renal mass. Stomach/Bowel: Stomach is within normal limits. Appendix appears normal. No evidence of bowel wall thickening, distention, or inflammatory changes. Lymphatic: No suspicious lymphadenopathy. Reproductive: Asymmetric enhancement of the right posterolateral prostate gland with patchy enhancement elsewhere. Findings are significantly more conspicuous compared to prior imaging. Other: No abdominal wall hernia or abnormality. No abdominopelvic ascites. Musculoskeletal: Multifocal sclerotic foci present throughout the skeleton. An index lesion in the left hemi sacrum measures slightly larger compared to the prior study at 0.9 cm compared to 0.8 cm previously. A second index lesion in the right transverse process of T1 measures 1.0 cm compared to 0.8 cm previously. Numerous additional lesions are present scattered throughout the thoracic and lumbar spine. These lesions are significantly smaller and not dramatically changed. Lesions are also identified in both iliac bones, bilateral ischial tuberosities, ribs and the right femoral head. The smaller of the 2 femoral head  lesions also appears slightly enlarged. Review of the MIP images confirms the above findings. IMPRESSION: CTA CHEST 1. 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 This is just proximal to the origin of the right brachiocephalic artery which supplies both carotid arteries. This lesion is at high risk for embolization which could cause additional large vessel occlusion in the cerebral vasculature. The intimal irregularity could represent the form fruste of an aortic dissection if the patient has significant hypertension although the abnormality is confined to the intima. There is no evidence of thickening or hemorrhage within the aortic media. 2. Left main and 3 vessel coronary artery disease. CTA ABD/PELVIS 1. No evidence of acute aortic dissection or aneurysm. 2. However, there has been significant interval progression of irregular/ulcerated hypoechoic plaque and likely wall adherent mural thrombus within the visceral, renal and infrarenal abdominal aorta. 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 3. 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 There is irregular patchy enhancement throughout the prostate gland more (more on the right than the left) with numerous multifocal sclerotic bone lesions in the pelvis, right femoral head, lumbar and thoracic spine and ribs. At least a few of the small sclerotic foci have subtly enlarged compared to prior imaging. Recommend correlation with serum PSA and referral to urology. These results were called by telephone at the time of interpretation on 11/03/2018 at 1:11 pm to Dr. Sherwood Gambler , who verbally acknowledged these results. Signed, Criselda Peaches, MD, [facility] Radiology Electronically Signed   By: Myrle Sheng  Laurence Ferrari M.D.   On: 11/03/2018 13:59   Ct Head Code Stroke Wo Contrast  Result Date: 11/03/2018 CLINICAL DATA:  Code stroke.  Left arm numbness EXAM: CT HEAD WITHOUT CONTRAST TECHNIQUE: Contiguous axial images were obtained from the base of the skull through the vertex without intravenous contrast. COMPARISON:  06/30/2015 brain MRI FINDINGS: Brain: No evidence of acute infarction, hemorrhage, hydrocephalus, extra-axial collection or mass lesion/mass effect. Vascular: No hyperdense vessel or unexpected calcification. Skull: Normal. Negative for fracture or focal lesion. Sinuses/Orbits: No acute finding. Other: These results were called by telephone at the time of interpretation on 11/03/2018 at 11:39 am to Dr. Sherwood Gambler , who verbally acknowledged these results. ASPECTS [facility] Stroke Program Early CT Score) - Ganglionic level infarction (caudate, lentiform nuclei, internal capsule, insula, M1-M3 cortex): 7 - Supraganglionic infarction (M4-M6 cortex): 3 Total score (0-10 with 10 being normal): 10 IMPRESSION: Negative head CT.ASPECTS is 10. Electronically Signed   By: Monte Fantasia M.D.   On: 11/03/2018 11:41        Scheduled Meds:  atorvastatin  80 mg Oral Once   clopidogrel  75 mg Oral Daily   feeding supplement (ENSURE ENLIVE)  237 mL Oral BID BM   nicotine  21 mg Transdermal Daily   tamsulosin  0.4 mg Oral QPC supper   Continuous Infusions:  sodium chloride 75 mL/hr at 11/05/18 0601   heparin 1,600 Units/hr (11/05/18 0601)     LOS: 2 days    Time spent: 35 mins.More than 50% of that time was spent in counseling and/or coordination of care.      Shelly Coss, MD [facility] Pager [phone]  If 7PM-7AM, please contact night-coverage [email or web address] Password [facility] 11/05/2018, 11:32 AM

## 2018-11-05 NOTE — Progress Notes (Signed)
  Speech Language Pathology Treatment: Cognitive-Linquistic  Patient Details Name: Daniel Weiss MRN: [id number] DOB: 03/12/1960 Today's Date: 11/05/2018 Time: [id number] SLP Time Calculation (min) (ACUTE ONLY): 27 min  Assessment / Plan / Recommendation Clinical Impression  Pt was seen for cognitive-linguistic treatment and was cooperative throughout the session. He reported that he believes he is doing well with regards to cognition and he was just not paying attention as well as he should have yesterday. Per the pt, he has been told in the past that he tends to hear people and not listen well to what they are actually saying. He demonstrated 70% accuracy with time management problems increasing to 100% with repetition. He achieved 80% accuracy a 3-word opposite mental manipulation task increasing to 100% with min cues. He completed an executive function schedule activity with 90% accuracy increasing to 100% with a single cue. He recalled concrete information from voicemail recordings with 80% accuracy increasing to 90% with min cues. Pt's cognitive-linguistic skills appear within functional limits and skilled SLP services will therefore be discontinued at this time. However, pt has agreed to follow up with his PCP if he notices any difficulty subsequent to his discharge. Pt and his nurse were educated regarding the plan of care and both parties verbalized understanding as well as agreement.    HPI HPI: Pt is a 58 y.o. male with medical history significant of coronary artery disease status post cardiac cath with angioplasty in 2019, ischemic cardiomyopathy, hypertension, tobacco abuse, and hyperlipidemia who went to [facility] complaining of the left arm tingling weakness as well as chest pain and was transferred to [facility] for full stroke work up. MRI of the brain showed small acute right frontoparietal infarcts.      SLP Plan  Continue with current plan of care        Recommendations                   Follow up Recommendations: None SLP Visit Diagnosis: Cognitive communication deficit [id number]) Plan: Continue with current plan of care       Daniel Weiss I. Hardin Negus, [place], [place] Office number [phone] Pager [phone]                Horton Marshall 11/05/2018, 4:49 PM

## 2018-11-05 NOTE — Progress Notes (Signed)
TCTS BRIEF PROGRESS NOTE  Progress and plans noted We will plan f/u CTA chest in 3 months to make sure that thrombus in distal ascending aorta is resolving Please call if we can be of further assistance  Rexene Alberts, MD 11/05/2018 1:40 PM

## 2018-11-05 NOTE — Progress Notes (Signed)
ANTICOAGULATION CONSULT NOTE - Initial Consult  Pharmacy Consult for Heparin and Warfarin Indication: thrombus and CVA  No Known Allergies  Patient Measurements: Height: 6\' 1"  (185.4 cm) Weight: 131 lb (59.4 kg) IBW/kg (Calculated) : 79.9 Heparin Dosing Weight: 59.4 kg  Vital Signs: Temp: 98.7 F (37.1 C) (08/28 1131) Temp Source: Oral (08/28 1131) BP: 133/89 (08/28 1131) Pulse Rate: 101 (08/28 1131)  Labs: Recent Labs    11/03/18 1140 11/03/18 1325 11/03/18 1535 11/03/18 1857  11/04/18 0354 11/04/18 1332 11/04/18 2054 11/05/18 0454  HGB 10.6*  --   --   --   --  10.0*  --   --  10.2*  HCT 32.6*  --   --   --   --  30.9*  --   --  30.2*  PLT 467*  --   --   --   --  488*  --   --  420*  APTT 27  --   --   --   --   --   --   --   --   LABPROT 13.0  --   --   --   --   --   --   --   --   INR 1.0  --   --   --   --   --   --   --   --   HEPARINUNFRC  --   --   --   --    < > <0.10* <0.10* 0.14* 0.24*  CREATININE 0.77  --   --   --   --   --   --   --   --   TROPONINIHS 53* 46* 73* 67*  --   --   --   --   --    < > = values in this interval not displayed.    Estimated Creatinine Clearance: 84.6 mL/min (by C-G formula based on SCr of 0.77 mg/dL).   Medical History: Past Medical History:  Diagnosis Date  . Abnormal chest CT 02/2018   a. Ectatic 3 cm Ao arch - rec f/u outpt imaging w/ CTA or MRA. Ectatic atheromatous abd Ao @ risk for aneurysm - rec f/u u/s in 5 yrs. Marked prostatic enlargement w/ scattered small sclerotic foci in the thoracic lower lumbar spine, sacrum, left 12th rib, pelvis, and right femur.  Recommend elective outpatient whole-body bone scintigraphy and PSA.  Marland Kitchen CAD (coronary artery disease)    a. 02/2018 ACS/PCI: LM nl, LAD 85p (3.5x15 Sierra DES), D1 45ost, RI 55ost, RCA nl, EF 25-35%.  . Complete left bundle branch block (LBBB) 2016  . Current every day smoker   . HFrEF (heart failure with reduced ejection fraction) ([place])   . Hypertension    . Ischemic cardiomyopathy 02/2018   a. 02/2018 Echo: EF 35-40% (LV gram on cath was 25-30%), mod, diff HK. mid-apicalanteroseptal, ant, and apical sev HK. RVH.  Marland Kitchen Myocardial infarction ([place])   . Stroke ([place])   . TIA (transient ischemic attack) 2016  . Warthin's tumor     Assessment: Daniel Weiss transferred from [facility] with CVA for CTA work up. 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 No tPa was given. No AC PTA. Kary Kos is currently held. Pharmacy consulted to dose heparin and Warfarin. No surgery planned.  CBC low, Hgb 10.6. Plt 467. MRI brain with small right cerebral infarcts. Per neurology, small size of acute strokes, low likelihood of hemorrhagic transformation.  Started heparin drip  without bolus stroke protocol.  Will initiate Warfarin tonight.  Goal of Therapy:  INR 2-3 Heparin level 0.3-0.7 units/ml Monitor platelets by anticoagulation protocol: Yes   Plan:  Continue heparin at 1600 units/hr F/u heparin levels @ 1400 today Start Warfarin 7.5 mg x 1 Monitor CBC, Heparin level and INR daily  Alanda Slim, PharmD, [facility] Clinical Pharmacist Please see AMION for all Pharmacists' Contact Phone Numbers 11/05/2018, 12:25 PM

## 2018-11-05 NOTE — Progress Notes (Signed)
Physical Therapy Discharge Patient Details Name: Daniel Weiss MRN: [id number] DOB: 1960/08/10 Today's Date: 11/05/2018 Time: [id number] PT Time Calculation (min) (ACUTE ONLY): 15 min  Patient discharged from PT services secondary to goals met and no further PT needs identified.  Please see latest therapy progress note for current level of functioning and progress toward goals.    Progress and discharge plan discussed with patient and/or caregiver: Patient/Caregiver agrees with plan   Leighton Ruff, PT, DPT  Acute Rehabilitation Services  Pager: [phone] Office: [phone]      Rudean Hitt 11/05/2018, 3:10 PM

## 2018-11-06 LAB — BASIC METABOLIC PANEL
Anion gap: 8 (ref 5–15)
BUN: <5 mg/dL — ABNORMAL LOW (ref 6–20)
CO2: 25 mmol/L (ref 22–32)
Calcium: 8.5 mg/dL — ABNORMAL LOW (ref 8.9–10.3)
Chloride: 104 mmol/L (ref 98–111)
Creatinine, Ser: 0.7 mg/dL (ref 0.61–1.24)
GFR calc Af Amer: >60 mL/min (ref >60)
GFR calc non Af Amer: >60 mL/min (ref >60)
Glucose, Bld: 127 mg/dL — ABNORMAL HIGH (ref 70–99)
Potassium: 3.4 mmol/L — ABNORMAL LOW (ref 3.5–5.1)
Sodium: 137 mmol/L (ref 135–145)

## 2018-11-06 LAB — CBC
HCT: 27.9 % — ABNORMAL LOW (ref 39.0–52.0)
Hemoglobin: 9.4 g/dL — ABNORMAL LOW (ref 13.0–17.0)
MCH: 26.7 pg (ref 26.0–34.0)
MCHC: 33.7 g/dL (ref 30.0–36.0)
MCV: 79.3 fL — ABNORMAL LOW (ref 80.0–100.0)
Platelets: 403 10*3/uL — ABNORMAL HIGH (ref 150–400)
RBC: 3.52 MIL/uL — ABNORMAL LOW (ref 4.22–5.81)
RDW: 16.9 % — ABNORMAL HIGH (ref 11.5–15.5)
WBC: 6.4 10*3/uL (ref 4.0–10.5)
nRBC: 0 % (ref 0.0–0.2)

## 2018-11-06 LAB — PROTIME-INR
INR: 1.1 (ref 0.8–1.2)
Prothrombin Time: 14.2 seconds (ref 11.4–15.2)

## 2018-11-06 LAB — HEPARIN LEVEL (UNFRACTIONATED)
Heparin Unfractionated: 0.43 IU/mL (ref 0.30–0.70)
Heparin Unfractionated: 0.48 IU/mL (ref 0.30–0.70)

## 2018-11-06 LAB — MAGNESIUM: Magnesium: 1.9 mg/dL (ref 1.7–2.4)

## 2018-11-06 MED ORDER — WARFARIN SODIUM 7.5 MG PO TABS
7.5000 mg | ORAL_TABLET | Freq: Once | ORAL | Status: AC
  Administered 2018-11-06: 17:00:00 7.5 mg via ORAL
  Filled 2018-11-06: qty 1

## 2018-11-06 MED ORDER — POTASSIUM CHLORIDE CRYS ER 20 MEQ PO TBCR
60.0000 meq | EXTENDED_RELEASE_TABLET | Freq: Once | ORAL | Status: AC
  Administered 2018-11-06: 17:00:00 60 meq via ORAL
  Filled 2018-11-06: qty 3

## 2018-11-06 MED ORDER — LISINOPRIL 5 MG PO TABS
5.0000 mg | ORAL_TABLET | Freq: Every day | ORAL | Status: DC
  Administered 2018-11-06 – 2018-11-12 (×7): 5 mg via ORAL
  Filled 2018-11-06 (×7): qty 1

## 2018-11-06 NOTE — Progress Notes (Signed)
ANTICOAGULATION CONSULT NOTE - Follow-Up Consult  Pharmacy Consult for Heparin Indication: thrombus and CVA  No Known Allergies  Patient Measurements: Height: 6\' 1"  (185.4 cm) Weight: 131 lb (59.4 kg) IBW/kg (Calculated) : 79.9 Heparin Dosing Weight: 59.4 kg  Vital Signs: Temp: 98.6 F (37 C) (08/29 1100) Temp Source: Oral (08/29 1100) BP: 139/111 (08/29 1100) Pulse Rate: 78 (08/29 1100)  Labs: Recent Labs    11/03/18 1535 11/03/18 1857  11/04/18 0354  11/05/18 0454 11/05/18 1140 11/05/18 1536 11/06/18 0512 11/06/18 1255  HGB  --   --    < > 10.0*  --  10.2*  --   --  9.4*  --   HCT  --   --   --  30.9*  --  30.2*  --   --  27.9*  --   PLT  --   --   --  488*  --  420*  --   --  403*  --   LABPROT  --   --   --   --   --   --  14.1  --  14.2  --   INR  --   --   --   --   --   --  1.1  --  1.1  --   HEPARINUNFRC  --   --   --  <0.10*   < > 0.24*  --  0.58 0.43 0.48  TROPONINIHS 73* 67*  --   --   --   --   --   --   --   --    < > = values in this interval not displayed.    Estimated Creatinine Clearance: 84.6 mL/min (by C-G formula based on SCr of 0.77 mg/dL).   Medical History: Past Medical History:  Diagnosis Date  . Abnormal chest CT 02/2018   a. Ectatic 3 cm Ao arch - rec f/u outpt imaging w/ CTA or MRA. Ectatic atheromatous abd Ao @ risk for aneurysm - rec f/u u/s in 5 yrs. Marked prostatic enlargement w/ scattered small sclerotic foci in the thoracic lower lumbar spine, sacrum, left 12th rib, pelvis, and right femur.  Recommend elective outpatient whole-body bone scintigraphy and PSA.  Marland Kitchen CAD (coronary artery disease)    a. 02/2018 ACS/PCI: LM nl, LAD 85p (3.5x15 Sierra DES), D1 45ost, RI 55ost, RCA nl, EF 25-35%.  . Complete left bundle branch block (LBBB) 2016  . Current every day smoker   . HFrEF (heart failure with reduced ejection fraction) ([place])   . Hypertension   . Ischemic cardiomyopathy 02/2018   a. 02/2018 Echo: EF 35-40% (LV gram on cath was  25-30%), mod, diff HK. mid-apicalanteroseptal, ant, and apical sev HK. RVH.  Marland Kitchen Myocardial infarction ([place])   . Stroke ([place])   . TIA (transient ischemic attack) 2016  . Warthin's tumor     Assessment: 74 yoM transferred from [facility] with CVA for CTA work up. 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 No tPa was given. No AC PTA. Kary Kos is currently held. Pharmacy consulted to dose heparin and Warfarin. No surgery planned.  H/H stable. MRI brain with small right cerebral infarcts. Per neurology, small size of acute strokes, low likelihood of hemorrhagic transformation.  Started heparin drip without bolus stroke protocol. Heparin level is therapeutic for the second time in 8 hours 0.43>0.48, no S/Sx bleeding per nursing. Will redose his warfarin at 7.5mg  again tonight. INR this morning was 1.1.    Goal of  Therapy:  INR 2-3  HL 0.3-0.5 Monitor platelets by anticoagulation protocol: Yes   Plan:  Continue heparin at 1500 units/hr Recheck heparin level daily PT/INR daily   Nicoletta Dress, PharmD PGY2 Infectious Disease Pharmacy Resident  Please check AMION for all [facility] Pharmacy numbers 11/06/2018

## 2018-11-06 NOTE — Progress Notes (Signed)
CSW was consulted to speak with the patient about marijuana use and provide counseling.   CSW met with the patient at bedside and introduced herself. CSW provided resources to the patient. He felt that he did not have a problem. CSW left resources at the bedside for the patient.   CSW thanked the patient for his time.   CSW is signing off. Please re-consult if social work is needed again in the future.   Domenic Schwab, MSW, [place] Worker [facility]  [phone]

## 2018-11-06 NOTE — Progress Notes (Signed)
EKG completed by technician  Informed primary and cardiology, MDs aware Phlebotomy just left bedside Pt denies pain  Call light within reach Will continue to monitor

## 2018-11-06 NOTE — Progress Notes (Signed)
Pt had 18 beats of Vtach  Vital signs documented  Pt denies pain  Paged MD to inform

## 2018-11-06 NOTE — Progress Notes (Signed)
PROGRESS NOTE    Daniel Weiss  [id number] DOB: February 12, 1961 DOA: 11/03/2018 PCP: Patient, No Pcp Per   Brief Narrative:  Patient is a 58 year old male with history of coronary artery disease status post cardiac cath with angioplasty, ischemic cardiomyopathy with ejection fraction of 35 to 40%, hypertension, tobacco use, hyperlipidemia who presented at [facility] with complaints of left arm tingling, weakness, chest pain.  Imaging suggested acute ischemic stroke, possible clot in the distal ascending aorta and also possible left ventricle. CT coronary showed non-obstructing plaque in all coronary distributions,intimal irregularity with filling defect in the ascending aorta but no LV thrombus. Imagings also suggested  metastatic prostate cancer. Patient transferred to [facility]  Currently on heparin drip. Also started on warfarin. Neurology, cardiology, urology were following.  Assessment & Plan:   Principal Problem:   Acute cerebrovascular accident (CVA) ([place]) Active Problems:   Paresthesia   Essential hypertension   Hyponatremia   Hypokalemia   Tobacco abuse   Coronary artery disease involving native coronary artery of native heart with angina pectoris (HCC)   Hyperlipidemia LDL goal <70   Enlarged prostate   Ischemic cardiomyopathy   Acute cardioembolic stroke ([place])   Acute CVA: MRI showed right-sided frontoparietal infarcts.  Picture suggested embolic nature.  Imaging showed thrombus in the ascending aorta . Neurology was following.  Continue plavix, statin,coumadin. Currently does not have any focal neurological deficits.  He presented with left-sided weakness and tingling/numbness. PT/OT evaluation done.No follow up recommended. HbA1c of 5.3. LDL of 67.  Ascending aorta thrombus: Currently on heparin drip.  Also started on warfarin.Continue bridging until INR between 2-3 .CT coronary showed non-obstructing plaque in all coronary distributions,intimal irregularity with  filling defect in the ascending aorta but no LV thrombus.  Coronary artery disease: Denies any chest pain at present.  Status post LAD stenting.  Was on aspirin and Brilinta at home.  Currently on Plavix.  Systolic CHF: Echocardiogram showed ejection fraction of 35 to 40%, impaired left ventricular relaxation.    Currently he is euvolemic.Started on carvedilol with plan to maintain him on 6.25 twice daily on discharge.  Continue  lisinopril at 5 mg daily.  Prostate cancer with osseous metastasis: Incidental finding on CT scan.   Very high PSA.  Imagings also showed  bone metastasis.    Started on tamsulosin.  Urology following.Bone can showed solitary punctate focus of activity in the LEFT first sacral segment corresponding to a 9 mm mixed lytic and sclerotic metastasis on the recent CT.  Hyperlipidemia: Continue high dose statin  Hypertension: Currently blood stable.  Continue current medicines  Tobacco abuse: Counseled cessation.  Nicotine patch ordered.  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  Discussed with patient .  Patient communicating with family. Disposition Plan: Home after INR is between 2-3   Consultants: Cardiology, neurology, urology  Procedures: MRI  Antimicrobials:  Anti-infectives (From admission, onward)   None      Subjective:  Patient seen and examined the bedside this morning.  Comfortable.  Denies any pain or discomfort.  Was willing to know about his bone scan finding.  Objective: Vitals:   11/05/18 2350 11/05/18 2351 11/06/18 0356 11/06/18 0713  BP: 134/90 140/86 139/85 (!) 156/100  Pulse: 86 89 76 84  Resp: 16  16 17  Temp: 98.7 F (37.1 C)  98.7 F (37.1 C) 99.1 F  (37.3 C)  TempSrc: Oral  Oral Oral  SpO2: 100% 100% 98% 97%  Weight:      Height:        Intake/Output Summary (Last 24 hours) at 11/06/2018 1119 Last data filed at 11/06/2018 1012 Gross per 24 hour  Intake 2385.68 ml  Output 3800 ml  Net -1414.32 ml   Filed Weights   11/03/18 1121  Weight: 59.4 kg    Examination:  General exam: Appears calm and comfortable ,Not in distress,average built HEENT:PERRL,Oral mucosa moist, Ear/Nose normal on gross exam Respiratory system: Bilateral equal air entry, normal vesicular breath sounds, no wheezes or crackles  Cardiovascular system: S1 & S2 heard, RRR. No JVD, murmurs, rubs, gallops or clicks. Gastrointestinal system: Abdomen is nondistended, soft and nontender. No organomegaly or masses felt. Normal bowel sounds heard. Central nervous system: Alert and oriented. No focal neurological deficits. Extremities: No edema, no clubbing ,no cyanosis, distal peripheral pulses palpable. Skin: No rashes, lesions or ulcers,no icterus ,no pallor  Data Reviewed: I have personally reviewed following labs and imaging studies  CBC: Recent Labs  Lab 11/03/18 1140 11/04/18 0354 11/05/18 0454 11/06/18 0512  WBC 8.0 6.3 6.4 6.4  NEUTROABS 4.7  --   --   --   HGB 10.6* 10.0* 10.2* 9.4*  HCT 32.6* 30.9* 30.2* 27.9*  MCV 81.1 81.7 79.1* 79.3*  PLT 467* 488* 420* [id number]*   Basic Metabolic Panel: Recent Labs  Lab 11/03/18 1140  NA 134*  K 3.8  CL 100  CO2 24  GLUCOSE 107*  BUN 5*  CREATININE 0.77  CALCIUM 8.6*   GFR: Estimated Creatinine Clearance: 84.6 mL/min (by C-G formula based on SCr of 0.77 mg/dL). Liver Function Tests: Recent Labs  Lab 11/03/18 1140  AST 16  ALT 9  ALKPHOS 84  BILITOT 0.4  PROT 6.6  ALBUMIN 2.9*   No results for input(s): LIPASE, AMYLASE in the last 168 hours. No results for input(s): AMMONIA in the last 168 hours. Coagulation Profile: Recent Labs  Lab 11/03/18 1140 11/05/18 1140 11/06/18 0512  INR 1.0  1.1 1.1   Cardiac Enzymes: No results for input(s): CKTOTAL, CKMB, CKMBINDEX, TROPONINI in the last 168 hours. BNP (last 3 results) No results for input(s): PROBNP in the last 8760 hours. HbA1C: Recent Labs    11/04/18 0354  HGBA1C 5.3   CBG: Recent Labs  Lab 11/03/18 1123  GLUCAP 114*   Lipid Profile: Recent Labs    11/04/18 0354  CHOL 118  HDL 37*  LDLCALC 67  TRIG 70  CHOLHDL 3.2   Thyroid Function Tests: No results for input(s): TSH, T4TOTAL, FREET4, T3FREE, THYROIDAB in the last 72 hours. Anemia Panel: No results for input(s): VITAMINB12, FOLATE, FERRITIN, TIBC, IRON, RETICCTPCT in the last 72 hours. Sepsis Labs: No results for input(s): PROCALCITON, LATICACIDVEN in the last 168 hours.  Recent Results (from the past 240 hour(s))  SARS CORONAVIRUS 2 (TAT 6-12 HRS) Nasal Swab Aptima Multi Swab     Status: None   Collection Time: 11/03/18 11:57 AM   Specimen: Aptima Multi Swab; Nasal Swab  Result Value Ref Range Status   SARS Coronavirus 2 NEGATIVE NEGATIVE Final    Comment: (NOTE) SARS-CoV-2 target nucleic acids are NOT DETECTED. The SARS-CoV-2 RNA is generally detectable in upper and lower respiratory specimens during the acute phase of infection. Negative results do not preclude SARS-CoV-2 infection, do not rule out co-infections  with other pathogens, and should not be used as the sole basis for treatment or other patient management decisions. Negative results must be combined with clinical observations, patient history, and epidemiological information. The expected result is Negative. Fact Sheet for Patients: SugarRoll.be Fact Sheet for Healthcare Providers: https://www.woods-mathews.com/ This test is not yet approved or cleared by the [place] FDA and  has been authorized for detection and/or diagnosis of SARS-CoV-2 by FDA under an Emergency Use Authorization (EUA). This EUA will remain  in effect (meaning  this test can be used) for the duration of the COVID-19 declaration under Section 56 4(b)(1) of the Act, 21 U.S.C. section 360bbb-3(b)(1), unless the authorization is terminated or revoked sooner. Performed at [facility], [place] [address], [place], [place] [address]   Culture, blood (Routine X 2) w Reflex to ID Panel     Status: None (Preliminary result)   Collection Time: 11/04/18 11:05 AM   Specimen: BLOOD RIGHT ARM  Result Value Ref Range Status   Specimen Description BLOOD RIGHT ARM  Final   Special Requests   Final    BOTTLES DRAWN AEROBIC AND ANAEROBIC Blood Culture adequate volume   Culture   Final    NO GROWTH 1 DAY Performed at [facility], [place] [address] [place], [place] [address]    Report Status PENDING  Incomplete  Culture, blood (Routine X 2) w Reflex to ID Panel     Status: None (Preliminary result)   Collection Time: 11/04/18 11:09 AM   Specimen: BLOOD RIGHT HAND  Result Value Ref Range Status   Specimen Description BLOOD RIGHT HAND  Final   Special Requests   Final    BOTTLES DRAWN AEROBIC AND ANAEROBIC Blood Culture adequate volume   Culture   Final    NO GROWTH 1 DAY Performed at [facility], [place] [address], [place], [place] [address]    Report Status PENDING  Incomplete         Radiology Studies: Nm Bone Scan Whole Body  Result Date: 11/05/2018 CLINICAL DATA:  58 year old with markedly elevated PSA of 502. Recent CTA chest abdomen and pelvis demonstrated numerous sclerotic osseous lesions worrisome for metastatic disease. EXAM: NUCLEAR MEDICINE WHOLE BODY BONE SCAN TECHNIQUE: Whole body anterior and posterior images were obtained approximately 3 hours after intravenous injection of radiopharmaceutical. RADIOPHARMACEUTICALS:  21.7 mCi Technetium-53m MDP IV COMPARISON:  No prior nuclear imaging. Bone window images from CTA chest abdomen and pelvis 11/03/2018 is correlated. That examination demonstrated numerous very small round sclerotic  osseous metastases throughout the spine,, sacrum ribs, pelvis and proximal femora. FINDINGS: Solitary punctate focus of increased activity involving the LEFT first sacral segment, corresponding to a mixed lytic and sclerotic 9 mm metastasis on the recent CT. No abnormal skeletal elsewhere activity to suggest active bone formation, indicating that the vast majority of the multiple sclerotic osseous metastases are healed. Mild degenerative activity is present in the acromioclavicular joints bilaterally. IMPRESSION: 1. Solitary punctate focus of activity in the LEFT first sacral segment corresponding to a 9 mm mixed lytic and sclerotic metastasis on the recent CT. 2. The remaining mom numerous sclerotic osseous metastases identified on CT do not demonstrate abnormal activity and are therefore healed. Electronically Signed   By: Evangeline Dakin M.D.   On: 11/05/2018 14:48        Scheduled Meds:  atorvastatin  80 mg Oral Once   carvedilol  3.125 mg Oral BID WC   clopidogrel  75 mg Oral  Daily   feeding supplement (ENSURE ENLIVE)  237 mL Oral TID BM   lisinopril  5 mg Oral Daily   nicotine  21 mg Transdermal Daily   tamsulosin  0.4 mg Oral QPC supper   Warfarin - Pharmacist Dosing Inpatient   Does not apply q1800   Continuous Infusions:  sodium chloride 75 mL/hr at 11/06/18 0604   heparin 1,500 Units/hr (11/05/18 1843)     LOS: 3 days    Time spent: 35 mins.More than 50% of that time was spent in counseling and/or coordination of care.      Shelly Coss, MD [facility] Pager [phone]  If 7PM-7AM, please contact night-coverage [email or web address] Password [email or web address] 11/06/2018, 11:19 AM

## 2018-11-06 NOTE — Progress Notes (Signed)
MD aware of pt Vtach  No new orders  Will continue to monitor

## 2018-11-07 LAB — PROTIME-INR
INR: 1.1 (ref 0.8–1.2)
Prothrombin Time: 14.3 seconds (ref 11.4–15.2)

## 2018-11-07 LAB — BASIC METABOLIC PANEL
Anion gap: 9 (ref 5–15)
BUN: 5 mg/dL — ABNORMAL LOW (ref 6–20)
CO2: 21 mmol/L — ABNORMAL LOW (ref 22–32)
Calcium: 8.6 mg/dL — ABNORMAL LOW (ref 8.9–10.3)
Chloride: 108 mmol/L (ref 98–111)
Creatinine, Ser: 0.57 mg/dL — ABNORMAL LOW (ref 0.61–1.24)
GFR calc Af Amer: >60 mL/min (ref >60)
GFR calc non Af Amer: >60 mL/min (ref >60)
Glucose, Bld: 86 mg/dL (ref 70–99)
Potassium: 4.4 mmol/L (ref 3.5–5.1)
Sodium: 138 mmol/L (ref 135–145)

## 2018-11-07 LAB — CBC
HCT: 29.1 % — ABNORMAL LOW (ref 39.0–52.0)
Hemoglobin: 9.4 g/dL — ABNORMAL LOW (ref 13.0–17.0)
MCH: 26.4 pg (ref 26.0–34.0)
MCHC: 32.3 g/dL (ref 30.0–36.0)
MCV: 81.7 fL (ref 80.0–100.0)
Platelets: 375 10*3/uL (ref 150–400)
RBC: 3.56 MIL/uL — ABNORMAL LOW (ref 4.22–5.81)
RDW: 17.5 % — ABNORMAL HIGH (ref 11.5–15.5)
WBC: 6.3 10*3/uL (ref 4.0–10.5)
nRBC: 0 % (ref 0.0–0.2)

## 2018-11-07 LAB — HEPARIN LEVEL (UNFRACTIONATED): Heparin Unfractionated: 0.34 IU/mL (ref 0.30–0.70)

## 2018-11-07 MED ORDER — WARFARIN SODIUM 5 MG PO TABS
10.0000 mg | ORAL_TABLET | Freq: Once | ORAL | Status: AC
  Administered 2018-11-07: 18:00:00 10 mg via ORAL
  Filled 2018-11-07: qty 2

## 2018-11-07 MED ORDER — WARFARIN SODIUM 7.5 MG PO TABS: 7.5000 mg | ORAL_TABLET | Freq: Once | ORAL | Status: DC

## 2018-11-07 NOTE — Progress Notes (Signed)
Occupational Therapy Treatment Patient Details Name: Daniel Weiss MRN: [id number] DOB: 1960/04/23 Today's Date: 11/07/2018    History of present illness Pt is a  58 yo male Admitted secondary to LUE numbness.  MRI: Small acute right frontoparietal infarcts. CT angio revealing dissection vs clot and possible metastic dx of prostate with multiple sclerotic bone lesions. PMHX: HLD, HLD, s/p cardiac angio.   OT comments  Pt. Seen for skilled OT treatments session. Focus of session is continued work with higher level IADLS with pt. demonstration of safety and cognitive strategies throughout.  physically pt. Remains S level during completion of IADLS.  Can verbalize awareness of safety issues but still requires cueing for implementation of strategies during task completion.    Follow Up Recommendations  No OT follow up    Equipment Recommendations  None recommended by OT    Recommendations for Other Services      Precautions / Restrictions Precautions Precautions: Fall       Mobility Bed Mobility Overal bed mobility: Modified Independent                Transfers Overall transfer level: Needs assistance Equipment used: None Transfers: Sit to/from [employer] Transfers Sit to Stand: Supervision Stand pivot transfers: Supervision       General transfer comment: Supervision for safety.     Balance                                           ADL either performed or assessed with clinical judgement   ADL                                         General ADL Comments: in room IADLS with focus on pts. cognitive awareness through integration of safety strategies during tasks.  pt. able to make bed but required cues for IV management.  had verbalized that he "sometimes gets tired".  when he mentioned this i asked what he does in those situations.  he states "i just stop and catch my breath then keep going".  i asked how he rested he said he  just stands there.  i encouraged that seated rest breaks might be safer and more productive to feeling better.  he verbalized understanding but did not initiate this method during the session. the rn came in during session and stated that she was wanting to check on him as his rate was getting a little "tacky".  i asked pt. how he was feeling and he said "i was starting to feel a little tired and dizzy".  i had him sit to rest, rn states pt. is fine and back to normal limits.  reviewed that pt. could have initiated rest break. he changed the subject.     Vision       Perception     Praxis      Cognition Arousal/Alertness: Awake/alert Behavior During Therapy: WFL for tasks assessed/performed Overall Cognitive Status: Within Functional Limits for tasks assessed                                 General Comments: see general ADL notes for details        Exercises     Shoulder  Instructions       General Comments  reports strong family support from 2 sisters that will be living with him. Also reports having children that are active in his life.  States his main sadness is from the idea that he may not be able to return to work. 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 Says it is going to be tough to find a way to accept that loss.     Pertinent Vitals/ Pain       Pain Assessment: No/denies pain  Home Living                                          Prior Functioning/Environment              Frequency  Min 2X/week        Progress Toward Goals  OT Goals(current goals can now be found in the care plan section)  Progress towards OT goals: Progressing toward goals     Plan Discharge plan remains appropriate    Co-evaluation                 AM-PAC OT "6 Clicks" Daily Activity     Outcome Measure   Help from another person eating meals?: None Help from another person  taking care of personal grooming?: A Little Help from another person toileting, which includes using toliet, bedpan, or urinal?: A Little Help from another person bathing (including washing, rinsing, drying)?: A Little Help from another person to put on and taking off regular upper body clothing?: None Help from another person to put on and taking off regular lower body clothing?: A Little 6 Click Score: 20    End of Session    OT Visit Diagnosis: Unsteadiness on feet (R26.81);Muscle weakness (generalized) (M62.81)   Activity Tolerance Patient tolerated treatment well   Patient Left in chair;with call bell/phone within reach   Nurse Communication Mobility status;Other (comment)(spoke with rn to see if pt. needed chair alarm or not, she says he does and she will turn it on for him)        Time: 0807-0822 OT Time Calculation (min): 15 min  Charges: OT General Charges $OT Visit: 1 Visit OT Treatments $Self Care/Home Management : 8-22 mins  Janice Coffin, COTA/L 11/07/2018, 9:56 AM

## 2018-11-07 NOTE — Progress Notes (Addendum)
ANTICOAGULATION CONSULT NOTE - Follow-Up Consult  Pharmacy Consult for Heparin Indication: thrombus and CVA  No Known Allergies  Patient Measurements: Height: 6\' 1"  (185.4 cm) Weight: 131 lb (59.4 kg) IBW/kg (Calculated) : 79.9 Heparin Dosing Weight: 59.4 kg  Vital Signs: Temp: 98.5 F (36.9 C) (08/30 0400) Temp Source: Oral (08/30 0400) BP: 105/73 (08/30 0400) Pulse Rate: 71 (08/30 0400)  Labs: Recent Labs    11/05/18 0454 11/05/18 1140  11/06/18 0512 11/06/18 1255 11/06/18 1404 11/07/18 0458  HGB 10.2*  --   --  9.4*  --   --  9.4*  HCT 30.2*  --   --  27.9*  --   --  29.1*  PLT 420*  --   --  403*  --   --  375  LABPROT  --  14.1  --  14.2  --   --  14.3  INR  --  1.1  --  1.1  --   --  1.1  HEPARINUNFRC 0.24*  --    < > 0.43 0.48  --  0.34  CREATININE  --   --   --   --   --  0.70 0.57*   < > = values in this interval not displayed.    Estimated Creatinine Clearance: 84.6 mL/min (A) (by C-G formula based on SCr of 0.57 mg/dL (L)).   Medical History: Past Medical History:  Diagnosis Date  . Abnormal chest CT 02/2018   a. Ectatic 3 cm Ao arch - rec f/u outpt imaging w/ CTA or MRA. Ectatic atheromatous abd Ao @ risk for aneurysm - rec f/u u/s in 5 yrs. Marked prostatic enlargement w/ scattered small sclerotic foci in the thoracic lower lumbar spine, sacrum, left 12th rib, pelvis, and right femur.  Recommend elective outpatient whole-body bone scintigraphy and PSA.  Marland Kitchen CAD (coronary artery disease)    a. 02/2018 ACS/PCI: LM nl, LAD 85p (3.5x15 [place] DES), D1 45ost, RI 55ost, RCA nl, EF 25-35%.  . Complete left bundle branch block (LBBB) 2016  . Current every day smoker   . HFrEF (heart failure with reduced ejection fraction) ([place])   . Hypertension   . Ischemic cardiomyopathy 02/2018   a. 02/2018 Echo: EF 35-40% (LV gram on cath was 25-30%), mod, diff HK. mid-apicalanteroseptal, ant, and apical sev HK. RVH.  Marland Kitchen Myocardial infarction ([place])   . Stroke ([place])   .  TIA (transient ischemic attack) 2016  . Warthin's tumor     Assessment: 57 yoM [facility] with CVA for CTA work up. 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 No tPa was given. No AC PTA. Kary Kos is currently held. Pharmacy consulted to dose heparin and Warfarin. No surgery planned.  H/H stable. MRI brain with small right cerebral infarcts. Per neurology, small size of acute strokes, low likelihood of hemorrhagic transformation.  Started heparin drip without bolus stroke protocol. Heparin level is therapeutic for the second time in 8 hours 0.43>0.480.34, no S/Sx bleeding per nursing. Will redose his warfarin at 10mg  tonight. INR this morning was 1.1 again.   Goal of Therapy:  INR 2-3  HL 0.3-0.5 Monitor platelets by anticoagulation protocol: Yes   Plan:  Continue heparin at 1500 units/hr Recheck heparin level daily PT/INR daily  CBC daily  Nicoletta Dress, PharmD PGY2 Infectious Disease Pharmacy Resident  Please check AMION for all [facility] Pharmacy numbers 11/07/2018

## 2018-11-07 NOTE — Progress Notes (Signed)
PROGRESS NOTE    Daniel Weiss  [id number] DOB: 11-16-1960 DOA: 11/03/2018 PCP: Patient, No Pcp Per   Brief Narrative:  Patient is a 58 year old male with history of coronary artery disease status post cardiac cath with angioplasty, ischemic cardiomyopathy with ejection fraction of 35 to 40%, hypertension, tobacco use, hyperlipidemia who presented at [facility] with complaints of left arm tingling, weakness, chest pain.  Imaging suggested acute ischemic stroke, possible clot in the distal ascending aorta and also possible left ventricle. CT coronary showed non-obstructing plaque in all coronary distributions,intimal irregularity with filling defect in the ascending aorta but no LV thrombus. Imagings also suggested  metastatic prostate cancer. Patient transferred to [facility].  Currently on heparin drip. Also started on warfarin.  Plan for discharge after INR reaches 2.  Assessment & Plan:   Principal Problem:   Acute cerebrovascular accident (CVA) ([place]) Active Problems:   Paresthesia   Essential hypertension   Hyponatremia   Hypokalemia   Tobacco abuse   Coronary artery disease involving native coronary artery of native heart with angina pectoris (HCC)   Hyperlipidemia LDL goal <70   Enlarged prostate   Ischemic cardiomyopathy   Acute cardioembolic stroke ([place])   Acute CVA: MRI showed right-sided frontoparietal infarcts.  Picture suggested embolic nature.  Imaging showed thrombus in the ascending aorta . Neurology was following.  Continue plavix, statin,coumadin. Currently does not have any focal neurological deficits.  He presented with left-sided weakness and tingling/numbness. PT/OT evaluation done.No follow up recommended. HbA1c of 5.3. LDL of 67.  Ascending aorta thrombus: Currently on heparin drip.  Also started on warfarin.Continue bridging until INR between 2-3 .CT coronary showed non-obstructing plaque in all coronary distributions,intimal irregularity with filling  defect in the ascending aorta but no LV thrombus.  Coronary artery disease: Denies any chest pain at present.  Status post LAD stenting.  Was on aspirin and Brilinta at home.  Currently on Plavix.  Systolic CHF: Echocardiogram showed ejection fraction of 35 to 40%, impaired left ventricular relaxation.    Currently he is euvolemic.Started on carvedilol with plan to maintain him on 6.25 twice daily on discharge.  Continue  lisinopril at 5 mg daily.  Prostate cancer with osseous metastasis: Incidental finding on CT scan.   Very high PSA.  Imagings also showed  bone metastasis.    Started on tamsulosin.  Urology following.Bone can showed solitary punctate focus of activity in the LEFT first sacral segment corresponding to a 9 mm mixed lytic and sclerotic metastasis on the recent CT.  Hyperlipidemia: Continue high dose statin  Hypertension: Currently blood stable.  Continue current medicines  Tobacco abuse: Counseled cessation.  Nicotine patch ordered.  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  Discussed with patient .  Patient communicating with family. Disposition Plan: Home after INR is between 2-3   Consultants: Cardiology, neurology, urology  Procedures: MRI  Antimicrobials:  Anti-infectives (From admission, onward)   None      Subjective:  Patient seen and examined the bedside this morning.  Remains comfortable.  Hemodynamically stable.  No new complaints   Objective: Vitals:   11/06/18 1629 11/07/18 0000 11/07/18 0400 11/07/18 1000  BP: 132/84 121/81 105/73 (!) 142/97  Pulse: 71 83 71 83  Resp: 18 19 17 20   Temp:  98.5 F (36.9 C) 98.5 F (36.9 C) 98.2 F (36.8 C)  TempSrc:  Oral Oral  Oral  SpO2:  100% 100% 100%  Weight:      Height:        Intake/Output Summary (Last 24 hours) at 11/07/2018 1031 Last data filed at 11/07/2018 1006 Gross per 24 hour  Intake 1820.59 ml  Output 1325 ml  Net 495.59 ml   Filed Weights   11/03/18 1121  Weight: 59.4 kg    Examination:  General exam: Appears calm and comfortable ,Not in distress,average built HEENT:PERRL,Oral mucosa moist, Ear/Nose normal on gross exam Respiratory system: Bilateral equal air entry, normal vesicular breath sounds, no wheezes or crackles  Cardiovascular system: S1 & S2 heard, RRR. No JVD, murmurs, rubs, gallops or clicks. Gastrointestinal system: Abdomen is nondistended, soft and nontender. No organomegaly or masses felt. Normal bowel sounds heard. Central nervous system: Alert and oriented. No focal neurological deficits. Extremities: No edema, no clubbing ,no cyanosis, distal peripheral pulses palpable. Skin: No rashes, lesions or ulcers,no icterus ,no pallor MSK: Normal muscle bulk,tone ,power Psychiatry: Judgement and insight appear normal. Mood & affect appropriate.    Data Reviewed: I have personally reviewed following labs and imaging studies  CBC: Recent Labs  Lab 11/03/18 1140 11/04/18 0354 11/05/18 0454 11/06/18 0512 11/07/18 0458  WBC 8.0 6.3 6.4 6.4 6.3  NEUTROABS 4.7  --   --   --   --   HGB 10.6* 10.0* 10.2* 9.4* 9.4*  HCT 32.6* 30.9* 30.2* 27.9* 29.1*  MCV 81.1 81.7 79.1* 79.3* 81.7  PLT 467* 488* 420* 403* [id number]   Basic Metabolic Panel: Recent Labs  Lab 11/03/18 1140 11/06/18 1404 11/07/18 0458  NA 134* 137 138  K 3.8 3.4* 4.4  CL 100 104 108  CO2 24 25 21*  GLUCOSE 107* 127* 86  BUN 5* <5* 5*  CREATININE 0.77 0.70 0.57*  CALCIUM 8.6* 8.5* 8.6*  MG  --  1.9  --    GFR: Estimated Creatinine Clearance: 84.6 mL/min (A) (by C-G formula based on SCr of 0.57 mg/dL (L)). Liver Function Tests: Recent Labs  Lab 11/03/18 1140  AST 16  ALT 9  ALKPHOS 84  BILITOT 0.4   PROT 6.6  ALBUMIN 2.9*   No results for input(s): LIPASE, AMYLASE in the last 168 hours. No results for input(s): AMMONIA in the last 168 hours. Coagulation Profile: Recent Labs  Lab 11/03/18 1140 11/05/18 1140 11/06/18 0512 11/07/18 0458  INR 1.0 1.1 1.1 1.1   Cardiac Enzymes: No results for input(s): CKTOTAL, CKMB, CKMBINDEX, TROPONINI in the last 168 hours. BNP (last 3 results) No results for input(s): PROBNP in the last 8760 hours. HbA1C: No results for input(s): HGBA1C in the last 72 hours. CBG: Recent Labs  Lab 11/03/18 1123  GLUCAP 114*   Lipid Profile: No results for input(s): CHOL, HDL, LDLCALC, TRIG, CHOLHDL, LDLDIRECT in the last 72 hours. Thyroid Function Tests: No results for input(s): TSH, T4TOTAL, FREET4, T3FREE, THYROIDAB in the last 72 hours. Anemia Panel: No results for input(s): VITAMINB12, FOLATE, FERRITIN, TIBC, IRON, RETICCTPCT in the last 72 hours. Sepsis Labs: No results for input(s): PROCALCITON, LATICACIDVEN in the last 168 hours.  Recent Results (from the past 240 hour(s))  SARS CORONAVIRUS 2 (TAT 6-12 HRS) Nasal Swab Aptima Multi Swab     Status: None   Collection Time: 11/03/18 11:57 AM   Specimen: Aptima Multi Swab; Nasal Swab  Result Value Ref Range Status   SARS Coronavirus 2  NEGATIVE NEGATIVE Final    Comment: (NOTE) SARS-CoV-2 target nucleic acids are NOT DETECTED. The SARS-CoV-2 RNA is generally detectable in upper and lower respiratory specimens during the acute phase of infection. Negative results do not preclude SARS-CoV-2 infection, do not rule out co-infections with other pathogens, and should not be used as the sole basis for treatment or other patient management decisions. Negative results must be combined with clinical observations, patient history, and epidemiological information. The expected result is Negative. Fact Sheet for Patients: SugarRoll.be Fact Sheet for Healthcare Providers:  https://www.woods-mathews.com/ This test is not yet approved or cleared by the [place] FDA and  has been authorized for detection and/or diagnosis of SARS-CoV-2 by FDA under an Emergency Use Authorization (EUA). This EUA will remain  in effect (meaning this test can be used) for the duration of the COVID-19 declaration under Section 56 4(b)(1) of the Act, 21 U.S.C. section 360bbb-3(b)(1), unless the authorization is terminated or revoked sooner. Performed at [facility], [place] [address], [place], [place] [address]   Culture, blood (Routine X 2) w Reflex to ID Panel     Status: None (Preliminary result)   Collection Time: 11/04/18 11:05 AM   Specimen: BLOOD RIGHT ARM  Result Value Ref Range Status   Specimen Description BLOOD RIGHT ARM  Final   Special Requests   Final    BOTTLES DRAWN AEROBIC AND ANAEROBIC Blood Culture adequate volume   Culture   Final    NO GROWTH 3 DAYS Performed at [facility], [place] [address] [place], [place] [address]    Report Status PENDING  Incomplete  Culture, blood (Routine X 2) w Reflex to ID Panel     Status: None (Preliminary result)   Collection Time: 11/04/18 11:09 AM   Specimen: BLOOD RIGHT HAND  Result Value Ref Range Status   Specimen Description BLOOD RIGHT HAND  Final   Special Requests   Final    BOTTLES DRAWN AEROBIC AND ANAEROBIC Blood Culture adequate volume   Culture   Final    NO GROWTH 3 DAYS Performed at [facility], [place] [address] [place], [place] [address]    Report Status PENDING  Incomplete         Radiology Studies: Nm Bone Scan Whole Body  Result Date: 11/05/2018 CLINICAL DATA:  58 year old with markedly elevated PSA of 502. Recent CTA chest abdomen and pelvis demonstrated numerous sclerotic osseous lesions worrisome for metastatic disease. EXAM: NUCLEAR MEDICINE WHOLE BODY BONE SCAN TECHNIQUE: Whole body anterior and posterior images were obtained approximately 3 hours after  intravenous injection of radiopharmaceutical. RADIOPHARMACEUTICALS:  21.7 mCi Technetium-15m MDP IV COMPARISON:  No prior nuclear imaging. Bone window images from CTA chest abdomen and pelvis 11/03/2018 is correlated. That examination demonstrated numerous very small round sclerotic osseous metastases throughout the spine,, sacrum ribs, pelvis and proximal femora. FINDINGS: Solitary punctate focus of increased activity involving the LEFT first sacral segment, corresponding to a mixed lytic and sclerotic 9 mm metastasis on the recent CT. No abnormal skeletal elsewhere activity to suggest active bone formation, indicating that the vast majority of the multiple sclerotic osseous metastases are healed. Mild degenerative activity is present in the acromioclavicular joints bilaterally. IMPRESSION: 1. Solitary punctate focus of activity in the LEFT first sacral segment corresponding to a 9 mm mixed lytic and sclerotic metastasis on the recent CT. 2. The remaining mom numerous sclerotic osseous metastases identified on CT do not demonstrate abnormal activity and are therefore healed. Electronically  Signed   By: Evangeline Dakin M.D.   On: 11/05/2018 14:48        Scheduled Meds: . atorvastatin  80 mg Oral Once  . carvedilol  3.125 mg Oral BID WC  . clopidogrel  75 mg Oral Daily  . feeding supplement (ENSURE ENLIVE)  237 mL Oral TID BM  . lisinopril  5 mg Oral Daily  . nicotine  21 mg Transdermal Daily  . tamsulosin  0.4 mg Oral QPC supper  . warfarin  7.5 mg Oral ONCE-1800  . Warfarin - Pharmacist Dosing Inpatient   Does not apply q1800   Continuous Infusions: . sodium chloride 1,000 mL (11/07/18 0933)  . heparin 1,500 Units/hr (11/07/18 0722)     LOS: 4 days    Time spent: 35 mins.More than 50% of that time was spent in counseling and/or coordination of care.      Shelly Coss, MD [facility] Pager [phone]  If 7PM-7AM, please contact night-coverage [email or web address] Password  [email or web address] 11/07/2018, 10:31 AM

## 2018-11-08 LAB — CBC
HCT: 27.7 % — ABNORMAL LOW (ref 39.0–52.0)
Hemoglobin: 9.2 g/dL — ABNORMAL LOW (ref 13.0–17.0)
MCH: 26.3 pg (ref 26.0–34.0)
MCHC: 33.2 g/dL (ref 30.0–36.0)
MCV: 79.1 fL — ABNORMAL LOW (ref 80.0–100.0)
Platelets: 372 10*3/uL (ref 150–400)
RBC: 3.5 MIL/uL — ABNORMAL LOW (ref 4.22–5.81)
RDW: 17.1 % — ABNORMAL HIGH (ref 11.5–15.5)
WBC: 6.4 10*3/uL (ref 4.0–10.5)
nRBC: 0 % (ref 0.0–0.2)

## 2018-11-08 LAB — PROTIME-INR
INR: 1.2 (ref 0.8–1.2)
Prothrombin Time: 14.6 seconds (ref 11.4–15.2)

## 2018-11-08 LAB — HEPARIN LEVEL (UNFRACTIONATED): Heparin Unfractionated: 0.41 IU/mL (ref 0.30–0.70)

## 2018-11-08 MED ORDER — WARFARIN SODIUM 5 MG PO TABS
10.0000 mg | ORAL_TABLET | Freq: Once | ORAL | Status: AC
  Administered 2018-11-08: 10 mg via ORAL
  Filled 2018-11-08: qty 2

## 2018-11-08 NOTE — Progress Notes (Signed)
   11/08/18 0957  Clinical Encounter Type  Visited With Patient  Visit Type Initial  Referral From Nurse;Care management  Consult/Referral To Chaplain  Spiritual Encounters  Spiritual Needs Emotional;Grief support  Stress Factors  Patient Stress Factors Health changes;Lack of knowledge   Chaplain visited after referral from the nurse.  Burwell received a diagnosis of cancer so the chaplain visited the patient for support .  Lebaron expressed a good attitude concerning the diagnosis and was positive about the treatment.  He was just mildly frustrated with the lack of information and the waiting.  Yamil also expressed some heavy emotions concerning the unavailability of family, as well as the reality of the unknown.  Family seemed to be very important to him.  Izais seems to be grieving the loss of his way of life, as he will no longer be able to work and will also need to live with a sister.  Jamicah also expressed some of the deaths of family members and expressed some heavy emotions.  The chaplain provided emotional support by listening, as well as information on the pastoral care services available.  Torino was appreciative of the visit and said that if he needed any further assistance he would let his nurse know.  Chaplain will follow-up as needed  Brion Aliment Chaplain Resident For questions concerning this note please contact me by pager [phone]

## 2018-11-08 NOTE — Progress Notes (Signed)
ANTICOAGULATION CONSULT NOTE - Follow-Up Consult  Pharmacy Consult for Heparin Indication: thrombus and CVA  No Known Allergies  Patient Measurements: Height: 6\' 1"  (185.4 cm) Weight: 131 lb (59.4 kg) IBW/kg (Calculated) : 79.9 Heparin Dosing Weight: 59.4 kg  Vital Signs: Temp: 98.8 F (37.1 C) (08/31 0810) Temp Source: Oral (08/31 0810) BP: 125/84 (08/31 0920) Pulse Rate: 63 (08/31 0810)  Labs: Recent Labs    11/06/18 0512 11/06/18 1255 11/06/18 1404 11/07/18 0458 11/08/18 0426  HGB 9.4*  --   --  9.4* 9.2*  HCT 27.9*  --   --  29.1* 27.7*  PLT 403*  --   --  375 372  LABPROT 14.2  --   --  14.3 14.6  INR 1.1  --   --  1.1 1.2  HEPARINUNFRC 0.43 0.48  --  0.34 0.41  CREATININE  --   --  0.70 0.57*  --     Estimated Creatinine Clearance: 84.6 mL/min (A) (by C-G formula based on SCr of 0.57 mg/dL (L)).  Assessment: 15 yoM transferred from [facility] with CVA for CTA work up. 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 No tPa was given. No AC PTA. Kary Kos is currently held.   Hep gtt; INR 1.2 Warfarin bridge 8/28>>  CBC stable  Goal of Therapy:  INR 2-3  HL 0.3-0.5 Monitor platelets by anticoagulation protocol: Yes   Plan:  Continue heparin 1500 units/hr Warfarin 10 mg x 1 Daily INR  Levester Fresh, PharmD, BCPS, BCCCP Clinical Pharmacist [phone]  Please check AMION for all [place] numbers  11/08/2018 9:24 AM

## 2018-11-08 NOTE — Progress Notes (Signed)
PROGRESS NOTE    Daniel Weiss  [id number] DOB: 1960/12/09 DOA: 11/03/2018 PCP: Patient, No Pcp Per   Brief Narrative:  Patient is a 58 year old male with history of coronary artery disease status post cardiac cath with angioplasty, ischemic cardiomyopathy with ejection fraction of 35 to 40%, hypertension, tobacco use, hyperlipidemia who presented at [facility] with complaints of left arm tingling, weakness, chest pain.  Imaging suggested acute ischemic stroke, possible clot in the distal ascending aorta and also possible left ventricle. CT coronary showed non-obstructing plaque in all coronary distributions,intimal irregularity with filling defect in the ascending aorta but no LV thrombus. Imagings also suggested  metastatic prostate cancer. Patient transferred to [facility].  Currently on heparin drip. Also started on warfarin.  Plan for discharge after INR reaches 2.  Assessment & Plan:   Principal Problem:   Acute cerebrovascular accident (CVA) ([place]) Active Problems:   Paresthesia   Essential hypertension   Hyponatremia   Hypokalemia   Tobacco abuse   Coronary artery disease involving native coronary artery of native heart with angina pectoris (HCC)   Hyperlipidemia LDL goal <70   Enlarged prostate   Ischemic cardiomyopathy   Acute cardioembolic stroke ([place])   Acute CVA: MRI showed right-sided frontoparietal infarcts.  Picture suggested embolic nature.  Imaging showed thrombus in the ascending aorta . Neurology was following.  Continue plavix, statin,coumadin. Currently does not have any focal neurological deficits.  He presented with left-sided weakness and tingling/numbness. PT/OT evaluation done.No follow up recommended. HbA1c of 5.3. LDL of 67.  Ascending aorta thrombus: Currently on heparin drip.  Also started on warfarin.Continue bridging until INR between 2-3 .CT coronary showed non-obstructing plaque in all coronary distributions,intimal irregularity with filling  defect in the ascending aorta but no LV thrombus. I have requested case manager for arranging a follow-up for INR as an outpatient  Coronary artery disease: Denies any chest pain at present.  Status post LAD stenting.  Was on aspirin and Brilinta at home.  Currently on Plavix.  Systolic CHF: Echocardiogram showed ejection fraction of 35 to 40%, impaired left ventricular relaxation.    Currently he is euvolemic.Started on carvedilol with plan to maintain him on 6.25 twice daily on discharge.  Continue  lisinopril at 5 mg daily.  Prostate cancer with osseous metastasis: Incidental finding on CT scan.   Very high PSA.  Imagings also showed  bone metastasis.    Started on tamsulosin.  Urology following.Bone can showed solitary punctate focus of activity in the LEFT first sacral segment corresponding to a 9 mm mixed lytic and sclerotic metastasis on the recent CT.  Hyperlipidemia: Continue high dose statin  Hypertension: Currently blood stable.  Continue current medicines  Tobacco abuse: Counseled cessation.  Nicotine patch ordered.  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  Discussed with patient .  Patient communicating with family. Disposition Plan: Home after INR is between 2-3   Consultants: Cardiology, neurology, urology  Procedures: MRI  Antimicrobials:  Anti-infectives (From admission, onward)   None      Subjective:  Patient seen and examined the bedside this morning.  Remains comfortable.  Hemodynamically stable.  No new complaints   Objective: Vitals:   11/07/18 2324 11/08/18 0337 11/08/18 0810 11/08/18 0920  BP: 104/73 110/75 126/90 125/84  Pulse: 82 79 63   Resp: 16 18 20    Temp: 98.3  F (36.8 C) 98.2 F (36.8 C) 98.8 F (37.1 C)   TempSrc: Oral Oral Oral   SpO2: 100% 100% 100%   Weight:      Height:        Intake/Output Summary (Last 24 hours) at 11/08/2018 1105 Last data filed at 11/08/2018 [id number] Gross per 24 hour  Intake 3701.65 ml  Output 2200 ml  Net 1501.65 ml   Filed Weights   11/03/18 1121  Weight: 59.4 kg    Examination:  General exam: Appears calm and comfortable ,Not in distress,average built HEENT:PERRL,Oral mucosa moist, Ear/Nose normal on gross exam Respiratory system: Bilateral equal air entry, normal vesicular breath sounds, no wheezes or crackles  Cardiovascular system: S1 & S2 heard, RRR. No JVD, murmurs, rubs, gallops or clicks. Gastrointestinal system: Abdomen is nondistended, soft and nontender. No organomegaly or masses felt. Normal bowel sounds heard. Central nervous system: Alert and oriented. No focal neurological deficits. Extremities: No edema, no clubbing ,no cyanosis, distal peripheral pulses palpable. Skin: No rashes, lesions or ulcers,no icterus ,no pallor      Data Reviewed: I have personally reviewed following labs and imaging studies  CBC: Recent Labs  Lab 11/03/18 1140 11/04/18 0354 11/05/18 0454 11/06/18 0512 11/07/18 0458 11/08/18 0426  WBC 8.0 6.3 6.4 6.4 6.3 6.4  NEUTROABS 4.7  --   --   --   --   --   HGB 10.6* 10.0* 10.2* 9.4* 9.4* 9.2*  HCT 32.6* 30.9* 30.2* 27.9* 29.1* 27.7*  MCV 81.1 81.7 79.1* 79.3* 81.7 79.1*  PLT 467* 488* 420* 403* 375 [id number]   Basic Metabolic Panel: Recent Labs  Lab 11/03/18 1140 11/06/18 1404 11/07/18 0458  NA 134* 137 138  K 3.8 3.4* 4.4  CL 100 104 108  CO2 24 25 21*  GLUCOSE 107* 127* 86  BUN 5* <5* 5*  CREATININE 0.77 0.70 0.57*  CALCIUM 8.6* 8.5* 8.6*  MG  --  1.9  --    GFR: Estimated Creatinine Clearance: 84.6 mL/min (A) (by C-G formula based on SCr of 0.57 mg/dL (L)). Liver Function Tests: Recent Labs  Lab 11/03/18 1140  AST 16  ALT 9  ALKPHOS 84   BILITOT 0.4  PROT 6.6  ALBUMIN 2.9*   No results for input(s): LIPASE, AMYLASE in the last 168 hours. No results for input(s): AMMONIA in the last 168 hours. Coagulation Profile: Recent Labs  Lab 11/03/18 1140 11/05/18 1140 11/06/18 0512 11/07/18 0458 11/08/18 0426  INR 1.0 1.1 1.1 1.1 1.2   Cardiac Enzymes: No results for input(s): CKTOTAL, CKMB, CKMBINDEX, TROPONINI in the last 168 hours. BNP (last 3 results) No results for input(s): PROBNP in the last 8760 hours. HbA1C: No results for input(s): HGBA1C in the last 72 hours. CBG: Recent Labs  Lab 11/03/18 1123  GLUCAP 114*   Lipid Profile: No results for input(s): CHOL, HDL, LDLCALC, TRIG, CHOLHDL, LDLDIRECT in the last 72 hours. Thyroid Function Tests: No results for input(s): TSH, T4TOTAL, FREET4, T3FREE, THYROIDAB in the last 72 hours. Anemia Panel: No results for input(s): VITAMINB12, FOLATE, FERRITIN, TIBC, IRON, RETICCTPCT in the last 72 hours. Sepsis Labs: No results for input(s): PROCALCITON, LATICACIDVEN in the last 168 hours.  Recent Results (from the past 240 hour(s))  SARS CORONAVIRUS 2 (TAT 6-12 HRS) Nasal Swab Aptima Multi Swab     Status: None   Collection Time: 11/03/18 11:57 AM   Specimen: Aptima Multi  Swab; Nasal Swab  Result Value Ref Range Status   SARS Coronavirus 2 NEGATIVE NEGATIVE Final    Comment: (NOTE) SARS-CoV-2 target nucleic acids are NOT DETECTED. The SARS-CoV-2 RNA is generally detectable in upper and lower respiratory specimens during the acute phase of infection. Negative results do not preclude SARS-CoV-2 infection, do not rule out co-infections with other pathogens, and should not be used as the sole basis for treatment or other patient management decisions. Negative results must be combined with clinical observations, patient history, and epidemiological information. The expected result is Negative. Fact Sheet for Patients: SugarRoll.be Fact  Sheet for Healthcare Providers: https://www.woods-mathews.com/ This test is not yet approved or cleared by the [place] FDA and  has been authorized for detection and/or diagnosis of SARS-CoV-2 by FDA under an Emergency Use Authorization (EUA). This EUA will remain  in effect (meaning this test can be used) for the duration of the COVID-19 declaration under Section 56 4(b)(1) of the Act, 21 U.S.C. section 360bbb-3(b)(1), unless the authorization is terminated or revoked sooner. Performed at [facility], [place] [address] [place], [place] [address]   Culture, blood (Routine X 2) w Reflex to ID Panel     Status: None (Preliminary result)   Collection Time: 11/04/18 11:05 AM   Specimen: BLOOD RIGHT ARM  Result Value Ref Range Status   Specimen Description BLOOD RIGHT ARM  Final   Special Requests   Final    BOTTLES DRAWN AEROBIC AND ANAEROBIC Blood Culture adequate volume   Culture   Final    NO GROWTH 4 DAYS Performed at [facility], [place] [address] [place], [place] [address]    Report Status PENDING  Incomplete  Culture, blood (Routine X 2) w Reflex to ID Panel     Status: None (Preliminary result)   Collection Time: 11/04/18 11:09 AM   Specimen: BLOOD RIGHT HAND  Result Value Ref Range Status   Specimen Description BLOOD RIGHT HAND  Final   Special Requests   Final    BOTTLES DRAWN AEROBIC AND ANAEROBIC Blood Culture adequate volume   Culture   Final    NO GROWTH 4 DAYS Performed at [facility], [place] [address], [place], [place] [address]    Report Status PENDING  Incomplete         Radiology Studies: No results found.      Scheduled Meds: . atorvastatin  80 mg Oral Once  . carvedilol  3.125 mg Oral BID WC  . clopidogrel  75 mg Oral Daily  . feeding supplement (ENSURE ENLIVE)  237 mL Oral TID BM  . lisinopril  5 mg Oral Daily  . nicotine  21 mg Transdermal Daily  . tamsulosin  0.4 mg Oral QPC supper  . Warfarin - Pharmacist  Dosing Inpatient   Does not apply q1800   Continuous Infusions: . heparin 1,500 Units/hr (11/08/18 0817)     LOS: 5 days    Time spent: 35 mins.More than 50% of that time was spent in counseling and/or coordination of care.      Shelly Coss, MD [facility] Pager [phone]  If 7PM-7AM, please contact night-coverage [email or web address] Password [email or web address] 11/08/2018, 11:05 AM

## 2018-11-09 DIAGNOSIS — R972 Elevated prostate specific antigen [PSA]: Secondary | ICD-10-CM

## 2018-11-09 DIAGNOSIS — C61 Malignant neoplasm of prostate: Secondary | ICD-10-CM

## 2018-11-09 HISTORY — DX: Elevated prostate specific antigen (PSA): R97.20

## 2018-11-09 HISTORY — DX: Malignant neoplasm of prostate: C61

## 2018-11-09 LAB — CULTURE, BLOOD (ROUTINE X 2)
Culture: NO GROWTH
Culture: NO GROWTH
Special Requests: ADEQUATE
Special Requests: ADEQUATE

## 2018-11-09 LAB — CBC
HCT: 32.4 % — ABNORMAL LOW (ref 39.0–52.0)
Hemoglobin: 10.7 g/dL — ABNORMAL LOW (ref 13.0–17.0)
MCH: 26.3 pg (ref 26.0–34.0)
MCHC: 33 g/dL (ref 30.0–36.0)
MCV: 79.6 fL — ABNORMAL LOW (ref 80.0–100.0)
Platelets: 463 10*3/uL — ABNORMAL HIGH (ref 150–400)
RBC: 4.07 MIL/uL — ABNORMAL LOW (ref 4.22–5.81)
RDW: 17.1 % — ABNORMAL HIGH (ref 11.5–15.5)
WBC: 5.8 10*3/uL (ref 4.0–10.5)
nRBC: 0 % (ref 0.0–0.2)

## 2018-11-09 LAB — TROPONIN I (HIGH SENSITIVITY)
Troponin I (High Sensitivity): 21 ng/L — ABNORMAL HIGH (ref <18)
Troponin I (High Sensitivity): 23 ng/L — ABNORMAL HIGH (ref <18)

## 2018-11-09 LAB — PROTIME-INR
INR: 1.2 (ref 0.8–1.2)
Prothrombin Time: 14.8 seconds (ref 11.4–15.2)

## 2018-11-09 LAB — HEPARIN LEVEL (UNFRACTIONATED)
Heparin Unfractionated: 0.55 IU/mL (ref 0.30–0.70)
Heparin Unfractionated: 0.49 IU/mL (ref 0.30–0.70)

## 2018-11-09 MED ORDER — WARFARIN SODIUM 7.5 MG PO TABS
12.5000 mg | ORAL_TABLET | Freq: Once | ORAL | Status: AC
  Administered 2018-11-09: 17:00:00 12.5 mg via ORAL
  Filled 2018-11-09: qty 1

## 2018-11-09 NOTE — Progress Notes (Addendum)
ANTICOAGULATION CONSULT NOTE - Follow-Up Consult  Pharmacy Consult for Heparin and Warfarin Indication: thrombus and CVA  No Known Allergies  Patient Measurements: Height: 6\' 1"  (185.4 cm) Weight: 131 lb (59.4 kg) IBW/kg (Calculated) : 79.9 Heparin Dosing Weight: 59.4 kg  Vital Signs: Temp: 99.3 F (37.4 C) (09/01 0423) Temp Source: Oral (09/01 0423) BP: 124/84 (09/01 0423) Pulse Rate: 91 (09/01 0423)  Labs: Recent Labs    11/06/18 1404  11/07/18 0458 11/08/18 0426 11/09/18 0307  HGB  --    < > 9.4* 9.2* 10.7*  HCT  --   --  29.1* 27.7* 32.4*  PLT  --   --  375 372 463*  LABPROT  --   --  14.3 14.6 14.8  INR  --   --  1.1 1.2 1.2  HEPARINUNFRC  --   --  0.34 0.41 0.55  CREATININE 0.70  --  0.57*  --   --    < > = values in this interval not displayed.    Estimated Creatinine Clearance: 84.6 mL/min (A) (by C-G formula based on SCr of 0.57 mg/dL (L)).  Assessment: 29 yoM transferred from [facility] with CVA for CTA work up. 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 No tPa was given. No AC PTA. Kary Kos is currently held.   Patient is on IV Heparin and Warfarin bridge day #5 but INR remains subtherapeutic at 1.2. Heparin level is slightly supra-therapeutic this AM for low goal of 0.3 to 0.5 at 0.55. Will reduce slightly and increase Warfarin dose today.   CBC stable. No bleeding reported.   Goal of Therapy:  INR 2-3  HL 0.3-0.5 Monitor platelets by anticoagulation protocol: Yes   Plan:  Reduce heparin to 1400 units/hr. Warfarin 12.5 mg x 1 Daily INR, Heparin level, and CBC  Sloan Leiter, PharmD, BCPS, BCCCP Clinical Pharmacist Clinical phone 11/09/2018 until [phone] Please check AMION for all [place] numbers 11/09/2018 7:09 AM

## 2018-11-09 NOTE — TOC Initial Note (Signed)
Transition of Care [facility]) - Initial/Assessment Note    Patient Details  Name: Daniel Weiss MRN: [id number] Date of Birth: 13-Dec-1960  Transition of [facility]) CM/SW Contact:    Pollie Friar, RN Phone Number: 11/09/2018, 11:41 AM  Clinical Narrative:                 Pt currently lives alone but plans on moving in with his sister after d/c from the hospital. She will be able to provide some supervision at home.  Pt will need f/u at coumadin clinic after d/c. CM met with him and he has the transportation to come to [place] as necessary. CM will follow his INR and arrange for Coumadin appt closer to d/c.  Pt doesn't have PCP. CM will arrange for PCP appt closer to d/c.  TOC following for d/c needs.    Expected Discharge Plan: OP Rehab Barriers to Discharge: Continued Medical Work up   Patient Goals and CMS Choice     Choice offered to / list presented to : Patient  Expected Discharge Plan and Services Expected Discharge Plan: OP Rehab   Discharge Planning Services: CM Consult                                          Prior Living Arrangements/Services   Lives with:: Self Patient language and need for interpreter reviewed:: Yes(no needs) Do you feel safe going back to the place where you live?: Yes      Need for Family Participation in Patient Care: No (Comment) Care giver support system in place?: Yes (comment)(pt to move in with sister)   Criminal Activity/Legal Involvement Pertinent to Current Situation/Hospitalization: No - Comment as needed  Activities of Daily Living Home Assistive Devices/Equipment: None ADL Screening (condition at time of admission) Patient's cognitive ability adequate to safely complete daily activities?: Yes Is the patient deaf or have difficulty hearing?: No Does the patient have difficulty seeing, even when wearing glasses/contacts?: No Does the patient have difficulty concentrating, remembering, or making decisions?: No Patient  able to express need for assistance with ADLs?: Yes Does the patient have difficulty dressing or bathing?: No Independently performs ADLs?: Yes (appropriate for developmental age) Does the patient have difficulty walking or climbing stairs?: No Weakness of Legs: None Weakness of Arms/Hands: Left  Permission Sought/Granted                  Emotional Assessment Appearance:: Appears older than stated age Attitude/Demeanor/Rapport: Engaged Affect (typically observed): Accepting, Pleasant Orientation: : Oriented to Self, Oriented to Place, Oriented to  Time, Oriented to Situation   Psych Involvement: No (comment)  Admission diagnosis:  Left arm numbness [R20.0] Acute cardioembolic stroke [facility]) [E17.4] Patient Active Problem List   Diagnosis Date Noted  . Acute cardioembolic stroke ([place])   . Acute cerebrovascular accident (CVA) ([place]) 11/03/2018  . Presence of drug coated stent in LAD coronary artery 03/26/2018  . Complete left bundle branch block (LBBB) 03/26/2018  . Coronary artery disease involving native coronary artery of native heart with angina pectoris ([place]) 03/10/2018  . Hyperlipidemia LDL goal <70 03/10/2018  . Enlarged prostate 03/10/2018  . Abnormal CT scan 03/10/2018  . Ischemic cardiomyopathy 03/10/2018  . Hypokalemia   . Tobacco abuse   . Non-ST elevation (NSTEMI) myocardial infarction ([place]) 03/06/2018  . Postmyocardial infarction angina as complication of acute myocardial infarction ([place]) 03/06/2018  .  Sinus tachycardia 03/06/2018  . Encephalopathy, hypertensive   . ARF (acute renal failure) ([place]) 06/29/2015  . Hyponatremia 06/29/2015  . Dehydration 06/29/2015  . Prolonged QT interval 06/29/2015  . 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 H/O medication noncompliance   . Other headache syndrome   . Localized swelling, mass or lump of neck   . Neck mass   . History of TIA (transient ischemic attack)   . Paresthesia 07/07/2014  . Essential hypertension 07/07/2014  .  Right sided weakness 07/07/2014  . Mass of right side of neck 07/07/2014   PCP:  Patient, No Pcp Per Pharmacy:   [facility] [address]), [place] [address] [place] [address] Phone: [phone] Fax: [phone]  [place], [place] [address] Phone: [phone] Fax: [phone]     Social Determinants of Health (SDOH) Interventions    Readmission Risk Interventions No flowsheet data found.

## 2018-11-09 NOTE — Progress Notes (Signed)
ANTICOAGULATION CONSULT NOTE - Follow-Up Consult  Pharmacy Consult for Heparin and Warfarin Indication: thrombus and CVA  No Known Allergies  Patient Measurements: Height: 6\' 1"  (185.4 cm) Weight: 131 lb (59.4 kg) IBW/kg (Calculated) : 79.9 Heparin Dosing Weight: 59.4 kg  Vital Signs: Temp: 97.7 F (36.5 C) (09/01 1126) Temp Source: Oral (09/01 1126) BP: 116/93 (09/01 1126) Pulse Rate: 108 (09/01 1126)  Labs: Recent Labs    11/07/18 0458 11/08/18 0426 11/09/18 0307 11/09/18 1309  HGB 9.4* 9.2* 10.7*  --   HCT 29.1* 27.7* 32.4*  --   PLT 375 372 463*  --   LABPROT 14.3 14.6 14.8  --   INR 1.1 1.2 1.2  --   HEPARINUNFRC 0.34 0.41 0.55 0.49  CREATININE 0.57*  --   --   --     Estimated Creatinine Clearance: 84.6 mL/min (A) (by C-G formula based on SCr of 0.57 mg/dL (L)).  Assessment: 47 yoM transferred from [facility] with CVA for CTA work up. 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 No tPa was given. No AC PTA. Kary Kos is currently held.   Patient is on IV Heparin and Warfarin bridge day #5 but INR remains subtherapeutic at 1.2. CBC is stable and no bleeding reported.   PM update: Heparin level is now therapeutic for tight goal of 0.3 to 0.5 at level of 0.49 on 1400 units/hr. Increased dose of Warfarin ordered.   Goal of Therapy:  INR 2-3  HL 0.3-0.5 Monitor platelets by anticoagulation protocol: Yes   Plan:  Continue heparin to 1400 units/hr. Continue Warfarin 12.5 mg x 1 today at 1800 as previously ordered.  Daily INR, Heparin level, and CBC  Sloan Leiter, PharmD, BCPS, BCCCP Clinical Pharmacist Clinical phone 11/09/2018 until [phone] Please check AMION for all [place] numbers 11/09/2018 2:20 PM

## 2018-11-09 NOTE — Discharge Instructions (Signed)
Information on my medicine - Coumadin   (Warfarin)  This medication education was reviewed with me or my healthcare representative as part of my discharge preparation.  The pharmacist that spoke with me during my hospital stay was:  Brain Hilts, [facility]  Why was Coumadin prescribed for you? Coumadin was prescribed for you because you have a blood clot or a medical condition that can cause an increased risk of forming blood clots. Blood clots can cause serious health problems by blocking the flow of blood to the heart, lung, or brain. Coumadin can prevent harmful blood clots from forming. As a reminder your indication for Coumadin is:  Stroke prevention in setting of atrial thrombus  What test will check on my response to Coumadin? While on Coumadin (warfarin) you will need to have an INR test regularly to ensure that your dose is keeping you in the desired range. The INR (international normalized ratio) number is calculated from the result of the laboratory test called prothrombin time (PT).  If an INR APPOINTMENT HAS NOT ALREADY BEEN MADE FOR YOU please schedule an appointment to have this lab work done by your health care provider within 7 days. Your INR goal is usually a number between:  2 to 3 or your provider may give you a more narrow range like 2-2.5.  Ask your health care provider during an office visit what your goal INR is.  What  do you need to  know  About  COUMADIN? Take Coumadin (warfarin) exactly as prescribed by your healthcare provider about the same time each day.  DO NOT stop taking without talking to the doctor who prescribed the medication.  Stopping without other blood clot prevention medication to take the place of Coumadin may increase your risk of developing a new clot or stroke.  Get refills before you run out.  What do you do if you miss a dose? If you miss a dose, take it as soon as you remember on the same day then continue your regularly scheduled regimen the  next day.  Do not take two doses of Coumadin at the same time.  Important Safety Information A possible side effect of Coumadin (Warfarin) is an increased risk of bleeding. You should call your healthcare provider right away if you experience any of the following: ? Bleeding from an injury or your nose that does not stop. ? Unusual colored urine (red or dark brown) or unusual colored stools (red or black). ? Unusual bruising for unknown reasons. ? A serious fall or if you hit your head (even if there is no bleeding).  Some foods or medicines interact with Coumadin (warfarin) and might alter your response to warfarin. To help avoid this: ? Eat a balanced diet, maintaining a consistent amount of Vitamin K. ? Notify your provider about major diet changes you plan to make. ? Avoid alcohol or limit your intake to 1 drink for women and 2 drinks for men per day. (1 drink is 5 oz. wine, 12 oz. beer, or 1.5 oz. liquor.)  Make sure that ANY health care provider who prescribes medication for you knows that you are taking Coumadin (warfarin).  Also make sure the healthcare provider who is monitoring your Coumadin knows when you have started a new medication including herbals and non-prescription products.  Coumadin (Warfarin)  Major Drug Interactions  Increased Warfarin Effect Decreased Warfarin Effect  Alcohol (large quantities) Antibiotics (esp. Septra/Bactrim, Flagyl, Cipro) Amiodarone (Cordarone) Aspirin (ASA) Cimetidine (Tagamet) Megestrol (Megace) NSAIDs (ibuprofen,  naproxen, etc.) °Piroxicam (Feldene) °Propafenone (Rythmol SR) °Propranolol (Inderal) °Isoniazid (INH) °Posaconazole (Noxafil) Barbiturates (Phenobarbital) °Carbamazepine (Tegretol) °Chlordiazepoxide (Librium) °Cholestyramine (Questran) °Griseofulvin °Oral Contraceptives °Rifampin °Sucralfate (Carafate) °Vitamin K  ° °Coumadin® (Warfarin) Major Herbal Interactions  °Increased Warfarin Effect Decreased Warfarin Effect   °Garlic °Ginseng °Ginkgo biloba Coenzyme Q10 °Green tea °St. John’s wort   ° °Coumadin® (Warfarin) FOOD Interactions  °Eat a consistent number of servings per week of foods HIGH in Vitamin K °(1 serving = ½ cup)  °Collards (cooked, or boiled & drained) °Kale (cooked, or boiled & drained) °Mustard greens (cooked, or boiled & drained) °Parsley *serving size only = ¼ cup °Spinach (cooked, or boiled & drained) °Swiss chard (cooked, or boiled & drained) °Turnip greens (cooked, or boiled & drained)  °Eat a consistent number of servings per week of foods MEDIUM-HIGH in Vitamin K °(1 serving = 1 cup)  °Asparagus (cooked, or boiled & drained) °Broccoli (cooked, boiled & drained, or raw & chopped) °Brussel sprouts (cooked, or boiled & drained) *serving size only = ½ cup °Lettuce, raw (green leaf, endive, romaine) °Spinach, raw °Turnip greens, raw & chopped  ° °These websites have more information on Coumadin (warfarin):  www.coumadin.com; °www.ahrq.gov/consumer/coumadin.htm; ° ° °

## 2018-11-09 NOTE — Progress Notes (Signed)
PROGRESS NOTE    Daniel Weiss  [id number] DOB: September 17, 1960 DOA: 11/03/2018 PCP: Patient, No Pcp Per   Brief Narrative:  Patient is a 58 year old male with history of coronary artery disease status post cardiac cath with angioplasty, ischemic cardiomyopathy with ejection fraction of 35 to 40%, hypertension, tobacco use, hyperlipidemia who presented at [facility] with complaints of left arm tingling, weakness, chest pain.  Imaging suggested acute ischemic stroke, possible clot in the distal ascending aorta and also possible left ventricle. CT coronary showed non-obstructing plaque in all coronary distributions,intimal irregularity with filling defect in the ascending aorta but no LV thrombus. Imagings also suggested  metastatic prostate cancer. Patient transferred to [facility].  He was seen by cardiology, neurology, urology here .He  Was started on coumadin with heparin bridge for ascending aorta thrombus. Plan for discharge after INR reaches 2-3.Very slow rise in INR.  Assessment & Plan:   Principal Problem:   Acute cerebrovascular accident (CVA) ([place]) Active Problems:   Paresthesia   Essential hypertension   Hyponatremia   Hypokalemia   Tobacco abuse   Coronary artery disease involving native coronary artery of native heart with angina pectoris (HCC)   Hyperlipidemia LDL goal <70   Enlarged prostate   Ischemic cardiomyopathy   Acute cardioembolic stroke ([place])   Acute CVA: MRI showed right-sided frontoparietal infarcts.  Picture suggested embolic nature.  Imaging showed thrombus in the ascending aorta . Neurology was following.  Continue plavix, statin,coumadin. Currently does not have any focal neurological deficits.  He presented with left-sided weakness and tingling/numbness. PT/OT evaluation done.No follow up recommended. HbA1c of 5.3. LDL of 67. Follow up with [facility] neurology as an outpatient in 4 weeks after discharge.  Ascending aorta thrombus: Currently on  warfarin and heparin drip.Continue bridging until INR between 2-3 .CT coronary showed non-obstructing plaque in all coronary distributions,intimal irregularity with filling defect in the ascending aorta but no LV thrombus. I have requested case manager for arranging a follow-up for INR as an outpatient.  Also discussed with pharmacy about increasing the dose of warfarin.  Coronary artery disease: Denies any chest pain at present.  Status post LAD stenting.  Was on aspirin and Brilinta at home.  Currently on Plavix.  Systolic CHF: Echocardiogram showed ejection fraction of 35 to 40%, impaired left ventricular relaxation.    Currently he is euvolemic.Started on carvedilol .  Continue  lisinopril at 5 mg daily.Follow up with cardiology as an outpatient.  Prostate cancer with osseous metastasis: Incidental finding on CT scan.   Very high PSA.  Imagings also showed  bone metastasis.    Started on tamsulosin.  Urology was  following.Bone can showed solitary punctate focus of activity in the LEFT first sacral segment corresponding to a 9 mm mixed lytic and sclerotic metastasis on the recent CT.He will follow-up with urology for treatment as an outpatient.  Hyperlipidemia: Continue high dose statin  Hypertension: Currently blood stable.  Continue current medicines  Tobacco abuse: Counseled cessation.  Nicotine patch ordered.  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  No new complaints or issues.  Comfortable, eating his breakfast.  He requested me to call his sister.  Objective: Vitals:   11/09/18 0044 11/09/18 0423 11/09/18 0839 11/09/18 0839  BP: 111/82 124/84 (!) 136/95 (!) 136/95  Pulse: 93 91 89 89  Resp: 16 18 15 15   Temp: 99 F (37.2 C) 99.3 F (37.4 C) 98.8 F (37.1 C) 98.8 F (37.1 C)  TempSrc: Oral Oral Oral Oral  SpO2: 100% 99% 100% 100%  Weight:      Height:        Intake/Output Summary (Last 24 hours) at 11/09/2018 1113 Last data filed at 11/09/2018 0841 Gross per 24 hour  Intake 332.06 ml  Output 4525 ml  Net -4192.94 ml   Filed Weights   11/03/18 1121  Weight: 59.4 kg    Examination:  General exam: Appears calm and comfortable ,Not in distress,average built HEENT:PERRL,Oral mucosa moist, Ear/Nose normal on gross exam Respiratory system: Bilateral equal air entry, normal vesicular breath sounds, no wheezes or crackles  Cardiovascular system: S1 & S2 heard, RRR. No JVD, murmurs, rubs, gallops or clicks. Gastrointestinal system: Abdomen is nondistended, soft and nontender. No organomegaly or masses felt. Normal bowel sounds heard. Central nervous system: Alert and oriented. No focal neurological deficits. Extremities: No edema, no clubbing ,no cyanosis, distal peripheral pulses palpable. Skin: No rashes, lesions or ulcers,no icterus ,no pallor    Data Reviewed: I have personally reviewed following labs and imaging studies  CBC: Recent Labs  Lab 11/03/18 1140  11/05/18 0454 11/06/18 0512 11/07/18 0458 11/08/18 0426 11/09/18 0307  WBC 8.0   < > 6.4 6.4 6.3 6.4 5.8  NEUTROABS 4.7  --   --   --   --   --   --   HGB 10.6*   < > 10.2* 9.4* 9.4* 9.2* 10.7*  HCT 32.6*   < > 30.2* 27.9* 29.1* 27.7* 32.4*  MCV 81.1   < > 79.1* 79.3* 81.7 79.1* 79.6*  PLT 467*   < > 420* 403* 375 372 463*   < > = values in this interval not displayed.   Basic Metabolic Panel: Recent Labs  Lab 11/03/18 1140 11/06/18  1404 11/07/18 0458  NA 134* 137 138  K 3.8 3.4* 4.4  CL 100 104 108  CO2 24 25 21*  GLUCOSE 107* 127* 86  BUN 5* <5* 5*  CREATININE 0.77 0.70 0.57*  CALCIUM 8.6* 8.5* 8.6*  MG  --  1.9  --    GFR: Estimated Creatinine Clearance: 84.6 mL/min (A) (by C-G formula based on SCr of 0.57 mg/dL (L)). Liver Function Tests: Recent Labs  Lab 11/03/18 1140  AST 16  ALT 9  ALKPHOS 84  BILITOT 0.4  PROT 6.6  ALBUMIN 2.9*   No results for input(s): LIPASE, AMYLASE in the last 168 hours. No results for input(s): AMMONIA in the last 168 hours. Coagulation Profile: Recent Labs  Lab 11/05/18 1140 11/06/18 0512 11/07/18 0458 11/08/18 0426 11/09/18 0307  INR 1.1 1.1 1.1 1.2 1.2   Cardiac Enzymes: No results for input(s): CKTOTAL, CKMB, CKMBINDEX, TROPONINI in the last 168 hours. BNP (last 3 results) No results for input(s): PROBNP in the last 8760 hours. HbA1C: No results for input(s): HGBA1C in the last 72 hours. CBG: Recent Labs  Lab 11/03/18 1123  GLUCAP 114*   Lipid Profile: No results for input(s): CHOL, HDL, LDLCALC, TRIG, CHOLHDL, LDLDIRECT in the last 72 hours. Thyroid Function  Tests: No results for input(s): TSH, T4TOTAL, FREET4, T3FREE, THYROIDAB in the last 72 hours. Anemia Panel: No results for input(s): VITAMINB12, FOLATE, FERRITIN, TIBC, IRON, RETICCTPCT in the last 72 hours. Sepsis Labs: No results for input(s): PROCALCITON, LATICACIDVEN in the last 168 hours.  Recent Results (from the past 240 hour(s))  SARS CORONAVIRUS 2 (TAT 6-12 HRS) Nasal Swab Aptima Multi Swab     Status: None   Collection Time: 11/03/18 11:57 AM   Specimen: Aptima Multi Swab; Nasal Swab  Result Value Ref Range Status   SARS Coronavirus 2 NEGATIVE NEGATIVE Final    Comment: (NOTE) SARS-CoV-2 target nucleic acids are NOT DETECTED. The SARS-CoV-2 RNA is generally detectable in upper and lower respiratory specimens during the acute phase of infection. Negative results do not preclude  SARS-CoV-2 infection, do not rule out co-infections with other pathogens, and should not be used as the sole basis for treatment or other patient management decisions. Negative results must be combined with clinical observations, patient history, and epidemiological information. The expected result is Negative. Fact Sheet for Patients: SugarRoll.be Fact Sheet for Healthcare Providers: https://www.woods-mathews.com/ This test is not yet approved or cleared by the [place] FDA and  has been authorized for detection and/or diagnosis of SARS-CoV-2 by FDA under an Emergency Use Authorization (EUA). This EUA will remain  in effect (meaning this test can be used) for the duration of the COVID-19 declaration under Section 56 4(b)(1) of the Act, 21 U.S.C. section 360bbb-3(b)(1), unless the authorization is terminated or revoked sooner. Performed at [facility], [place] [address], [place], [place] [address]   Culture, blood (Routine X 2) w Reflex to ID Panel     Status: None   Collection Time: 11/04/18 11:05 AM   Specimen: BLOOD RIGHT ARM  Result Value Ref Range Status   Specimen Description BLOOD RIGHT ARM  Final   Special Requests   Final    BOTTLES DRAWN AEROBIC AND ANAEROBIC Blood Culture adequate volume   Culture   Final    NO GROWTH 5 DAYS Performed at [facility], [place] [address] [place], [place] [address]    Report Status 11/09/2018 FINAL  Final  Culture, blood (Routine X 2) w Reflex to ID Panel     Status: None   Collection Time: 11/04/18 11:09 AM   Specimen: BLOOD RIGHT HAND  Result Value Ref Range Status   Specimen Description BLOOD RIGHT HAND  Final   Special Requests   Final    BOTTLES DRAWN AEROBIC AND ANAEROBIC Blood Culture adequate volume   Culture   Final    NO GROWTH 5 DAYS Performed at [facility], [place] [address], [place], [place] [address]    Report Status 11/09/2018 FINAL  Final          Radiology Studies: No results found.      Scheduled Meds: . atorvastatin  80 mg Oral Once  . carvedilol  3.125 mg Oral BID WC  . clopidogrel  75 mg Oral Daily  . feeding supplement (ENSURE ENLIVE)  237 mL Oral TID BM  . lisinopril  5 mg Oral Daily  . nicotine  21 mg Transdermal Daily  . tamsulosin  0.4 mg Oral QPC supper  . warfarin  12.5 mg Oral ONCE-1800  . Warfarin - Pharmacist Dosing Inpatient   Does not apply q1800   Continuous Infusions: . heparin 1,400 Units/hr (11/09/18 0733)     LOS: 6 days    Time spent: 35  mins.More than 50% of that time was spent in counseling and/or coordination of care.      Shelly Coss, MD [facility] Pager [phone]  If 7PM-7AM, please contact night-coverage [email or web address] Password [email or web address] 11/09/2018, 11:13 AM

## 2018-11-09 NOTE — Progress Notes (Signed)
Occupational Therapy Treatment and Discharge Patient Details Name: Daniel Weiss MRN: [id number] DOB: [birth year] Today's Date: 11/09/2018    History of present illness Pt is a  58 yo male Admitted secondary to LUE numbness.  MRI: Small acute right frontoparietal infarcts. CT angio revealing dissection vs clot and possible metastic dx of prostate with multiple sclerotic bone lesions. PMHX: HLD, HLD, s/p cardiac angio.   OT comments  Engaged in self-care retraining tasks with focus on LB dressing and toileting hygiene with pt completing all at overall Mod I level.  Pt ambulated to tub/shower in therapy gym without AD at Mod I level and completed transfer stepping over ledge without assistance.  Pt demonstrating increased safety awareness with functional tasks as well as in discussion of overall health and wellbeing.    Pt d/c from OT services at this time as he has met all goals at Mod I level.  Follow Up Recommendations  No OT follow up    Equipment Recommendations  None recommended by OT       Precautions / Restrictions Precautions Precautions: Fall Restrictions Weight Bearing Restrictions: No       Mobility Bed Mobility Overal bed mobility: Modified Independent                Transfers Overall transfer level: Modified independent Equipment used: None Transfers: Sit to/from [facility] Sit to Stand: Modified independent (Device/Increase time) Stand pivot transfers: Modified independent (Device/Increase time)                ADL either performed or assessed with clinical judgement   ADL Overall ADL's : At baseline                                       General ADL Comments: Completed toileting task and LB dressing at overall Mod I level this session. Pt with no c/o dizziness this session.  Completed tub/shower transfers in therapy gym stepping over tub ledge without assistance.  Pt demonstrating improved safety awareness, even  stating that he would like to return to work but understands that he won't be and now wants to focus on his health.     Vision Baseline Vision/History: No visual deficits Vision Assessment?: No apparent visual deficits          Cognition Arousal/Alertness: Awake/alert Behavior During Therapy: WFL for tasks assessed/performed Overall Cognitive Status: Within Functional Limits for tasks assessed                                 General Comments: demonstrating improving awareness with reports of not planning to return to work but to focus on his health and stating that his sister will be staying with him upon d/c.                   Pertinent Vitals/ Pain       Pain Assessment: No/denies pain      Progress Toward Goals  OT Goals(current goals can now be found in the care plan section)  Progress towards OT goals: Goals met/education completed, patient discharged from OT  Acute Rehab OT Goals OT Goal Formulation: All assessment and education complete, DC therapy  Plan Discharge plan remains appropriate       AM-PAC OT "6 Clicks" Daily Activity     Outcome Measure   Help from  another person eating meals?: None Help from another person taking care of personal grooming?: None Help from another person toileting, which includes using toliet, bedpan, or urinal?: None Help from another person bathing (including washing, rinsing, drying)?: None Help from another person to put on and taking off regular upper body clothing?: None Help from another person to put on and taking off regular lower body clothing?: None 6 Click Score: 24    End of Session Equipment Utilized During Treatment: Gait belt  OT Visit Diagnosis: Unsteadiness on feet (R26.81);Muscle weakness (generalized) (M62.81)   Activity Tolerance Patient tolerated treatment well   Patient Left in chair;with call bell/phone within reach;with chair alarm set   Nurse Communication Mobility status         Time: 7939-0300 OT Time Calculation (min): 23 min  Charges: OT General Charges $OT Visit: 1 Visit OT Treatments $Self Care/Home Management : 23-37 mins    Simonne Come, [phone] 11/09/2018, 11:17 AM

## 2018-11-10 LAB — HEPARIN LEVEL (UNFRACTIONATED): Heparin Unfractionated: 0.42 IU/mL (ref 0.30–0.70)

## 2018-11-10 LAB — CBC
HCT: 34.8 % — ABNORMAL LOW (ref 39.0–52.0)
Hemoglobin: 11.6 g/dL — ABNORMAL LOW (ref 13.0–17.0)
MCH: 26.1 pg (ref 26.0–34.0)
MCHC: 33.3 g/dL (ref 30.0–36.0)
MCV: 78.4 fL — ABNORMAL LOW (ref 80.0–100.0)
Platelets: 471 10*3/uL — ABNORMAL HIGH (ref 150–400)
RBC: 4.44 MIL/uL (ref 4.22–5.81)
RDW: 17 % — ABNORMAL HIGH (ref 11.5–15.5)
WBC: 5.9 10*3/uL (ref 4.0–10.5)
nRBC: 0 % (ref 0.0–0.2)

## 2018-11-10 LAB — PROTIME-INR
INR: 1.3 — ABNORMAL HIGH (ref 0.8–1.2)
Prothrombin Time: 16.1 seconds — ABNORMAL HIGH (ref 11.4–15.2)

## 2018-11-10 MED ORDER — WARFARIN SODIUM 7.5 MG PO TABS
15.0000 mg | ORAL_TABLET | Freq: Once | ORAL | Status: AC
  Administered 2018-11-10: 17:00:00 15 mg via ORAL
  Filled 2018-11-10: qty 2

## 2018-11-10 MED ORDER — CARVEDILOL 6.25 MG PO TABS
6.2500 mg | ORAL_TABLET | Freq: Two times a day (BID) | ORAL | Status: DC
  Administered 2018-11-10 – 2018-11-12 (×4): 6.25 mg via ORAL
  Filled 2018-11-10 (×4): qty 1

## 2018-11-10 NOTE — Progress Notes (Signed)
ANTICOAGULATION CONSULT NOTE  Pharmacy Consult:  Heparin / Coumadin Indication: thrombus and CVA  No Known Allergies  Patient Measurements: Height: 6\' 1"  (185.4 cm) Weight: 131 lb (59.4 kg) IBW/kg (Calculated) : 79.9 Heparin Dosing Weight: 59.4 kg  Vital Signs: Temp: 98.2 F (36.8 C) (09/02 1135) Temp Source: Oral (09/02 1135) BP: 116/93 (09/02 1135) Pulse Rate: 101 (09/02 1135)  Labs: Recent Labs    11/08/18 0426 11/09/18 0307 11/09/18 1309 11/09/18 1702 11/09/18 1820 11/10/18 0316  HGB 9.2* 10.7*  --   --   --  11.6*  HCT 27.7* 32.4*  --   --   --  34.8*  PLT 372 463*  --   --   --  471*  LABPROT 14.6 14.8  --   --   --  16.1*  INR 1.2 1.2  --   --   --  1.3*  HEPARINUNFRC 0.41 0.55 0.49  --   --  0.42  TROPONINIHS  --   --   --  21* 23*  --     Estimated Creatinine Clearance: 84.6 mL/min (A) (by C-G formula based on SCr of 0.57 mg/dL (L)).  Assessment: 35 yoM [facility] with CVA for CTA work up. 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 No tPa was given. No AC PTA. Daniel Weiss is currently held.   Patient is on IV heparin and Coumadin bridge day #6 but INR remains subtherapeutic at 1.3. Heparin level is therapeutic and stable.  CBC is stable and no bleeding reported.   Goal of Therapy:  INR 2-3  Heparin level 0.3-0.5 units/mL Monitor platelets by anticoagulation protocol: Yes   Plan:  Continue heparin gtt at 1400 units/hr Coumadin 15mg  PO today Daily heparin level, PT / INR and CBC  Daniel Weiss, PharmD, BCPS, [place] 11/10/2018, 12:50 PM

## 2018-11-10 NOTE — Progress Notes (Signed)
PROGRESS NOTE    Daniel Weiss  [id number] DOB: 04-Nov-1960 DOA: 11/03/2018 PCP: Patient, No Pcp Per    Brief Narrative:   Daniel Weiss is a 58 year old male with history of coronary artery disease status post cardiac cath with angioplasty, ischemic cardiomyopathy with ejection fraction of 35 to 40%, hypertension, tobacco use, hyperlipidemia who presented at [facility] with complaints of left arm tingling, weakness, chest pain.  Imaging suggested acute ischemic stroke, possible clot in the distal ascending aorta and also possible left ventricle. CT coronary showed non-obstructing plaque in all coronary distributions,intimal irregularity with filling defect in the ascending aorta but no LV thrombus. Imagings also suggested  metastatic prostate cancer. Patient transferred to [facility].  He was seen by cardiology, neurology, urology here. He  Was started on coumadin with heparin bridge for ascending aorta thrombus. Plan for discharge after INR reaches 2-3.Very slow rise in INR.   Assessment & Plan:   Principal Problem:   Acute cerebrovascular accident (CVA) ([place]) Active Problems:   Paresthesia   Essential hypertension   Hyponatremia   Hypokalemia   Tobacco abuse   Coronary artery disease involving native coronary artery of native heart with angina pectoris (HCC)   Hyperlipidemia LDL goal <70   Enlarged prostate   Ischemic cardiomyopathy   Acute cardioembolic stroke ([facility])   Acute CVA:  Patient presenting with left-sided weakness with associated paresthesias/numbness. MRI showed right-sided frontoparietal infarcts.  Picture suggested embolic nature.  Imaging showed thrombus in the ascending aorta . Neurology was consulted.    Neurological deficits have since resolved. --HbA1c of 5.3. LDL 67. --PT/OT evaluation completed, no follow up recommended. --Continue plavix, statin,coumadin. --Continue monitor on telemetry --Follow up with [facility] neurology as an outpatient in 4 weeks  after discharge.  Ascending aorta thrombus:  CT coronary showed non-obstructing plaque in all coronary distributions,intimal irregularity with filling defect in the ascending aorta but no LV thrombus. --Continue warfarin with heparin drip until INR 2-3; pharmacy assisting with dosing/monitoring --Case management to assist with outpatient follow-up for management of Coumadin  Coronary artery disease:  Denies any chest pain at present.  Status post LAD stenting 03/06/2018.  Was on aspirin and Brilinta at home.  Evaluated by cardiology, Brilinta and aspirin now has been discontinued. --Continue Plavix and Coumadin  Chronic Systolic CHF:  Echocardiogram showed ejection fraction of 35 to 40%, impaired left ventricular relaxation.    --Continue carvedilol, increased to 6.25 mg p.o. twice daily today --Lisinopril 5 mg p.o. daily --Strict I's and O's and daily weights --Outpatient cardiology follow-up with Dr. Ellyn Hack  Prostate cancer with osseous metastases CT notable for multiple sclerotic bone lesions with a PSA of 500.  Patient was seen by urology in consultation on 11/04/2018; Nicolette Bang.  Bone scan notable for solitary focus of activity left first sacral segment corresponding to a 9 mm mixed lytic and sclerotic metastases. --Even start of anticoagulation for acute CVA and ascending aortic thrombus, biopsy deferred and urology plans to start therapy for metastatic prostate cancer outpatient.  Hyperlipidemia: Continue high dose statin  Hypertension: Currently blood stable.  Continue current medicines  Tobacco abuse: Counseled cessation.  Nicotine patch ordered.  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   Consultants:   Urology, Dr. Alyson Ingles  Cardiology  Neurology  Cardiothoracic surgery  Procedures:  TTE 11/04/2018: IMPRESSIONS    1. Mild dyskinesis of the left ventricular, entire apical segment.  2. Severe hypokinesis of the left ventricular, mid-apical anteroseptal wall and anterior wall.  3. The left ventricle has moderately reduced systolic function, with an ejection fraction of 35-40%. The cavity size was normal. There is moderate concentric left ventricular hypertrophy. Left ventricular diastolic Doppler parameters are consistent with  impaired relaxation. There is abnormal septal motion consistent with left bundle branch block. No evidence of left ventricular regional wall motion abnormalities.  4. The right ventricle has normal systolic function. The cavity was normal. There is no increase in right ventricular wall thickness. Right ventricular systolic pressure could not be assessed.  5. Sclerosis without any evidence of stenosis of the aortic valve. Aortic valve regurgitation is trivial by color flow Doppler.  6. The aorta is normal unless otherwise noted.  7. The aortic root is normal in size and structure.  8. When compared to the prior study: 08/16/2018, no significant change is seen.  NM Bone Scan 11/05/2018: IMPRESSION: 1. Solitary punctate focus of activity in the LEFT first sacral segment corresponding to a 9 mm mixed lytic and sclerotic metastasis on the recent CT. 2. The remaining mom numerous sclerotic osseous metastases identified on CT do not demonstrate abnormal activity and are therefore healed.  Antimicrobials:  none   Subjective: Patient seen and examined at bedside, resting comfortably.  Eating breakfast.  No specific complaints this morning.  Continues on heparin drip with subtherapeutic INR.  States he felt dizzy yesterday  with standing, states today symptoms have resolved.  No other complaints or concerns at this time.  Denies headache, no fever/chills/night sweats, no chest pain, no palpitations, no shortness of breath, no abdominal pain, no weakness, no paresthesias, no cough/congestion, no issues with bowel/bladder function.  No acute events overnight per nursing staff.  Objective: Vitals:   11/10/18 0015 11/10/18 0345 11/10/18 0812 11/10/18 1135  BP: 131/79 107/83 (!) 124/96 (!) 116/93  Pulse: 96 (!) 101 91 (!) 101  Resp: 20 19 20 16   Temp: 98.2 F (36.8 C) 97.8 F (36.6 C) 98.3 F (36.8 C) 98.2 F (36.8 C)  TempSrc: Oral Oral Oral Oral  SpO2: 100% 99% 100% 97%  Weight:      Height:        Intake/Output Summary (Last 24 hours) at 11/10/2018 1146 Last data filed at 11/10/2018 0812 Gross per 24 hour  Intake 480 ml  Output 1700 ml  Net -1220 ml   Filed Weights   11/03/18 1121  Weight: 59.4 kg    Examination:  General exam: Appears calm and comfortable  Respiratory system: Clear to auscultation. Respiratory effort normal. Cardiovascular system: Tachycardic, regular rhythm, S1 & S2 heard. No JVD, murmurs, rubs, gallops or clicks. No pedal edema. Gastrointestinal system: Abdomen is nondistended, soft and nontender. No organomegaly or masses felt. Normal bowel sounds heard. Central nervous system: Alert and oriented. No focal neurological deficits. Extremities: Symmetric 5 x 5 power. Skin: No rashes, lesions or ulcers Psychiatry: Judgement and insight appear normal. Mood & affect appropriate.     Data Reviewed: I have personally reviewed following labs and imaging studies  CBC: Recent Labs  Lab 11/06/18 0512 11/07/18 0458 11/08/18 0426 11/09/18 0307 11/10/18 0316  WBC 6.4 6.3 6.4 5.8 5.9  HGB 9.4* 9.4* 9.2* 10.7* 11.6*  HCT 27.9* 29.1* 27.7*  32.4* 34.8*  MCV 79.3* 81.7 79.1* 79.6* 78.4*  PLT 403* 375 372 463* [id number]*   Basic Metabolic Panel: Recent Labs  Lab 11/06/18 1404 11/07/18  0458  NA 137 138  K 3.4* 4.4  CL 104 108  CO2 25 21*  GLUCOSE 127* 86  BUN <5* 5*  CREATININE 0.70 0.57*  CALCIUM 8.5* 8.6*  MG 1.9  --    GFR: Estimated Creatinine Clearance: 84.6 mL/min (A) (by C-G formula based on SCr of 0.57 mg/dL (L)). Liver Function Tests: No results for input(s): AST, ALT, ALKPHOS, BILITOT, PROT, ALBUMIN in the last 168 hours. No results for input(s): LIPASE, AMYLASE in the last 168 hours. No results for input(s): AMMONIA in the last 168 hours. Coagulation Profile: Recent Labs  Lab 11/06/18 0512 11/07/18 0458 11/08/18 0426 11/09/18 0307 11/10/18 0316  INR 1.1 1.1 1.2 1.2 1.3*   Cardiac Enzymes: No results for input(s): CKTOTAL, CKMB, CKMBINDEX, TROPONINI in the last 168 hours. BNP (last 3 results) No results for input(s): PROBNP in the last 8760 hours. HbA1C: No results for input(s): HGBA1C in the last 72 hours. CBG: No results for input(s): GLUCAP in the last 168 hours. Lipid Profile: No results for input(s): CHOL, HDL, LDLCALC, TRIG, CHOLHDL, LDLDIRECT in the last 72 hours. Thyroid Function Tests: No results for input(s): TSH, T4TOTAL, FREET4, T3FREE, THYROIDAB in the last 72 hours. Anemia Panel: No results for input(s): VITAMINB12, FOLATE, FERRITIN, TIBC, IRON, RETICCTPCT in the last 72 hours. Sepsis Labs: No results for input(s): PROCALCITON, LATICACIDVEN in the last 168 hours.  Recent Results (from the past 240 hour(s))  SARS CORONAVIRUS 2 (TAT 6-12 HRS) Nasal Swab Aptima Multi Swab     Status: None   Collection Time: 11/03/18 11:57 AM   Specimen: Aptima Multi Swab; Nasal Swab  Result Value Ref Range Status   SARS Coronavirus 2 NEGATIVE NEGATIVE Final    Comment: (NOTE) SARS-CoV-2 target nucleic acids are NOT DETECTED. The SARS-CoV-2 RNA is generally detectable in upper and lower respiratory specimens during the acute phase of infection. Negative results do not preclude SARS-CoV-2 infection, do not rule out co-infections with other  pathogens, and should not be used as the sole basis for treatment or other patient management decisions. Negative results must be combined with clinical observations, patient history, and epidemiological information. The expected result is Negative. Fact Sheet for Patients: SugarRoll.be Fact Sheet for Healthcare Providers: https://www.woods-mathews.com/ This test is not yet approved or cleared by the [place] FDA and  has been authorized for detection and/or diagnosis of SARS-CoV-2 by FDA under an Emergency Use Authorization (EUA). This EUA will remain  in effect (meaning this test can be used) for the duration of the COVID-19 declaration under Section 56 4(b)(1) of the Act, 21 U.S.C. section 360bbb-3(b)(1), unless the authorization is terminated or revoked sooner. Performed at [facility], [place] [address], [place], [place] [address]   Culture, blood (Routine X 2) w Reflex to ID Panel     Status: None   Collection Time: 11/04/18 11:05 AM   Specimen: BLOOD RIGHT ARM  Result Value Ref Range Status   Specimen Description BLOOD RIGHT ARM  Final   Special Requests   Final    BOTTLES DRAWN AEROBIC AND ANAEROBIC Blood Culture adequate volume   Culture   Final    NO GROWTH 5 DAYS Performed at [facility], [place] [address] [place], [place] [address]    Report Status 11/09/2018 FINAL  Final  Culture, blood (Routine  X 2) w Reflex to ID Panel     Status: None   Collection Time: 11/04/18 11:09 AM   Specimen: BLOOD RIGHT HAND  Result Value Ref Range Status   Specimen Description BLOOD RIGHT HAND  Final   Special Requests   Final    BOTTLES DRAWN AEROBIC AND ANAEROBIC Blood Culture adequate volume   Culture   Final    NO GROWTH 5 DAYS Performed at [facility], [address], [place], [place] [address]    Report Status 11/09/2018 FINAL  Final         Radiology Studies: No results found.      Scheduled Meds: .  atorvastatin  80 mg Oral Once  . carvedilol  6.25 mg Oral BID WC  . clopidogrel  75 mg Oral Daily  . feeding supplement (ENSURE ENLIVE)  237 mL Oral TID BM  . lisinopril  5 mg Oral Daily  . nicotine  21 mg Transdermal Daily  . tamsulosin  0.4 mg Oral QPC supper  . Warfarin - Pharmacist Dosing Inpatient   Does not apply q1800   Continuous Infusions: . heparin 1,400 Units/hr (11/10/18 0648)     LOS: 7 days    Time spent: 29 minutes spent on chart review, discussion with nursing staff, consultants, personally reviewing all imaging studies and labs, updating family and interview/physical exam; more than 50% of that time was spent in counseling and/or coordination of care.    Jeroline Wolbert J [place] DO [facility] Pager [phone]  If 7PM-7AM, please contact night-coverage [email or web address] Password [email or web address] 11/10/2018, 11:46 AM

## 2018-11-10 NOTE — TOC Benefit Eligibility Note (Signed)
Transition of Care [facility]) Benefit Eligibility Note    Patient Details  Name: [name] MRN: [id number] Date of Birth: 1960/05/12   Medication/Dose: Carron Curie 46 MG SYRINGES                       Additional Notes: PATIENT HAS MEDICAID [place] ACCESS, EFF-DATE 06-09-2018, CO-PAY- $ 3.90 FOR EACH PRESCRIPTION    Memory Argue Phone Number: 11/10/2018, 5:02 PM

## 2018-11-11 DIAGNOSIS — E440 Moderate protein-calorie malnutrition: Secondary | ICD-10-CM | POA: Insufficient documentation

## 2018-11-11 HISTORY — DX: Moderate protein-calorie malnutrition: E44.0

## 2018-11-11 LAB — BASIC METABOLIC PANEL
Anion gap: 12 (ref 5–15)
BUN: 13 mg/dL (ref 6–20)
CO2: 25 mmol/L (ref 22–32)
Calcium: 9.2 mg/dL (ref 8.9–10.3)
Chloride: 96 mmol/L — ABNORMAL LOW (ref 98–111)
Creatinine, Ser: 0.79 mg/dL (ref 0.61–1.24)
GFR calc Af Amer: >60 mL/min (ref >60)
GFR calc non Af Amer: >60 mL/min (ref >60)
Glucose, Bld: 109 mg/dL — ABNORMAL HIGH (ref 70–99)
Potassium: 4.1 mmol/L (ref 3.5–5.1)
Sodium: 133 mmol/L — ABNORMAL LOW (ref 135–145)

## 2018-11-11 LAB — PROTIME-INR
INR: 1.7 — ABNORMAL HIGH (ref 0.8–1.2)
Prothrombin Time: 19.8 seconds — ABNORMAL HIGH (ref 11.4–15.2)

## 2018-11-11 LAB — CBC
HCT: 32.6 % — ABNORMAL LOW (ref 39.0–52.0)
Hemoglobin: 11 g/dL — ABNORMAL LOW (ref 13.0–17.0)
MCH: 26.3 pg (ref 26.0–34.0)
MCHC: 33.7 g/dL (ref 30.0–36.0)
MCV: 77.8 fL — ABNORMAL LOW (ref 80.0–100.0)
Platelets: 469 10*3/uL — ABNORMAL HIGH (ref 150–400)
RBC: 4.19 MIL/uL — ABNORMAL LOW (ref 4.22–5.81)
RDW: 16.8 % — ABNORMAL HIGH (ref 11.5–15.5)
WBC: 5.3 10*3/uL (ref 4.0–10.5)
nRBC: 0 % (ref 0.0–0.2)

## 2018-11-11 LAB — HEPARIN LEVEL (UNFRACTIONATED): Heparin Unfractionated: 0.52 IU/mL (ref 0.30–0.70)

## 2018-11-11 LAB — MAGNESIUM: Magnesium: 2 mg/dL (ref 1.7–2.4)

## 2018-11-11 MED ORDER — WARFARIN SODIUM 7.5 MG PO TABS
12.5000 mg | ORAL_TABLET | Freq: Once | ORAL | Status: AC
  Administered 2018-11-11: 18:00:00 12.5 mg via ORAL
  Filled 2018-11-11: qty 1

## 2018-11-11 NOTE — Progress Notes (Signed)
Nutrition Follow-up  DOCUMENTATION CODES:   Non-severe (moderate) malnutrition in context of chronic illness, Underweight  INTERVENTION:  Continue Ensure Enlive po TID, each supplement provides 350 kcal and 20 grams of protein.  Encourage adequate PO intake.   NUTRITION DIAGNOSIS:   Moderate Malnutrition related to chronic illness(prostate cancer) as evidenced by moderate fat depletion, moderate muscle depletion; ongoing  GOAL:   Patient will meet greater than or equal to 90% of their needs; met  MONITOR:   PO intake, Supplement acceptance, Skin, Weight trends, Labs, I & O's  REASON FOR ASSESSMENT:   Malnutrition Screening Tool    ASSESSMENT:   57-year-old male with history of coronary artery disease status post cardiac cath with angioplasty, ischemic cardiomyopathy with ejection fraction of 35 to 40%, hypertension, tobacco use, hyperlipidemia who presented with complaints of left arm tingling, weakness, chest pain. Imaging suggested acute ischemic stroke, possible clot in the distal ascending aorta and also possible left ventricle. Imagings also suggested  metastatic prostate cancer.  Meal completion has been 80-90%. Pt currently has Ensure ordered and has been consuming them. RD to continue with current orders to aid in increased caloric and protein needs. Per MD, plan for discharge once INR at 2-3.    Labs and medications reviewed. INR 1.7.  Diet Order:   Diet Order            Diet regular Room service appropriate? Yes; Fluid consistency: Thin  Diet effective now              EDUCATION NEEDS:   Not appropriate for education at this time  Skin:  Skin Assessment: Reviewed RN Assessment  Last BM:  9/1  Height:   Ht Readings from Last 1 Encounters:  11/03/18 6' 1" (1.854 m)    Weight:   Wt Readings from Last 1 Encounters:  11/03/18 59.4 kg    Ideal Body Weight:  83.6 kg  BMI:  Body mass index is 17.28 kg/m.  Estimated Nutritional Needs:   Kcal:   2000-2200  Protein:  100-110 grams  Fluid:  >/= 2 L/day    Stephanie Craig, MS, RD, LDN Pager # [phone] After hours/ weekend pager # [phone]  

## 2018-11-11 NOTE — Progress Notes (Signed)
PROGRESS NOTE    Daniel Weiss  [id number] DOB: 03-19-1960 DOA: 11/03/2018 PCP: Patient, No Pcp Per    Brief Narrative:   Daniel Weiss is a 58 year old male with history of coronary artery disease status post cardiac cath with angioplasty, ischemic cardiomyopathy with ejection fraction of 35 to 40%, hypertension, tobacco use, hyperlipidemia who presented at [facility] with complaints of left arm tingling, weakness, chest pain.  Imaging suggested acute ischemic stroke, possible clot in the distal ascending aorta and also possible left ventricle. CT coronary showed non-obstructing plaque in all coronary distributions,intimal irregularity with filling defect in the ascending aorta but no LV thrombus. Imagings also suggested  metastatic prostate cancer. Patient transferred to [facility].  He was seen by cardiology, neurology, urology here. He  Was started on coumadin with heparin bridge for ascending aorta thrombus. Plan for discharge after INR reaches 2-3.   Assessment & Plan:   Principal Problem:   Acute cerebrovascular accident (CVA) ([place]) Active Problems:   Paresthesia   Essential hypertension   Hyponatremia   Hypokalemia   Tobacco abuse   Coronary artery disease involving native coronary artery of native heart with angina pectoris (HCC)   Hyperlipidemia LDL goal <70   Enlarged prostate   Ischemic cardiomyopathy   Acute cardioembolic stroke (HCC)   Malnutrition of moderate degree   Acute CVA:  Patient presenting with left-sided weakness with associated paresthesias/numbness. MRI showed right-sided frontoparietal infarcts.  Picture suggested embolic nature.  Imaging showed thrombus in the ascending aorta . Neurology was consulted.    Neurological deficits have since resolved. --HbA1c of 5.3. LDL 67. --PT/OT evaluation completed, no follow up recommended. --Continue plavix, statin,coumadin. --Continue monitor on telemetry --Follow up with [facility] neurology as an outpatient  in 4 weeks after discharge.  Ascending aorta thrombus:  CT coronary showed non-obstructing plaque in all coronary distributions,intimal irregularity with filling defect in the ascending aorta but no LV thrombus. --INR 13.3-->1.7 --Continue warfarin with heparin drip until INR 2-3; pharmacy assisting with dosing/monitoring --Case management to assist with outpatient follow-up for management of Coumadin  Coronary artery disease:  Denies any chest pain at present.  Status post LAD stenting 03/06/2018.  Was on aspirin and Brilinta at home.  Evaluated by cardiology, Brilinta and aspirin now has been discontinued. --Continue Plavix and Coumadin  Chronic Systolic CHF:  Echocardiogram showed ejection fraction of 35 to 40%, impaired left ventricular relaxation.    --Continue carvedilol, increased to 6.25 mg p.o. twice daily today --Lisinopril 5 mg p.o. daily --Strict I's and O's and daily weights --Outpatient cardiology follow-up with Dr. Ellyn Hack  Prostate cancer with osseous metastases CT notable for multiple sclerotic bone lesions with a PSA of 500.  Patient was seen by urology in consultation on 11/04/2018; Nicolette Bang.  Bone scan notable for solitary focus of activity left first sacral segment corresponding to a 9 mm mixed lytic and sclerotic metastases. With start of anticoagulation for acute CVA and ascending aortic thrombus, biopsy deferred and urology plans to start therapy for metastatic prostate cancer outpatient.  Hyperlipidemia: Continue high dose statin  Hypertension: Currently blood stable.  Continue current medicines  Tobacco abuse: Counseled cessation.  Nicotine patch ordered.  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   Consultants:   Urology, Dr. Alyson Ingles  Cardiology  Neurology  Cardiothoracic surgery  Procedures:  TTE 11/04/2018: IMPRESSIONS    1. Mild dyskinesis of the left ventricular, entire apical segment.  2. Severe hypokinesis of the left ventricular, mid-apical anteroseptal wall and anterior wall.  3. The left ventricle has moderately reduced systolic function, with an ejection fraction of 35-40%. The cavity size was normal. There is moderate concentric left ventricular hypertrophy. Left ventricular diastolic Doppler parameters are consistent with  impaired relaxation. There is abnormal septal motion consistent with left bundle branch block. No evidence of left ventricular regional wall motion abnormalities.  4. The right ventricle has normal systolic function. The cavity was normal. There is no increase in right ventricular wall thickness. Right ventricular systolic pressure could not be assessed.  5. Sclerosis without any evidence of stenosis of the aortic valve. Aortic valve regurgitation is trivial by color flow Doppler.  6. The aorta is normal unless otherwise noted.  7. The aortic root is normal in size and structure.  8. When compared to the prior study: 08/16/2018, no significant change is seen.  NM Bone Scan 11/05/2018: IMPRESSION: 1. Solitary punctate focus of activity in the LEFT first sacral segment corresponding to a 9 mm mixed lytic and sclerotic metastasis on the recent CT. 2. The remaining mom numerous sclerotic osseous metastases identified on CT do not demonstrate abnormal activity and are therefore healed.  Antimicrobials:  none   Subjective: Patient seen and examined at bedside, resting comfortably.  Eating breakfast.  No specific complaints this morning.  Continues on heparin drip with subtherapeutic INR.  No other  complaints or concerns at this time.  Denies headache, no fever/chills/night sweats, no chest pain, no palpitations, no shortness of breath, no abdominal pain, no weakness, no paresthesias, no cough/congestion, no issues with bowel/bladder function.  No acute events overnight per nursing staff.  Objective: Vitals:   11/10/18 1948 11/10/18 2351 11/11/18 0326 11/11/18 0756  BP: 111/85 (!) 84/59 104/79 (!) 130/92  Pulse: 100 96 95 80  Resp: 18 18 18 16   Temp: 98.6 F (37 C) 98.6 F (37 C) 98.5 F (36.9 C) 98.7 F (37.1 C)  TempSrc: Oral Oral Oral Oral  SpO2: 100% 100% 100% 99%  Weight:      Height:        Intake/Output Summary (Last 24 hours) at 11/11/2018 1130 Last data filed at 11/11/2018 1100 Gross per 24 hour  Intake 1183.98 ml  Output 500 ml  Net 683.98 ml   Filed Weights   11/03/18 1121  Weight: 59.4 kg    Examination:  General exam: Appears calm and comfortable  Respiratory system: Clear to auscultation. Respiratory effort normal. Cardiovascular system: RRR, S1 & S2 heard. No JVD, murmurs, rubs, gallops or clicks. No pedal edema. Gastrointestinal system: Abdomen is nondistended, soft and nontender. No organomegaly or masses felt. Normal bowel sounds heard. Central nervous system: Alert and oriented. No focal neurological deficits. Extremities: Symmetric 5 x 5 power. Skin: No rashes, lesions or ulcers Psychiatry: Judgement and insight appear normal. Mood & affect appropriate.     Data Reviewed: I have personally reviewed following labs and imaging studies  CBC: Recent Labs  Lab 11/07/18 0458 11/08/18 0426 11/09/18 0307 11/10/18 0316 11/11/18 0425  WBC 6.3 6.4 5.8 5.9 5.3  HGB 9.4* 9.2* 10.7* 11.6* 11.0*  HCT 29.1* 27.7* 32.4* 34.8* 32.6*  MCV 81.7 79.1* 79.6* 78.4* 77.8*  PLT 375 372  463* 471* [id number]*   Basic Metabolic Panel: Recent Labs  Lab 11/06/18 1404 11/07/18 0458 11/11/18 0425  NA 137 138 133*  K 3.4* 4.4 4.1  CL 104 108 96*  CO2 25 21* 25   GLUCOSE 127* 86 109*  BUN <5* 5* 13  CREATININE 0.70 0.57* 0.79  CALCIUM 8.5* 8.6* 9.2  MG 1.9  --  2.0   GFR: Estimated Creatinine Clearance: 84.6 mL/min (by C-G formula based on SCr of 0.79 mg/dL). Liver Function Tests: No results for input(s): AST, ALT, ALKPHOS, BILITOT, PROT, ALBUMIN in the last 168 hours. No results for input(s): LIPASE, AMYLASE in the last 168 hours. No results for input(s): AMMONIA in the last 168 hours. Coagulation Profile: Recent Labs  Lab 11/07/18 0458 11/08/18 0426 11/09/18 0307 11/10/18 0316 11/11/18 0425  INR 1.1 1.2 1.2 1.3* 1.7*   Cardiac Enzymes: No results for input(s): CKTOTAL, CKMB, CKMBINDEX, TROPONINI in the last 168 hours. BNP (last 3 results) No results for input(s): PROBNP in the last 8760 hours. HbA1C: No results for input(s): HGBA1C in the last 72 hours. CBG: No results for input(s): GLUCAP in the last 168 hours. Lipid Profile: No results for input(s): CHOL, HDL, LDLCALC, TRIG, CHOLHDL, LDLDIRECT in the last 72 hours. Thyroid Function Tests: No results for input(s): TSH, T4TOTAL, FREET4, T3FREE, THYROIDAB in the last 72 hours. Anemia Panel: No results for input(s): VITAMINB12, FOLATE, FERRITIN, TIBC, IRON, RETICCTPCT in the last 72 hours. Sepsis Labs: No results for input(s): PROCALCITON, LATICACIDVEN in the last 168 hours.  Recent Results (from the past 240 hour(s))  SARS CORONAVIRUS 2 (TAT 6-12 HRS) Nasal Swab Aptima Multi Swab     Status: None   Collection Time: 11/03/18 11:57 AM   Specimen: Aptima Multi Swab; Nasal Swab  Result Value Ref Range Status   SARS Coronavirus 2 NEGATIVE NEGATIVE Final    Comment: (NOTE) SARS-CoV-2 target nucleic acids are NOT DETECTED. The SARS-CoV-2 RNA is generally detectable in upper and lower respiratory specimens during the acute phase of infection. Negative results do not preclude SARS-CoV-2 infection, do not rule out co-infections with other pathogens, and should not be used as the  sole basis for treatment or other patient management decisions. Negative results must be combined with clinical observations, patient history, and epidemiological information. The expected result is Negative. Fact Sheet for Patients: SugarRoll.be Fact Sheet for Healthcare Providers: https://www.woods-mathews.com/ This test is not yet approved or cleared by the [place] FDA and  has been authorized for detection and/or diagnosis of SARS-CoV-2 by FDA under an Emergency Use Authorization (EUA). This EUA will remain  in effect (meaning this test can be used) for the duration of the COVID-19 declaration under Section 56 4(b)(1) of the Act, 21 U.S.C. section 360bbb-3(b)(1), unless the authorization is terminated or revoked sooner. Performed at [facility], [place] [address] [place], [place] [address]   Culture, blood (Routine X 2) w Reflex to ID Panel     Status: None   Collection Time: 11/04/18 11:05 AM   Specimen: BLOOD RIGHT ARM  Result Value Ref Range Status   Specimen Description BLOOD RIGHT ARM  Final   Special Requests   Final    BOTTLES DRAWN AEROBIC AND ANAEROBIC Blood Culture adequate volume   Culture   Final    NO GROWTH 5 DAYS Performed at [facility], [place] [address], [place], [place] [address]    Report Status 11/09/2018 FINAL  Final  Culture, blood (Routine X 2) w Reflex  to ID Panel     Status: None   Collection Time: 11/04/18 11:09 AM   Specimen: BLOOD RIGHT HAND  Result Value Ref Range Status   Specimen Description BLOOD RIGHT HAND  Final   Special Requests   Final    BOTTLES DRAWN AEROBIC AND ANAEROBIC Blood Culture adequate volume   Culture   Final    NO GROWTH 5 DAYS Performed at [facility], [address] [place], [place] [address]    Report Status 11/09/2018 FINAL  Final         Radiology Studies: No results found.      Scheduled Meds: . atorvastatin  80 mg Oral Once  .  carvedilol  6.25 mg Oral BID WC  . clopidogrel  75 mg Oral Daily  . feeding supplement (ENSURE ENLIVE)  237 mL Oral TID BM  . lisinopril  5 mg Oral Daily  . nicotine  21 mg Transdermal Daily  . tamsulosin  0.4 mg Oral QPC supper  . warfarin  12.5 mg Oral ONCE-1800  . Warfarin - Pharmacist Dosing Inpatient   Does not apply q1800   Continuous Infusions: . heparin 1,400 Units/hr (11/11/18 0000)     LOS: 8 days    Time spent: 29 minutes spent on chart review, discussion with nursing staff, consultants, personally reviewing all imaging studies and labs, updating family and interview/physical exam; more than 50% of that time was spent in counseling and/or coordination of care.    Denzel Etienne J [place] DO [facility] Pager [phone]  If 7PM-7AM, please contact night-coverage [email or web address] Password [email or web address] 11/11/2018, 11:30 AM

## 2018-11-11 NOTE — Plan of Care (Signed)
Progressing towards goals

## 2018-11-11 NOTE — TOC Progression Note (Signed)
[facility] - Progression Note    Patient Details  Name: Daniel Weiss MRN: [id number] Date of Birth: 01-18-1961  Transition [facility]) CM/SW Contact  Pollie Friar, RN Phone Number: 11/11/2018, 2:35 PM  Clinical Narrative:    CM was able to get him a Coumadin appt on Tues Sept 8th, information on the AVS. PCP at [facility] and information on the AVS. TOC following for further d/c needs.    Expected Discharge Plan: OP Rehab Barriers to Discharge: Continued Medical Work up  Expected Discharge Plan and Services Expected Discharge Plan: OP Rehab   Discharge Planning Services: CM Consult                                           Social Determinants of Health (SDOH) Interventions    Readmission Risk Interventions No flowsheet data found.

## 2018-11-11 NOTE — Progress Notes (Signed)
ANTICOAGULATION CONSULT NOTE  Pharmacy Consult:  Heparin / Coumadin Indication: thrombus and CVA  No Known Allergies  Patient Measurements: Height: 6\' 1"  (185.4 cm) Weight: 131 lb (59.4 kg) IBW/kg (Calculated) : 79.9 Heparin Dosing Weight: 59.4 kg  Vital Signs: Temp: 98.7 F (37.1 C) (09/03 0756) Temp Source: Oral (09/03 0756) BP: 130/92 (09/03 0756) Pulse Rate: 80 (09/03 0756)  Labs: Recent Labs    11/09/18 0307 11/09/18 1309 11/09/18 1702 11/09/18 1820 11/10/18 0316 11/11/18 0425  HGB 10.7*  --   --   --  11.6* 11.0*  HCT 32.4*  --   --   --  34.8* 32.6*  PLT 463*  --   --   --  471* 469*  LABPROT 14.8  --   --   --  16.1* 19.8*  INR 1.2  --   --   --  1.3* 1.7*  HEPARINUNFRC 0.55 0.49  --   --  0.42 0.52  CREATININE  --   --   --   --   --  0.79  TROPONINIHS  --   --  21* 23*  --   --     Estimated Creatinine Clearance: 84.6 mL/min (by C-G formula based on SCr of 0.79 mg/dL).  Assessment: 33 yoM transferred from [facility] with CVA for CTA work up. 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 No tPa was given. No AC PTA. Kary Kos is currently held.   Patient is on IV heparin and Coumadin bridge.  Heparin level is slightly supra-therapeutic and INR is approaching goal.  No bleeding reported.  Goal of Therapy:  INR 2-3  Heparin level 0.3-0.5 units/mL Monitor platelets by anticoagulation protocol: Yes   Plan:  Reduce heparin gtt to 1350 units/hr Coumadin 12.5mg  PO today Daily heparin level, PT / INR and CBC MD to advise whether to transition from IV heparin to SQ Lovenox, though anticipate INR will be therapeutic soon  Tate Jerkins D. Mina Marble, PharmD, BCPS, [place] 11/11/2018, 10:12 AM

## 2018-11-12 LAB — PROTIME-INR
INR: 1.6 — ABNORMAL HIGH (ref 0.8–1.2)
Prothrombin Time: 18.9 seconds — ABNORMAL HIGH (ref 11.4–15.2)

## 2018-11-12 LAB — CBC
HCT: 32.1 % — ABNORMAL LOW (ref 39.0–52.0)
Hemoglobin: 10.7 g/dL — ABNORMAL LOW (ref 13.0–17.0)
MCH: 25.8 pg — ABNORMAL LOW (ref 26.0–34.0)
MCHC: 33.3 g/dL (ref 30.0–36.0)
MCV: 77.3 fL — ABNORMAL LOW (ref 80.0–100.0)
Platelets: 456 10*3/uL — ABNORMAL HIGH (ref 150–400)
RBC: 4.15 MIL/uL — ABNORMAL LOW (ref 4.22–5.81)
RDW: 16.6 % — ABNORMAL HIGH (ref 11.5–15.5)
WBC: 5.3 10*3/uL (ref 4.0–10.5)
nRBC: 0 % (ref 0.0–0.2)

## 2018-11-12 LAB — HEPARIN LEVEL (UNFRACTIONATED): Heparin Unfractionated: 0.48 IU/mL (ref 0.30–0.70)

## 2018-11-12 MED ORDER — ENOXAPARIN SODIUM 60 MG/0.6ML ~~LOC~~ SOLN: 60.0000 mg | Freq: Two times a day (BID) | SUBCUTANEOUS | 0 refills | Status: DC

## 2018-11-12 MED ORDER — LISINOPRIL 5 MG PO TABS: 5.0000 mg | ORAL_TABLET | Freq: Every day | ORAL | 0 refills | Status: DC

## 2018-11-12 MED ORDER — WARFARIN SODIUM 7.5 MG PO TABS: 15.0000 mg | ORAL_TABLET | Freq: Every day | ORAL | 0 refills | Status: DC

## 2018-11-12 MED ORDER — TAMSULOSIN HCL 0.4 MG PO CAPS: 0.4000 mg | ORAL_CAPSULE | Freq: Every day | ORAL | 0 refills | Status: DC

## 2018-11-12 MED ORDER — ENOXAPARIN SODIUM 60 MG/0.6ML ~~LOC~~ SOLN
60.0000 mg | Freq: Two times a day (BID) | SUBCUTANEOUS | Status: DC
  Administered 2018-11-12: 60 mg via SUBCUTANEOUS
  Filled 2018-11-12 (×2): qty 0.6

## 2018-11-12 MED ORDER — WARFARIN SODIUM 7.5 MG PO TABS: 15.0000 mg | ORAL_TABLET | Freq: Once | ORAL | Status: DC

## 2018-11-12 MED ORDER — CARVEDILOL 6.25 MG PO TABS: 6.2500 mg | ORAL_TABLET | Freq: Two times a day (BID) | ORAL | 0 refills | Status: DC

## 2018-11-12 MED FILL — ENOXAPARIN SODIUM 60 MG/0.6: 60 | 7 days supply | Qty: 8 | Fill #0

## 2018-11-12 MED FILL — CARVEDILOL 6.25 MG TABLET: 6.25 | 30 days supply | Qty: 60 | Fill #0

## 2018-11-12 MED FILL — LISINOPRIL 5 MG TABLET: 5 | 30 days supply | Qty: 30 | Fill #0

## 2018-11-12 MED FILL — TAMSULOSIN HCL 0.4 MG CAP: 0.4 | 30 days supply | Qty: 30 | Fill #0

## 2018-11-12 MED FILL — WARFARIN SODIUM 7.5 MG TAB: 7.5 | 7 days supply | Qty: 14 | Fill #0

## 2018-11-12 NOTE — Progress Notes (Signed)
All questions answered. Patient had all medications provided by pharmacy. Discharged via wheelchair.

## 2018-11-12 NOTE — Progress Notes (Signed)
Received report form tele patient had ST elevation on monitor. Patient is asymptomatic. Dr. [place] notified and aware of patient's status. Dr. [place] stated patient can still be discharged home today.

## 2018-11-12 NOTE — TOC Transition Note (Signed)
Transition of Care [facility]) - CM/SW Discharge Note   Patient Details  Name: Daniel Weiss MRN: [id number] Date of Birth: Dec 07, 1960  Transition of Care [facility]) CM/SW Contact:  Pollie Friar, RN Phone Number: 11/12/2018, 11:29 AM   Clinical Narrative:    Pt discharging home and will participate in outpatient therapy. Orders in Epic and information on the AVS for outpatient therapy.  PCP and coumadin appts on his AVS.  Pt to stay with his sister at d/c who can provide supervision.  Pt to d/c with lovenox. Co pay is affordable for the patient. Bedside RN to instruct pt in administration of the lovenox.  Pt has transport home.    Final next level of care: OP Rehab Barriers to Discharge: No Barriers Identified   Patient Goals and CMS Choice     Choice offered to / list presented to : Patient  Discharge Placement                       Discharge Plan and Services   Discharge Planning Services: CM Consult                                 Social Determinants of Health (SDOH) Interventions     Readmission Risk Interventions No flowsheet data found.

## 2018-11-12 NOTE — Plan of Care (Signed)
Patient was able to successfully demonstrate a lovenox injection with teachback.

## 2018-11-12 NOTE — Progress Notes (Addendum)
ANTICOAGULATION CONSULT NOTE  Pharmacy Consult:  Heparin >> Lovenox + Coumadin Indication: thrombus and CVA  No Known Allergies  Patient Measurements: Height: 6\' 1"  (185.4 cm) Weight: 131 lb (59.4 kg) IBW/kg (Calculated) : 79.9 Heparin Dosing Weight: 59.4 kg  Vital Signs: Temp: 98.6 F (37 C) (09/04 0333) Temp Source: Oral (09/04 0333) BP: 113/82 (09/04 0333) Pulse Rate: 86 (09/04 0333)  Labs: Recent Labs    11/09/18 1702 11/09/18 1820  11/10/18 0316 11/11/18 0425 11/12/18 0325  HGB  --   --    < > 11.6* 11.0* 10.7*  HCT  --   --   --  34.8* 32.6* 32.1*  PLT  --   --   --  471* 469* 456*  LABPROT  --   --   --  16.1* 19.8* 18.9*  INR  --   --   --  1.3* 1.7* 1.6*  HEPARINUNFRC  --   --   --  0.42 0.52 0.48  CREATININE  --   --   --   --  0.79  --   TROPONINIHS 21* 23*  --   --   --   --    < > = values in this interval not displayed.    Estimated Creatinine Clearance: 84.6 mL/min (by C-G formula based on SCr of 0.79 mg/dL).  Assessment: 17 yoM transferred from [facility] with CVA for CTA work up. 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 No tPa was given. No AC PTA. Kary Kos is currently held.  Pharmacy consulted to transition from IV heparin to SQ Lovenox and continue Coumadin.  Heparin level therapeutic and INR remains sub-therapeutic.  No bleeding reported.  Goal of Therapy:  INR 2-3  Heparin level 0.3-0.5 units/mL Monitor platelets by anticoagulation protocol: Yes   Plan:  Stop IV heparin at 0900 At 1000, start Lovenox 60mg  SQ BID Coumadin 15mg  PO today Daily PT / INR; CBC Q72H while on Lovenox  Recommend discharging on Coumadin 15mg  PO daily until INR check on Tuesday 11/16/18.  Continue Lovenox 60mg  SQ BID until INR therapeutic x 2 (least 8 syringes and Coumadin clinic can prescribe more if needed).  Amairany Schumpert D. Mina Marble, PharmD, BCPS, [place] 11/12/2018, 8:22 AM

## 2018-11-12 NOTE — Progress Notes (Signed)
Patient educated on the proper steps for SQ lovenox injection. Patient verbalized understanding and was able to demonstrate self administration. All questions answered at bedside.

## 2018-11-12 NOTE — Discharge Summary (Signed)
Physician Discharge Summary  Daniel Weiss [id number] DOB: [birth year] DOA: 11/03/2018  PCP: Patient, No Pcp Per  Admit date: 11/03/2018 Discharge date: 11/12/2018  Admitted From: Home Disposition: Home  Recommendations for Outpatient Follow-up:  1. Follow up with PCP in 1-2 weeks 2. Patient has follow-up with Coumadin clinic on [address] On 11/16/2018 at 1030 3. Patient has follow-up with Dr. Ellyn Hack on 11/29/2018 at 1340 4. Discharge on Coumadin 50 mg p.o. daily with Lovenox 60 mg [place] bridge 5. Discontinued Brilinta in favor of Plavix for acute CVA 6. Decrease Coreg to 6.25 mg p.o. twice daily 7. Decrease lisinopril to 5 mg p.o. daily  Home Health: No Equipment/Devices: None  Discharge Condition: Stable CODE STATUS: Full code Diet recommendation: Heart Healthy  History of present illness:  Daniel Weiss is a 58 year old male with history of coronary artery disease status post cardiac cath with angioplasty, ischemic cardiomyopathy with ejection fraction of 35 to 40%, hypertension, tobacco use, hyperlipidemia who presented at [facility] with complaints of left arm tingling, weakness, chest pain. Imaging suggested acute ischemic stroke, possible clot in the distal ascending aorta and also possible left ventricle. CT coronary showednon-obstructing plaque in all coronary distributions,intimal irregularity with filling defect in the ascending aortabut no LV thrombus. Imagings also suggestedmetastatic prostate cancer. Patient transferred to [facility]. He wasseen by cardiology, neurology, urology here. He Was started on coumadin with heparin bridge for ascending aorta thrombus.   Hospital course:  Acute CVA:  Patient presenting with left-sided weakness with associated paresthesias/numbness. MRI showed right-sided frontoparietal infarcts. Picture suggested embolic nature. Imaging showed thrombus in the ascending aorta . Neurology was consulted. Patient declined TPA.   Neurological deficits have since resolved. HbA1c of 5.3. LDL 67. PT/OT evaluation with no follow up recommended.  Patient's antiplatelet was changed from Brilinta to Plavix.  He was continued on statin.  And started on Coumadin.  Patient will followup with [facility] neurology as an outpatient in 4 weeks after discharge.  Ascending aorta thrombus:  CT coronary showednon-obstructing plaque in all coronary distributions,intimal irregularity with filling defect in the ascending aortabut no LV thrombus.  Cardiothoracic surgery was consulted with no need for surgical intervention.  Patient was started on Coumadin with heparin and now Lovenox bridge until INR therapeutic between 2-3.  Patient to follow-up with cardiothoracic surgery outpatient with planned follow-up CTA chest in 3 months to ensure that thrombus in distal ascending order is resolving.  INR 1.6 at time of discharge.  Patient continue Lovenox bridge following discharge.  Patient has follow-up with Coumadin clinic on [address] on 11/16/2018 at 10:30 AM.  Coronary artery disease: Denies any chest pain at present. Status post LAD stenting 03/06/2018. Was on aspirin and Brilinta at home.  Evaluated by cardiology, Brilinta and aspirin now has been discontinued. Continue Plavix and Coumadin.  Chronic Systolic CHF: Echocardiogram showed ejection fraction of 35 to 40%, impaired left ventricular relaxation.  Continue carvedilol at 6.25 mg p.o. twice daily, lisinopril 5 mg p.o. daily.  Patient instructed to maintain daily weights.  Has outpatient cardiology follow-up with Dr. Ellyn Hack scheduled on 9/21 at 1:40 PM  Prostate cancer with osseous metastases CT notable for multiple sclerotic bone lesions with a PSA of 500.  Patient was seen by urology in consultation on 11/04/2018; Nicolette Bang.  Bone scan notable for solitary focus of activity left first sacral segment corresponding to a 9 mm mixed lytic and sclerotic metastases. With start of  anticoagulation for acute CVA and ascending aortic thrombus, biopsy  deferred and urology plans to start therapy for metastatic prostate cancer outpatient.  Hyperlipidemia:Continue high dose statin  Hypertension: Currently blood stable. Continue current medicines  Tobacco abuse: Counseled cessation. Nicotine patch ordered.  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 which has been stable since last several years  Discharge Diagnoses:  Principal Problem:   Acute cerebrovascular accident (CVA) ([place]) Active Problems:   Paresthesia   Essential hypertension   Hyponatremia   Hypokalemia   Tobacco abuse   Coronary artery disease involving native coronary artery of native heart with angina pectoris (HCC)   Hyperlipidemia LDL goal <70   Enlarged prostate   Ischemic cardiomyopathy   Acute cardioembolic stroke (HCC)   Malnutrition of moderate degree    Discharge Instructions  Discharge Instructions    Ambulatory referral to Physical Therapy   Complete by: As directed    Call MD for:  difficulty breathing, headache or visual disturbances   Complete by: As directed    Call MD for:  extreme fatigue   Complete by: As directed    Call MD for:  persistant dizziness or light-headedness   Complete by: As directed    Call MD for:  persistant nausea and vomiting   Complete by: As directed    Call MD for:  severe uncontrolled pain   Complete by: As directed    Call MD for:  temperature >100.4   Complete by: As directed    Diet - low sodium heart healthy   Complete by: As directed    Increase activity slowly   Complete by: As directed      Allergies as of 11/12/2018   No Known Allergies     Medication List    STOP taking these medications   ticagrelor 90 MG Tabs tablet Commonly known as: BRILINTA     TAKE these medications   acetaminophen 325 MG tablet Commonly known as: TYLENOL Take 2 tablets (650 mg total) by mouth every 4 (four)  hours as needed for mild pain (temperature >/= 99.5 F).   aspirin 81 MG EC tablet Take 1 tablet (81 mg total) by mouth daily.   atorvastatin 80 MG tablet Commonly known as: Lipitor Take 1 tablet (80 mg total) by mouth daily.   carvedilol 6.25 MG tablet Commonly known as: COREG Take 1 tablet (6.25 mg total) by mouth 2 (two) times daily with a meal. What changed:   medication strength  how much to take   enoxaparin 60 MG/0.6ML injection Commonly known as: LOVENOX Inject 0.6 mLs (60 mg total) into the skin every 12 (twelve) hours for 7 days.   lisinopril 5 MG tablet Commonly known as: ZESTRIL Take 1 tablet (5 mg total) by mouth daily. What changed:   medication strength  how much to take   nitroGLYCERIN 0.4 MG SL tablet Commonly known as: Nitrostat Place 1 tablet (0.4 mg total) under the tongue every 5 (five) minutes as needed for chest pain.   tamsulosin 0.4 MG Caps capsule Commonly known as: FLOMAX Take 1 capsule (0.4 mg total) by mouth daily after supper.   warfarin 7.5 MG tablet Commonly known as: COUMADIN Take 2 tablets (15 mg total) by mouth daily at 6 PM.      Follow-up Information    [facility]. Schedule an appointment as soon as possible for a visit in 4 week(s).   Specialty: Neurology Contact information: [address] [place] [phone]  [name], MD. Schedule an appointment as soon as possible for a visit in 4 week(s).   Specialty: Cardiology Why: Appointment scheduled on 11/29/2018 at 1:40 PM Contact information: [address] [place] [address] [phone]        Cleon Gustin, MD. Schedule an appointment as soon as possible for a visit in 1 week(s).   Specialty: Urology Contact information: [place] [address] [phone]        [facility] Follow up.   Specialty: Rehabilitation Why:  The outpatient rehab will contact you for the first appointment Contact information: [place] [facility] [place] Follow up on 11/16/2018.   Specialty: Cardiology Why: Anticoagulation clinic appointment scheduled on 11/16/2018 at 10:30 AM Contact information: [address] [place] [phone]         No Known Allergies  Consultations:  Neurology  Cardiothoracic surgery  Cardiology  Urology   Procedures/Studies: Ct Angio Head W Or Wo Contrast  Result Date: 11/03/2018 CLINICAL DATA:  Acute onset of chest pain today. Numbness of the left arm and hand. EXAM: CT ANGIOGRAPHY HEAD AND NECK TECHNIQUE: Multidetector CT imaging of the head and neck was performed using the standard protocol during bolus administration of intravenous contrast. Multiplanar CT image reconstructions and MIPs were obtained to evaluate the vascular anatomy. Carotid stenosis measurements (when applicable) are obtained utilizing NASCET criteria, using the distal internal carotid diameter as the denominator. CONTRAST:  179mL OMNIPAQUE IOHEXOL 350 MG/ML SOLN COMPARISON:  Head CT earlier same day FINDINGS: CTA NECK FINDINGS Aortic arch: Arch is negative. Branching pattern is normal without stenosis. Right carotid system: Common carotid artery widely patent to the bifurcation. No carotid bifurcation stenosis or irregularity. Minimal plaque in the ICA bulb. Left carotid system: Common carotid artery widely patent to the bifurcation. Mild atherosclerotic plaque at the carotid bifurcation and proximal external carotid. No internal carotid stenosis or irregularity. Vertebral arteries: Both vertebral artery origins are patent. Mild atherosclerotic plaque on the right but no flow limiting stenosis. On the left, there is 50% stenosis due to focal calcified plaque. Both vertebral arteries are  patent through the cervical region to the foramen magnum. Skeleton: Degenerative cervical spondylosis. Prominent osteophytes/disc herniations at C4-5 and C5-6 that could cause symptomatic stenosis of the canal and or foramina. Other neck: Large right parotid mass involving both the deep lobe and superficial lobe extending inferior from the superficial lobe. This has been seen on multiple previous examinations and is not changing, consistent with benign nature. Axial measurements are unchanged since 2017 at 32 x 43 mm. Upper chest: Negative Review of the MIP images confirms the above findings CTA HEAD FINDINGS Anterior circulation: Both internal carotid arteries are patent through the skull base and siphon regions. The anterior and middle cerebral vessels are patent without proximal stenosis, aneurysm or vascular malformation. No embolic occlusions are identified. Posterior circulation: Both vertebral arteries are widely patent through the foramen magnum to the basilar. The right is dominant. No basilar stenosis. Posterior circulation branch vessels are patent. Patent posterior communicating arteries on each side. Venous sinuses: Patent and normal. Anatomic variants: None significant. Review of the MIP images confirms the above findings IMPRESSION: No acute vascular finding to explain the clinical presentation. No sign of dissection or occlusion of the brachiocephalic or intracranial vessels. Mild atherosclerotic plaque as above but  without flow limiting stenosis in the anterior circulation vessels. Posterior circulation vessels are patent. Atherosclerotic disease at both vertebral artery origins without measurable stenosis on the right and with focal 50% stenosis on the left. 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 Advanced cervical degenerative disease with spinal stenosis and foraminal stenosis at C4-5 and C5-6 that could be  significant. Electronically Signed   By: Nelson Chimes M.D.   On: 11/03/2018 13:40   Ct Angio Neck W And/or Wo Contrast  Result Date: 11/03/2018 CLINICAL DATA:  Acute onset of chest pain today. Numbness of the left arm and hand. EXAM: CT ANGIOGRAPHY HEAD AND NECK TECHNIQUE: Multidetector CT imaging of the head and neck was performed using the standard protocol during bolus administration of intravenous contrast. Multiplanar CT image reconstructions and MIPs were obtained to evaluate the vascular anatomy. Carotid stenosis measurements (when applicable) are obtained utilizing NASCET criteria, using the distal internal carotid diameter as the denominator. CONTRAST:  139mL OMNIPAQUE IOHEXOL 350 MG/ML SOLN COMPARISON:  Head CT earlier same day FINDINGS: CTA NECK FINDINGS Aortic arch: Arch is negative. Branching pattern is normal without stenosis. Right carotid system: Common carotid artery widely patent to the bifurcation. No carotid bifurcation stenosis or irregularity. Minimal plaque in the ICA bulb. Left carotid system: Common carotid artery widely patent to the bifurcation. Mild atherosclerotic plaque at the carotid bifurcation and proximal external carotid. No internal carotid stenosis or irregularity. Vertebral arteries: Both vertebral artery origins are patent. Mild atherosclerotic plaque on the right but no flow limiting stenosis. On the left, there is 50% stenosis due to focal calcified plaque. Both vertebral arteries are patent through the cervical region to the foramen magnum. Skeleton: Degenerative cervical spondylosis. Prominent osteophytes/disc herniations at C4-5 and C5-6 that could cause symptomatic stenosis of the canal and or foramina. Other neck: Large right parotid mass involving both the deep lobe and superficial lobe extending inferior from the superficial lobe. This has been seen on multiple previous examinations and is not changing, consistent with benign nature. Axial measurements are  unchanged since 2017 at 32 x 43 mm. Upper chest: Negative Review of the MIP images confirms the above findings CTA HEAD FINDINGS Anterior circulation: Both internal carotid arteries are patent through the skull base and siphon regions. The anterior and middle cerebral vessels are patent without proximal stenosis, aneurysm or vascular malformation. No embolic occlusions are identified. Posterior circulation: Both vertebral arteries are widely patent through the foramen magnum to the basilar. The right is dominant. No basilar stenosis. Posterior circulation branch vessels are patent. Patent posterior communicating arteries on each side. Venous sinuses: Patent and normal. Anatomic variants: None significant. Review of the MIP images confirms the above findings IMPRESSION: No acute vascular finding to explain the clinical presentation. No sign of dissection or occlusion of the brachiocephalic or intracranial vessels. Mild atherosclerotic plaque as above but without flow limiting stenosis in the anterior circulation vessels. Posterior circulation vessels are patent. Atherosclerotic disease at both vertebral artery origins without measurable stenosis on the right and with focal 50% stenosis on the left. 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 Advanced cervical degenerative disease with spinal stenosis and foraminal stenosis at C4-5 and C5-6 that could be significant. Electronically Signed   By: Nelson Chimes M.D.   On: 11/03/2018 13:40   Mr Brain Wo Contrast  Result Date: 11/03/2018 CLINICAL DATA:  Left upper  extremity weakness/numbness and chest pain. EXAM: MRI HEAD WITHOUT CONTRAST TECHNIQUE: Multiplanar, multiecho pulse sequences of the brain and surrounding structures were obtained without intravenous contrast. COMPARISON:  Head CT 11/03/2018 and MRI 06/30/2015 FINDINGS: Brain: A few subcentimeter acute infarcts are present in the  posterior right frontal and right parietal lobes involving cortex and subcortical white matter. Small foci of T2 hyperintensity in the cerebral white matter bilaterally have mildly progressed from 2017 and are nonspecific but compatible with mildly age advanced chronic small vessel ischemic disease. The ventricles and sulci are within normal limits for age. Vascular: Major intracranial vascular flow voids are preserved. Skull and upper cervical spine: Unremarkable bone marrow signal. Sinuses/Orbits: Unremarkable orbits. Minimal scattered mucosal thickening in the paranasal sinuses. Clear mastoid air cells. Other: Partially visualized large right parotid region mass as seen on multiple prior examinations. IMPRESSION: 1. Small acute right frontoparietal infarcts. 2. Mild chronic small vessel ischemic disease, mildly progressed from 2017. Electronically Signed   By: Logan Bores M.D.   On: 11/03/2018 18:37   Nm Bone Scan Whole Body  Result Date: 11/05/2018 CLINICAL DATA:  58 year old with markedly elevated PSA of 502. Recent CTA chest abdomen and pelvis demonstrated numerous sclerotic osseous lesions worrisome for metastatic disease. EXAM: NUCLEAR MEDICINE WHOLE BODY BONE SCAN TECHNIQUE: Whole body anterior and posterior images were obtained approximately 3 hours after intravenous injection of radiopharmaceutical. RADIOPHARMACEUTICALS:  21.7 mCi Technetium-59m MDP IV COMPARISON:  No prior nuclear imaging. Bone window images from CTA chest abdomen and pelvis 11/03/2018 is correlated. That examination demonstrated numerous very small round sclerotic osseous metastases throughout the spine,, sacrum ribs, pelvis and proximal femora. FINDINGS: Solitary punctate focus of increased activity involving the LEFT first sacral segment, corresponding to a mixed lytic and sclerotic 9 mm metastasis on the recent CT. No abnormal skeletal elsewhere activity to suggest active bone formation, indicating that the vast majority of the  multiple sclerotic osseous metastases are healed. Mild degenerative activity is present in the acromioclavicular joints bilaterally. IMPRESSION: 1. Solitary punctate focus of activity in the LEFT first sacral segment corresponding to a 9 mm mixed lytic and sclerotic metastasis on the recent CT. 2. The remaining mom numerous sclerotic osseous metastases identified on CT do not demonstrate abnormal activity and are therefore healed. Electronically Signed   By: Evangeline Dakin M.D.   On: 11/05/2018 14:48   Ct Coronary Morph W/cta Cor W/score W/ca W/cm &/or Wo/cm  Addendum Date: 11/04/2018   ADDENDUM REPORT: 11/04/2018 12:42 CLINICAL DATA:  47M with CAD s/p LAD PCI, chronic systolic and diastolic heart failure, LBBB, hypertension, tobacco abuse and hyperlipidemia admitted with acute embolic stroke and peripheral emboli. EXAM: Cardiac/Coronary  CT TECHNIQUE: The patient was scanned on a [employer]. FINDINGS: A 120 kV prospective scan was triggered in the descending thoracic aorta at 111 HU's. Axial non-contrast 3 mm slices were carried out through the heart. The data set was analyzed on a dedicated work station and scored using the Jakin. Gantry rotation speed was 250 msecs and collimation was .6 mm. No beta blockade and 0.8 mg of sl NTG was given. The 3D data set was reconstructed in 5% intervals of the 67-82 % of the R-R cycle. Diastolic phases were analyzed on a dedicated work station using MPR, MIP and VRT modes. The patient received 80 cc of contrast. Aorta: Normal size. Ascending aorta 3.5 cm. No calcifications. There is an intimal irregularity with a filling defect in the ascending aorta as previously described. Normal variant bovine  aortic arch noted. Aortic Valve:  Trileaflet.  No calcifications. Coronary Arteries:  Normal coronary origin.  Right dominance. RCA is a large dominant artery that gives rise to PDA and PLVB. There is diffuse minimal (<25%) to mild (25-49%) calcified plaque.  Left main is a large artery that gives rise to LAD and LCX arteries. There is minimal (<25%) calcified plaque. LAD is a large vessel. Proximal LAD stent is patent. The proximal LAD is heavily calcified but there is minimal obstruction. There is minimal mixed plaque in the mid to apical LAD with associated positive remodeling. There is mild (25-49%) calcified plaque in proximal D1. LCX is a non-dominant artery. There is moderate calcified plaque in the proximal to mid LCX. RI has mild (25-49%) calcified plaque in the ostium and proximal regions. Other findings: Normal pulmonary vein drainage into the left atrium. Normal let atrial appendage without a thrombus. Normal size of the pulmonary artery. IMPRESSION: 1. Normal coronary origin with right dominance. 2. Patent proximal LAD stent. 3.  Non-obstructing plaque in all coronary distributions. 4. Intimal irregularity with filling defect in the ascending aorta as noted by Radiology. 5.  No left ventricular thrombus noted. Skeet Latch, MD Electronically Signed   By: Skeet Latch   On: 11/04/2018 12:42   Result Date: 11/04/2018 EXAM: OVER-READ INTERPRETATION  CT CHEST The following report is an over-read performed by radiologist Dr. Rolm Baptise of [facility], [address] on 11/03/2018. This over-read does not include interpretation of cardiac or coronary anatomy or pathology. The coronary CTA interpretation by the cardiologist is attached. COMPARISON:  CTA chest earlier today FINDINGS: Vascular: There is intimal irregularity with filling defect extending into the lumen of the distal ascending thoracic aorta near the proximal aortic arch. This is stable since study earlier today. No evidence of thoracic aortic aneurysm. Mediastinum/Nodes: No mediastinal, hilar, or axillary adenopathy. Lungs/Pleura: Lungs clear.  No effusions. Upper Abdomen: Imaging into the upper abdomen shows no acute findings. Musculoskeletal: Scattered sclerotic densities seen in the  thoracic spine as described on prior CT. IMPRESSION: Intimal irregularity with luminal filling defect in the proximal arch/distal ascending thoracic aorta, stable since prior study. Electronically Signed: By: Rolm Baptise M.D. On: 11/03/2018 17:07   Dg Chest Portable 1 View  Result Date: 11/03/2018 CLINICAL DATA:  Pt states he woke up with chest pain this morning. He took 1 nitroglycerin which relieved the pain. One hour ( between 1030-1045) ago he reports numbness to L lower arm and hand., hx of TIA, HTN, no other complaints EXAM: PORTABLE CHEST 1 VIEW COMPARISON:  Chest radiographs dated 12/28 2019, 06/29/2015 FINDINGS: The heart size and mediastinal contours are within normal limits. The lungs are clear. No pneumothorax or pleural effusion. No acute abnormality in the visualized skeleton. IMPRESSION: Normal chest radiograph. Electronically Signed   By: Audie Pinto M.D.   On: 11/03/2018 11:44   Ct Angio Chest/abd/pel For Dissection W And/or Wo Contrast  Result Date: 11/03/2018 CLINICAL DATA:  58 year old male with acute chest and back pain as well as stroke-like symptoms concerning for dissection. EXAM: CT ANGIOGRAPHY CHEST, ABDOMEN AND PELVIS TECHNIQUE: Multidetector CT imaging through the chest, abdomen and pelvis was performed using the standard protocol during bolus administration of intravenous contrast. Multiplanar reconstructed images and MIPs were obtained and reviewed to evaluate the vascular anatomy. CONTRAST:  156mL OMNIPAQUE IOHEXOL 350 MG/ML SOLN COMPARISON:  Prior CTA of the chest abdomen and pelvis 03/06/2018 FINDINGS: CTA CHEST FINDINGS Cardiovascular: 2 vessel aortic arch. The right brachiocephalic and left  common carotid artery share a common origin. On the unenhanced images, there is no evidence of intramural high attenuation to suggest intramural hematoma. Following administration of intravenous contrast, there is very subtle irregularity of the intima in the ascending thoracic  aorta just proximal to the aortic arch. There is a linear irregular filling defect extending into the aortic lumen measuring approximately 1.4 cm in length by 0.2 mm in width. This almost certainly represents an area of linear thrombus adherent to the aortic wall. No evidence of aortic wall thickening, stranding in the mediastinal fat or hemorrhage. The remainder of the aortic arch and descending thoracic aorta are normal. The aortic root is normal. The heart is normal in size. Calcifications are present along the left main, left anterior descending, circumflex and right coronary artery. No pericardial effusion. Mediastinum/Nodes: Unremarkable CT appearance of the thyroid gland. No suspicious mediastinal or hilar adenopathy. No soft tissue mediastinal mass. The thoracic esophagus is unremarkable. Lungs/Pleura: Lungs are clear. No pleural effusion or pneumothorax. Musculoskeletal: No acute fracture or aggressive appearing lytic or blastic osseous lesion. Small low-attenuation lesion just deep to the skin surface on the right anterior chest wall just above the nipple line likely represents a sebaceous or other benign cutaneous cyst. Review of the MIP images confirms the above findings. CTA ABDOMEN AND PELVIS FINDINGS VASCULAR Aorta: The aorta is normal in caliber without evidence of aneurysm. Compared to the prior study from December of 2019, there has been significant interval progression of fibrofatty atherosclerotic plaque which is become irregular and ulcerated beginning at the aortic hiatus and extending through the visceral, renal and infrarenal segments of the aorta. Just below the renal arteries, the lumen is narrowed by approximately 70%. Celiac: Patent without evidence of aneurysm, dissection, vasculitis or significant stenosis. SMA: Patent without evidence of aneurysm, dissection, vasculitis or significant stenosis. Renals: Both renal arteries are patent without evidence of aneurysm, dissection, vasculitis,  fibromuscular dysplasia or significant stenosis. IMA: Patent without evidence of aneurysm, dissection, vasculitis or significant stenosis. Inflow: Heterogeneous calcified and noncalcified atherosclerotic plaque without significant stenosis in either common iliac artery. No evidence of dissection. The left internal iliac artery is occluded at the origin but ultimately reconstitutes. The external iliac arteries are patent. 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 Veins: No focal venous abnormality. Review of the MIP images confirms the above findings. NON-VASCULAR Hepatobiliary: Normal morphology. Stable circumscribed low-attenuation lesions which almost certainly represent benign cysts. Gallbladder is unremarkable. No intra or extrahepatic biliary ductal dilatation. Pancreas: Unremarkable. No pancreatic ductal dilatation or surrounding inflammatory changes. Spleen: Normal in size without focal abnormality. Adrenals/Urinary Tract: Normal adrenal glands. No evidence of hydronephrosis, nephrolithiasis or enhancing renal mass. Stomach/Bowel: Stomach is within normal limits. Appendix appears normal. No evidence of bowel wall thickening, distention, or inflammatory changes. Lymphatic: No suspicious lymphadenopathy. Reproductive: Asymmetric enhancement of the right posterolateral prostate gland with patchy enhancement elsewhere. Findings are significantly more conspicuous compared to prior imaging. Other: No abdominal wall hernia or abnormality. No abdominopelvic ascites. Musculoskeletal: Multifocal sclerotic foci present throughout the skeleton. An index lesion in the left hemi sacrum measures slightly larger compared to the prior study at 0.9 cm compared to 0.8 cm previously. A second index lesion in the right transverse process of T1 measures 1.0 cm compared to 0.8 cm previously. Numerous additional lesions are present scattered throughout the thoracic and lumbar spine.  These lesions are significantly smaller and not dramatically changed. Lesions are also identified in both  iliac bones, bilateral ischial tuberosities, ribs and the right femoral head. The smaller of the 2 femoral head lesions also appears slightly enlarged. Review of the MIP images confirms the above findings. IMPRESSION: CTA CHEST 1. 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 This is just proximal to the origin of the right brachiocephalic artery which supplies both carotid arteries. This lesion is at high risk for embolization which could cause additional large vessel occlusion in the cerebral vasculature. The intimal irregularity could represent the form fruste of an aortic dissection if the patient has significant hypertension although the abnormality is confined to the intima. There is no evidence of thickening or hemorrhage within the aortic media. 2. Left main and 3 vessel coronary artery disease. CTA ABD/PELVIS 1. No evidence of acute aortic dissection or aneurysm. 2. However, there has been significant interval progression of irregular/ulcerated hypoechoic plaque and likely wall adherent mural thrombus within the visceral, renal and infrarenal abdominal aorta. 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 3. 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 There is irregular patchy enhancement throughout the prostate gland more (more on the right than the left) with numerous multifocal sclerotic bone lesions in the pelvis, right femoral head, lumbar and thoracic spine and ribs. At least a few of the small sclerotic foci have subtly enlarged compared to prior imaging. Recommend correlation with serum PSA and referral to urology. These results  were called by telephone at the time of interpretation on 11/03/2018 at 1:11 pm to Dr. Sherwood Gambler , who verbally acknowledged these results. Signed, Criselda Peaches, MD, [facility] Electronically Signed   By: Jacqulynn Cadet M.D.   On: 11/03/2018 13:59   Ct Head Code Stroke Wo Contrast  Result Date: 11/03/2018 CLINICAL DATA:  Code stroke.  Left arm numbness EXAM: CT HEAD WITHOUT CONTRAST TECHNIQUE: Contiguous axial images were obtained from the base of the skull through the vertex without intravenous contrast. COMPARISON:  06/30/2015 brain MRI FINDINGS: Brain: No evidence of acute infarction, hemorrhage, hydrocephalus, extra-axial collection or mass lesion/mass effect. Vascular: No hyperdense vessel or unexpected calcification. Skull: Normal. Negative for fracture or focal lesion. Sinuses/Orbits: No acute finding. Other: These results were called by telephone at the time of interpretation on 11/03/2018 at 11:39 am to Dr. Sherwood Gambler , who verbally acknowledged these results. ASPECTS [facility] Stroke Program Early CT Score) - Ganglionic level infarction (caudate, lentiform nuclei, internal capsule, insula, M1-M3 cortex): 7 - Supraganglionic infarction (M4-M6 cortex): 3 Total score (0-10 with 10 being normal): 10 IMPRESSION: Negative head CT.ASPECTS is 10. Electronically Signed   By: Monte Fantasia M.D.   On: 11/03/2018 11:41     Transthoracic echocardiogram 11/04/2018: IMPRESSIONS    1. Mild dyskinesis of the left ventricular, entire apical segment.  2. Severe hypokinesis of the left ventricular, mid-apical anteroseptal wall and anterior wall.  3. The left ventricle has moderately reduced systolic function, with an ejection fraction of 35-40%. The cavity size was normal. There is moderate concentric left ventricular hypertrophy. Left ventricular diastolic Doppler parameters are consistent with  impaired relaxation. There is  abnormal septal motion consistent with left bundle branch block. No evidence of left ventricular regional wall motion abnormalities.  4. The right ventricle  has normal systolic function. The cavity was normal. There is no increase in right ventricular wall thickness. Right ventricular systolic pressure could not be assessed.  5. Sclerosis without any evidence of stenosis of the aortic valve. Aortic valve regurgitation is trivial by color flow Doppler.  6. The aorta is normal unless otherwise noted.  7. The aortic root is normal in size and structure.  8. When compared to the prior study: 08/16/2018, no significant change is seen.   Subjective: Patient seen and examined at bedside, slightly anxious.  Breathing improved.  Titrated off of supplemental oxygen yesterday without any further desaturations.  No other complaints or concerns at this time.  Requesting note for work.  Denies headache, no fever/chills/night sweats, no nausea/vomiting/diarrhea, no chest pain, no palpitations, no abdominal pain, no weakness, no issues with bowel/bladder function, no cough/congestion, no paresthesias.  No acute events overnight per nursing staff.   Discharge Exam: Vitals:   11/12/18 0333 11/12/18 0830  BP: 113/82 (!) 124/93  Pulse: 86 87  Resp: 18 17  Temp: 98.6 F (37 C) 98 F (36.7 C)  SpO2: 100% 100%   Vitals:   11/11/18 1904 11/11/18 2321 11/12/18 0333 11/12/18 0830  BP: 117/90 105/80 113/82 (!) 124/93  Pulse: 99 98 86 87  Resp: 18 18 18 17   Temp: 98.2 F (36.8 C) 98.4 F (36.9 C) 98.6 F (37 C) 98 F (36.7 C)  TempSrc: Oral Oral Oral Oral  SpO2: 100% 100% 100% 100%  Weight:      Height:        General: Pt is alert, awake, not in acute distress Cardiovascular: RRR, S1/S2 +, no rubs, no gallops Respiratory: CTA bilaterally, no wheezing, no rhonchi Abdominal: Soft, NT, ND, bowel sounds + Extremities: no edema, no cyanosis    The results of significant diagnostics from this  hospitalization (including imaging, microbiology, ancillary and laboratory) are listed below for reference.     Microbiology: Recent Results (from the past 240 hour(s))  SARS CORONAVIRUS 2 (TAT 6-12 HRS) Nasal Swab Aptima Multi Swab     Status: None   Collection Time: 11/03/18 11:57 AM   Specimen: Aptima Multi Swab; Nasal Swab  Result Value Ref Range Status   SARS Coronavirus 2 NEGATIVE NEGATIVE Final    Comment: (NOTE) SARS-CoV-2 target nucleic acids are NOT DETECTED. The SARS-CoV-2 RNA is generally detectable in upper and lower respiratory specimens during the acute phase of infection. Negative results do not preclude SARS-CoV-2 infection, do not rule out co-infections with other pathogens, and should not be used as the sole basis for treatment or other patient management decisions. Negative results must be combined with clinical observations, patient history, and epidemiological information. The expected result is Negative. Fact Sheet for Patients: SugarRoll.be Fact Sheet for Healthcare Providers: https://www.woods-mathews.com/ This test is not yet approved or cleared by the [place] FDA and  has been authorized for detection and/or diagnosis of SARS-CoV-2 by FDA under an Emergency Use Authorization (EUA). This EUA will remain  in effect (meaning this test can be used) for the duration of the COVID-19 declaration under Section 56 4(b)(1) of the Act, 21 U.S.C. section 360bbb-3(b)(1), unless the authorization is terminated or revoked sooner. Performed at [facility], [place] [address], [place], [place] [address]   Culture, blood (Routine X 2) w Reflex to ID Panel     Status: None   Collection Time: 11/04/18 11:05 AM   Specimen: BLOOD RIGHT ARM  Result Value Ref Range Status  Specimen Description BLOOD RIGHT ARM  Final   Special Requests   Final    BOTTLES DRAWN AEROBIC AND ANAEROBIC Blood Culture adequate volume   Culture    Final    NO GROWTH 5 DAYS Performed at [facility], [address], [place], [place] [address]    Report Status 11/09/2018 FINAL  Final  Culture, blood (Routine X 2) w Reflex to ID Panel     Status: None   Collection Time: 11/04/18 11:09 AM   Specimen: BLOOD RIGHT HAND  Result Value Ref Range Status   Specimen Description BLOOD RIGHT HAND  Final   Special Requests   Final    BOTTLES DRAWN AEROBIC AND ANAEROBIC Blood Culture adequate volume   Culture   Final    NO GROWTH 5 DAYS Performed at [facility], [place] [address], [place], [place] [address]    Report Status 11/09/2018 FINAL  Final     Labs: BNP (last 3 results) No results for input(s): BNP in the last 8760 hours. Basic Metabolic Panel: Recent Labs  Lab 11/06/18 1404 11/07/18 0458 11/11/18 0425  NA 137 138 133*  K 3.4* 4.4 4.1  CL 104 108 96*  CO2 25 21* 25  GLUCOSE 127* 86 109*  BUN <5* 5* 13  CREATININE 0.70 0.57* 0.79  CALCIUM 8.5* 8.6* 9.2  MG 1.9  --  2.0   Liver Function Tests: No results for input(s): AST, ALT, ALKPHOS, BILITOT, PROT, ALBUMIN in the last 168 hours. No results for input(s): LIPASE, AMYLASE in the last 168 hours. No results for input(s): AMMONIA in the last 168 hours. CBC: Recent Labs  Lab 11/08/18 0426 11/09/18 0307 11/10/18 0316 11/11/18 0425 11/12/18 0325  WBC 6.4 5.8 5.9 5.3 5.3  HGB 9.2* 10.7* 11.6* 11.0* 10.7*  HCT 27.7* 32.4* 34.8* 32.6* 32.1*  MCV 79.1* 79.6* 78.4* 77.8* 77.3*  PLT 372 463* 471* 469* 456*   Cardiac Enzymes: No results for input(s): CKTOTAL, CKMB, CKMBINDEX, TROPONINI in the last 168 hours. BNP: Invalid input(s): POCBNP CBG: No results for input(s): GLUCAP in the last 168 hours. D-Dimer No results for input(s): DDIMER in the last 72 hours. Hgb A1c No results for input(s): HGBA1C in the last 72 hours. Lipid Profile No results for input(s): CHOL, HDL, LDLCALC, TRIG, CHOLHDL, LDLDIRECT in the last 72 hours. Thyroid function studies No  results for input(s): TSH, T4TOTAL, T3FREE, THYROIDAB in the last 72 hours.  Invalid input(s): FREET3 Anemia work up No results for input(s): VITAMINB12, FOLATE, FERRITIN, TIBC, IRON, RETICCTPCT in the last 72 hours. Urinalysis    Component Value Date/Time   COLORURINE YELLOW 11/03/2018 1304   APPEARANCEUR CLEAR 11/03/2018 1304   LABSPEC <1.005 (L) 11/03/2018 1304   PHURINE 6.5 11/03/2018 1304   GLUCOSEU NEGATIVE 11/03/2018 1304   HGBUR TRACE (A) 11/03/2018 1304   BILIRUBINUR NEGATIVE 11/03/2018 1304   KETONESUR NEGATIVE 11/03/2018 1304   PROTEINUR NEGATIVE 11/03/2018 1304   UROBILINOGEN 1.0 07/07/2014 1022   NITRITE NEGATIVE 11/03/2018 1304   LEUKOCYTESUR NEGATIVE 11/03/2018 1304   Sepsis Labs Invalid input(s): PROCALCITONIN,  WBC,  LACTICIDVEN Microbiology Recent Results (from the past 240 hour(s))  SARS CORONAVIRUS 2 (TAT 6-12 HRS) Nasal Swab Aptima Multi Swab     Status: None   Collection Time: 11/03/18 11:57 AM   Specimen: Aptima Multi Swab; Nasal Swab  Result Value Ref Range Status   SARS Coronavirus 2 NEGATIVE NEGATIVE Final    Comment: (NOTE) SARS-CoV-2 target nucleic acids are NOT  DETECTED. The SARS-CoV-2 RNA is generally detectable in upper and lower respiratory specimens during the acute phase of infection. Negative results do not preclude SARS-CoV-2 infection, do not rule out co-infections with other pathogens, and should not be used as the sole basis for treatment or other patient management decisions. Negative results must be combined with clinical observations, patient history, and epidemiological information. The expected result is Negative. Fact Sheet for Patients: SugarRoll.be Fact Sheet for Healthcare Providers: https://www.woods-mathews.com/ This test is not yet approved or cleared by the [place] FDA and  has been authorized for detection and/or diagnosis of SARS-CoV-2 by FDA under an Emergency Use  Authorization (EUA). This EUA will remain  in effect (meaning this test can be used) for the duration of the COVID-19 declaration under Section 56 4(b)(1) of the Act, 21 U.S.C. section 360bbb-3(b)(1), unless the authorization is terminated or revoked sooner. Performed at [facility], [place] [address] [place], [place] [address]   Culture, blood (Routine X 2) w Reflex to ID Panel     Status: None   Collection Time: 11/04/18 11:05 AM   Specimen: BLOOD RIGHT ARM  Result Value Ref Range Status   Specimen Description BLOOD RIGHT ARM  Final   Special Requests   Final    BOTTLES DRAWN AEROBIC AND ANAEROBIC Blood Culture adequate volume   Culture   Final    NO GROWTH 5 DAYS Performed at [facility], [place] [address], [place], [place] [address]    Report Status 11/09/2018 FINAL  Final  Culture, blood (Routine X 2) w Reflex to ID Panel     Status: None   Collection Time: 11/04/18 11:09 AM   Specimen: BLOOD RIGHT HAND  Result Value Ref Range Status   Specimen Description BLOOD RIGHT HAND  Final   Special Requests   Final    BOTTLES DRAWN AEROBIC AND ANAEROBIC Blood Culture adequate volume   Culture   Final    NO GROWTH 5 DAYS Performed at [facility], [place] [address], [place], [place] [address]    Report Status 11/09/2018 FINAL  Final     Time coordinating discharge: Over 30 minutes  SIGNED:   Feleshia Zundel J [place] DO  [facility] 11/12/2018, 8:58 AM

## 2018-11-16 ENCOUNTER — Other Ambulatory Visit: Payer: Self-pay

## 2018-11-16 ENCOUNTER — Ambulatory Visit (INDEPENDENT_AMBULATORY_CARE_PROVIDER_SITE_OTHER): Payer: Medicaid Other | Admitting: Pharmacist

## 2018-11-16 DIAGNOSIS — Z7901 Long term (current) use of anticoagulants: Secondary | ICD-10-CM

## 2018-11-16 DIAGNOSIS — Z8673 Personal history of transient ischemic attack (TIA), and cerebral infarction without residual deficits: Secondary | ICD-10-CM | POA: Diagnosis not present

## 2018-11-16 HISTORY — DX: Long term (current) use of anticoagulants: Z79.01

## 2018-11-16 LAB — POCT INR: INR: 2.3 (ref 2.0–3.0)

## 2018-11-22 ENCOUNTER — Ambulatory Visit (INDEPENDENT_AMBULATORY_CARE_PROVIDER_SITE_OTHER): Payer: Medicaid Other | Admitting: Pharmacist

## 2018-11-22 ENCOUNTER — Encounter: Payer: Self-pay | Admitting: Family Medicine

## 2018-11-22 ENCOUNTER — Ambulatory Visit (INDEPENDENT_AMBULATORY_CARE_PROVIDER_SITE_OTHER): Payer: Medicaid Other | Admitting: Family Medicine

## 2018-11-22 ENCOUNTER — Other Ambulatory Visit: Payer: Self-pay

## 2018-11-22 ENCOUNTER — Encounter: Payer: Self-pay | Admitting: *Deleted

## 2018-11-22 ENCOUNTER — Other Ambulatory Visit: Payer: Self-pay | Admitting: *Deleted

## 2018-11-22 VITALS — BP 122/84 | HR 74 | Temp 97.8°F | Ht 73.0 in | Wt 141.2 lb

## 2018-11-22 DIAGNOSIS — R972 Elevated prostate specific antigen [PSA]: Secondary | ICD-10-CM

## 2018-11-22 DIAGNOSIS — Z8673 Personal history of transient ischemic attack (TIA), and cerebral infarction without residual deficits: Secondary | ICD-10-CM

## 2018-11-22 DIAGNOSIS — Z720 Tobacco use: Secondary | ICD-10-CM

## 2018-11-22 DIAGNOSIS — Z7901 Long term (current) use of anticoagulants: Secondary | ICD-10-CM | POA: Diagnosis not present

## 2018-11-22 DIAGNOSIS — I25118 Atherosclerotic heart disease of native coronary artery with other forms of angina pectoris: Secondary | ICD-10-CM

## 2018-11-22 DIAGNOSIS — N400 Enlarged prostate without lower urinary tract symptoms: Secondary | ICD-10-CM

## 2018-11-22 DIAGNOSIS — Z716 Tobacco abuse counseling: Secondary | ICD-10-CM

## 2018-11-22 DIAGNOSIS — I214 Non-ST elevation (NSTEMI) myocardial infarction: Secondary | ICD-10-CM

## 2018-11-22 DIAGNOSIS — Z09 Encounter for follow-up examination after completed treatment for conditions other than malignant neoplasm: Secondary | ICD-10-CM

## 2018-11-22 DIAGNOSIS — I10 Essential (primary) hypertension: Secondary | ICD-10-CM

## 2018-11-22 DIAGNOSIS — E440 Moderate protein-calorie malnutrition: Secondary | ICD-10-CM

## 2018-11-22 LAB — POCT URINALYSIS DIPSTICK
Bilirubin, UA: NEGATIVE
Glucose, UA: NEGATIVE
Ketones, UA: NEGATIVE
Leukocytes, UA: NEGATIVE
Nitrite, UA: NEGATIVE
Protein, UA: NEGATIVE
Spec Grav, UA: >=1.03 — AB (ref 1.010–1.025)
Urobilinogen, UA: 0.2 E.U./dL
pH, UA: 6.5 (ref 5.0–8.0)

## 2018-11-22 LAB — POCT INR: INR: 1.2 — AB (ref 2.0–3.0)

## 2018-11-22 MED ORDER — WARFARIN SODIUM 10 MG PO TABS: ORAL_TABLET | ORAL | 0 refills | Status: DC

## 2018-11-22 NOTE — Patient Instructions (Signed)
*  Resume Lovenox twice daily* Take warfarin 20mg  today 9/14 and tomorrow 9/15, then increased dose to 20 daily except for 10mg  on Tuesdays and Thursdays. Repeat INR in 3 days

## 2018-11-22 NOTE — Progress Notes (Signed)
Patient [facility] Follow Up--New Patient--Establish Care  Subjective:  Patient ID: Daniel Weiss, male    DOB: [birth year]  Age: 58 y.o. MRN: [id number]  CC:  Chief Complaint  Patient presents with  . New Patient (Initial Visit)    establish care , hospital follow up     HPI Daniel Weiss is a 58 year old male who presents for Hospital Follow Up today.   Past Medical History:  Diagnosis Date  . Abnormal chest CT 02/2018   a. Ectatic 3 cm Ao arch - rec f/u outpt imaging w/ CTA or MRA. Ectatic atheromatous abd Ao @ risk for aneurysm - rec f/u u/s in 5 yrs. Marked prostatic enlargement w/ scattered small sclerotic foci in the thoracic lower lumbar spine, sacrum, left 12th rib, pelvis, and right femur.  Recommend elective outpatient whole-body bone scintigraphy and PSA.  Marland Kitchen CAD (coronary artery disease)    a. 02/2018 ACS/PCI: LM nl, LAD 85p (3.5x15 Sierra DES), D1 45ost, RI 55ost, RCA nl, EF 25-35%.  . Complete left bundle branch block (LBBB) 2016  . Current every day smoker   . HFrEF (heart failure with reduced ejection fraction) ([place])   . Hypertension   . Ischemic cardiomyopathy 02/2018   a. 02/2018 Echo: EF 35-40% (LV gram on cath was 25-30%), mod, diff HK. mid-apicalanteroseptal, ant, and apical sev HK. RVH.  Marland Kitchen Myocardial infarction ([place])   . Stroke ([place])   . TIA (transient ischemic attack) 2016  . Warthin's tumor    Current Status: This will be Mr. Clarida initial office visit with me. He was not seeing a physician for his PCP needs. Since his last Hospital Admission on 11/03/2018-11/12/2018 for Acute Cardio Embolic Stroke. He was also admitted on 03/07/2019- 03/10/2018 for NSTEMI. Today, he states that he is doing well with no complaints. Recent stroke on 11/03/2018. Elevated PSA noted since 2019. He is currently following up with Coumadin Clinic, which he is taking Warfarin 10 mg daily and Lovenox 60 mg twice daily. He is scheduled to  see Cardiologist on 11/29/2018. He is currently smoking 2-3 cigarettes daily. He is to follow up with Oncology for evaluation of possible Prostate Cancer. He has not been able to work at this time. He denies visual changes, chest pain, cough, shortness of breath, heart palpitations, and falls. He has occasional headaches and dizziness with position changes. Denies severe headaches, confusion, seizures, double vision, and blurred vision, nausea and vomiting.  He denies fevers, chills, fatigue, recent infections, weight loss, and night sweats. No reports of GI problems such as diarrhea, and constipation. He has no reports of blood in stools, dysuria and hematuria. No depression or anxiety reported today. He denies pain today.   Past Surgical History:  Procedure Laterality Date  . CORONARY STENT INTERVENTION N/A 03/06/2018   Procedure: CORONARY STENT INTERVENTION;  Surgeon: [name], MD;  Location: [place] [facility];  Service: Cardiovascular: Proximal LAD 85% stenosis (and D1): DES PCI with Xience Sierra DES 3.5 mm x 15 mm - 3.9 mm  . INTRAOPERATIVE TRANSTHORACIC ECHOCARDIOGRAM  03/06/2018   (Peri-MI) mild reduced EF 35-40%.  Diffuse hypokinesis (severe HK of the mid-apical anteroseptal, anterior and apical myocardium consistent with LAD infarct).  Unable to assess diastolic function.  Paradoxical septal motion likely related to his LBBB.  RV hypertrophy noted.  Trivial pericardial effusion noted.   Marland Kitchen LEFT HEART CATH AND CORONARY ANGIOGRAPHY N/A 03/06/2018   Procedure: LEFT HEART CATH  AND CORONARY ANGIOGRAPHY;  Surgeon: [name], MD;  Location: [facility];  Service: Cardiovascular: Proximal LAD 85% involving D1 45%.  Ostial RI 55%.  Severe LV dysfunction EF 25 to 30%.  1+ MR.  Only minimally elevated LVEDP.    Family History  Problem Relation Age of Onset  . Stroke Mother   . Hypertension Mother   . Diabetes type II Mother   . Leukemia Other   . Breast cancer Other   .  Stroke Brother   . CAD Neg Hx     Social History   Socioeconomic History  . Marital status: Married    Spouse name: Not on file  . Number of children: Not on file  . Years of education: Not on file  . Highest education level: Not on file  Occupational History  . Not on file  Social Needs  . Financial resource strain: Not on file  . Food insecurity    Worry: Not on file    Inability: Not on file  . Transportation needs    Medical: Not on file    Non-medical: Not on file  Tobacco Use  . Smoking status: Current Every Day Smoker    Packs/day: 0.50    Types: Cigarettes  . Smokeless tobacco: Former Systems developer    Quit date: 02/23/2018  Substance and Sexual Activity  . Alcohol use: Yes    Alcohol/week: 2.0 standard drinks    Types: 2 Cans of beer per week    Comment: 2 Beers daily.  . Drug use: Yes    Frequency: 3.0 times per week    Types: Marijuana  . Sexual activity: Not on file  Lifestyle  . Physical activity    Days per week: Not on file    Minutes per session: Not on file  . Stress: Not on file  Relationships  . Social Herbalist on phone: Not on file    Gets together: Not on file    Attends religious service: Not on file    Active member of club or organization: Not on file    Attends meetings of clubs or organizations: Not on file    Relationship status: Not on file  . Intimate partner violence    Fear of current or ex partner: Not on file    Emotionally abused: Not on file    Physically abused: Not on file    Forced sexual activity: Not on file  Other Topics Concern  . Not on file  Social History Narrative  . Not on file    Outpatient Medications Prior to Visit  Medication Sig Dispense Refill  . carvedilol (COREG) 6.25 MG tablet Take 1 tablet (6.25 mg total) by mouth 2 (two) times daily with a meal. 60 tablet 0  . enoxaparin (LOVENOX) 60 MG/0.6ML injection Inject 0.6 mLs (60 mg total) into the skin every 12 (twelve) hours for 7 days. 8.4 mL 0  .  lisinopril (ZESTRIL) 5 MG tablet Take 1 tablet (5 mg total) by mouth daily. 30 tablet 0  . nitroGLYCERIN (NITROSTAT) 0.4 MG SL tablet Place 1 tablet (0.4 mg total) under the tongue every 5 (five) minutes as needed for chest pain. 25 tablet 3  . tamsulosin (FLOMAX) 0.4 MG CAPS capsule Take 1 capsule (0.4 mg total) by mouth daily after supper. 30 capsule 0  . warfarin (COUMADIN) 10 MG tablet Take 1-2 tablets daily as directed by coumadin clinic 60 tablet 0  . acetaminophen (TYLENOL)  325 MG tablet Take 2 tablets (650 mg total) by mouth every 4 (four) hours as needed for mild pain (temperature >/= 99.5 F). (Patient not taking: Reported on 11/22/2018) 90 tablet 0  . aspirin EC 81 MG EC tablet Take 1 tablet (81 mg total) by mouth daily. (Patient not taking: Reported on 11/22/2018) 90 tablet 0  . atorvastatin (LIPITOR) 80 MG tablet Take 1 tablet (80 mg total) by mouth daily. (Patient not taking: Reported on 11/22/2018) 30 tablet 6   No facility-administered medications prior to visit.     No Known Allergies  ROS Review of Systems  Constitutional: Negative.   HENT: Negative.   Eyes: Negative.   Respiratory: Negative.   Cardiovascular: Negative.   Gastrointestinal: Negative.   Endocrine: Negative.   Genitourinary: Negative.   Musculoskeletal: Negative.   Skin: Negative.   Allergic/Immunologic: Negative.   Neurological: Negative.   Hematological: Negative.   Psychiatric/Behavioral: Negative.       Objective:    Physical Exam  Constitutional: He is oriented to person, place, and time. He appears well-developed and well-nourished.  HENT:  Head: Normocephalic and atraumatic.  Eyes: Conjunctivae are normal.  Neck: Normal range of motion. Neck supple.  Right neck swelling.   Cardiovascular: Normal rate, regular rhythm, normal heart sounds and intact distal pulses.  Pulmonary/Chest: Effort normal and breath sounds normal.  Abdominal: Soft. Bowel sounds are normal.  Musculoskeletal: Normal  range of motion.  Neurological: He is alert and oriented to person, place, and time. He has normal reflexes.  Skin: Skin is warm and dry.  Psychiatric: He has a normal mood and affect. His behavior is normal. Judgment and thought content normal.    BP 122/84 (BP Location: Left Arm, Patient Position: Sitting, Cuff Size: Normal)   Pulse 74   Temp 97.8 F (36.6 C) (Oral)   Ht 6\' 1"  (1.854 m)   Wt 141 lb 3.2 oz (64 kg)   SpO2 100%   BMI 18.63 kg/m  Wt Readings from Last 3 Encounters:  11/22/18 141 lb 3.2 oz (64 kg)  11/03/18 131 lb (59.4 kg)  09/19/18 130 lb (59 kg)   Health Maintenance Due  Topic Date Due  . Hepatitis C Screening  1960-12-11  . TETANUS/TDAP  01/30/1979  . COLONOSCOPY  01/29/2010  . INFLUENZA VACCINE  10/09/2018    There are no preventive care reminders to display for this patient.  Lab Results  Component Value Date   TSH 0.621 07/07/2014   Lab Results  Component Value Date   WBC 5.3 11/12/2018   HGB 10.7 (L) 11/12/2018   HCT 32.1 (L) 11/12/2018   MCV 77.3 (L) 11/12/2018   PLT 456 (H) 11/12/2018   Lab Results  Component Value Date   NA 133 (L) 11/11/2018   K 4.1 11/11/2018   CO2 25 11/11/2018   GLUCOSE 109 (H) 11/11/2018   BUN 13 11/11/2018   CREATININE 0.79 11/11/2018   BILITOT 0.4 11/03/2018   ALKPHOS 84 11/03/2018   AST 16 11/03/2018   ALT 9 11/03/2018   PROT 6.6 11/03/2018   ALBUMIN 2.9 (L) 11/03/2018   CALCIUM 9.2 11/11/2018   ANIONGAP 12 11/11/2018   Lab Results  Component Value Date   CHOL 118 11/04/2018   Lab Results  Component Value Date   HDL 37 (L) 11/04/2018   Lab Results  Component Value Date   LDLCALC 67 11/04/2018   Lab Results  Component Value Date   TRIG 70 11/04/2018   Lab Results  Component Value Date   CHOLHDL 3.2 11/04/2018   Lab Results  Component Value Date   HGBA1C 5.3 11/04/2018      Assessment & Plan:   1. Hospital discharge follow-up  2. Coronary artery disease with stable angina pectoris,  unspecified vessel or lesion type, unspecified whether native or transplanted heart ([place])  3. History of CVA (cerebrovascular accident) Stable. No signs or symptoms of recurrence.   4. NSTEMI Stable. No signs or symptoms of recurrence noted or reported.   5. Enlarged prostate   6. Elevated PSA  7. Essential hypertension The current medical regimen is effective; blood pressure is stable at 122/84 today; continue present plan and medications as prescribed. He will continue to decrease high sodium intake, excessive alcohol intake, increase potassium intake, smoking cessation, and increase physical activity of at least 30 minutes of cardio activity daily. He will continue to follow Heart Healthy or DASH diet. - POCT Urinalysis Dipstick - CBC with Differential - Comprehensive metabolic panel - Lipid Panel - TSH - PSA - Vitamin B12 - Vitamin D, 25-hydroxy  8. Long term (current) use of anticoagulants He will continue Warfarin and Lovenox as prescribed. Continue follow up appointment with coumadin clinic.   9. Malnutrition of moderate degree  10. Tobacco use  11. Encounter for smoking cessation  12. Follow up He will follow up in 3 months or sooner if needed. We will refer him to Oncology for evaluation of elevated PSA.    Orders Placed This Encounter  Procedures  . CBC with Differential  . Comprehensive metabolic panel  . Lipid Panel  . TSH  . PSA  . Vitamin B12  . Vitamin D, 25-hydroxy  . POCT Urinalysis Dipstick    Referral Orders  No referral(s) requested today    Kathe Becton,  MSN, FNP-BC [place] [address] [place], [place] [address] [phone] fax   Problem List Items Addressed This Visit      Cardiovascular and Mediastinum   Essential hypertension - Primary (Chronic)   Relevant Orders   POCT Urinalysis Dipstick (Completed)   CBC with Differential    Comprehensive metabolic panel   Lipid Panel   TSH   PSA   Vitamin B12   Vitamin D, 25-hydroxy     Other   Enlarged prostate   Long term (current) use of anticoagulants   Malnutrition of moderate degree    Other Visit Diagnoses    Hospital discharge follow-up       Coronary artery disease with stable angina pectoris, unspecified vessel or lesion type, unspecified whether native or transplanted heart ([place])       History of CVA (cerebrovascular accident)       Tobacco use       Follow up          No orders of the defined types were placed in this encounter.   Follow-up: Return in about 3 months (around 02/21/2019).    Azzie Glatter, FNP

## 2018-11-22 NOTE — Patient Outreach (Signed)
[facility]) Care Management  11/22/2018  Daniel Weiss 16-Sep-1960 [id number]   EMMI- stroke  RED ON EMMI ALERT Day # 6 Date: Friday 11/19/18 1003 Red Alert Reason: Smoked or been around smoke? Yes   Insurance: medicaid [place] access [facility] admissions x 1 on 11/03/18- 11/12/18 CVA ED visits x 2 ( 09/19/18 [facility] &  11/03/18 [facility]) in the last 6 months    Outreach attempt # 1 successful at the home number  Patient is able to verify HIPAA, DOB and address [facility] Care Management RN reviewed and addressed red alert with patient He did agree that it was okay if [facility] RN CM had to call him again   EMMI:  Mr Daniel Weiss informs [facility] RN CM that he has been smoking and is not interested in smoke cessation nor resources  He is currently smoking 2-3 cigarettes daily.  He was seen by FNP, Kathe Becton today and states he was not offered smoke cessation resources as he does not want it   When assessed he denies any medication, DME, or home care concerns at this time   Social:  Mr Daniel Weiss is living with his sister. He previously was living independently as a cook in [employer] He is independent/assist with care needs He denies concerns with transportation to medical appointments   Conditions: CVA/Small acute right frontoparietal infarct 11/03/18, HTN, CAD, NSTEMI, Complete Left bundle branch block,  left sided weakness, Acute renal failure, tobacco abuse/ hx of marijuana use, HLD, headache syndrome, right neck mass/Warthin's tumor, hx of TIA in 2016, sinus tachycardia, malnutrition of moderate degree, hypokalemia, hyponatremia, dehydration, hx of prostate cancer with osseous metastases followed by oncology chronic systolic CHF COVID negaticve 11/03/18 Denies falls  DME: He denies need of DME  Medications: He denies concerns with taking medications as prescribed, affording medications, side effects of medications and questions about medications He reports just getting his medications filled  today 11/22/18   Appointments: coumadin clinic 11/16/18 1030 NP, Kathe Becton seen 11/22/18  11/23/18 neurology 11/29/18 1340 cardiologist Dr Ellyn Hack  Outpatient rehab    Advance Directives: he reports on 11/22/18 that Ronnise and another family member are POA    Consent: [facility] RN CM reviewed [facility] services with patient. Patient gave verbal consent for services [facility] telephonic RN CM.   Advised patient that there will be further automated EMMI- post discharge calls to assess how the patient is doing following the recent hospitalization Advised the patient that another call may be received from a nurse if any of their responses were abnormal. Patient voiced understanding and was appreciative of f/u call.   Plan: [facility] RN CM will close case at this time as patient has been assessed and no needs identified/needs resolved.   Pt encouraged to return a call to [place] CM prn  [facility] RN CM sent a successful outreach letter as discussed with [facility] brochure enclosed for review  Routed note to MDs  Joelene Millin L. Lavina Hamman, RN, BSN, [place] Coordinator Office number [phone] Mobile number [phone]  Main [facility] number [phone] Fax number [phone]

## 2018-11-23 ENCOUNTER — Encounter: Payer: Self-pay | Admitting: Family Medicine

## 2018-11-23 ENCOUNTER — Encounter: Payer: Self-pay | Admitting: Physical Therapy

## 2018-11-23 ENCOUNTER — Ambulatory Visit: Payer: Medicaid Other | Admitting: Physical Therapy

## 2018-11-23 DIAGNOSIS — I25118 Atherosclerotic heart disease of native coronary artery with other forms of angina pectoris: Secondary | ICD-10-CM | POA: Insufficient documentation

## 2018-11-23 DIAGNOSIS — Z8673 Personal history of transient ischemic attack (TIA), and cerebral infarction without residual deficits: Secondary | ICD-10-CM | POA: Insufficient documentation

## 2018-11-23 DIAGNOSIS — R972 Elevated prostate specific antigen [PSA]: Secondary | ICD-10-CM | POA: Insufficient documentation

## 2018-11-23 HISTORY — DX: Personal history of transient ischemic attack (TIA), and cerebral infarction without residual deficits: Z86.73

## 2018-11-23 LAB — LIPID PANEL
Chol/HDL Ratio: 3.7 ratio (ref 0.0–5.0)
Cholesterol, Total: 212 mg/dL — ABNORMAL HIGH (ref 100–199)
HDL: 57 mg/dL (ref >39)
LDL Chol Calc (NIH): 130 mg/dL — ABNORMAL HIGH (ref 0–99)
Triglycerides: 142 mg/dL (ref 0–149)
VLDL Cholesterol Cal: 25 mg/dL (ref 5–40)

## 2018-11-23 LAB — CBC WITH DIFFERENTIAL/PLATELET
Basophils Absolute: 0.1 10*3/uL (ref 0.0–0.2)
Basos: 1 %
EOS (ABSOLUTE): 0.1 10*3/uL (ref 0.0–0.4)
Eos: 1 %
Hematocrit: 33.3 % — ABNORMAL LOW (ref 37.5–51.0)
Hemoglobin: 10.3 g/dL — ABNORMAL LOW (ref 13.0–17.7)
Immature Grans (Abs): 0 10*3/uL (ref 0.0–0.1)
Immature Granulocytes: 0 %
Lymphocytes Absolute: 2.3 10*3/uL (ref 0.7–3.1)
Lymphs: 30 %
MCH: 25.4 pg — ABNORMAL LOW (ref 26.6–33.0)
MCHC: 30.9 g/dL — ABNORMAL LOW (ref 31.5–35.7)
MCV: 82 fL (ref 79–97)
Monocytes Absolute: 0.9 10*3/uL (ref 0.1–0.9)
Monocytes: 13 %
Neutrophils Absolute: 4.1 10*3/uL (ref 1.4–7.0)
Neutrophils: 55 %
Platelets: 748 10*3/uL — ABNORMAL HIGH (ref 150–450)
RBC: 4.05 x10E6/uL — ABNORMAL LOW (ref 4.14–5.80)
RDW: 16.5 % — ABNORMAL HIGH (ref 11.6–15.4)
WBC: 7.4 10*3/uL (ref 3.4–10.8)

## 2018-11-23 LAB — COMPREHENSIVE METABOLIC PANEL
ALT: 11 IU/L (ref 0–44)
AST: 14 IU/L (ref 0–40)
Albumin/Globulin Ratio: 1.2 (ref 1.2–2.2)
Albumin: 3.8 g/dL (ref 3.8–4.9)
Alkaline Phosphatase: 59 IU/L (ref 39–117)
BUN/Creatinine Ratio: 19 (ref 9–20)
BUN: 15 mg/dL (ref 6–24)
Bilirubin Total: <0.2 mg/dL (ref 0.0–1.2)
CO2: 24 mmol/L (ref 20–29)
Calcium: 9.6 mg/dL (ref 8.7–10.2)
Chloride: 100 mmol/L (ref 96–106)
Creatinine, Ser: 0.77 mg/dL (ref 0.76–1.27)
GFR calc Af Amer: 116 mL/min/{1.73_m2} (ref >59)
GFR calc non Af Amer: 100 mL/min/{1.73_m2} (ref >59)
Globulin, Total: 3.3 g/dL (ref 1.5–4.5)
Glucose: 88 mg/dL (ref 65–99)
Potassium: 4.6 mmol/L (ref 3.5–5.2)
Sodium: 136 mmol/L (ref 134–144)
Total Protein: 7.1 g/dL (ref 6.0–8.5)

## 2018-11-23 LAB — TSH: TSH: 1.95 u[IU]/mL (ref 0.450–4.500)

## 2018-11-23 LAB — VITAMIN B12: Vitamin B-12: 653 pg/mL (ref 232–1245)

## 2018-11-23 LAB — PSA: Prostate Specific Ag, Serum: 377 ng/mL — ABNORMAL HIGH (ref 0.0–4.0)

## 2018-11-23 LAB — VITAMIN D 25 HYDROXY (VIT D DEFICIENCY, FRACTURES): Vit D, 25-Hydroxy: 24.3 ng/mL — ABNORMAL LOW (ref 30.0–100.0)

## 2018-11-23 NOTE — Therapy (Signed)
[facility] [address]  [place], [place], [address] Phone: [phone]   Fax:  [phone]  Patient Details  Name: Daniel Weiss MRN: [id number] Date of Birth: November 21, 1960 Referring Provider:  [place] Eric J, DO  Encounter Date: 11/23/2018   Patient arrived to OPPT evaluation s/p acute cardioembolic stroke. Patient reporting "I am not sure why I am here." and reporting that he has been performing all ADLs without limitation at home, with the exception of his sister driving him to appointments. Gave patient the option to complete today's assessment to screen for any remaining impairments- patient declined. Upon review of chart, acute PT recommended no further PT needs at time of hospital admission. Patient left without being seen today.   Janene Harvey, PT, DPT 11/23/18 3:45 PM   [facility] [address]  [address] [place], [place], [address] Phone: [phone]   Fax:  [phone]

## 2018-11-24 ENCOUNTER — Telehealth: Payer: Self-pay

## 2018-11-24 NOTE — Telephone Encounter (Signed)
-----   Message from Azzie Glatter, [place] sent at 11/23/2018  7:44 PM EDT ----- Regarding: "Oncology Referral" Referral sent for Oncology assessment of elevated PSA today. Please inform patient. Thank you.

## 2018-11-24 NOTE — Telephone Encounter (Signed)
Called and l/m for pt call back to let know that we sent a referral appt for his psa level.

## 2018-11-25 ENCOUNTER — Other Ambulatory Visit: Payer: Self-pay | Admitting: Family Medicine

## 2018-11-25 ENCOUNTER — Telehealth: Payer: Self-pay

## 2018-11-25 DIAGNOSIS — N400 Enlarged prostate without lower urinary tract symptoms: Secondary | ICD-10-CM

## 2018-11-25 DIAGNOSIS — R972 Elevated prostate specific antigen [PSA]: Secondary | ICD-10-CM

## 2018-11-25 NOTE — Telephone Encounter (Signed)
Called spoke with patient to inform him about this.

## 2018-11-25 NOTE — Telephone Encounter (Signed)
Called and spoke with patient to inform him of this.

## 2018-11-25 NOTE — Telephone Encounter (Signed)
-----   Message from Azzie Glatter, [place] sent at 11/23/2018  7:44 PM EDT ----- Regarding: "Oncology Referral" Referral sent for Oncology assessment of elevated PSA today. Please inform patient. Thank you.

## 2018-11-26 ENCOUNTER — Ambulatory Visit (INDEPENDENT_AMBULATORY_CARE_PROVIDER_SITE_OTHER): Payer: Medicaid Other | Admitting: Pharmacist Clinician (PhC)/ Clinical Pharmacy Specialist

## 2018-11-26 ENCOUNTER — Other Ambulatory Visit: Payer: Self-pay

## 2018-11-26 DIAGNOSIS — Z8673 Personal history of transient ischemic attack (TIA), and cerebral infarction without residual deficits: Secondary | ICD-10-CM

## 2018-11-26 DIAGNOSIS — Z7901 Long term (current) use of anticoagulants: Secondary | ICD-10-CM | POA: Diagnosis not present

## 2018-11-26 LAB — POCT INR: INR: 2.8 (ref 2.0–3.0)

## 2018-11-29 ENCOUNTER — Ambulatory Visit (INDEPENDENT_AMBULATORY_CARE_PROVIDER_SITE_OTHER): Payer: Medicaid Other | Admitting: Pharmacist Clinician (PhC)/ Clinical Pharmacy Specialist

## 2018-11-29 ENCOUNTER — Other Ambulatory Visit: Payer: Self-pay

## 2018-11-29 ENCOUNTER — Ambulatory Visit (INDEPENDENT_AMBULATORY_CARE_PROVIDER_SITE_OTHER): Payer: Medicaid Other | Admitting: Cardiology

## 2018-11-29 ENCOUNTER — Encounter: Payer: Self-pay | Admitting: Cardiology

## 2018-11-29 VITALS — BP 120/68 | HR 78 | Temp 97.3°F | Ht 72.0 in | Wt 144.4 lb

## 2018-11-29 DIAGNOSIS — E785 Hyperlipidemia, unspecified: Secondary | ICD-10-CM

## 2018-11-29 DIAGNOSIS — I7410 Embolism and thrombosis of unspecified parts of aorta: Secondary | ICD-10-CM

## 2018-11-29 DIAGNOSIS — I447 Left bundle-branch block, unspecified: Secondary | ICD-10-CM | POA: Diagnosis not present

## 2018-11-29 DIAGNOSIS — I10 Essential (primary) hypertension: Secondary | ICD-10-CM | POA: Diagnosis not present

## 2018-11-29 DIAGNOSIS — Z7901 Long term (current) use of anticoagulants: Secondary | ICD-10-CM | POA: Diagnosis not present

## 2018-11-29 DIAGNOSIS — Z955 Presence of coronary angioplasty implant and graft: Secondary | ICD-10-CM

## 2018-11-29 DIAGNOSIS — I25119 Atherosclerotic heart disease of native coronary artery with unspecified angina pectoris: Secondary | ICD-10-CM | POA: Diagnosis not present

## 2018-11-29 DIAGNOSIS — I255 Ischemic cardiomyopathy: Secondary | ICD-10-CM | POA: Diagnosis not present

## 2018-11-29 DIAGNOSIS — Z8673 Personal history of transient ischemic attack (TIA), and cerebral infarction without residual deficits: Secondary | ICD-10-CM

## 2018-11-29 HISTORY — DX: Embolism and thrombosis of unspecified parts of aorta: I74.10

## 2018-11-29 LAB — POCT INR: INR: 4 — AB (ref 2.0–3.0)

## 2018-11-29 MED ORDER — ENTRESTO 24-26 MG PO TABS: 1.0000 | ORAL_TABLET | Freq: Two times a day (BID) | ORAL | 4 refills | Status: DC

## 2018-11-29 NOTE — Patient Instructions (Addendum)
Medication Instructions: -STOP LISINOPRIL AFTER 2 DAYS  -START ENTRESTO 24/26 MG ONE TABLET TWICE A DAY   - DECREASE CARVEDILOL TO HALF A TABLET TWICE A DAY  UNTIL YOUR APPOINTMENT WITH CVRR   Lab work:  NOT NEEDED  Testing/Procedures:  NOT NEEDED  Follow-Up: At [facility], you and your health needs are our priority.  As part of our continuing mission to provide you with exceptional heart care, we have created designated Provider Care Teams.  These Care Teams include your primary Cardiologist (physician) and Advanced Practice Providers (APPs -  Physician Assistants and Nurse Practitioners) who all work together to provide you with the care you need, when you need it. . You will need a follow up appointment in [place] 2021.  Please call our office 2 months in advance to schedule this appointment.  You may see Glenetta Hew, MD or one of the following Advanced Practice Providers on your designated Care Team:   . Rosaria Ferries, PA-C . Jory Sims, DNP, ANP- Your physician recommends that you schedule a follow-up appointment in [place] physician recommends that you schedule a follow-up appointment in 2 WEEKS - BLOOD PRESSURE AND INR- WITH CVRR   You have been referred to ELECTROPHYSIOLOGIST-  POSSIBLE ICD OR CRT    Any Other Special Instructions Will Be Listed Below (If Applicable).

## 2018-11-29 NOTE — Progress Notes (Signed)
PCP: Azzie Glatter, FNP  Clinic Note: Chief Complaint  Patient presents with  . Hospitalization Follow-up    Recently admitted for CVA/TIA.  Found to have aortic thrombus  . Coronary Artery Disease  . Cardiomyopathy    HPI: Daniel Weiss is a 58 y.o. male with a PMH notable for Anterior STEMI (pLAD PCI) with ICM (EF 35-40%) & LBBB who presents for ~5 month f/u  -- notable change is recent CVA.   Daniel Weiss was last seen on 06/25/2018 TeleHealth - doing well. No moe Angina or CHF Sx.  Still runs out of breath easily.  Tires out quickly & intermittent dizzy spells.   Recent Hospitalizations:  8/26/ - 11/12/2018 -   Acute L sided weakness --> Acute CVA (TIA) - noted to have potential Aortic Thrombus -- changed form Brlinta to Plavix & started on Warfarin (similar to Rx of LV Thrombus). -->  Also diagnosed with metastatic prostate cancer  I did see him in consult while in the hospital (CT surgery was also consulted) -> chest CTA revealed patent proximal LAD stent.  Did note a threadlike density in the ascending aorta thought to be thrombus (less likely plan dissection) --> recommendation was to start anticoagulation with warfarin and convert from ticagrelor to clopidogrel and stop aspirin.  Coreg was reduced to 6.25 mg twice daily and lisinopril was reduced to 5 mg a day.  Studies Personally Reviewed - (if available, images/films reviewed: From Epic Chart or Care Everywhere)  Echo 08/16/2018: EF remains 35 to 40%.  GR 1 DD.  Apical anterior and lateral as well as mid anteroseptal and apical septal akinesis.  Elevated RV filling pressures with normal function.  Mild aortic valve sclerosis but no stenosis.  Echo 11/04/2018 (TIA/CVA): Moderate reduced EF 35 to 40%.  Moderate concentric LVH.  Mild dyskinesis of the apex and severe hypokinesis of mid apical anteroseptal and anterior wall.  Unable to assess RV pressures.  Aortic sclerosis with no stenosis.  No significant change seen  Interval  History: Daniel Weiss is here today pretty much in a somber mood.  With verbal "about not being on to go back to work.  He is very concerned about his prognosis with the prostate cancer -> still waiting to hear about a biopsy.  He is working out his disability paperwork. From a cardiac standpoint he does note some fatigue and a little bit exertional dyspnea if he overdoes it.  No further chest pain with rest or exertion. No PND with mild orthopnea and no notable edema. No palpitations, lightheadedness, dizziness, weakness or syncope/near syncope. No further symptoms of TIA/amaurosis fugax symptoms. No bleeding concerns melena, hematochezia, hematuria, or epstaxis.  Minimal bruising since stopping aspirin. No claudication.  ROS: A comprehensive was performed. Review of Systems  Constitutional: Negative for weight loss (He had lost quite a bit of weight prior to his hospitalization.  His starting to put some of the weight back on again now.).  HENT: Negative for congestion and nosebleeds.   Respiratory: Positive for cough (Morning cough) and shortness of breath (A little bit with exertion).   Gastrointestinal: Positive for abdominal pain (Off and on). Negative for constipation.  Musculoskeletal: Negative for joint pain.  Neurological: Positive for headaches. Negative for dizziness.  Psychiatric/Behavioral: Negative for depression (Not so sure if this is true depression or just being upset about his diagnosis.). The patient is nervous/anxious (Very anxious about his diagnosis of cancer and his CHF).   All other systems reviewed and are negative.  I have reviewed and (if needed) personally updated the patient's problem list, medications, allergies, past medical and surgical history, social and family history.   Past Medical History:  Diagnosis Date  . Abnormal chest CT 02/2018   a. Ectatic 3 cm Ao arch - rec f/u outpt imaging w/ CTA or MRA. Ectatic atheromatous abd Ao @ risk for aneurysm - rec f/u u/s  in 5 yrs. Marked prostatic enlargement w/ scattered small sclerotic foci in the thoracic lower lumbar spine, sacrum, left 12th rib, pelvis, and right femur.  Recommend elective outpatient whole-body bone scintigraphy and PSA.  Marland Kitchen CAD (coronary artery disease)    a. 02/2018 ACS/PCI: LM nl, LAD 85p (3.5x15 Sierra DES), D1 45ost, RI 55ost, RCA nl, EF 25-35%.  . Complete left bundle branch block (LBBB) 2016  . Current every day smoker   . Elevated PSA 11/2018  . Enlarged prostate   . HFrEF (heart failure with reduced ejection fraction) ([place])   . Hypertension   . Ischemic cardiomyopathy 02/2018   a. 02/2018 Echo: EF 35-40% (LV gram on cath was 25-30%), mod, diff HK. mid-apicalanteroseptal, ant, and apical sev HK. RVH.-->  No notable change on echo from June 2020 and August 2020.  Marland Kitchen Myocardial infarction ([place])   . NSTEMI (non-ST elevated myocardial infarction) ([place]) 02/2018  . Stroke ([place])   . TIA (transient ischemic attack) 2016  . Warthin's tumor     Past Surgical History:  Procedure Laterality Date  . CORONARY STENT INTERVENTION N/A 03/06/2018   Procedure: CORONARY STENT INTERVENTION;  Surgeon: [name], MD;  Location: [facility];  Service: Cardiovascular: Proximal LAD 85% stenosis (and D1): DES PCI with Xience Sierra DES 3.5 mm x 15 mm - 3.9 mm  . INTRAOPERATIVE TRANSTHORACIC ECHOCARDIOGRAM  03/06/2018   (Peri-MI) mild reduced EF 35-40%.  Diffuse hypokinesis (severe HK of the mid-apical anteroseptal, anterior and apical myocardium consistent with LAD infarct).  Unable to assess diastolic function.  Paradoxical septal motion likely related to his LBBB.  RV hypertrophy noted.  Trivial pericardial effusion noted.   Marland Kitchen LEFT HEART CATH AND CORONARY ANGIOGRAPHY N/A 03/06/2018   Procedure: LEFT HEART CATH AND CORONARY ANGIOGRAPHY;  Surgeon: [name], MD;  Location: [facility];  Service: Cardiovascular: Proximal LAD 85% involving D1 45%.  Ostial RI 55%.  Severe LV  dysfunction EF 25 to 30%.  1+ MR.  Only minimally elevated LVEDP.  Marland Kitchen TRANSTHORACIC ECHOCARDIOGRAM  10/2018   (Most recent echo, to evaluate for TIA/CVA) : Moderate reduced EF 35 to 40%.  Moderate concentric LVH.  Mild dyskinesis of the apex and severe hypokinesis of mid apical anteroseptal and anterior wall.  Unable to assess RV pressures.  Aortic sclerosis with no stenosis.  No significant change seen    Cardiac Cath-PCI 03/06/2018: Proximal LAD 85% stenosis involving D1 (DES PCI with Xience Sierra 3.5 mm x 15 mm-3.9 mm).  Ostial RI 55%.  Severe LV dysfunction with EF of 25-35%.  1+ MR.  Minimally elevated LVEDP..     Transthoracic Echo 03/06/2018: Mild reduced EF 35-40%.  Diffuse hypokinesis (severe HK of the mid-apical anteroseptal, anterior and apical myocardium consistent with LAD infarct).  Unable to assess diastolic function.  Paradoxical septal motion likely related to his left bundle branch block.  RV hypertrophy noted.  Trivial pericardial effusion noted.  Current Meds  Medication Sig  . carvedilol (COREG) 6.25 MG tablet Take 1 tablet (6.25 mg total) by mouth 2 (two) times daily with a meal.  .  nitroGLYCERIN (NITROSTAT) 0.4 MG SL tablet Place 1 tablet (0.4 mg total) under the tongue every 5 (five) minutes as needed for chest pain.  . tamsulosin (FLOMAX) 0.4 MG CAPS capsule Take 1 capsule (0.4 mg total) by mouth daily after supper.  . warfarin (COUMADIN) 10 MG tablet Take 1-2 tablets daily as directed by coumadin clinic  . [DISCONTINUED] lisinopril (ZESTRIL) 5 MG tablet Take 1 tablet (5 mg total) by mouth daily.    No Known Allergies  Social History   Tobacco Use  . Smoking status: Current Every Day Smoker    Packs/day: 0.50    Types: Cigarettes  . Smokeless tobacco: Never Used  Substance Use Topics  . Alcohol use: Yes    Alcohol/week: 2.0 standard drinks    Types: 2 Cans of beer per week    Comment: 2 Beers daily.  . Drug use: Yes    Frequency: 3.0 times per week     Types: Marijuana   Social History   Social History Narrative  . Not on file    family history includes Breast cancer in an other family member; Diabetes type II in his mother; Hypertension in his mother; Leukemia in an other family member; Stroke in his brother and mother.  Wt Readings from Last 3 Encounters:  11/29/18 144 lb 6.4 oz (65.5 kg)  11/22/18 141 lb 3.2 oz (64 kg)  11/03/18 131 lb (59.4 kg)    PHYSICAL EXAM BP 120/68   Pulse 78   Temp (!) 97.3 F (36.3 C)   Ht 6' (1.829 m)   Wt 144 lb 6.4 oz (65.5 kg)   BMI 19.58 kg/m  Physical Exam  Constitutional: He is oriented to person, place, and time. No distress.  Still thin, but less gaunt appearing than in the hospital.  Well-groomed.  HENT:  Head: Normocephalic and atraumatic.  Neck: Normal range of motion. Neck supple. No JVD (Difficult to assess because of the mass on his neck.) present. Carotid bruit is not present.  R neck mass - swollen LN/tumor - OLD   Cardiovascular: Normal rate, regular rhythm and S1 normal.  Occasional extrasystoles are present. PMI is displaced (Somewhat laterally displaced and sustained). Exam reveals decreased pulses (Decreased pedal pulses but palpable). Exam reveals no friction rub.  Murmur (Soft 1/6 SEM at RUSB) heard. Cannot determine split S2 versus S4 gallop  Pulmonary/Chest: Effort normal and breath sounds normal. No respiratory distress. He has no wheezes.  Abdominal: Soft. Bowel sounds are normal. He exhibits no distension. There is no abdominal tenderness. There is no rebound.  No HSM  Musculoskeletal: Normal range of motion.        General: No edema.  Neurological: He is alert and oriented to person, place, and time.  Skin: He is not diaphoretic.  Psychiatric: He has a normal mood and affect. His behavior is normal. Judgment and thought content normal.  Seems a little down today  Vitals reviewed.    Adult ECG Report  Rate: 78 ;  Rhythm: normal sinus rhythm and LBBB, LAE.   Otherwise normal axis, intervals and durations.;   Narrative Interpretation: Stable EKG   Other studies Reviewed: Additional studies/ records that were reviewed today include:  Recent Labs:   Lab Results  Component Value Date   CHOL 212 (H) 11/22/2018   HDL 57 11/22/2018   LDLCALC 67 11/04/2018   TRIG 142 11/22/2018   CHOLHDL 3.7 11/22/2018   Lab Results  Component Value Date   CREATININE 0.77 11/22/2018  BUN 15 11/22/2018   NA 136 11/22/2018   K 4.6 11/22/2018   CL 100 11/22/2018   CO2 24 11/22/2018   CBC Latest Ref Rng & Units 11/22/2018 11/12/2018 11/11/2018  WBC 3.4 - 10.8 x10E3/uL 7.4 5.3 5.3  Hemoglobin 13.0 - 17.7 g/dL 10.3(L) 10.7(L) 11.0(L)  Hematocrit 37.5 - 51.0 % 33.3(L) 32.1(L) 32.6(L)  Platelets 150 - 450 x10E3/uL 748(H) 456(H) 469(H)    ASSESSMENT / PLAN: Problem List Items Addressed This Visit    Coronary artery disease involving native coronary artery of native heart with angina pectoris ([place]) (Chronic)    Sizable anterior MI in late December 2019.  Had PCI to the LAD and PTCA of the ostial diagonal branch.  His EF did improve from LV gram EF of 25 to 30% up to about 35 to 40%.  Has not really had further anginal symptoms. Plan:  Was converted from Brilinta to Plavix and aspirin stopped --> would preferably continue Plavix uninterrupted through December (however, if this felt to be somewhat urgent to do prostate biopsy, would be okay to hold Plavix as he is now 9 months out from PCI)  Is on carvedilol at 6.25 mg twice daily.  Converting from lisinopril to [facility]  Not currently on statin --> it does not appear that it was stopped while he was in the hospital.  Will contact him to restart atorvastatin 40 mg      Relevant Medications   sacubitril-valsartan (ENTRESTO) 24-26 MG   Other Relevant Orders   EKG 12-Lead (Completed)   Ambulatory referral to Cardiology   Ischemic cardiomyopathy - Primary (Chronic)    Our plans for evaluation of his EF were  somewhat delayed because of COVID-19.  The plan would have been to recheck an echo in about 3 months that was not done until 6 months late and then before I could see him back he was in the hospital for stroke. He remains euvolemic.  He had had some dizziness so I was reluctant to convert from ACE inhibitor to [facility], however now that his EF has shown on 2 follow-up echo is to be 35 to 40% with anterior hypokinesis, I will have him stop lisinopril and convert to low-dose Entresto.  He is on a lower dose of Coreg which may help.  Is relatively euvolemic and therefore not on diuretic.  I do not think his blood pressure would tolerate spironolactone.  Plan for now is to refer her to electrophysiology to consider CRT-D      Relevant Medications   sacubitril-valsartan (ENTRESTO) 24-26 MG   Other Relevant Orders   EKG 12-Lead (Completed)   Ambulatory referral to Cardiology   Essential hypertension (Chronic)   Relevant Medications   sacubitril-valsartan (ENTRESTO) 24-26 MG   Hyperlipidemia LDL goal <70 (Chronic)    Follow-up lipids look great, but I do not see that he is on atorvastatin anymore.  We will discuss with my nurse to ensure that his atorvastatin has been refilled, but we can use 40 mg.      Relevant Medications   sacubitril-valsartan (ENTRESTO) 24-26 MG   Presence of drug coated stent in LAD coronary artery (Chronic)   Complete left bundle branch block (LBBB) (Chronic)    Question benefit of CRT-D in light of his reduced EF and large anterior wall motion normality.  I suspect some of the septal wall dyskinesis could be related to the bundle branch block.  Will refer to EP to consider CRT-D      Relevant  Medications   sacubitril-valsartan (ENTRESTO) 24-26 MG   Other Relevant Orders   EKG 12-Lead (Completed)   Ambulatory referral to Cardiology   Aortic mural thrombus (HCC) (Chronic)    Diagnosed in the setting of TIA/CVA.  Recommendations are to treat similar to LV thrombus.  Plan for now is to continue warfarin for minimum of 43months.  As such we have stopped Brilinta and aspirin --> converted to Plavix alone.  For now, the plan again would be uninterrupted anticoagulation for 6 months.  If he does need to have prostate biopsy done in the interim, I have discussed with our clinical pharmacist team the plan would be to bridge him likely with Lovenox for any biopsies.  This would allow [place] some coverage being off of Plavix and warfarin for procedures. --> If biopsy is going to be performed, the recommendation will be to discuss with [facility] office Clinical Pharmacist      Relevant Medications   sacubitril-valsartan (ENTRESTO) 24-26 MG   History of TIA (transient ischemic attack)      I spent a total of 30 minutes with the patient and chart review. >  50% of the time was spent in direct patient consultation.   Current medicines are reviewed at length with the patient today.  (+/- concerns) n/a  Patient Instructions  Medication Instructions: -STOP LISINOPRIL AFTER 2 DAYS  -START ENTRESTO 24/26 MG ONE TABLET TWICE A DAY   - DECREASE CARVEDILOL TO HALF A TABLET TWICE A DAY  UNTIL YOUR APPOINTMENT WITH CV[facility]   Lab work:  NOT NEEDED  Testing/Procedures:  NOT NEEDED  Follow-Up: At [facility], you and your health needs are our priority.  As part of our continuing mission to provide you with exceptional heart care, we have created designated Provider Care Teams.  These Care Teams include your primary Cardiologist (physician) and Advanced Practice Providers (APPs -  Physician Assistants and Nurse Practitioners) who all work together to provide you with the care you need, when you need it. . You will need a follow up appointment in [place].  Please call our office 2 months in advance to schedule this appointment.  You may see Glenetta Hew, MD or one of the following Advanced Practice Providers on your designated Care Team:   .  Rosaria Ferries, PA-C . Jory Sims, DNP, ANP- Your physician recommends that you schedule a follow-up appointment in [place] physician recommends that you schedule a follow-up appointment in 2 WEEKS - BLOOD PRESSURE AND INR- WITH [facility]   You have been referred to ELECTROPHYSIOLOGIST-  POSSIBLE ICD OR CRT    Any Other Special Instructions Will Be Listed Below (If Applicable).    Studies Ordered:   Orders Placed This Encounter  Procedures  . Ambulatory referral to Cardiology  . EKG 12-Lead      Glenetta Hew, M.D., M.S. Interventional Cardiologist   Pager # [phone] Phone # [phone] [address] [place], [place] [address]   Thank you for choosing Heartcare at [facility]!!

## 2018-12-02 ENCOUNTER — Encounter: Payer: Self-pay | Admitting: Cardiology

## 2018-12-02 NOTE — Assessment & Plan Note (Signed)
Follow-up lipids look great, but I do not see that he is on atorvastatin anymore.  We will discuss with my nurse to ensure that his atorvastatin has been refilled, but we can use 40 mg.

## 2018-12-02 NOTE — Assessment & Plan Note (Addendum)
Question benefit of CRT-D in light of his reduced EF and large anterior wall motion normality.  I suspect some of the septal wall dyskinesis could be related to the bundle branch block.  Will refer to EP to consider CRT-D

## 2018-12-02 NOTE — Assessment & Plan Note (Signed)
Our plans for evaluation of his EF were somewhat delayed because of COVID-19.  The plan would have been to recheck an echo in about 3 months that was not done until 6 months late and then before I could see him back he was in the hospital for stroke. He remains euvolemic.  He had had some dizziness so I was reluctant to convert from ACE inhibitor to [facility], however now that his EF has shown on 2 follow-up echo is to be 35 to 40% with anterior hypokinesis, I will have him stop lisinopril and convert to low-dose Entresto.  He is on a lower dose of Coreg which may help.  Is relatively euvolemic and therefore not on diuretic.  I do not think his blood pressure would tolerate spironolactone.  Plan for now is to refer her to electrophysiology to consider CRT-D

## 2018-12-02 NOTE — Assessment & Plan Note (Signed)
Sizable anterior MI in late December 2019.  Had PCI to the LAD and PTCA of the ostial diagonal branch.  His EF did improve from LV gram EF of 25 to 30% up to about 35 to 40%.  Has not really had further anginal symptoms. Plan:  Was converted from Brilinta to Plavix and aspirin stopped --> would preferably continue Plavix uninterrupted through December (however, if this felt to be somewhat urgent to do prostate biopsy, would be okay to hold Plavix as he is now 9 months out from PCI)  Is on carvedilol at 6.25 mg twice daily.  Converting from lisinopril to [facility]  Not currently on statin --> it does not appear that it was stopped while he was in the hospital.  Will contact him to restart atorvastatin 40 mg

## 2018-12-02 NOTE — Assessment & Plan Note (Signed)
Diagnosed in the setting of TIA/CVA.  Recommendations are to treat similar to LV thrombus. Plan for now is to continue warfarin for minimum of 92months.  As such we have stopped Brilinta and aspirin --> converted to Plavix alone.  For now, the plan again would be uninterrupted anticoagulation for 6 months.  If Daniel Weiss does need to have prostate biopsy done in the interim, I have discussed with our clinical pharmacist team the plan would be to bridge him likely with Lovenox for any biopsies.  This would allow [place] some coverage being off of Plavix and warfarin for procedures. --> If biopsy is going to be performed, the recommendation will be to discuss with [facility] Clinical Pharmacist

## 2018-12-06 ENCOUNTER — Other Ambulatory Visit: Payer: Self-pay

## 2018-12-06 ENCOUNTER — Ambulatory Visit (INDEPENDENT_AMBULATORY_CARE_PROVIDER_SITE_OTHER): Payer: Medicaid Other | Admitting: Pharmacist Clinician (PhC)/ Clinical Pharmacy Specialist

## 2018-12-06 DIAGNOSIS — I639 Cerebral infarction, unspecified: Secondary | ICD-10-CM | POA: Diagnosis not present

## 2018-12-06 DIAGNOSIS — Z7901 Long term (current) use of anticoagulants: Secondary | ICD-10-CM

## 2018-12-06 DIAGNOSIS — Z8673 Personal history of transient ischemic attack (TIA), and cerebral infarction without residual deficits: Secondary | ICD-10-CM | POA: Diagnosis not present

## 2018-12-06 DIAGNOSIS — I7410 Embolism and thrombosis of unspecified parts of aorta: Secondary | ICD-10-CM

## 2018-12-06 LAB — POCT INR: INR: 3 (ref 2.0–3.0)

## 2018-12-08 ENCOUNTER — Other Ambulatory Visit: Payer: Self-pay | Admitting: *Deleted

## 2018-12-08 NOTE — Telephone Encounter (Signed)
Patient left a msg on the refill vm requesting a refill on tamsulosin.

## 2018-12-15 ENCOUNTER — Other Ambulatory Visit: Payer: Self-pay

## 2018-12-15 ENCOUNTER — Ambulatory Visit (INDEPENDENT_AMBULATORY_CARE_PROVIDER_SITE_OTHER): Payer: Medicaid Other | Admitting: Pharmacist Clinician (PhC)/ Clinical Pharmacy Specialist

## 2018-12-15 ENCOUNTER — Ambulatory Visit: Payer: Medicaid Other | Admitting: Pharmacist Clinician (PhC)/ Clinical Pharmacy Specialist

## 2018-12-15 DIAGNOSIS — Z7901 Long term (current) use of anticoagulants: Secondary | ICD-10-CM | POA: Diagnosis not present

## 2018-12-15 DIAGNOSIS — I255 Ischemic cardiomyopathy: Secondary | ICD-10-CM

## 2018-12-15 DIAGNOSIS — Z8673 Personal history of transient ischemic attack (TIA), and cerebral infarction without residual deficits: Secondary | ICD-10-CM | POA: Diagnosis not present

## 2018-12-15 LAB — POCT INR: INR: 3.2 — AB (ref 2.0–3.0)

## 2018-12-15 MED ORDER — CARVEDILOL 3.125 MG PO TABS: 3.1250 mg | ORAL_TABLET | Freq: Two times a day (BID) | ORAL | 5 refills | Status: DC

## 2018-12-15 NOTE — Progress Notes (Signed)
12/15/2018 Daniel Weiss 11-20-1960 [id number]   HPI:  Daniel Weiss is a 58 y.o. male patient of Dr Ellyn Hack, with a PMH below who presents today for heart failure medication titration.  His pertinent medical history is listed below.  Patient has ischemic cardiomyopathy, with his last ECHO showing an EF of 35-40%.  At his last visit with Dr. Ellyn Hack on Sept 21 he was taken off lisinopril and after a 36 hour wash out, was started on Entresto 24/26 mg bid.  He was not on a diuretic, and his carvedilol is at 6.25 mg bid.    Today he reports feeling well overall.  No dizziness or lightheadedness, no CP, SOB, or LEE.  He does complain about ongoing pain/numbness in his right arm.  He notes that when the stroke occurred his left arm became "useless".  It is doing well now, however in the last few days his right arm is numb from the elbow down and has shooting pains.  This mostly happens at night.    Past Medical History: CVA/TIA Frontoparietal infarcts, embolic [id number]  NSTEMI [id number] - DES to proximal LAD  Hypertension Currently on carvedilol and Entresto  hyperlipidemia 9/20:  TC 212, TG 142, HDL 57, LDL 130  Tobacco abuse 1 pack lasts 10-14 days per patient     Blood Pressure Goal:  130/80  Current Medications:  Family Hx: mother died in her 46's, father made it to early 33's.  Not sure of any health reasons Siblings and children all heathy  Social Hx: smokes occasionally, pack lasts 1.5-2 weeks, drinks 1 beer 2-3 times per week, no caffeine  Diet:  States eating healthy, getting mix of vegetables; mostly chicken, [place], fish for meats; snacks on veggies with Ranch dressing  Exercise: walks and stays active at home  Home BP readings: home cuff - states systolic has been < 123XX123 since starting the Entresto  Intolerances: NKDA  Labs: 9/20:  Na 136, K 4.6, Glu 88, BUN 15, SCr 0.77  Wt Readings from Last 3 Encounters:  12/15/18 144 lb 12.8 oz (65.7 kg)  11/29/18 144 lb 6.4 oz (65.5  kg)  11/22/18 141 lb 3.2 oz (64 kg)   BP Readings from Last 3 Encounters:  12/15/18 (!) 84/62  11/29/18 120/68  11/22/18 122/84   Pulse Readings from Last 3 Encounters:  12/15/18 78  11/29/18 78  11/22/18 74    Current Outpatient Medications  Medication Sig Dispense Refill  . nitroGLYCERIN (NITROSTAT) 0.4 MG SL tablet Place 1 tablet (0.4 mg total) under the tongue every 5 (five) minutes as needed for chest pain. 25 tablet 3  . sacubitril-valsartan (ENTRESTO) 24-26 MG Take 1 tablet by mouth 2 (two) times daily. 60 tablet 4  . tamsulosin (FLOMAX) 0.4 MG CAPS capsule Take 1 capsule (0.4 mg total) by mouth daily after supper. 30 capsule 0  . warfarin (COUMADIN) 10 MG tablet Take 1-2 tablets daily as directed by coumadin clinic 60 tablet 0  . carvedilol (COREG) 3.125 MG tablet Take 1 tablet (3.125 mg total) by mouth 2 (two) times daily. 60 tablet 5   No current facility-administered medications for this visit.     No Known Allergies  Past Medical History:  Diagnosis Date  . Abnormal chest CT 02/2018   a. Ectatic 3 cm Ao arch - rec f/u outpt imaging w/ CTA or MRA. Ectatic atheromatous abd Ao @ risk for aneurysm - rec f/u u/s in 5 yrs. Marked prostatic enlargement w/ scattered small sclerotic  foci in the thoracic lower lumbar spine, sacrum, left 12th rib, pelvis, and right femur.  Recommend elective outpatient whole-body bone scintigraphy and PSA.  Marland Kitchen CAD (coronary artery disease)    a. 02/2018 ACS/PCI: LM nl, LAD 85p (3.5x15 Sierra DES), D1 45ost, RI 55ost, RCA nl, EF 25-35%.  . Complete left bundle branch block (LBBB) 2016  . Current every day smoker   . Elevated PSA 11/2018  . Enlarged prostate   . HFrEF (heart failure with reduced ejection fraction) ([place])   . Hypertension   . Ischemic cardiomyopathy 02/2018   a. 02/2018 Echo: EF 35-40% (LV gram on cath was 25-30%), mod, diff HK. mid-apicalanteroseptal, ant, and apical sev HK. RVH.-->  No notable change on echo from June 2020 and  August 2020.  Marland Kitchen Myocardial infarction ([place])   . NSTEMI (non-ST elevated myocardial infarction) ([place]) 02/2018  . Stroke ([place])   . TIA (transient ischemic attack) 2016  . Warthin's tumor     Blood pressure (!) 84/62, pulse 78, resp. rate 15, height 6' (1.829 m), weight 144 lb 12.8 oz (65.7 kg), SpO2 95 %.  Ischemic cardiomyopathy Patient with HFrEF now on Entresto 24/26 mg twice daily.  Because of his low blood pressure, will cut the dose of carvedilol to 3.125 mg twice daily.  Hopefully this will raise the BP enough to tolerate Entresto.  Reviewed information with Dr. Ellyn Hack regarding the numbness in his arm.  He suspects that this could be due to the low blood pressure.   Patient has been instructed to hold both medications tonight and Entresto in the morning.  He will continue with home BP checks twice daily, and if the systolic is < 90, he is to hold the next dose of Entresto.  He will return in 2 weeks for follow up.     Daniel Weiss PharmD CPP [facility] [address] [place], [place] [address] [phone]

## 2018-12-15 NOTE — Patient Instructions (Addendum)
Return for a a follow up appointment in 2 weeks  Call if you develop dizziness or lightheadedness.  If your top blood pressure readings is < 90, please skip your next dose of Entresto.  Please indicate on your BP log which doses are not taken  Your blood pressure today is 84/62  Check your blood pressure at home daily and keep record of the readings.  Take your meds as follows:  Skip Entresto and carvedilol tonight, as well as tomorrow morning dose of Entresto  Continue with Entresto 24/26 mg twice daily - restart Thursday evening  Start carvedilol 3.125 mg twice daily - tomorrow morning -  (put the 12.5 mg tabs in your cupboard for now)  Continue with all other medications  Bring all of your meds, your BP cuff and your record of home blood pressures to your next appointment.  Exercise as you're able, try to walk approximately 30 minutes per day.  Keep salt intake to a minimum, especially watch canned and prepared boxed foods.  Eat more fresh fruits and vegetables and fewer canned items.  Avoid eating in fast food restaurants.    HOW TO TAKE YOUR BLOOD PRESSURE: . Rest 5 minutes before taking your blood pressure. .  Don't smoke or drink caffeinated beverages for at least 30 minutes before. . Take your blood pressure before (not after) you eat. . Sit comfortably with your back supported and both feet on the floor (don't cross your legs). . Elevate your arm to heart level on a table or a desk. . Use the proper sized cuff. It should fit smoothly and snugly around your bare upper arm. There should be enough room to slip a fingertip under the cuff. The bottom edge of the cuff should be 1 inch above the crease of the elbow. . Ideally, take 3 measurements at one sitting and record the average.

## 2018-12-15 NOTE — Assessment & Plan Note (Addendum)
Patient with HFrEF now on Entresto 24/26 mg twice daily.  Because of his low blood pressure, will cut the dose of carvedilol to 3.125 mg twice daily.  Hopefully this will raise the BP enough to tolerate Entresto.  Reviewed information with Dr. Ellyn Hack regarding the numbness in his arm.  He suspects that this could be due to the low blood pressure.   Patient has been instructed to hold both medications tonight and Entresto in the morning.  He will continue with home BP checks twice daily, and if the systolic is < 90, he is to hold the next dose of Entresto.  He will return in 2 weeks for follow up.

## 2018-12-16 ENCOUNTER — Other Ambulatory Visit: Payer: Self-pay

## 2018-12-16 MED ORDER — TAMSULOSIN HCL 0.4 MG PO CAPS: 0.4000 mg | ORAL_CAPSULE | Freq: Every day | ORAL | 0 refills | Status: DC

## 2018-12-21 ENCOUNTER — Institutional Professional Consult (permissible substitution): Payer: Medicaid Other | Admitting: Cardiology

## 2018-12-21 NOTE — Progress Notes (Deleted)
Electrophysiology Office Note   Date:  12/21/2018   ID:  Daniel Weiss, DOB 26-Feb-1961, MRN [id number]  PCP:  Azzie Glatter, FNP  Cardiologist:  *** Primary Electrophysiologist: ***  Meredith Leeds, MD    Chief Complaint: ***   History of Present Illness: Daniel Weiss is a 58 y.o. male who is being seen today for the evaluation of *** at the request of [name], MD. Presenting today for electrophysiology evaluation.    Today, he denies*** symptoms of palpitations, chest pain, shortness of breath, orthopnea, PND, lower extremity edema, claudication, dizziness, presyncope, syncope, bleeding, or neurologic sequela. The patient is tolerating medications without difficulties.    Past Medical History:  Diagnosis Date  . Abnormal chest CT 02/2018   a. Ectatic 3 cm Ao arch - rec f/u outpt imaging w/ CTA or MRA. Ectatic atheromatous abd Ao @ risk for aneurysm - rec f/u u/s in 5 yrs. Marked prostatic enlargement w/ scattered small sclerotic foci in the thoracic lower lumbar spine, sacrum, left 12th rib, pelvis, and right femur.  Recommend elective outpatient whole-body bone scintigraphy and PSA.  Marland Kitchen CAD (coronary artery disease)    a. 02/2018 ACS/PCI: LM nl, LAD 85p (3.5x15 Sierra DES), D1 45ost, RI 55ost, RCA nl, EF 25-35%.  . Complete left bundle branch block (LBBB) 2016  . Current every day smoker   . Elevated PSA 11/2018  . Enlarged prostate   . HFrEF (heart failure with reduced ejection fraction) ([place])   . Hypertension   . Ischemic cardiomyopathy 02/2018   a. 02/2018 Echo: EF 35-40% (LV gram on cath was 25-30%), mod, diff HK. mid-apicalanteroseptal, ant, and apical sev HK. RVH.-->  No notable change on echo from June 2020 and August 2020.  Marland Kitchen Myocardial infarction ([place])   . NSTEMI (non-ST elevated myocardial infarction) ([place]) 02/2018  . Stroke ([place])   . TIA (transient ischemic attack) 2016  . Warthin's tumor    Past Surgical History:  Procedure Laterality Date   . CORONARY STENT INTERVENTION N/A 03/06/2018   Procedure: CORONARY STENT INTERVENTION;  Surgeon: [name], MD;  Location: [facility];  Service: Cardiovascular: Proximal LAD 85% stenosis (and D1): DES PCI with Xience Sierra DES 3.5 mm x 15 mm - 3.9 mm  . INTRAOPERATIVE TRANSTHORACIC ECHOCARDIOGRAM  03/06/2018   (Peri-MI) mild reduced EF 35-40%.  Diffuse hypokinesis (severe HK of the mid-apical anteroseptal, anterior and apical myocardium consistent with LAD infarct).  Unable to assess diastolic function.  Paradoxical septal motion likely related to his LBBB.  RV hypertrophy noted.  Trivial pericardial effusion noted.   Marland Kitchen LEFT HEART CATH AND CORONARY ANGIOGRAPHY N/A 03/06/2018   Procedure: LEFT HEART CATH AND CORONARY ANGIOGRAPHY;  Surgeon: [name], MD;  Location: [facility];  Service: Cardiovascular: Proximal LAD 85% involving D1 45%.  Ostial RI 55%.  Severe LV dysfunction EF 25 to 30%.  1+ MR.  Only minimally elevated LVEDP.  Marland Kitchen TRANSTHORACIC ECHOCARDIOGRAM  10/2018   (Most recent echo, to evaluate for TIA/CVA) : Moderate reduced EF 35 to 40%.  Moderate concentric LVH.  Mild dyskinesis of the apex and severe hypokinesis of mid apical anteroseptal and anterior wall.  Unable to assess RV pressures.  Aortic sclerosis with no stenosis.  No significant change seen     Current Outpatient Medications  Medication Sig Dispense Refill  . carvedilol (COREG) 3.125 MG tablet Take 1 tablet (3.125 mg total) by mouth 2 (two) times daily. 60 tablet 5  .  nitroGLYCERIN (NITROSTAT) 0.4 MG SL tablet Place 1 tablet (0.4 mg total) under the tongue every 5 (five) minutes as needed for chest pain. 25 tablet 3  . sacubitril-valsartan (ENTRESTO) 24-26 MG Take 1 tablet by mouth 2 (two) times daily. 60 tablet 4  . tamsulosin (FLOMAX) 0.4 MG CAPS capsule Take 1 capsule (0.4 mg total) by mouth daily after supper. 30 capsule 0  . warfarin (COUMADIN) 10 MG tablet Take 1-2 tablets daily as directed  by coumadin clinic 60 tablet 0   No current facility-administered medications for this visit.     Allergies:   Patient has no known allergies.   Social History:  The patient  reports that he has been smoking cigarettes. He has been smoking about 0.50 packs per day. He has never used smokeless tobacco. He reports current alcohol use of about 2.0 standard drinks of alcohol per week. He reports current drug use. Frequency: 3.00 times per week. Drug: Marijuana.   Family History:  The patient's ***family history includes Breast cancer in an other family member; Diabetes type II in his mother; Hypertension in his mother; Leukemia in an other family member; Stroke in his brother and mother.    ROS:  Please see the history of present illness.   Otherwise, review of systems is positive for ***.   All other systems are reviewed and negative.    PHYSICAL EXAM: VS:  There were no vitals taken for this visit. , BMI There is no height or weight on file to calculate BMI. GEN: Well nourished, well developed, in no acute distress  HEENT: normal  Neck: no JVD, carotid bruits, or masses Cardiac: ***RRR; no murmurs, rubs, or gallops,no edema  Respiratory:  clear to auscultation bilaterally, normal work of breathing GI: soft, nontender, nondistended, + BS MS: no deformity or atrophy  Skin: warm and dry, ***device pocket is well healed Neuro:  Strength and sensation are intact Psych: euthymic mood, full affect  EKG:  EKG {ACTION; IS/IS [id number] ordered today. Personal review of the ekg ordered *** shows ***  ***Device interrogation is reviewed today in detail.  See PaceArt for details.   Recent Labs: 11/11/2018: Magnesium 2.0 11/22/2018: ALT 11; BUN 15; Creatinine, Ser 0.77; Hemoglobin 10.3; Platelets 748; Potassium 4.6; Sodium 136; TSH 1.950    Lipid Panel     Component Value Date/Time   CHOL 212 (H) 11/22/2018 1345   TRIG 142 11/22/2018 1345   HDL 57 11/22/2018 1345   CHOLHDL 3.7  11/22/2018 1345   CHOLHDL 3.2 11/04/2018 0354   VLDL 14 11/04/2018 0354   LDLCALC 130 (H) 11/22/2018 1345     Wt Readings from Last 3 Encounters:  12/15/18 144 lb 12.8 oz (65.7 kg)  11/29/18 144 lb 6.4 oz (65.5 kg)  11/22/18 141 lb 3.2 oz (64 kg)      Other studies Reviewed: Additional studies/ records that were reviewed today include: TTE 11/04/18  Review of the above records today demonstrates:   1. Mild dyskinesis of the left ventricular, entire apical segment.  2. Severe hypokinesis of the left ventricular, mid-apical anteroseptal wall and anterior wall.  3. The left ventricle has moderately reduced systolic function, with an ejection fraction of 35-40%. The cavity size was normal. There is moderate concentric left ventricular hypertrophy. Left ventricular diastolic Doppler parameters are consistent with  impaired relaxation. There is abnormal septal motion consistent with left bundle branch block. No evidence of left ventricular regional wall motion abnormalities.  4. The right ventricle has normal systolic  function. The cavity was normal. There is no increase in right ventricular wall thickness. Right ventricular systolic pressure could not be assessed.  5. Sclerosis without any evidence of stenosis of the aortic valve. Aortic valve regurgitation is trivial by color flow Doppler.  6. The aorta is normal unless otherwise noted.  7. The aortic root is normal in size and structure.  8. When compared to the prior study: 08/16/2018, no significant change is seen.  LHC 03/07/19  There is moderate to severe left ventricular systolic dysfunction.  LV end diastolic pressure is mildly elevated.  The left ventricular ejection fraction is 25-35% by visual estimate.  There is trivial (1+) mitral regurgitation.  Prox LAD lesion is 85% stenosed with 45% stenosed side branch in Ost 1st Diag.  Post intervention, there is a 0% residual stenosis.  Post intervention, the side branch was  reduced to 45% residual stenosis.  A drug-eluting stent was successfully placed using a STENT SIERRA 3.50 X 15 MM.  Ost Ramus lesion is 55% stenosed.  ASSESSMENT AND PLAN:  1.  ***    Current medicines are reviewed at length with the patient today.   The patient {ACTIONS; HAS/DOES NOT HAVE:19233} concerns regarding his medicines.  The following changes were made today:  {NONE DEFAULTED:18576::"none"}  Labs/ tests ordered today include: *** No orders of the defined types were placed in this encounter.    Disposition:   FU with   {gen number [id number] {Days to years:10300}  Signed,  Meredith Leeds, MD  12/21/2018 8:36 AM     [facility] [address] [place] [address] [phone] (office) [phone] (fax)

## 2018-12-24 ENCOUNTER — Other Ambulatory Visit: Payer: Self-pay | Admitting: *Deleted

## 2018-12-24 DIAGNOSIS — I712 Thoracic aortic aneurysm, without rupture, unspecified: Secondary | ICD-10-CM

## 2018-12-30 ENCOUNTER — Ambulatory Visit (INDEPENDENT_AMBULATORY_CARE_PROVIDER_SITE_OTHER): Payer: Medicaid Other | Admitting: Pharmacist

## 2018-12-30 ENCOUNTER — Telehealth: Payer: Self-pay | Admitting: Cardiology

## 2018-12-30 ENCOUNTER — Other Ambulatory Visit: Payer: Self-pay

## 2018-12-30 ENCOUNTER — Ambulatory Visit: Payer: Medicaid Other

## 2018-12-30 VITALS — BP 112/78 | HR 74 | Wt 145.4 lb

## 2018-12-30 DIAGNOSIS — Z8673 Personal history of transient ischemic attack (TIA), and cerebral infarction without residual deficits: Secondary | ICD-10-CM

## 2018-12-30 DIAGNOSIS — I255 Ischemic cardiomyopathy: Secondary | ICD-10-CM | POA: Diagnosis not present

## 2018-12-30 DIAGNOSIS — Z7901 Long term (current) use of anticoagulants: Secondary | ICD-10-CM | POA: Diagnosis not present

## 2018-12-30 LAB — POCT INR: INR: 8 — AB (ref 2.0–3.0)

## 2018-12-30 LAB — PROTIME-INR
INR: 8.1 (ref 0.9–1.2)
Prothrombin Time: 78.8 s — ABNORMAL HIGH (ref 9.1–12.0)

## 2018-12-30 MED ORDER — ENTRESTO 24-26 MG PO TABS: 1.0000 | ORAL_TABLET | Freq: Two times a day (BID) | ORAL | 4 refills | Status: DC

## 2018-12-30 NOTE — Telephone Encounter (Signed)
Called with critical lab, patient INR 8.1 on draw this afternoon. I attempted to call patient twice but was unable to reach. LVM asking not to take coumadin until instructed by the office. Appears he had a INR check in the office today. Has repeat appt on 10/27 for INR recheck. Instructed to call back if questions.

## 2018-12-30 NOTE — Patient Instructions (Signed)
Return for a  follow up appointment in as needed  Check your blood pressure at home daily (if able) and keep record of the readings.  Take your BP meds as follows: *CONTINUE ENTRESTO 24/26mg  twice daily* *CONTINUE taking carvedilol 3.125mg  twice daily*  Bring all of your meds, your BP cuff and your record of home blood pressures to your next appointment.  Exercise as you're able, try to walk approximately 30 minutes per day.  Keep salt intake to a minimum, especially watch canned and prepared boxed foods.  Eat more fresh fruits and vegetables and fewer canned items.  Avoid eating in fast food restaurants.    HOW TO TAKE YOUR BLOOD PRESSURE: . Rest 5 minutes before taking your blood pressure. .  Don't smoke or drink caffeinated beverages for at least 30 minutes before. . Take your blood pressure before (not after) you eat. . Sit comfortably with your back supported and both feet on the floor (don't cross your legs). . Elevate your arm to heart level on a table or a desk. . Use the proper sized cuff. It should fit smoothly and snugly around your bare upper arm. There should be enough room to slip a fingertip under the cuff. The bottom edge of the cuff should be 1 inch above the crease of the elbow. . Ideally, take 3 measurements at one sitting and record the average.

## 2018-12-30 NOTE — Progress Notes (Signed)
HPI:  Daniel Weiss is a 58 y.o. male patient of Dr Ellyn Hack, with a PMH below who presents today for heart failure medication titration.  His pertinent medical history is listed below.  Patient has ischemic cardiomyopathy, with his last ECHO showing an EF of 35-40%.  Patient denies dizziness, shortness of breath or increased fatigue.   Past Medical History: CVA/TIA Frontoparietal infarcts, embolic [id number]  NSTEMI Q000111Q - DES to proximal LAD  Hypertension Currently on carvedilol and Entresto  hyperlipidemia 9/20:  TC 212, TG 142, HDL 57, LDL 130  Tobacco abuse 1 pack lasts 10-14 days per patient     Blood Pressure Goal:  130/80 or less  Current Medications: Entresto 24/26mg  twice daily Cravedilol 3.125mg  twice dsaily (decreased on last OV)  Family Hx: mother died in her 31's, father made it to early 32's.  Not sure of any health reasons Siblings and children all heathy  Social Hx: smokes occasionally, pack lasts 1.5-2 weeks, drinks 1 beer 2-3 times per week, no caffeine  Diet:  States eating healthy, getting mix of vegetables; mostly chicken, [place], fish for meats; snacks on veggies with Ranch dressing  Exercise: walks and stays active at home  Home BP readings: none available  Labs: 9/20:  Na 136, K 4.6, Glu 88, BUN 15, SCr 0.77  Wt Readings from Last 3 Encounters:  12/30/18 145 lb 6.4 oz (66 kg)  12/15/18 144 lb 12.8 oz (65.7 kg)  11/29/18 144 lb 6.4 oz (65.5 kg)   BP Readings from Last 3 Encounters:  12/30/18 112/78  12/15/18 (!) 84/62  11/29/18 120/68   Pulse Readings from Last 3 Encounters:  12/30/18 74  12/15/18 78  11/29/18 78    Current Outpatient Medications  Medication Sig Dispense Refill  . carvedilol (COREG) 3.125 MG tablet Take 1 tablet (3.125 mg total) by mouth 2 (two) times daily. 60 tablet 5  . nitroGLYCERIN (NITROSTAT) 0.4 MG SL tablet Place 1 tablet (0.4 mg total) under the tongue every 5 (five) minutes as needed for chest pain. 25 tablet 3   . sacubitril-valsartan (ENTRESTO) 24-26 MG Take 1 tablet by mouth 2 (two) times daily. 60 tablet 8  . tamsulosin (FLOMAX) 0.4 MG CAPS capsule Take 1 capsule (0.4 mg total) by mouth daily after supper. 30 capsule 0  . warfarin (COUMADIN) 10 MG tablet Take 1-2 tablets daily as directed by coumadin clinic 60 tablet 0   No current facility-administered medications for this visit.     No Known Allergies  Past Medical History:  Diagnosis Date  . Abnormal chest CT 02/2018   a. Ectatic 3 cm Ao arch - rec f/u outpt imaging w/ CTA or MRA. Ectatic atheromatous abd Ao @ risk for aneurysm - rec f/u u/s in 5 yrs. Marked prostatic enlargement w/ scattered small sclerotic foci in the thoracic lower lumbar spine, sacrum, left 12th rib, pelvis, and right femur.  Recommend elective outpatient whole-body bone scintigraphy and PSA.  Marland Kitchen CAD (coronary artery disease)    a. 02/2018 ACS/PCI: LM nl, LAD 85p (3.5x15 Sierra DES), D1 45ost, RI 55ost, RCA nl, EF 25-35%.  . Complete left bundle branch block (LBBB) 2016  . Current every day smoker   . Elevated PSA 11/2018  . Enlarged prostate   . HFrEF (heart failure with reduced ejection fraction) ([place])   . Hypertension   . Ischemic cardiomyopathy 02/2018   a. 02/2018 Echo: EF 35-40% (LV gram on cath was 25-30%), mod, diff HK. mid-apicalanteroseptal, ant, and apical  sev HK. RVH.-->  No notable change on echo from June 2020 and August 2020.  Marland Kitchen Myocardial infarction ([place])   . NSTEMI (non-ST elevated myocardial infarction) ([place]) 02/2018  . Stroke ([place])   . TIA (transient ischemic attack) 2016  . Warthin's tumor     Blood pressure 112/78, pulse 74, weight 145 lb 6.4 oz (66 kg), SpO2 96 %.  Ischemic cardiomyopathy Blood pressure ands HR significantly improved since carvedilol dose reduction 2 weeks ago. Patient denies dizziness, swelling, SOB, or increased fatigue.  Will continue all medication without changes and follow up as needed.    [name]  PharmD, BCPS, [place] [address] 01/04/2019 8:45 AM

## 2019-01-03 ENCOUNTER — Other Ambulatory Visit: Payer: Self-pay

## 2019-01-03 MED ORDER — ENTRESTO 24-26 MG PO TABS: 1.0000 | ORAL_TABLET | Freq: Two times a day (BID) | ORAL | 8 refills | Status: DC

## 2019-01-04 ENCOUNTER — Ambulatory Visit (INDEPENDENT_AMBULATORY_CARE_PROVIDER_SITE_OTHER): Payer: Medicaid Other | Admitting: Pharmacist

## 2019-01-04 ENCOUNTER — Encounter: Payer: Self-pay | Admitting: Pharmacist

## 2019-01-04 ENCOUNTER — Other Ambulatory Visit: Payer: Self-pay

## 2019-01-04 DIAGNOSIS — Z7901 Long term (current) use of anticoagulants: Secondary | ICD-10-CM

## 2019-01-04 DIAGNOSIS — Z8673 Personal history of transient ischemic attack (TIA), and cerebral infarction without residual deficits: Secondary | ICD-10-CM | POA: Diagnosis not present

## 2019-01-04 LAB — POCT INR: INR: 1.2 — AB (ref 2.0–3.0)

## 2019-01-04 MED ORDER — WARFARIN SODIUM 10 MG PO TABS: ORAL_TABLET | ORAL | 0 refills | Status: DC

## 2019-01-04 NOTE — Assessment & Plan Note (Addendum)
Blood pressure ands HR significantly improved since carvedilol dose reduction 2 weeks ago. Patient denies dizziness, swelling, SOB, or increased fatigue.  Will continue all medication without changes and follow up as needed.

## 2019-01-07 ENCOUNTER — Telehealth: Payer: Self-pay | Admitting: *Deleted

## 2019-01-07 NOTE — Telephone Encounter (Signed)
Prior authorization for Entresto 24/26 mg    Completed to [employer]   Medication approved  - confirmation# [id number] W  medication approved  notifed  Pharmacy   [facility]  [address] [place]  patiewnt aware

## 2019-01-13 ENCOUNTER — Other Ambulatory Visit: Payer: Self-pay | Admitting: Cardiology

## 2019-01-18 ENCOUNTER — Ambulatory Visit (INDEPENDENT_AMBULATORY_CARE_PROVIDER_SITE_OTHER): Payer: Medicaid Other | Admitting: Pharmacist

## 2019-01-18 ENCOUNTER — Other Ambulatory Visit: Payer: Self-pay

## 2019-01-18 DIAGNOSIS — Z7901 Long term (current) use of anticoagulants: Secondary | ICD-10-CM

## 2019-01-18 DIAGNOSIS — Z8673 Personal history of transient ischemic attack (TIA), and cerebral infarction without residual deficits: Secondary | ICD-10-CM | POA: Diagnosis not present

## 2019-01-18 DIAGNOSIS — I639 Cerebral infarction, unspecified: Secondary | ICD-10-CM | POA: Diagnosis not present

## 2019-01-18 LAB — POCT INR: INR: 2.4 (ref 2.0–3.0)

## 2019-01-18 MED ORDER — TAMSULOSIN HCL 0.4 MG PO CAPS: 0.4000 mg | ORAL_CAPSULE | Freq: Every day | ORAL | 1 refills | Status: DC

## 2019-01-31 ENCOUNTER — Ambulatory Visit
Admission: RE | Admit: 2019-01-31 | Discharge: 2019-01-31 | Disposition: A | Discharge status: Home / Self Care | Payer: Medicaid Other | Source: Ambulatory Visit | Attending: Thoracic Surgery (Cardiothoracic Vascular Surgery) | Admitting: Thoracic Surgery (Cardiothoracic Vascular Surgery)

## 2019-01-31 ENCOUNTER — Other Ambulatory Visit: Payer: Self-pay

## 2019-01-31 ENCOUNTER — Ambulatory Visit (INDEPENDENT_AMBULATORY_CARE_PROVIDER_SITE_OTHER): Payer: Medicaid Other | Admitting: Cardiology

## 2019-01-31 ENCOUNTER — Encounter: Payer: Self-pay | Admitting: Cardiology

## 2019-01-31 ENCOUNTER — Encounter: Payer: Self-pay | Admitting: Thoracic Surgery (Cardiothoracic Vascular Surgery)

## 2019-01-31 ENCOUNTER — Ambulatory Visit (INDEPENDENT_AMBULATORY_CARE_PROVIDER_SITE_OTHER): Payer: Medicaid Other | Admitting: Thoracic Surgery (Cardiothoracic Vascular Surgery)

## 2019-01-31 VITALS — BP 120/80 | HR 71 | Temp 96.9°F | Ht 72.0 in | Wt 149.0 lb

## 2019-01-31 VITALS — BP 132/86 | HR 78 | Temp 97.9°F | Resp 20 | Ht 72.0 in | Wt 148.0 lb

## 2019-01-31 DIAGNOSIS — E785 Hyperlipidemia, unspecified: Secondary | ICD-10-CM

## 2019-01-31 DIAGNOSIS — I447 Left bundle-branch block, unspecified: Secondary | ICD-10-CM | POA: Diagnosis not present

## 2019-01-31 DIAGNOSIS — I255 Ischemic cardiomyopathy: Secondary | ICD-10-CM | POA: Diagnosis not present

## 2019-01-31 DIAGNOSIS — I7410 Embolism and thrombosis of unspecified parts of aorta: Secondary | ICD-10-CM

## 2019-01-31 DIAGNOSIS — I25119 Atherosclerotic heart disease of native coronary artery with unspecified angina pectoris: Secondary | ICD-10-CM | POA: Diagnosis not present

## 2019-01-31 DIAGNOSIS — Z955 Presence of coronary angioplasty implant and graft: Secondary | ICD-10-CM | POA: Diagnosis not present

## 2019-01-31 DIAGNOSIS — Z7901 Long term (current) use of anticoagulants: Secondary | ICD-10-CM

## 2019-01-31 DIAGNOSIS — Z720 Tobacco use: Secondary | ICD-10-CM

## 2019-01-31 DIAGNOSIS — Z8673 Personal history of transient ischemic attack (TIA), and cerebral infarction without residual deficits: Secondary | ICD-10-CM

## 2019-01-31 DIAGNOSIS — I712 Thoracic aortic aneurysm, without rupture, unspecified: Secondary | ICD-10-CM

## 2019-01-31 DIAGNOSIS — I10 Essential (primary) hypertension: Secondary | ICD-10-CM

## 2019-01-31 MED ORDER — METOPROLOL SUCCINATE ER 25 MG PO TB24: 12.5000 mg | ORAL_TABLET | Freq: Every day | ORAL | 3 refills | Status: DC

## 2019-01-31 MED ORDER — IOPAMIDOL (ISOVUE-370) INJECTION 76%
75.0000 mL | Freq: Once | INTRAVENOUS | Status: AC | PRN
  Administered 2019-01-31: 75 mL via INTRAVENOUS

## 2019-01-31 NOTE — Assessment & Plan Note (Signed)
Moderate to severely reduced EF of 35 to 40% with anterior hypokinesis and IVCD on EKG.  We will need to clarify with Dr. Curt Bears reasons for foregoing CRT-D evaluation.  Currently on carvedilol and Entresto, but clinic switch to lower dose Toprol because of dizziness. Euvolemic on exam

## 2019-01-31 NOTE — Assessment & Plan Note (Addendum)
Follow-up CT scan showed no evidence of atheroma. Plan: Continue aggressive blood pressure control, and will need to like to treat lipids.  Mentioned complete smoking cessation counseling -> blood pressure and lipid control.. Plan for now will be to continue on warfarin with plans to continue for supper between 6 to 12 months.  Can discuss in follow-up.

## 2019-01-31 NOTE — Assessment & Plan Note (Addendum)
Anterior MI in February 18, 2018.  Almost a year out.  Had LAD PCI with diagonal branch PTCA.  There was initial improvement in the EF.  Took a little while for him to actually get on goal-directed therapy with Entresto because of low blood pressures.  Currently now on low-dose Entresto and Crestor. Seems euvolemic without diuretic.  Plan:  Was converted to Plavix -> preferably uninterrupted until end of December 2020.  After that okay to hold 5 to 7 days preop for procedures.  Not on aspirin because of Plavix plus warfarin.  Converted from [place]  On best possible regimen.  Converting from Crestor to Toprol because of low static hypotension.

## 2019-01-31 NOTE — Assessment & Plan Note (Signed)
Reduced EF 35 to 40% with IVCD.  Will readdress potential options with electrophysiology.

## 2019-01-31 NOTE — Assessment & Plan Note (Signed)
Smoking cessation instruction/counseling given:  counseled patient on the dangers of tobacco use, advised patient to stop smoking, and reviewed strategies to maximize success 

## 2019-01-31 NOTE — Patient Instructions (Signed)
Continue all previous medications without any changes at this time  

## 2019-01-31 NOTE — Assessment & Plan Note (Signed)
Unfortunately, lipid panel from September does not correlate well with that from August.   He does not have statin listed on his medication list.  We will need to restart rosuvastatin.

## 2019-01-31 NOTE — Progress Notes (Signed)
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  Patient has history of coronary artery disease status post PCI and stenting of the left anterior descending coronary artery following acute myocardial infarction in December 2019, ischemic cardiomyopathy, left bundle branch block, hypertension, remote history of TIA, and history of Warthin's tumor who presented acutely to the emergency department at [facility] last August with symptoms of left arm numbness and a brief episode of chest pain.  He underwent CT angiogram of the chest which revealed a filling defect in the distal ascending thoracic aorta consistent with spontaneous thrombus.  I had the opportunity of seeing him in consultation at that time and we recommended systemic anticoagulation using warfarin for a minimum of 3 months.  The patient has done well clinically and underwent follow-up CT angiogram earlier today.  He reports no significant problems or complaints and he specifically denies any transient neurologic signs or episodes of chest pain.   Current Outpatient Medications  Medication Sig Dispense Refill  . carvedilol (COREG) 3.125 MG tablet Take 1 tablet (3.125 mg total) by mouth 2 (two) times daily. 60 tablet 5  . nitroGLYCERIN (NITROSTAT) 0.4 MG SL tablet Place 1 tablet (0.4 mg total) under the tongue every 5 (five) minutes as needed for chest pain. 25 tablet 3  . sacubitril-valsartan (ENTRESTO) 24-26 MG Take 1 tablet by mouth 2 (two) times daily. 60 tablet 8  . tamsulosin (FLOMAX) 0.4 MG CAPS capsule Take 1 capsule (0.4 mg total) by  mouth daily after supper. 30 capsule 1  . warfarin (COUMADIN) 10 MG tablet Take 1-2 tablets daily as directed by coumadin clinic 90 tablet 0   No current facility-administered medications for this visit.       Physical Exam:   BP 132/86 (BP Location: Right Arm)   Pulse 78   Temp 97.9 F (36.6 C) (Skin)   Resp 20   Ht 6' (1.829 m)   Wt 148 lb (67.1 kg)   SpO2 98% Comment: RA  BMI 20.07 kg/m   General:  Well-appearing  Chest:   Clear to auscultation  CV:   Regular rate and rhythm  Incisions:  n/a  Abdomen:  Soft nontender  Extremities:  Warm and well-perfused, pulses intact  Diagnostic Tests:  CT ANGIOGRAPHY CHEST WITH CONTRAST  TECHNIQUE: Multidetector CT imaging of the chest was performed using the standard protocol during bolus administration of intravenous contrast. Multiplanar CT image reconstructions and MIPs were obtained to evaluate the vascular anatomy.  CONTRAST:  62mL ISOVUE-370 IOPAMIDOL (ISOVUE-370) INJECTION 76%  COMPARISON:  November 03, 2018.  FINDINGS: Cardiovascular: Preferential opacification of the thoracic aorta. No evidence of thoracic aortic aneurysm or dissection. Normal heart size. No pericardial effusion. Mild coronary artery calcifications are noted.  Mediastinum/Nodes: No enlarged mediastinal, hilar, or axillary lymph nodes. Thyroid gland, trachea, and esophagus demonstrate no significant findings.  Lungs/Pleura: No pneumothorax or pleural effusion is noted. Stable 3 mm subpleural nodule is noted laterally right middle lobe best seen on image number 115 of series 7. No consolidative  process is noted.  Upper Abdomen: No acute abnormality.  Musculoskeletal: No chest wall abnormality. No acute or significant osseous findings.  Review of the MIP images confirms the above findings.  IMPRESSION: 1. No evidence of thoracic aortic aneurysm or dissection. 2. Mild coronary artery calcifications are noted suggesting coronary  artery disease. 3. Aortic atherosclerosis.  Aortic Atherosclerosis (ICD10-I70.0).   Electronically Signed   By: Marijo Conception M.D.   On: 01/31/2019 12:38   Impression:  I have personally reviewed the patient's CT angiogram that was performed earlier today.  There is no sign of any residual thrombus in the ascending thoracic aorta and there is no sign of significant aortic aneurysm or dissection.  There is no evidence for any mural thrombus whatsoever.    Plan:  We have not recommended any changes to the patient's current medications but it might be reasonable to consider stopping systemic anticoagulation using warfarin if there is no evidence of hypercoagulable state.  We will defer long-term management of anticoagulation therapy to Dr. Ellyn Hack and colleagues.  Patient will call and return to our office in the future only should specific problems or questions arise.  I spent in excess of 10 minutes during the conduct of this office consultation and >50% of this time involved direct face-to-face encounter with the patient for counseling and/or coordination of their care.   Valentina Gu. Roxy Manns, MD 01/31/2019 1:37 PM

## 2019-01-31 NOTE — Patient Instructions (Addendum)
Medication Instructions:   STOP  COREG ( CARVEDILOL)  START METOPROLOL SUCCINATE ( TOPROL) 12.5 MG  ( 1/2 TABLET OF 25 MG TOPROL)  *If you need a refill on your cardiac medications before your next appointment, please call your pharmacy*  Lab Work:  ON 02/02/19  CBC CMP LIPID If you have labs (blood work) drawn today and your tests are completely normal, you will receive your results only by: Marland Kitchen MyChart Message (if you have MyChart) OR . A paper copy in the mail If you have any lab test that is abnormal or we need to change your treatment, we will call you to review the results.  Testing/Procedures: NOT NEEDED  Follow-Up: At [facility], you and your health needs are our priority.  As part of our continuing mission to provide you with exceptional heart care, we have created designated Provider Care Teams.  These Care Teams include your primary Cardiologist (physician) and Advanced Practice Providers (APPs -  Physician Assistants and Nurse Practitioners) who all work together to provide you with the care you need, when you need it.  Your next appointment:   4 month(s)  The format for your next appointment:   In Person  Provider:   Glenetta Hew, MD  Other Instructions --We will also plan to reinitiate cholesterol-lowering medication.

## 2019-01-31 NOTE — Assessment & Plan Note (Signed)
Borderline blood pressure today.  But has had significant orthostatic symptoms.  Plan for now would continue Entresto and convert carvedilol to Toprol 12.5 mg

## 2019-01-31 NOTE — Progress Notes (Signed)
Primary Care Provider: Azzie Glatter, FNP Cardiologist: Glenetta Hew, MD Electrophysiologist:   Clinic Note: Chief Complaint  Patient presents with  . Follow-up    6-7 months.  . Dizziness  . Coronary Artery Disease  . Cardiomyopathy    HPI:    Daniel Weiss is a 58 y.o. male with a PMH notable for anterior STEMI-PCI to LAD with resultant ICM (EF stable at 35 to 40%) and LEFT BUNDLE [place] with recent CVA (10/2018) who presents today for 68-month follow-up. -> He was also recently diagnosed with metastatic prostate cancer at the time of his CVA.  Daniel Weiss was last seen on November 29, 2018 in hospital follow-up after his stroke with concern for aortic atheroma.  Was converted to Plavix and warfarin. -> We converted from ACE inhibitor to low-dose Entresto and cut down carvedilol dose--unable to be titrated up due to borderline hypotension.  Plan was for him to be seen by electrophysiology.  However on the day of his visit, Dr. Curt Bears reviewed the chart and felt that he would not meet criteria at the time.  Unfortunately, there was no note with explanation.  Recent Hospitalizations: None  Reviewed  CV studies:    The following studies were reviewed today: (if available, images/films reviewed: From Epic Chart or Care Everywhere) . No new studies . CTA Chest - No TAA or dissection.  NO atheroma. Mild calcifiction/atherosclerosis.   Interval History:   Daniel Weiss returns here today overall feeling well.  He is not really having any major issues from a cardiac standpoint.  He notes that his upper extremities motion the right side but also occasional on the left will start getting tingling and numb while he is sleeping.  Usually goes away when he gets up and starts moving.  The most notable cardiac symptom he has is feeling lightheaded and dizzy when he stands up quickly.  He has not had any syncope or near syncope.  No anginal symptoms or heart failure symptoms with  rest or exertion. He has started eating better and putting on a bit of weight.  He is starting to get more active now somewhat recuperating from his stroke and diagnosis of prostate cancer.  CV Review of Symptoms (Summary) positive for - dyspnea on exertion and Maybe a little bit of exercise fatigue, not yet back to full level exercise negative for - chest pain, edema, irregular heartbeat, orthopnea, palpitations, paroxysmal nocturnal dyspnea, rapid heart rate, shortness of breath or Syncope/near syncope, TIA/amaurosis fugax, claudication.  The patient does not have symptoms concerning for COVID-19 infection (fever, chills, cough, or new shortness of breath).  The patient is practicing social distancing. ++ Masking.  Goes out occasionally for groceries/shopping usually either he or his wife goes in and the other stays in the car..  Not back at work   REVIEWED OF SYSTEMS   A comprehensive ROS was performed. Review of Systems  Constitutional: Positive for malaise/fatigue (Gradually building up his energy level.). Negative for weight loss (Stabilized).  HENT: Negative for congestion and nosebleeds.   Eyes: Negative for pain and discharge.  Respiratory: Positive for cough (Morning nonproductive cough.). Negative for shortness of breath (Baseline).   Cardiovascular: Negative for claudication and leg swelling.  Gastrointestinal: Negative for blood in stool and melena.  Genitourinary: Negative for frequency and hematuria.  Musculoskeletal: Negative for falls and joint pain.  Neurological: Positive for dizziness (Positional mostly, orthostatic) and headaches. Negative for weakness (Still somewhat weak but getting stronger).  Endo/Heme/Allergies: Negative  for environmental allergies. Does not bruise/bleed easily.  Psychiatric/Behavioral: Negative for depression and memory loss. The patient is nervous/anxious (Anxiety level is improving) and has insomnia.        Still coming to grips with diagnosis  of stroke, cancer and CAD.    -Morning cough some shortness of breath with exertion.  Off-and-on abdominal pain, headaches.  Nervous and anxious about new diagnosis of cancer.  I have reviewed and (if needed) personally updated the patient's problem list, medications, allergies, past medical and surgical history, social and family history.   PAST MEDICAL HISTORY   Past Medical History:  Diagnosis Date  . Abnormal chest CT 02/2018   a. Ectatic 3 cm Ao arch - rec f/u outpt imaging w/ CTA or MRA. Ectatic atheromatous abd Ao @ risk for aneurysm - rec f/u u/s in 5 yrs. Marked prostatic enlargement w/ scattered small sclerotic foci in the thoracic lower lumbar spine, sacrum, left 12th rib, pelvis, and right femur.  Recommend elective outpatient whole-body bone scintigraphy and PSA.  Marland Kitchen CAD (coronary artery disease)    a. 02/2018 ACS/PCI: LM nl, LAD 85p (3.5x15 Sierra DES), D1 45ost, RI 55ost, RCA nl, EF 25-35%.  . Complete left bundle branch block (LBBB) 2016  . Current every day smoker   . Elevated PSA 11/2018  . Enlarged prostate   . HFrEF (heart failure with reduced ejection fraction) ([place])   . Hypertension   . Ischemic cardiomyopathy 02/2018   a. 02/2018 Echo: EF 35-40% (LV gram on cath was 25-30%), mod, diff HK. mid-apicalanteroseptal, ant, and apical sev HK. RVH.-->  No notable change on echo from June 2020 and August 2020.  Marland Kitchen Myocardial infarction ([place])   . NSTEMI (non-ST elevated myocardial infarction) ([place]) 02/2018  . Stroke ([place])   . TIA (transient ischemic attack) 2016  . Warthin's tumor     PAST SURGICAL HISTORY   Past Surgical History:  Procedure Laterality Date  . CORONARY STENT INTERVENTION N/A 03/06/2018   Procedure: CORONARY STENT INTERVENTION;  Surgeon: [name], MD;  Location: [facility];  Service: Cardiovascular: Proximal LAD 85% stenosis (and D1): DES PCI with Xience Sierra DES 3.5 mm x 15 mm - 3.9 mm  . INTRAOPERATIVE TRANSTHORACIC ECHOCARDIOGRAM   03/06/2018   (Peri-MI) mild reduced EF 35-40%.  Diffuse hypokinesis (severe HK of the mid-apical anteroseptal, anterior and apical myocardium consistent with LAD infarct).  Unable to assess diastolic function.  Paradoxical septal motion likely related to his LBBB.  RV hypertrophy noted.  Trivial pericardial effusion noted.   Marland Kitchen LEFT HEART CATH AND CORONARY ANGIOGRAPHY N/A 03/06/2018   Procedure: LEFT HEART CATH AND CORONARY ANGIOGRAPHY;  Surgeon: [name], MD;  Location: [facility];  Service: Cardiovascular: Proximal LAD 85% involving D1 45%.  Ostial RI 55%.  Severe LV dysfunction EF 25 to 30%.  1+ MR.  Only minimally elevated LVEDP.  Marland Kitchen TRANSTHORACIC ECHOCARDIOGRAM  10/2018   (Most recent echo, to evaluate for TIA/CVA) : Moderate reduced EF 35 to 40%.  Moderate concentric LVH.  Mild dyskinesis of the apex and severe hypokinesis of mid apical anteroseptal and anterior wall.  Unable to assess RV pressures.  Aortic sclerosis with no stenosis.  No significant change seen    Cardiac Cath-PCI 03/06/2018: Proximal LAD 85% stenosis involving D1(DES PCI with Xience Sierra 3.5 mm x 15 mm-3.9 mm). Ostial RI 55%. Severe LV dysfunction with EF of 25-35%. 1+ MR. Minimally elevated LVEDP.Marland Kitchen    MEDICATIONS/ALLERGIES   Current Meds  Medication Sig  . nitroGLYCERIN (NITROSTAT) 0.4 MG SL tablet Place 1 tablet (0.4 mg total) under the tongue every 5 (five) minutes as needed for chest pain.  . sacubitril-valsartan (ENTRESTO) 24-26 MG Take 1 tablet by mouth 2 (two) times daily.  . tamsulosin (FLOMAX) 0.4 MG CAPS capsule Take 1 capsule (0.4 mg total) by mouth daily after supper.  . warfarin (COUMADIN) 10 MG tablet Take 1-2 tablets daily as directed by coumadin clinic  . [DISCONTINUED] carvedilol (COREG) 3.125 MG tablet Take 1 tablet (3.125 mg total) by mouth 2 (two) times daily.    No Known Allergies   SOCIAL HISTORY/FAMILY HISTORY   Social History   Tobacco Use  . Smoking status: Current  Every Day Smoker    Packs/day: 0.50    Types: Cigarettes  . Smokeless tobacco: Never Used  Substance Use Topics  . Alcohol use: Yes    Alcohol/week: 2.0 standard drinks    Types: 2 Cans of beer per week    Comment: 2 Beers daily.  . Drug use: Yes    Frequency: 3.0 times per week    Types: Marijuana   Social History   Social History Narrative  . Not on file    Family History family history includes Breast cancer in an other family member; Diabetes type II in his mother; Hypertension in his mother; Leukemia in an other family member; Stroke in his brother and mother.   OBJCTIVE -PE, EKG, labs   Wt Readings from Last 3 Encounters:  01/31/19 149 lb (67.6 kg)  01/31/19 148 lb (67.1 kg)  12/30/18 145 lb 6.4 oz (66 kg)    Physical Exam: BP 120/80 (BP Location: Left Arm, Patient Position: Sitting, Cuff Size: Normal)   Pulse 71   Temp (!) 96.9 F (36.1 C)   Ht 6' (1.829 m)   Wt 149 lb (67.6 kg)   BMI 20.21 kg/m  Physical Exam  Constitutional: He appears well-developed and well-nourished. No distress.  Looks a little physically stronger.  Healthier-appearing overall.  Well-groomed  HENT:  Head: Normocephalic and atraumatic.  Neck: Normal range of motion. Neck supple. No JVD present. Carotid bruit is not present (No left sided bruit, but difficult to assess right because of mass).  Right side mass stable.  Cardiovascular: Normal rate, regular rhythm and intact distal pulses.  Occasional extrasystoles are present. PMI is not displaced. Exam reveals gallop, S4 and a midsystolic click. Exam reveals no decreased pulses.  Murmur heard. Pulmonary/Chest: Effort normal and breath sounds normal. No respiratory distress. He has no wheezes. He has no rales.  Abdominal: Soft. Bowel sounds are normal. He exhibits no distension and no mass. There is no abdominal tenderness. There is no rebound and no guarding.  Vitals reviewed. Adult ECG Report  Rate: 71 ;  Rhythm: normal sinus rhythm and  LVH with LBBB features of IVCD.  Inferior MI, age undetermined.  Cannot exclude ischemia with T wave inversions.;   Narrative Interpretation: Stable EKG  Recent Labs: On PCP labs, calculated LDL 67 Lab Results  Component Value Date   CHOL 212 (H) 11/22/2018   HDL 57 11/22/2018   LDLCALC 130 (H) 11/22/2018   TRIG 142 11/22/2018   CHOLHDL 3.7 11/22/2018   Lab Results  Component Value Date   CREATININE 0.77 11/22/2018   BUN 15 11/22/2018   NA 136 11/22/2018   K 4.6 11/22/2018   CL 100 11/22/2018   CO2 24 11/22/2018    ASSESSMENT/PLAN    Problem List Items Addressed  This Visit    Coronary artery disease involving native coronary artery of native heart with angina pectoris ([place]) - Primary (Chronic)    Anterior MI in February 18, 2018.  Almost a year out.  Had LAD PCI with diagonal branch PTCA.  There was initial improvement in the EF.  Took a little while for him to actually get on goal-directed therapy with Entresto because of low blood pressures.  Currently now on low-dose Entresto and Crestor. Seems euvolemic without diuretic.  Plan:  Was converted to Plavix -> preferably uninterrupted until end of December 2020.  After that okay to hold 5 to 7 days preop for procedures.  Not on aspirin because of Plavix plus warfarin.  Converted from [place]  On best possible regimen.  Converting from Crestor to Toprol because of low static hypotension.      Relevant Medications   metoprolol succinate (TOPROL XL) 25 MG 24 hr tablet   Other Relevant Orders   EKG 12-Lead   Comprehensive metabolic panel   Ischemic cardiomyopathy (Chronic)    Moderate to severely reduced EF of 35 to 40% with anterior hypokinesis and IVCD on EKG.  We will need to clarify with Dr. Curt Bears reasons for foregoing CRT-D evaluation.  Currently on carvedilol and Entresto, but clinic switch to lower dose Toprol because of dizziness. Euvolemic on exam      Relevant Medications   metoprolol succinate (TOPROL XL)  25 MG 24 hr tablet   Other Relevant Orders   Lipid panel   Comprehensive metabolic panel   CBC   Aortic mural thrombus (HCC) (Chronic)    Follow-up CT scan showed no evidence of atheroma. Plan: Continue aggressive blood pressure control, and will need to like to treat lipids.  Mentioned complete smoking cessation counseling -> blood pressure and lipid control.. Plan for now will be to continue on warfarin with plans to continue for supper between 6 to 12 months.  Can discuss in follow-up.      Relevant Medications   metoprolol succinate (TOPROL XL) 25 MG 24 hr tablet   Other Relevant Orders   CBC   Essential hypertension (Chronic)    Borderline blood pressure today.  But has had significant orthostatic symptoms.  Plan for now would continue Entresto and convert carvedilol to Toprol 12.5 mg      Relevant Medications   metoprolol succinate (TOPROL XL) 25 MG 24 hr tablet   Tobacco abuse (Chronic)    Smoking cessation instruction/counseling given:  counseled patient on the dangers of tobacco use, advised patient to stop smoking, and reviewed strategies to maximize success      Hyperlipidemia LDL goal <70 (Chronic)    Unfortunately, lipid panel from September does not correlate well with that from August.   He does not have statin listed on his medication list.  We will need to restart rosuvastatin.      Relevant Medications   metoprolol succinate (TOPROL XL) 25 MG 24 hr tablet   Other Relevant Orders   Lipid panel   Presence of drug coated stent in LAD coronary artery (Chronic)   Relevant Orders   Comprehensive metabolic panel   CBC   Complete left bundle branch block (LBBB) (Chronic)    Reduced EF 35 to 40% with IVCD.  Will readdress potential options with electrophysiology.      Relevant Medications   metoprolol succinate (TOPROL XL) 25 MG 24 hr tablet   Other Relevant Orders   EKG 12-Lead   History of TIA (transient ischemic  attack)   Long term (current) use of  anticoagulants   Relevant Orders   Comprehensive metabolic panel   CBC       COVID-19 Education: The signs and symptoms of COVID-19 were discussed with the patient and how to seek care for testing (follow up with PCP or arrange E-visit).   The importance of social distancing was discussed today.  I spent a total of 3minutes with the patient and chart review. >  50% of the time was spent in direct patient consultation.  Additional time spent with chart review (studies, outside notes, etc): 8 Total Time: 24 min   Current medicines are reviewed at length with the patient today.  (+/- concerns) n/a   Patient Instructions / Medication Changes & Studies & Tests Ordered   Patient Instructions  Medication Instructions:   STOP  COREG ( CARVEDILOL)  START METOPROLOL SUCCINATE ( TOPROL) 12.5 MG  ( 1/2 TABLET OF 25 MG TOPROL)  *If you need a refill on your cardiac medications before your next appointment, please call your pharmacy*  Lab Work:  ON 02/02/19  CBC CMP LIPID If you have labs (blood work) drawn today and your tests are completely normal, you will receive your results only by: Marland Kitchen MyChart Message (if you have MyChart) OR . A paper copy in the mail If you have any lab test that is abnormal or we need to change your treatment, we will call you to review the results.  Testing/Procedures: NOT NEEDED  Follow-Up: At [facility], you and your health needs are our priority.  As part of our continuing mission to provide you with exceptional heart care, we have created designated Provider Care Teams.  These Care Teams include your primary Cardiologist (physician) and Advanced Practice Providers (APPs -  Physician Assistants and Nurse Practitioners) who all work together to provide you with the care you need, when you need it.  Your next appointment:   4 month(s)  The format for your next appointment:   In Person  Provider:   Glenetta Hew, MD  Other Instructions --We  will also plan to reinitiate cholesterol-lowering medication.    Studies Ordered:   Orders Placed This Encounter  Procedures  . Lipid panel  . Comprehensive metabolic panel  . CBC  . EKG 12-Lead     Glenetta Hew, M.D., M.S. Interventional Cardiologist   Pager # [phone] Phone # [phone] [address] [place], [place] [address]   Thank you for choosing Heartcare at [facility]!!

## 2019-02-02 ENCOUNTER — Other Ambulatory Visit: Payer: Self-pay

## 2019-02-02 ENCOUNTER — Ambulatory Visit (INDEPENDENT_AMBULATORY_CARE_PROVIDER_SITE_OTHER): Payer: Medicaid Other | Admitting: Pharmacist

## 2019-02-02 DIAGNOSIS — Z7901 Long term (current) use of anticoagulants: Secondary | ICD-10-CM | POA: Diagnosis not present

## 2019-02-02 DIAGNOSIS — Z8673 Personal history of transient ischemic attack (TIA), and cerebral infarction without residual deficits: Secondary | ICD-10-CM

## 2019-02-02 LAB — CBC
Hematocrit: 35.2 % — ABNORMAL LOW (ref 37.5–51.0)
Hemoglobin: 11 g/dL — ABNORMAL LOW (ref 13.0–17.7)
MCH: 23.8 pg — ABNORMAL LOW (ref 26.6–33.0)
MCHC: 31.3 g/dL — ABNORMAL LOW (ref 31.5–35.7)
MCV: 76 fL — ABNORMAL LOW (ref 79–97)
Platelets: 460 10*3/uL — ABNORMAL HIGH (ref 150–450)
RBC: 4.62 x10E6/uL (ref 4.14–5.80)
RDW: 19.3 % — ABNORMAL HIGH (ref 11.6–15.4)
WBC: 6.4 10*3/uL (ref 3.4–10.8)

## 2019-02-02 LAB — COMPREHENSIVE METABOLIC PANEL
ALT: 8 IU/L (ref 0–44)
AST: 17 IU/L (ref 0–40)
Albumin/Globulin Ratio: 1.5 (ref 1.2–2.2)
Albumin: 4.2 g/dL (ref 3.8–4.9)
Alkaline Phosphatase: 65 IU/L (ref 39–117)
BUN/Creatinine Ratio: 14 (ref 9–20)
BUN: 10 mg/dL (ref 6–24)
Bilirubin Total: 0.3 mg/dL (ref 0.0–1.2)
CO2: 23 mmol/L (ref 20–29)
Calcium: 9.4 mg/dL (ref 8.7–10.2)
Chloride: 103 mmol/L (ref 96–106)
Creatinine, Ser: 0.74 mg/dL — ABNORMAL LOW (ref 0.76–1.27)
GFR calc Af Amer: 117 mL/min/{1.73_m2} (ref >59)
GFR calc non Af Amer: 101 mL/min/{1.73_m2} (ref >59)
Globulin, Total: 2.8 g/dL (ref 1.5–4.5)
Glucose: 70 mg/dL (ref 65–99)
Potassium: 4.9 mmol/L (ref 3.5–5.2)
Sodium: 139 mmol/L (ref 134–144)
Total Protein: 7 g/dL (ref 6.0–8.5)

## 2019-02-02 LAB — LIPID PANEL
Chol/HDL Ratio: 3.6 ratio (ref 0.0–5.0)
Cholesterol, Total: 178 mg/dL (ref 100–199)
HDL: 50 mg/dL (ref >39)
LDL Chol Calc (NIH): 94 mg/dL (ref 0–99)
Triglycerides: 198 mg/dL — ABNORMAL HIGH (ref 0–149)
VLDL Cholesterol Cal: 34 mg/dL (ref 5–40)

## 2019-02-02 LAB — POCT INR: INR: 2.1 (ref 2.0–3.0)

## 2019-02-09 ENCOUNTER — Telehealth: Payer: Self-pay | Admitting: *Deleted

## 2019-02-09 DIAGNOSIS — I25119 Atherosclerotic heart disease of native coronary artery with unspecified angina pectoris: Secondary | ICD-10-CM

## 2019-02-09 DIAGNOSIS — E785 Hyperlipidemia, unspecified: Secondary | ICD-10-CM

## 2019-02-09 DIAGNOSIS — Z79899 Other long term (current) drug therapy: Secondary | ICD-10-CM

## 2019-02-09 MED ORDER — ROSUVASTATIN CALCIUM 40 MG PO TABS: 40.0000 mg | ORAL_TABLET | Freq: Every day | ORAL | 6 refills | Status: DC

## 2019-02-09 NOTE — Telephone Encounter (Signed)
-----   Message from [name], MD sent at 02/09/2019 11:55 AM EST ----- Cholesterol panel looks better than it did 2 months ago.  LDL looks in a more manageable range.  I would like to restart the rosuvastatin -try 40 mg every other day for 2 weeks and then increase to every day if tolerated.  Recheck labs (lipids and LFTs) in 3-4 months.  Glenetta Hew, MD

## 2019-02-09 NOTE — Telephone Encounter (Signed)
The patient has been notified of the result and verbalized understanding.  All questions (if any) were answered.  prescription e-sent to pharmacy   labslip sent Raiford Simmonds, RN 02/09/2019 2:18 PM

## 2019-02-09 NOTE — Telephone Encounter (Signed)
Called , no answer - phone hangs up , will try again

## 2019-02-21 ENCOUNTER — Encounter: Payer: Self-pay | Admitting: Family Medicine

## 2019-02-21 ENCOUNTER — Ambulatory Visit: Payer: Self-pay | Admitting: Family Medicine

## 2019-02-21 ENCOUNTER — Other Ambulatory Visit: Payer: Self-pay

## 2019-02-21 ENCOUNTER — Ambulatory Visit (INDEPENDENT_AMBULATORY_CARE_PROVIDER_SITE_OTHER): Payer: Medicaid Other | Admitting: Family Medicine

## 2019-02-21 VITALS — BP 139/96 | HR 71 | Temp 97.6°F | Ht 72.0 in | Wt 148.4 lb

## 2019-02-21 DIAGNOSIS — Z7901 Long term (current) use of anticoagulants: Secondary | ICD-10-CM

## 2019-02-21 DIAGNOSIS — R972 Elevated prostate specific antigen [PSA]: Secondary | ICD-10-CM

## 2019-02-21 DIAGNOSIS — I25118 Atherosclerotic heart disease of native coronary artery with other forms of angina pectoris: Secondary | ICD-10-CM

## 2019-02-21 DIAGNOSIS — Z720 Tobacco use: Secondary | ICD-10-CM

## 2019-02-21 DIAGNOSIS — N400 Enlarged prostate without lower urinary tract symptoms: Secondary | ICD-10-CM | POA: Diagnosis not present

## 2019-02-21 DIAGNOSIS — I10 Essential (primary) hypertension: Secondary | ICD-10-CM | POA: Diagnosis not present

## 2019-02-21 DIAGNOSIS — I252 Old myocardial infarction: Secondary | ICD-10-CM | POA: Insufficient documentation

## 2019-02-21 DIAGNOSIS — Z09 Encounter for follow-up examination after completed treatment for conditions other than malignant neoplasm: Secondary | ICD-10-CM

## 2019-02-21 DIAGNOSIS — Z8673 Personal history of transient ischemic attack (TIA), and cerebral infarction without residual deficits: Secondary | ICD-10-CM

## 2019-02-21 HISTORY — DX: Old myocardial infarction: I25.2

## 2019-02-21 LAB — POCT URINALYSIS DIPSTICK
Bilirubin, UA: NEGATIVE
Glucose, UA: NEGATIVE
Ketones, UA: NEGATIVE
Leukocytes, UA: NEGATIVE
Nitrite, UA: NEGATIVE
Protein, UA: NEGATIVE
Spec Grav, UA: 1.01 (ref 1.010–1.025)
Urobilinogen, UA: 0.2 E.U./dL
pH, UA: 5.5 (ref 5.0–8.0)

## 2019-02-21 NOTE — Progress Notes (Signed)
Patient [facility]   Established Patient Office Visit  Subjective:  Patient ID: Dah Euresti, male    DOB: 05-Mar-1961  Age: 58 y.o. MRN: [id number]  CC:  Chief Complaint  Patient presents with  . Follow-up    HTN    HPI Jabril Yell is a 58 year male who presents for Follow Up today.   Past Medical History:  Diagnosis Date  . Abnormal chest CT 02/2018   a. Ectatic 3 cm Ao arch - rec f/u outpt imaging w/ CTA or MRA. Ectatic atheromatous abd Ao @ risk for aneurysm - rec f/u u/s in 5 yrs. Marked prostatic enlargement w/ scattered small sclerotic foci in the thoracic lower lumbar spine, sacrum, left 12th rib, pelvis, and right femur.  Recommend elective outpatient whole-body bone scintigraphy and PSA.  Marland Kitchen CAD (coronary artery disease)    a. 02/2018 ACS/PCI: LM nl, LAD 85p (3.5x15 Sierra DES), D1 45ost, RI 55ost, RCA nl, EF 25-35%.  . Complete left bundle branch block (LBBB) 2016  . Current every day smoker   . Elevated PSA 11/2018  . Enlarged prostate   . HFrEF (heart failure with reduced ejection fraction) ([place])   . Hypertension   . Ischemic cardiomyopathy 02/2018   a. 02/2018 Echo: EF 35-40% (LV gram on cath was 25-30%), mod, diff HK. mid-apicalanteroseptal, ant, and apical sev HK. RVH.-->  No notable change on echo from June 2020 and August 2020.  Marland Kitchen Myocardial infarction ([place])   . NSTEMI (non-ST elevated myocardial infarction) ([place]) 02/2018  . Stroke ([place])   . TIA (transient ischemic attack) 2016  . Warthin's tumor    Current Status: Since his last office visit, he is doing well with no complaints. He continues to follow up with Dr. Ellyn Hack, Cardiologist. He is scheduled to follow up with him on 05/2019.He continues to take Warfarin as prescribed. His blood pressures are elevated today, but he reports drinking coffee and smoking cigarettes prior to appointment. He denies visual changes, chest pain, cough, shortness of breath, heart  palpitations, and falls. He has occasional headaches and dizziness with position changes. Denies severe headaches, confusion, seizures, double vision, and blurred vision, nausea and vomiting. He is unsure if he has had follow up with Urology, which he was referred 11/2018. He denies fevers, chills, fatigue, recent infections, weight loss, and night sweats. No reports of GI problems such as nausea, vomiting, diarrhea, and constipation. He has no reports of blood in stools, dysuria and hematuria. No depression or anxiety, and denies suicidal ideations, homicidal ideations, or auditory hallucinations. He denies pain today.    Past Surgical History:  Procedure Laterality Date  . CORONARY STENT INTERVENTION N/A 03/06/2018   Procedure: CORONARY STENT INTERVENTION;  Surgeon: [name], MD;  Location: [facility];  Service: Cardiovascular: Proximal LAD 85% stenosis (and D1): DES PCI with Xience Sierra DES 3.5 mm x 15 mm - 3.9 mm  . INTRAOPERATIVE TRANSTHORACIC ECHOCARDIOGRAM  03/06/2018   (Peri-MI) mild reduced EF 35-40%.  Diffuse hypokinesis (severe HK of the mid-apical anteroseptal, anterior and apical myocardium consistent with LAD infarct).  Unable to assess diastolic function.  Paradoxical septal motion likely related to his LBBB.  RV hypertrophy noted.  Trivial pericardial effusion noted.   Marland Kitchen LEFT HEART CATH AND CORONARY ANGIOGRAPHY N/A 03/06/2018   Procedure: LEFT HEART CATH AND CORONARY ANGIOGRAPHY;  Surgeon: [name], MD;  Location: [name];  Service: Cardiovascular: Proximal LAD 85% involving  D1 45%.  Ostial RI 55%.  Severe LV dysfunction EF 25 to 30%.  1+ MR.  Only minimally elevated LVEDP.  Marland Kitchen TRANSTHORACIC ECHOCARDIOGRAM  10/2018   (Most recent echo, to evaluate for TIA/CVA) : Moderate reduced EF 35 to 40%.  Moderate concentric LVH.  Mild dyskinesis of the apex and severe hypokinesis of mid apical anteroseptal and anterior wall.  Unable to assess RV pressures.  Aortic  sclerosis with no stenosis.  No significant change seen    Family History  Problem Relation Age of Onset  . Stroke Mother   . Hypertension Mother   . Diabetes type II Mother   . Leukemia Other   . Breast cancer Other   . Stroke Brother   . CAD Neg Hx     Social History   Socioeconomic History  . Marital status: Married    Spouse name: Dorothenia  . Number of children: Not on file  . Years of education: Not on file  . Highest education level: Not on file  Occupational History  . Occupation: cook  Tobacco Use  . Smoking status: Current Every Day Smoker    Packs/day: 0.50    Types: Cigarettes  . Smokeless tobacco: Never Used  Substance and Sexual Activity  . Alcohol use: Yes    Alcohol/week: 2.0 standard drinks    Types: 2 Cans of beer per week    Comment: 2 Beers daily.  . Drug use: Yes    Frequency: 3.0 times per week    Types: Marijuana  . Sexual activity: Not Currently  Other Topics Concern  . Not on file  Social History Narrative  . Not on file   Social Determinants of Health   Financial Resource Strain:   . Difficulty of Paying Living Expenses: Not on file  Food Insecurity:   . Worried About Charity fundraiser in the Last Year: Not on file  . Ran Out of Food in the Last Year: Not on file  Transportation Needs: No Transportation Needs  . Lack of Transportation (Medical): No  . Lack of Transportation (Non-Medical): No  Physical Activity:   . Days of Exercise per Week: Not on file  . Minutes of Exercise per Session: Not on file  Stress:   . Feeling of Stress : Not on file  Social Connections:   . Frequency of Communication with Friends and Family: Not on file  . Frequency of Social Gatherings with Friends and Family: Not on file  . Attends Religious Services: Not on file  . Active Member of Clubs or Organizations: Not on file  . Attends Archivist Meetings: Not on file  . Marital Status: Not on file  Intimate Partner Violence:   . Fear of  Current or Ex-Partner: Not on file  . Emotionally Abused: Not on file  . Physically Abused: Not on file  . Sexually Abused: Not on file    Outpatient Medications Prior to Visit  Medication Sig Dispense Refill  . metoprolol succinate (TOPROL XL) 25 MG 24 hr tablet Take 0.5 tablets (12.5 mg total) by mouth daily. 45 tablet 3  . nitroGLYCERIN (NITROSTAT) 0.4 MG SL tablet Place 1 tablet (0.4 mg total) under the tongue every 5 (five) minutes as needed for chest pain. 25 tablet 3  . rosuvastatin (CRESTOR) 40 MG tablet Take 1 tablet (40 mg total) by mouth daily. 30 tablet 6  . sacubitril-valsartan (ENTRESTO) 24-26 MG Take 1 tablet by mouth 2 (two) times daily. 60 tablet 8  .  tamsulosin (FLOMAX) 0.4 MG CAPS capsule Take 1 capsule (0.4 mg total) by mouth daily after supper. 30 capsule 1  . warfarin (COUMADIN) 10 MG tablet Take 1-2 tablets daily as directed by coumadin clinic 90 tablet 0   No facility-administered medications prior to visit.    No Known Allergies  ROS Review of Systems  Constitutional: Negative.   HENT: Negative.   Eyes: Negative.   Respiratory: Negative.   Cardiovascular: Negative.   Gastrointestinal: Negative.   Endocrine: Negative.   Genitourinary: Positive for urgency.  Musculoskeletal: Negative.   Skin: Negative.   Allergic/Immunologic: Negative.   Neurological: Negative.   Hematological: Negative.   Psychiatric/Behavioral: Negative.       Objective:    Physical Exam  Constitutional: He is oriented to person, place, and time. He appears well-developed.  HENT:  Head: Normocephalic and atraumatic.  Eyes: Conjunctivae are normal.  Neck:    Cardiovascular: Normal rate, regular rhythm, normal heart sounds and intact distal pulses.  Pulmonary/Chest: Effort normal and breath sounds normal.  Abdominal: Soft. Bowel sounds are normal.  Musculoskeletal:        General: Normal range of motion.     Cervical back: Normal range of motion and neck supple.    Neurological: He is oriented to person, place, and time. He has normal reflexes.  Skin: Skin is warm and dry.  Psychiatric: He has a normal mood and affect. His behavior is normal. Judgment and thought content normal.  Nursing note and vitals reviewed.   BP (!) 139/96 Comment: Smoke a cigarette before office  Pulse 71   Temp 97.6 F (36.4 C) (Oral)   Ht 6' (1.829 m)   Wt 148 lb 6.4 oz (67.3 kg)   SpO2 95%   BMI 20.13 kg/m  Wt Readings from Last 3 Encounters:  02/21/19 148 lb 6.4 oz (67.3 kg)  01/31/19 149 lb (67.6 kg)  01/31/19 148 lb (67.1 kg)     Health Maintenance Due  Topic Date Due  . Hepatitis C Screening  [birth year]  . TETANUS/TDAP  01/30/1979  . COLONOSCOPY  01/29/2010  . INFLUENZA VACCINE  10/09/2018    There are no preventive care reminders to display for this patient.  Lab Results  Component Value Date   TSH 1.950 11/22/2018   Lab Results  Component Value Date   WBC 6.4 02/02/2019   HGB 11.0 (L) 02/02/2019   HCT 35.2 (L) 02/02/2019   MCV 76 (L) 02/02/2019   PLT 460 (H) 02/02/2019   Lab Results  Component Value Date   NA 139 02/02/2019   K 4.9 02/02/2019   CO2 23 02/02/2019   GLUCOSE 70 02/02/2019   BUN 10 02/02/2019   CREATININE 0.74 (L) 02/02/2019   BILITOT 0.3 02/02/2019   ALKPHOS 65 02/02/2019   AST 17 02/02/2019   ALT 8 02/02/2019   PROT 7.0 02/02/2019   ALBUMIN 4.2 02/02/2019   CALCIUM 9.4 02/02/2019   ANIONGAP 12 11/11/2018   Lab Results  Component Value Date   CHOL 178 02/02/2019   Lab Results  Component Value Date   HDL 50 02/02/2019   Lab Results  Component Value Date   LDLCALC 94 02/02/2019   Lab Results  Component Value Date   TRIG 198 (H) 02/02/2019   Lab Results  Component Value Date   CHOLHDL 3.6 02/02/2019   Lab Results  Component Value Date   HGBA1C 5.3 11/04/2018      Assessment & Plan:   1. Essential hypertension He will  continue to take medications as prescribed, to decrease high sodium intake,  excessive alcohol intake, increase potassium intake, smoking cessation, and increase physical activity of at least 30 minutes of cardio activity daily. He will continue to follow Heart Healthy or DASH diet. - Urinalysis Dipstick  2. Elevated PSA Most recent PSA at 377. We will re-assess level today. Referral to Urology.  - PSA  3. Enlarged prostate  4. Coronary artery disease with stable angina pectoris, unspecified vessel or lesion type, unspecified whether native or transplanted heart ([place])  5. History of CVA (cerebrovascular accident) Stable. No signs or symptoms of recurrence noted or reported.   6. History of non-ST elevation myocardial infarction (NSTEMI)  7. Long term (current) use of anticoagulants Continue Coumadin as prescribed.   8. Tobacco use  9. Follow up He will follow up in 3 months.   No orders of the defined types were placed in this encounter.   Orders Placed This Encounter  Procedures  . PSA  . Urinalysis Dipstick    Referral Orders  No referral(s) requested today    Kathe Becton,  MSN, FNP-BC [facility] [address] [place], [place] [address] [phone] fax  Problem List Items Addressed This Visit      Cardiovascular and Mediastinum   Essential hypertension - Primary (Chronic)   Relevant Orders   Urinalysis Dipstick (Completed)     Other   Elevated PSA   Relevant Orders   PSA   Enlarged prostate   History of CVA (cerebrovascular accident)   Long term (current) use of anticoagulants    Other Visit Diagnoses    Coronary artery disease with stable angina pectoris, unspecified vessel or lesion type, unspecified whether native or transplanted heart [facility])       History of non-ST elevation myocardial infarction (NSTEMI)       Tobacco use       Follow up          No orders of the defined types were placed in this encounter.   Follow-up: Return in  about 3 months (around 05/22/2019).    Azzie Glatter, FNP

## 2019-02-22 LAB — PSA: Prostate Specific Ag, Serum: 387 ng/mL — ABNORMAL HIGH (ref 0.0–4.0)

## 2019-02-22 NOTE — Progress Notes (Signed)
Virtual Visit via Telephone Note   This visit type was conducted due to national recommendations for restrictions regarding the COVID-19 Pandemic (e.g. social distancing) in an effort to limit this patient's exposure and mitigate transmission in our community.  Due to his co-morbid illnesses, this patient is at least at moderate risk for complications without adequate follow up.  This format is felt to be most appropriate for this patient at this time.  The patient did not have access to video technology/had technical difficulties with video requiring transitioning to audio format only (telephone).  All issues noted in this document were discussed and addressed.  No physical exam could be performed with this format.  Please refer to the patient's chart for his  consent to telehealth for [facility].   Date:  02/23/2019   ID:  Daniel Weiss, DOB Jan 12, 1961, MRN [id number]  Patient Location: Home Provider Location: Home  PCP:  Azzie Glatter, FNP  Cardiologist:  Glenetta Hew, MD  Electrophysiologist:  None   Evaluation Performed:  Follow-Up Visit  Chief Complaint:  CHF follow up  History of Present Illness:    Daniel Weiss is a 58 y.o. male we are following for ongoing assessment and management of CAD, with hx of anterior STEMI with PCI to the LAD, and diagonal branch PTCA.  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 Other hx of CVA on Plavix ( to stop in December)  and Warfarin.  He is also on Entresto and Crestor.   He was last seen in the office by Dr. Ellyn Hack on 01/31/2019 and had some complaints of lightheadedness and dizziness when he standing up quickly.  He denied any chest pain, near syncope, or dyspnea.  He was becoming more active since he recovered from his CVA.  He is without cardiac complaint today on this phone visit. He denies bleeding, bruising or hemoptysis. He has not had LEE or DOE. He is medically compliant. He  has heard from urology concerning history of prostate cancer, and was told that it had progressed. He is worried about this and is due to see his urologist next month. He will be followed in our coumadin clinic at the [facility] on February 25, 2019. He is now off of Plavix.  The patient does not have symptoms concerning for COVID-19 infection (fever, chills, cough, or new shortness of breath).    Past Medical History:  Diagnosis Date  . Abnormal chest CT 02/2018   a. Ectatic 3 cm Ao arch - rec f/u outpt imaging w/ CTA or MRA. Ectatic atheromatous abd Ao @ risk for aneurysm - rec f/u u/s in 5 yrs. Marked prostatic enlargement w/ scattered small sclerotic foci in the thoracic lower lumbar spine, sacrum, left 12th rib, pelvis, and right femur.  Recommend elective outpatient whole-body bone scintigraphy and PSA.  Marland Kitchen CAD (coronary artery disease)    a. 02/2018 ACS/PCI: LM nl, LAD 85p (3.5x15 Sierra DES), D1 45ost, RI 55ost, RCA nl, EF 25-35%.  . Complete left bundle branch block (LBBB) 2016  . Current every day smoker   . Elevated PSA 11/2018  . Enlarged prostate   . HFrEF (heart failure with reduced ejection fraction) ([place])   . Hypertension   . Ischemic cardiomyopathy 02/2018   a. 02/2018 Echo: EF 35-40% (LV gram on cath was 25-30%), mod, diff HK. mid-apicalanteroseptal, ant, and apical sev HK. RVH.-->  No notable change on echo from June 2020 and August 2020.  Marland Kitchen  Myocardial infarction ([place])   . NSTEMI (non-ST elevated myocardial infarction) ([place]) 02/2018  . Stroke ([place])   . TIA (transient ischemic attack) 2016  . Warthin's tumor    Past Surgical History:  Procedure Laterality Date  . CORONARY STENT INTERVENTION N/A 03/06/2018   Procedure: CORONARY STENT INTERVENTION;  Surgeon: [name], MD;  Location: [facility];  Service: Cardiovascular: Proximal LAD 85% stenosis (and D1): DES PCI with Xience Sierra DES 3.5 mm x 15 mm - 3.9 mm  . INTRAOPERATIVE TRANSTHORACIC ECHOCARDIOGRAM   03/06/2018   (Peri-MI) mild reduced EF 35-40%.  Diffuse hypokinesis (severe HK of the mid-apical anteroseptal, anterior and apical myocardium consistent with LAD infarct).  Unable to assess diastolic function.  Paradoxical septal motion likely related to his LBBB.  RV hypertrophy noted.  Trivial pericardial effusion noted.   Marland Kitchen LEFT HEART CATH AND CORONARY ANGIOGRAPHY N/A 03/06/2018   Procedure: LEFT HEART CATH AND CORONARY ANGIOGRAPHY;  Surgeon: [name], MD;  Location: [facility];  Service: Cardiovascular: Proximal LAD 85% involving D1 45%.  Ostial RI 55%.  Severe LV dysfunction EF 25 to 30%.  1+ MR.  Only minimally elevated LVEDP.  Marland Kitchen TRANSTHORACIC ECHOCARDIOGRAM  10/2018   (Most recent echo, to evaluate for TIA/CVA) : Moderate reduced EF 35 to 40%.  Moderate concentric LVH.  Mild dyskinesis of the apex and severe hypokinesis of mid apical anteroseptal and anterior wall.  Unable to assess RV pressures.  Aortic sclerosis with no stenosis.  No significant change seen     Current Meds  Medication Sig  . metoprolol succinate (TOPROL XL) 25 MG 24 hr tablet Take 0.5 tablets (12.5 mg total) by mouth daily.  . nitroGLYCERIN (NITROSTAT) 0.4 MG SL tablet Place 1 tablet (0.4 mg total) under the tongue every 5 (five) minutes as needed for chest pain.  . rosuvastatin (CRESTOR) 40 MG tablet Take 1 tablet (40 mg total) by mouth daily.  . sacubitril-valsartan (ENTRESTO) 24-26 MG Take 1 tablet by mouth 2 (two) times daily.  . tamsulosin (FLOMAX) 0.4 MG CAPS capsule Take 1 capsule (0.4 mg total) by mouth daily after supper.  . warfarin (COUMADIN) 10 MG tablet Take 1-2 tablets daily as directed by coumadin clinic     Allergies:   Patient has no known allergies.   Social History   Tobacco Use  . Smoking status: Current Every Day Smoker    Packs/day: 0.50    Types: Cigarettes  . Smokeless tobacco: Never Used  Substance Use Topics  . Alcohol use: Yes    Alcohol/week: 2.0 standard drinks     Types: 2 Cans of beer per week    Comment: 2 Beers daily.  . Drug use: Yes    Frequency: 3.0 times per week    Types: Marijuana     Family Hx: The patient's family history includes Breast cancer in an other family member; Diabetes type II in his mother; Hypertension in his mother; Leukemia in an other family member; Stroke in his brother and mother. There is no history of CAD.  ROS:   Please see the history of present illness.    All other systems reviewed and are negative.   Prior CV studies:   The following studies were reviewed today: Echocardiogram 11/04/2018 1. Mild dyskinesis of the left ventricular, entire apical segment.  2. Severe hypokinesis of the left ventricular, mid-apical anteroseptal wall and anterior wall.  3. The left ventricle has moderately reduced systolic function, with an ejection fraction  of 35-40%. The cavity size was normal. There is moderate concentric left ventricular hypertrophy. Left ventricular diastolic Doppler parameters are consistent with  impaired relaxation. There is abnormal septal motion consistent with left bundle branch block. No evidence of left ventricular regional wall motion abnormalities.  4. The right ventricle has normal systolic function. The cavity was normal. There is no increase in right ventricular wall thickness. Right ventricular systolic pressure could not be assessed.  5. Sclerosis without any evidence of stenosis of the aortic valve. Aortic valve regurgitation is trivial by color flow Doppler.  6. The aorta is normal unless otherwise noted.  7. The aortic root is normal in size and structure.  8. When compared to the prior study: 08/16/2018, no significant change is seen.  Labs/Other Tests and Data Reviewed:    EKG:  No ECG reviewed.  Recent Labs: 11/11/2018: Magnesium 2.0 11/22/2018: TSH 1.950 02/02/2019: ALT 8; BUN 10; Creatinine, Ser 0.74; Hemoglobin 11.0; Platelets 460; Potassium 4.9; Sodium 139   Recent Lipid Panel Lab  Results  Component Value Date/Time   CHOL 178 02/02/2019 09:06 AM   TRIG 198 (H) 02/02/2019 09:06 AM   HDL 50 02/02/2019 09:06 AM   CHOLHDL 3.6 02/02/2019 09:06 AM   CHOLHDL 3.2 11/04/2018 03:54 AM   LDLCALC 94 02/02/2019 09:06 AM    Wt Readings from Last 3 Encounters:  02/23/19 148 lb (67.1 kg)  02/21/19 148 lb 6.4 oz (67.3 kg)  01/31/19 149 lb (67.6 kg)     Objective:    Vital Signs:  Ht 6' (1.829 m)   Wt 148 lb (67.1 kg)   BMI 20.07 kg/m    VITAL SIGNS:  reviewed GEN:  no acute distress NEURO:  alert and oriented x 3, no obvious focal deficit PSYCH:  normal affect  ASSESSMENT & PLAN:    1.  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  New prescription for Coumadin is provided.  He will see pharmacy on February 25, 2019 for dosing management. CBC will be ordered prior to next appointment.   2.  Coronary artery disease: History of STEMI with PCI to the LAD and diagonal branch PTCA in 2019.  He is currently stopping Plavix as directed by Dr. Ellyn Hack to discontinue the end of December).  He denies recurrent chest pain, he continues medically compliant with metoprolol.  He denies angina.   3. Ischemic CM: Most recent echocardiogram 10/2018 revealed EF of 123456, with diastolic dysfunction. Will need to repeat echo on next appointment to evaluated status and for medication management. He denies any fluid retention. He is not currently on diuretic. BMET will be ordered prior to next appointment. Was to see Dr.Camnitiz. No notes of encounter. May need to be re-referred. I will ask to have referral sent today.   4.Hx of CVA:  He denies any weakness or neurologic deficit. Difficult to assess over the phone.    5. Elevated PSA: He is to follow up with Urology in early January 2021.   COVID-19 Education: The signs and symptoms of COVID-19 were discussed with the patient and how to seek care for testing (follow up with PCP or arrange E-visit).  The importance of social distancing was discussed today.  Time:   Today, I have spent 15 minutes with the patient with telehealth technology discussing the above problems.     Medication Adjustments/Labs and Tests Ordered: Current medicines are reviewed at length with the patient today.  Concerns regarding medicines are outlined  above.   Tests Ordered: No orders of the defined types were placed in this encounter.   Medication Changes: No orders of the defined types were placed in this encounter.    Disposition:  Follow up  Dr. Ellyn Hack in 3 months.   Signed, Phill Myron. [place] Pugh, ANP, AACC  02/23/2019 1:58 PM    [facility]

## 2019-02-23 ENCOUNTER — Telehealth (INDEPENDENT_AMBULATORY_CARE_PROVIDER_SITE_OTHER): Payer: Medicaid Other | Admitting: Adult Health

## 2019-02-23 ENCOUNTER — Other Ambulatory Visit: Payer: Self-pay

## 2019-02-23 VITALS — Ht 72.0 in | Wt 148.0 lb

## 2019-02-23 DIAGNOSIS — I255 Ischemic cardiomyopathy: Secondary | ICD-10-CM

## 2019-02-23 DIAGNOSIS — Z79899 Other long term (current) drug therapy: Secondary | ICD-10-CM | POA: Diagnosis not present

## 2019-02-23 DIAGNOSIS — Z720 Tobacco use: Secondary | ICD-10-CM

## 2019-02-23 DIAGNOSIS — Z8673 Personal history of transient ischemic attack (TIA), and cerebral infarction without residual deficits: Secondary | ICD-10-CM

## 2019-02-23 DIAGNOSIS — I2510 Atherosclerotic heart disease of native coronary artery without angina pectoris: Secondary | ICD-10-CM

## 2019-02-23 DIAGNOSIS — R972 Elevated prostate specific antigen [PSA]: Secondary | ICD-10-CM

## 2019-02-23 DIAGNOSIS — I7410 Embolism and thrombosis of unspecified parts of aorta: Secondary | ICD-10-CM

## 2019-02-23 MED ORDER — WARFARIN SODIUM 10 MG PO TABS: ORAL_TABLET | ORAL | 0 refills | Status: DC

## 2019-02-23 MED ORDER — TAMSULOSIN HCL 0.4 MG PO CAPS: 0.4000 mg | ORAL_CAPSULE | Freq: Every day | ORAL | 3 refills | Status: DC

## 2019-02-23 NOTE — Patient Instructions (Signed)
Medication Instructions:  Continue your current medication. *If you need a refill on your cardiac medications before your next appointment, please call your pharmacy*  Lab Work: You will need to get a BMET AND A CBC prior to your 3-4 month appointment.   If you have labs (blood work) drawn today and your tests are completely normal, you will receive your results only by: Marland Kitchen MyChart Message (if you have MyChart) OR . A paper copy in the mail If you have any lab test that is abnormal or we need to change your treatment, we will call you to review the results.  Testing/Procedures: none  Follow-Up: At [facility], you and your health needs are our priority.  As part of our continuing mission to provide you with exceptional heart care, we have created designated Provider Care Teams.  These Care Teams include your primary Cardiologist (physician) and Advanced Practice Providers (APPs -  Physician Assistants and Nurse Practitioners) who all work together to provide you with the care you need, when you need it.  Your next appointment:   3 month(s)  The format for your next appointment:   In Person  Provider:   Glenetta Hew, MD

## 2019-02-23 NOTE — Progress Notes (Signed)
Yes, it seems that way -- :-) wonder why? :-).    I need these peeps to get to know you for days when I am not here.  Glenetta Hew, MD

## 2019-02-23 NOTE — Progress Notes (Signed)
Curt Bears -so sorry I did not update my note after talking we will obtain it.  Basically, since his EF is in the 35 to 40% range, will said that he would not meet criteria for ICD.  Unless he has more issues with arrhythmia, the plan will be to simply manage medically for now.  He would not need a rereferral to [employer].  Glenetta Hew, MD

## 2019-02-23 NOTE — Progress Notes (Signed)
No problem.  I just wanted to make sure I did my best for your patient and followed your plan for him.  Thank you for letting me know.  I am seeing a lot of your patients today!

## 2019-02-25 ENCOUNTER — Other Ambulatory Visit: Payer: Self-pay

## 2019-02-25 ENCOUNTER — Ambulatory Visit (INDEPENDENT_AMBULATORY_CARE_PROVIDER_SITE_OTHER): Payer: Medicaid Other | Admitting: Pharmacist Clinician (PhC)/ Clinical Pharmacy Specialist

## 2019-02-25 DIAGNOSIS — Z7901 Long term (current) use of anticoagulants: Secondary | ICD-10-CM

## 2019-02-25 DIAGNOSIS — Z8673 Personal history of transient ischemic attack (TIA), and cerebral infarction without residual deficits: Secondary | ICD-10-CM

## 2019-02-25 DIAGNOSIS — I639 Cerebral infarction, unspecified: Secondary | ICD-10-CM

## 2019-02-25 LAB — POCT INR: INR: 8 — AB (ref 2.0–3.0)

## 2019-02-25 NOTE — Patient Instructions (Signed)
No warfarin until after Monday appointment

## 2019-02-28 ENCOUNTER — Ambulatory Visit (INDEPENDENT_AMBULATORY_CARE_PROVIDER_SITE_OTHER): Payer: Medicaid Other | Admitting: Pharmacist Clinician (PhC)/ Clinical Pharmacy Specialist

## 2019-02-28 ENCOUNTER — Other Ambulatory Visit: Payer: Self-pay

## 2019-02-28 DIAGNOSIS — Z7901 Long term (current) use of anticoagulants: Secondary | ICD-10-CM

## 2019-02-28 DIAGNOSIS — Z8673 Personal history of transient ischemic attack (TIA), and cerebral infarction without residual deficits: Secondary | ICD-10-CM

## 2019-02-28 LAB — POCT INR: INR: 1.4 — AB (ref 2.0–3.0)

## 2019-03-02 ENCOUNTER — Telehealth: Payer: Self-pay | Admitting: Cardiology

## 2019-03-02 NOTE — Telephone Encounter (Signed)
Ailene Ravel with [facility] called to confirm patient is still taking warfarin. Ailene Ravel reports patient's medicaid card has not been used to fill the prescription since September. Confirmed with Ailene Ravel patient has been coming to the coumadin clinic, informed Ailene Ravel of current coumadin orders. Ailene Ravel has no further questions at this time.

## 2019-03-02 NOTE — Telephone Encounter (Signed)
Pt c/o medication issue:  1. Name of Medication: warfarin (COUMADIN) 10 MG tablet  2. How are you currently taking this medication (dosage and times per day)?   3. Are you having a reaction (difficulty breathing--STAT)? No  4. What is your medication issue? Ailene Ravel from [facility] is calling wanting to know if the patient is supposed to be currently taking warfarin (COUMADIN) 10 MG tablet. He states he takes the medication but it hasn't been filled since September so she was unsure. Please advise.

## 2019-03-10 ENCOUNTER — Other Ambulatory Visit: Payer: Self-pay

## 2019-03-10 ENCOUNTER — Ambulatory Visit (INDEPENDENT_AMBULATORY_CARE_PROVIDER_SITE_OTHER): Payer: Medicaid Other | Admitting: Pharmacist

## 2019-03-10 ENCOUNTER — Telehealth: Payer: Self-pay | Admitting: Pharmacist

## 2019-03-10 DIAGNOSIS — Z7901 Long term (current) use of anticoagulants: Secondary | ICD-10-CM | POA: Diagnosis not present

## 2019-03-10 DIAGNOSIS — Z8673 Personal history of transient ischemic attack (TIA), and cerebral infarction without residual deficits: Secondary | ICD-10-CM

## 2019-03-10 LAB — POCT INR: INR: 3.3 — AB (ref 2.0–3.0)

## 2019-03-10 NOTE — Telephone Encounter (Signed)
Patient reports multiple falls in the last months. Dizziness upon standing or if reach down.  Blood pressure 118/80 with HR 76 while at Clinic.  Recommendation: HOLD metoprolol for 48hrs ,then resume at 12.5mg  every daily.  *Please contact patient with any additional recommendation from DR Ellyn Hack*

## 2019-03-14 NOTE — Telephone Encounter (Signed)
Current recommendation sounds reasonable.  Glenetta Hew, MD

## 2019-03-15 NOTE — Telephone Encounter (Signed)
Left detail message to patient . Dr Ellyn Hack agreed with  Direction that were  given to the patient by the pharmacist. Any question or issue please contact office.

## 2019-03-25 ENCOUNTER — Other Ambulatory Visit: Payer: Self-pay

## 2019-03-25 ENCOUNTER — Telehealth: Payer: Self-pay | Admitting: Pharmacist

## 2019-03-25 ENCOUNTER — Ambulatory Visit (INDEPENDENT_AMBULATORY_CARE_PROVIDER_SITE_OTHER): Payer: Medicaid Other | Admitting: Pharmacist

## 2019-03-25 DIAGNOSIS — Z7901 Long term (current) use of anticoagulants: Secondary | ICD-10-CM | POA: Diagnosis not present

## 2019-03-25 DIAGNOSIS — Z8673 Personal history of transient ischemic attack (TIA), and cerebral infarction without residual deficits: Secondary | ICD-10-CM

## 2019-03-25 LAB — POCT INR: INR: 4.9 — AB (ref 2.0–3.0)

## 2019-03-25 MED ORDER — TAMSULOSIN HCL 0.4 MG PO CAPS: 0.4000 mg | ORAL_CAPSULE | Freq: Every day | ORAL | 3 refills | Status: DC

## 2019-03-25 NOTE — Telephone Encounter (Signed)
Patient requesting handicap sticker and walker. Please call him back if DR Ellyn Hack is able to sign papers for him.

## 2019-03-27 NOTE — Telephone Encounter (Signed)
I will be happy to sign the paperwork when I am in clinic this week.  Glenetta Hew, MD c

## 2019-04-08 ENCOUNTER — Other Ambulatory Visit: Payer: Self-pay

## 2019-04-08 ENCOUNTER — Ambulatory Visit (INDEPENDENT_AMBULATORY_CARE_PROVIDER_SITE_OTHER): Payer: Medicaid Other | Admitting: Pharmacist Clinician (PhC)/ Clinical Pharmacy Specialist

## 2019-04-08 DIAGNOSIS — Z8673 Personal history of transient ischemic attack (TIA), and cerebral infarction without residual deficits: Secondary | ICD-10-CM

## 2019-04-08 DIAGNOSIS — Z7901 Long term (current) use of anticoagulants: Secondary | ICD-10-CM

## 2019-04-08 LAB — POCT INR: INR: 3.3 — AB (ref 2.0–3.0)

## 2019-04-08 NOTE — Patient Instructions (Signed)
Decrease dose to 1 tablet daily except 1/2 tablet each Friday.  Repeat INR in 2 weeks

## 2019-04-11 DIAGNOSIS — Z736 Limitation of activities due to disability: Secondary | ICD-10-CM

## 2019-04-22 ENCOUNTER — Other Ambulatory Visit: Payer: Self-pay

## 2019-04-22 ENCOUNTER — Ambulatory Visit (INDEPENDENT_AMBULATORY_CARE_PROVIDER_SITE_OTHER): Payer: Medicaid Other | Admitting: Pharmacist Clinician (PhC)/ Clinical Pharmacy Specialist

## 2019-04-22 DIAGNOSIS — Z8673 Personal history of transient ischemic attack (TIA), and cerebral infarction without residual deficits: Secondary | ICD-10-CM | POA: Diagnosis not present

## 2019-04-22 DIAGNOSIS — Z7901 Long term (current) use of anticoagulants: Secondary | ICD-10-CM

## 2019-04-22 DIAGNOSIS — I639 Cerebral infarction, unspecified: Secondary | ICD-10-CM | POA: Diagnosis not present

## 2019-04-22 LAB — POCT INR: INR: 4.6 — AB (ref 2.0–3.0)

## 2019-04-22 MED ORDER — WARFARIN SODIUM 5 MG PO TABS: ORAL_TABLET | ORAL | 0 refills | Status: DC

## 2019-04-26 NOTE — Telephone Encounter (Signed)
We just signed the form today.  Glenetta Hew, MD

## 2019-04-30 ENCOUNTER — Other Ambulatory Visit: Payer: Self-pay | Admitting: Urology

## 2019-05-06 ENCOUNTER — Other Ambulatory Visit: Payer: Self-pay

## 2019-05-06 ENCOUNTER — Ambulatory Visit (INDEPENDENT_AMBULATORY_CARE_PROVIDER_SITE_OTHER): Payer: Medicaid Other | Admitting: Pharmacist

## 2019-05-06 DIAGNOSIS — Z7901 Long term (current) use of anticoagulants: Secondary | ICD-10-CM

## 2019-05-06 DIAGNOSIS — Z8673 Personal history of transient ischemic attack (TIA), and cerebral infarction without residual deficits: Secondary | ICD-10-CM

## 2019-05-06 LAB — POCT INR: INR: 3.2 — AB (ref 2.0–3.0)

## 2019-05-18 ENCOUNTER — Ambulatory Visit (INDEPENDENT_AMBULATORY_CARE_PROVIDER_SITE_OTHER): Payer: Medicaid Other | Admitting: Pharmacist

## 2019-05-18 ENCOUNTER — Other Ambulatory Visit: Payer: Self-pay

## 2019-05-18 DIAGNOSIS — Z7901 Long term (current) use of anticoagulants: Secondary | ICD-10-CM | POA: Diagnosis not present

## 2019-05-18 DIAGNOSIS — Z8673 Personal history of transient ischemic attack (TIA), and cerebral infarction without residual deficits: Secondary | ICD-10-CM

## 2019-05-18 LAB — POCT INR: INR: 1.7 — AB (ref 2.0–3.0)

## 2019-05-20 ENCOUNTER — Telehealth: Payer: Self-pay | Admitting: Family Medicine

## 2019-05-20 NOTE — Telephone Encounter (Signed)
Pt was called and reminded of there appointment 

## 2019-05-23 ENCOUNTER — Ambulatory Visit (INDEPENDENT_AMBULATORY_CARE_PROVIDER_SITE_OTHER): Payer: Medicaid Other | Admitting: Family Medicine

## 2019-05-23 ENCOUNTER — Encounter: Payer: Self-pay | Admitting: Family Medicine

## 2019-05-23 ENCOUNTER — Other Ambulatory Visit: Payer: Self-pay

## 2019-05-23 VITALS — BP 115/74 | HR 78 | Temp 97.3°F | Ht 72.0 in | Wt 140.8 lb

## 2019-05-23 DIAGNOSIS — N400 Enlarged prostate without lower urinary tract symptoms: Secondary | ICD-10-CM

## 2019-05-23 DIAGNOSIS — R972 Elevated prostate specific antigen [PSA]: Secondary | ICD-10-CM | POA: Diagnosis not present

## 2019-05-23 DIAGNOSIS — Z720 Tobacco use: Secondary | ICD-10-CM

## 2019-05-23 DIAGNOSIS — Z0000 Encounter for general adult medical examination without abnormal findings: Secondary | ICD-10-CM

## 2019-05-23 DIAGNOSIS — I252 Old myocardial infarction: Secondary | ICD-10-CM

## 2019-05-23 DIAGNOSIS — Z09 Encounter for follow-up examination after completed treatment for conditions other than malignant neoplasm: Secondary | ICD-10-CM

## 2019-05-23 DIAGNOSIS — I10 Essential (primary) hypertension: Secondary | ICD-10-CM

## 2019-05-23 LAB — POCT URINALYSIS DIPSTICK
Glucose, UA: NEGATIVE
Ketones, UA: NEGATIVE
Leukocytes, UA: NEGATIVE
Nitrite, UA: NEGATIVE
Protein, UA: POSITIVE — AB
Spec Grav, UA: >=1.03 — AB
Urobilinogen, UA: 1 U/dL
pH, UA: 5.5

## 2019-05-23 LAB — POCT GLYCOSYLATED HEMOGLOBIN (HGB A1C): Hemoglobin A1C: 5.2 % (ref 4.0–5.6)

## 2019-05-23 LAB — GLUCOSE, POCT (MANUAL RESULT ENTRY): POC Glucose: 117 mg/dl — AB (ref 70–99)

## 2019-05-23 MED ORDER — ALBUTEROL SULFATE HFA 108 (90 BASE) MCG/ACT IN AERS: 2.0000 | INHALATION_SPRAY | Freq: Four times a day (QID) | RESPIRATORY_TRACT | 11 refills | Status: DC | PRN

## 2019-05-23 NOTE — Progress Notes (Signed)
Patient [facility]   Established Patient Office Visit  Subjective:  Patient ID: Daniel Weiss, male    DOB: Dec 04, 1960  Age: 59 y.o. MRN: [id number]  CC:  Chief Complaint  Patient presents with  . Follow-up    HTN    HPI Daniel Weiss Is a 59 year old male which presents for Follow Up today.   Past Medical History:  Diagnosis Date  . Abnormal chest CT 02/2018   a. Ectatic 3 cm Ao arch - rec f/u outpt imaging w/ CTA or MRA. Ectatic atheromatous abd Ao @ risk for aneurysm - rec f/u u/s in 5 yrs. Marked prostatic enlargement w/ scattered small sclerotic foci in the thoracic lower lumbar spine, sacrum, left 12th rib, pelvis, and right femur.  Recommend elective outpatient whole-body bone scintigraphy and PSA.  Marland Kitchen CAD (coronary artery disease)    a. 02/2018 ACS/PCI: LM nl, LAD 85p (3.5x15 Sierra DES), D1 45ost, RI 55ost, RCA nl, EF 25-35%.  . Complete left bundle branch block (LBBB) 2016  . Current every day smoker   . Elevated PSA 11/2018  . Enlarged prostate   . HFrEF (heart failure with reduced ejection fraction) ([place])   . Hypertension   . Ischemic cardiomyopathy 02/2018   a. 02/2018 Echo: EF 35-40% (LV gram on cath was 25-30%), mod, diff HK. mid-apicalanteroseptal, ant, and apical sev HK. RVH.-->  No notable change on echo from June 2020 and August 2020.  Marland Kitchen Myocardial infarction ([place])   . NSTEMI (non-ST elevated myocardial infarction) ([place]) 02/2018  . Stroke ([place])   . TIA (transient ischemic attack) 2016  . Warthin's tumor    Current Status: Since his last office visit, he is doing well with no complaints. He continues to follow up with Cardiology and Urology. He denies visual changes, chest pain, cough, shortness of breath, heart palpitations, and falls. He has occasional headaches and dizziness with position changes. Denies severe headaches, confusion, seizures, double vision, and blurred vision, nausea and vomiting. He denies fevers,  chills, fatigue, recent infections, weight loss, and night sweats. No reports of GI problems such as diarrhea, and constipation. He has no reports of blood in stools, dysuria and hematuria. No depression or anxiety reported today. He denies suicidal ideations, homicidal ideations, or auditory hallucinations. He denies pain today.   Past Surgical History:  Procedure Laterality Date  . CORONARY STENT INTERVENTION N/A 03/06/2018   Procedure: CORONARY STENT INTERVENTION;  Surgeon: [name], MD;  Location: [place] [facility];  Service: Cardiovascular: Proximal LAD 85% stenosis (and D1): DES PCI with Xience Sierra DES 3.5 mm x 15 mm - 3.9 mm  . INTRAOPERATIVE TRANSTHORACIC ECHOCARDIOGRAM  03/06/2018   (Peri-MI) mild reduced EF 35-40%.  Diffuse hypokinesis (severe HK of the mid-apical anteroseptal, anterior and apical myocardium consistent with LAD infarct).  Unable to assess diastolic function.  Paradoxical septal motion likely related to his LBBB.  RV hypertrophy noted.  Trivial pericardial effusion noted.   Marland Kitchen LEFT HEART CATH AND CORONARY ANGIOGRAPHY N/A 03/06/2018   Procedure: LEFT HEART CATH AND CORONARY ANGIOGRAPHY;  Surgeon: [name], MD;  Location: [facility];  Service: Cardiovascular: Proximal LAD 85% involving D1 45%.  Ostial RI 55%.  Severe LV dysfunction EF 25 to 30%.  1+ MR.  Only minimally elevated LVEDP.  Marland Kitchen TRANSTHORACIC ECHOCARDIOGRAM  10/2018   (Most recent echo, to evaluate for TIA/CVA) : Moderate reduced EF 35 to 40%.  Moderate concentric LVH.  Mild dyskinesis of the apex and severe hypokinesis of mid apical anteroseptal and anterior wall.  Unable to assess RV pressures.  Aortic sclerosis with no stenosis.  No significant change seen    Family History  Problem Relation Age of Onset  . Stroke Mother   . Hypertension Mother   . Diabetes type II Mother   . Leukemia Other   . Breast cancer Other   . Stroke Brother   . CAD Neg Hx     Social History    Socioeconomic History  . Marital status: Married    Spouse name: Dorothenia  . Number of children: Not on file  . Years of education: Not on file  . Highest education level: Not on file  Occupational History  . Occupation: cook  Tobacco Use  . Smoking status: Current Some Day Smoker    Packs/day: 0.50    Types: Cigarettes  . Smokeless tobacco: Never Used  Substance and Sexual Activity  . Alcohol use: Yes    Alcohol/week: 2.0 standard drinks    Types: 2 Cans of beer per week    Comment: 2 Beers daily.  . Drug use: Yes    Frequency: 3.0 times per week    Types: Marijuana  . Sexual activity: Not Currently  Other Topics Concern  . Not on file  Social History Narrative  . Not on file   Social Determinants of Health   Financial Resource Strain:   . Difficulty of Paying Living Expenses:   Food Insecurity:   . Worried About Charity fundraiser in the Last Year:   . Arboriculturist in the Last Year:   Transportation Needs: No Transportation Needs  . Lack of Transportation (Medical): No  . Lack of Transportation (Non-Medical): No  Physical Activity:   . Days of Exercise per Week:   . Minutes of Exercise per Session:   Stress:   . Feeling of Stress :   Social Connections:   . Frequency of Communication with Friends and Family:   . Frequency of Social Gatherings with Friends and Family:   . Attends Religious Services:   . Active Member of Clubs or Organizations:   . Attends Archivist Meetings:   Marland Kitchen Marital Status:   Intimate Partner Violence:   . Fear of Current or Ex-Partner:   . Emotionally Abused:   Marland Kitchen Physically Abused:   . Sexually Abused:     Outpatient Medications Prior to Visit  Medication Sig Dispense Refill  . bicalutamide (CASODEX) 50 MG tablet Take 1 tablet by mouth twice daily 60 tablet 0  . metoprolol succinate (TOPROL XL) 25 MG 24 hr tablet Take 0.5 tablets (12.5 mg total) by mouth daily. 45 tablet 3  . nitroGLYCERIN (NITROSTAT) 0.4 MG SL  tablet Place 1 tablet (0.4 mg total) under the tongue every 5 (five) minutes as needed for chest pain. 25 tablet 3  . sacubitril-valsartan (ENTRESTO) 24-26 MG Take 1 tablet by mouth 2 (two) times daily. 60 tablet 8  . warfarin (COUMADIN) 5 MG tablet Take 1-2 tablets by mouth daily as directed by coumadin clinic 180 tablet 0  . rosuvastatin (CRESTOR) 40 MG tablet Take 1 tablet (40 mg total) by mouth daily. 30 tablet 6  . tamsulosin (FLOMAX) 0.4 MG CAPS capsule Take 1 capsule (0.4 mg total) by mouth daily after supper. 90 capsule 3   No facility-administered medications prior to visit.    No Known Allergies  ROS Review of Systems  Constitutional:  Negative.   HENT: Negative.   Eyes: Negative.   Respiratory: Negative.   Cardiovascular: Negative.   Gastrointestinal: Negative.   Endocrine: Negative.   Genitourinary: Negative.   Musculoskeletal: Negative.   Skin: Negative.   Allergic/Immunologic: Negative.   Neurological: Positive for dizziness (occasional ) and headaches (occasional ).  Hematological: Negative.   Psychiatric/Behavioral: Negative.       Objective:    Physical Exam  Constitutional: He is oriented to person, place, and time. He appears well-developed and well-nourished.  HENT:  Head: Normocephalic and atraumatic.  Eyes: Conjunctivae are normal.  Cardiovascular: Normal rate, regular rhythm, normal heart sounds and intact distal pulses.  Pulmonary/Chest: Effort normal and breath sounds normal.  Abdominal: Soft. Bowel sounds are normal.  Musculoskeletal:        General: Normal range of motion.     Cervical back: Normal range of motion and neck supple.  Neurological: He is alert and oriented to person, place, and time. He has normal reflexes.  Skin: Skin is warm.  Psychiatric: He has a normal mood and affect. His behavior is normal. Judgment and thought content normal.    BP 115/74   Pulse 78   Temp (!) 97.3 F (36.3 C) (Axillary)   Ht 6' (1.829 m)   Wt 140 lb  12.8 oz (63.9 kg)   SpO2 100%   BMI 19.10 kg/m  Wt Readings from Last 3 Encounters:  05/23/19 140 lb 12.8 oz (63.9 kg)  02/23/19 148 lb (67.1 kg)  02/21/19 148 lb 6.4 oz (67.3 kg)     Health Maintenance Due  Topic Date Due  . Hepatitis C Screening  Never done  . TETANUS/TDAP  Never done  . COLONOSCOPY  Never done  . INFLUENZA VACCINE  Never done    There are no preventive care reminders to display for this patient.  Lab Results  Component Value Date   TSH 1.950 11/22/2018   Lab Results  Component Value Date   WBC 6.4 02/02/2019   HGB 11.0 (L) 02/02/2019   HCT 35.2 (L) 02/02/2019   MCV 76 (L) 02/02/2019   PLT 460 (H) 02/02/2019   Lab Results  Component Value Date   NA 139 02/02/2019   K 4.9 02/02/2019   CO2 23 02/02/2019   GLUCOSE 70 02/02/2019   BUN 10 02/02/2019   CREATININE 0.74 (L) 02/02/2019   BILITOT 0.3 02/02/2019   ALKPHOS 65 02/02/2019   AST 17 02/02/2019   ALT 8 02/02/2019   PROT 7.0 02/02/2019   ALBUMIN 4.2 02/02/2019   CALCIUM 9.4 02/02/2019   ANIONGAP 12 11/11/2018   Lab Results  Component Value Date   CHOL 178 02/02/2019   Lab Results  Component Value Date   HDL 50 02/02/2019   Lab Results  Component Value Date   LDLCALC 94 02/02/2019   Lab Results  Component Value Date   TRIG 198 (H) 02/02/2019   Lab Results  Component Value Date   CHOLHDL 3.6 02/02/2019   Lab Results  Component Value Date   HGBA1C 5.2 05/23/2019      Assessment & Plan:   1. History of non-ST elevation myocardial infarction (NSTEMI) No signs or symptoms of recurrence noted.   2. Essential hypertension The current medical regimen is effective; blood pressure is stable at 115/74 today; continue present plan and medications as prescribed. He will continue to take medications as prescribed, to decrease high sodium intake, excessive alcohol intake, increase potassium intake, smoking cessation, and increase physical activity of  at least 30 minutes of cardio  activity daily. He will continue to follow Heart Healthy or DASH diet.  3. Elevated PSA Most recent PSA at 387.0. he will continue to follow up with Urology.   4. Enlarged prostate  5. Tobacco use  6. Health care maintenance - POCT glycosylated hemoglobin (Hb A1C) - POCT urinalysis dipstick - POCT glucose (manual entry)  7. Follow up He will follow up in 6 months.   Meds ordered this encounter  Medications  . albuterol (VENTOLIN HFA) 108 (90 Base) MCG/ACT inhaler    Sig: Inhale 2 puffs into the lungs every 6 (six) hours as needed for wheezing or shortness of breath.    Dispense:  8 g    Refill:  11    Orders Placed This Encounter  Procedures  . POCT glycosylated hemoglobin (Hb A1C)  . POCT urinalysis dipstick  . POCT glucose (manual entry)    Referral Orders  No referral(s) requested today    Kathe Becton,  MSN, FNP-BC [address], [place] [phone] fax   Problem List Items Addressed This Visit      Cardiovascular and Mediastinum   Essential hypertension (Chronic)     Other   Elevated PSA   Enlarged prostate   History of non-ST elevation myocardial infarction (NSTEMI) - Primary    Other Visit Diagnoses    Tobacco use       Health care maintenance       Relevant Orders   POCT glycosylated hemoglobin (Hb A1C) (Completed)   POCT urinalysis dipstick (Completed)   POCT glucose (manual entry) (Completed)   Follow up          Meds ordered this encounter  Medications  . albuterol (VENTOLIN HFA) 108 (90 Base) MCG/ACT inhaler    Sig: Inhale 2 puffs into the lungs every 6 (six) hours as needed for wheezing or shortness of breath.    Dispense:  8 g    Refill:  11    Follow-up: Return in about 6 months (around 11/23/2019).    Azzie Glatter, FNP

## 2019-05-30 ENCOUNTER — Ambulatory Visit (INDEPENDENT_AMBULATORY_CARE_PROVIDER_SITE_OTHER): Payer: Medicaid Other | Admitting: Cardiology

## 2019-05-30 ENCOUNTER — Encounter: Payer: Self-pay | Admitting: Cardiology

## 2019-05-30 ENCOUNTER — Ambulatory Visit (INDEPENDENT_AMBULATORY_CARE_PROVIDER_SITE_OTHER): Payer: Medicaid Other | Admitting: Pharmacist

## 2019-05-30 ENCOUNTER — Other Ambulatory Visit: Payer: Self-pay

## 2019-05-30 VITALS — BP 118/78 | HR 64 | Temp 96.5°F | Ht 72.0 in | Wt 138.8 lb

## 2019-05-30 DIAGNOSIS — R0602 Shortness of breath: Secondary | ICD-10-CM

## 2019-05-30 DIAGNOSIS — Z955 Presence of coronary angioplasty implant and graft: Secondary | ICD-10-CM

## 2019-05-30 DIAGNOSIS — I7410 Embolism and thrombosis of unspecified parts of aorta: Secondary | ICD-10-CM | POA: Diagnosis not present

## 2019-05-30 DIAGNOSIS — I255 Ischemic cardiomyopathy: Secondary | ICD-10-CM

## 2019-05-30 DIAGNOSIS — E785 Hyperlipidemia, unspecified: Secondary | ICD-10-CM | POA: Diagnosis not present

## 2019-05-30 DIAGNOSIS — Z7901 Long term (current) use of anticoagulants: Secondary | ICD-10-CM

## 2019-05-30 DIAGNOSIS — I25119 Atherosclerotic heart disease of native coronary artery with unspecified angina pectoris: Secondary | ICD-10-CM | POA: Diagnosis not present

## 2019-05-30 DIAGNOSIS — E860 Dehydration: Secondary | ICD-10-CM

## 2019-05-30 DIAGNOSIS — I252 Old myocardial infarction: Secondary | ICD-10-CM

## 2019-05-30 DIAGNOSIS — Z8673 Personal history of transient ischemic attack (TIA), and cerebral infarction without residual deficits: Secondary | ICD-10-CM

## 2019-05-30 DIAGNOSIS — Z720 Tobacco use: Secondary | ICD-10-CM

## 2019-05-30 LAB — POCT INR: INR: 1.8 — AB (ref 2.0–3.0)

## 2019-05-30 MED ORDER — ENTRESTO 24-26 MG PO TABS: ORAL_TABLET | ORAL | 8 refills | Status: DC

## 2019-05-30 MED ORDER — FUROSEMIDE 20 MG PO TABS: ORAL_TABLET | ORAL | 6 refills | Status: DC

## 2019-05-30 NOTE — Patient Instructions (Addendum)
Medication Instructions:   continue with medication except  Entresto take 1/2 tablet in the morning and one whole tablet in the evening  If after 2 weeks you still are having dizziness  Reduce the evening dose to 1/2 tablet as well  Lasix ( furosemide) 20 mg tablet  may take if  Increase shortness of breathe or swelling.   *If you need a refill on your cardiac medications before your next appointment, please call your pharmacy*   Lab Work :cmp lipid  If you have labs (blood work) drawn today and your tests are completely normal, you will receive your results only by: Marland Kitchen MyChart Message (if you have MyChart) OR . A paper copy in the mail If you have any lab test that is abnormal or we need to change your treatment, we will call you to review the results.   Testing/Procedures: WILL BE SCHEDULE AT [facility]  - Aug 2021   Follow-Up: At [facility], you and your health needs are our priority.  As part of our continuing mission to provide you with exceptional heart care, we have created designated Provider Care Teams.  These Care Teams include your primary Cardiologist (physician) and Advanced Practice Providers (APPs -  Physician Assistants and Nurse Practitioners) who all work together to provide you with the care you need, when you need it.  We recommend signing up for the patient portal called "MyChart".  Sign up information is provided on this After Visit Summary.  MyChart is used to connect with patients for Virtual Visits (Telemedicine).  Patients are able to view lab/test results, encounter notes, upcoming appointments, etc.  Non-urgent messages can be sent to your provider as well.   To learn more about what you can do with MyChart, go to [email or web address].    Your next appointment:    5  Months- Aug or Sept 2021 month(s)  The format for your next appointment:   In Person  Provider:   Glenetta Hew, MD   Other  Instructions

## 2019-05-30 NOTE — Progress Notes (Signed)
Primary Care Provider: Azzie Glatter, FNP Cardiologist: Glenetta Hew, MD Electrophysiologist:   Clinic Note: Chief Complaint  Patient presents with  . Follow-up    Still notes lightheadedness, dizziness and fatigue.  . Coronary Artery Disease  . Cardiomyopathy    No real heart failure symptoms.    HPI:    Daniel Weiss is a 59 y.o. male with a PMH notable for anterior STEMI-PCI to LAD with resultant ICM (EF stable at 35 to 40%) and LEFT BUNDLE [place] with recent CVA (10/2018 -> documented thoracic aortic thrombus)) who presents today for 82-month follow-up. -> He was also recently diagnosed with metastatic prostate cancer at the time of his CVA.  Daniel Weiss was last seen on November 29, 2018 in hospital follow-up after his stroke with concern for aortic atheroma.  Was converted to Plavix and warfarin. -> We converted from ACE inhibitor to low-dose Entresto and cut down carvedilol dose--unable to be titrated up due to borderline hypotension.  Plan was for him to be seen by electrophysiology.  However on the day of his visit, Dr. Curt Bears reviewed the chart and felt that he would not meet criteria at the time.  Unfortunately, there was no note with explanation.  Recent Hospitalizations: None  Reviewed  CV studies:    The following studies were reviewed today: (if available, images/films reviewed: From Epic Chart or Care Everywhere) . No new studies . CTA Chest - No TAA or dissection.  NO atheroma. Mild calcifiction/atherosclerosis.   Interval History:   Daniel Weiss returns here today a little less positive minded than he was when I saw him last time.  He does seem to be doing okay from a generalized cardiac standpoint, but he has been having a lot of what sounds like orthostatic lightheadedness and dizziness.  No syncope or near syncope.  He is not really noticing any significant residual TIA/stroke symptoms.  He does seem to be a little bit if not depressed, at  least dysthymic with dealing with all the healthcare issues.  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  He has had an MI followed by a stroke subsequently diagnosed with having prostate cancer that is metastatic (currently now on immunotherapy with Casodex).  He has lost a significant mental weight, has less energy.  He says now he is trying to eat but just does not have a great appetite.  He denies any PND, orthopnea or edema.  He has lightheadedness and dizziness associated with standing up quickly, but has not had a syncope near syncope.  He has not been doing as well as he knows he should with hydration, and is just not wanting to eat very much. One of the reasons he indicates that he has not been doing a lot of that he lives in a location [place] where he is fearful of going out doing things on his own because of concerns for Covid.  CV Review of Symptoms (Summary) positive for - dyspnea on exertion and Easily fatigued.  Lightheaded and dizzy but no syncope. negative for - chest pain, edema, irregular heartbeat, loss of consciousness, orthopnea, palpitations, paroxysmal nocturnal dyspnea, rapid heart rate, shortness of breath or Syncope/near syncope, TIA/amaurosis fugax, claudication.  The patient DOES NOT have symptoms concerning for COVID-19 infection (fever, chills, cough, or new shortness of breath).  The patient is practicing  social distancing. ++ Masking.  Goes out occasionally for groceries/shopping usually either he or his wife goes in and the other stays in the car..  Not back at work   REVIEWED OF SYSTEMS   A comprehensive ROS was performed. Review of Systems  Constitutional: Positive for malaise/fatigue (Gradually building up his energy level.). Negative for weight loss (Stabilized).  HENT: Negative for congestion and nosebleeds.   Eyes: Negative for pain and  discharge.  Respiratory: Positive for cough (Morning nonproductive cough.). Negative for shortness of breath (Baseline).   Cardiovascular: Negative for claudication and leg swelling.  Gastrointestinal: Negative for blood in stool and melena.  Genitourinary: Negative for frequency and hematuria.  Musculoskeletal: Negative for falls and joint pain.  Neurological: Positive for dizziness (Positional mostly, orthostatic) and headaches. Negative for weakness (Still somewhat weak but getting stronger).  Endo/Heme/Allergies: Negative for environmental allergies. Does not bruise/bleed easily.  Psychiatric/Behavioral: Negative for depression and memory loss. The patient is nervous/anxious (Anxiety level is improving) and has insomnia.        Still coming to grips with diagnosis of stroke, cancer and CAD.    -Morning cough some shortness of breath with exertion.  Off-and-on abdominal pain, headaches.  Nervous and anxious about new diagnosis of cancer.  I have reviewed and (if needed) personally updated the patient's problem list, medications, allergies, past medical and surgical history, social and family history.   PAST MEDICAL HISTORY   Past Medical History:  Diagnosis Date  . Abnormal chest CT 02/2018   a. Ectatic 3 cm Ao arch - rec f/u outpt imaging w/ CTA or MRA. Ectatic atheromatous abd Ao @ risk for aneurysm - rec f/u u/s in 5 yrs. Marked prostatic enlargement w/ scattered small sclerotic foci in the thoracic lower lumbar spine, sacrum, left 12th rib, pelvis, and right femur.  Recommend elective outpatient whole-body bone scintigraphy and PSA.  Marland Kitchen CAD (coronary artery disease)    a. 02/2018 ACS/PCI: LM nl, LAD 85p (3.5x15 Sierra DES), D1 45ost, RI 55ost, RCA nl, EF 25-35%.  . Complete left bundle branch block (LBBB) 2016  . Current every day smoker   . Elevated PSA 11/2018  . Enlarged prostate   . HFrEF (heart failure with reduced ejection fraction) ([place])   . Hypertension   . Ischemic  cardiomyopathy 02/2018   a. 02/2018 Echo: EF 35-40% (LV gram on cath was 25-30%), mod, diff HK. mid-apicalanteroseptal, ant, and apical sev HK. RVH.-->  No notable change on echo from June 2020 and August 2020.  Marland Kitchen Myocardial infarction ([place])   . NSTEMI (non-ST elevated myocardial infarction) ([place]) 02/2018  . Stroke ([place])   . TIA (transient ischemic attack) 2016  . Warthin's tumor     PAST SURGICAL HISTORY   Past Surgical History:  Procedure Laterality Date  . CORONARY STENT INTERVENTION N/A 03/06/2018   Procedure: CORONARY STENT INTERVENTION;  Surgeon: [name], MD;  Location: [facility];  Service: Cardiovascular: Proximal LAD 85% stenosis (and D1): DES PCI with Xience Sierra DES 3.5 mm x 15 mm - 3.9 mm  . INTRAOPERATIVE TRANSTHORACIC ECHOCARDIOGRAM  03/06/2018   (Peri-MI) mild reduced EF 35-40%.  Diffuse hypokinesis (severe HK of the mid-apical anteroseptal, anterior and apical myocardium consistent with LAD infarct).  Unable to assess diastolic function.  Paradoxical septal motion likely related to his LBBB.  RV hypertrophy noted.  Trivial pericardial effusion noted.   Marland Kitchen LEFT HEART CATH AND CORONARY ANGIOGRAPHY N/A 03/06/2018   Procedure: LEFT HEART CATH AND CORONARY ANGIOGRAPHY;  Surgeon: [name], MD;  Location: [facility];  Service: Cardiovascular: Proximal LAD 85% involving D1 45%.  Ostial RI 55%.  Severe LV dysfunction EF 25 to 30%.  1+ MR.  Only minimally elevated LVEDP.  Marland Kitchen TRANSTHORACIC ECHOCARDIOGRAM  10/2018   (Most recent echo, to evaluate for TIA/CVA) : Moderate reduced EF 35 to 40%.  Moderate concentric LVH.  Mild dyskinesis of the apex and severe hypokinesis of mid apical anteroseptal and anterior wall.  Unable to assess RV pressures.  Aortic sclerosis with no stenosis.  No significant change seen    Cardiac Cath-PCI 03/06/2018: Proximal LAD 85% stenosis involving D1(DES PCI with Xience Sierra 3.5 mm x 15 mm-3.9 mm). Ostial RI 55%. Severe LV  dysfunction with EF of 25-35%. 1+ MR. Minimally elevated LVEDP.Marland Kitchen    MEDICATIONS/ALLERGIES   Current Meds  Medication Sig  . albuterol (VENTOLIN HFA) 108 (90 Base) MCG/ACT inhaler Inhale 2 puffs into the lungs every 6 (six) hours as needed for wheezing or shortness of breath.  . bicalutamide (CASODEX) 50 MG tablet Take 1 tablet by mouth twice daily  . metoprolol succinate (TOPROL XL) 25 MG 24 hr tablet Take 0.5 tablets (12.5 mg total) by mouth daily.  . nitroGLYCERIN (NITROSTAT) 0.4 MG SL tablet Place 1 tablet (0.4 mg total) under the tongue every 5 (five) minutes as needed for chest pain.  . sacubitril-valsartan (ENTRESTO) 24-26 MG Take 1/2 tablet in the morning and 1 tablet in the evening  . tamsulosin (FLOMAX) 0.4 MG CAPS capsule Take 1 capsule (0.4 mg total) by mouth daily after supper.  . warfarin (COUMADIN) 5 MG tablet Take 1-2 tablets by mouth daily as directed by coumadin clinic  . [DISCONTINUED] sacubitril-valsartan (ENTRESTO) 24-26 MG Take 1 tablet by mouth 2 (two) times daily.    No Known Allergies   SOCIAL HISTORY/FAMILY HISTORY   Social History   Tobacco Use  . Smoking status: Current Some Day Smoker    Packs/day: 0.50    Types: Cigarettes  . Smokeless tobacco: Never Used  Substance Use Topics  . Alcohol use: Yes    Alcohol/week: 2.0 standard drinks    Types: 2 Cans of beer per week    Comment: 2 Beers daily.  . Drug use: Yes    Frequency: 3.0 times per week    Types: Marijuana   Social History   Social History Narrative  . Not on file    Family History family history includes Breast cancer in an other family member; Diabetes type II in his mother; Hypertension in his mother; Leukemia in an other family member; Stroke in his brother and mother.   OBJCTIVE -PE, EKG, labs   Wt Readings from Last 3 Encounters:  05/30/19 138 lb 12.8 oz (63 kg)  05/23/19 140 lb 12.8 oz (63.9 kg)  02/23/19 148 lb (67.1 kg)    Physical Exam: BP 118/78   Pulse 64    Temp (!) 96.5 F (35.8 C)   Ht 6' (1.829 m)   Wt 138 lb 12.8 oz (63 kg)   SpO2 99%   BMI 18.82 kg/m  Physical Exam  Constitutional: He appears well-developed and well-nourished. No distress.  Looks a little physically stronger.  Healthier-appearing overall.  Well-groomed  HENT:  Head: Normocephalic and atraumatic.  Neck: No JVD present. Carotid bruit is not present (No left sided bruit, but difficult to assess right because of mass).  Right side mass stable.  Cardiovascular: Normal rate, regular rhythm and intact distal pulses.  Occasional extrasystoles are present. PMI is not displaced. Exam reveals gallop, S4 and a midsystolic click. Exam reveals no decreased pulses.  Murmur heard. Pulmonary/Chest: Effort normal and breath sounds normal. No respiratory distress. He has no wheezes. He has no rales.  Abdominal: Soft. Bowel sounds are normal. He exhibits no distension and no mass. There is no abdominal tenderness. There is no rebound and no guarding.  Musculoskeletal:     Cervical back: Normal range of motion and neck supple.  Vitals reviewed.  Adult ECG Report  Rate: 71 ;  Rhythm: normal sinus rhythm and LVH with LBBB features of IVCD.  Inferior MI, age undetermined.  Cannot exclude ischemia with T wave inversions.;   Narrative Interpretation: Stable EKG  Recent Labs: On PCP labs, calculated LDL 67 Lab Results  Component Value Date   CHOL 178 02/02/2019   HDL 50 02/02/2019   LDLCALC 94 02/02/2019   TRIG 198 (H) 02/02/2019   CHOLHDL 3.6 02/02/2019  He recently restarted his statin Lab Results  Component Value Date   CREATININE 0.74 (L) 02/02/2019   BUN 10 02/02/2019   NA 139 02/02/2019   K 4.9 02/02/2019   CL 103 02/02/2019   CO2 23 02/02/2019    ASSESSMENT/PLAN    Problem List Items Addressed This Visit    Coronary artery disease involving native coronary artery of native heart with angina pectoris (HCC) (Chronic)    Significant two-vessel CAD with PCI of the LAD and  PTCA of the diagonal branch.  Initially EF is significantly reduced now better.  Is not having any further anginal symptoms.  Does not have a lot of blood pressure room to treat.  Plan:  Continue current dose of Toprol and Entresto along with rosuvastatin.  Not on antiplatelet agent because of being on warfarin for aortic thrombus.      Relevant Medications   furosemide (LASIX) 20 MG tablet   sacubitril-valsartan (ENTRESTO) 24-26 MG   Other Relevant Orders   EKG 12-Lead (Completed)   Lipid panel   Comprehensive metabolic panel   Ischemic cardiomyopathy (Chronic)    EF improved 35-40% after PCI and medical management.  Not in the threshold for ICD.  With him having lots of orthostatic dizziness and fatigue, and on the people he can tolerate current dose of Entresto.  Plan:   Continue current dose of Toprol, but reduce morning dose of Entresto to one half tab.  If he continues to have lightheadedness and dizziness after 2 weeks, will reduce to 1/2 tablet twice daily.  Encourage increase hydration.  Seems euvolemic.  No requirement for diuretic.      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  Plan: Check coronary CT angiogram to reassess for evidence of thrombus.  If no longer present, will switch from warfarin to Plavix.  Can check his lipid panel and chemistry panel as part of his pretest labs.      Relevant Medications   furosemide (LASIX) 20 MG tablet   sacubitril-valsartan (ENTRESTO) 24-26 MG   Other Relevant Orders   EKG 12-Lead (Completed)   Lipid panel   Comprehensive metabolic panel  CT ANGIO CHEST AORTA W/CM & OR WO/CM    Tobacco abuse (Chronic)    We talked a little bit of smoking cessation, but he was not interested to discuss too much.  He agrees to try to cut back.      Hyperlipidemia LDL goal <70 (Chronic)    Most recent lipids show LDL of 94.  He is on 40 of rosuvastatin.  In light of the fact that he really does not want take extra medications, will hold off on further management now and reassess.  At the time of labs checked in November.  He should be due for labs to be checked soon.-Can get his labs done prior to his CT angio chest.      Relevant Medications   furosemide (LASIX) 20 MG tablet   sacubitril-valsartan (ENTRESTO) 24-26 MG   Other Relevant Orders   Lipid panel   Comprehensive metabolic panel   Presence of drug coated stent in LAD coronary artery (Chronic)    No longer on aspirin or Brilinta.  Is on warfarin.      Dehydration    I am sure this has some role in his dizziness, encouraged increase hydration.      Long term (current) use of anticoagulants    Continue warfarin.  We need to reassess if there is any evidence of residual aortic thrombus 1 year out.  This would be in July August timeframe. Plan to recheck CT angiogram of the chest, if thrombus is gone, will stop warfarin and likely restart Plavix.       History of non-ST elevation myocardial infarction (NSTEMI)    Other Visit Diagnoses    Shortness of breath       Relevant Orders   CT ANGIO CHEST AORTA W/CM & OR WO/CM     I think with everything else going on, he is reluctant to actually fully quit smoking.  This is 1 "pleasure".  COVID-19 Education: The signs and symptoms of COVID-19 were discussed with the patient and how to seek care for testing (follow up with PCP or arrange E-visit).   The importance of social distancing was discussed today.  I spent a total of 23 minutes with the patient and chart review. >  50% of the time was spent in direct patient consultation.  Additional time spent with chart review  (studies, outside notes, etc): 8 Total Time: 24 min   Current medicines are reviewed at length with the patient today.  (+/- concerns) n/a   Patient Instructions / Medication Changes & Studies & Tests Ordered   Patient Instructions  Medication Instructions:   continue with medication except  Entresto take 1/2 tablet in the morning and one whole tablet in the evening  If after 2 weeks you still are having dizziness  Reduce the evening dose to 1/2 tablet as well  Lasix ( furosemide) 20 mg tablet  may take if  Increase shortness of breathe or swelling.   *If you need a refill on your cardiac medications before your next appointment, please call your pharmacy*   Lab Work :cmp lipid  If you have labs (blood work) drawn today and your tests are completely normal, you will receive your results only by: Marland Kitchen MyChart Message (if you have MyChart) OR . A paper copy in the mail If you have any lab test that is abnormal or we need to change your treatment, we will call you to review the results.   Testing/Procedures: WILL BE SCHEDULE AT [facility]  [place]  - Aug 2021   Follow-Up: At [facility], you and your health needs are our priority.  As part of our continuing mission to provide you with exceptional heart care, we have created designated Provider Care Teams.  These Care Teams include your primary Cardiologist (physician) and Advanced Practice Providers (APPs -  Physician Assistants and Nurse Practitioners) who all work together to provide you with the care you need, when you need it.  We recommend signing up for the patient portal called "MyChart".  Sign up information is provided on this After Visit Summary.  MyChart is used to connect with patients for Virtual Visits (Telemedicine).  Patients are able to view lab/test results, encounter notes, upcoming appointments, etc.  Non-urgent messages can be sent to your provider as well.   To learn more about  what you can do with MyChart, go to [email or web address].    Your next appointment:    5  Months- Aug or Sept 2021 month(s)  The format for your next appointment:   In Person  Provider:   Glenetta Hew, MD   Other Instructions     Studies Ordered:   Orders Placed This Encounter  Procedures  . CT ANGIO CHEST AORTA W/CM & OR WO/CM  . Lipid panel  . Comprehensive metabolic panel  . EKG 12-Lead     Glenetta Hew, M.D., M.S. Interventional Cardiologist   Pager # [phone] Phone # [phone] [address] [place], [place] [address]   Thank you for choosing Heartcare at [facility]!!

## 2019-06-01 ENCOUNTER — Encounter: Payer: Self-pay | Admitting: Cardiology

## 2019-06-01 NOTE — Assessment & Plan Note (Signed)
Plan was 6 to 12 months warfarin, with presentation being stroke, I chose to do 12 months.  Plan: Check coronary CT angiogram to reassess for evidence of thrombus.  If no longer present, will switch from warfarin to Plavix.  Can check his lipid panel and chemistry panel as part of his pretest labs.

## 2019-06-01 NOTE — Assessment & Plan Note (Signed)
Most recent lipids show LDL of 94.  He is on 40 of rosuvastatin.  In light of the fact that he really does not want take extra medications, will hold off on further management now and reassess.  At the time of labs checked in November.  He should be due for labs to be checked soon.-Can get his labs done prior to his CT angio chest.

## 2019-06-01 NOTE — Assessment & Plan Note (Signed)
No longer on aspirin or Brilinta.  Is on warfarin.

## 2019-06-01 NOTE — Assessment & Plan Note (Signed)
EF improved 35-40% after PCI and medical management.  Not in the threshold for ICD.  With him having lots of orthostatic dizziness and fatigue, and on the people he can tolerate current dose of Entresto.  Plan:   Continue current dose of Toprol, but reduce morning dose of Entresto to one half tab.  If he continues to have lightheadedness and dizziness after 2 weeks, will reduce to 1/2 tablet twice daily.  Encourage increase hydration.  Seems euvolemic.  No requirement for diuretic.

## 2019-06-01 NOTE — Assessment & Plan Note (Signed)
I am sure this has some role in his dizziness, encouraged increase hydration.

## 2019-06-01 NOTE — Assessment & Plan Note (Signed)
We talked a little bit of smoking cessation, but he was not interested to discuss too much.  He agrees to try to cut back.

## 2019-06-01 NOTE — Assessment & Plan Note (Signed)
Continue warfarin.  We need to reassess if there is any evidence of residual aortic thrombus 1 year out.  This would be in July August timeframe. Plan to recheck CT angiogram of the chest, if thrombus is gone, will stop warfarin and likely restart Plavix.

## 2019-06-01 NOTE — Assessment & Plan Note (Signed)
Significant two-vessel CAD with PCI of the LAD and PTCA of the diagonal branch.  Initially EF is significantly reduced now better.  Is not having any further anginal symptoms.  Does not have a lot of blood pressure room to treat.  Plan:  Continue current dose of Toprol and Entresto along with rosuvastatin.  Not on antiplatelet agent because of being on warfarin for aortic thrombus.

## 2019-06-03 ENCOUNTER — Telehealth: Payer: Self-pay | Admitting: Cardiology

## 2019-06-03 NOTE — Telephone Encounter (Signed)
Spoke with patient regarding appointment for CTA chest aorta scheduled Wednesday 06/15/19 at 11:00 am at [facility].  Arrival time is 10:45 am for check in---liquids only 4 hours prior to study------patient to come next week (Monday or Tuesday) for labs.

## 2019-06-07 ENCOUNTER — Other Ambulatory Visit: Payer: Self-pay

## 2019-06-07 ENCOUNTER — Other Ambulatory Visit: Payer: Self-pay | Admitting: Urology

## 2019-06-07 DIAGNOSIS — E785 Hyperlipidemia, unspecified: Secondary | ICD-10-CM

## 2019-06-07 DIAGNOSIS — I25119 Atherosclerotic heart disease of native coronary artery with unspecified angina pectoris: Secondary | ICD-10-CM

## 2019-06-07 DIAGNOSIS — Z79899 Other long term (current) drug therapy: Secondary | ICD-10-CM

## 2019-06-08 ENCOUNTER — Telehealth: Payer: Self-pay | Admitting: Cardiology

## 2019-06-08 LAB — HEPATIC FUNCTION PANEL
ALT: 69 IU/L — ABNORMAL HIGH (ref 0–44)
AST: 91 IU/L — ABNORMAL HIGH (ref 0–40)
Albumin: 4.2 g/dL (ref 3.8–4.9)
Alkaline Phosphatase: 103 IU/L (ref 39–117)
Bilirubin Total: 0.2 mg/dL (ref 0.0–1.2)
Bilirubin, Direct: 0.1 mg/dL (ref 0.00–0.40)
Total Protein: 7 g/dL (ref 6.0–8.5)

## 2019-06-08 LAB — LIPID PANEL
Chol/HDL Ratio: 2.4 ratio (ref 0.0–5.0)
Cholesterol, Total: 133 mg/dL (ref 100–199)
HDL: 56 mg/dL (ref >39)
LDL Chol Calc (NIH): 49 mg/dL (ref 0–99)
Triglycerides: 167 mg/dL — ABNORMAL HIGH (ref 0–149)
VLDL Cholesterol Cal: 28 mg/dL (ref 5–40)

## 2019-06-08 NOTE — Telephone Encounter (Signed)
Patient states he received a call from our office. I saw where Dr. Ellyn Hack put in a note regarding lab results today about 2pm - not sure who called him.

## 2019-06-08 NOTE — Telephone Encounter (Signed)
Called patient, gave lab results.  Patient verbalized understanding. 

## 2019-06-15 ENCOUNTER — Other Ambulatory Visit (HOSPITAL_BASED_OUTPATIENT_CLINIC_OR_DEPARTMENT_OTHER): Payer: Medicaid Other

## 2019-06-21 ENCOUNTER — Encounter: Payer: Self-pay | Admitting: Cardiology

## 2019-06-21 ENCOUNTER — Encounter: Payer: Self-pay | Admitting: *Deleted

## 2019-06-24 ENCOUNTER — Ambulatory Visit (INDEPENDENT_AMBULATORY_CARE_PROVIDER_SITE_OTHER): Payer: Medicaid Other | Admitting: Pharmacist Clinician (PhC)/ Clinical Pharmacy Specialist

## 2019-06-24 ENCOUNTER — Other Ambulatory Visit: Payer: Self-pay

## 2019-06-24 DIAGNOSIS — I7410 Embolism and thrombosis of unspecified parts of aorta: Secondary | ICD-10-CM

## 2019-06-24 DIAGNOSIS — I639 Cerebral infarction, unspecified: Secondary | ICD-10-CM

## 2019-06-24 DIAGNOSIS — Z8673 Personal history of transient ischemic attack (TIA), and cerebral infarction without residual deficits: Secondary | ICD-10-CM

## 2019-06-24 DIAGNOSIS — Z7901 Long term (current) use of anticoagulants: Secondary | ICD-10-CM | POA: Diagnosis not present

## 2019-06-24 LAB — POCT INR: INR: 2.6 (ref 2.0–3.0)

## 2019-06-30 ENCOUNTER — Ambulatory Visit: Payer: Medicaid Other | Attending: Internal Medicine

## 2019-06-30 DIAGNOSIS — Z23 Encounter for immunization: Secondary | ICD-10-CM

## 2019-06-30 NOTE — Progress Notes (Signed)
   Covid-19 Vaccination Clinic  Name:  Daniel Weiss    MRN: [id number] DOB: 1960/08/18  06/30/2019  Daniel Weiss was observed post Covid-19 immunization for 15 minutes without incident. He was provided with Vaccine Information Sheet and instruction to access the V-Safe system.   Daniel Weiss was instructed to call 911 with any severe reactions post vaccine: Marland Kitchen Difficulty breathing  . Swelling of face and throat  . A fast heartbeat  . A bad rash all over body  . Dizziness and weakness   Immunizations Administered    Name Date Dose VIS Date Route   Pfizer COVID-19 Vaccine 06/30/2019  8:34 AM 0.3 mL 05/04/2018 Intramuscular   Manufacturer: [place]   Lot: [id number]   [place]: [id number]

## 2019-07-04 ENCOUNTER — Other Ambulatory Visit: Payer: Self-pay | Admitting: Urology

## 2019-07-25 ENCOUNTER — Ambulatory Visit (INDEPENDENT_AMBULATORY_CARE_PROVIDER_SITE_OTHER): Payer: Medicaid Other | Admitting: Pharmacist

## 2019-07-25 ENCOUNTER — Other Ambulatory Visit: Payer: Self-pay

## 2019-07-25 DIAGNOSIS — Z7901 Long term (current) use of anticoagulants: Secondary | ICD-10-CM

## 2019-07-25 DIAGNOSIS — Z8673 Personal history of transient ischemic attack (TIA), and cerebral infarction without residual deficits: Secondary | ICD-10-CM | POA: Diagnosis not present

## 2019-07-25 LAB — POCT INR: INR: 3.2 — AB (ref 2.0–3.0)

## 2019-07-26 ENCOUNTER — Encounter (HOSPITAL_BASED_OUTPATIENT_CLINIC_OR_DEPARTMENT_OTHER): Payer: Self-pay | Admitting: *Deleted

## 2019-07-26 ENCOUNTER — Emergency Department (HOSPITAL_BASED_OUTPATIENT_CLINIC_OR_DEPARTMENT_OTHER): Payer: Medicaid Other

## 2019-07-26 ENCOUNTER — Emergency Department (HOSPITAL_BASED_OUTPATIENT_CLINIC_OR_DEPARTMENT_OTHER)
Admission: EM | Admit: 2019-07-26 | Discharge: 2019-07-26 | Disposition: A | Discharge status: Home / Self Care | Payer: Medicaid Other | Attending: Emergency Medicine | Admitting: Emergency Medicine

## 2019-07-26 ENCOUNTER — Other Ambulatory Visit: Payer: Self-pay

## 2019-07-26 DIAGNOSIS — R079 Chest pain, unspecified: Secondary | ICD-10-CM

## 2019-07-26 DIAGNOSIS — R0789 Other chest pain: Secondary | ICD-10-CM | POA: Insufficient documentation

## 2019-07-26 DIAGNOSIS — Z79899 Other long term (current) drug therapy: Secondary | ICD-10-CM | POA: Insufficient documentation

## 2019-07-26 DIAGNOSIS — Z7901 Long term (current) use of anticoagulants: Secondary | ICD-10-CM | POA: Diagnosis not present

## 2019-07-26 DIAGNOSIS — I110 Hypertensive heart disease with heart failure: Secondary | ICD-10-CM | POA: Diagnosis not present

## 2019-07-26 DIAGNOSIS — E785 Hyperlipidemia, unspecified: Secondary | ICD-10-CM | POA: Insufficient documentation

## 2019-07-26 DIAGNOSIS — J449 Chronic obstructive pulmonary disease, unspecified: Secondary | ICD-10-CM | POA: Insufficient documentation

## 2019-07-26 DIAGNOSIS — I252 Old myocardial infarction: Secondary | ICD-10-CM | POA: Diagnosis not present

## 2019-07-26 DIAGNOSIS — I5020 Unspecified systolic (congestive) heart failure: Secondary | ICD-10-CM | POA: Diagnosis not present

## 2019-07-26 DIAGNOSIS — Z8673 Personal history of transient ischemic attack (TIA), and cerebral infarction without residual deficits: Secondary | ICD-10-CM | POA: Insufficient documentation

## 2019-07-26 DIAGNOSIS — Z9861 Coronary angioplasty status: Secondary | ICD-10-CM | POA: Diagnosis not present

## 2019-07-26 DIAGNOSIS — F17210 Nicotine dependence, cigarettes, uncomplicated: Secondary | ICD-10-CM | POA: Insufficient documentation

## 2019-07-26 DIAGNOSIS — I2510 Atherosclerotic heart disease of native coronary artery without angina pectoris: Secondary | ICD-10-CM | POA: Insufficient documentation

## 2019-07-26 LAB — CBC WITH DIFFERENTIAL/PLATELET
Abs Immature Granulocytes: 0.04 10*3/uL (ref 0.00–0.07)
Basophils Absolute: 0.1 10*3/uL (ref 0.0–0.1)
Basophils Relative: 1 %
Eosinophils Absolute: 0 10*3/uL (ref 0.0–0.5)
Eosinophils Relative: 0 %
HCT: 39.1 % (ref 39.0–52.0)
Hemoglobin: 13.1 g/dL (ref 13.0–17.0)
Immature Granulocytes: 1 %
Lymphocytes Relative: 32 %
Lymphs Abs: 2.3 10*3/uL (ref 0.7–4.0)
MCH: 27 pg (ref 26.0–34.0)
MCHC: 33.5 g/dL (ref 30.0–36.0)
MCV: 80.6 fL (ref 80.0–100.0)
Monocytes Absolute: 0.9 10*3/uL (ref 0.1–1.0)
Monocytes Relative: 12 %
Neutro Abs: 3.9 10*3/uL (ref 1.7–7.7)
Neutrophils Relative %: 54 %
Platelets: 340 10*3/uL (ref 150–400)
RBC: 4.85 MIL/uL (ref 4.22–5.81)
RDW: 19 % — ABNORMAL HIGH (ref 11.5–15.5)
WBC: 7.1 10*3/uL (ref 4.0–10.5)
nRBC: 0.3 % — ABNORMAL HIGH (ref 0.0–0.2)

## 2019-07-26 LAB — BASIC METABOLIC PANEL
Anion gap: 12 (ref 5–15)
BUN: 14 mg/dL (ref 6–20)
CO2: 23 mmol/L (ref 22–32)
Calcium: 9.5 mg/dL (ref 8.9–10.3)
Chloride: 99 mmol/L (ref 98–111)
Creatinine, Ser: 0.74 mg/dL (ref 0.61–1.24)
GFR calc Af Amer: >60 mL/min (ref >60)
GFR calc non Af Amer: >60 mL/min (ref >60)
Glucose, Bld: 111 mg/dL — ABNORMAL HIGH (ref 70–99)
Potassium: 4.5 mmol/L (ref 3.5–5.1)
Sodium: 134 mmol/L — ABNORMAL LOW (ref 135–145)

## 2019-07-26 LAB — TROPONIN I (HIGH SENSITIVITY)
Troponin I (High Sensitivity): 10 ng/L (ref <18)
Troponin I (High Sensitivity): 27 ng/L — ABNORMAL HIGH (ref <18)

## 2019-07-26 LAB — PROTIME-INR
INR: 2.1 — ABNORMAL HIGH (ref 0.8–1.2)
Prothrombin Time: 23.1 seconds — ABNORMAL HIGH (ref 11.4–15.2)

## 2019-07-26 MED ORDER — SODIUM CHLORIDE 0.9 % IV BOLUS
500.0000 mL | Freq: Once | INTRAVENOUS | Status: AC
  Administered 2019-07-26: 500 mL via INTRAVENOUS

## 2019-07-26 MED ORDER — IOHEXOL 350 MG/ML SOLN
100.0000 mL | Freq: Once | INTRAVENOUS | Status: AC | PRN
  Administered 2019-07-26: 100 mL via INTRAVENOUS

## 2019-07-26 NOTE — ED Provider Notes (Addendum)
[facility] EMERGENCY DEPARTMENT Provider Note   CSN: [id number] Arrival date & time: 07/26/19  1404   History Chief Complaint  Patient presents with   Chest Pain    Daniel Weiss is a 59 y.o. male with coronary artery disease, COPD, ischemic cardiomyopathy, history of stroke, metastatic prostate cancer who presents with chest pain. He states that he had an episodes of throbbing/tight chest pain over the L side of his chest that woke him up from sleep around 3:15AM this morning. He got up on the side of the bed and that made him lightheaded and he was SOB. He has been having problems with positional lightheadedness and intermittent SOB for the past year or so but doesn't usually have chest pain. The pain wasn't going away so he took a nitro and went to sleep. He woke up today and was fine. He was on the phone with his daughter around noon and started to have chest pain again. He told his daughter and she urged him to come to the ED. He states the chest pain resolved after 10 minutes. He will have intermittent "hot flashes" at times. He also had some periumbilical abdominal pain earlier which resolved after drinking ginger ale. He denies leg swelling. He has been taking his meds as prescribed and denies any recent med changes.   HPI   Past Medical History:  Diagnosis Date   Abnormal chest CT 02/2018   a. Ectatic 3 cm Ao arch - rec f/u outpt imaging w/ CTA or MRA. Ectatic atheromatous abd Ao @ risk for aneurysm - rec f/u u/s in 5 yrs. Marked prostatic enlargement w/ scattered small sclerotic foci in the thoracic lower lumbar spine, sacrum, left 12th rib, pelvis, and right femur.  Recommend elective outpatient whole-body bone scintigraphy and PSA.   CAD (coronary artery disease)    a. 02/2018 ACS/PCI: LM nl, LAD 85p (3.5x15 Sierra DES), D1 45ost, RI 55ost, RCA nl, EF 25-35%.   Complete left bundle branch block (LBBB) 2016   Current every day smoker    Elevated PSA 11/2018    Enlarged prostate    HFrEF (heart failure with reduced ejection fraction) ([place])    Hypertension    Ischemic cardiomyopathy 02/2018   a. 02/2018 Echo: EF 35-40% (LV gram on cath was 25-30%), mod, diff HK. mid-apicalanteroseptal, ant, and apical sev HK. RVH.-->  No notable change on echo from June 2020 and August 2020.   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   History of CVA (cerebrovascular accident) 11/23/2018   Elevated PSA 11/23/2018   Long term (current) use of anticoagulants 11/16/2018   Malnutrition of moderate degree 11/11/2018   Acute cardioembolic stroke (HCC)    Acute cerebrovascular accident (CVA) ([place]) 11/03/2018   Presence of drug coated stent in LAD coronary artery 03/26/2018   Complete left bundle branch block (LBBB) 03/26/2018   Coronary artery disease involving native coronary artery of native heart with angina pectoris ([place]) 03/10/2018   Hyperlipidemia LDL goal <70 03/10/2018   Enlarged prostate 03/10/2018   Abnormal CT scan 03/10/2018   Ischemic cardiomyopathy 03/10/2018   Hypokalemia    Tobacco abuse    Non-ST elevation (NSTEMI) myocardial infarction ([place])  03/06/2018   Sinus tachycardia 03/06/2018   Encephalopathy, hypertensive    ARF (acute renal failure) ([place]) 06/29/2015   Hyponatremia 06/29/2015   Dehydration 06/29/2015   Prolonged QT interval 06/29/2015   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   H/O medication noncompliance    Other headache syndrome    Localized swelling, mass or lump of neck    Neck mass    History of TIA (transient ischemic attack)    Paresthesia 07/07/2014   Essential hypertension 07/07/2014   Right sided  weakness 07/07/2014   Mass of right side of neck 07/07/2014    Past Surgical History:  Procedure Laterality Date   CORONARY STENT INTERVENTION N/A 03/06/2018   Procedure: CORONARY STENT INTERVENTION;  Surgeon: [name], MD;  Location: [place] CV LAB;  Service: Cardiovascular: Proximal LAD 85% stenosis (and D1): DES PCI with Xience Sierra DES 3.5 mm x 15 mm - 3.9 mm   INTRAOPERATIVE TRANSTHORACIC ECHOCARDIOGRAM  03/06/2018   (Peri-MI) mild reduced EF 35-40%.  Diffuse hypokinesis (severe HK of the mid-apical anteroseptal, anterior and apical myocardium consistent with LAD infarct).  Unable to assess diastolic function.  Paradoxical septal motion likely related to his LBBB.  RV hypertrophy noted.  Trivial pericardial effusion noted.    LEFT HEART CATH AND CORONARY ANGIOGRAPHY N/A 03/06/2018   Procedure: LEFT HEART CATH AND CORONARY ANGIOGRAPHY;  Surgeon: [name], MD;  Location: [place] CV LAB;  Service: Cardiovascular: Proximal LAD 85% involving D1 45%.  Ostial RI 55%.  Severe LV dysfunction EF 25 to 30%.  1+ MR.  Only minimally elevated LVEDP.   TRANSTHORACIC ECHOCARDIOGRAM  10/2018   (Most recent echo, to evaluate for TIA/CVA) : Moderate reduced EF 35 to 40%.  Moderate concentric LVH.  Mild dyskinesis of the apex and severe hypokinesis of mid apical anteroseptal and anterior wall.  Unable to assess RV pressures.  Aortic sclerosis with no stenosis.  No significant change seen       Family History  Problem Relation Age of Onset   Stroke Mother    Hypertension Mother    Diabetes type II Mother    Leukemia Other    Breast cancer Other    Stroke Brother    CAD Neg Hx     Social History   Tobacco Use   Smoking status: Current Some Day Smoker    Packs/day: 0.50    Types: Cigarettes   Smokeless tobacco: Never Used  Substance Use Topics   Alcohol use: Yes    Alcohol/week: 2.0 standard drinks    Types: 2 Cans of beer per week    Comment: 2 Beers  daily.   Drug use: Yes    Frequency: 3.0 times per week    Types: Marijuana    Home Medications Prior to Admission medications   Medication Sig Start Date End Date Taking? Authorizing Provider  albuterol (VENTOLIN HFA) 108 (90 Base) MCG/ACT inhaler Inhale 2 puffs into the lungs every 6 (six) hours as needed for wheezing or shortness of breath. 05/23/19   Azzie Glatter, FNP  bicalutamide (CASODEX) 50 MG tablet Take 1 tablet by mouth twice daily 07/07/19   McKenzie, Candee Furbish, MD  furosemide (LASIX) 20 MG tablet May take 20 mg tablet as needed for increase shortness of breath or swelling 05/30/19   [name], MD  metoprolol succinate (TOPROL XL) 25 MG 24 hr tablet Take 0.5 tablets (12.5 mg total) by mouth daily. 01/31/19   [name], MD  nitroGLYCERIN Delilah Shan)  0.4 MG SL tablet Place 1 tablet (0.4 mg total) under the tongue every 5 (five) minutes as needed for chest pain. 03/10/18   Theora Gianotti, NP  rosuvastatin (CRESTOR) 40 MG tablet Take 1 tablet (40 mg total) by mouth daily. 02/09/19 05/10/19  [name], MD  sacubitril-valsartan Delene Loll) 24-26 MG Take 1/2 tablet in the morning and 1 tablet in the evening 05/30/19   [name], MD  tamsulosin [facility]) 0.4 MG CAPS capsule Take 1 capsule (0.4 mg total) by mouth daily after supper. 03/25/19   [name], MD  warfarin (COUMADIN) 5 MG tablet Take 1-2 tablets by mouth daily as directed by coumadin clinic 04/22/19   Buford Dresser, MD    Allergies    Patient has no known allergies.  Review of Systems   Review of Systems  Constitutional: Positive for activity change and diaphoresis. Negative for chills and fever.  Respiratory: Positive for shortness of breath and wheezing. Negative for cough.   Cardiovascular: Positive for chest pain. Negative for palpitations and leg swelling.  Gastrointestinal: Positive for abdominal pain. Negative for diarrhea, nausea and vomiting.  Genitourinary: Negative  for difficulty urinating and dysuria.  Neurological: Positive for dizziness and light-headedness. Negative for syncope.  Hematological: Bruises/bleeds easily.  All other systems reviewed and are negative.   Physical Exam Updated Vital Signs BP (!) 135/94 (BP Location: Right Arm)    Pulse (!) 110    Temp 98.6 F (37 C) (Oral)    Resp 14    Ht 6' (1.829 m)    Wt 63 kg    SpO2 100%    BMI 18.84 kg/m   Physical Exam Vitals and nursing note reviewed.  Constitutional:      General: He is not in acute distress.    Appearance: Normal appearance. He is well-developed. He is not ill-appearing.     Comments: Calm, cooperative. NAD  HENT:     Head: Normocephalic and atraumatic.     Comments: Mass on the left side of the face Eyes:     General: No scleral icterus.       Right eye: No discharge.        Left eye: No discharge.     Conjunctiva/sclera: Conjunctivae normal.     Pupils: Pupils are equal, round, and reactive to light.  Cardiovascular:     Rate and Rhythm: Normal rate and regular rhythm.  Pulmonary:     Effort: Pulmonary effort is normal. No respiratory distress.     Breath sounds: Normal breath sounds.  Abdominal:     General: There is no distension.     Palpations: Abdomen is soft.     Tenderness: There is no abdominal tenderness.  Musculoskeletal:     Cervical back: Normal range of motion.     Right lower leg: No edema.     Left lower leg: No edema.  Skin:    General: Skin is warm and dry.  Neurological:     Mental Status: He is alert and oriented to person, place, and time.  Psychiatric:        Behavior: Behavior normal.     ED Results / Procedures / Treatments   Labs (all labs ordered are listed, but only abnormal results are displayed) Labs Reviewed  CBC WITH DIFFERENTIAL/PLATELET - Abnormal; Notable for the following components:      Result Value   RDW 19.0 (*)    nRBC 0.3 (*)    All other components within normal  limits  BASIC METABOLIC PANEL - Abnormal;  Notable for the following components:   Sodium 134 (*)    Glucose, Bld 111 (*)    All other components within normal limits  PROTIME-INR - Abnormal; Notable for the following components:   Prothrombin Time 23.1 (*)    INR 2.1 (*)    All other components within normal limits  TROPONIN I (HIGH SENSITIVITY)  TROPONIN I (HIGH SENSITIVITY)    EKG EKG Interpretation  Date/Time:  Tuesday Jul 26 2019 14:29:42 EDT Ventricular Rate:  112 PR Interval:    QRS Duration: 136 QT Interval:  335 QTC Calculation: 458 R Axis:   80 Text Interpretation: Sinus tachycardia Ventricular premature complex Prominent P waves, nondiagnostic Left bundle branch block Confirmed by Madalyn Rob [phone]) on 07/26/2019 5:05:20 PM   Radiology CT Angio Chest PE W/Cm &/Or Wo Cm  Result Date: 07/26/2019 CLINICAL DATA:  59 year old male with intermittent chest pain for 1 week. Tachycardia and shortness of breath. EMR states ?elevated PSA, enlarged prostate?Marland Kitchen EXAM: CT ANGIOGRAPHY CHEST WITH CONTRAST TECHNIQUE: Multidetector CT imaging of the chest was performed using the standard protocol during bolus administration of intravenous contrast. Multiplanar CT image reconstructions and MIPs were obtained to evaluate the vascular anatomy. CONTRAST:  179mL OMNIPAQUE IOHEXOL 350 MG/ML SOLN COMPARISON:  CTA chest 01/31/2019. FINDINGS: Cardiovascular: Good contrast bolus timing in the pulmonary arterial tree. Mild lower lung respiratory motion. No focal filling defect identified in the pulmonary arteries to suggest acute pulmonary embolism. Negative visible aorta. Calcified coronary artery atherosclerosis and/or stent. No cardiomegaly or pericardial effusion. Mediastinum/Nodes: Negative. No lymphadenopathy. Lungs/Pleura: Major airways are patent. Lower lung volumes compared to November, but both lungs remain essentially clear. There is a stable small right middle lobe subpleural lung nodule since 03/06/2018 on series 6, image 71  (benign). Upper Abdomen: Multiple small hypodense areas in the visible liver appear stable since November and are probably benign. Negative visible gallbladder, spleen, pancreas, adrenal glands, kidneys and bowel in the upper abdomen. Musculoskeletal: Increased round sclerotic lesions scattered throughout the visible spine compared to December 2019. larger round sclerotic lesions in the C7 and T3 vertebrae. Chronic lesions at T9 and T12 also appear mildly progressed. Increased conspicuity also of small lesions in the sternum. No lytic or destructive osseous lesion identified. Review of the MIP images confirms the above findings. IMPRESSION: 1. Negative for acute pulmonary embolus. 2. No acute pulmonary finding. Stable since 2019 and benign right middle lobe lung nodule. 3. Numerous small sclerotic bone lesions with some areas progressed since December 2019. These are most compatible with skeletal metastases from prostate cancer in this setting. Electronically Signed   By: Genevie Ann M.D.   On: 07/26/2019 16:22    Procedures Procedures (including critical care time)  Medications Ordered in ED Medications  sodium chloride 0.9 % bolus 500 mL (500 mLs Intravenous New Bag/Given 07/26/19 1605)  iohexol (OMNIPAQUE) 350 MG/ML injection 100 mL (100 mLs Intravenous Contrast Given 07/26/19 1552)    ED Course  I have reviewed the triage vital signs and the nursing notes.  Pertinent labs & imaging results that were available during my care of the patient were reviewed by me and considered in my medical decision making (see chart for details).  59 year old male presents with 2 episodes of chest pain that occurred today while at rest. CP has typical and atypical features. Chest pain work up is reassuring here. Doubt ACS, PE, pericarditis, esophageal rupture, tension pneumothorax, aortic dissection, cardiac tamponade. Could  be anginal pain since it improved with nitro. EKG is sinus tachycardia with LBBB and appears  unchanged. INR is therapeutic. Initial troponin is 10. Labs are unremarkable. CTA of chest was obtained due to hx of metastatic cancer with CP/SOB and lightheadedness however this is negative. CP has resolved and he has not had recurrence here.  Shared visit with Dr. Roslynn Amble.  At shift change 2nd trop is pending. Care signed out to [facility]. If 2nd trop is flat and normal will d/c with cards f/u. He has an appointment next week with Dr. Ellyn Hack. If positive or he develops severe or concerning chest pain, will re-evaluate and consider cards consult.   MDM Rules/Calculators/A&P                      Final Clinical Impression(s) / ED Diagnoses Final diagnoses:  Nonspecific chest pain    Rx / DC Orders ED Discharge Orders    None       Recardo Evangelist, PA-C 07/26/19 1757    Recardo Evangelist, PA-C 07/26/19 Judithann Graves, MD 07/27/19 1310

## 2019-07-26 NOTE — ED Provider Notes (Signed)
Physical Exam  BP (!) 136/95 (BP Location: Right Arm)   Pulse 89   Temp 98.6 F (37 C) (Oral)   Resp 20   Ht 6' (1.829 m)   Wt 63 kg   SpO2 99%   BMI 18.84 kg/m   Physical Exam Vitals and nursing note reviewed.  Constitutional:      General: He is not in acute distress.    Appearance: He is well-developed. He is not diaphoretic.  HENT:     Head: Normocephalic and atraumatic.  Eyes:     General: No scleral icterus.    Conjunctiva/sclera: Conjunctivae normal.  Pulmonary:     Effort: Pulmonary effort is normal. No respiratory distress.  Musculoskeletal:     Cervical back: Normal range of motion.  Skin:    Findings: No rash.  Neurological:     Mental Status: He is alert.     ED Course/Procedures     Procedures  MDM   Care of patient assumed from PA Gekas at 6:00 PM.  Agree with history, physical exam and plan.  See their note for further details.  Briefly, 59 y.o. male with PMH/PSH as below who presents with left-sided chest pain that woke him up from sleep at 3:15 AM this morning.  Improvement noted with nitroglycerin and sleeping.  Around 12 noon started having chest pain again, was speaking to his daughter and she urged him to come to the ED.  Pain resolved after 10 minutes.  CT angio of the chest is negative for PE, EKG shows left bundle block which is essentially unchanged from prior.  Initial troponin is negative.  Patient chest pain-free throughout ED course.  Past Medical History:  Diagnosis Date  . Abnormal chest CT 02/2018   a. Ectatic 3 cm Ao arch - rec f/u outpt imaging w/ CTA or MRA. Ectatic atheromatous abd Ao @ risk for aneurysm - rec f/u u/s in 5 yrs. Marked prostatic enlargement w/ scattered small sclerotic foci in the thoracic lower lumbar spine, sacrum, left 12th rib, pelvis, and right femur.  Recommend elective outpatient whole-body bone scintigraphy and PSA.  Marland Kitchen CAD (coronary artery disease)    a. 02/2018 ACS/PCI: LM nl, LAD 85p (3.5x15 Sierra DES), D1  45ost, RI 55ost, RCA nl, EF 25-35%.  . Complete left bundle branch block (LBBB) 2016  . Current every day smoker   . Elevated PSA 11/2018  . Enlarged prostate   . HFrEF (heart failure with reduced ejection fraction) ([place])   . Hypertension   . Ischemic cardiomyopathy 02/2018   a. 02/2018 Echo: EF 35-40% (LV gram on cath was 25-30%), mod, diff HK. mid-apicalanteroseptal, ant, and apical sev HK. RVH.-->  No notable change on echo from June 2020 and August 2020.  Marland Kitchen Myocardial infarction ([place])   . NSTEMI (non-ST elevated myocardial infarction) ([place]) 02/2018  . Stroke ([place])   . TIA (transient ischemic attack) 2016  . Warthin's tumor    Past Surgical History:  Procedure Laterality Date  . CORONARY STENT INTERVENTION N/A 03/06/2018   Procedure: CORONARY STENT INTERVENTION;  Surgeon: [name], MD;  Location: [place] CV LAB;  Service: Cardiovascular: Proximal LAD 85% stenosis (and D1): DES PCI with Xience Sierra DES 3.5 mm x 15 mm - 3.9 mm  . INTRAOPERATIVE TRANSTHORACIC ECHOCARDIOGRAM  03/06/2018   (Peri-MI) mild reduced EF 35-40%.  Diffuse hypokinesis (severe HK of the mid-apical anteroseptal, anterior and apical myocardium consistent with LAD infarct).  Unable to assess diastolic function.  Paradoxical septal  motion likely related to his LBBB.  RV hypertrophy noted.  Trivial pericardial effusion noted.   Marland Kitchen LEFT HEART CATH AND CORONARY ANGIOGRAPHY N/A 03/06/2018   Procedure: LEFT HEART CATH AND CORONARY ANGIOGRAPHY;  Surgeon: [name], MD;  Location: [place] CV LAB;  Service: Cardiovascular: Proximal LAD 85% involving D1 45%.  Ostial RI 55%.  Severe LV dysfunction EF 25 to 30%.  1+ MR.  Only minimally elevated LVEDP.  Marland Kitchen TRANSTHORACIC ECHOCARDIOGRAM  10/2018   (Most recent echo, to evaluate for TIA/CVA) : Moderate reduced EF 35 to 40%.  Moderate concentric LVH.  Mild dyskinesis of the apex and severe hypokinesis of mid apical anteroseptal and anterior wall.  Unable to assess RV  pressures.  Aortic sclerosis with no stenosis.  No significant change seen      Current Plan: Obtain delta troponin and reassess.   MDM/ED Course: Delta troponin and return as 27, up from initial of 10.  Spoke to Dr. Debara Pickett, cardiology on-call who reviewed patient's recent catheterization as well as his lab work.  Patient continues to be chest pain-free with unremarkable vital signs.  Per cardiology recommendations, patient is safe for discharge home with follow-up with Dr. Ellyn Hack.  Dr. Debara Pickett will send Dr. Ellyn Hack a message regarding follow-up.  Patient states that he has an appointment next week.  In the meantime encouraged him to return to the ED for any worsening chest pain.   Consults: Cardiology   Significant labs/images: Labs Reviewed  CBC WITH DIFFERENTIAL/PLATELET - Abnormal; Notable for the following components:      Result Value   RDW 19.0 (*)    nRBC 0.3 (*)    All other components within normal limits  BASIC METABOLIC PANEL - Abnormal; Notable for the following components:   Sodium 134 (*)    Glucose, Bld 111 (*)    All other components within normal limits  PROTIME-INR - Abnormal; Notable for the following components:   Prothrombin Time 23.1 (*)    INR 2.1 (*)    All other components within normal limits  TROPONIN I (HIGH SENSITIVITY) - Abnormal; Notable for the following components:   Troponin I (High Sensitivity) 27 (*)    All other components within normal limits  TROPONIN I (HIGH SENSITIVITY)    I personally reviewed and interpreted all labs.  The plan for this patient was discussed with Dr. Roslynn Amble, who voiced agreement and who oversaw evaluation and treatment of this patient.  All imaging, if done today, including plain films, CT scans, and ultrasounds, independently reviewed by me, and interpretations confirmed via formal radiology reads.  Patient is hemodynamically stable, in NAD, and able to ambulate in the ED. Evaluation does not show pathology that  would require ongoing emergent intervention or inpatient treatment. I explained the diagnosis to the patient. Pain has been managed and has no complaints prior to discharge. Patient is comfortable with above plan and is stable for discharge at this time. All questions were answered prior to disposition. Strict return precautions for returning to the ED were discussed. Encouraged follow up with PCP.   An After Visit Summary was printed and given to the patient.   Portions of this note were generated with [employer]. Dictation errors may occur despite best attempts at proofreading.      Delia Heady, PA-C 07/26/19 2006    Lucrezia Starch, MD 07/27/19 1257

## 2019-07-26 NOTE — Discharge Instructions (Addendum)
You will need to return to the ED if you start to have worsening chest pain, shortness of breath, leg swelling. Is important for you to follow-up with your cardiologist, Dr. Ellyn Hack

## 2019-07-26 NOTE — ED Notes (Signed)
Patient transported to CT 

## 2019-07-26 NOTE — ED Triage Notes (Signed)
Chest pain off and on for a week. Pain is sharp. Heart rate 141 at triage.

## 2019-07-27 ENCOUNTER — Telehealth: Payer: Self-pay | Admitting: Family Medicine

## 2019-07-27 NOTE — Telephone Encounter (Signed)
Rodriguez-Guzman, Raquel, RPH-CPP  Azzie Glatter, FNP  Cc: [name], MD  Patient presets today for INR check at the coumadin clinic and complains about increased dizziness and having some hot flashes (worsen over the last 30 days). BP in office 140/86.   Will it be possible to contact patient to arrange further assessment.    Raquel Rodriguez-Guzman PharmD, BCPS, [place]  [place] [address]  07/25/2019 11:07 AM"      In response to above message: Multiple attempts to contact patient to assess concerns. Message left for patient to contact us and schedule follow up appointment with our office within the next 2 days.    Kathe Becton,  MSN, FNP-BC [place] [address] [place], [place] [address] [phone]- fax

## 2019-08-01 ENCOUNTER — Ambulatory Visit: Payer: Medicaid Other | Attending: Internal Medicine

## 2019-08-01 DIAGNOSIS — Z23 Encounter for immunization: Secondary | ICD-10-CM

## 2019-08-01 NOTE — Progress Notes (Signed)
   Covid-19 Vaccination Clinic  Name:  Camon Barb    MRN: [id number] DOB: 09/16/1960  08/01/2019  Mr. Lamons was observed post Covid-19 immunization for 15 minutes without incident. He was provided with Vaccine Information Sheet and instruction to access the V-Safe system.   Mr. Toothman was instructed to call 911 with any severe reactions post vaccine: Marland Kitchen Difficulty breathing  . Swelling of face and throat  . A fast heartbeat  . A bad rash all over body  . Dizziness and weakness   Immunizations Administered    Name Date Dose VIS Date Route   Pfizer COVID-19 Vaccine 08/01/2019 10:06 AM 0.3 mL 05/04/2018 Intramuscular   Manufacturer: [employer]   Lot: [id number]   [place]: [id number]

## 2019-08-02 ENCOUNTER — Encounter: Payer: Self-pay | Admitting: Family Medicine

## 2019-08-02 ENCOUNTER — Ambulatory Visit (INDEPENDENT_AMBULATORY_CARE_PROVIDER_SITE_OTHER): Payer: Medicaid Other | Admitting: Family Medicine

## 2019-08-02 ENCOUNTER — Other Ambulatory Visit: Payer: Self-pay

## 2019-08-02 VITALS — BP 154/90 | HR 83 | Temp 98.5°F | Ht 72.0 in | Wt 145.4 lb

## 2019-08-02 DIAGNOSIS — I10 Essential (primary) hypertension: Secondary | ICD-10-CM | POA: Diagnosis not present

## 2019-08-02 LAB — POCT URINALYSIS DIPSTICK
Bilirubin, UA: NEGATIVE
Glucose, UA: NEGATIVE
Ketones, UA: NEGATIVE
Leukocytes, UA: NEGATIVE
Nitrite, UA: NEGATIVE
Protein, UA: POSITIVE — AB
Spec Grav, UA: >=1.03 — AB (ref 1.010–1.025)
Urobilinogen, UA: 0.2 E.U./dL
pH, UA: 5.5 (ref 5.0–8.0)

## 2019-08-13 ENCOUNTER — Other Ambulatory Visit: Payer: Self-pay | Admitting: Urology

## 2019-08-15 ENCOUNTER — Ambulatory Visit (INDEPENDENT_AMBULATORY_CARE_PROVIDER_SITE_OTHER): Payer: Medicaid Other | Admitting: Pharmacist Clinician (PhC)/ Clinical Pharmacy Specialist

## 2019-08-15 ENCOUNTER — Other Ambulatory Visit: Payer: Self-pay

## 2019-08-15 ENCOUNTER — Other Ambulatory Visit: Payer: Self-pay | Admitting: Cardiology

## 2019-08-15 DIAGNOSIS — Z8673 Personal history of transient ischemic attack (TIA), and cerebral infarction without residual deficits: Secondary | ICD-10-CM

## 2019-08-15 DIAGNOSIS — Z7901 Long term (current) use of anticoagulants: Secondary | ICD-10-CM | POA: Diagnosis not present

## 2019-08-15 DIAGNOSIS — I639 Cerebral infarction, unspecified: Secondary | ICD-10-CM

## 2019-08-15 LAB — POCT INR: INR: 2.1 (ref 2.0–3.0)

## 2019-09-06 ENCOUNTER — Other Ambulatory Visit (HOSPITAL_COMMUNITY): Payer: Self-pay | Admitting: Urology

## 2019-09-06 DIAGNOSIS — C61 Malignant neoplasm of prostate: Secondary | ICD-10-CM

## 2019-09-23 ENCOUNTER — Telehealth: Payer: Self-pay | Admitting: *Deleted

## 2019-09-23 ENCOUNTER — Encounter (HOSPITAL_COMMUNITY): Payer: Medicaid Other

## 2019-09-23 NOTE — Telephone Encounter (Signed)
Spoke with patient regarding appointment for CTA chest/aorta scheduled Friday 10/28/19 at 3:00pm at [facility]---arrival time is 2:30 pm 1st floor admissions office---Liquids only 4 hours prior to study---patient to come to our office 1 week prior to appointment for lab work.  Will mail information to patient and he voiced his understanding.

## 2019-10-06 ENCOUNTER — Encounter (HOSPITAL_COMMUNITY)
Admission: RE | Admit: 2019-10-06 | Discharge: 2019-10-06 | Disposition: A | Discharge status: Home / Self Care | Payer: Medicaid Other | Source: Ambulatory Visit | Attending: Urology | Admitting: Urology

## 2019-10-06 ENCOUNTER — Other Ambulatory Visit: Payer: Self-pay

## 2019-10-06 DIAGNOSIS — C61 Malignant neoplasm of prostate: Secondary | ICD-10-CM | POA: Diagnosis present

## 2019-10-06 MED ORDER — TECHNETIUM TC 99M MEDRONATE IV KIT
20.0000 | PACK | Freq: Once | INTRAVENOUS | Status: AC | PRN
  Administered 2019-10-06: 21.1 via INTRAVENOUS

## 2019-10-11 ENCOUNTER — Telehealth: Payer: Self-pay | Admitting: Cardiology

## 2019-10-11 NOTE — Telephone Encounter (Signed)
Left message for patient to call so we can discuss the prior authorization for the CTA chest aorta scheduled 10/31/19 at [facility]

## 2019-10-14 ENCOUNTER — Other Ambulatory Visit: Payer: Self-pay

## 2019-10-14 ENCOUNTER — Ambulatory Visit (INDEPENDENT_AMBULATORY_CARE_PROVIDER_SITE_OTHER): Payer: Medicaid Other

## 2019-10-14 DIAGNOSIS — Z7901 Long term (current) use of anticoagulants: Secondary | ICD-10-CM | POA: Diagnosis not present

## 2019-10-14 DIAGNOSIS — Z8673 Personal history of transient ischemic attack (TIA), and cerebral infarction without residual deficits: Secondary | ICD-10-CM | POA: Diagnosis not present

## 2019-10-14 LAB — POCT INR: INR: 2.1 (ref 2.0–3.0)

## 2019-10-14 NOTE — Patient Instructions (Signed)
Continue with 10mg  daily except for 5mg  every Friday   Repeat INR in 4 weeks

## 2019-10-16 ENCOUNTER — Other Ambulatory Visit: Payer: Self-pay | Admitting: Cardiology

## 2019-10-19 ENCOUNTER — Ambulatory Visit: Payer: Medicaid Other | Admitting: Urology

## 2019-10-25 NOTE — Progress Notes (Addendum)
error 

## 2019-10-26 ENCOUNTER — Encounter: Payer: Self-pay | Admitting: Urology

## 2019-10-26 ENCOUNTER — Other Ambulatory Visit: Payer: Self-pay

## 2019-10-26 ENCOUNTER — Ambulatory Visit (INDEPENDENT_AMBULATORY_CARE_PROVIDER_SITE_OTHER): Payer: Medicaid Other | Admitting: Urology

## 2019-10-26 VITALS — BP 92/66 | HR 93 | Ht 72.0 in | Wt 145.0 lb

## 2019-10-26 DIAGNOSIS — C61 Malignant neoplasm of prostate: Secondary | ICD-10-CM | POA: Diagnosis not present

## 2019-10-26 MED ORDER — VENLAFAXINE HCL ER 37.5 MG PO CP24: 37.5000 mg | ORAL_CAPSULE | Freq: Every day | ORAL | 1 refills | Status: AC

## 2019-10-27 ENCOUNTER — Telehealth: Payer: Self-pay | Admitting: Family Medicine

## 2019-10-27 ENCOUNTER — Encounter: Payer: Self-pay | Admitting: Urology

## 2019-10-27 ENCOUNTER — Other Ambulatory Visit: Payer: Self-pay | Admitting: Urology

## 2019-10-27 DIAGNOSIS — C61 Malignant neoplasm of prostate: Secondary | ICD-10-CM

## 2019-10-27 NOTE — Progress Notes (Signed)
10/26/2019 9:41 AM   [name] 02-03-1961 [id number]  Referring provider: Ceasar Mons, MD [place],  [place] [address] No chief complaint on file.   HPI: Daniel Weiss is a 59 y.o. male with a history of suspected metastatic prostate cancer who presents for transfer of care secondary to insurance coverage.  Was being followed at [facility].  He is accompanied by his daughter today via phone.    -Patient was diagnosed with prostate cancer on 10/2018 PSA was 509.  -Patient was initially seen by Dr. Alyson Ingles after incidentally found to have multiple sclerotic lesions on CT C/A/P along with a PSA of 502 during a work up for a CVA.  -Subsequent bone scan only identified a 9 mm area of abnormal uptake involving the left sacrum the patient was started on Lupron/casodex of  10/2018 and was eventually transition to eligard in 05/2019. -Prostate biopsy/tissue diagnosis never obtained -Patient was last seen at [facility] on 09/05/2019.  PSA was 13.3 which was nadir; testosterone <10 ng/dL -Casodex discontinued 08/2019 -Bothersome hot flashes -On tamsulosin for voiding symptoms -Repeat bone scan 10/06/2019 showed resolution of punctate activity over the left sacrum.  The punctate area of increased activity over the right lower lumbar spine was again noted -Recent injury to left hip; reports left hip pain x 1 week.    PSA trend: 11/03/2018: 502 11/22/2018: 377 02/21/2019: 387 04/25/2019: 123 09/05/2019: 13.3  PMH: Past Medical History:  Diagnosis Date   Abnormal chest CT 02/2018   a. Ectatic 3 cm Ao arch - rec f/u outpt imaging w/ CTA or MRA. Ectatic atheromatous abd Ao @ risk for aneurysm - rec f/u u/s in 5 yrs. Marked prostatic enlargement w/ scattered small sclerotic foci in the thoracic lower lumbar spine, sacrum, left 12th rib, pelvis, and right femur.  Recommend elective outpatient whole-body bone scintigraphy and PSA.    CAD (coronary artery disease)    a. 02/2018 ACS/PCI: LM nl, LAD 85p (3.5x15 Sierra DES), D1 45ost, RI 55ost, RCA nl, EF 25-35%.   Complete left bundle branch block (LBBB) 2016   Current every day smoker    Elevated PSA 11/2018   Enlarged prostate    HFrEF (heart failure with reduced ejection fraction) ([place])    Hypertension    Ischemic cardiomyopathy 02/2018   a. 02/2018 Echo: EF 35-40% (LV gram on cath was 25-30%), mod, diff HK. mid-apicalanteroseptal, ant, and apical sev HK. RVH.-->  No notable change on echo from June 2020 and August 2020.   Myocardial infarction [facility])    NSTEMI (non-ST elevated myocardial infarction) ([place]) 02/2018   Stroke [facility])    TIA (transient ischemic attack) 2016   Warthin's tumor     Surgical History: Past Surgical History:  Procedure Laterality Date   CORONARY STENT INTERVENTION N/A 03/06/2018   Procedure: CORONARY STENT INTERVENTION;  Surgeon: [name], MD;  Location: [facility];  Service: Cardiovascular: Proximal LAD 85% stenosis (and D1): DES PCI with Xience Sierra DES 3.5 mm x 15 mm - 3.9 mm   INTRAOPERATIVE TRANSTHORACIC ECHOCARDIOGRAM  03/06/2018   (Peri-MI) mild reduced EF 35-40%.  Diffuse hypokinesis (severe HK of the mid-apical anteroseptal, anterior and apical myocardium consistent with LAD infarct).  Unable to assess diastolic function.  Paradoxical septal motion likely related to his LBBB.  RV hypertrophy noted.  Trivial pericardial effusion noted.    LEFT HEART CATH AND CORONARY ANGIOGRAPHY N/A 03/06/2018   Procedure: LEFT HEART CATH AND  CORONARY ANGIOGRAPHY;  Surgeon: [name], MD;  Location: [facility];  Service: Cardiovascular: Proximal LAD 85% involving D1 45%.  Ostial RI 55%.  Severe LV dysfunction EF 25 to 30%.  1+ MR.  Only minimally elevated LVEDP.   TRANSTHORACIC ECHOCARDIOGRAM  10/2018   (Most recent echo, to evaluate for TIA/CVA) : Moderate reduced EF 35 to 40%.  Moderate concentric LVH.   Mild dyskinesis of the apex and severe hypokinesis of mid apical anteroseptal and anterior wall.  Unable to assess RV pressures.  Aortic sclerosis with no stenosis.  No significant change seen    Home Medications:  Allergies as of 10/26/2019   No Known Allergies     Medication List       Accurate as of October 26, 2019  9:41 AM. If you have any questions, ask your nurse or doctor.        albuterol 108 (90 Base) MCG/ACT inhaler Commonly known as: VENTOLIN HFA Inhale 2 puffs into the lungs every 6 (six) hours as needed for wheezing or shortness of breath.   bicalutamide 50 MG tablet Commonly known as: CASODEX Take 1 tablet by mouth twice daily   Entresto 24-26 MG Generic drug: sacubitril-valsartan Take 1/2 tablet in the morning and 1 tablet in the evening   furosemide 20 MG tablet Commonly known as: Lasix May take 20 mg tablet as needed for increase shortness of breath or swelling   metoprolol succinate 25 MG 24 hr tablet Commonly known as: TOPROL-XL Take 1/2 (one-half) tablet by mouth once daily   nitroGLYCERIN 0.4 MG SL tablet Commonly known as: Nitrostat Place 1 tablet (0.4 mg total) under the tongue every 5 (five) minutes as needed for chest pain.   rosuvastatin 40 MG tablet Commonly known as: CRESTOR Take 1 tablet by mouth once daily   tamsulosin 0.4 MG Caps capsule Commonly known as: FLOMAX Take 1 capsule (0.4 mg total) by mouth daily after supper.   warfarin 5 MG tablet Commonly known as: COUMADIN Take as directed by the anticoagulation clinic. If you are unsure how to take this medication, talk to your nurse or doctor. Original instructions: TAKE 1 TO 2 TABLETS BY MOUTH ONCE DAILY AS DIRECTED BY THE COUMADIN CLINIC       Allergies: No Known Allergies  Family History: Family History  Problem Relation Age of Onset   Stroke Mother    Hypertension Mother    Diabetes type II Mother    Leukemia Other    Breast cancer Other    Stroke Brother     CAD Neg Hx     Social History:  reports that he has been smoking cigarettes. He has been smoking about 0.50 packs per day. He has never used smokeless tobacco. He reports current alcohol use of about 2.0 standard drinks of alcohol per week. He reports current drug use. Frequency: 3.00 times per week. Drug: Marijuana.   Physical Exam: BP 92/66    Pulse 93    Ht 6' (1.829 m)    Wt 145 lb (65.8 kg)    BMI 19.67 kg/m   Constitutional:  Alert and oriented, No acute distress. HEENT: [place] AT, moist mucus membranes.  Trachea midline, no masses. Cardiovascular: No clubbing, cyanosis, or edema. Respiratory: Normal respiratory effort, no increased work of breathing. Skin: No rashes, bruises or suspicious lesions. Neurologic: Grossly intact, no focal deficits, moving all 4 extremities. Psychiatric: Normal mood and affect.  Laboratory Data:  Lab Results  Component Value Date  CREATININE 0.74 07/26/2019    Lab Results  Component Value Date   HGBA1C 5.2 05/23/2019     Assessment & Plan:    1.  Prostate cancer -Patient was previously followed by [facility].   -Patient was found to have multiple sclerotic lesions on CT C/A/P  11/03/2018 in the right femoral head, lumbar/thoracic spine and ribs along with a PSA of 502 during a work up for a CVA. Subsequent bone scan only identified a 9 mm area of abnormal uptake involving the left sacrum.   -Most recent PSA currently at nadir 13.3  -Discussed with patient and daughter that we do not have an advance prostate cancer clinic in our office and I have recommended medical oncology consultation regarding recommendations for tissue diagnosis and use of an androgen receptor inhibitor  -He has bothersome hot flashes and Rx venlafaxine sent to pharmacy  -Continue calcium and vitamin D. Continue leuprolide.   -Last Eligard 05/12/2019; he will be due for Eligard September 2021  Follow up in 6 months.  I spent 50 total minutes on the day of the  encounter including pre-visit review of the medical record, face-to-face time with the patient, and post visit ordering of labs/imaging/tests.   [facility] [address], [place], [place] [address] [phone]  I, Selena Batten, am acting as a scribe for Dr. Nicki Reaper C. Kaven Cumbie,  I have reviewed the above documentation for accuracy and completeness, and I agree with the above.   Abbie Sons, MD

## 2019-10-27 NOTE — Telephone Encounter (Signed)
LMOM for patient to return call and schedule an appointment to get an Eligard injection only. No PA required.

## 2019-10-28 NOTE — Telephone Encounter (Signed)
Spoke to patient and appointment made.

## 2019-10-31 ENCOUNTER — Ambulatory Visit (HOSPITAL_COMMUNITY)
Admission: RE | Admit: 2019-10-31 | Discharge: 2019-10-31 | Disposition: A | Discharge status: Home / Self Care | Payer: Medicaid Other | Source: Ambulatory Visit | Attending: Cardiology | Admitting: Cardiology

## 2019-10-31 ENCOUNTER — Other Ambulatory Visit: Payer: Self-pay

## 2019-10-31 DIAGNOSIS — I7410 Embolism and thrombosis of unspecified parts of aorta: Secondary | ICD-10-CM | POA: Diagnosis not present

## 2019-10-31 DIAGNOSIS — R0602 Shortness of breath: Secondary | ICD-10-CM | POA: Diagnosis present

## 2019-10-31 DIAGNOSIS — I255 Ischemic cardiomyopathy: Secondary | ICD-10-CM | POA: Diagnosis present

## 2019-10-31 MED ORDER — IOHEXOL 350 MG/ML SOLN
100.0000 mL | Freq: Once | INTRAVENOUS | Status: AC | PRN
  Administered 2019-10-31: 100 mL via INTRAVENOUS

## 2019-11-01 ENCOUNTER — Inpatient Hospital Stay: Payer: Medicaid Other

## 2019-11-01 ENCOUNTER — Inpatient Hospital Stay: Payer: Medicaid Other | Attending: Oncology | Admitting: Oncology

## 2019-11-01 ENCOUNTER — Encounter: Payer: Self-pay | Admitting: Oncology

## 2019-11-01 VITALS — BP 104/71 | HR 77 | Temp 97.9°F | Resp 16 | Ht 73.03 in | Wt 155.3 lb

## 2019-11-01 DIAGNOSIS — R972 Elevated prostate specific antigen [PSA]: Secondary | ICD-10-CM

## 2019-11-01 DIAGNOSIS — C61 Malignant neoplasm of prostate: Secondary | ICD-10-CM

## 2019-11-01 DIAGNOSIS — Z803 Family history of malignant neoplasm of breast: Secondary | ICD-10-CM

## 2019-11-01 DIAGNOSIS — Z806 Family history of leukemia: Secondary | ICD-10-CM | POA: Insufficient documentation

## 2019-11-01 DIAGNOSIS — Z79899 Other long term (current) drug therapy: Secondary | ICD-10-CM | POA: Diagnosis not present

## 2019-11-01 DIAGNOSIS — R9721 Rising PSA following treatment for malignant neoplasm of prostate: Secondary | ICD-10-CM | POA: Insufficient documentation

## 2019-11-01 DIAGNOSIS — F17210 Nicotine dependence, cigarettes, uncomplicated: Secondary | ICD-10-CM | POA: Diagnosis not present

## 2019-11-01 DIAGNOSIS — Z7289 Other problems related to lifestyle: Secondary | ICD-10-CM | POA: Diagnosis not present

## 2019-11-01 DIAGNOSIS — I513 Intracardiac thrombosis, not elsewhere classified: Secondary | ICD-10-CM | POA: Insufficient documentation

## 2019-11-01 DIAGNOSIS — Z7901 Long term (current) use of anticoagulants: Secondary | ICD-10-CM | POA: Insufficient documentation

## 2019-11-01 DIAGNOSIS — Z036 Encounter for observation for suspected toxic effect from ingested substance ruled out: Secondary | ICD-10-CM

## 2019-11-01 DIAGNOSIS — K118 Other diseases of salivary glands: Secondary | ICD-10-CM

## 2019-11-01 DIAGNOSIS — C7951 Secondary malignant neoplasm of bone: Secondary | ICD-10-CM | POA: Diagnosis not present

## 2019-11-01 DIAGNOSIS — F1290 Cannabis use, unspecified, uncomplicated: Secondary | ICD-10-CM | POA: Insufficient documentation

## 2019-11-01 LAB — COMPREHENSIVE METABOLIC PANEL
ALT: 31 U/L (ref 0–44)
AST: 44 U/L — ABNORMAL HIGH (ref 15–41)
Albumin: 3.8 g/dL (ref 3.5–5.0)
Alkaline Phosphatase: 78 U/L (ref 38–126)
Anion gap: 11 (ref 5–15)
BUN: 11 mg/dL (ref 6–20)
CO2: 25 mmol/L (ref 22–32)
Calcium: 9.2 mg/dL (ref 8.9–10.3)
Chloride: 102 mmol/L (ref 98–111)
Creatinine, Ser: 0.78 mg/dL (ref 0.61–1.24)
GFR calc Af Amer: >60 mL/min (ref >60)
GFR calc non Af Amer: >60 mL/min (ref >60)
Glucose, Bld: 107 mg/dL — ABNORMAL HIGH (ref 70–99)
Potassium: 4.3 mmol/L (ref 3.5–5.1)
Sodium: 138 mmol/L (ref 135–145)
Total Bilirubin: 0.5 mg/dL (ref 0.3–1.2)
Total Protein: 7.6 g/dL (ref 6.5–8.1)

## 2019-11-01 LAB — CBC WITH DIFFERENTIAL/PLATELET
Abs Immature Granulocytes: 0.01 10*3/uL (ref 0.00–0.07)
Basophils Absolute: 0 10*3/uL (ref 0.0–0.1)
Basophils Relative: 1 %
Eosinophils Absolute: 0 10*3/uL (ref 0.0–0.5)
Eosinophils Relative: 1 %
HCT: 35.5 % — ABNORMAL LOW (ref 39.0–52.0)
Hemoglobin: 12.2 g/dL — ABNORMAL LOW (ref 13.0–17.0)
Immature Granulocytes: 0 %
Lymphocytes Relative: 48 %
Lymphs Abs: 2.8 10*3/uL (ref 0.7–4.0)
MCH: 27.5 pg (ref 26.0–34.0)
MCHC: 34.4 g/dL (ref 30.0–36.0)
MCV: 80.1 fL (ref 80.0–100.0)
Monocytes Absolute: 0.6 10*3/uL (ref 0.1–1.0)
Monocytes Relative: 11 %
Neutro Abs: 2.2 10*3/uL (ref 1.7–7.7)
Neutrophils Relative %: 39 %
Platelets: 333 10*3/uL (ref 150–400)
RBC: 4.43 MIL/uL (ref 4.22–5.81)
RDW: 17.2 % — ABNORMAL HIGH (ref 11.5–15.5)
WBC: 5.7 10*3/uL (ref 4.0–10.5)
nRBC: 0 % (ref 0.0–0.2)

## 2019-11-01 LAB — PSA: Prostatic Specific Antigen: 5.45 ng/mL — ABNORMAL HIGH (ref 0.00–4.00)

## 2019-11-01 NOTE — Progress Notes (Signed)
New patient evaluation.  Left hip pain for the past 3 months.

## 2019-11-01 NOTE — Progress Notes (Signed)
Hematology/Oncology Consult note [facility] Telephone:([phone] [phone]   Patient Care Team: Azzie Glatter, FNP as PCP - General (Family Medicine) [name], MD as PCP - Cardiology (Cardiology) McKenzie, Candee Furbish, MD as Consulting Physician (Urology)  REFERRING PROVIDER: Abbie Sons, MD  CHIEF COMPLAINTS/REASON FOR VISIT:  Evaluation of prostate cancer  HISTORY OF PRESENTING ILLNESS:   Daniel Weiss is a  59 y.o.  male with PMH listed below was seen in consultation at the request of  Stoioff, Ronda Fairly, MD  for evaluation of presumed prostate cancer Patient recently switched his care to [facility] and was seen by Dr. [name]. He was previously followed up at [facility] urology I reviewed Dr. Dene Gentry note. Was diagnosed with presumed prostate cancer on 10/2018 with a PSA of 509, incidental findings of multiple sclerotic lesions on CT-chest abdomen pelvis during work-up for a CVA.  11/05/2018 bone scan showed solitary punctate focus of activity in the left first sacral segment corresponding to a 9 mm mixed lytic and sclerotic metastatic's on recent CT.  The remaining numerous sclerotic osseous metastasis identified on CT do not demonstrate abnormal activity and are therefore healed.  No tissue diagnosis/biopsy was obtained. [facility] on [facility]2019, PSA was 13.13, testosterone level less than 10. Casodex supposed to be discontinued in June 2021.  Still on his current list patient is not sure if he still taking it or not.  Patient has recent injury to left hip.   And repeat bone scan 10/06/2019 showed resolution of punctated activity over the left sacrum.  The punctate area of increased activity over the right lower lumbar spine again noted.  Patient has extensive cardiology issues.  He follows up with Dr. Ellyn Hack. Patient has history of STEMI-PCI to LAD with resultant ischemia cardiomyopathy, EF  35 to 40%, left bundle branch block with acute CVA-10/2018.  CT coronary showed nonobstructing plaquing of coronary distributions, intimal irregularity with filling defect in the ascending aorta but no LV thrombus.  And the patient is on warfarin and Plavix.  Patient was seen by thoracic surgeon Dr. Roxy Manns will recommend patient to be on Coumadin for minimum of 3 months.  With presentation with stroke, Dr. Ellyn Hack recommend at least 6 to 12 months of Coumadin  10/31/2019, CT angio chest aorta showed no evidence of aortic aneurysm or dissection.  Atherosclerotic calcification spleen seen in the upper abdominal aorta.  Chronic artery disease.  No acute cardiopulmonary disease. Patient has a chronic right neck mass.  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  He says that he cannot get operation on this mass due to the proximity to a vessel.  Patient lives with his sister.  He has 3 adult children.  His activity is quite limited due to shortness of breath with exertion due to cardiology problems.   Review of Systems  Constitutional: Negative for appetite change, chills, fatigue, fever and unexpected weight change.  HENT:   Negative for hearing loss and voice change.   Eyes: Negative for eye problems and icterus.  Respiratory: Positive for shortness of breath. Negative for chest tightness and cough.   Cardiovascular: Negative for chest pain and leg swelling.  Gastrointestinal: Negative for abdominal distention and abdominal pain.  Endocrine: Negative for hot flashes.  Genitourinary: Negative for difficulty urinating, dysuria and frequency.   Musculoskeletal: Negative for arthralgias.  Left hip pain  Skin: Negative for itching and rash.  Neurological: Negative for light-headedness and numbness.  Hematological: Negative for adenopathy. Does not bruise/bleed easily.  Psychiatric/Behavioral: Negative  for confusion.    MEDICAL HISTORY:  Past Medical History:  Diagnosis Date  . Abnormal chest CT 02/2018   a. Ectatic 3 cm Ao arch - rec f/u outpt imaging w/ CTA or MRA. Ectatic atheromatous abd Ao @ risk for aneurysm - rec f/u u/s in 5 yrs. Marked prostatic enlargement w/ scattered small sclerotic foci in the thoracic lower lumbar spine, sacrum, left 12th rib, pelvis, and right femur.  Recommend elective outpatient whole-body bone scintigraphy and PSA.  Marland Kitchen CAD (coronary artery disease)    a. 02/2018 ACS/PCI: LM nl, LAD 85p (3.5x15 Sierra DES), D1 45ost, RI 55ost, RCA nl, EF 25-35%.  . Complete left bundle branch block (LBBB) 2016  . Current every day smoker   . Elevated PSA 11/2018  . Enlarged prostate   . HFrEF (heart failure with reduced ejection fraction) ([place])   . Hypertension   . Ischemic cardiomyopathy 02/2018   a. 02/2018 Echo: EF 35-40% (LV gram on cath was 25-30%), mod, diff HK. mid-apicalanteroseptal, ant, and apical sev HK. RVH.-->  No notable change on echo from June 2020 and August 2020.  Marland Kitchen Myocardial infarction ([place])   . NSTEMI (non-ST elevated myocardial infarction) ([place]) 02/2018  . Stroke ([place])   . TIA (transient ischemic attack) 2016  . Warthin's tumor     SURGICAL HISTORY: Past Surgical History:  Procedure Laterality Date  . CORONARY STENT INTERVENTION N/A 03/06/2018   Procedure: CORONARY STENT INTERVENTION;  Surgeon: [name], MD;  Location: [place] [facility];  Service: Cardiovascular: Proximal LAD 85% stenosis (and D1): DES PCI with Xience Sierra DES 3.5 mm x 15 mm - 3.9 mm  . INTRAOPERATIVE TRANSTHORACIC ECHOCARDIOGRAM  03/06/2018   (Peri-MI) mild reduced EF 35-40%.  Diffuse hypokinesis (severe HK of the mid-apical anteroseptal, anterior and apical myocardium consistent with LAD infarct).  Unable to assess diastolic function.  Paradoxical septal motion likely related to his LBBB.  RV hypertrophy noted.  Trivial pericardial effusion noted.   Marland Kitchen LEFT HEART  CATH AND CORONARY ANGIOGRAPHY N/A 03/06/2018   Procedure: LEFT HEART CATH AND CORONARY ANGIOGRAPHY;  Surgeon: [name], MD;  Location: [place] [facility];  Service: Cardiovascular: Proximal LAD 85% involving D1 45%.  Ostial RI 55%.  Severe LV dysfunction EF 25 to 30%.  1+ MR.  Only minimally elevated LVEDP.  Marland Kitchen TRANSTHORACIC ECHOCARDIOGRAM  10/2018   (Most recent echo, to evaluate for TIA/CVA) : Moderate reduced EF 35 to 40%.  Moderate concentric LVH.  Mild dyskinesis of the apex and severe hypokinesis of mid apical anteroseptal and anterior wall.  Unable to assess RV pressures.  Aortic sclerosis with no stenosis.  No significant change seen    SOCIAL HISTORY: Social History   Socioeconomic History  . Marital status: Legally Separated    Spouse name: Dorothenia  . Number of children: Not on file  . Years of education: Not on file  . Highest education level: Not on file  Occupational History  . Occupation: cook  Tobacco Use  . Smoking status: Current Some Day Smoker    Packs/day: 0.50    Years: 10.00    Pack years: 5.00    Types: Cigarettes  . Smokeless tobacco: Never Used  Vaping Use  . Vaping Use: Never used  Substance and Sexual Activity  . Alcohol use: Yes  Alcohol/week: 2.0 standard drinks    Types: 2 Cans of beer per week    Comment: 2 Beers daily.  . Drug use: Yes    Frequency: 3.0 times per week    Types: Marijuana  . Sexual activity: Not Currently  Other Topics Concern  . Not on file  Social History Narrative  . Not on file   Social Determinants of Health   Financial Resource Strain:   . Difficulty of Paying Living Expenses: Not on file  Food Insecurity:   . Worried About Charity fundraiser in the Last Year: Not on file  . Ran Out of Food in the Last Year: Not on file  Transportation Needs: No Transportation Needs  . Lack of Transportation (Medical): No  . Lack of Transportation (Non-Medical): No  Physical Activity:   . Days of Exercise per Week:  Not on file  . Minutes of Exercise per Session: Not on file  Stress:   . Feeling of Stress : Not on file  Social Connections:   . Frequency of Communication with Friends and Family: Not on file  . Frequency of Social Gatherings with Friends and Family: Not on file  . Attends Religious Services: Not on file  . Active Member of Clubs or Organizations: Not on file  . Attends Archivist Meetings: Not on file  . Marital Status: Not on file  Intimate Partner Violence:   . Fear of Current or Ex-Partner: Not on file  . Emotionally Abused: Not on file  . Physically Abused: Not on file  . Sexually Abused: Not on file    FAMILY HISTORY: Family History  Problem Relation Age of Onset  . Stroke Mother   . Hypertension Mother   . Diabetes type II Mother   . Leukemia Other   . Breast cancer Other   . Stroke Brother   . CAD Neg Hx     ALLERGIES:  has No Known Allergies.  MEDICATIONS:  Current Outpatient Medications  Medication Sig Dispense Refill  . albuterol (VENTOLIN HFA) 108 (90 Base) MCG/ACT inhaler Inhale 2 puffs into the lungs every 6 (six) hours as needed for wheezing or shortness of breath. 8 g 11  . bicalutamide (CASODEX) 50 MG tablet Take 1 tablet by mouth twice daily 60 tablet 0  . furosemide (LASIX) 20 MG tablet May take 20 mg tablet as needed for increase shortness of breath or swelling (Patient taking differently: May take 20 mg tablet as needed for increase shortness of breath or swelling  1 QAM and 1/2 QPM) 20 tablet 6  . metoprolol succinate (TOPROL-XL) 25 MG 24 hr tablet Take 1/2 (one-half) tablet by mouth once daily 45 tablet 1  . nitroGLYCERIN (NITROSTAT) 0.4 MG SL tablet Place 1 tablet (0.4 mg total) under the tongue every 5 (five) minutes as needed for chest pain. 25 tablet 3  . rosuvastatin (CRESTOR) 40 MG tablet Take 1 tablet by mouth once daily 30 tablet 1  . sacubitril-valsartan (ENTRESTO) 24-26 MG Take 1/2 tablet in the morning and 1 tablet in the  evening 60 tablet 8  . tamsulosin (FLOMAX) 0.4 MG CAPS capsule Take 1 capsule (0.4 mg total) by mouth daily after supper. 90 capsule 3  . warfarin (COUMADIN) 5 MG tablet TAKE 1 TO 2 TABLETS BY MOUTH ONCE DAILY AS DIRECTED BY THE COUMADIN CLINIC 180 tablet 1  . ondansetron (ZOFRAN) 4 MG tablet Take 4 mg by mouth every 4 (four) hours as needed.    Marland Kitchen  venlafaxine XR (EFFEXOR XR) 37.5 MG 24 hr capsule Take 1 capsule (37.5 mg total) by mouth daily with breakfast. (Patient not taking: Reported on 11/01/2019) 30 capsule 1   No current facility-administered medications for this visit.     PHYSICAL EXAMINATION: ECOG PERFORMANCE STATUS: 1 - Symptomatic but completely ambulatory Vitals:   11/01/19 0920  BP: 104/71  Pulse: 77  Resp: 16  Temp: 97.9 F (36.6 C)   Filed Weights   11/01/19 0920  Weight: 155 lb 4.8 oz (70.4 kg)    Physical Exam Constitutional:      General: He is not in acute distress.    Comments: Thin built.   HENT:     Head: Normocephalic and atraumatic.  Eyes:     General: No scleral icterus. Neck:     Comments: Palpable right cervical mass Cardiovascular:     Rate and Rhythm: Normal rate and regular rhythm.     Heart sounds: Murmur heard.   Pulmonary:     Effort: Pulmonary effort is normal. No respiratory distress.     Breath sounds: No wheezing.  Abdominal:     General: Bowel sounds are normal. There is no distension.     Palpations: Abdomen is soft.  Musculoskeletal:        General: No deformity. Normal range of motion.     Cervical back: Normal range of motion and neck supple.  Skin:    General: Skin is warm and dry.     Findings: No erythema or rash.  Neurological:     Mental Status: He is alert and oriented to person, place, and time. Mental status is at baseline.     Cranial Nerves: No cranial nerve deficit.     Coordination: Coordination normal.  Psychiatric:        Mood and Affect: Mood normal.     LABORATORY DATA:  I have reviewed the data as  listed Lab Results  Component Value Date   WBC 5.7 11/01/2019   HGB 12.2 (L) 11/01/2019   HCT 35.5 (L) 11/01/2019   MCV 80.1 11/01/2019   PLT 333 11/01/2019   Recent Labs    02/02/19 0906 06/07/19 1253 07/26/19 1450 11/01/19 1021  NA 139  --  134* 138  K 4.9  --  4.5 4.3  CL 103  --  99 102  CO2 23  --  23 25  GLUCOSE 70  --  111* 107*  BUN 10  --  14 11  CREATININE 0.74*  --  0.74 0.78  CALCIUM 9.4  --  9.5 9.2  GFRNONAA 101  --  >60 >60  GFRAA 117  --  >60 >60  PROT 7.0 7.0  --  7.6  ALBUMIN 4.2 4.2  --  3.8  AST 17 91*  --  44*  ALT 8 69*  --  31  ALKPHOS 65 103  --  78  BILITOT 0.3 0.2  --  0.5  BILIDIR  --  0.10  --   --    Iron/TIBC/Ferritin/ %Sat No results found for: IRON, TIBC, FERRITIN, IRONPCTSAT    RADIOGRAPHIC STUDIES: I have personally reviewed the radiological images as listed and agreed with the findings in the report. NM Bone Scan Whole Body  Result Date: 10/07/2019 CLINICAL DATA:  Bilateral hip pain.  History of prostate cancer. EXAM: NUCLEAR MEDICINE WHOLE BODY BONE SCAN TECHNIQUE: Whole body anterior and posterior images were obtained approximately 3 hours after intravenous injection of radiopharmaceutical. RADIOPHARMACEUTICALS:  21.1 mCi Technetium-11m  MDP IV COMPARISON:  CT chest 07/26/2019.  Bone scan 11/05/2018. FINDINGS: Bilateral renal function excretion noted. Previously identified punctate area of increased activity noted over the left sacrum no longer identified. Punctate area of increased activity noted over the right lower lumbar spine is again noted. No new lesions are identified. IMPRESSION: 1. Previously identified punctate area of increased activity noted over the left sacrum no longer identified. Punctate area of increased activity noted over the right lower lumbar spine is again noted. 2.  No new lesions identified. Electronically Signed   By: Marcello Moores  Register   On: 10/07/2019 07:46   CT ANGIO CHEST AORTA W/CM & OR WO/CM  Result Date:  10/31/2019 CLINICAL DATA:  Shortness of breath. Aortic disease. Aortic mural thrombus. EXAM: CT ANGIOGRAPHY CHEST WITH CONTRAST TECHNIQUE: Multidetector CT imaging of the chest was performed using the standard protocol during bolus administration of intravenous contrast. Multiplanar CT image reconstructions and MIPs were obtained to evaluate the vascular anatomy. CONTRAST:  160mL OMNIPAQUE IOHEXOL 350 MG/ML SOLN COMPARISON:  07/26/2019 FINDINGS: Cardiovascular: Calcifications in the coronary arteries, most notable in the left anterior descending coronary artery. Few scattered aortic arch calcifications. No evidence of aortic aneurysm or dissection. Mediastinum/Nodes: No mediastinal, hilar, or axillary adenopathy. Trachea and esophagus are unremarkable. Thyroid unremarkable. Lungs/Pleura: Small subpleural right middle lobe nodule peripherally is stable dating back to 2019 compatible with benign nodule. No confluent opacities or suspicious nodules. No effusions. Upper Abdomen: Imaging into the upper abdomen demonstrates no acute findings. Calcifications in the upper abdominal aorta. No aneurysm. No acute findings. Musculoskeletal: Mild bilateral gynecomastia. Numerous scattered sclerotic lesions within the thoracic spine are stable since prior study. Review of the MIP images confirms the above findings. IMPRESSION: No evidence of aortic aneurysm or dissection. Atherosclerotic calcifications best seen in the upper abdominal aorta. Coronary artery disease. No acute cardiopulmonary disease. Electronically Signed   By: Rolm Baptise M.D.   On: 10/31/2019 23:22      ASSESSMENT & PLAN:  1. Elevated PSA   2. Prostate cancer ([place])   3. Parotid mass    #Sclerotic bone metastasis, elevation of PSA, presumed metastatic prostate cancer with bone metastasis, currently on Eligard, managed by Dr. Dene Gentry office. Reviewed patient's previous images. His PSA seems to respond to androgen deprivation therapy. Obtain abdomen  and pelvis CT. Discussed with patient about tissue diagnosis which will come from his prostate cancer diagnosis,-yield potential molecular targets for treatment of future. Awaiting CT abdomen pelvis.  Soft tissue biopsy is preferred.  If he only has bone metastasis, will discuss with Dr. [name] to see if patient can proceed with prostate biopsy. 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  -Once prostate cancer tissue diagnosis is established, may consider adding Xtandi or Abiateron along with androgen deprivation therapy.  Parotid mass, this is stable since 2016. Orders Placed This Encounter  Procedures  . CT ABDOMEN PELVIS W CONTRAST    Standing Status:   Future    Standing Expiration Date:   10/31/2020    Order Specific Question:   If indicated for the ordered procedure, I authorize the administration of contrast media per Radiology protocol    Answer:   Yes    Order Specific Question:   Preferred imaging location?    Answer:   [facility]    Order Specific Question:   Is Oral Contrast requested for this exam?  Answer:   Yes, Per Radiology protocol    Order Specific Question:   Radiology Contrast Protocol - do NOT remove file path    Answer:   [email or web address]  . CBC with Differential/Platelet    Standing Status:   Future    Number of Occurrences:   1    Standing Expiration Date:   10/31/2020  . Comprehensive metabolic panel    Standing Status:   Future    Number of Occurrences:   1    Standing Expiration Date:   10/31/2020  . PSA    Standing Status:   Future    Number of Occurrences:   1    Standing Expiration Date:   10/31/2020    All questions were answered. The patient knows to call the clinic with any problems questions or concerns.  cc Stoioff, Ronda Fairly, MD    Return of visit: To be  determined Thank you for this kind referral and the opportunity to participate in the care of this patient. A copy of today's note is routed to referring provider    Earlie Server, MD, PhD Hematology Oncology [facility] Pager- [phone] 11/01/2019

## 2019-11-02 ENCOUNTER — Telehealth: Payer: Self-pay

## 2019-11-02 NOTE — Telephone Encounter (Signed)
Called [place], NO PA required for Eligard. Ref# [id number].  Pt's [employer] ID [id number].

## 2019-11-04 ENCOUNTER — Ambulatory Visit: Payer: Self-pay

## 2019-11-11 ENCOUNTER — Ambulatory Visit (INDEPENDENT_AMBULATORY_CARE_PROVIDER_SITE_OTHER): Payer: Medicaid Other | Admitting: Family Medicine

## 2019-11-11 ENCOUNTER — Ambulatory Visit (INDEPENDENT_AMBULATORY_CARE_PROVIDER_SITE_OTHER): Payer: Medicaid Other

## 2019-11-11 ENCOUNTER — Other Ambulatory Visit: Payer: Self-pay

## 2019-11-11 DIAGNOSIS — Z8673 Personal history of transient ischemic attack (TIA), and cerebral infarction without residual deficits: Secondary | ICD-10-CM

## 2019-11-11 DIAGNOSIS — C61 Malignant neoplasm of prostate: Secondary | ICD-10-CM | POA: Diagnosis not present

## 2019-11-11 DIAGNOSIS — Z7901 Long term (current) use of anticoagulants: Secondary | ICD-10-CM

## 2019-11-11 LAB — POCT INR: INR: 1.2 — AB (ref 2.0–3.0)

## 2019-11-11 MED ORDER — LEUPROLIDE ACETATE (6 MONTH) 45 MG ~~LOC~~ KIT
45.0000 mg | PACK | Freq: Once | SUBCUTANEOUS | Status: AC
  Administered 2019-11-11: 45 mg via SUBCUTANEOUS

## 2019-11-11 NOTE — Patient Instructions (Signed)
Take 2 tablets today and then Continue with 10mg  daily except for 5mg  every Friday   Repeat INR in 2 weeks

## 2019-11-11 NOTE — Progress Notes (Signed)
Eligard SubQ Injection   Due to Prostate Cancer patient is present today for a Eligard Injection.  Medication: Eligard 64month Dose: 45 mg  Location: left  Lot: [id number] Exp: 04/2021  Patient tolerated well, no complications were noted  Performed [id number] Louretta Shorten, CMA  Per Dr. [name] patient is to continue therapy for 6 month . Patient's next follow up was scheduled for 05/10/2020. This appointment was scheduled using wheel and given to patient today along with reminder continue on Vitamin D 800-1000iu and Calium 1000-1200mg  daily while on Androgen Deprivation Therapy.

## 2019-11-18 ENCOUNTER — Telehealth: Payer: Self-pay

## 2019-11-18 ENCOUNTER — Other Ambulatory Visit: Payer: Self-pay

## 2019-11-18 ENCOUNTER — Ambulatory Visit
Admission: RE | Admit: 2019-11-18 | Discharge: 2019-11-18 | Disposition: A | Discharge status: Home / Self Care | Payer: Medicaid Other | Source: Ambulatory Visit | Attending: Oncology | Admitting: Oncology

## 2019-11-18 DIAGNOSIS — C61 Malignant neoplasm of prostate: Secondary | ICD-10-CM

## 2019-11-18 DIAGNOSIS — R972 Elevated prostate specific antigen [PSA]: Secondary | ICD-10-CM | POA: Insufficient documentation

## 2019-11-18 MED ORDER — IOHEXOL 300 MG/ML  SOLN
100.0000 mL | Freq: Once | INTRAMUSCULAR | Status: AC | PRN
  Administered 2019-11-18: 100 mL via INTRAVENOUS

## 2019-11-18 NOTE — Telephone Encounter (Signed)
-----   Message from Earlie Server, MD sent at 11/18/2019  3:34 PM EDT ----- Please arrange him to get CT guided biopsy of iliac lymph nodes, presumed prostate cancer.  He agrees. 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  Follow up 1 week after biopsy

## 2019-11-18 NOTE — Telephone Encounter (Signed)
Medical Clearance letter routed to Dr. Ellyn Hack via [place].

## 2019-11-21 NOTE — Telephone Encounter (Signed)
Also faxed to the office at [phone]

## 2019-11-22 ENCOUNTER — Other Ambulatory Visit: Payer: Self-pay

## 2019-11-22 ENCOUNTER — Telehealth: Payer: Self-pay | Admitting: Cardiology

## 2019-11-22 ENCOUNTER — Ambulatory Visit (INDEPENDENT_AMBULATORY_CARE_PROVIDER_SITE_OTHER): Payer: Medicaid Other | Admitting: Family Medicine

## 2019-11-22 VITALS — BP 147/93 | HR 81 | Temp 97.3°F | Resp 17 | Ht 72.0 in | Wt 145.2 lb

## 2019-11-22 DIAGNOSIS — I10 Essential (primary) hypertension: Secondary | ICD-10-CM

## 2019-11-22 NOTE — Telephone Encounter (Signed)
Called Dr. Allison Quarry office for medical clearance and they are going to fax clearance to our office.

## 2019-11-22 NOTE — Telephone Encounter (Signed)
   [facility] Pre-operative Risk Assessment    HEARTCARE STAFF: - Please ensure there is not already an duplicate clearance open for this procedure. - Under Visit Info/Reason for Call, type in Other and utilize the format Clearance MM/DD/YY or Clearance TBD. Do not use dashes or single digits. - If request is for dental extraction, please clarify the # of teeth to be extracted.  Request for surgical clearance:  1. What type of surgery is being performed? CT Biopsy of iliac lymph node  2. When is this surgery scheduled? TBD  3. What type of clearance is required (medical clearance vs. Pharmacy clearance to hold med vs. Both)? both  4. Are there any medications that need to be held prior to surgery and how long? Coumadin for 5 days prior with procedure being scheduled on the 5th day  5. Practice name and name of physician performing surgery? Radiologist at [facility], ordering physician is Dr. Tasia Catchings   6. What is the office phone number? [phone]   7.   What is the office fax number? [phone]  8.   Anesthesia type (None, local, MAC, general) ? Local if any is used   [employer] 11/22/2019, 1:30 PM  _________________________________________________________________   (provider comments below)

## 2019-11-22 NOTE — Telephone Encounter (Signed)
Patient previous had a aortic mural thrombus, he has complete 1 year of coumadin therapy. Most recent CTA of chest obtained on 8/23 showed resolution of mural thrombus, Dr. Ellyn Hack recommended transition coumadin to plavix. Last INR on 9/3 was subtherapeutic. Per patient, he is still taking the coumadin. He has upcoming visit on 9/17 with coumadin clinic to discuss stopping coumadin and start on plavix. Talking with the patient today, he denies any chest pain, however his functional ability is quite limited, and feels dyspneic walking only a short distance.   He has follow up with Dr. Ellyn Hack on 9/20.   1. Callback pool to addend follow up reason to include preop clearance.  2. Dr. Ellyn Hack, should the patient stop coumadin now?  3. Should the patient hold off on starting plavix pending CT biopsy?  Please send your response to P CV DIV PREOP  Thank you  Isaac Laud

## 2019-11-23 ENCOUNTER — Ambulatory Visit: Payer: Medicaid Other | Admitting: Family Medicine

## 2019-11-23 ENCOUNTER — Ambulatory Visit: Payer: Self-pay | Admitting: Pharmacist

## 2019-11-23 DIAGNOSIS — Z7901 Long term (current) use of anticoagulants: Secondary | ICD-10-CM

## 2019-11-23 DIAGNOSIS — Z8673 Personal history of transient ischemic attack (TIA), and cerebral infarction without residual deficits: Secondary | ICD-10-CM

## 2019-11-23 NOTE — Telephone Encounter (Signed)
Talked to Daniel Weiss this morning. Dr Ellyn Hack message to stop coumadin now given to patient.  He will call his oncologist to schedule biopsy ASAP, and follow up with DR Ellyn Hack on Monday Sept/20th.

## 2019-11-23 NOTE — Telephone Encounter (Signed)
He can stop Coumadin now (5 days prior to) if he needs to for the biopsy and then would probably start Plavix after biopsy.  Glenetta Hew, MD

## 2019-11-23 NOTE — Telephone Encounter (Signed)
Dr. Ellyn Hack was planning to advise patient to stop Warfarin on Friday and see him on Monday to discuss changing to Plavix. With the plan for biopsy soon Dr. Ellyn Hack went ahead and advised patient to stop the Warfarin yesterday.    Order for CT guided bx was faxed to specialty scheduling.

## 2019-11-24 NOTE — Telephone Encounter (Signed)
His Abdominal Aortic CTA looked OK - plan was to d/c Warfarin after 12 months (I.e. by this month 2021) -- OK to stop Warfarin.   Glenetta Hew, MD ..

## 2019-11-24 NOTE — Telephone Encounter (Signed)
I think he is > 1 yr out from his event - plan was to d/c warfarin after 12 months.  Glenetta Hew, MD

## 2019-11-28 ENCOUNTER — Ambulatory Visit (INDEPENDENT_AMBULATORY_CARE_PROVIDER_SITE_OTHER): Payer: Medicaid Other | Admitting: Cardiology

## 2019-11-28 ENCOUNTER — Encounter: Payer: Self-pay | Admitting: Cardiology

## 2019-11-28 ENCOUNTER — Other Ambulatory Visit: Payer: Self-pay

## 2019-11-28 ENCOUNTER — Other Ambulatory Visit: Payer: Self-pay | Admitting: Radiology

## 2019-11-28 VITALS — BP 134/80 | HR 92 | Ht 72.0 in | Wt 148.2 lb

## 2019-11-28 DIAGNOSIS — I447 Left bundle-branch block, unspecified: Secondary | ICD-10-CM | POA: Diagnosis not present

## 2019-11-28 DIAGNOSIS — R0600 Dyspnea, unspecified: Secondary | ICD-10-CM

## 2019-11-28 DIAGNOSIS — I10 Essential (primary) hypertension: Secondary | ICD-10-CM | POA: Diagnosis not present

## 2019-11-28 DIAGNOSIS — I25119 Atherosclerotic heart disease of native coronary artery with unspecified angina pectoris: Secondary | ICD-10-CM | POA: Diagnosis not present

## 2019-11-28 DIAGNOSIS — I7410 Embolism and thrombosis of unspecified parts of aorta: Secondary | ICD-10-CM | POA: Diagnosis not present

## 2019-11-28 DIAGNOSIS — R000 Tachycardia, unspecified: Secondary | ICD-10-CM

## 2019-11-28 DIAGNOSIS — R071 Chest pain on breathing: Secondary | ICD-10-CM

## 2019-11-28 DIAGNOSIS — I255 Ischemic cardiomyopathy: Secondary | ICD-10-CM

## 2019-11-28 DIAGNOSIS — R079 Chest pain, unspecified: Secondary | ICD-10-CM

## 2019-11-28 DIAGNOSIS — R0609 Other forms of dyspnea: Secondary | ICD-10-CM | POA: Insufficient documentation

## 2019-11-28 DIAGNOSIS — Z955 Presence of coronary angioplasty implant and graft: Secondary | ICD-10-CM

## 2019-11-28 DIAGNOSIS — Z720 Tobacco use: Secondary | ICD-10-CM

## 2019-11-28 DIAGNOSIS — I639 Cerebral infarction, unspecified: Secondary | ICD-10-CM

## 2019-11-28 DIAGNOSIS — E785 Hyperlipidemia, unspecified: Secondary | ICD-10-CM

## 2019-11-28 HISTORY — DX: Chest pain, unspecified: R07.9

## 2019-11-28 MED ORDER — CLOPIDOGREL BISULFATE 75 MG PO TABS: 75.0000 mg | ORAL_TABLET | Freq: Every day | ORAL | 3 refills | Status: DC

## 2019-11-28 MED ORDER — METOPROLOL SUCCINATE ER 25 MG PO TB24
25.0000 mg | ORAL_TABLET | Freq: Every day | ORAL | 1 refills | Status: DC
Start: 2019-11-28 — End: 2020-04-17

## 2019-11-28 NOTE — Telephone Encounter (Signed)
Done.. MD follow up 1 week after biopsy... Pt has been sched to RTC on 12/08/19 @ 9:45 Pt is aware of his sched appt.

## 2019-11-28 NOTE — Patient Instructions (Signed)
Medication Instructions:   increase taking  25 mg Metoprolol succinate ( toprol xl ) 25 mg at supper time  Stop taking Warfarin   After you have prostate biopsy start taking  Clopidogrel 75 mg one tablet daily   Try taking Entresto  Either way  You an Dr Ellyn Hack discuss (   1/2 tablet at night and whole tablet in the morning or 1/2 tablet in the morning , whole tablet at night )  One or the other.  *If you need a refill on your cardiac medications before your next appointment, please call your pharmacy*   Lab Work: Not needed    Testing/Procedures: [place] has requested that you have a [place]. Please follow instruction sheet, as given.    Follow-Up: At [facility], you and your health needs are our priority.  As part of our continuing mission to provide you with exceptional heart care, we have created designated Provider Care Teams.  These Care Teams include your primary Cardiologist (physician) and Advanced Practice Providers (APPs -  Physician Assistants and Nurse Practitioners) who all work together to provide you with the care you need, when you need it.  We recommend signing up for the patient portal called "MyChart".  Sign up information is provided on this After Visit Summary.  MyChart is used to connect with patients for Virtual Visits (Telemedicine).  Patients are able to view lab/test results, encounter notes, upcoming appointments, etc.  Non-urgent messages can be sent to your provider as well.   To learn more about what you can do with MyChart, go to [email or web address].    Your next appointment:   3 month(s)  The format for your next appointment:   In Person  Provider:   Glenetta Hew, MD   Other Instructions

## 2019-11-28 NOTE — Progress Notes (Signed)
Primary Care Provider: Azzie Glatter, FNP Cardiologist: Glenetta Hew, MD Electrophysiologist:   Clinic Note: Chief Complaint  Patient presents with  . Follow-up    69-month  . Coronary Artery Disease    Has had some chest pain  . Cardiomyopathy    Stable without any true heart failure symptoms.  . Pre-op Exam    Prostate biopsy    HPI:    Daniel Weiss is a 59 y.o. male with a PMH notable for anterior STEMI-PCI to LAD with resultant ICM (EF stable at 35 to 40%) and LEFT BUNDLE Sierra with recent CVA (10/2018 -> documented thoracic aortic thrombus)) who presents today for 40-month follow-up.   Complete LBBB -2016  Non-STEMI (December 2019): pLAD DES PCI, EF 25-35%  ICM - EF improved to 35-40% (not felt to be candidate for ICD; had hoped for CRTD b/c LBBB)  On low dose Entresto & Carvedilol (unable to titrate 2/2 hypotension).   10/2018 - CVA - descending TAA thrombus -- on warfarin x 1 yr (F/U CTA NEGATIVE for thrombus)  Dx with  metastatic prostate cancer at the time of his CVA.  Daniel Weiss was last seen on 3/222021 -> somewhat down emotionally.  Relatively stable from cardiac standpoint.  Some orthostatic lightheadedness and dizziness.  No residual stroke symptoms.  Depressed/dysthymic.  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  (Was on Casodex immunotherapy for his prostate cancer) poor appetite. -> Noted lightheadedness and dizziness mostly orthostatic.  Likely related to dehydration.  No real chest pain.  Easy fatigue.  Intermittent mild chest discomfort, but nothing significant.  Recent Hospitalizations: None  Reviewed  CV studies:    The following studies were reviewed today: (if available, images/films reviewed: From Epic Chart or Care Everywhere) . CT abdomen pelvis (11/18/2019): (Compared to 11/03/2018) lymphadenopathy in the retrocaval space and right common/external iliac chains compatible with  metastatic disease (stable-minimally progressed since prior study).  Numerous sclerotic bone lesions similar to prior study.  Stable 3 mm subpleural right middle lobe pulmonary nodule.  Aortic atherosclerosis. . CTA Chest (10/31/2019)- No TAA or dissection.  NO ATHEROMA. Mild calcifiction/atherosclerosis. . PE protocol CTA chest (07/26/2019): Negative for PE.  No acute pulmonary findings since 2019.  Stable benign right middle lobe nodule.  Numerous small sclerotic bone lesions and small some areas progressed since December 19.  Compatible with skeletal metastasis from prostate cancer   Interval History:   Daniel Weiss returns here today partly for preop evaluation for planned prostate biopsy.  We had cleared him to stop warfarin and go back to Plavix post biopsy, but this point was scheduled and therefore the biopsy was delayed till post visit.  Daniel Weiss is still really tired and fatigued.  Still not eating very well.  Daniel Weiss has had intermittent episodes of chest discomfort and took a nitroglycerin last week.  The spells are not associated with activity or exertion, usually occurring at rest when Daniel Weiss sitting in a couch.  There is no rhyme or reason to when the episodes occur.  Not really noticing any palpitations or irregular heartbeats.  Daniel Weiss is usually able to walk pretty well but does have some pretty significant dizziness and fatigue.  Daniel Weiss wants to get active, but just does not have a lot of energy.  Daniel Weiss is indicating a desire to want to try to quit smoking and asked about using nicotine patch.  Notes occasional blood when wiping, and has not  noticed significant constipation. Appetite is there, but not very strong.  Does not eat a lot.  Generalized weakness  CV Review of Symptoms (Summary) positive for - chest pain, dyspnea on exertion and Easily fatigued.  Lightheaded and dizzy but no syncope. ->  At least 2 episodes for which Daniel Weiss has taken nitroglycerin negative for - chest pain, edema, irregular  heartbeat, loss of consciousness, orthopnea, palpitations, paroxysmal nocturnal dyspnea, rapid heart rate, shortness of breath or Syncope/near syncope, TIA/amaurosis fugax, claudication.  The patient does not have symptoms concerning for COVID-19 infection (fever, chills, cough, or new shortness of breath).  The patient is practicing social distancing.  Still not gone back to work.  Hoping to try to do some rehab.  REVIEWED OF SYSTEMS   A comprehensive ROS was performed. Review of Systems  Constitutional: Positive for malaise/fatigue (Not really making any inroads toward increased energy level.) and weight loss (Just not eating well.).  HENT: Negative for congestion and nosebleeds.   Eyes: Negative for pain and discharge.  Respiratory: Positive for cough (Morning nonproductive cough.). Negative for shortness of breath (Baseline).   Cardiovascular: Negative for claudication and leg swelling.  Gastrointestinal: Positive for blood in stool (With wiping). Negative for abdominal pain and melena.  Genitourinary: Negative for frequency and hematuria.  Musculoskeletal: Negative for falls and joint pain.  Neurological: Positive for dizziness (Positional mostly, orthostatic) and headaches. Negative for focal weakness and weakness (Still somewhat weak but getting stronger).       Pretty stable following stroke  Endo/Heme/Allergies: Negative for environmental allergies. Does not bruise/bleed easily.  Psychiatric/Behavioral: Negative for depression and memory loss. The patient is nervous/anxious (Anxiety level is improving) and has insomnia.        Still overwhelmed at all reduce to 1 year with heart attack, cardiomyopathy, stroke and metastatic cancer    -Morning cough some shortness of breath with exertion.  Off-and-on abdominal pain, headaches.  Nervous and anxious about new diagnosis of cancer.  I have reviewed and (if needed) personally updated the patient's problem list, medications, allergies,  past medical and surgical history, social and family history.   PAST MEDICAL HISTORY   Past Medical History:  Diagnosis Date  . Abnormal chest CT 02/2018   a. Ectatic 3 cm Ao arch - rec f/u outpt imaging w/ CTA or MRA. Ectatic atheromatous abd Ao @ risk for aneurysm - rec f/u u/s in 5 yrs. Marked prostatic enlargement w/ scattered small sclerotic foci in the thoracic lower lumbar spine, sacrum, left 12th rib, pelvis, and right femur.  Recommend elective outpatient whole-body bone scintigraphy and PSA.  Marland Kitchen CAD (coronary artery disease)    a. 02/2018 ACS/PCI: LM nl, LAD 85p (3.5x15 Sierra DES), D1 45ost, RI 55ost, RCA nl, EF 25-35%.  . Complete left bundle branch block (LBBB) 2016  . Current every day smoker   . HFrEF (heart failure with reduced ejection fraction) ([place])   . Hypertension   . Ischemic cardiomyopathy 02/2018   a. 02/2018 Echo: EF 35-40% (LV gram on cath was 25-30%), mod, diff HK. mid-apicalanteroseptal, ant, and apical sev HK. RVH.-->  No notable change on echo from June 2020 and August 2020.  Marland Kitchen Myocardial infarction ([place])   . NSTEMI (non-ST elevated myocardial infarction) ([place]) 02/2018   85% pLAD - DES PCI   . Prostate cancer metastatic to multiple sites [facility]) 11/2018  . Stroke ([place])   . TIA (transient ischemic attack) 2016  . Warthin's tumor     Immunization History  Administered Date(s)  Administered  . PFIZER SARS-COV-2 Vaccination 06/30/2019, 08/01/2019  --> looking forward to booster  PAST SURGICAL HISTORY   Past Surgical History:  Procedure Laterality Date  . CORONARY STENT INTERVENTION N/A 03/06/2018   Procedure: CORONARY STENT INTERVENTION;  Surgeon: [name], MD;  Location: [facility];  Service: Cardiovascular: Proximal LAD 85% stenosis (and D1): DES PCI with Xience Sierra DES 3.5 mm x 15 mm - 3.9 mm  . INTRAOPERATIVE TRANSTHORACIC ECHOCARDIOGRAM  03/06/2018   (Peri-MI) mild reduced EF 35-40%.  Diffuse hypokinesis (severe HK of the mid-apical  anteroseptal, anterior and apical myocardium consistent with LAD infarct).  Unable to assess diastolic function.  Paradoxical septal motion likely related to his LBBB.  RV hypertrophy noted.  Trivial pericardial effusion noted.   Marland Kitchen LEFT HEART CATH AND CORONARY ANGIOGRAPHY N/A 03/06/2018   Procedure: LEFT HEART CATH AND CORONARY ANGIOGRAPHY;  Surgeon: [name], MD;  Location: [facility];  Service: Cardiovascular: Proximal LAD 85% involving D1 45%.  Ostial RI 55%.  Severe LV dysfunction EF 25 to 30%.  1+ MR.  Only minimally elevated LVEDP.  Marland Kitchen TRANSTHORACIC ECHOCARDIOGRAM  10/2018   (Most recent echo, to evaluate for TIA/CVA) : Moderate reduced EF 35 to 40%.  Moderate concentric LVH.  Mild dyskinesis of the apex and severe hypokinesis of mid apical anteroseptal and anterior wall.  Unable to assess RV pressures.  Aortic sclerosis with no stenosis.  No significant change seen    Cardiac Cath-PCI 03/06/2018: Proximal LAD 85% stenosis involving D1(DES PCI with Xience Sierra 3.5 mm x 15 mm-3.9 mm). Ostial RI 55%. Severe LV dysfunction with EF of 25-35%. 1+ MR. Minimally elevated LVEDP.Marland Kitchen    MEDICATIONS/ALLERGIES   Current Meds  Medication Sig  . albuterol (VENTOLIN HFA) 108 (90 Base) MCG/ACT inhaler Inhale 2 puffs into the lungs every 6 (six) hours as needed for wheezing or shortness of breath.  . bicalutamide (CASODEX) 50 MG tablet Take 1 tablet by mouth twice daily  . furosemide (LASIX) 20 MG tablet May take 20 mg tablet as needed for increase shortness of breath or swelling (Patient taking differently: May take 20 mg tablet as needed for increase shortness of breath or swelling  1 QAM and 1/2 QPM)  . metoprolol succinate (TOPROL-XL) 25 MG 24 hr tablet Take 1 tablet (25 mg total) by mouth daily with supper.  . nitroGLYCERIN (NITROSTAT) 0.4 MG SL tablet Place 1 tablet (0.4 mg total) under the tongue every 5 (five) minutes as needed for chest pain.  Marland Kitchen ondansetron (ZOFRAN) 4 MG  tablet Take 4 mg by mouth every 4 (four) hours as needed.  . rosuvastatin (CRESTOR) 40 MG tablet Take 1 tablet by mouth once daily  . sacubitril-valsartan (ENTRESTO) 24-26 MG Take 1/2 tablet in the morning and 1 tablet in the evening  . tamsulosin (FLOMAX) 0.4 MG CAPS capsule Take 1 capsule (0.4 mg total) by mouth daily after supper.  . venlafaxine XR (EFFEXOR XR) 37.5 MG 24 hr capsule Take 1 capsule (37.5 mg total) by mouth daily with breakfast.  . [DISCONTINUED] metoprolol succinate (TOPROL-XL) 25 MG 24 hr tablet Take 1/2 (one-half) tablet by mouth once daily  . [DISCONTINUED] warfarin (COUMADIN) 5 MG tablet TAKE 1 TO 2 TABLETS BY MOUTH ONCE DAILY AS DIRECTED BY THE COUMADIN CLINIC    No Known Allergies   SOCIAL HISTORY/FAMILY HISTORY   Social History   Tobacco Use  . Smoking status: Current Some Day Smoker    Packs/day: 0.00  Years: 0.00    Pack years: 0.00    Types: Cigarettes  . Smokeless tobacco: Never Used  . Tobacco comment: quit three days ago  Vaping Use  . Vaping Use: Never used  Substance Use Topics  . Alcohol use: Yes    Alcohol/week: 2.0 standard drinks    Types: 2 Cans of beer per week    Comment: 2 Beers daily.  . Drug use: Yes    Frequency: 3.0 times per week    Types: Marijuana   Social History   Social History Narrative  . Not on file    Family History family history includes Breast cancer in an other family member; Diabetes type II in his mother; Hypertension in his mother; Leukemia in an other family member; Stroke in his brother and mother.   OBJCTIVE -PE, EKG, labs   Wt Readings from Last 3 Encounters:  11/30/19 148 lb (67.1 kg)  11/28/19 148 lb 3.2 oz (67.2 kg)  11/22/19 145 lb 3.2 oz (65.9 kg)    Physical Exam: BP 134/80   Pulse 92   Ht 6' (1.829 m)   Wt 148 lb 3.2 oz (67.2 kg)   SpO2 99%   BMI 20.10 kg/m  Physical Exam Vitals reviewed.  Constitutional:      General: Daniel Weiss is not in acute distress.    Appearance: Daniel Weiss is  well-developed. Daniel Weiss is not toxic-appearing. Ill appearance: Mild.     Comments: Groomed, somewhat thin  HENT:     Head: Normocephalic and atraumatic.  Neck:     Vascular: No carotid bruit (No left sided bruit, but difficult to assess right because of mass) or JVD.     Comments: Right side mass stable. Cardiovascular:     Rate and Rhythm: Normal rate and regular rhythm. Occasional extrasystoles are present.    Chest Wall: PMI is not displaced.     Pulses: Intact distal pulses. No decreased pulses. A midsystolic click.     Heart sounds: Murmur heard.  Gallop present. S4 sounds present.   Pulmonary:     Effort: Pulmonary effort is normal. No respiratory distress.     Breath sounds: Normal breath sounds. No wheezing or rales.  Abdominal:     General: Bowel sounds are normal. There is no distension.     Palpations: Abdomen is soft. There is no mass.     Tenderness: There is no abdominal tenderness. There is no guarding or rebound.  Musculoskeletal:        General: No swelling. Normal range of motion.     Cervical back: Normal range of motion and neck supple.  Neurological:     General: No focal deficit present.     Mental Status: Daniel Weiss is alert and oriented to person, place, and time. Mental status is at baseline.  Psychiatric:        Mood and Affect: Mood normal.        Behavior: Behavior normal.        Thought Content: Thought content normal.        Judgment: Judgment normal.    Adult ECG Report  Rate: 92;  Rhythm: normal sinus rhythm and LVH with LBBB features of IVCD.  Inferior MI, age undetermined.  Cannot exclude ischemia with T wave inversions.;   Narrative Interpretation: Stable EKG  Recent Labs: On PCP labs, calculated LDL 67 Lab Results  Component Value Date   CHOL 133 06/07/2019   HDL 56 06/07/2019   LDLCALC 49 06/07/2019   TRIG 167 (  H) 06/07/2019   CHOLHDL 2.4 06/07/2019  Daniel Weiss recently restarted his statin Lab Results  Component Value Date   CREATININE 0.78 11/01/2019    BUN 11 11/01/2019   NA 138 11/01/2019   K 4.3 11/01/2019   CL 102 11/01/2019   CO2 25 11/01/2019    ASSESSMENT/PLAN    Problem List Items Addressed This Visit    Coronary artery disease involving native coronary artery of native heart with angina pectoris ([place]) - Primary (Chronic)    Two-vessel CAD with PCI to the LAD and PTCA of diagonal.  Little improvement of EF from initial PCI in the setting of non-STEMI.  Having intermittent episodes of chest discomfort which sounds more musculoskeletal, but relieved with nitroglycerin makes it somewhat concerning and in light of his upcoming procedures.    Patient with borderline blood pressures and orthostatic hypotension have limited titration of medications. As we have now stopped warfarin, Daniel Weiss will reinstitute Plavix.  Plan:  Myoview stress test -> LBBB makes treadmill non-diagnostic  Increase Toprol to 25 mg take nightly  Dose Entresto as best tolerated for dizziness either 1/2 tablet in the a.m. and full tablet in p.m. versus the opposite.  Continue statin  Following biopsy, start Plavix 75 mg daily        Relevant Medications   metoprolol succinate (TOPROL-XL) 25 MG 24 hr tablet   Other Relevant Orders   EKG 12-Lead (Completed)   MYOCARDIAL PERFUSION IMAGING   Ischemic cardiomyopathy (Chronic)    EF normalized out of 35 to 40%.  Despite LBBB, not felt to be within threshold for ICD.   Is pretty much on max tolerated doses of Entresto and Toprol however we will try to increase his Toprol 25 mg.  Daniel Weiss is taking the Entresto full tablet in the morning and 1/2 tablet in the evening.  Daniel Weiss is barely using his as needed Lasix.  Blood pressure would not tolerate spironolactone.      Relevant Medications   metoprolol succinate (TOPROL-XL) 25 MG 24 hr tablet   Aortic mural thrombus (HCC) (Chronic)    Follow-up CT angiogram did not show any evidence of thrombus.  We will therefore switch him from warfarin to Plavix.  DC  warfarin -> is already on hold for upcoming biopsy.  Post biopsy, will start back on Plavix at 75 mg daily  Blood pressure and lipid control. Smoking cessation counseling      Relevant Medications   metoprolol succinate (TOPROL-XL) 25 MG 24 hr tablet   Essential hypertension (Chronic)   Relevant Medications   metoprolol succinate (TOPROL-XL) 25 MG 24 hr tablet   Sinus tachycardia (Chronic)    Heart rate is fast again.--We will increase Toprol to 25 mg. Was previously on carvedilol, but blood pressure would not tolerate.      Relevant Orders   EKG 12-Lead (Completed)   MYOCARDIAL PERFUSION IMAGING   Tobacco abuse (Chronic)    We talked with him his about smoking cessation and options.  Daniel Weiss would like to try nicotine patches once Daniel Weiss gets through his biopsy.  Daniel Weiss knows Daniel Weiss needs to quit.  Just with all the stress of these new diagnosis, Daniel Weiss has had a hard time.  4 min      Hyperlipidemia LDL goal <70 (Chronic)    Labs from March show well controlled lipids on current dose of rosuvastatin.  Continue current dose.      Relevant Medications   metoprolol succinate (TOPROL-XL) 25 MG 24 hr tablet   Presence of  drug coated stent in LAD coronary artery (Chronic)    Has been switched to warfarin for aortic mural thrombus following a stroke--with the thrombus no longer present, we will convert back from warfarin to Plavix based on proximal LAD stent and existing disease..      Complete left bundle branch block (LBBB) (Chronic)    Need to monitor EF closely.   Where the EF is reduced below threshold, would refer back to EP to consider CRT-D      Relevant Medications   metoprolol succinate (TOPROL-XL) 25 MG 24 hr tablet   Other Relevant Orders   EKG 12-Lead (Completed)   MYOCARDIAL PERFUSION IMAGING   Acute cerebrovascular accident (CVA) ([place])   Relevant Medications   metoprolol succinate (TOPROL-XL) 25 MG 24 hr tablet   DOE (dyspnea on exertion)    This has been somewhat chronic,  but now the worst with some chest pain  Ischemic evaluation with Myoview      Relevant Orders   MYOCARDIAL PERFUSION IMAGING   Chest pain on exertion    Some features of this chest pain where typical while others were atypical.  With upcoming biopsy procedures etc., will we will check a Myoview just to ensure no ischemic changes.      Relevant Orders   MYOCARDIAL PERFUSION IMAGING    Other Visit Diagnoses    Chest pain on breathing       Relevant Orders   MYOCARDIAL PERFUSION IMAGING     I think with everything else going on, Daniel Weiss is reluctant to actually fully quit smoking.  This is 1 "pleasure".  COVID-19 Education: The signs and symptoms of COVID-19 were discussed with the patient and how to seek care for testing (follow up with PCP or arrange E-visit).   The importance of social distancing was discussed today.  I spent a total of 27 minutes with the patient spent in direct patient consultation.  Additional time spent with chart review (studies, outside notes, etc): 15 Total Time: 88min  Current medicines are reviewed at length with the patient today.  (+/- concerns) n/a   Patient Instructions / Medication Changes & Studies & Tests Ordered   Patient Instructions  Medication Instructions:   increase taking  25 mg Metoprolol succinate ( toprol xl ) 25 mg at supper time  Stop taking Warfarin   After you have prostate biopsy start taking  Clopidogrel 75 mg one tablet daily   Try taking Entresto  Either way  You an Dr Ellyn Hack discuss (   1/2 tablet at night and whole tablet in the morning or 1/2 tablet in the morning , whole tablet at night )  One or the other.  *If you need a refill on your cardiac medications before your next appointment, please call your pharmacy*   Lab Work: Not needed    Testing/Procedures: [facility] has requested that you have a [place]. Please follow instruction sheet, as  given.    Follow-Up: At [facility], you and your health needs are our priority.  As part of our continuing mission to provide you with exceptional heart care, we have created designated Provider Care Teams.  These Care Teams include your primary Cardiologist (physician) and Advanced Practice Providers (APPs -  Physician Assistants and Nurse Practitioners) who all work together to provide you with the care you need, when you need it.  We recommend signing up for the patient portal called "MyChart".  Sign up information is  provided on this After Visit Summary.  MyChart is used to connect with patients for Virtual Visits (Telemedicine).  Patients are able to view lab/test results, encounter notes, upcoming appointments, etc.  Non-urgent messages can be sent to your provider as well.   To learn more about what you can do with MyChart, go to [email or web address].    Your next appointment:   3 month(s)  The format for your next appointment:   In Person  Provider:   Glenetta Hew, MD   Other Instructions     Studies Ordered:   Orders Placed This Encounter  Procedures  . MYOCARDIAL PERFUSION IMAGING  . EKG 12-Lead     Glenetta Hew, M.D., M.S. Interventional Cardiologist   Pager # [phone] Phone # [phone] [address] [place], [place] [address]   Thank you for choosing Heartcare at [facility]!!

## 2019-11-28 NOTE — Telephone Encounter (Signed)
Informed patient that he is scheduled for biopsy on 11/30/2019 @ 1:30 to arrive at 12:30.  The IR nurse will call him prior to the procedure to go over the instructions.    Please schedule him for MD follow up 1 week after biopsy and call him with appt details.

## 2019-11-28 NOTE — Progress Notes (Signed)
Patient on schedule for CT Retroperitonel biopsy 11/30/2019,spoke with patient on phone and made aware to be here @ 1230, NPO after 0630, and driver post procedure/discharge. Stated understanding.

## 2019-11-29 ENCOUNTER — Other Ambulatory Visit: Payer: Self-pay | Admitting: Radiology

## 2019-11-30 ENCOUNTER — Ambulatory Visit
Admission: RE | Admit: 2019-11-30 | Discharge: 2019-11-30 | Disposition: A | Discharge status: Home / Self Care | Payer: Medicaid Other | Source: Ambulatory Visit | Attending: Oncology | Admitting: Oncology

## 2019-11-30 ENCOUNTER — Other Ambulatory Visit: Payer: Self-pay

## 2019-11-30 ENCOUNTER — Telehealth (HOSPITAL_COMMUNITY): Payer: Self-pay | Admitting: *Deleted

## 2019-11-30 ENCOUNTER — Encounter: Payer: Self-pay | Admitting: Cardiology

## 2019-11-30 DIAGNOSIS — C61 Malignant neoplasm of prostate: Secondary | ICD-10-CM | POA: Diagnosis not present

## 2019-11-30 DIAGNOSIS — Z8546 Personal history of malignant neoplasm of prostate: Secondary | ICD-10-CM | POA: Insufficient documentation

## 2019-11-30 DIAGNOSIS — F17210 Nicotine dependence, cigarettes, uncomplicated: Secondary | ICD-10-CM | POA: Insufficient documentation

## 2019-11-30 DIAGNOSIS — I252 Old myocardial infarction: Secondary | ICD-10-CM | POA: Diagnosis not present

## 2019-11-30 DIAGNOSIS — Z8673 Personal history of transient ischemic attack (TIA), and cerebral infarction without residual deficits: Secondary | ICD-10-CM | POA: Insufficient documentation

## 2019-11-30 DIAGNOSIS — I255 Ischemic cardiomyopathy: Secondary | ICD-10-CM | POA: Insufficient documentation

## 2019-11-30 DIAGNOSIS — R590 Localized enlarged lymph nodes: Secondary | ICD-10-CM | POA: Diagnosis present

## 2019-11-30 DIAGNOSIS — I5022 Chronic systolic (congestive) heart failure: Secondary | ICD-10-CM | POA: Insufficient documentation

## 2019-11-30 DIAGNOSIS — Z7901 Long term (current) use of anticoagulants: Secondary | ICD-10-CM | POA: Diagnosis not present

## 2019-11-30 DIAGNOSIS — I2510 Atherosclerotic heart disease of native coronary artery without angina pectoris: Secondary | ICD-10-CM | POA: Diagnosis not present

## 2019-11-30 DIAGNOSIS — Z79899 Other long term (current) drug therapy: Secondary | ICD-10-CM | POA: Insufficient documentation

## 2019-11-30 DIAGNOSIS — I110 Hypertensive heart disease with heart failure: Secondary | ICD-10-CM | POA: Diagnosis not present

## 2019-11-30 DIAGNOSIS — C772 Secondary and unspecified malignant neoplasm of intra-abdominal lymph nodes: Secondary | ICD-10-CM | POA: Insufficient documentation

## 2019-11-30 LAB — CBC
HCT: 35.1 % — ABNORMAL LOW (ref 39.0–52.0)
Hemoglobin: 12 g/dL — ABNORMAL LOW (ref 13.0–17.0)
MCH: 27.2 pg (ref 26.0–34.0)
MCHC: 34.2 g/dL (ref 30.0–36.0)
MCV: 79.6 fL — ABNORMAL LOW (ref 80.0–100.0)
Platelets: 264 10*3/uL (ref 150–400)
RBC: 4.41 MIL/uL (ref 4.22–5.81)
RDW: 16.7 % — ABNORMAL HIGH (ref 11.5–15.5)
WBC: 5.5 10*3/uL (ref 4.0–10.5)
nRBC: 0 % (ref 0.0–0.2)

## 2019-11-30 LAB — PROTIME-INR
INR: 1 (ref 0.8–1.2)
Prothrombin Time: 12.6 seconds (ref 11.4–15.2)

## 2019-11-30 MED ORDER — MIDAZOLAM HCL 2 MG/2ML IJ SOLN
INTRAMUSCULAR | Status: AC
  Filled 2019-11-30: qty 2

## 2019-11-30 MED ORDER — SODIUM CHLORIDE 0.9 % IV SOLN: INTRAVENOUS | Status: DC

## 2019-11-30 MED ORDER — MIDAZOLAM HCL 2 MG/2ML IJ SOLN
INTRAMUSCULAR | Status: AC | PRN
  Administered 2019-11-30 (×2): 1 mg via INTRAVENOUS

## 2019-11-30 MED ORDER — FENTANYL CITRATE (PF) 100 MCG/2ML IJ SOLN
INTRAMUSCULAR | Status: AC | PRN
  Administered 2019-11-30 (×2): 50 ug via INTRAVENOUS

## 2019-11-30 MED ORDER — FENTANYL CITRATE (PF) 100 MCG/2ML IJ SOLN
INTRAMUSCULAR | Status: DC
Start: 2019-11-30 — End: 2019-12-01
  Filled 2019-11-30: qty 2

## 2019-11-30 NOTE — Procedures (Signed)
Interventional Radiology Procedure Note  Procedure: CT guided retroperitoneal lymph node biopsy  Findings: Please refer to procedural dictation for full description. Technically successful 18 ga core biopsy x5 of right paracaval lymph node.  Complications: None immediate  Estimated Blood Loss: <10 mL  Recommendations: Bedrest for 1 hour. Follow up biopsy results.   Ruthann Cancer, MD

## 2019-11-30 NOTE — Telephone Encounter (Signed)
Patient given detailed instructions per Myocardial Perfusion Study Information Sheet for the test on 12/01/19 at 10:30. Patient notified to arrive 15 minutes early and that it is imperative to arrive on time for appointment to keep from having the test rescheduled.  If you need to cancel or reschedule your appointment, please call the office within 24 hours of your appointment. . Patient verbalized understanding.Daniel Weiss

## 2019-11-30 NOTE — H&P (Signed)
Chief Complaint: Patient was seen in consultation today for percutaneous lymph node biopsy at the request of Yu,Zhou.  Referring Physician(s): Yu,Zhou  Patient Status: [facility] - Out-pt  History of Present Illness: Daniel Weiss is a 59 y.o. male with recent diagnosis of prostate cancer with retroperitoneal and iliac lymphadenopathy.  He has ceased warfarin therapy.  INR pending. Feels well today with no complaints.  Past Medical History:  Diagnosis Date  . Abnormal chest CT 02/2018   a. Ectatic 3 cm Ao arch - rec f/u outpt imaging w/ CTA or MRA. Ectatic atheromatous abd Ao @ risk for aneurysm - rec f/u u/s in 5 yrs. Marked prostatic enlargement w/ scattered small sclerotic foci in the thoracic lower lumbar spine, sacrum, left 12th rib, pelvis, and right femur.  Recommend elective outpatient whole-body bone scintigraphy and PSA.  Marland Kitchen CAD (coronary artery disease)    a. 02/2018 ACS/PCI: LM nl, LAD 85p (3.5x15 Sierra DES), D1 45ost, RI 55ost, RCA nl, EF 25-35%.  . Complete left bundle branch block (LBBB) 2016  . Current every day smoker   . Elevated PSA 11/2018  . Enlarged prostate   . HFrEF (heart failure with reduced ejection fraction) ([place])   . Hypertension   . Ischemic cardiomyopathy 02/2018   a. 02/2018 Echo: EF 35-40% (LV gram on cath was 25-30%), mod, diff HK. mid-apicalanteroseptal, ant, and apical sev HK. RVH.-->  No notable change on echo from June 2020 and August 2020.  Marland Kitchen Myocardial infarction ([place])   . NSTEMI (non-ST elevated myocardial infarction) ([place] [place]) 02/2018  . Stroke ([place])   . TIA (transient ischemic attack) 2016  . Warthin's tumor     Past Surgical History:  Procedure Laterality Date  . CORONARY STENT INTERVENTION N/A 03/06/2018   Procedure: CORONARY STENT INTERVENTION;  Surgeon: [name], MD;  Location: [place] [facility];  Service: Cardiovascular: Proximal LAD 85% stenosis (and D1): DES PCI with Xience Sierra DES 3.5 mm x 15 mm - 3.9 mm  . INTRAOPERATIVE  TRANSTHORACIC ECHOCARDIOGRAM  03/06/2018   (Peri-MI) mild reduced EF 35-40%.  Diffuse hypokinesis (severe HK of the mid-apical anteroseptal, anterior and apical myocardium consistent with LAD infarct).  Unable to assess diastolic function.  Paradoxical septal motion likely related to his LBBB.  RV hypertrophy noted.  Trivial pericardial effusion noted.   Marland Kitchen LEFT HEART CATH AND CORONARY ANGIOGRAPHY N/A 03/06/2018   Procedure: LEFT HEART CATH AND CORONARY ANGIOGRAPHY;  Surgeon: [name], MD;  Location: [place];  Service: Cardiovascular: Proximal LAD 85% involving D1 45%.  Ostial RI 55%.  Severe LV dysfunction EF 25 to 30%.  1+ MR.  Only minimally elevated LVEDP.  Marland Kitchen TRANSTHORACIC ECHOCARDIOGRAM  10/2018   (Most recent echo, to evaluate for TIA/CVA) : Moderate reduced EF 35 to 40%.  Moderate concentric LVH.  Mild dyskinesis of the apex and severe hypokinesis of mid apical anteroseptal and anterior wall.  Unable to assess RV pressures.  Aortic sclerosis with no stenosis.  No significant change seen    Allergies: Patient has no known allergies.  Medications: Prior to Admission medications   Medication Sig Start Date End Date Taking? Authorizing Provider  albuterol (VENTOLIN HFA) 108 (90 Base) MCG/ACT inhaler Inhale 2 puffs into the lungs every 6 (six) hours as needed for wheezing or shortness of breath. 05/23/19  Yes Azzie Glatter, FNP  bicalutamide (CASODEX) 50 MG tablet Take 1 tablet by mouth twice daily 08/24/19  Yes McKenzie, Candee Furbish, MD  furosemide (LASIX)  20 MG tablet May take 20 mg tablet as needed for increase shortness of breath or swelling Patient taking differently: May take 20 mg tablet as needed for increase shortness of breath or swelling  1 QAM and 1/2 QPM 05/30/19  Yes [name], MD  metoprolol succinate (TOPROL-XL) 25 MG 24 hr tablet Take 1 tablet (25 mg total) by mouth daily with supper. 11/28/19  Yes [name], MD  nitroGLYCERIN (NITROSTAT) 0.4 MG  SL tablet Place 1 tablet (0.4 mg total) under the tongue every 5 (five) minutes as needed for chest pain. 03/10/18  Yes Theora Gianotti, NP  ondansetron (ZOFRAN) 4 MG tablet Take 4 mg by mouth every 4 (four) hours as needed. 10/16/19  Yes [provider]  rosuvastatin (CRESTOR) 40 MG tablet Take 1 tablet by mouth once daily 10/17/19  Yes [name], MD  sacubitril-valsartan [facility]) 24-26 MG Take 1/2 tablet in the morning and 1 tablet in the evening 05/30/19  Yes [name], MD  tamsulosin (FLOMAX) 0.4 MG CAPS capsule Take 1 capsule (0.4 mg total) by mouth daily after supper. 03/25/19  Yes [name], MD  venlafaxine XR (EFFEXOR XR) 37.5 MG 24 hr capsule Take 1 capsule (37.5 mg total) by mouth daily with breakfast. 10/26/19  Yes Stoioff, Ronda Fairly, MD  warfarin (COUMADIN) 5 MG tablet Take 5 mg by mouth daily.   Yes [provider]  clopidogrel (PLAVIX) 75 MG tablet Take 1 tablet (75 mg total) by mouth daily. Patient not taking: Reported on 11/30/2019 11/28/19   [name], MD     Family History  Problem Relation Age of Onset  . Stroke Mother   . Hypertension Mother   . Diabetes type II Mother   . Leukemia Other   . Breast cancer Other   . Stroke Brother   . CAD Neg Hx     Social History   Socioeconomic History  . Marital status: Legally Separated    Spouse name: Dorothenia  . Number of children: Not on file  . Years of education: Not on file  . Highest education level: Not on file  Occupational History  . Occupation: cook  Tobacco Use  . Smoking status: Current Some Day Smoker    Packs/day: 0.00    Years: 0.00    Pack years: 0.00    Types: Cigarettes  . Smokeless tobacco: Never Used  . Tobacco comment: quit three days ago  Vaping Use  . Vaping Use: Never used  Substance and Sexual Activity  . Alcohol use: Yes    Alcohol/week: 2.0 standard drinks    Types: 2 Cans of beer per week    Comment: 2 Beers daily.  . Drug use: Yes     Frequency: 3.0 times per week    Types: Marijuana  . Sexual activity: Not Currently  Other Topics Concern  . Not on file  Social History Narrative  . Not on file   Social Determinants of Health   Financial Resource Strain:   . Difficulty of Paying Living Expenses: Not on file  Food Insecurity:   . Worried About Charity fundraiser in the Last Year: Not on file  . Ran Out of Food in the Last Year: Not on file  Transportation Needs:   . Lack of Transportation (Medical): Not on file  . Lack of Transportation (Non-Medical): Not on file  Physical Activity:   . Days of Exercise per Week: Not on file  .  Minutes of Exercise per Session: Not on file  Stress:   . Feeling of Stress : Not on file  Social Connections:   . Frequency of Communication with Friends and Family: Not on file  . Frequency of Social Gatherings with Friends and Family: Not on file  . Attends Religious Services: Not on file  . Active Member of Clubs or Organizations: Not on file  . Attends Archivist Meetings: Not on file  . Marital Status: Not on file    Review of Systems  Constitutional: Negative for chills and fever.  HENT: Negative.   Eyes: Negative.   Respiratory: Negative for shortness of breath.   Cardiovascular: Negative for chest pain.  Gastrointestinal: Negative for abdominal pain.  Endocrine: Negative.   Genitourinary: Negative.   Musculoskeletal: Negative.   Skin: Negative.   Allergic/Immunologic: Negative.   Neurological: Negative.     Vital Signs: BP (!) 152/88   Pulse 72   Temp 98.1 F (36.7 C) (Oral)   Ht 6' (1.829 m)   Wt 67.1 kg   SpO2 100%   BMI 20.07 kg/m   Physical Exam Constitutional:      General: He is not in acute distress. HENT:     Head: Normocephalic.     Mouth/Throat:     Mouth: Mucous membranes are moist.     Comments: MP2 Neck:     Comments: Right neck mass  Cardiovascular:     Rate and Rhythm: Normal rate and regular rhythm.     Heart sounds: No  murmur heard.   Pulmonary:     Breath sounds: Normal breath sounds.  Abdominal:     General: There is no distension.  Skin:    General: Skin is warm and dry.  Neurological:     Mental Status: He is alert and oriented to person, place, and time.     Imaging: CT AP 11/18/19    Labs:  CBC: Recent Labs    02/02/19 0906 07/26/19 1450 11/01/19 1021 11/30/19 1304  WBC 6.4 7.1 5.7 5.5  HGB 11.0* 13.1 12.2* 12.0*  HCT 35.2* 39.1 35.5* 35.1*  PLT 460* 340 333 264    COAGS: Recent Labs    08/15/19 0948 10/14/19 1003 11/11/19 1122 11/30/19 1304  INR 2.1 2.1 1.2* 1.0    BMP: Recent Labs    02/02/19 0906 07/26/19 1450 11/01/19 1021  NA 139 134* 138  K 4.9 4.5 4.3  CL 103 99 102  CO2 23 23 25   GLUCOSE 70 111* 107*  BUN 10 14 11   CALCIUM 9.4 9.5 9.2  CREATININE 0.74* 0.74 0.78  GFRNONAA 101 >60 >60  GFRAA 117 >60 >60    LIVER FUNCTION TESTS: Recent Labs    02/02/19 0906 06/07/19 1253 11/01/19 1021  BILITOT 0.3 0.2 0.5  AST 17 91* 44*  ALT 8 69* 31  ALKPHOS 65 103 78  PROT 7.0 7.0 7.6  ALBUMIN 4.2 4.2 3.8    TUMOR MARKERS: No results for input(s): AFPTM, CEA, CA199, CHROMGRNA in the last 8760 hours.  Assessment and Plan:  59 year old male with history of recently diagnosed prostate cancer and right iliac and retroperitoneal lymphadenopathy.  Plan for percutaneous CT guided retroperitoneal lymph node biopsy with moderate sedation.  Thank you for this interesting consult.  I greatly enjoyed meeting Daniel Weiss and look forward to participating in their care.  A copy of this report was sent to the requesting provider on this date.  Electronically Signed: Rosanne Ashing  Orlandus Borowski, MD 11/30/2019, 1:54 PM   I spent a total of  15 Minutes  in face to face in clinical consultation, greater than 50% of which was counseling/coordinating care for lymph node biopsy.

## 2019-11-30 NOTE — Progress Notes (Signed)
Patient clinically stable post Ileac LN biopsy per Dr Serafina Royals, tolerated well. Awake/alert and oriented post procedure. Denies complaints at this time. Report given to Carlynn Spry RN with questions answered. Received Versed 2 Mg along with Fentanyl 153mcg IV for procedure.

## 2019-11-30 NOTE — Discharge Instructions (Signed)
Needle Biopsy, Care After This sheet gives you information about how to care for yourself after your procedure. Your health care provider may also give you more specific instructions. If you have problems or questions, contact your health care provider. What can I expect after the procedure? After the procedure, it is common to have soreness, bruising, or mild pain at the puncture site. This should go away in a few days. Follow these instructions at home: Needle insertion site care   Wash your hands with soap and water before you change your bandage (dressing). If you cannot use soap and water, use hand sanitizer.  Follow instructions from your health care provider about how to take care of your puncture site. This includes: ? When and how to change your dressing. ? When to remove your dressing.  Check your puncture site every day for signs of infection. Check for: ? Redness, swelling, or pain. ? Fluid or blood. ? Pus or a bad smell. ? Warmth. General instructions  Return to your normal activities as told by your health care provider. Ask your health care provider what activities are safe for you.  Do not take baths, swim, or use a hot tub until your health care provider approves. Ask your health care provider if you may take showers. You may only be allowed to take sponge baths.  Take over-the-counter and prescription medicines only as told by your health care provider.  Keep all follow-up visits as told by your health care provider. This is important. Contact a health care provider if:  You have a fever.  You have redness, swelling, or pain at the puncture site that lasts longer than a few days.  You have fluid, blood, or pus coming from your puncture site.  Your puncture site feels warm to the touch. Get help right away if:  You have severe bleeding from the puncture site. Summary  After the procedure, it is common to have soreness, bruising, or mild pain at the puncture  site. This should go away in a few days.  Check your puncture site every day for signs of infection, such as redness, swelling, or pain.  Get help right away if you have severe bleeding from your puncture site. This information is not intended to replace advice given to you by your health care provider. Make sure you discuss any questions you have with your health care provider. Document Revised: 05/08/2017 Document Reviewed: 03/09/2017 Elsevier Patient Education  2020 [employer]  

## 2019-12-01 ENCOUNTER — Encounter (HOSPITAL_COMMUNITY): Payer: Medicaid Other

## 2019-12-01 NOTE — Assessment & Plan Note (Signed)
Need to monitor EF closely.   Where the EF is reduced below threshold, would refer back to EP to consider CRT-D

## 2019-12-01 NOTE — Assessment & Plan Note (Addendum)
We talked with him his about smoking cessation and options.  He would like to try nicotine patches once he gets through his biopsy.  He knows he needs to quit.  Just with all the stress of these new diagnosis, he has had a hard time.  4 min

## 2019-12-01 NOTE — Assessment & Plan Note (Signed)
Some features of this chest pain where typical while others were atypical.  With upcoming biopsy procedures etc., will we will check a Myoview just to ensure no ischemic changes.

## 2019-12-01 NOTE — Assessment & Plan Note (Signed)
Labs from March show well controlled lipids on current dose of rosuvastatin.  Continue current dose.

## 2019-12-01 NOTE — Assessment & Plan Note (Signed)
Two-vessel CAD with PCI to the LAD and PTCA of diagonal.  Little improvement of EF from initial PCI in the setting of non-STEMI.  Having intermittent episodes of chest discomfort which sounds more musculoskeletal, but relieved with nitroglycerin makes it somewhat concerning and in light of his upcoming procedures.    Patient with borderline blood pressures and orthostatic hypotension have limited titration of medications. As we have now stopped warfarin, he will reinstitute Plavix.  Plan:  Myoview stress test -> LBBB makes treadmill non-diagnostic  Increase Toprol to 25 mg take nightly  Dose Entresto as best tolerated for dizziness either 1/2 tablet in the a.m. and full tablet in p.m. versus the opposite.  Continue statin  Following biopsy, start Plavix 75 mg daily

## 2019-12-01 NOTE — Assessment & Plan Note (Signed)
This has been somewhat chronic, but now the worst with some chest pain  Ischemic evaluation with Myoview

## 2019-12-01 NOTE — Assessment & Plan Note (Signed)
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

## 2019-12-01 NOTE — Assessment & Plan Note (Signed)
Follow-up CT angiogram did not show any evidence of thrombus.  We will therefore switch him from warfarin to Plavix.  DC warfarin -> is already on hold for upcoming biopsy.  Post biopsy, will start back on Plavix at 75 mg daily  Blood pressure and lipid control. Smoking cessation counseling

## 2019-12-01 NOTE — Assessment & Plan Note (Signed)
Heart rate is fast again.--We will increase Toprol to 25 mg. Was previously on carvedilol, but blood pressure would not tolerate.

## 2019-12-01 NOTE — Assessment & Plan Note (Signed)
EF normalized out of 35 to 40%.  Despite LBBB, not felt to be within threshold for ICD.   Is pretty much on max tolerated doses of Entresto and Toprol however we will try to increase his Toprol 25 mg.  He is taking the Entresto full tablet in the morning and 1/2 tablet in the evening.  He is barely using his as needed Lasix.  Blood pressure would not tolerate spironolactone.

## 2019-12-06 ENCOUNTER — Ambulatory Visit (HOSPITAL_COMMUNITY): Payer: Medicaid Other | Attending: Cardiovascular Disease

## 2019-12-06 ENCOUNTER — Other Ambulatory Visit: Payer: Self-pay

## 2019-12-06 DIAGNOSIS — R071 Chest pain on breathing: Secondary | ICD-10-CM | POA: Insufficient documentation

## 2019-12-06 DIAGNOSIS — R0600 Dyspnea, unspecified: Secondary | ICD-10-CM | POA: Insufficient documentation

## 2019-12-06 DIAGNOSIS — R079 Chest pain, unspecified: Secondary | ICD-10-CM | POA: Diagnosis present

## 2019-12-06 DIAGNOSIS — I447 Left bundle-branch block, unspecified: Secondary | ICD-10-CM | POA: Diagnosis present

## 2019-12-06 DIAGNOSIS — R000 Tachycardia, unspecified: Secondary | ICD-10-CM | POA: Insufficient documentation

## 2019-12-06 DIAGNOSIS — I25119 Atherosclerotic heart disease of native coronary artery with unspecified angina pectoris: Secondary | ICD-10-CM | POA: Diagnosis present

## 2019-12-06 DIAGNOSIS — R0609 Other forms of dyspnea: Secondary | ICD-10-CM

## 2019-12-06 LAB — MYOCARDIAL PERFUSION IMAGING
LV dias vol: 121 mL (ref 62–150)
LV sys vol: 79 mL
Peak HR: 130 {beats}/min
Rest HR: 82 {beats}/min
SDS: 3
SRS: 13
SSS: 20
TID: 0.86

## 2019-12-06 MED ORDER — REGADENOSON 0.4 MG/5ML IV SOLN
0.4000 mg | Freq: Once | INTRAVENOUS | Status: AC
  Administered 2019-12-06: 0.4 mg via INTRAVENOUS

## 2019-12-06 MED ORDER — TECHNETIUM TC 99M TETROFOSMIN IV KIT
32.3000 | PACK | Freq: Once | INTRAVENOUS | Status: AC | PRN
  Administered 2019-12-06: 32.3 via INTRAVENOUS
  Filled 2019-12-06: qty 33

## 2019-12-06 MED ORDER — TECHNETIUM TC 99M TETROFOSMIN IV KIT
10.4000 | PACK | Freq: Once | INTRAVENOUS | Status: AC | PRN
  Administered 2019-12-06: 10.4 via INTRAVENOUS
  Filled 2019-12-06: qty 11

## 2019-12-08 ENCOUNTER — Inpatient Hospital Stay: Payer: Medicaid Other | Attending: Oncology | Admitting: Oncology

## 2019-12-08 ENCOUNTER — Other Ambulatory Visit: Payer: Self-pay

## 2019-12-08 ENCOUNTER — Encounter: Payer: Self-pay | Admitting: Oncology

## 2019-12-08 VITALS — BP 149/93 | HR 77 | Temp 97.4°F | Resp 18 | Wt 146.6 lb

## 2019-12-08 DIAGNOSIS — C61 Malignant neoplasm of prostate: Secondary | ICD-10-CM | POA: Diagnosis not present

## 2019-12-08 DIAGNOSIS — I255 Ischemic cardiomyopathy: Secondary | ICD-10-CM | POA: Insufficient documentation

## 2019-12-08 DIAGNOSIS — C7951 Secondary malignant neoplasm of bone: Secondary | ICD-10-CM | POA: Insufficient documentation

## 2019-12-08 DIAGNOSIS — Z7901 Long term (current) use of anticoagulants: Secondary | ICD-10-CM | POA: Insufficient documentation

## 2019-12-08 DIAGNOSIS — Z803 Family history of malignant neoplasm of breast: Secondary | ICD-10-CM | POA: Insufficient documentation

## 2019-12-08 DIAGNOSIS — F17210 Nicotine dependence, cigarettes, uncomplicated: Secondary | ICD-10-CM | POA: Insufficient documentation

## 2019-12-08 DIAGNOSIS — I252 Old myocardial infarction: Secondary | ICD-10-CM | POA: Insufficient documentation

## 2019-12-08 DIAGNOSIS — K118 Other diseases of salivary glands: Secondary | ICD-10-CM

## 2019-12-08 DIAGNOSIS — K119 Disease of salivary gland, unspecified: Secondary | ICD-10-CM | POA: Diagnosis not present

## 2019-12-08 DIAGNOSIS — Z8673 Personal history of transient ischemic attack (TIA), and cerebral infarction without residual deficits: Secondary | ICD-10-CM | POA: Diagnosis not present

## 2019-12-08 DIAGNOSIS — Z7189 Other specified counseling: Secondary | ICD-10-CM

## 2019-12-08 NOTE — Progress Notes (Signed)
Pt here for follow up. No new concerns voiced.   

## 2019-12-08 NOTE — Progress Notes (Signed)
Hematology/Oncology follow up note [facility] Telephone:([phone] Fax:(336) [phone]   Patient Care Team: Azzie Glatter, FNP as PCP - General (Family Medicine) [name], MD as PCP - Cardiology (Cardiology) McKenzie, Candee Furbish, MD as Consulting Physician (Urology)  REFERRING PROVIDER: Azzie Glatter, FNP  CHIEF COMPLAINTS/REASON FOR VISIT:  Follow up for prostate cancer  HISTORY OF PRESENTING ILLNESS:   Daniel Weiss is a  59 y.o.  male with PMH listed below was seen in consultation at the request of  Azzie Glatter, FNP  for evaluation of presumed prostate cancer Patient recently switched his care to [facility] and was seen by Dr. [name]. He was previously followed up at [facility] urology I reviewed Dr. Dene Gentry note. Was diagnosed with presumed prostate cancer on 10/2018 with a PSA of 509, incidental findings of multiple sclerotic lesions on CT-chest abdomen pelvis during work-up for a CVA.  11/05/2018 bone scan showed solitary punctate focus of activity in the left first sacral segment corresponding to a 9 mm mixed lytic and sclerotic metastatic's on recent CT.  The remaining numerous sclerotic osseous metastasis identified on CT do not demonstrate abnormal activity and are therefore healed.  No tissue diagnosis/biopsy was obtained. [facility] 09/[facility]2019, PSA was 13.13, testosterone level less than 10. Casodex was discontinued in June 2021.    Patient has recent injury to left hip.   And repeat bone scan 10/06/2019 showed resolution of punctated activity over the left sacrum.  The punctate area of increased activity over the right lower lumbar spine again noted.  Patient has extensive cardiology issues.  He follows up with Dr. Ellyn Hack. Patient has history of STEMI-PCI to LAD with resultant ischemia cardiomyopathy, EF 35 to 40%, left bundle branch block with acute CVA-10/2018.  CT coronary showed  nonobstructing plaquing of coronary distributions, intimal irregularity with filling defect in the ascending aorta but no LV thrombus.  And the patient is on warfarin and Plavix.  Patient was seen by thoracic surgeon Dr. Roxy Manns will recommend patient to be on Coumadin for minimum of 3 months.  With presentation with stroke, Dr. Ellyn Hack recommend at least 6 to 12 months of Coumadin  10/31/2019, CT angio chest aorta showed no evidence of aortic aneurysm or dissection.  Atherosclerotic calcification spleen seen in the upper abdominal aorta.  Chronic artery disease.  No acute cardiopulmonary disease. Patient has a chronic right neck mass.  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  He says that he cannot get operation on this mass due to the proximity to a vessel.  Patient lives with his sister.  He has 3 adult children.  His activity is quite limited due to shortness of breath with exertion due to cardiology problems.  INTERVAL HISTORY Daniel Weiss is a 59 y.o. male who has above history reviewed by me today presents for follow up visit for management of presumed prostate cancer Problems and complaints are listed below: During interval, patient has had right pericaval lymph node biopsy.  Pathology showed metastatic malignant neoplasm, IHC for PSA is negative.  Additional staining or pending. Patient was previously on Coumadin anticoagulation.  Anticoagulation was stopped prior to the biopsy.  Patient reports that his cardiologist Dr. Ellyn Hack has seen him a new prescription for anticoagulation and he cannot remember the name.  He is going to check the name of  the medication and update me.  No new complaints.  Chronic shortness of breath with exertion.  Review of Systems  Constitutional: Negative for appetite change, chills, fatigue, fever and unexpected weight change.  HENT:   Negative for hearing loss and  voice change.   Eyes: Negative for eye problems and icterus.  Respiratory: Positive for shortness of breath. Negative for chest tightness and cough.   Cardiovascular: Negative for chest pain and leg swelling.  Gastrointestinal: Negative for abdominal distention and abdominal pain.  Endocrine: Negative for hot flashes.  Genitourinary: Negative for difficulty urinating, dysuria and frequency.   Musculoskeletal: Negative for arthralgias.       Left hip pain  Skin: Negative for itching and rash.  Neurological: Negative for light-headedness and numbness.  Hematological: Negative for adenopathy. Does not bruise/bleed easily.  Psychiatric/Behavioral: Negative for confusion.    MEDICAL HISTORY:  Past Medical History:  Diagnosis Date  . Abnormal chest CT 02/2018   a. Ectatic 3 cm Ao arch - rec f/u outpt imaging w/ CTA or MRA. Ectatic atheromatous abd Ao @ risk for aneurysm - rec f/u u/s in 5 yrs. Marked prostatic enlargement w/ scattered small sclerotic foci in the thoracic lower lumbar spine, sacrum, left 12th rib, pelvis, and right femur.  Recommend elective outpatient whole-body bone scintigraphy and PSA.  Marland Kitchen CAD (coronary artery disease)    a. 02/2018 ACS/PCI: LM nl, LAD 85p (3.5x15 Sierra DES), D1 45ost, RI 55ost, RCA nl, EF 25-35%.  . Complete left bundle branch block (LBBB) 2016  . Current every day smoker   . HFrEF (heart failure with reduced ejection fraction) ([place])   . Hypertension   . Ischemic cardiomyopathy 02/2018   a. 02/2018 Echo: EF 35-40% (LV gram on cath was 25-30%), mod, diff HK. mid-apicalanteroseptal, ant, and apical sev HK. RVH.-->  No notable change on echo from June 2020 and August 2020.  Marland Kitchen Myocardial infarction ([place])   . NSTEMI (non-ST elevated myocardial infarction) ([place]) 02/2018   85% pLAD - DES PCI   . Prostate cancer metastatic to multiple sites [facility]) 11/2018  . Stroke ([place])   . TIA (transient ischemic attack) 2016  . Warthin's tumor     SURGICAL HISTORY: Past  Surgical History:  Procedure Laterality Date  . CORONARY STENT INTERVENTION N/A 03/06/2018   Procedure: CORONARY STENT INTERVENTION;  Surgeon: [name], MD;  Location: [place] [facility];  Service: Cardiovascular: Proximal LAD 85% stenosis (and D1): DES PCI with Xience Sierra DES 3.5 mm x 15 mm - 3.9 mm  . INTRAOPERATIVE TRANSTHORACIC ECHOCARDIOGRAM  03/06/2018   (Peri-MI) mild reduced EF 35-40%.  Diffuse hypokinesis (severe HK of the mid-apical anteroseptal, anterior and apical myocardium consistent with LAD infarct).  Unable to assess diastolic function.  Paradoxical septal motion likely related to his LBBB.  RV hypertrophy noted.  Trivial pericardial effusion noted.   Marland Kitchen LEFT HEART CATH AND CORONARY ANGIOGRAPHY N/A 03/06/2018   Procedure: LEFT HEART CATH AND CORONARY ANGIOGRAPHY;  Surgeon: [name], MD;  Location: [place] [facility];  Service: Cardiovascular: Proximal LAD 85% involving D1 45%.  Ostial RI 55%.  Severe LV dysfunction EF 25 to 30%.  1+ MR.  Only minimally elevated LVEDP.  Marland Kitchen TRANSTHORACIC ECHOCARDIOGRAM  10/2018   (Most recent echo, to evaluate for TIA/CVA) : Moderate reduced EF 35 to 40%.  Moderate concentric LVH.  Mild dyskinesis of the apex and severe hypokinesis of mid apical anteroseptal and anterior wall.  Unable to assess RV pressures.  Aortic  sclerosis with no stenosis.  No significant change seen    SOCIAL HISTORY: Social History   Socioeconomic History  . Marital status: Legally Separated    Spouse name: Dorothenia  . Number of children: Not on file  . Years of education: Not on file  . Highest education level: Not on file  Occupational History  . Occupation: cook  Tobacco Use  . Smoking status: Current Some Day Smoker    Packs/day: 0.00    Years: 0.00    Pack years: 0.00    Types: Cigarettes  . Smokeless tobacco: Never Used  . Tobacco comment: quit three days ago  Vaping Use  . Vaping Use: Never used  Substance and Sexual Activity  . Alcohol  use: Yes    Alcohol/week: 2.0 standard drinks    Types: 2 Cans of beer per week    Comment: 2 Beers daily.  . Drug use: Yes    Frequency: 3.0 times per week    Types: Marijuana  . Sexual activity: Not Currently  Other Topics Concern  . Not on file  Social History Narrative  . Not on file   Social Determinants of Health   Financial Resource Strain:   . Difficulty of Paying Living Expenses: Not on file  Food Insecurity:   . Worried About Charity fundraiser in the Last Year: Not on file  . Ran Out of Food in the Last Year: Not on file  Transportation Needs:   . Lack of Transportation (Medical): Not on file  . Lack of Transportation (Non-Medical): Not on file  Physical Activity:   . Days of Exercise per Week: Not on file  . Minutes of Exercise per Session: Not on file  Stress:   . Feeling of Stress : Not on file  Social Connections:   . Frequency of Communication with Friends and Family: Not on file  . Frequency of Social Gatherings with Friends and Family: Not on file  . Attends Religious Services: Not on file  . Active Member of Clubs or Organizations: Not on file  . Attends Archivist Meetings: Not on file  . Marital Status: Not on file  Intimate Partner Violence:   . Fear of Current or Ex-Partner: Not on file  . Emotionally Abused: Not on file  . Physically Abused: Not on file  . Sexually Abused: Not on file    FAMILY HISTORY: Family History  Problem Relation Age of Onset  . Stroke Mother   . Hypertension Mother   . Diabetes type II Mother   . Leukemia Other   . Breast cancer Other   . Stroke Brother   . CAD Neg Hx     ALLERGIES:  has No Known Allergies.  MEDICATIONS:  Current Outpatient Medications  Medication Sig Dispense Refill  . albuterol (VENTOLIN HFA) 108 (90 Base) MCG/ACT inhaler Inhale 2 puffs into the lungs every 6 (six) hours as needed for wheezing or shortness of breath. 8 g 11  . bicalutamide (CASODEX) 50 MG tablet Take 1 tablet by  mouth twice daily 60 tablet 0  . clopidogrel (PLAVIX) 75 MG tablet Take 1 tablet (75 mg total) by mouth daily. 90 tablet 3  . furosemide (LASIX) 20 MG tablet May take 20 mg tablet as needed for increase shortness of breath or swelling (Patient taking differently: May take 20 mg tablet as needed for increase shortness of breath or swelling  1 QAM and 1/2 QPM) 20 tablet 6  . metoprolol succinate (TOPROL-XL)  25 MG 24 hr tablet Take 1 tablet (25 mg total) by mouth daily with supper. 45 tablet 1  . ondansetron (ZOFRAN) 4 MG tablet Take 4 mg by mouth every 4 (four) hours as needed.    . rosuvastatin (CRESTOR) 40 MG tablet Take 1 tablet by mouth once daily 30 tablet 1  . sacubitril-valsartan (ENTRESTO) 24-26 MG Take 1/2 tablet in the morning and 1 tablet in the evening 60 tablet 8  . tamsulosin (FLOMAX) 0.4 MG CAPS capsule Take 1 capsule (0.4 mg total) by mouth daily after supper. 90 capsule 3  . venlafaxine XR (EFFEXOR XR) 37.5 MG 24 hr capsule Take 1 capsule (37.5 mg total) by mouth daily with breakfast. 30 capsule 1  . warfarin (COUMADIN) 5 MG tablet Take 5 mg by mouth daily.    . nitroGLYCERIN (NITROSTAT) 0.4 MG SL tablet Place 1 tablet (0.4 mg total) under the tongue every 5 (five) minutes as needed for chest pain. (Patient not taking: Reported on 12/08/2019) 25 tablet 3   No current facility-administered medications for this visit.     PHYSICAL EXAMINATION: ECOG PERFORMANCE STATUS: 1 - Symptomatic but completely ambulatory Vitals:   12/08/19 0950  BP: (!) 149/93  Pulse: 77  Resp: 18  Temp: (!) 97.4 F (36.3 C)   Filed Weights   12/08/19 0950  Weight: 146 lb 9.6 oz (66.5 kg)    Physical Exam Constitutional:      General: He is not in acute distress.    Comments: Thin built.   HENT:     Head: Normocephalic and atraumatic.  Eyes:     General: No scleral icterus. Neck:     Comments: Palpable right cervical mass Cardiovascular:     Rate and Rhythm: Normal rate and regular rhythm.      Heart sounds: Murmur heard.   Pulmonary:     Effort: Pulmonary effort is normal. No respiratory distress.     Breath sounds: No wheezing.  Abdominal:     General: Bowel sounds are normal. There is no distension.     Palpations: Abdomen is soft.  Musculoskeletal:        General: No deformity. Normal range of motion.     Cervical back: Normal range of motion and neck supple.  Skin:    General: Skin is warm and dry.     Findings: No erythema or rash.  Neurological:     Mental Status: He is alert and oriented to person, place, and time. Mental status is at baseline.     Cranial Nerves: No cranial nerve deficit.     Coordination: Coordination normal.  Psychiatric:        Mood and Affect: Mood normal.     LABORATORY DATA:  I have reviewed the data as listed Lab Results  Component Value Date   WBC 5.5 11/30/2019   HGB 12.0 (L) 11/30/2019   HCT 35.1 (L) 11/30/2019   MCV 79.6 (L) 11/30/2019   PLT 264 11/30/2019   Recent Labs    02/02/19 0906 06/07/19 1253 07/26/19 1450 11/01/19 1021  NA 139  --  134* 138  K 4.9  --  4.5 4.3  CL 103  --  99 102  CO2 23  --  23 25  GLUCOSE 70  --  111* 107*  BUN 10  --  14 11  CREATININE 0.74*  --  0.74 0.78  CALCIUM 9.4  --  9.5 9.2  GFRNONAA 101  --  >60 >60  GFRAA 117  --  >  60 >60  PROT 7.0 7.0  --  7.6  ALBUMIN 4.2 4.2  --  3.8  AST 17 91*  --  44*  ALT 8 69*  --  31  ALKPHOS 65 103  --  78  BILITOT 0.3 0.2  --  0.5  BILIDIR  --  0.10  --   --    Iron/TIBC/Ferritin/ %Sat No results found for: IRON, TIBC, FERRITIN, IRONPCTSAT    RADIOGRAPHIC STUDIES: I have personally reviewed the radiological images as listed and agreed with the findings in the report. CT ABDOMEN PELVIS W CONTRAST  Result Date: 11/18/2019 CLINICAL DATA:  New diagnosis of prostate cancer. EXAM: CT ABDOMEN AND PELVIS WITH CONTRAST TECHNIQUE: Multidetector CT imaging of the abdomen and pelvis was performed using the standard protocol following bolus  administration of intravenous contrast. CONTRAST:  158m OMNIPAQUE IOHEXOL 300 MG/ML  SOLN COMPARISON:  CTA chest abdomen pelvis 11/03/2018 FINDINGS: Lower chest: 3 mm subpleural right middle lobe pulmonary nodule stable since prior study consistent with benign etiology. Hepatobiliary: Scattered tiny hepatic low-density lesions are too small to characterize but stable since prior study, consistent with benign etiology such as cyst. Index lesion measured previously at 12 mm is stable at 12 mm today (28/2). There is no evidence for gallstones, gallbladder wall thickening, or pericholecystic fluid. No intrahepatic or extrahepatic biliary dilation. Pancreas: No focal mass lesion. No dilatation of the main duct. No intraparenchymal cyst. No peripancreatic edema. Spleen: No splenomegaly. No focal mass lesion. Adrenals/Urinary Tract: No adrenal nodule or mass. No suspicious renal abnormality. Tiny hypodensities in each kidney are too small to characterize but likely benign. No evidence for hydroureter. The urinary bladder appears normal for the degree of distention. Stomach/Bowel: Stomach is unremarkable. No gastric wall thickening. No evidence of outlet obstruction. Duodenum is normally positioned as is the ligament of Treitz. No small bowel wall thickening. No small bowel dilatation. The terminal ileum is normal. The appendix is normal. No gross colonic mass. No colonic wall thickening. Vascular/Lymphatic: There is abdominal aortic atherosclerosis without aneurysm. No gastrohepatic or hepatoduodenal ligament lymphadenopathy. 18 mm short axis retrocaval lymph node identified on image 39/series 2. 11 mm short axis common iliac node identified on image 54/series 2. 14 mm short axis right external iliac node is identified on 65/2. these lymph nodes were present on the previous study and are only minimally progressed, for example the 14 mm right external iliac node was 13 mm short axis on the previous exam. Reproductive:  Prostate gland is not substantially enlarged. Other: No intraperitoneal free fluid. Musculoskeletal: Numerous sclerotic bone lesions are again identified, similar to prior study. 5 mm sclerotic lesion in the left T12 vertebral body is stable compared to prior study. IMPRESSION: 1. lymphadenopathy in the retrocaval space and right common and external iliac chains. This is compatible with metastatic disease and is stable to minimally progressed in the interval since the prior study. 2. Numerous sclerotic bone lesions, similar to prior study. 3. Stable 3 mm subpleural right middle lobe pulmonary nodule since prior study consistent with benign etiology such as scarring. 4. Aortic Atherosclerosis (ICD10-I70.0). Electronically Signed   By: EMisty StanleyM.D.   On: 11/18/2019 09:04   CT Biopsy  Result Date: 12/01/2019 INDICATION: 59year old male with history of prostate cancer and right inguinal and retroperitoneal lymphadenopathy. EXAM: CT BIOPSY COMPARISON:  CT abdomen pelvis from 11/18/2019 MEDICATIONS: None. ANESTHESIA/SEDATION: Fentanyl 100 mcg IV; Versed 2 mg IV Sedation time: 17 minutes; The patient was continuously monitored during the  procedure by the interventional radiology nurse under my direct supervision. CONTRAST:  None. COMPLICATIONS: None immediate. PROCEDURE: Informed consent was obtained from the patient following an explanation of the procedure, risks, benefits and alternatives. A time out was performed prior to the initiation of the procedure. The patient was positioned prone on the CT table and a limited CT was performed for procedural planning demonstrating similar appearance of an enlarged pericaval retroperitoneal lymph node measuring up to 14 mm in short axis on axial plane. The procedure was planned. The operative site was prepped and draped in the usual sterile fashion. Appropriate trajectory was confirmed with a 22 gauge spinal needle after the adjacent tissues were anesthetized with 1%  Lidocaine with epinephrine. Under intermittent CT guidance, a 17 gauge coaxial needle was advanced into the peripheral aspect of the mass. Appropriate positioning was confirmed and a total of 5 samples were obtained with an 18 gauge core needle biopsy device. The co-axial needle was removed and hemostasis was achieved with manual compression. A limited postprocedural CT was negative for hemorrhage or additional complication. A dressing was placed. The patient tolerated the procedure well without immediate postprocedural complication. IMPRESSION: Technically successful CT guided core needle biopsy of right pericaval retroperitoneal enlarged lymph node. Electronically Signed   By: Ruthann Cancer MD   On: 12/01/2019 07:57   MYOCARDIAL PERFUSION IMAGING  Result Date: 12/06/2019  The left ventricular ejection fraction is moderately decreased (30-44%).  Nuclear stress EF: 35%.  Defect 1: There is a large defect of moderate severity present in the mid anterior, mid anteroseptal, apical anterior, apical septal, apical inferior and apex location.  Findings consistent with prior myocardial infarction. No ischemia.  This is an intermediate risk study due to reduced systolic function.       ASSESSMENT & PLAN:  1. Prostate cancer ([place])   2. Parotid mass   3. Goals of care, counseling/discussion    #Sclerotic bone metastasis, elevation of PSA, presumed metastatic prostate cancer with bone metastasis, currently on Eligard, managed by Dr. Dene Gentry office.  Last Eligard was given in September 2021. Paracaval lymph node biopsy showed metastatic neoplasm, additional stainings are pending.  Clinically he has stage IV prostate cancer. castration naive vs resistant.  I independently reviewed his recent images.  Bone scan showed no new lesions. And repeat CT abdomen pelvis showed slightly progressed size of retrocaval space and right common and external iliac chains. - possible castration resistant disease.    Continue ADT, monitor PSA. Goals of care was discussed, palliative intent.  Discussed about treatment plan with him.  He has multiple medical problems, TIA, Stroke, NSTEMI, HTN, CHF, LBBB,  tobacco abuse, PS 1-2, I recommend Xtandi 160 mg daily. Will check coverage.  Discussed the rationale and side effects of Xtandi.   -check MMR  Parotid mass, this is stable since 2016. Orders Placed This Encounter  Procedures  . CBC with Differential/Platelet    Standing Status:   Standing    Number of Occurrences:   20    Standing Expiration Date:   12/07/2020  . Comprehensive metabolic panel    Standing Status:   Standing    Number of Occurrences:   20    Standing Expiration Date:   12/07/2020  . PSA    Standing Status:   Standing    Number of Occurrences:   20    Standing Expiration Date:   12/07/2020    All questions were answered. The patient knows to call the clinic with any problems  questions or concerns.  cc Azzie Glatter, FNP    Return of visit: To be determined Thank you for this kind referral and the opportunity to participate in the care of this patient. A copy of today's note is routed to referring provider    Earlie Server, MD, PhD Hematology Oncology [facility] at [facility] Pager- [phone] 12/08/2019

## 2019-12-12 ENCOUNTER — Other Ambulatory Visit: Payer: Self-pay | Admitting: Oncology

## 2019-12-13 ENCOUNTER — Other Ambulatory Visit: Payer: Self-pay | Admitting: Oncology

## 2019-12-13 DIAGNOSIS — C801 Malignant (primary) neoplasm, unspecified: Secondary | ICD-10-CM

## 2019-12-14 ENCOUNTER — Other Ambulatory Visit: Payer: Self-pay | Admitting: Oncology

## 2019-12-14 ENCOUNTER — Telehealth: Payer: Self-pay

## 2019-12-14 ENCOUNTER — Other Ambulatory Visit: Payer: Medicaid Other

## 2019-12-14 ENCOUNTER — Other Ambulatory Visit: Payer: Self-pay

## 2019-12-14 DIAGNOSIS — C801 Malignant (primary) neoplasm, unspecified: Secondary | ICD-10-CM

## 2019-12-14 DIAGNOSIS — C61 Malignant neoplasm of prostate: Secondary | ICD-10-CM

## 2019-12-14 DIAGNOSIS — N5082 Scrotal pain: Secondary | ICD-10-CM

## 2019-12-14 NOTE — Telephone Encounter (Signed)
-----   Message from Earlie Server, MD sent at 12/13/2019  3:17 PM EDT ----- I tried to call him a few time and not able to reach him.  Please let him know that the final pathology result is not consistent with prostate cancer but another different type of cancer, germ cell tumor. I recommend him to do additional labs, ordered, please arrange, and also scrotum US ASAP, ordered. Please arrange.  Please let him know that I may call him after I see these results. Thanks.

## 2019-12-14 NOTE — Telephone Encounter (Signed)
Patient notified of results and further work up is needed.  He voiced understanding and will be expecting a call from [place] for appt details.

## 2019-12-14 NOTE — Telephone Encounter (Signed)
Next available for [place] is 10/14.  Order changed to STAT.

## 2019-12-15 ENCOUNTER — Inpatient Hospital Stay: Payer: Medicaid Other | Attending: Oncology

## 2019-12-15 ENCOUNTER — Other Ambulatory Visit: Payer: Self-pay

## 2019-12-15 ENCOUNTER — Other Ambulatory Visit: Payer: Medicaid Other

## 2019-12-15 ENCOUNTER — Telehealth: Payer: Self-pay | Admitting: *Deleted

## 2019-12-15 ENCOUNTER — Ambulatory Visit
Admission: RE | Admit: 2019-12-15 | Discharge: 2019-12-15 | Disposition: A | Discharge status: Home / Self Care | Payer: Medicaid Other | Source: Ambulatory Visit | Attending: Oncology | Admitting: Oncology

## 2019-12-15 DIAGNOSIS — F17210 Nicotine dependence, cigarettes, uncomplicated: Secondary | ICD-10-CM | POA: Diagnosis not present

## 2019-12-15 DIAGNOSIS — I2510 Atherosclerotic heart disease of native coronary artery without angina pectoris: Secondary | ICD-10-CM | POA: Insufficient documentation

## 2019-12-15 DIAGNOSIS — I252 Old myocardial infarction: Secondary | ICD-10-CM | POA: Insufficient documentation

## 2019-12-15 DIAGNOSIS — Z803 Family history of malignant neoplasm of breast: Secondary | ICD-10-CM | POA: Insufficient documentation

## 2019-12-15 DIAGNOSIS — N400 Enlarged prostate without lower urinary tract symptoms: Secondary | ICD-10-CM | POA: Insufficient documentation

## 2019-12-15 DIAGNOSIS — C61 Malignant neoplasm of prostate: Secondary | ICD-10-CM | POA: Insufficient documentation

## 2019-12-15 DIAGNOSIS — Z8249 Family history of ischemic heart disease and other diseases of the circulatory system: Secondary | ICD-10-CM | POA: Diagnosis not present

## 2019-12-15 DIAGNOSIS — Z7901 Long term (current) use of anticoagulants: Secondary | ICD-10-CM | POA: Diagnosis not present

## 2019-12-15 DIAGNOSIS — I110 Hypertensive heart disease with heart failure: Secondary | ICD-10-CM | POA: Insufficient documentation

## 2019-12-15 DIAGNOSIS — Z7902 Long term (current) use of antithrombotics/antiplatelets: Secondary | ICD-10-CM | POA: Diagnosis not present

## 2019-12-15 DIAGNOSIS — C801 Malignant (primary) neoplasm, unspecified: Secondary | ICD-10-CM

## 2019-12-15 DIAGNOSIS — Z79899 Other long term (current) drug therapy: Secondary | ICD-10-CM | POA: Diagnosis not present

## 2019-12-15 DIAGNOSIS — C7951 Secondary malignant neoplasm of bone: Secondary | ICD-10-CM | POA: Insufficient documentation

## 2019-12-15 DIAGNOSIS — I509 Heart failure, unspecified: Secondary | ICD-10-CM | POA: Insufficient documentation

## 2019-12-15 DIAGNOSIS — K118 Other diseases of salivary glands: Secondary | ICD-10-CM

## 2019-12-15 DIAGNOSIS — N5082 Scrotal pain: Secondary | ICD-10-CM | POA: Diagnosis present

## 2019-12-15 DIAGNOSIS — Z833 Family history of diabetes mellitus: Secondary | ICD-10-CM | POA: Insufficient documentation

## 2019-12-15 DIAGNOSIS — Z7189 Other specified counseling: Secondary | ICD-10-CM

## 2019-12-15 LAB — CBC WITH DIFFERENTIAL/PLATELET
Abs Immature Granulocytes: 0.03 10*3/uL (ref 0.00–0.07)
Basophils Absolute: 0 10*3/uL (ref 0.0–0.1)
Basophils Relative: 1 %
Eosinophils Absolute: 0.1 10*3/uL (ref 0.0–0.5)
Eosinophils Relative: 1 %
HCT: 35.5 % — ABNORMAL LOW (ref 39.0–52.0)
Hemoglobin: 12.2 g/dL — ABNORMAL LOW (ref 13.0–17.0)
Immature Granulocytes: 1 %
Lymphocytes Relative: 34 %
Lymphs Abs: 2.1 10*3/uL (ref 0.7–4.0)
MCH: 27.3 pg (ref 26.0–34.0)
MCHC: 34.4 g/dL (ref 30.0–36.0)
MCV: 79.4 fL — ABNORMAL LOW (ref 80.0–100.0)
Monocytes Absolute: 0.6 10*3/uL (ref 0.1–1.0)
Monocytes Relative: 9 %
Neutro Abs: 3.3 10*3/uL (ref 1.7–7.7)
Neutrophils Relative %: 54 %
Platelets: 305 10*3/uL (ref 150–400)
RBC: 4.47 MIL/uL (ref 4.22–5.81)
RDW: 17.1 % — ABNORMAL HIGH (ref 11.5–15.5)
WBC: 6.2 10*3/uL (ref 4.0–10.5)
nRBC: 0 % (ref 0.0–0.2)

## 2019-12-15 LAB — COMPREHENSIVE METABOLIC PANEL
ALT: 54 U/L — ABNORMAL HIGH (ref 0–44)
AST: 67 U/L — ABNORMAL HIGH (ref 15–41)
Albumin: 3.7 g/dL (ref 3.5–5.0)
Alkaline Phosphatase: 90 U/L (ref 38–126)
Anion gap: 11 (ref 5–15)
BUN: 8 mg/dL (ref 6–20)
CO2: 27 mmol/L (ref 22–32)
Calcium: 9.5 mg/dL (ref 8.9–10.3)
Chloride: 100 mmol/L (ref 98–111)
Creatinine, Ser: 0.7 mg/dL (ref 0.61–1.24)
GFR calc non Af Amer: >60 mL/min (ref >60)
Glucose, Bld: 104 mg/dL — ABNORMAL HIGH (ref 70–99)
Potassium: 3.8 mmol/L (ref 3.5–5.1)
Sodium: 138 mmol/L (ref 135–145)
Total Bilirubin: 0.6 mg/dL (ref 0.3–1.2)
Total Protein: 7.6 g/dL (ref 6.5–8.1)

## 2019-12-15 LAB — LACTATE DEHYDROGENASE: LDH: 153 U/L (ref 98–192)

## 2019-12-15 LAB — SURGICAL PATHOLOGY

## 2019-12-15 LAB — PSA: Prostatic Specific Antigen: 3.19 ng/mL (ref 0.00–4.00)

## 2019-12-15 NOTE — Telephone Encounter (Signed)
IMPRESSION: 1. Small, heterogeneously hypodense mass of the central right testicle measuring approximately 1.6 cm, containing an internal calcification. 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 2. Normal size and appearance of the left testicle. 3. Arterial and venous Doppler flow is present bilaterally.  These results will be called to the ordering clinician or representative by the Radiologist Assistant, and communication documented in the PACS or [employer].   Electronically Signed   By: Eddie Candle M.D.   On: 12/15/2019 09:23

## 2019-12-15 NOTE — Progress Notes (Signed)
Tumor Board Documentation  [name] was presented by Dr Tasia Catchings at our Tumor Board on 12/15/2019, which included representatives from medical oncology, radiation oncology, surgical oncology, internal medicine, navigation, pathology, radiology, surgical, genetics, research, palliative care, pulmonology.  Daniel Weiss currently presents as a current patient, for [place], for new positive pathology with history of the following treatments: active survellience, surgical intervention(s) (Androgen deprivation therapy).  Additionally, we reviewed previous medical and familial history, history of present illness, and recent lab results along with all available histopathologic and imaging studies. The tumor board considered available treatment options and made the following recommendations:   Xtandi therapy  The following procedures/referrals were also placed: No orders of the defined types were placed in this encounter.   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  Tumor board is a meeting of clinicians from various specialty areas who evaluate and discuss patients for whom a multidisciplinary approach is being considered. Final determinations in the plan of care are those of the provider(s). The responsibility for follow up of recommendations given during tumor board is that of the provider.   Today's extended care, comprehensive team conference, Franky was not present for the discussion and was not examined.   Multidisciplinary Tumor Board is a multidisciplinary case peer review process.  Decisions discussed in the Multidisciplinary Tumor Board reflect the opinions of the specialists present at the conference without having examined the patient.  Ultimately, treatment and diagnostic decisions rest with the  primary provider(s) and the patient.

## 2019-12-16 LAB — BETA HCG QUANT (REF LAB): hCG Quant: <1 m[IU]/mL (ref 0–3)

## 2019-12-16 LAB — AFP TUMOR MARKER: AFP, Serum, Tumor Marker: 5.4 ng/mL (ref 0.0–8.3)

## 2019-12-19 ENCOUNTER — Telehealth: Payer: Self-pay

## 2019-12-19 NOTE — Telephone Encounter (Signed)
Patient notified and is expecting a call with appt detials.

## 2019-12-19 NOTE — Telephone Encounter (Signed)
Done.. Pt is scheduled to RTC on 10/14.Marland Kitchen Pt is aware. (MD Only)

## 2019-12-19 NOTE — Telephone Encounter (Signed)
-----   Message from Earlie Server, MD sent at 12/16/2019 11:48 PM EDT ----- Please schedule him to have another MD visit with me. Thanks.

## 2019-12-22 ENCOUNTER — Ambulatory Visit: Payer: Medicaid Other | Admitting: Cardiology

## 2019-12-22 ENCOUNTER — Other Ambulatory Visit: Payer: Self-pay

## 2019-12-22 ENCOUNTER — Encounter: Payer: Self-pay | Admitting: Oncology

## 2019-12-22 ENCOUNTER — Other Ambulatory Visit: Payer: Medicaid Other

## 2019-12-22 ENCOUNTER — Inpatient Hospital Stay (HOSPITAL_BASED_OUTPATIENT_CLINIC_OR_DEPARTMENT_OTHER): Payer: Medicaid Other | Admitting: Oncology

## 2019-12-22 VITALS — BP 138/83 | HR 88 | Temp 98.3°F | Resp 18 | Wt 149.1 lb

## 2019-12-22 DIAGNOSIS — N5089 Other specified disorders of the male genital organs: Secondary | ICD-10-CM | POA: Diagnosis not present

## 2019-12-22 DIAGNOSIS — K118 Other diseases of salivary glands: Secondary | ICD-10-CM | POA: Diagnosis not present

## 2019-12-22 DIAGNOSIS — C801 Malignant (primary) neoplasm, unspecified: Secondary | ICD-10-CM | POA: Insufficient documentation

## 2019-12-22 DIAGNOSIS — Z7189 Other specified counseling: Secondary | ICD-10-CM

## 2019-12-22 DIAGNOSIS — C61 Malignant neoplasm of prostate: Secondary | ICD-10-CM | POA: Diagnosis not present

## 2019-12-22 HISTORY — DX: Other specified counseling: Z71.89

## 2019-12-22 HISTORY — DX: Malignant (primary) neoplasm, unspecified: C80.1

## 2019-12-22 NOTE — Progress Notes (Signed)
Hematology/Oncology follow up note [facility] [phone] Fax:([phone]) [phone]   Patient Care Team: Daniel Glatter, FNP as PCP - General (Family Medicine) [name], MD as PCP - Cardiology (Cardiology) McKenzie, Candee Furbish, MD as Consulting Physician (Urology)  REFERRING PROVIDER: Azzie Glatter, FNP  CHIEF COMPLAINTS/REASON FOR VISIT:  Follow up for prostate cancer  HISTORY OF PRESENTING ILLNESS:   Daniel Weiss is a  59 y.o.  male with PMH listed below was seen in consultation at the request of  Daniel Glatter, FNP  for evaluation of presumed prostate cancer Patient recently switched his care to [facility] and was seen by Dr. Bernardo Weiss. He was previously followed up at [facility]rology I reviewed Daniel Weiss note. Was diagnosed with presumed prostate cancer on 10/2018 with a PSA of 509, incidental findings of multiple sclerotic lesions on CT-chest abdomen pelvis during work-up for a CVA.  11/05/2018 bone scan showed solitary punctate focus of activity in the left first sacral segment corresponding to a 9 mm mixed lytic and sclerotic metastatic's on recent CT.  The remaining numerous sclerotic osseous metastasis identified on CT do not demonstrate abnormal activity and are therefore healed.  No tissue diagnosis/biopsy was obtained. [facility] on 09/[facility]2019, PSA was 13.13, testosterone level less than 10. Casodex was discontinued in June 2021.    Patient has recent injury to left hip.   And repeat bone scan 10/06/2019 showed resolution of punctated activity over the left sacrum.  The punctate area of increased activity over the right lower lumbar spine again noted.  Patient has extensive cardiology issues.  He follows up with Daniel Weiss. Patient has history of STEMI-PCI to LAD with resultant ischemia cardiomyopathy, EF 35 to 40%, left bundle branch block with acute CVA-10/2018.  CT coronary showed  nonobstructing plaquing of coronary distributions, intimal irregularity with filling defect in the ascending aorta but no LV thrombus.  And the patient is on warfarin and Plavix.  Patient was seen by thoracic surgeon Dr. Roxy Weiss will recommend patient to be on Coumadin for minimum of 3 months.  With presentation with stroke, Daniel Weiss recommend at least 6 to 12 months of Coumadin  10/31/2019, CT angio chest aorta showed no evidence of aortic aneurysm or dissection.  Atherosclerotic calcification spleen seen in the upper abdominal aorta.  Chronic artery disease.  No acute cardiopulmonary disease. Patient has a chronic right neck mass.  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  He says that he cannot get operation on this mass due to the proximity to a vessel.  Patient lives with his sister.  He has 3 adult children.  His activity is quite limited due to shortness of breath with exertion due to cardiology problems.  INTERVAL HISTORY Daniel Weiss is a 59 y.o. 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 Tumor markers Showed normal LDH, beta hCG, AFP Scrotum ultrasound showed a small right testicular mass measuring 1.6 cm, contained an internal calcification.  Appearance was consistent with primary testicular neoplasm.  Normal size and appearance of the left testicle.  Chronic shortness of breath with exertion. accompanied by sister Daniel Weiss.   Review of Systems  Constitutional: Negative  for appetite change, chills, fatigue, fever and unexpected weight change.  HENT:   Negative for hearing loss and voice change.   Eyes: Negative for eye problems and icterus.  Respiratory: Positive for shortness of breath. Negative  for chest tightness and cough.   Cardiovascular: Negative for chest pain and leg swelling.  Gastrointestinal: Negative for abdominal distention and abdominal pain.  Endocrine: Negative for hot flashes.  Genitourinary: Negative for difficulty urinating, dysuria and frequency.   Musculoskeletal: Negative for arthralgias.       Left hip pain  Skin: Negative for itching and rash.  Neurological: Negative for light-headedness and numbness.  Hematological: Negative for adenopathy. Does not bruise/bleed easily.  Psychiatric/Behavioral: Negative for confusion.    MEDICAL HISTORY:  Past Medical History:  Diagnosis Date  . Abnormal chest CT 02/2018   a. Ectatic 3 cm Ao arch - rec f/u outpt imaging w/ CTA or MRA. Ectatic atheromatous abd Ao @ risk for aneurysm - rec f/u u/s in 5 yrs. Marked prostatic enlargement w/ scattered small sclerotic foci in the thoracic lower lumbar spine, sacrum, left 12th rib, pelvis, and right femur.  Recommend elective outpatient whole-body bone scintigraphy and PSA.  Daniel Weiss Kitchen CAD (coronary artery disease)    a. 02/2018 ACS/PCI: LM nl, LAD 85p (3.5x15 Sierra DES), D1 45ost, RI 55ost, RCA nl, EF 25-35%.  . Complete left bundle branch block (LBBB) 2016  . Current every day smoker   . HFrEF (heart failure with reduced ejection fraction) ([place])   . Hypertension   . Ischemic cardiomyopathy 02/2018   a. 02/2018 Echo: EF 35-40% (LV gram on cath was 25-30%), mod, diff HK. mid-apicalanteroseptal, ant, and apical sev HK. RVH.-->  No notable change on echo from June 2020 and August 2020.  Daniel Weiss Kitchen Myocardial infarction ([place])   . NSTEMI (non-ST elevated myocardial infarction) ([place]) 02/2018   85% pLAD - DES PCI   . Prostate cancer metastatic to multiple sites [facility]) 11/2018  . Stroke ([place])   . TIA (transient ischemic attack) 2016  . Warthin's tumor     SURGICAL HISTORY: Past Surgical History:  Procedure Laterality Date  . CORONARY STENT INTERVENTION N/A 03/06/2018   Procedure: CORONARY  STENT INTERVENTION;  Surgeon: [name], MD;  Location: [facility];  Service: Cardiovascular: Proximal LAD 85% stenosis (and D1): DES PCI with Xience Sierra DES 3.5 mm x 15 mm - 3.9 mm  . INTRAOPERATIVE TRANSTHORACIC ECHOCARDIOGRAM  03/06/2018   (Peri-MI) mild reduced EF 35-40%.  Diffuse hypokinesis (severe HK of the mid-apical anteroseptal, anterior and apical myocardium consistent with LAD infarct).  Unable to assess diastolic function.  Paradoxical septal motion likely related to his LBBB.  RV hypertrophy noted.  Trivial pericardial effusion noted.   Daniel Weiss Kitchen LEFT HEART CATH AND CORONARY ANGIOGRAPHY N/A 03/06/2018   Procedure: LEFT HEART CATH AND CORONARY ANGIOGRAPHY;  Surgeon: [name], MD;  Location: [facility];  Service: Cardiovascular: Proximal LAD 85% involving D1 45%.  Ostial RI 55%.  Severe LV dysfunction EF 25 to 30%.  1+ MR.  Only minimally elevated LVEDP.  Daniel Weiss Kitchen TRANSTHORACIC ECHOCARDIOGRAM  10/2018   (Most recent echo, to evaluate for TIA/CVA) : Moderate reduced EF 35 to 40%.  Moderate concentric LVH.  Mild dyskinesis of the apex and severe hypokinesis of mid apical anteroseptal and anterior wall.  Unable to assess RV pressures.  Aortic sclerosis with no stenosis.  No significant change seen    SOCIAL HISTORY: Social History   Socioeconomic History  . Marital  status: Legally Separated    Spouse name: Dorothenia  . Number of children: Not on file  . Years of education: Not on file  . Highest education level: Not on file  Occupational History  . Occupation: cook  Tobacco Use  . Smoking status: Current Some Day Smoker    Packs/day: 0.00    Years: 0.00    Pack years: 0.00    Types: Cigarettes  . Smokeless tobacco: Never Used  . Tobacco comment: quit three days ago  Vaping Use  . Vaping Use: Never used  Substance and Sexual Activity  . Alcohol use: Yes    Alcohol/week: 2.0 standard drinks    Types: 2 Cans of beer per week    Comment: 2 Beers daily.  .  Drug use: Yes    Frequency: 3.0 times per week    Types: Marijuana  . Sexual activity: Not Currently  Other Topics Concern  . Not on file  Social History Narrative  . Not on file   Social Determinants of Health   Financial Resource Strain:   . Difficulty of Paying Living Expenses: Not on file  Food Insecurity:   . Worried About Charity fundraiser in the Last Year: Not on file  . Ran Out of Food in the Last Year: Not on file  Transportation Needs:   . Lack of Transportation (Medical): Not on file  . Lack of Transportation (Non-Medical): Not on file  Physical Activity:   . Days of Exercise per Week: Not on file  . Minutes of Exercise per Session: Not on file  Stress:   . Feeling of Stress : Not on file  Social Connections:   . Frequency of Communication with Friends and Family: Not on file  . Frequency of Social Gatherings with Friends and Family: Not on file  . Attends Religious Services: Not on file  . Active Member of Clubs or Organizations: Not on file  . Attends Archivist Meetings: Not on file  . Marital Status: Not on file  Intimate Partner Violence:   . Fear of Current or Ex-Partner: Not on file  . Emotionally Abused: Not on file  . Physically Abused: Not on file  . Sexually Abused: Not on file    FAMILY HISTORY: Family History  Problem Relation Age of Onset  . Stroke Mother   . Hypertension Mother   . Diabetes type II Mother   . Leukemia Other   . Breast cancer Other   . Stroke Brother   . CAD Neg Hx     ALLERGIES:  has No Known Allergies.  MEDICATIONS:  Current Outpatient Medications  Medication Sig Dispense Refill  . albuterol (VENTOLIN HFA) 108 (90 Base) MCG/ACT inhaler Inhale 2 puffs into the lungs every 6 (six) hours as needed for wheezing or shortness of breath. 8 g 11  . bicalutamide (CASODEX) 50 MG tablet Take 1 tablet by mouth twice daily 60 tablet 0  . clopidogrel (PLAVIX) 75 MG tablet Take 1 tablet (75 mg total) by mouth daily. 90  tablet 3  . furosemide (LASIX) 20 MG tablet May take 20 mg tablet as needed for increase shortness of breath or swelling (Patient taking differently: May take 20 mg tablet as needed for increase shortness of breath or swelling  1 QAM and 1/2 QPM) 20 tablet 6  . metoprolol succinate (TOPROL-XL) 25 MG 24 hr tablet Take 1 tablet (25 mg total) by mouth daily with supper. 45 tablet 1  . nitroGLYCERIN (NITROSTAT)  0.4 MG SL tablet Place 1 tablet (0.4 mg total) under the tongue every 5 (five) minutes as needed for chest pain. (Patient not taking: Reported on 12/08/2019) 25 tablet 3  . ondansetron (ZOFRAN) 4 MG tablet Take 4 mg by mouth every 4 (four) hours as needed.    . rosuvastatin (CRESTOR) 40 MG tablet Take 1 tablet by mouth once daily 30 tablet 1  . sacubitril-valsartan (ENTRESTO) 24-26 MG Take 1/2 tablet in the morning and 1 tablet in the evening 60 tablet 8  . tamsulosin (FLOMAX) 0.4 MG CAPS capsule Take 1 capsule (0.4 mg total) by mouth daily after supper. 90 capsule 3  . venlafaxine XR (EFFEXOR XR) 37.5 MG 24 hr capsule Take 1 capsule (37.5 mg total) by mouth daily with breakfast. 30 capsule 1   No current facility-administered medications for this visit.     PHYSICAL EXAMINATION: ECOG PERFORMANCE STATUS: 1 - Symptomatic but completely ambulatory Vitals:   12/22/19 0830  BP: 138/83  Pulse: 88  Resp: 18  Temp: 98.3 F (36.8 C)   Filed Weights   12/22/19 0830  Weight: 149 lb 1.6 oz (67.6 kg)    Physical Exam Constitutional:      General: He is not in acute distress.    Comments: Thin built.   HENT:     Head: Normocephalic and atraumatic.  Eyes:     General: No scleral icterus. Neck:     Comments: Palpable right cervical mass Cardiovascular:     Rate and Rhythm: Normal rate and regular rhythm.     Heart sounds: Murmur heard.   Pulmonary:     Effort: Pulmonary effort is normal. No respiratory distress.     Breath sounds: No wheezing.  Abdominal:     General: Bowel sounds  are normal. There is no distension.     Palpations: Abdomen is soft.  Musculoskeletal:        General: No deformity. Normal range of motion.     Cervical back: Normal range of motion and neck supple.  Skin:    General: Skin is warm and dry.     Findings: No erythema or rash.  Neurological:     Mental Status: He is alert and oriented to person, place, and time. Mental status is at baseline.     Cranial Nerves: No cranial nerve deficit.     Coordination: Coordination normal.  Psychiatric:        Mood and Affect: Mood normal.     LABORATORY DATA:  I have reviewed the data as listed Lab Results  Component Value Date   WBC 6.2 12/15/2019   HGB 12.2 (L) 12/15/2019   HCT 35.5 (L) 12/15/2019   MCV 79.4 (L) 12/15/2019   PLT 305 12/15/2019   Recent Labs    02/02/19 0906 02/02/19 0906 06/07/19 1253 07/26/19 1450 11/01/19 1021 12/15/19 0920  NA 139  --   --  134* 138 138  K 4.9   < >  --  4.5 4.3 3.8  CL 103   < >  --  99 102 100  CO2 23   < >  --  23 25 27   GLUCOSE 70  --   --  111* 107* 104*  BUN 10  --   --  14 11 8   CREATININE 0.74*   < >  --  0.74 0.78 0.70  CALCIUM 9.4   < >  --  9.5 9.2 9.5  GFRNONAA 101   < >  --  >  60 >60 >60  GFRAA 117  --   --  >60 >60  --   PROT 7.0   < > 7.0  --  7.6 7.6  ALBUMIN 4.2   < > 4.2  --  3.8 3.7  AST 17   < > 91*  --  44* 67*  ALT 8   < > 69*  --  31 54*  ALKPHOS 65   < > 103  --  78 90  BILITOT 0.3   < > 0.2  --  0.5 0.6  BILIDIR  --   --  0.10  --   --   --    < > = values in this interval not displayed.   Iron/TIBC/Ferritin/ %Sat No results found for: IRON, TIBC, FERRITIN, IRONPCTSAT    RADIOGRAPHIC STUDIES: I have personally reviewed the radiological images as listed and agreed with the findings in the report. CT Biopsy  Result Date: 12/01/2019 INDICATION: 59 year old male with history of prostate cancer and right inguinal and retroperitoneal lymphadenopathy. EXAM: CT BIOPSY COMPARISON:  CT abdomen pelvis from 11/18/2019  MEDICATIONS: None. ANESTHESIA/SEDATION: Fentanyl 100 mcg IV; Versed 2 mg IV Sedation time: 17 minutes; The patient was continuously monitored during the procedure by the interventional radiology nurse under my direct supervision. CONTRAST:  None. COMPLICATIONS: None immediate. PROCEDURE: Informed consent was obtained from the patient following an explanation of the procedure, risks, benefits and alternatives. A time out was performed prior to the initiation of the procedure. The patient was positioned prone on the CT table and a limited CT was performed for procedural planning demonstrating similar appearance of an enlarged pericaval retroperitoneal lymph node measuring up to 14 mm in short axis on axial plane. The procedure was planned. The operative site was prepped and draped in the usual sterile fashion. Appropriate trajectory was confirmed with a 22 gauge spinal needle after the adjacent tissues were anesthetized with 1% Lidocaine with epinephrine. Under intermittent CT guidance, a 17 gauge coaxial needle was advanced into the peripheral aspect of the mass. Appropriate positioning was confirmed and a total of 5 samples were obtained with an 18 gauge core needle biopsy device. The co-axial needle was removed and hemostasis was achieved with manual compression. A limited postprocedural CT was negative for hemorrhage or additional complication. A dressing was placed. The patient tolerated the procedure well without immediate postprocedural complication. IMPRESSION: Technically successful CT guided core needle biopsy of right pericaval retroperitoneal enlarged lymph node. Electronically Signed   By: Ruthann Cancer MD   On: 12/01/2019 07:57   MYOCARDIAL PERFUSION IMAGING  Result Date: 12/06/2019  The left ventricular ejection fraction is moderately decreased (30-44%).  Nuclear stress EF: 35%.  Defect 1: There is a large defect of moderate severity present in the mid anterior, mid anteroseptal, apical anterior,  apical septal, apical inferior and apex location.  Findings consistent with prior myocardial infarction. No ischemia.  This is an intermediate risk study due to reduced systolic function.    US SCROTUM W/DOPPLER  Result Date: 12/15/2019 CLINICAL DATA:  Germ-cell tumor, prostate cancer, lymphadenopathy, scrotal pain EXAM: SCROTAL ULTRASOUND DOPPLER ULTRASOUND OF THE TESTICLES TECHNIQUE: Complete ultrasound examination of the testicles, epididymis, and other scrotal structures was performed. Color and spectral Doppler ultrasound were also utilized to evaluate blood flow to the testicles. COMPARISON:  None. FINDINGS: Right testicle Measurements: 3.7 x 1.3 x 1.9 cm. There is a small, heterogeneously hypodense mass of the central right testicle measuring approximately 1.6 x 1.1 x 1.5 cm,  containing an internal calcification. Left testicle Measurements: 3.9 x 1.4 x 2.1 cm. No mass or microlithiasis visualized. Right epididymis:  Normal in size and appearance. Left epididymis: Normal in size and appearance. Incidental subcentimeter cyst or spermatocele. Hydrocele:  None visualized. Varicocele:  None visualized. Pulsed Doppler interrogation of both testes demonstrates normal low resistance arterial and venous waveforms bilaterally. IMPRESSION: 1. Small, heterogeneously hypodense mass of the central right testicle measuring approximately 1.6 cm, containing an internal calcification. 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 2. Normal size and appearance of the left testicle. 3. Arterial and venous Doppler flow is present bilaterally. These results will be called to the ordering clinician or representative by the Radiologist Assistant, and communication documented in the PACS or [employer]. Electronically Signed   By: Eddie Candle M.D.   On: 12/15/2019 09:23      ASSESSMENT & PLAN:  1. Germ cell  tumor ([place])   2. Goals of care, counseling/discussion   3. Testicular mass   4. Parotid mass    #Paracaval lymph node biopsy showed metastatic neoplasm,consistent with metastatic germ cell tumor, morphologically compatible with seminoma, and his case was reviewed on tumor board.   Tumor markers Showed normal LDH, beta hCG, AFP US scrotum showed right testicular mass.  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  I recommend patient to see DanielStoioff, for discussion of Orchiectomy. I discussed with DanielStoioff about the case vis secure chat.   #Sclerotic bone metastasis, elevation of PSA, presumed metastatic prostate cancer with bone metastasis,  currently on Eligard, managed by Daniel Weiss office.  Last Eligard was given in September 2021.  Urology for discussion of prostate biopsy. Hold off additional therapy until prostate cancer is pathologically confirmed.   He has multiple medical problems, TIA, Stroke, NSTEMI, HTN, CHF, LBBB,  tobacco abuse, PS 1-2,   Parotid mass, per pt this is stable since 2016.   All questions were answered. The patient knows to call the clinic with any problems questions or concerns.  cc Daniel Glatter, FNP    Return of visit: TBD.   Earlie Server, MD, PhD Hematology Oncology [facility] Pager- [phone] 12/22/2019

## 2019-12-22 NOTE — Progress Notes (Signed)
Patient denies new problems/concerns today.   °

## 2020-01-06 ENCOUNTER — Telehealth: Payer: Self-pay

## 2020-01-06 ENCOUNTER — Encounter: Payer: Self-pay | Admitting: Family Medicine

## 2020-01-06 NOTE — H&P (View-Only) (Signed)
-  01/09/2020 10:23 AM   Daniel Weiss [birth year] [id number]  Referring provider: Azzie Glatter, [place],  [place] [address] Chief Complaint  Patient presents with   Prostate Cancer    HPI: Daniel Weiss is a 59 y.o. male with a history of prostate cancer who returns today to at the request of Dr. Tasia Catchings to discuss surgical intervention.   -Patient was diagnosed with presumed prostate cancer on 10/2018; PSA 509; treated with ADT, no biopsy.  This was an incidental finding during work-up for CVA when multiple sclerotic lesions CT chest/abdomen/pelvis were identified which led to his PSA being checked -Bone scan identified a single area of abnormal uptake left sacrum -Initially treated [facility] and PSA 09/04/2020 was 13.3 with testosterone <10 ng/dL -Repeat bone scan 10/06/2019 with resolution of activity over the left sacrum -CT A/P on 11/18/2019 showed lymphadenopathy in the retrocaval space and right common and external iliac chains.  -Numerous sclerotic bone lesions, similar to prior study.  -Patient underwent a CT guided biopsy of a right pericaval lymph node on 11/30/2019 with pathology returning metastatic germ cell tumor, morphologically consistent with seminoma  -Beta-hCG, alpha-fetoprotein and LDH were all normal -Scrotal ultrasound on 12/15/2019 noted small, heterogeneously hypodense mass of the central right testicle measuring approximately 1.6 cm, containing an internal calcification. Normal size and appearance of the left testicle. Arterial and venous Doppler flow is present bilaterally. -Patient remains on tamsulosin.  -Patient states he is currently off blood thinners. -Multiple medical comorbidities including history of CVA, TIA, NSTEMI, CHF  PSA trend: 11/03/2018: 502 11/22/2018: 377 02/21/2019: 387 04/25/2019: 123 09/05/2019: 13.3 11/01/2019: 5.45 12/15/2019: 3.19   PMH: Past Medical History:  Diagnosis Date   Abnormal chest CT  02/2018   a. Ectatic 3 cm Ao arch - rec f/u outpt imaging w/ CTA or MRA. Ectatic atheromatous abd Ao @ risk for aneurysm - rec f/u u/s in 5 yrs. Marked prostatic enlargement w/ scattered small sclerotic foci in the thoracic lower lumbar spine, sacrum, left 12th rib, pelvis, and right femur.  Recommend elective outpatient whole-body bone scintigraphy and PSA.   CAD (coronary artery disease)    a. 02/2018 ACS/PCI: LM nl, LAD 85p (3.5x15 Sierra DES), D1 45ost, RI 55ost, RCA nl, EF 25-35%.   Complete left bundle branch block (LBBB) 2016   Current every day smoker    HFrEF (heart failure with reduced ejection fraction) ([place])    Hypertension    Ischemic cardiomyopathy 02/2018   a. 02/2018 Echo: EF 35-40% (LV gram on cath was 25-30%), mod, diff HK. mid-apicalanteroseptal, ant, and apical sev HK. RVH.-->  No notable change on echo from June 2020 and August 2020.   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  Diffuse hypokinesis (severe HK of the mid-apical anteroseptal, anterior and apical myocardium consistent with  LAD infarct).  Unable to assess diastolic function.  Paradoxical septal motion likely related to his LBBB.  RV hypertrophy noted.  Trivial pericardial effusion noted.    LEFT HEART CATH AND CORONARY ANGIOGRAPHY N/A 03/06/2018   Procedure: LEFT  HEART CATH AND CORONARY ANGIOGRAPHY;  Surgeon: [name], MD;  Location: [place] [facility];  Service: Cardiovascular: Proximal LAD 85% involving D1 45%.  Ostial RI 55%.  Severe LV dysfunction EF 25 to 30%.  1+ MR.  Only minimally elevated LVEDP.   TRANSTHORACIC ECHOCARDIOGRAM  10/2018   (Most recent echo, to evaluate for TIA/CVA) : Moderate reduced EF 35 to 40%.  Moderate concentric LVH.  Mild dyskinesis of the apex and severe hypokinesis of mid apical anteroseptal and anterior wall.  Unable to assess RV pressures.  Aortic sclerosis with no stenosis.  No significant change seen    Home Medications:  Allergies as of 01/09/2020   No Known Allergies     Medication List      - Accurate as of January 09, 2020 10:23 AM. If you have any questions, ask your nurse or doctor.        albuterol 108 (90 Base) MCG/ACT inhaler Commonly known as: VENTOLIN HFA Inhale 2 puffs into the lungs every 6 (six) hours as needed for wheezing or shortness of breath.   bicalutamide 50 MG tablet Commonly known as: CASODEX Take 1 tablet by mouth twice daily   clopidogrel 75 MG tablet Commonly known as: PLAVIX Take 1 tablet (75 mg total) by mouth daily.   Entresto 24-26 MG Generic drug: sacubitril-valsartan Take 1/2 tablet in the morning and 1 tablet in the evening   furosemide 20 MG tablet Commonly known as: Lasix May take 20 mg tablet as needed for increase shortness of breath or swelling What changed: additional instructions   metoprolol succinate 25 MG 24 hr tablet Commonly known as: TOPROL-XL Take 1 tablet (25 mg total) by mouth daily with supper.   nitroGLYCERIN 0.4 MG SL tablet Commonly known as: Nitrostat Place 1 tablet (0.4 mg total) under the tongue every 5 (five) minutes as needed for chest pain.   ondansetron 4 MG tablet Commonly known as: ZOFRAN Take 4 mg by mouth every 4 (four) hours as needed.   rosuvastatin 40 MG tablet Commonly known as: CRESTOR Take 1 tablet by mouth  once daily   tamsulosin 0.4 MG Caps capsule Commonly known as: FLOMAX Take 1 capsule (0.4 mg total) by mouth daily after supper.   venlafaxine XR 37.5 MG 24 hr capsule Commonly known as: Effexor XR Take 1 capsule (37.5 mg total) by mouth daily with breakfast.       Allergies: No Known Allergies  Family History: Family History  Problem Relation Age of Onset   Stroke Mother    Hypertension Mother    Diabetes type II Mother    Leukemia Other    Breast cancer Other    Stroke Brother    CAD Neg Hx     Social History:  reports that he has been smoking cigarettes. He has been smoking about 0.00 packs per day for the past 0.00 years. He has never used smokeless tobacco. He reports current alcohol use of about 2.0 standard drinks of alcohol per week. He reports current drug use. Frequency: 3.00 times per week. Drug: Marijuana.   Physical Exam: BP (!) 148/76    Pulse 70    Ht 6' (1.829 m)    Wt 149 lb (67.6 kg)  BMI 20.21 kg/m   Constitutional:  Alert and oriented, No acute distress. HEENT: [place] AT, moist mucus membranes.  Trachea midline, no masses. Cardiovascular: No clubbing, cyanosis, or edema. Respiratory: Normal respiratory effort, no increased work of breathing. GI: Abdomen is soft, nontender, nondistended, no abdominal masses GU: No CVA tenderness.  Phallus without lesions, testes descended bilaterally without masses or tenderness Lymph: No cervical or inguinal lymphadenopathy. Skin: No rashes, bruises or suspicious lesions. Neurologic: Grossly intact, no focal deficits, moving all 4 extremities. Psychiatric: Normal mood and affect.  Laboratory Data:  Lab Results  Component Value Date   CREATININE 0.70 12/15/2019     Assessment & Plan:    1.  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 Ellwood  and we discussed right radical orchiectomy  He was seen preoperatively by cardiology on 11/28/2019  The procedure was discussed in detail including potential risks of bleeding, infection, nerve injury and the likelihood of additional treatment  2. Prostate cancer (presumed) Prostate biopsy was never performed.  Will plan on TRUS/prostate biopsy at the time of orchiectomy.  We discussed prostate biopsy in detail including the procedure itself, the risks of blood in the urine, stool, and ejaculate, serious infection, and discomfort. He is willing to proceed with this as discussed.  [place] [address] [place], [place] [address] [phone]  I, Selena Batten, am acting as a scribe for Dr. Nicki Reaper C. Dejanay Wamboldt,  I have reviewed the above documentation for accuracy and completeness, and I agree with the above.    Abbie Sons, MD

## 2020-01-06 NOTE — Telephone Encounter (Signed)
Pt is established with Dr. Elenor Quinones and is scheduled to see him in December. Pt will need to be seen sooner for discussion of Orchiectomy, per Dr. Tasia Catchings. She discussed this case with Dr. Elenor Quinones already. Contacted [facility] and got pt an appt for Monday 11/1 @2 :15p. Contacted pt to let him know and he would like an earlier time appt. Provided pt with [facility] office number for him to call and see what time availabilities they have.

## 2020-01-06 NOTE — Progress Notes (Signed)
-  01/09/2020 10:23 AM   [name] September 27, 1960 [id number]  Referring provider: Azzie Glatter, [place],  [place] [address] Chief Complaint  Patient presents with   Prostate Cancer    HPI: Daniel Weiss is a 59 y.o. male with a history of prostate cancer who returns today to at the request of Dr. Tasia Catchings to discuss surgical intervention.   -Patient was diagnosed with presumed prostate cancer on 10/2018; PSA 509; treated with ADT, no biopsy.  This was an incidental finding during work-up for CVA when multiple sclerotic lesions CT chest/abdomen/pelvis were identified which led to his PSA being checked -Bone scan identified a single area of abnormal uptake left sacrum -Initially treated [facility] and PSA 09/04/2020 was 13.3 with testosterone <10 ng/dL -Repeat bone scan 10/06/2019 with resolution of activity over the left sacrum -CT A/P on 11/18/2019 showed lymphadenopathy in the retrocaval space and right common and external iliac chains.  -Numerous sclerotic bone lesions, similar to prior study.  -Patient underwent a CT guided biopsy of a right pericaval lymph node on 11/30/2019 with pathology returning metastatic germ cell tumor, morphologically consistent with seminoma  -Beta-hCG, alpha-fetoprotein and LDH were all normal -Scrotal ultrasound on 12/15/2019 noted small, heterogeneously hypodense mass of the central right testicle measuring approximately 1.6 cm, containing an internal calcification. Normal size and appearance of the left testicle. Arterial and venous Doppler flow is present bilaterally. -Patient remains on tamsulosin.  -Patient states he is currently off blood thinners. -Multiple medical comorbidities including history of CVA, TIA, NSTEMI, CHF  PSA trend: 11/03/2018: 502 11/22/2018: 377 02/21/2019: 387 04/25/2019: 123 09/05/2019: 13.3 11/01/2019: 5.45 12/15/2019: 3.19   PMH: Past Medical History:  Diagnosis Date   Abnormal chest CT  02/2018   a. Ectatic 3 cm Ao arch - rec f/u outpt imaging w/ CTA or MRA. Ectatic atheromatous abd Ao @ risk for aneurysm - rec f/u u/s in 5 yrs. Marked prostatic enlargement w/ scattered small sclerotic foci in the thoracic lower lumbar spine, sacrum, left 12th rib, pelvis, and right femur.  Recommend elective outpatient whole-body bone scintigraphy and PSA.   CAD (coronary artery disease)    a. 02/2018 ACS/PCI: LM nl, LAD 85p (3.5x15 Sierra DES), D1 45ost, RI 55ost, RCA nl, EF 25-35%.   Complete left bundle branch block (LBBB) 2016   Current every day smoker    HFrEF (heart failure with reduced ejection fraction) ([place])    Hypertension    Ischemic cardiomyopathy 02/2018   a. 02/2018 Echo: EF 35-40% (LV gram on cath was 25-30%), mod, diff HK. mid-apicalanteroseptal, ant, and apical sev HK. RVH.-->  No notable change on echo from June 2020 and August 2020.   Myocardial infarction [facility])    NSTEMI (non-ST elevated myocardial infarction) ([place]) 02/2018   85% pLAD - DES PCI    Prostate cancer metastatic to multiple sites [facility]) 11/2018   Stroke ([place])    TIA (transient ischemic attack) 2016   Warthin's tumor     Surgical History: Past Surgical History:  Procedure Laterality Date   CORONARY STENT INTERVENTION N/A 03/06/2018   Procedure: CORONARY STENT INTERVENTION;  Surgeon: [name], MD;  Location: [place] [facility];  Service: Cardiovascular: Proximal LAD 85% stenosis (and D1): DES PCI with Xience Sierra DES 3.5 mm x 15 mm - 3.9 mm   INTRAOPERATIVE TRANSTHORACIC ECHOCARDIOGRAM  03/06/2018   (Peri-MI) mild reduced EF 35-40%.  Diffuse hypokinesis (severe HK of the mid-apical anteroseptal, anterior and apical myocardium consistent with  LAD infarct).  Unable to assess diastolic function.  Paradoxical septal motion likely related to his LBBB.  RV hypertrophy noted.  Trivial pericardial effusion noted.    LEFT HEART CATH AND CORONARY ANGIOGRAPHY N/A 03/06/2018   Procedure: LEFT  HEART CATH AND CORONARY ANGIOGRAPHY;  Surgeon: [name], MD;  Location: [place] [facility];  Service: Cardiovascular: Proximal LAD 85% involving D1 45%.  Ostial RI 55%.  Severe LV dysfunction EF 25 to 30%.  1+ MR.  Only minimally elevated LVEDP.   TRANSTHORACIC ECHOCARDIOGRAM  10/2018   (Most recent echo, to evaluate for TIA/CVA) : Moderate reduced EF 35 to 40%.  Moderate concentric LVH.  Mild dyskinesis of the apex and severe hypokinesis of mid apical anteroseptal and anterior wall.  Unable to assess RV pressures.  Aortic sclerosis with no stenosis.  No significant change seen    Home Medications:  Allergies as of 01/09/2020   No Known Allergies     Medication List      - Accurate as of January 09, 2020 10:23 AM. If you have any questions, ask your nurse or doctor.        albuterol 108 (90 Base) MCG/ACT inhaler Commonly known as: VENTOLIN HFA Inhale 2 puffs into the lungs every 6 (six) hours as needed for wheezing or shortness of breath.   bicalutamide 50 MG tablet Commonly known as: CASODEX Take 1 tablet by mouth twice daily   clopidogrel 75 MG tablet Commonly known as: PLAVIX Take 1 tablet (75 mg total) by mouth daily.   Entresto 24-26 MG Generic drug: sacubitril-valsartan Take 1/2 tablet in the morning and 1 tablet in the evening   furosemide 20 MG tablet Commonly known as: Lasix May take 20 mg tablet as needed for increase shortness of breath or swelling What changed: additional instructions   metoprolol succinate 25 MG 24 hr tablet Commonly known as: TOPROL-XL Take 1 tablet (25 mg total) by mouth daily with supper.   nitroGLYCERIN 0.4 MG SL tablet Commonly known as: Nitrostat Place 1 tablet (0.4 mg total) under the tongue every 5 (five) minutes as needed for chest pain.   ondansetron 4 MG tablet Commonly known as: ZOFRAN Take 4 mg by mouth every 4 (four) hours as needed.   rosuvastatin 40 MG tablet Commonly known as: CRESTOR Take 1 tablet by mouth  once daily   tamsulosin 0.4 MG Caps capsule Commonly known as: FLOMAX Take 1 capsule (0.4 mg total) by mouth daily after supper.   venlafaxine XR 37.5 MG 24 hr capsule Commonly known as: Effexor XR Take 1 capsule (37.5 mg total) by mouth daily with breakfast.       Allergies: No Known Allergies  Family History: Family History  Problem Relation Age of Onset   Stroke Mother    Hypertension Mother    Diabetes type II Mother    Leukemia Other    Breast cancer Other    Stroke Brother    CAD Neg Hx     Social History:  reports that he has been smoking cigarettes. He has been smoking about 0.00 packs per day for the past 0.00 years. He has never used smokeless tobacco. He reports current alcohol use of about 2.0 standard drinks of alcohol per week. He reports current drug use. Frequency: 3.00 times per week. Drug: Marijuana.   Physical Exam: BP (!) 148/76    Pulse 70    Ht 6' (1.829 m)    Wt 149 lb (67.6 kg)  BMI 20.21 kg/m   Constitutional:  Alert and oriented, No acute distress. HEENT: [place] AT, moist mucus membranes.  Trachea midline, no masses. Cardiovascular: No clubbing, cyanosis, or edema. Respiratory: Normal respiratory effort, no increased work of breathing. GI: Abdomen is soft, nontender, nondistended, no abdominal masses GU: No CVA tenderness.  Phallus without lesions, testes descended bilaterally without masses or tenderness Lymph: No cervical or inguinal lymphadenopathy. Skin: No rashes, bruises or suspicious lesions. Neurologic: Grossly intact, no focal deficits, moving all 4 extremities. Psychiatric: Normal mood and affect.  Laboratory Data:  Lab Results  Component Value Date   CREATININE 0.70 12/15/2019     Assessment & Plan:    1.  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 Levario  and we discussed right radical orchiectomy  He was seen preoperatively by cardiology on 11/28/2019  The procedure was discussed in detail including potential risks of bleeding, infection, nerve injury and the likelihood of additional treatment  2. Prostate cancer (presumed) Prostate biopsy was never performed.  Will plan on TRUS/prostate biopsy at the time of orchiectomy.  We discussed prostate biopsy in detail including the procedure itself, the risks of blood in the urine, stool, and ejaculate, serious infection, and discomfort. He is willing to proceed with this as discussed.  [place] [address], [place], [place] [address] [phone]  I, Selena Batten, am acting as a scribe for Dr. Nicki Reaper C. Billy Turvey,  I have reviewed the above documentation for accuracy and completeness, and I agree with the above.    Abbie Sons, MD

## 2020-01-06 NOTE — Telephone Encounter (Signed)
Per Hyrum from [place]: Daniel Weiss [phone] daughter wanted to discuss the plan with you she is on his chart to talk to just got concerns and pt is having new symptoms wants to know outcome of turmor board discussion  Called number provided but no one named Daniel Weiss was available at that number.

## 2020-01-09 ENCOUNTER — Ambulatory Visit: Payer: Medicaid Other | Admitting: Urology

## 2020-01-09 ENCOUNTER — Other Ambulatory Visit: Payer: Self-pay

## 2020-01-09 ENCOUNTER — Encounter: Payer: Self-pay | Admitting: Urology

## 2020-01-09 ENCOUNTER — Ambulatory Visit (INDEPENDENT_AMBULATORY_CARE_PROVIDER_SITE_OTHER): Payer: Medicaid Other | Admitting: Urology

## 2020-01-09 VITALS — BP 148/76 | HR 70 | Ht 72.0 in | Wt 149.0 lb

## 2020-01-09 DIAGNOSIS — C801 Malignant (primary) neoplasm, unspecified: Secondary | ICD-10-CM

## 2020-01-09 DIAGNOSIS — C61 Malignant neoplasm of prostate: Secondary | ICD-10-CM

## 2020-01-10 ENCOUNTER — Other Ambulatory Visit: Payer: Self-pay | Admitting: Urology

## 2020-01-11 ENCOUNTER — Other Ambulatory Visit: Payer: Self-pay | Admitting: Radiology

## 2020-01-11 ENCOUNTER — Other Ambulatory Visit: Payer: Self-pay | Admitting: Urology

## 2020-01-11 ENCOUNTER — Encounter: Payer: Self-pay | Admitting: Urology

## 2020-01-11 ENCOUNTER — Other Ambulatory Visit: Payer: Self-pay | Admitting: Family Medicine

## 2020-01-11 ENCOUNTER — Telehealth: Payer: Self-pay

## 2020-01-11 DIAGNOSIS — N5089 Other specified disorders of the male genital organs: Secondary | ICD-10-CM

## 2020-01-11 DIAGNOSIS — C61 Malignant neoplasm of prostate: Secondary | ICD-10-CM

## 2020-01-11 DIAGNOSIS — R972 Elevated prostate specific antigen [PSA]: Secondary | ICD-10-CM

## 2020-01-11 NOTE — Telephone Encounter (Signed)
   [facility] Pre-operative Risk Assessment      Request for surgical clearance:  1. What type of surgery is being performed? ORCHIECTOMY/RADICAL BIOPSY TRANSRECTAL ULTRASONIC PROSTATE (TUBP)   2. When is this surgery scheduled? 01-17-2020 VS 01-24-2020   3. What type of clearance is required (medical clearance vs. Pharmacy clearance to hold med vs. Both)? BOTH  4. Are there any medications that need to be held prior to surgery and how long? HOLD ALL ANTICOAGULANTS PRIOR TO SURGERY   5. Practice name and name of physician performing surgery? [facility]   6. What is the office phone number? [phone]   7.   What is the office fax number? [phone]  8.   Anesthesia type (None, local, MAC, general) ? GENERAL

## 2020-01-12 ENCOUNTER — Other Ambulatory Visit: Payer: Medicaid Other

## 2020-01-12 ENCOUNTER — Other Ambulatory Visit: Payer: Self-pay

## 2020-01-12 DIAGNOSIS — C61 Malignant neoplasm of prostate: Secondary | ICD-10-CM

## 2020-01-12 NOTE — Telephone Encounter (Signed)
   Primary Cardiologist: Glenetta Hew, MD  Chart reviewed as part of pre-operative protocol coverage. Patient was contacted 01/12/2020 in reference to pre-operative risk assessment for pending surgery as outlined below.  Denym Rahimi was last seen on 11/28/2019 by Dr. Ellyn Hack.  Since that day, Luchiano Viscomi has done well without chest pain. Recent nuclear stress test in Sept 2021 showed EF 35%, previous infarct, but no reversible ischemia.   Therefore, based on ACC/AHA guidelines, the patient would be at acceptable risk for the planned procedure without further cardiovascular testing.   The patient was advised that if he develops new symptoms prior to surgery to contact our office to arrange for a follow-up visit, and he verbalized understanding.  I will route this recommendation to the requesting party via Epic fax function and remove from pre-op pool. Please call with questions.  He has been instructed to start holding Plavix today and restart as soon as possible after the procedure at the surgeon's discretion.   Almyra Deforest, [place] 01/12/2020, 11:33 AM

## 2020-01-12 NOTE — Telephone Encounter (Signed)
Left voicemail for the patient to call back and speak to the on call preop APP. Also instructed the patient in the voicemail to start holding plavix. Patient had a h/o ICM, chronic systolic CHF, CAD and PE. Finished a course of coumadin and restarted on plavix recently. Also had myoview on 12/06/2019 which showed EF 35%, large area of infarct, but no ischemia. Unless he has any new issues, he will be cleared for upcoming procedure on 11/9.   Will notify Dr. Ellyn Hack regarding the need to hold plavix. Again, I have left instruction on his voicemail to start holding plavix given the urgent nature of his surgery.

## 2020-01-13 ENCOUNTER — Other Ambulatory Visit: Payer: Self-pay

## 2020-01-13 ENCOUNTER — Encounter
Admission: RE | Admit: 2020-01-13 | Discharge: 2020-01-13 | Disposition: A | Discharge status: Home / Self Care | Payer: Medicaid Other | Source: Ambulatory Visit | Attending: Urology | Admitting: Urology

## 2020-01-13 HISTORY — DX: Dyspnea, unspecified: R06.00

## 2020-01-13 LAB — URINALYSIS, COMPLETE
Bilirubin, UA: NEGATIVE
Glucose, UA: NEGATIVE
Ketones, UA: NEGATIVE
Leukocytes,UA: NEGATIVE
Nitrite, UA: NEGATIVE
Protein,UA: NEGATIVE
Specific Gravity, UA: 1.025 (ref 1.005–1.030)
Urobilinogen, Ur: 0.2 mg/dL (ref 0.2–1.0)
pH, UA: 5.5 (ref 5.0–7.5)

## 2020-01-13 LAB — MICROSCOPIC EXAMINATION: Bacteria, UA: NONE SEEN

## 2020-01-13 NOTE — Patient Instructions (Addendum)
Your procedure is scheduled on: Tuesday 01/17/20.  Report to THE FIRST FLOOR REGISTRATION DESK IN THE MEDICAL MALL ON THE MORNING OF SURGERY FIRST, THEN YOU WILL CHECK IN AT THE SURGERY INFORMATION DESK LOCATED OUTSIDE THE SAME DAY SURGERY DEPARTMENT LOCATED ON 2ND FLOOR MEDICAL MALL ENTRANCE.  To find out your arrival time please call [phone] between 1PM - 3PM on Monday 01/16/20.   Remember: Instructions that are not followed completely may result in serious medical risk, up to and including death, or upon the discretion of your surgeon and anesthesiologist your surgery may need to be rescheduled.     __X__ 1. Do not eat food after midnight the night before your procedure.                 No gum chewing or hard candies. You may drink clear liquids up to 2 hours                 before you are scheduled to arrive for your surgery- DO NOT drink clear                 liquids within 2 hours of the start of your surgery.                 Clear Liquids include:  water, apple juice without pulp, clear carbohydrate                 drink such as Clearfast or Gatorade, Black Coffee or Tea (Do not add                 milk or creamer to coffee or tea).  __X__2.  On the morning of surgery brush your teeth with toothpaste and water, you may rinse your mouth with mouthwash if you wish.  Do not swallow any toothpaste or mouthwash.    __X__ 3.  No Alcohol for 24 hours before or after surgery.  __X__ 4.  Do Not Smoke or use e-cigarettes For 24 Hours Prior to Your Surgery.                 Do not use any chewable tobacco products for at least 6 hours prior to                 surgery.  __X__5.  Notify your doctor if there is any change in your medical condition      (cold, fever, infections).      Do NOT wear jewelry, make-up, hairpins, clips or nail polish. Do NOT wear lotions, powders, or perfumes.  Do NOT shave 48 hours prior to surgery. Men may shave face and neck. Do NOT bring valuables to the  hospital.     [facility] is not responsible for any belongings or valuables.   Contacts, dentures/partials or body piercings may not be worn into surgery. Bring a case for your contacts, glasses or hearing aids, a denture cup will be supplied.    Patients discharged the day of surgery will not be allowed to drive home.     __X__ Take these medicines the morning of surgery with A SIP OF WATER:     1. albuterol (VENTOLIN HFA)   2. bicalutamide (CASODEX)    __X__ Use inhalers on the day of surgery. Also bring the inhaler with you to the hospital on the morning of surgery.  __X__ Stop Anti-inflammatories 7 days before surgery such as Advil, Ibuprofen, Motrin, BC or Goodies Powder, Naprosyn, Naproxen, Aleve, Aspirin,  Meloxicam. May take Tylenol if needed for pain or discomfort.   __X__Do not start taking any new herbal supplements or vitamins prior to your procedure.     Wear comfortable clothing (specific to your surgery type) to the hospital.  Plan for stool softeners for home use; pain medications have a tendency to cause constipation. You can also help prevent constipation by eating foods high in fiber such as fruits and vegetables and drinking plenty of fluids as your diet allows.  After surgery, you can prevent lung complications by doing breathing exercises.Take deep breaths and cough every 1-2 hours. Your doctor may order a device called an Incentive Spirometer to help you take deep breaths.  Please call the [place] Department at [phone] if you have any questions about these instructions.

## 2020-01-16 ENCOUNTER — Other Ambulatory Visit
Admission: RE | Admit: 2020-01-16 | Discharge: 2020-01-16 | Disposition: A | Discharge status: Home / Self Care | Payer: Medicaid Other | Source: Ambulatory Visit | Attending: Urology | Admitting: Urology

## 2020-01-16 ENCOUNTER — Encounter: Payer: Self-pay | Admitting: Urology

## 2020-01-16 ENCOUNTER — Other Ambulatory Visit: Payer: Self-pay

## 2020-01-16 ENCOUNTER — Other Ambulatory Visit: Payer: Self-pay | Admitting: Urology

## 2020-01-16 DIAGNOSIS — Z20822 Contact with and (suspected) exposure to covid-19: Secondary | ICD-10-CM | POA: Diagnosis not present

## 2020-01-16 DIAGNOSIS — Z01818 Encounter for other preprocedural examination: Secondary | ICD-10-CM | POA: Diagnosis not present

## 2020-01-16 MED ORDER — LACTATED RINGERS IV SOLN: INTRAVENOUS | Status: DC

## 2020-01-16 NOTE — Progress Notes (Signed)
[facility] Perioperative Services  Pre-Admission/Anesthesia Testing Clinical Review  Date: 01/16/20  Patient Demographics:  Name: Daniel Weiss DOB:   1960/11/15 MRN:   [id number]  Planned Surgical Procedure(s):    Case: [id number] Date/Time: 01/17/20 0715   Procedures:      ORCHIECTOMY (Right )     BIOPSY TRANSRECTAL ULTRASONIC PROSTATE (TUBP) (N/A )   Anesthesia type: General   Pre-op diagnosis: right testis mass, elevated prostate specific antigen   Location: [facility] OR ROOM 10 / [place] [facility]S FOR ANESTHESIA GROUP   Surgeons: Abbie Sons, MD     NOTE: Available PAT nursing documentation and vital signs have been reviewed. Clinical nursing staff has updated patient's PMH/PSHx, current medication list, and drug allergies/intolerances to ensure comprehensive history available to assist in medical decision making as it pertains to the aforementioned surgical procedure and anticipated anesthetic course.   Clinical Discussion:  Daniel Weiss is a 59 y.o. male who is submitted for pre-surgical anesthesia review and clearance prior to him undergoing the above procedure. Patient is a Current Smoker (tobacco). He also endorses the use of marijuana at least 3 times per week. Pertinent PMH includes: CAD, NSTEMI (02/2018; s/p PCI), ischemic cardiomyopathy, LBBB (2016), HFrEF, cardiac murmur, HTN, CVA, dyspnea, prostate cancer  Patient is followed by cardiology Ellyn Hack, MD). He was last seen in the cardiology clinic on 11/28/2019; notes reviewed.  At the time of his last clinic visit, patient was reported to be "relatively stable" from a cardiovascular standpoint.  He complained of intermittent mild chest discomfort and exertional dyspnea; required use of SL NTG last week.  Chest discomfort not associated with activity/exertion.  Patient with complaints of lightheadedness and dizziness that was felt to be secondary to orthostasis related to poor dietary/oral fluid intake while on  bicalutamide for his prostate cancer.  Cardiology notes that patient is depressed/dysthymic secondary to not being able to work.  Recent CT imaging reviewed by attending cardiologist demonstrating aortic atherosclerosis as well as evidence of metastatic disease (retrocaval space and right common/external iliac chain LAD, multiple sclerotic bone lesions).  Patient noted that he was extremely fatigue.  Patient suffered an NSTEMI in 02/2018 and underwent PCI that revealed severe single-vessel disease of the proximal-mid LAD; DES x 1 placed. Last TTE performed in 10/2018 revealed moderately reduced systolic function with an LVEF of 35-40%.  Subsequent myocardial perfusion imaging revealed a large defect of moderate severity in the mid anterior, mid anteroseptal, apical anterior, apical septal, apical inferior, and apex location consistent with prior myocardial infarction; no evidence of ischemia; LVEF 35% (see full interpretation of cardiovascular testing below).  HTN and HFrEF symptoms controlled on beta-blocker (metoprolol), loop diuretic (furosemide), and ARNi (sacubitril-valsartan) therapy.  ECG in clinic that day was stable.  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  Recent CT imaging showed resolution of thrombus.  Cardiology discontinue warfarin and restarted patient on clopidogrel.  Patient scheduled to follow-up with cardiology in 3 months.  Patient is scheduled to undergo transrectal prostate biopsy and RIGHT orchiectomy on 01/17/2020 with Dr. John Giovanni.  Given patient's past medical history significant for cardiovascular issues, presurgical cardiac clearance was sought by the attending surgeons office.  Per cardiology, "based on ACC/AHA guidelines, the patient would be at acceptable risk for the planned procedure without further cardiovascular testing". This patient is on daily antiplatelet therapy. He has been instructed on recommendations for  holding his clopidogrel for 5 days prior to his procedure.  He will resume this medication as soon as possible after his procedure per Dr. Dene Gentry discretion. The patient has been instructed that his last dose of his anticoagulant will be on 01/11/2020.  He denies previous perioperative complications with anesthesia.  Vitals with BMI 01/09/2020 12/22/2019 12/08/2019  Height 6\' 0"  - -  Weight 149 lbs 149 lbs 2 oz 146 lbs 10 oz  BMI 20.2 93.81 01.75  Systolic 102 585 277  Diastolic 76 83 93  Pulse 70 88 77    Providers/Specialists:   NOTE: Primary physician provider listed below. Patient may have been seen by APP or partner within same practice.   PROVIDER ROLE LAST Claud Kelp, MD Urology (Surgeon) 01/09/2020  Azzie Glatter, FNP Primary Care Provider 05/24/2019  Glenetta Hew, MD Cardiology 12/01/2019  Earlie Server, MD Oncology 12/22/2019   Allergies:  Patient has no known allergies.  Current Home Medications:   No current facility-administered medications for this encounter.   Marland Kitchen albuterol (VENTOLIN HFA) 108 (90 Base) MCG/ACT inhaler  . bicalutamide (CASODEX) 50 MG tablet  . furosemide (LASIX) 20 MG tablet  . metoprolol succinate (TOPROL-XL) 25 MG 24 hr tablet  . nitroGLYCERIN (NITROSTAT) 0.4 MG SL tablet  . ondansetron (ZOFRAN) 4 MG tablet  . rosuvastatin (CRESTOR) 40 MG tablet  . sacubitril-valsartan (ENTRESTO) 24-26 MG  . tamsulosin (FLOMAX) 0.4 MG CAPS capsule  . clopidogrel (PLAVIX) 75 MG tablet  . venlafaxine XR (EFFEXOR XR) 37.5 MG 24 hr capsule   History:   Past Medical History:  Diagnosis Date  . Abnormal chest CT 02/2018   a. Ectatic 3 cm Ao arch - rec f/u outpt imaging w/ CTA or MRA. Ectatic atheromatous abd Ao @ risk for aneurysm - rec f/u u/s in 5 yrs. Marked prostatic enlargement w/ scattered small sclerotic foci in the thoracic lower lumbar spine, sacrum, left 12th rib, pelvis, and right femur.  Recommend elective outpatient whole-body bone  scintigraphy and PSA.  Marland Kitchen CAD (coronary artery disease)    a. 02/2018 ACS/PCI: LM nl, LAD 85p (3.5x15 Sierra DES), D1 45ost, RI 55ost, RCA nl, EF 25-35%.  . Cardiac murmur   . Complete left bundle branch block (LBBB) 2016  . Dyspnea   . HFrEF (heart failure with reduced ejection fraction) ([place])   . Hypertension   . Ischemic cardiomyopathy 02/2018   a. 02/2018 Echo: EF 35-40% (LV gram on cath was 25-30%), mod, diff HK. mid-apicalanteroseptal, ant, and apical sev HK. RVH.-->  No notable change on echo from June 2020 and August 2020.  Marland Kitchen Myocardial infarction ([place])   . NSTEMI (non-ST elevated myocardial infarction) ([place]) 02/2018   85% pLAD - DES PCI   . Prostate cancer metastatic to multiple sites [facility]) 11/2018  . Stroke ([place])   . TIA (transient ischemic attack) 2016  . Warthin's tumor    Past Surgical History:  Procedure Laterality Date  . CORONARY STENT INTERVENTION N/A 03/06/2018   Procedure: CORONARY STENT INTERVENTION;  Surgeon: [name], MD;  Location: [place] [facility];  Service: Cardiovascular: Proximal LAD 85% stenosis (and D1): DES PCI with Xience Sierra DES 3.5 mm x 15 mm - 3.9 mm  . INTRAOPERATIVE TRANSTHORACIC ECHOCARDIOGRAM  03/06/2018   (Peri-MI) mild reduced EF 35-40%.  Diffuse hypokinesis (severe HK of the mid-apical anteroseptal, anterior and apical myocardium consistent with LAD infarct).  Unable to assess diastolic function.  Paradoxical septal motion likely related to his LBBB.  RV hypertrophy noted.  Trivial pericardial effusion noted.   Marland Kitchen  LEFT HEART CATH AND CORONARY ANGIOGRAPHY N/A 03/06/2018   Procedure: LEFT HEART CATH AND CORONARY ANGIOGRAPHY;  Surgeon: [name], MD;  Location: [place] [facility];  Service: Cardiovascular: Proximal LAD 85% involving D1 45%.  Ostial RI 55%.  Severe LV dysfunction EF 25 to 30%.  1+ MR.  Only minimally elevated LVEDP.  Marland Kitchen TRANSTHORACIC ECHOCARDIOGRAM  10/2018   (Most recent echo, to evaluate for TIA/CVA) : Moderate  reduced EF 35 to 40%.  Moderate concentric LVH.  Mild dyskinesis of the apex and severe hypokinesis of mid apical anteroseptal and anterior wall.  Unable to assess RV pressures.  Aortic sclerosis with no stenosis.  No significant change seen   Family History  Problem Relation Age of Onset  . Stroke Mother   . Hypertension Mother   . Diabetes type II Mother   . Leukemia Other   . Breast cancer Other   . Stroke Brother   . CAD Neg Hx    Social History   Tobacco Use  . Smoking status: Current Some Day Smoker    Packs/day: 0.00    Years: 0.00    Pack years: 0.00    Types: Cigarettes  . Smokeless tobacco: Never Used  . Tobacco comment: quit three days ago  Vaping Use  . Vaping Use: Never used  Substance Use Topics  . Alcohol use: Yes    Alcohol/week: 2.0 standard drinks    Types: 2 Cans of beer per week    Comment: 2 Beers daily.  . Drug use: Yes    Frequency: 3.0 times per week    Types: Marijuana    Pertinent Clinical Results:  LABS: Labs reviewed: Acceptable for surgery.     ECG: Date: 11/28/2019 Time ECG obtained: 1025 AM Rate: 92 bpm Rhythm: normal sinus; LBBB Intervals: PR 144 ms. QRS 138 ms. QTc 457 ms. ST segment and T wave changes: No evidence of acute ST segment elevation or depression Comparison: No acute/significant changed when compared to previous tracing obtained on 07/26/2019   IMAGING / PROCEDURES: LEXISCAN done on 12/06/2019  1. Left-ventricular ejection fraction was mildly decreased at 30-44% 2. Nuclear stress EF 35% 3. There is a large defect of moderate severity present in the mid anterior, mid anteroseptal, apical anterior, apical septal, apical inferior, and apex location 4. Findings consistent with prior myocardial infarction 5. There is no evidence of ischemia 6. This is an intermittent risk study due to reduced systolic function  ECHOCARDIOGRAM done on 11/04/2019 1. Mild dyskinesis of the left ventricular, entire apical segment.   2. Severe hypokinesis of the left ventricular, mid-apical anteroseptal wall and anterior wall.  3. The left ventricle has moderately reduced systolic function, with an ejection fraction of 35-40%. The cavity size was normal. There is moderate  concentric left ventricular hypertrophy. Left ventricular diastolic doppler parameters are consistent withimpaired relaxation. There is abnormal septal motion consistent with left bundle branch block. No evidence of left ventricular regional wall motion  abnormalities.  4. The right ventricle has normal systolic function. The cavity was normal. There is no increase in right ventricular wall thickness. Right ventricular systolic pressure could not be assessed.  5. Sclerosis without any evidence of stenosis of the aortic valve. Aortic valve regurgitation is trivial by color flow Doppler. The aorta is normal unless otherwise noted. The aortic root is normal in size and structure.  6. When compared to the prior study: 08/16/2018, no significant change is seen.   LEFT HEART CATHETERIZATION AND CORONARY ANGIOGRAPHY  done on 02/26/2018 1. Severe (80-85%) single-vessel disease of the proximal-mid LAD just after D1 with mild D1 involvement treated with Xience DES 3.5 mm x 15 mm (3.9 mm) 2. Moderate ostial ramus intermedius disease and mild to moderate ostial D1 disease. 3. Moderate to severely reduced LVEF (25-35%) with mid anterior and inferior severe hypokinesis and apical inferior and anterior akinesis -consistent with a recent anterior MI. 4. Despite reduced EF, minimally elevated LV EDP.   Impression and Plan:  Daniel Weiss has been referred for pre-anesthesia review and clearance prior to him undergoing the planned anesthetic and procedural courses. Available labs, pertinent testing, and imaging results were personally reviewed by me. This patient has been appropriately cleared by cardiology.   Based on clinical review performed today (01/16/20), barring any  significant acute changes in the patient's overall condition, it is anticipated that he will be able to proceed with the planned surgical intervention. Any acute changes in clinical condition may necessitate his procedure being postponed and/or cancelled. Pre-surgical instructions were reviewed with the patient during his PAT appointment and questions were fielded by PAT clinical staff.  Honor Loh, MSN, APRN, FNP-C, CEN [facility]  Peri-operative Services Nurse Practitioner Phone: [phone] 01/16/20 10:30 AM  NOTE: This note has been prepared using Dragon dictation software. Despite my best ability to proofread, there is always the potential that unintentional transcriptional errors may still occur from this process.

## 2020-01-17 ENCOUNTER — Encounter
Admission: RE | Disposition: A | Discharge status: Home / Self Care | Payer: Self-pay | Source: Home / Self Care | Attending: Urology

## 2020-01-17 ENCOUNTER — Ambulatory Visit: Payer: Medicaid Other | Admitting: Urgent Care

## 2020-01-17 ENCOUNTER — Encounter: Payer: Self-pay | Admitting: Urology

## 2020-01-17 ENCOUNTER — Telehealth: Payer: Self-pay | Admitting: Urology

## 2020-01-17 ENCOUNTER — Ambulatory Visit
Admission: RE | Admit: 2020-01-17 | Discharge: 2020-01-17 | Disposition: A | Discharge status: Home / Self Care | Payer: Medicaid Other | Attending: Urology | Admitting: Urology

## 2020-01-17 ENCOUNTER — Ambulatory Visit
Admission: RE | Admit: 2020-01-17 | Discharge: 2020-01-17 | Disposition: A | Discharge status: Home / Self Care | Payer: Medicaid Other | Source: Ambulatory Visit | Attending: Urology | Admitting: Urology

## 2020-01-17 DIAGNOSIS — Z955 Presence of coronary angioplasty implant and graft: Secondary | ICD-10-CM | POA: Insufficient documentation

## 2020-01-17 DIAGNOSIS — C772 Secondary and unspecified malignant neoplasm of intra-abdominal lymph nodes: Secondary | ICD-10-CM | POA: Insufficient documentation

## 2020-01-17 DIAGNOSIS — I25119 Atherosclerotic heart disease of native coronary artery with unspecified angina pectoris: Secondary | ICD-10-CM | POA: Insufficient documentation

## 2020-01-17 DIAGNOSIS — N5089 Other specified disorders of the male genital organs: Secondary | ICD-10-CM | POA: Diagnosis not present

## 2020-01-17 DIAGNOSIS — I252 Old myocardial infarction: Secondary | ICD-10-CM | POA: Diagnosis not present

## 2020-01-17 DIAGNOSIS — I110 Hypertensive heart disease with heart failure: Secondary | ICD-10-CM | POA: Insufficient documentation

## 2020-01-17 DIAGNOSIS — R972 Elevated prostate specific antigen [PSA]: Secondary | ICD-10-CM

## 2020-01-17 DIAGNOSIS — I255 Ischemic cardiomyopathy: Secondary | ICD-10-CM | POA: Diagnosis not present

## 2020-01-17 DIAGNOSIS — I5022 Chronic systolic (congestive) heart failure: Secondary | ICD-10-CM | POA: Diagnosis not present

## 2020-01-17 DIAGNOSIS — F341 Dysthymic disorder: Secondary | ICD-10-CM | POA: Diagnosis not present

## 2020-01-17 DIAGNOSIS — Z8673 Personal history of transient ischemic attack (TIA), and cerebral infarction without residual deficits: Secondary | ICD-10-CM | POA: Insufficient documentation

## 2020-01-17 DIAGNOSIS — Z79899 Other long term (current) drug therapy: Secondary | ICD-10-CM | POA: Insufficient documentation

## 2020-01-17 DIAGNOSIS — C61 Malignant neoplasm of prostate: Secondary | ICD-10-CM

## 2020-01-17 DIAGNOSIS — M898X8 Other specified disorders of bone, other site: Secondary | ICD-10-CM | POA: Insufficient documentation

## 2020-01-17 DIAGNOSIS — N500 Atrophy of testis: Secondary | ICD-10-CM | POA: Insufficient documentation

## 2020-01-17 DIAGNOSIS — Z7902 Long term (current) use of antithrombotics/antiplatelets: Secondary | ICD-10-CM | POA: Diagnosis not present

## 2020-01-17 DIAGNOSIS — C801 Malignant (primary) neoplasm, unspecified: Secondary | ICD-10-CM

## 2020-01-17 DIAGNOSIS — D4011 Neoplasm of uncertain behavior of right testis: Secondary | ICD-10-CM

## 2020-01-17 DIAGNOSIS — F17210 Nicotine dependence, cigarettes, uncomplicated: Secondary | ICD-10-CM | POA: Diagnosis not present

## 2020-01-17 DIAGNOSIS — Z803 Family history of malignant neoplasm of breast: Secondary | ICD-10-CM | POA: Insufficient documentation

## 2020-01-17 HISTORY — PX: PROSTATE BIOPSY: SHX241

## 2020-01-17 HISTORY — PX: ORCHIECTOMY: SHX2116

## 2020-01-17 HISTORY — DX: Cardiac murmur, unspecified: R01.1

## 2020-01-17 LAB — URINE DRUG SCREEN, QUALITATIVE (ARMC ONLY)
Amphetamines, Ur Screen: NOT DETECTED
Barbiturates, Ur Screen: NOT DETECTED
Benzodiazepine, Ur Scrn: NOT DETECTED
Cannabinoid 50 Ng, Ur ~~LOC~~: POSITIVE — AB
Cocaine Metabolite,Ur ~~LOC~~: NOT DETECTED
MDMA (Ecstasy)Ur Screen: NOT DETECTED
Methadone Scn, Ur: NOT DETECTED
Opiate, Ur Screen: NOT DETECTED
Phencyclidine (PCP) Ur S: NOT DETECTED
Tricyclic, Ur Screen: NOT DETECTED

## 2020-01-17 LAB — SARS CORONAVIRUS 2 (TAT 6-24 HRS): SARS Coronavirus 2: NEGATIVE

## 2020-01-17 SURGERY — ORCHIECTOMY
Anesthesia: General | Laterality: Right

## 2020-01-17 MED ORDER — OXYCODONE HCL 5 MG/5ML PO SOLN: 5.0000 mg | Freq: Once | ORAL | Status: DC | PRN

## 2020-01-17 MED ORDER — OXYCODONE-ACETAMINOPHEN 5-325 MG PO TABS
1.0000 | ORAL_TABLET | Freq: Four times a day (QID) | ORAL | 0 refills | Status: DC | PRN
Start: 2020-01-17 — End: 2020-02-28

## 2020-01-17 MED ORDER — BUPIVACAINE HCL 0.5 % IJ SOLN
INTRAMUSCULAR | Status: DC | PRN
  Administered 2020-01-17: 8 mL

## 2020-01-17 MED ORDER — PROPOFOL 10 MG/ML IV BOLUS
INTRAVENOUS | Status: DC | PRN
  Administered 2020-01-17: 90 mg via INTRAVENOUS
  Administered 2020-01-17: 20 mg via INTRAVENOUS
  Administered 2020-01-17: 40 mg via INTRAVENOUS
  Administered 2020-01-17: 30 mg via INTRAVENOUS

## 2020-01-17 MED ORDER — PHENYLEPHRINE HCL (PRESSORS) 10 MG/ML IV SOLN
INTRAVENOUS | Status: DC | PRN
  Administered 2020-01-17 (×2): 100 ug via INTRAVENOUS
  Administered 2020-01-17: 200 ug via INTRAVENOUS
  Administered 2020-01-17 (×5): 100 ug via INTRAVENOUS
  Administered 2020-01-17 (×2): 200 ug via INTRAVENOUS

## 2020-01-17 MED ORDER — CHLORHEXIDINE GLUCONATE 0.12 % MT SOLN: 15.0000 mL | Freq: Once | OROMUCOSAL | Status: AC

## 2020-01-17 MED ORDER — CHLORHEXIDINE GLUCONATE 0.12 % MT SOLN
OROMUCOSAL | Status: AC
  Administered 2020-01-17: 15 mL via OROMUCOSAL
  Filled 2020-01-17: qty 15

## 2020-01-17 MED ORDER — LIDOCAINE HCL (PF) 2 % IJ SOLN
INTRAMUSCULAR | Status: AC
  Filled 2020-01-17: qty 5

## 2020-01-17 MED ORDER — FENTANYL CITRATE (PF) 100 MCG/2ML IJ SOLN: 25.0000 ug | INTRAMUSCULAR | Status: DC | PRN

## 2020-01-17 MED ORDER — MIDAZOLAM HCL 2 MG/2ML IJ SOLN
INTRAMUSCULAR | Status: DC | PRN
  Administered 2020-01-17: 2 mg via INTRAVENOUS

## 2020-01-17 MED ORDER — SODIUM CHLORIDE 0.9 % IV SOLN
1.0000 g | INTRAVENOUS | Status: AC
  Administered 2020-01-17: 1 g via INTRAVENOUS
  Filled 2020-01-17: qty 1

## 2020-01-17 MED ORDER — DEXAMETHASONE SODIUM PHOSPHATE 10 MG/ML IJ SOLN
INTRAMUSCULAR | Status: DC | PRN
  Administered 2020-01-17: 10 mg via INTRAVENOUS

## 2020-01-17 MED ORDER — MIDAZOLAM HCL 2 MG/2ML IJ SOLN
INTRAMUSCULAR | Status: AC
  Filled 2020-01-17: qty 2

## 2020-01-17 MED ORDER — PROPOFOL 10 MG/ML IV BOLUS
INTRAVENOUS | Status: AC
  Filled 2020-01-17: qty 20

## 2020-01-17 MED ORDER — ONDANSETRON HCL 4 MG/2ML IJ SOLN
INTRAMUSCULAR | Status: DC | PRN
  Administered 2020-01-17: 4 mg via INTRAVENOUS

## 2020-01-17 MED ORDER — OXYCODONE HCL 5 MG PO TABS: 5.0000 mg | ORAL_TABLET | Freq: Once | ORAL | Status: DC | PRN

## 2020-01-17 MED ORDER — SODIUM CHLORIDE 0.9 % IV SOLN: INTRAVENOUS | Status: DC

## 2020-01-17 MED ORDER — LIDOCAINE HCL (CARDIAC) PF 100 MG/5ML IV SOSY
PREFILLED_SYRINGE | INTRAVENOUS | Status: DC | PRN
  Administered 2020-01-17: 60 mg via INTRAVENOUS

## 2020-01-17 MED ORDER — FENTANYL CITRATE (PF) 100 MCG/2ML IJ SOLN
INTRAMUSCULAR | Status: AC
  Filled 2020-01-17: qty 2

## 2020-01-17 MED ORDER — FENTANYL CITRATE (PF) 100 MCG/2ML IJ SOLN
INTRAMUSCULAR | Status: DC | PRN
  Administered 2020-01-17 (×8): 25 ug via INTRAVENOUS

## 2020-01-17 MED ORDER — FAMOTIDINE 20 MG PO TABS
ORAL_TABLET | ORAL | Status: AC
  Administered 2020-01-17: 20 mg via ORAL
  Filled 2020-01-17: qty 1

## 2020-01-17 MED ORDER — ONDANSETRON HCL 4 MG/2ML IJ SOLN
INTRAMUSCULAR | Status: AC
  Filled 2020-01-17: qty 2

## 2020-01-17 MED ORDER — ACETAMINOPHEN 10 MG/ML IV SOLN: 1000.0000 mg | Freq: Once | INTRAVENOUS | Status: DC | PRN

## 2020-01-17 MED ORDER — DEXAMETHASONE SODIUM PHOSPHATE 10 MG/ML IJ SOLN
INTRAMUSCULAR | Status: AC
  Filled 2020-01-17: qty 1

## 2020-01-17 MED ORDER — FAMOTIDINE 20 MG PO TABS: 20.0000 mg | ORAL_TABLET | Freq: Once | ORAL | Status: AC

## 2020-01-17 MED ORDER — ORAL CARE MOUTH RINSE: 15.0000 mL | Freq: Once | OROMUCOSAL | Status: AC

## 2020-01-17 MED ORDER — BUPIVACAINE HCL (PF) 0.5 % IJ SOLN
INTRAMUSCULAR | Status: AC
  Filled 2020-01-17: qty 30

## 2020-01-17 SURGICAL SUPPLY — 56 items
ADH SKN CLS APL DERMABOND .7 (GAUZE/BANDAGES/DRESSINGS) ×2
APL PRP STRL LF DISP 70% ISPRP (MISCELLANEOUS) ×2
BLADE SURG 15 STRL LF DISP TIS (BLADE) ×2 IMPLANT
BLADE SURG 15 STRL SS (BLADE) ×4
CANISTER SUCT 1200ML W/VALVE (MISCELLANEOUS) ×4 IMPLANT
CHLORAPREP W/TINT 26 (MISCELLANEOUS) ×4 IMPLANT
CLOSURE WOUND 1/2 X4 (GAUZE/BANDAGES/DRESSINGS)
CNTNR SPEC 2.5X3XGRAD LEK (MISCELLANEOUS)
CONT SPEC 4OZ STER OR WHT (MISCELLANEOUS)
CONT SPEC 4OZ STRL OR WHT (MISCELLANEOUS)
CONTAINER SPEC 2.5X3XGRAD LEK (MISCELLANEOUS) IMPLANT
COVER WAND RF STERILE (DRAPES) ×4 IMPLANT
DERMABOND ADVANCED (GAUZE/BANDAGES/DRESSINGS) ×2
DERMABOND ADVANCED .7 DNX12 (GAUZE/BANDAGES/DRESSINGS) ×2 IMPLANT
DRAIN PENROSE 1/4X12 LTX STRL (WOUND CARE) ×4 IMPLANT
DRAIN PENROSE 5/8X18 LTX STRL (WOUND CARE) ×4 IMPLANT
DRAPE LAPAROTOMY 77X122 PED (DRAPES) ×4 IMPLANT
DRSG GAUZE FLUFF 36X18 (GAUZE/BANDAGES/DRESSINGS) ×4 IMPLANT
DRSG TEGADERM 4X4.75 (GAUZE/BANDAGES/DRESSINGS) IMPLANT
DRSG TELFA 4X3 1S NADH ST (GAUZE/BANDAGES/DRESSINGS) IMPLANT
ELECT REM PT RETURN 9FT ADLT (ELECTROSURGICAL) ×4
ELECTRODE REM PT RTRN 9FT ADLT (ELECTROSURGICAL) ×2 IMPLANT
GLOVE BIOGEL PI IND STRL 7.5 (GLOVE) ×2 IMPLANT
GLOVE BIOGEL PI INDICATOR 7.5 (GLOVE) ×2
GOWN STRL REUS W/ TWL LRG LVL3 (GOWN DISPOSABLE) ×4 IMPLANT
GOWN STRL REUS W/ TWL XL LVL3 (GOWN DISPOSABLE) ×2 IMPLANT
GOWN STRL REUS W/TWL LRG LVL3 (GOWN DISPOSABLE) ×8
GOWN STRL REUS W/TWL XL LVL3 (GOWN DISPOSABLE) ×4
KIT TURNOVER KIT A (KITS) ×4 IMPLANT
LABEL OR SOLS (LABEL) ×4 IMPLANT
MANIFOLD NEPTUNE II (INSTRUMENTS) ×4 IMPLANT
NDL HPO THNWL 1X22GA REG BVL (NEEDLE) IMPLANT
NDL SAFETY ECLIPSE 18X1.5 (NEEDLE) ×2 IMPLANT
NEEDLE HYPO 18GX1.5 SHARP (NEEDLE) ×4
NEEDLE HYPO 22GX1.5 SAFETY (NEEDLE) ×4 IMPLANT
NEEDLE HYPO 25GX1X1/2 BEV (NEEDLE) ×4 IMPLANT
NEEDLE SAFETY 22GX1 (NEEDLE)
PACK BASIN MINOR (MISCELLANEOUS) ×4 IMPLANT
SPONGE KITTNER 5P (MISCELLANEOUS) IMPLANT
STRIP CLOSURE SKIN 1/2X4 (GAUZE/BANDAGES/DRESSINGS) IMPLANT
SUPPORETR ATHLETIC LG (MISCELLANEOUS) ×2 IMPLANT
SUPPORTER ATHLETIC LG (MISCELLANEOUS) ×4
SUT CHROMIC GUT BROWN 0 54 (SUTURE) IMPLANT
SUT CHROMIC GUT BROWN 0 54IN (SUTURE)
SUT ETHIBOND 0 (SUTURE) ×4 IMPLANT
SUT MNCRL 4-0 (SUTURE) ×4
SUT MNCRL 4-0 27XMFL (SUTURE) ×2
SUT SILK 0 SH 30 (SUTURE) ×8 IMPLANT
SUT SILK 2 0 SH (SUTURE) ×4 IMPLANT
SUT VIC AB 3-0 SH 27 (SUTURE) ×4
SUT VIC AB 3-0 SH 27X BRD (SUTURE) ×2 IMPLANT
SUT VIC AB 4-0 FS2 27 (SUTURE) ×4 IMPLANT
SUTURE MNCRL 4-0 27XMF (SUTURE) ×2 IMPLANT
SYR 10ML LL (SYRINGE) IMPLANT
SYR 20ML LL LF (SYRINGE) ×4 IMPLANT
WATER STERILE IRR 1000ML POUR (IV SOLUTION) ×4 IMPLANT

## 2020-01-17 NOTE — Interval H&P Note (Signed)
History and Physical Interval Note:  01/17/2020 7:18 AM  Daniel Weiss  has presented today for surgery, with the diagnosis of right testis mass, elevated prostate specific antigen.  The various methods of treatment have been discussed with the patient and family. After consideration of risks, benefits and other options for treatment, the patient has consented to  Procedure(s): ORCHIECTOMY (Right) BIOPSY TRANSRECTAL ULTRASONIC PROSTATE (TUBP) (N/A) as a surgical intervention.  The patient's history has been reviewed, patient examined, no change in status, stable for surgery.  I have reviewed the patient's chart and labs.  Questions were answered to the patient's satisfaction.     [place]

## 2020-01-17 NOTE — Anesthesia Preprocedure Evaluation (Addendum)
Anesthesia Evaluation  Patient identified by MRN, date of birth, ID band Patient awake  General Assessment Comment:Never had anesthesia before  Reviewed: Allergy & Precautions, NPO status , Patient's Chart, lab work & pertinent test results  History of Anesthesia Complications Negative for: history of anesthetic complications  Airway Mallampati: II  TM Distance: >3 FB Neck ROM: Full    Dental no notable dental hx. (+) Poor Dentition, Missing   Pulmonary shortness of breath and with exertion, asthma , neg sleep apnea, neg COPD, Current Smoker and Patient abstained from smoking.,  Seems to be suboptimally controlled asthma - says he has to take inhalers when he walks long periods because he gets wheezy. Never hospitalized for asthma attack. Took his albuterol this AM   Pulmonary exam normal breath sounds clear to auscultation       Cardiovascular Exercise Tolerance: Poor METShypertension, + angina + CAD, + Past MI, + Cardiac Stents and + DOE  (-) dysrhythmias  Rhythm:Regular Rate:Normal - Systolic murmurs LEXISCAN done on 12/06/2019  1. Left-ventricular ejection fraction was mildly decreased at 30-44% 2. Nuclear stress EF 35% 3. There is a large defect of moderate severity present in the mid anterior, mid anteroseptal, apical anterior, apical septal, apical inferior, and apex location 4. Findings consistent with prior myocardial infarction 5. There is no evidence of ischemia 6. This is an intermittent risk study due to reduced systolic function  ECHOCARDIOGRAM done on 11/04/2019 1. Mild dyskinesis of the left ventricular, entire apical segment.  2. Severe hypokinesis of the left ventricular, mid-apical anteroseptal wall and anterior wall.  3. The left ventricle has moderately reduced systolic function, with an ejection fraction of 35-40%. The cavity size was normal. There is moderate  concentric left ventricular hypertrophy. Left  ventricular diastolic doppler parameters are consistent withimpaired relaxation. There is abnormal septal motion consistent with left bundle branch block. No evidence of left ventricular regional wall motion  abnormalities.  4. The right ventricle has normal systolic function. The cavity was normal. There is no increase in right ventricular wall thickness. Right ventricular systolic pressure could not be assessed.  5. Sclerosis without any evidence of stenosis of the aortic valve. Aortic valve regurgitation is trivial by color flow Doppler. The aorta is normal unless otherwise noted. The aortic root is normal in size and structure.  6. When compared to the prior study: 08/16/2018, no significant change is seen.   LEFT HEART CATHETERIZATION AND CORONARY ANGIOGRAPHY done on 02/26/2018 1. Severe (80-85%) single-vessel disease of the proximal-mid LAD just after D1 with mild D1 involvement treated with Xience DES 3.5 mm x 15 mm (3.9 mm) 2. Moderate ostial ramus intermedius disease and mild to moderate ostial D1 disease. 3. Moderate to severely reduced LVEF (25-35%) with mid anterior and inferior severe hypokinesis and apical inferior and anterior akinesis -consistent with a recent anterior MI. 4. Despite reduced EF, minimally elevated LV EDP.   Neuro/Psych  Headaches, CVA, No Residual Symptoms negative psych ROS   GI/Hepatic neg GERD  ,(+)     (-) substance abuse  ,   Endo/Other  neg diabetes  Renal/GU negative Renal ROS     Musculoskeletal   Abdominal   Peds  Hematology   Anesthesia Other Findings Past Medical History: 02/2018: Abnormal chest CT     Comment:  a. Ectatic 3 cm Ao arch - rec f/u outpt imaging w/ CTA               or MRA. Ectatic atheromatous abd Ao @  risk for aneurysm -              rec f/u u/s in 5 yrs. Marked prostatic enlargement w/               scattered small sclerotic foci in the thoracic lower               lumbar spine, sacrum, left 12th rib, pelvis, and  right               femur.  Recommend elective outpatient whole-body bone               scintigraphy and PSA. No date: CAD (coronary artery disease)     Comment:  a. 02/2018 ACS/PCI: LM nl, LAD 85p (3.5x15 Sierra DES),               D1 45ost, RI 55ost, RCA nl, EF 25-35%. No date: Cardiac murmur 2016: Complete left bundle branch block (LBBB) No date: Dyspnea No date: HFrEF (heart failure with reduced ejection fraction) ([place]) No date: Hypertension 02/2018: Ischemic cardiomyopathy     Comment:  a. 02/2018 Echo: EF 35-40% (LV gram on cath was 25-30%),              mod, diff HK. mid-apicalanteroseptal, ant, and apical sev              HK. RVH.-->  No notable change on echo from June 2020 and              August 2020. No date: Myocardial infarction [facility]) 02/2018: NSTEMI (non-ST elevated myocardial infarction) ([place])     Comment:  85% pLAD - DES PCI  11/2018: Prostate cancer metastatic to multiple sites [facility]) No date: Stroke [facility]) 2016: TIA (transient ischemic attack) No date: Warthin's tumor  Reproductive/Obstetrics                            Anesthesia Physical Anesthesia Plan  ASA: III  Anesthesia Plan: General   Post-op Pain Management:    Induction: Intravenous  PONV Risk Score and Plan: 2 and Ondansetron, Dexamethasone and Midazolam  Airway Management Planned: LMA  Additional Equipment: None  Intra-op Plan:   Post-operative Plan: Extubation in OR  Informed Consent: I have reviewed the patients History and Physical, chart, labs and discussed the procedure including the risks, benefits and alternatives for the proposed anesthesia with the patient or authorized representative who has indicated his/her understanding and acceptance.     Dental advisory given  Plan Discussed with: CRNA and Surgeon  Anesthesia Plan Comments: (Discussed risks of anesthesia with patient, including PONV, sore throat, lip/dental damage. Rare risks discussed as well,  such as cardiorespiratory and neurological sequelae. Patient understands.)        Anesthesia Quick Evaluation

## 2020-01-17 NOTE — Telephone Encounter (Signed)
App made patient is aware 

## 2020-01-17 NOTE — Op Note (Signed)
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  Prostate volume 28 cc 2.  No echogenic abnormalities peripheral zone 3.  Small right testis mass  EBL: Minimal  Specimens: 12 core prostate biopsies Right testis with spermatic cord  Indication: Daniel Weiss is a 59 y.o. diagnosed with presumed prostate cancer August 2020 with PSA 509 and treated with ADT.  Prostate biopsy was never performed.  Recent CT ordered by medical oncology remarkable for right external/common iliac and pericaval adenopathy.  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  Subsequent scrotal ultrasound showed a small right testis mass with internal calcification.  After reviewing the management options for treatment, he elected to proceed with the above surgical procedure(s). We have discussed the potential benefits and risks of the procedure, side effects of the proposed treatment, the likelihood of the patient achieving the goals of the procedure, and any potential problems that might occur during the procedure or recuperation. Informed consent has been obtained.  Description of procedure:  The patient was taken to the operating room and general anesthesia was induced on the stretcher.  He was then placed in the left lateral decubitus position with knees to chest. Preoperative antibiotics were administered. A preoperative time-out was performed.      A transrectal ultrasound probe was lubricated and gently passed per rectum.  Imaging was performed with findings as described above.  Standard 12 core biopsies were then performed in the usual fashion.  No bleeding was noted.  He was then placed  back in the supine position and transferred to the OR table.  His lower abdomen and external genitalia were prepped and draped in the usual fashion.   The halfway point between the anterior superior iliac spine and pubic tubercle was marked and a transverse incision was made overlying the approximate area of the internal inguinal ring.  The incision was carried through Scarpa's fascia to expose the fascia of the external oblique.  The external iliac vein was identified and the external oblique fascia was opened proximally with Metzenbaum scissors.  The ilioinguinal nerve was identified, dissected free and retracted medially with the fascia of the external oblique.  The spermatic cord was mobilized at the pubic tubercle and a Penrose drain was passed beneath and encircled x2 and the Penrose was clamped with a hemostat.  A finger was inserted through the incision into the scrotal area to bluntly develop the area and the scrotum was then invaginated and the testis was delivered in the operative field.  It was separated from the gubernacular fibers with a combination of cautery and blunt dissection.  The most distal gubernacular attachments were clamped with a hemostat, divided with cautery with remaining portion ligated with an 0 chromic free tie.  Once the testis was completely freed the spermatic cord was dissected proximally back to the external inguinal ring.  The cord was taken in 2 bites encompassing spermatic cord vessels and vas deferens.  The distal aspect of the cord was clamped with a Claiborne Billings and divided.  The vas was ligated with 0 Ethibond suture ligature the spermatic cord vessels were ligated with an 0 Ethibond suture ligature and a second 0 Ethibond free tie.  No bleeding was noted.  The inguinal incision was copiously irrigated  with sterile saline.  The scrotum was invaginated and hemostasis was noted to be adequate.  The ilioinguinal nerve was returned to its anatomic position.  Scarpa's fascia was  closed with a running 4-0 Vicryl suture.  Scarpa's fascia was closed with interrupted 4-0 Vicryl suture.  And subcutaneous tissue was approximated with 4-0 Vicryl interrupted suture.  The skin edges were anesthetized with 8 mL of 0.25% bupivacaine.  Skin was closed with a running subcuticular suture of 4-0 Monocryl.  A dressing of Dermabond was applied.  After anesthetic reversal he was transported the PACU in stable condition.   Plan: He will be contacted with pathology results and further recommendations    Abbie Sons, M.D.

## 2020-01-17 NOTE — Transfer of Care (Signed)
Immediate Anesthesia Transfer of Care Note  Patient: [name]  Procedure(s) Performed: ORCHIECTOMY (Right ) BIOPSY TRANSRECTAL ULTRASONIC PROSTATE (TUBP) (N/A )  Patient Location: PACU  Anesthesia Type:General  Level of Consciousness: awake, alert  and patient cooperative  Airway & Oxygen Therapy: Patient Spontanous Breathing and Patient connected to face mask oxygen  Post-op Assessment: Report given to RN and Post -op Vital signs reviewed and stable  Post vital signs: Reviewed and stable  Last Vitals:  Vitals Value Taken Time  BP 139/87 01/17/20 0938  Temp    Pulse 79 01/17/20 0940  Resp 14 01/17/20 0940  SpO2 100 % 01/17/20 0940  Vitals shown include unvalidated device data.  Last Pain:  Vitals:   01/17/20 0617  TempSrc: Oral  PainSc: 0-No pain         Complications: No complications documented.

## 2020-01-17 NOTE — Discharge Instructions (Signed)
   Activity:  You are encouraged to ambulate frequently (about every hour during waking hours) to help prevent blood clots from forming in your legs or lungs.  However, you should not engage in any heavy lifting (> 5-10 lbs), strenuous activity, or straining for 4 weeks.   Diet: You should advance your diet as instructed by your physician.  It will be normal to have some bloating, nausea, and abdominal discomfort intermittently.   Prescriptions:  You will be provided a prescription for pain medication to take as needed.  If your pain is not severe enough to require the prescription pain medication, you may take extra strength Tylenol instead which will have less side effects.  You should also take a prescribed stool softener to avoid straining with bowel movements as the prescription pain medication may constipate you.   Incisions: Your incision was closed with internal sutures which will dissolve.  You have a special glue applied to the incision and no special care is needed.  You may start showering (but not soaking or bathing in water) the 2nd day after surgery and the incisions simply need to be patted dry after the shower.  No additional care is needed.  Some bruising of the incision and scrotum will be normal   You may see intermittent blood from the rectum and urine after the prostate biopsy   You will be contacted by our office regarding your pathology reports and a follow-up visit  What to call us about: You should call the office if you develop fever > 101 or develop persistent vomiting, redness or draining around your incision, or any other concerning symptoms including excessive bleeding from the urine or rectum      [facility] [address], [place], [place] [address] [phone]   [place]   1) The drugs that you were given will stay in your system until tomorrow so for the next 24 hours you should  not:  A) Drive an automobile B) Make any legal decisions C) Drink any alcoholic beverage   2) You may resume regular meals tomorrow.  Today it is better to start with liquids and gradually work up to solid foods.  You may eat anything you prefer, but it is better to start with liquids, then soup and crackers, and gradually work up to solid foods.   3) Please notify your doctor immediately if you have any unusual bleeding, trouble breathing, redness and pain at the surgery site, drainage, fever, or pain not relieved by medication.    4) Additional Instructions:        Please contact your physician with any problems or Same Day Surgery at [phone], Monday through Friday 6 am to 4 pm, or [place] at [facility] number at [phone].

## 2020-01-17 NOTE — Anesthesia Procedure Notes (Signed)
Procedure Name: LMA Insertion Date/Time: 01/17/2020 8:22 AM Performed by: Eben Burow, CRNA Pre-anesthesia Checklist: Patient identified, Emergency Drugs available, Suction available, Patient being monitored and Timeout performed Patient Re-evaluated:Patient Re-evaluated prior to induction Oxygen Delivery Method: Circle system utilized Preoxygenation: Pre-oxygenation with 100% oxygen Induction Type: IV induction Ventilation: Mask ventilation without difficulty LMA: LMA inserted LMA Size: 4.5 Number of attempts: 1 Placement Confirmation: positive ETCO2 Tube secured with: Tape Dental Injury: Teeth and Oropharynx as per pre-operative assessment

## 2020-01-17 NOTE — Progress Notes (Signed)
   01/17/20 0758  Clinical Encounter Type  Visited With Patient;Family  Visit Type Initial  Referral From Chaplain  Consult/Referral To Chaplain  While rounding SDS waiting area, chaplain briefly visited with Pt's sister, Kennyth Lose. Chaplain asked if she had any questions or concerns and she wanted Pt's case #. Chaplain went to the back and briefly spoke to the Pt and asked the nurse for case number and she told chaplain to get the number from Network engineer. Chaplain saw a number ([id number]) on the list of name and asked secretary if that was the case number and she said yes. Chaplain went back to Pt's sister and gave her the number.

## 2020-01-17 NOTE — Telephone Encounter (Signed)
-----   Message from Abbie Sons, MD sent at 01/17/2020 10:00 AM EST ----- Regarding: Postop follow-up Please schedule postop follow-up with Larene Beach or Sam in approximately 2 weeks

## 2020-01-17 NOTE — Anesthesia Postprocedure Evaluation (Signed)
Anesthesia Post Note  Patient: Daniel Weiss  Procedure(s) Performed: ORCHIECTOMY (Right ) BIOPSY TRANSRECTAL ULTRASONIC PROSTATE (TUBP) (N/A )  Patient location during evaluation: PACU Anesthesia Type: General Level of consciousness: awake and alert Pain management: pain level controlled Vital Signs Assessment: post-procedure vital signs reviewed and stable Respiratory status: spontaneous breathing, nonlabored ventilation, respiratory function stable and patient connected to nasal cannula oxygen Cardiovascular status: blood pressure returned to baseline and stable Postop Assessment: no apparent nausea or vomiting Anesthetic complications: no   No complications documented.   Last Vitals:  Vitals:   01/17/20 1025 01/17/20 1035  BP: 131/81 (!) 155/87  Pulse: 79 85  Resp: 18 16  Temp: 37.2 C 36.5 C  SpO2: 98% 100%    Last Pain:  Vitals:   01/17/20 1035  TempSrc: Temporal  PainSc: 0-No pain                 Arita Miss

## 2020-01-19 LAB — CULTURE, URINE COMPREHENSIVE

## 2020-01-20 ENCOUNTER — Other Ambulatory Visit: Payer: Self-pay | Admitting: Pathology

## 2020-01-20 LAB — SURGICAL PATHOLOGY

## 2020-01-26 ENCOUNTER — Other Ambulatory Visit: Payer: Medicaid Other

## 2020-01-26 ENCOUNTER — Telehealth: Payer: Self-pay

## 2020-01-26 ENCOUNTER — Other Ambulatory Visit: Payer: Self-pay | Admitting: Oncology

## 2020-01-26 DIAGNOSIS — C801 Malignant (primary) neoplasm, unspecified: Secondary | ICD-10-CM

## 2020-01-26 DIAGNOSIS — C61 Malignant neoplasm of prostate: Secondary | ICD-10-CM

## 2020-01-26 NOTE — Telephone Encounter (Signed)
Done.. Pt was made aware of his 02/10/20 appt for lab/MD

## 2020-01-26 NOTE — Telephone Encounter (Signed)
-----   Message from Earlie Server, MD sent at 01/26/2020 12:40 PM EST ----- Please schedule him to do a followup appt with me cbc cmp PSA thanks.

## 2020-01-26 NOTE — Telephone Encounter (Signed)
Please schedule follow up and call pt with appts:   Lab/MD (cbc,cmp,psa)

## 2020-01-27 NOTE — Progress Notes (Signed)
Tumor Board Documentation  Daniel Weiss was presented by Dr Tasia Catchings at our Tumor Board on 01/26/2020, which included representatives from medical oncology, radiation oncology, surgical oncology, surgical, radiology, pathology, navigation, internal medicine, research, genetics, pharmacy, pulmonology.  Daniel Weiss currently presents as a new patient, for [place], for new positive pathology with history of the following treatments: surgical intervention(s).  Additionally, we reviewed previous medical and familial history, history of present illness, and recent lab results along with all available histopathologic and imaging studies. The tumor board considered available treatment options and made the following recommendations: Immunotherapy (Oral) Refer to Genetics, ADT therapy  The following procedures/referrals were also placed: No orders of the defined types were placed in this encounter.   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  Tumor board is a meeting of clinicians from various specialty areas who evaluate and discuss patients for whom a multidisciplinary approach is being considered. Final determinations in the plan of care are those of the provider(s). The responsibility for follow up of recommendations given during tumor board is that of the provider.   Today's extended care, comprehensive team conference, Daniel Weiss was not present for the discussion and was not examined.   Multidisciplinary Tumor Board is a multidisciplinary case peer review process.  Decisions discussed in the Multidisciplinary Tumor Board reflect the opinions of the specialists present at the conference without having examined the patient.  Ultimately, treatment and diagnostic decisions rest with the primary provider(s) and  the patient.

## 2020-01-31 ENCOUNTER — Ambulatory Visit (INDEPENDENT_AMBULATORY_CARE_PROVIDER_SITE_OTHER): Payer: Medicaid Other | Admitting: Physician Assistant

## 2020-01-31 ENCOUNTER — Other Ambulatory Visit: Payer: Self-pay

## 2020-01-31 ENCOUNTER — Encounter: Payer: Self-pay | Admitting: Physician Assistant

## 2020-01-31 VITALS — BP 116/79 | HR 110 | Ht 72.0 in | Wt 153.8 lb

## 2020-01-31 DIAGNOSIS — C61 Malignant neoplasm of prostate: Secondary | ICD-10-CM

## 2020-01-31 DIAGNOSIS — C801 Malignant (primary) neoplasm, unspecified: Secondary | ICD-10-CM

## 2020-01-31 NOTE — Progress Notes (Signed)
01/31/2020 11:16 AM   Daniel Weiss Jul 29, 1960 [id number]  CC: Chief Complaint  Patient presents with  . Routine Post Op    HPI: Daniel Weiss is a 59 y.o. 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 [name] on 01/17/2020 who presents today for postop follow-up.  He is accompanied today by his sister.  Today, patient reports doing well since surgery.  He states his surgical incision is well-healing and he has no acute concerns today.  He reports generalized fatigue and hot flashes on ADT.  He is taking calcium and vitamin D supplements as directed.  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  Within the testicular specimen, there was found to be a scar with extensive tubular atrophy consistent with completely regressed germ cell tumor.  No residual germ cell tumor or germ cell neoplasia in situ were identified.  PMH: Past Medical History:  Diagnosis Date  . Abnormal chest CT 02/2018   a. Ectatic 3 cm Ao arch - rec f/u outpt imaging w/ CTA or MRA. Ectatic atheromatous abd Ao @ risk for aneurysm - rec f/u u/s in 5 yrs. Marked prostatic enlargement w/ scattered small sclerotic foci in the thoracic lower lumbar spine, sacrum, left 12th rib, pelvis, and right femur.  Recommend elective outpatient whole-body bone scintigraphy and PSA.  Marland Kitchen CAD (coronary artery disease)    a. 02/2018 ACS/PCI: LM nl, LAD 85p (3.5x15 Sierra DES), D1 45ost, RI 55ost, RCA nl, EF 25-35%.  . Cardiac murmur   . Complete left bundle branch block (LBBB) 2016  . Dyspnea   . HFrEF (heart failure with reduced ejection fraction) ([place])   . Hypertension   . Ischemic cardiomyopathy 02/2018   a. 02/2018 Echo: EF 35-40%  (LV gram on cath was 25-30%), mod, diff HK. mid-apicalanteroseptal, ant, and apical sev HK. RVH.-->  No notable change on echo from June 2020 and August 2020.  Marland Kitchen Myocardial infarction ([place])   . NSTEMI (non-ST elevated myocardial infarction) ([place]) 02/2018   85% pLAD - DES PCI   . Prostate cancer metastatic to multiple sites [facility]) 11/2018  . Stroke ([place])   . TIA (transient ischemic attack) 2016  . Warthin's tumor     Surgical History: Past Surgical History:  Procedure Laterality Date  . CORONARY STENT INTERVENTION N/A 03/06/2018   Procedure: CORONARY STENT INTERVENTION;  Surgeon: [name], MD;  Location: [facility];  Service: Cardiovascular: Proximal LAD 85% stenosis (and D1): DES PCI with Xience Sierra DES 3.5 mm x 15 mm - 3.9 mm  . INTRAOPERATIVE TRANSTHORACIC ECHOCARDIOGRAM  03/06/2018   (Peri-MI) mild reduced EF 35-40%.  Diffuse hypokinesis (severe HK of the mid-apical anteroseptal, anterior and apical myocardium consistent with LAD infarct).  Unable to assess diastolic function.  Paradoxical septal motion likely related to his LBBB.  RV hypertrophy noted.  Trivial pericardial effusion noted.   Marland Kitchen LEFT HEART CATH AND CORONARY ANGIOGRAPHY N/A 03/06/2018   Procedure: LEFT HEART CATH AND CORONARY ANGIOGRAPHY;  Surgeon: [name], MD;  Location: [facility];  Service: Cardiovascular: Proximal LAD 85% involving D1 45%.  Ostial RI 55%.  Severe LV dysfunction EF 25 to 30%.  1+ MR.  Only minimally elevated LVEDP.  Marland Kitchen ORCHIECTOMY Right  01/17/2020   Procedure: ORCHIECTOMY;  Surgeon: Abbie Sons, MD;  Location: [facility];  Service: Urology;  Laterality: Right;  . PROSTATE BIOPSY N/A 01/17/2020   Procedure: BIOPSY TRANSRECTAL ULTRASONIC PROSTATE (TUBP);  Surgeon: Abbie Sons, MD;  Location: [facility];  Service: Urology;  Laterality: N/A;  . TRANSTHORACIC ECHOCARDIOGRAM  10/2018   (Most recent echo, to evaluate for TIA/CVA) : Moderate reduced EF 35 to 40%.  Moderate  concentric LVH.  Mild dyskinesis of the apex and severe hypokinesis of mid apical anteroseptal and anterior wall.  Unable to assess RV pressures.  Aortic sclerosis with no stenosis.  No significant change seen    Home Medications:  Allergies as of 01/31/2020   No Known Allergies     Medication List       Accurate as of January 31, 2020 11:16 AM. If you have any questions, ask your nurse or doctor.        albuterol 108 (90 Base) MCG/ACT inhaler Commonly known as: VENTOLIN HFA Inhale 2 puffs into the lungs every 6 (six) hours as needed for wheezing or shortness of breath.   bicalutamide 50 MG tablet Commonly known as: CASODEX Take 1 tablet by mouth twice daily What changed: when to take this   clopidogrel 75 MG tablet Commonly known as: PLAVIX Take 1 tablet (75 mg total) by mouth daily.   Entresto 24-26 MG Generic drug: sacubitril-valsartan Take 1/2 tablet in the morning and 1 tablet in the evening What changed:   how much to take  how to take this  when to take this  additional instructions   furosemide 20 MG tablet Commonly known as: Lasix May take 20 mg tablet as needed for increase shortness of breath or swelling What changed:   how much to take  how to take this  when to take this  reasons to take this  additional instructions   metoprolol succinate 25 MG 24 hr tablet Commonly known as: TOPROL-XL Take 1 tablet (25 mg total) by mouth daily with supper.   nitroGLYCERIN 0.4 MG SL tablet Commonly known as: Nitrostat Place 1 tablet (0.4 mg total) under the tongue every 5 (five) minutes as needed for chest pain.   ondansetron 4 MG tablet Commonly known as: ZOFRAN Take 4 mg by mouth every 4 (four) hours as needed for nausea or vomiting.   oxyCODONE-acetaminophen 5-325 MG tablet Commonly known as: PERCOCET/ROXICET Take 1 tablet by mouth every 6 (six) hours as needed for severe pain.   rosuvastatin 40 MG tablet Commonly known as: CRESTOR Take 1  tablet by mouth once daily   tamsulosin 0.4 MG Caps capsule Commonly known as: FLOMAX Take 1 capsule (0.4 mg total) by mouth daily after supper.   venlafaxine XR 37.5 MG 24 hr capsule Commonly known as: Effexor XR Take 1 capsule (37.5 mg total) by mouth daily with breakfast.       Allergies:  No Known Allergies  Family History: Family History  Problem Relation Age of Onset  . Stroke Mother   . Hypertension Mother   . Diabetes type II Mother   . Leukemia Other   . Breast cancer Other   . Stroke Brother   . CAD Neg Hx     Social History:   reports that he has been smoking cigarettes. He has been smoking about 0.00 packs per day for the past 0.00 years. He has never used smokeless tobacco. He reports current alcohol use of about 2.0 standard drinks of alcohol per  week. He reports current drug use. Frequency: 3.00 times per week. Drug: Marijuana.  Physical Exam: BP 116/79 (BP Location: Left Arm, Patient Position: Sitting, Cuff Size: Normal)   Pulse (!) 110   Ht 6' (1.829 m)   Wt 153 lb 12.8 oz (69.8 kg)   BMI 20.86 kg/m   Constitutional:  Alert and oriented, no acute distress, nontoxic appearing HEENT: , AT Cardiovascular: No clubbing, cyanosis, or edema Respiratory: Normal respiratory effort, no increased work of breathing GU: Surgical site intact and well-healed, barely visible with no overlying erythema. Skin: No rashes, bruises or suspicious lesions Neurologic: Grossly intact, no focal deficits, moving all 4 extremities Psychiatric: Normal mood and affect  Laboratory Data: Results for orders placed or performed during the hospital encounter of 01/17/20  Surgical pathology  Result Value Ref Range   SURGICAL PATHOLOGY      SURGICAL PATHOLOGY CASE: [id number] PATIENT: Daniel Weiss Surgical Pathology Report     Specimen Submitted: A. Prostate, left base B. Prostate, left mid C. Prostate, left apex D. Prostate, right base E. Prostate, right mid F.  Prostate, right apex G. Prostate, left lateral base H. Prostate, left lateral mid I. Prostate, left lateral apex J. Prostate, right lateral base K. Prostate, right lateral mid L. Prostate, right lateral apex M. Right testicle and spermatic cord  Clinical History: Right testis mass, elevated prostate specific antigen      DIAGNOSIS: A.  PROSTATE, LEFT BASE; CORE BIOPSY: - ACINAR ADENOCARCINOMA OF THE PROSTATE. - GLEASON 3+3 = 6 (GRADE GROUP 1). - TUMOR INVOLVES 1 OF 1 CORE, MEASURING 3 MM OF 15 MM CORE LENGTH (20%).  B.  PROSTATE, LEFT MID; CORE BIOPSY: - SUSPICIOUS FOR ACINAR ADENOCARCINOMA OF PROSTATE.  C.  PROSTATE, LEFT APEX; CORE BIOPSY: - ACINAR ADENOCARCINOMA OF THE PROSTATE. - GLEASON 4+3 = 7 (GRADE GROUP 3). - TU MOR INVOLVES 1 OF 1 CORE, TWO DISCONTINUOUS FOCI OF CARCINOMA. - DISCONTINUOUS FOCI MEASURE 12 MM IN AGGREGATE, INVOLVING 75% OF THE CORE LENGTH, AND SPANNING 100% OF THE LENGTH OF THE CORE.  D.  PROSTATE, RIGHT BASE; CORE BIOPSY: - ACINAR ADENOCARCINOMA OF THE PROSTATE. - GLEASON 4+3 = 7 (GRADE GROUP 3). - TUMOR INVOLVES 1 OF 1 CORE, MEASURING 6 MM OF 8 MM CORE LENGTH (75%).  E.  PROSTATE, RIGHT MID; CORE BIOPSY: - ACINAR ADENOCARCINOMA OF THE PROSTATE. - GLEASON 4+3 = 7 (GRADE GROUP 3). - TUMOR INVOLVES 1 OF 1 CORE, MEASURING 4 MM OF 7 MM CORE LENGTH (57%).  F.  PROSTATE, RIGHT APEX; CORE BIOPSY: - ACINAR ADENOCARCINOMA OF THE PROSTATE. - GLEASON 4+4 = 8 (GRADE GROUP 4). - TUMOR INVOLVES 1 OF 1 CORE, MEASURING 2 MM OF 2 MM CORE LENGTH (100%).  G.  PROSTATE, LEFT LATERAL BASE; CORE BIOPSY: - ACINAR ADENOCARCINOMA OF THE PROSTATE. - GLEASON 4+3 = 7 (GRADE GROUP 3). - TUMOR INVOLVES 1 OF 1 CORE, MEASURING 9 MM OF 11 MM CORE LENGTH (82%).  H.  PROSTATE, LEFT LATER AL MID; CORE BIOPSY: - ACINAR ADENOCARCINOMA OF THE PROSTATE. - GLEASON 4+3 = 7 (GRADE GROUP 3). - TUMOR INVOLVES 2 OF 2 CORES, MEASURING 6 MM IN AGGREGATE OF TOTAL 11 MM CORE LENGTH  (55%).  I.  PROSTATE, LEFT LATERAL APEX; CORE BIOPSY: - ACINAR ADENOCARCINOMA OF THE PROSTATE. - GLEASON 4+3 = 7 (GRADE GROUP 3). - TUMOR INVOLVES 1 OF 1 CORE, MEASURING 10 MM OF 11 MM CORE LENGTH (91%).  J.  PROSTATE, RIGHT LATERAL BASE; CORE BIOPSY: - ACINAR ADENOCARCINOMA  OF THE PROSTATE. - GLEASON 4+3 = 7 (GRADE GROUP 3). - TUMOR INVOLVES 1 OF 1 CORE, MEASURING 8 MM OF 8 MM CORE LENGTH (100%).  K.  PROSTATE, RIGHT LATERAL MID; CORE BIOPSY: - SKIN AND COLONIC MUCOSA, NEGATIVE FOR MALIGNANCY. - PROSTATE TISSUE NOT PRESENT FOR EVALUATION.  L.  PROSTATE, RIGHT LATERAL APEX; CORE BIOPSY: - SUSPICIOUS FOR ACINAR ADENOCARCINOMA OF PROSTATE.  M.  TESTICLE AND SPERMATIC CORD, RIGHT; ORCHIECTOMY: - TESTICULAR SCAR AND EXTENSIVE TUBULAR ATROPHY. - RESIDUAL GERM CELL TUMOR AND GERM CELL N EOPLASIA IN SITU (GCNIS) ARE NOT IDENTIFIED. - SEE COMMENT.  Comment: Given this patient's history of metastatic germ cell tumor the entire testis was submitted for microscopic examination. While residual germ cell tumor and / or germ cell neoplasia in situ (GCNIS) were not identified the presence of scar with extensive tubular atrophy is compatible with a completely regressed germ cell tumor.  PIN3, PSA, and PSAP stains were performed on part C and support the diagnosis of primary prostatic adenocarcinoma.  IHC slides were prepared by [facility], [place], [place]. All controls stained appropriately.  This test was developed and its performance characteristics determined by [facility]. It has not been cleared or approved by the [place] Food and Drug Administration. The FDA does not require this test to go through premarket FDA review. This test is used for clinical purposes. It should not be regarded as investigational or for research. This l aboratory is certified under the Clinical Laboratory Improvement Amendments (CLIA) as qualified to perform high complexity clinical  laboratory testing.   GROSS DESCRIPTION: A. Labeled: Left base Received: Formalin Number of needle core biopsy(s): 1 Length: 1.8 cm Diameter: 0.1 cm Description: Needle core biopsy fragment of tan soft tissue Ink: Blue Entirely submitted in 1 cassette.  B. Labeled: Left mid Received: Formalin Number of needle core biopsy(s): 2 Length: Range from 0.2-1.7 cm Diameter: 0.1 cm Description: Needle core biopsy fragment of tan soft tissue Ink: Blue Entirely submitted in 1 cassette.  C. Labeled: Left apex Received: Formalin Number of needle core biopsy(s): 1 Length: 2.1 cm Diameter: 0.1 cm Description: Needle core biopsy fragment of tan soft tissue Ink: Blue Entirely submitted in 1 cassette.  D. Labeled: Right base Received: Formalin Number of needle core biopsy(s): 1 Length: 1.4 cm Diameter: 0.1 cm Description: Needle core bi opsy fragment of tan soft tissue Ink: Blue Entirely submitted in 1 cassette.  E. Labeled: Right mid Received: Formalin Number of needle core biopsy(s): 2 Length: Range from 0.2-1.1 cm Diameter: 0.1 cm Description: Needle core biopsy fragment of tan soft tissue Ink: Blue Entirely submitted in 1 cassette.  F. Labeled: Right apex Received: Formalin Number of needle core biopsy(s): 3 Length: Range from 0.1-0.2 cm Diameter: 0.1 cm Description: Needle core biopsy fragment of tan soft tissue Ink: Blue Entirely submitted in 1 cassette.  G. Labeled: Left lateral base Received: Formalin Number of needle core biopsy(s): 2 Length: Range from 0.2-1.4 cm Diameter: 0.1 cm Description: Needle core biopsy fragment of tan soft tissue Ink: Blue Entirely submitted in 1 cassette.  H. Labeled: Left lateral mid Received: Formalin Number of needle core biopsy(s): 2 Length: Range from 0.4-0.9 cm Diameter: 0.1 cm Description: Needle core biopsy fragment of tan soft tissue In k: Blue Entirely submitted in 1 cassette.  I. Labeled: Left lateral  apex Received: Formalin Number of needle core biopsy(s): 1 Length: 1.8 cm Diameter: 0.1 cm Description: Needle core biopsy fragment of tan soft tissue Ink: Blue  Entirely submitted in 1 cassette.  J. Labeled: Right lateral base Received: Formalin Number of needle core biopsy(s): 1 Length: 1.2 cm Diameter: 0.1 cm Description: Needle core biopsy fragment of tan soft tissue Ink: Blue Entirely submitted in 1 cassette.  K. Labeled: Right lateral mid Received: Formalin Number of needle core biopsy(s): 1 Length: 0.4 cm Diameter: 0.1 cm Description: Needle core biopsy fragment of tan soft tissue Ink: Blue Entirely submitted in 1 cassette.  L. Labeled: Right lateral apex Received: Formalin Number of needle core biopsy(s): 1 Length: 0.9 cm Diameter: 0.1 cm Description: Needle core biopsy fragment of tan soft tissue Ink: Blue Entirely submitted in 1 cassette.  M. Labeled: Right testes and spe rmatic cord Received: Formalin Type of procedure: Orchiectomy Laterality: Right Weight of specimen: 30.18 grams Size of specimen:           Testis: 4.8 x 2.7 x 1.9 cm           Spermatic cord: 8.2 x 1.8 x 1.2 cm           Epididymis: 3.5 x 1.2 x 0.9 cm Structures attached to testis: No additional structures are attached. Orientation: The specimen is received unoriented. Number of masses: 1 Size(s) of mass(es): 1.5 x 1.4 x 1.2 cm Description of mass(es): The mass is tan-white, firm, and well-circumscribed with areas of tan and yellow discoloration. Confinement to testis: The mass is grossly confined to the testicle.  Relationship of tumor to adjacent anatomic structures:                      Epididymis: The mass is 0.1 cm from the epididymis; however, the tunica albuginea between the testicle and epididymis is not adherent and is grossly unremarkable.                      Tunica vaginalis: Less than 0.1 cm, the mass is at least abutting the tunica vaginalis.                        Paratesticular soft tissues: None are grossly appreciated.                      Spermatic cord: The mass is 1.6 cm to the spermatic cord.                      Hilar soft tissue: The mass is 0.1 cm to the closest aspect of the hilar soft tissue.                      Other: No additional structures are grossly appreciated. Margins: 9.7 cm from the spermatic cord resection margin and 0.1 cm to the roughened tunica vaginalis Description of remainder of tissue: The tunica vaginalis is tan-pink and smooth with an ill-defined 4.2 x 2.5 cm area of disruption.  The tunica vaginalis is inked black and the area of disruption is inked green.  The remainder of the testicle has tan and soft parenchyma.  The seminiferous tubules do not string with ease.  The epididymis is tan, tubular, and grossly unremarkable.  The tunica albuginea is tan-pink, smooth, and otherwise grossly unremarkable.  The spermatic cord at the resection margin is clamped and may be distorted.   Block summary: 1 - spermatic cord resection margin, en face 2 - spermatic cord immediately adjacent to resection margin 3 - spermatic cord just above epididymis  4 - representative mid spermatic cord cross-sections 5 - 8 - mass, submitted entirely with adjacent tunica vaginalis      7 - closest approach to tunica albuginea, epididymis, and hilar soft tissue      8 - additional closest approach to epididymis and hilar soft tissue 9 - additional adjacent hilar soft tissue and epididymis 10 - additional hilar soft tissue 11 - representative grossly normal testicular parenchyma 12 - representative epididymal head 13 - 26 - remainder of testicle with adjacent hilar soft tissue, epididymis, and tunica vaginalis      13 - 17 - testicle adjacent to mass (bivalved half), submitted sequentially from distal to spermatic cord      18 - 26 - testicle opposing mass (bivalved half), submitted sequentially from distal to spermatic cord  Final  Diagnosis performed by Quay Burow, MD.    Electronically signed 01/20/2020 9:04:14AM The electronic signature indicates that the named Attending Pathologist has evaluated the specimen Technical component performed at [facility], [address] [place], [place] [address] Lab: [phone] Dir: Rush Farmer, MD, MMM  Professional component performed at [facility], [facility], [place], [place], [place] [address] Lab: [phone] Dir: Dellia Nims. Reuel Derby, MD    Assessment & Plan:   1. Prostate cancer ([place]) Gleason 4+4 acinar adenocarcinoma confirmed on biopsy.  Continue ADT as scheduled.  Dr. [name] to coordinate care with the cancer center.  2. 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  Surgical incision well healing.  Return for Dr. [name] to call to discuss pathology further.  Debroah Loop, PA-C  [facility] [address], [place], [place] [address] [phone]

## 2020-02-10 ENCOUNTER — Other Ambulatory Visit: Payer: Self-pay | Admitting: Oncology

## 2020-02-10 ENCOUNTER — Inpatient Hospital Stay (HOSPITAL_BASED_OUTPATIENT_CLINIC_OR_DEPARTMENT_OTHER): Payer: Medicaid Other | Admitting: Oncology

## 2020-02-10 ENCOUNTER — Telehealth: Payer: Self-pay | Admitting: Pharmacist

## 2020-02-10 ENCOUNTER — Telehealth: Payer: Self-pay | Admitting: Pharmacy Technician

## 2020-02-10 ENCOUNTER — Encounter: Payer: Self-pay | Admitting: Oncology

## 2020-02-10 ENCOUNTER — Inpatient Hospital Stay: Payer: Medicaid Other | Attending: Oncology

## 2020-02-10 VITALS — BP 109/76 | HR 93 | Temp 97.8°F | Wt 151.5 lb

## 2020-02-10 DIAGNOSIS — Z79890 Hormone replacement therapy (postmenopausal): Secondary | ICD-10-CM | POA: Insufficient documentation

## 2020-02-10 DIAGNOSIS — Z7189 Other specified counseling: Secondary | ICD-10-CM

## 2020-02-10 DIAGNOSIS — I447 Left bundle-branch block, unspecified: Secondary | ICD-10-CM | POA: Insufficient documentation

## 2020-02-10 DIAGNOSIS — R591 Generalized enlarged lymph nodes: Secondary | ICD-10-CM | POA: Insufficient documentation

## 2020-02-10 DIAGNOSIS — F17210 Nicotine dependence, cigarettes, uncomplicated: Secondary | ICD-10-CM | POA: Diagnosis not present

## 2020-02-10 DIAGNOSIS — I255 Ischemic cardiomyopathy: Secondary | ICD-10-CM | POA: Diagnosis not present

## 2020-02-10 DIAGNOSIS — R42 Dizziness and giddiness: Secondary | ICD-10-CM | POA: Diagnosis not present

## 2020-02-10 DIAGNOSIS — Z833 Family history of diabetes mellitus: Secondary | ICD-10-CM | POA: Diagnosis not present

## 2020-02-10 DIAGNOSIS — R933 Abnormal findings on diagnostic imaging of other parts of digestive tract: Secondary | ICD-10-CM | POA: Insufficient documentation

## 2020-02-10 DIAGNOSIS — C969 Malignant neoplasm of lymphoid, hematopoietic and related tissue, unspecified: Secondary | ICD-10-CM | POA: Diagnosis not present

## 2020-02-10 DIAGNOSIS — I252 Old myocardial infarction: Secondary | ICD-10-CM | POA: Insufficient documentation

## 2020-02-10 DIAGNOSIS — R911 Solitary pulmonary nodule: Secondary | ICD-10-CM | POA: Diagnosis not present

## 2020-02-10 DIAGNOSIS — C7951 Secondary malignant neoplasm of bone: Secondary | ICD-10-CM | POA: Diagnosis not present

## 2020-02-10 DIAGNOSIS — I2510 Atherosclerotic heart disease of native coronary artery without angina pectoris: Secondary | ICD-10-CM | POA: Insufficient documentation

## 2020-02-10 DIAGNOSIS — Z79899 Other long term (current) drug therapy: Secondary | ICD-10-CM | POA: Insufficient documentation

## 2020-02-10 DIAGNOSIS — Z803 Family history of malignant neoplasm of breast: Secondary | ICD-10-CM | POA: Diagnosis not present

## 2020-02-10 DIAGNOSIS — C801 Malignant (primary) neoplasm, unspecified: Secondary | ICD-10-CM

## 2020-02-10 DIAGNOSIS — Z8249 Family history of ischemic heart disease and other diseases of the circulatory system: Secondary | ICD-10-CM | POA: Insufficient documentation

## 2020-02-10 DIAGNOSIS — Z7902 Long term (current) use of antithrombotics/antiplatelets: Secondary | ICD-10-CM | POA: Insufficient documentation

## 2020-02-10 DIAGNOSIS — R221 Localized swelling, mass and lump, neck: Secondary | ICD-10-CM | POA: Insufficient documentation

## 2020-02-10 DIAGNOSIS — G4700 Insomnia, unspecified: Secondary | ICD-10-CM | POA: Insufficient documentation

## 2020-02-10 DIAGNOSIS — C61 Malignant neoplasm of prostate: Secondary | ICD-10-CM | POA: Insufficient documentation

## 2020-02-10 DIAGNOSIS — Z7901 Long term (current) use of anticoagulants: Secondary | ICD-10-CM | POA: Diagnosis not present

## 2020-02-10 DIAGNOSIS — I110 Hypertensive heart disease with heart failure: Secondary | ICD-10-CM | POA: Diagnosis not present

## 2020-02-10 DIAGNOSIS — Z8673 Personal history of transient ischemic attack (TIA), and cerebral infarction without residual deficits: Secondary | ICD-10-CM | POA: Insufficient documentation

## 2020-02-10 DIAGNOSIS — K118 Other diseases of salivary glands: Secondary | ICD-10-CM | POA: Diagnosis not present

## 2020-02-10 DIAGNOSIS — Z806 Family history of leukemia: Secondary | ICD-10-CM | POA: Diagnosis not present

## 2020-02-10 LAB — COMPREHENSIVE METABOLIC PANEL
ALT: 39 U/L (ref 0–44)
AST: 64 U/L — ABNORMAL HIGH (ref 15–41)
Albumin: 3.7 g/dL (ref 3.5–5.0)
Alkaline Phosphatase: 53 U/L (ref 38–126)
Anion gap: 12 (ref 5–15)
BUN: 14 mg/dL (ref 6–20)
CO2: 24 mmol/L (ref 22–32)
Calcium: 9.1 mg/dL (ref 8.9–10.3)
Chloride: 96 mmol/L — ABNORMAL LOW (ref 98–111)
Creatinine, Ser: 0.92 mg/dL (ref 0.61–1.24)
GFR, Estimated: >60 mL/min (ref >60)
Glucose, Bld: 126 mg/dL — ABNORMAL HIGH (ref 70–99)
Potassium: 4.3 mmol/L (ref 3.5–5.1)
Sodium: 132 mmol/L — ABNORMAL LOW (ref 135–145)
Total Bilirubin: 0.6 mg/dL (ref 0.3–1.2)
Total Protein: 7.1 g/dL (ref 6.5–8.1)

## 2020-02-10 LAB — CBC WITH DIFFERENTIAL/PLATELET
Abs Immature Granulocytes: 0.01 10*3/uL (ref 0.00–0.07)
Basophils Absolute: 0 10*3/uL (ref 0.0–0.1)
Basophils Relative: 0 %
Eosinophils Absolute: 0 10*3/uL (ref 0.0–0.5)
Eosinophils Relative: 0 %
HCT: 35.4 % — ABNORMAL LOW (ref 39.0–52.0)
Hemoglobin: 11.9 g/dL — ABNORMAL LOW (ref 13.0–17.0)
Immature Granulocytes: 0 %
Lymphocytes Relative: 47 %
Lymphs Abs: 2.2 10*3/uL (ref 0.7–4.0)
MCH: 26.8 pg (ref 26.0–34.0)
MCHC: 33.6 g/dL (ref 30.0–36.0)
MCV: 79.7 fL — ABNORMAL LOW (ref 80.0–100.0)
Monocytes Absolute: 0.5 10*3/uL (ref 0.1–1.0)
Monocytes Relative: 11 %
Neutro Abs: 2 10*3/uL (ref 1.7–7.7)
Neutrophils Relative %: 42 %
Platelets: 273 10*3/uL (ref 150–400)
RBC: 4.44 MIL/uL (ref 4.22–5.81)
RDW: 17.8 % — ABNORMAL HIGH (ref 11.5–15.5)
WBC: 4.7 10*3/uL (ref 4.0–10.5)
nRBC: 0 % (ref 0.0–0.2)

## 2020-02-10 LAB — LACTATE DEHYDROGENASE: LDH: 132 U/L (ref 98–192)

## 2020-02-10 LAB — PSA: Prostatic Specific Antigen: 2.86 ng/mL (ref 0.00–4.00)

## 2020-02-10 MED ORDER — PREDNISONE 5 MG PO TABS: 5.0000 mg | ORAL_TABLET | Freq: Every day | ORAL | 3 refills | Status: DC

## 2020-02-10 MED ORDER — ABIRATERONE ACETATE 250 MG PO TABS: 1000.0000 mg | ORAL_TABLET | Freq: Every day | ORAL | 0 refills | Status: DC

## 2020-02-10 MED FILL — predniSONE 5 MG TABS: 5 | 30 days supply | Qty: 30 | Fill #0

## 2020-02-10 NOTE — Telephone Encounter (Signed)
Oral Oncology Pharmacist Encounter  Received new prescription for Zytiga (abiraterone) for the treatment of newly diagnosed metastatic castration sensitive prostate cancer, planned duration until disease progression or unacceptable drug toxicity  CMP from 02/10/20 assessed, no relevant lab abnormalities. Prescription dose and frequency assessed.   Current medication list in Epic reviewed, a few DDIs with abiraterone identified: -Metoprolol: Abiraterone may increase the concentration of metoprolol. Monitor patient for increased side effects from the metoprolol (hypotension, bradycardia). No baseline dose adjustment needed.  -Tamsulosin: Abiraterone may increase the concentration of tamsulosin. Monitor patient for increased side effects from the tamsulosin (hypotension, orthostasis). No baseline dose adjustment needed.   Evaluated chart and no patient barriers to medication adherence identified.   Oral Oncology Clinic will continue to follow for insurance authorization, copayment issues, initial counseling and start date.  Darl Pikes, PharmD, BCPS, BCOP, CPP Hematology/Oncology Clinical Pharmacist Practitioner [facility] [phone]  02/10/2020 12:05 PM

## 2020-02-10 NOTE — Telephone Encounter (Signed)
Oral Oncology Patient Advocate Encounter  After completing a benefits investigation, prior authorization for Zytiga (Abiraterone) is not required at this time through [facility] plan.  Patient's copay is $0.00  Daniel Patient Weiss Phone [phone] Fax [phone] 02/10/2020 12:52 PM

## 2020-02-10 NOTE — Progress Notes (Signed)
Hematology/Oncology follow up note [facility] Telephone:([phone] Fax:(336) [phone]   Patient Care Team: Azzie Glatter, FNP as PCP - General (Family Medicine) [name], MD as PCP - Cardiology (Cardiology) McKenzie, Candee Furbish, MD as Consulting Physician (Urology)  REFERRING PROVIDER: Azzie Glatter, FNP  CHIEF COMPLAINTS/REASON FOR VISIT:  Follow up for prostate cancer  HISTORY OF PRESENTING ILLNESS:   Daniel Weiss is a  59 y.o.  male with PMH listed below was seen in consultation at the request of  Azzie Glatter, FNP  for evaluation of presumed prostate cancer Patient recently switched his care to [facility] and was seen by Dr. [name]. He was previously followed up at [facility] urology I reviewed Dr. Dene Gentry note. Patient was diagnosed with presumed prostate cancer on 10/2018 with a PSA of 509, incidental findings of multiple sclerotic lesions on CT-chest abdomen pelvis during work-up for a CVA.  His previous urology care was with Dr. Alyson Ingles. 11/05/2018 bone scan showed solitary punctate focus of activity in the left first sacral segment corresponding to a 9 mm mixed lytic and sclerotic metastatic's on recent CT.  The remaining numerous sclerotic osseous metastasis identified on CT do not demonstrate abnormal activity and are therefore healed.  At that time No tissue diagnosis/biopsy was obtained. Patient was started with Lupron and Casodex Lupron was eventually to Eligard [facility], PSA was 13.13, testosterone level less than 10. Casodex was discontinued in June 2021.   10/26/2019 patient switched to [facility] urology and was seen by Dr. [name] Per urology note, PSA trend: 11/03/2018: 502 11/22/2018: 377 02/21/2019: 387 04/25/2019: 123 09/05/2019: 13.3  Patient gets androgen deprivation therapy through Dr. Dene Gentry office.  Eligard 45mg   on 11/11/2019 -Patient was  referred to heme-onc on 11/01/2019.  10/06/2019 bone scan showed resolution of punctated activity over the left sacrum.  The punctate area of increased activity over the right lower lumbar spine again noted. 10/31/2019, CT angio chest aorta showed no evidence of aortic aneurysm or dissection.  Atherosclerotic calcification spleen seen in the upper abdominal aorta.  Chronic artery disease.  No acute cardiopulmonary disease. Patient has a chronic right neck mass.  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   11/18/2019 CT abdomen pelvis with contrast showed retrocaval lymphadenopathy in the right common and external iliac chains.  Stable to minimally progressed in the interval since last CT scan 11/03/2018 Numerous sclerotic bone lesions.  Stable 3 mm subpleural right middle lobe lung nodule stable.  Aortic atherosclerosis   Patient has extensive cardiology issues.  He follows up with Dr. Ellyn Hack. Patient has history of STEMI-PCI to LAD with resultant ischemia cardiomyopathy, EF 35 to 40%, left bundle branch block with acute CVA-10/2018.  CT coronary showed nonobstructing plaquing of coronary distributions, intimal irregularity with filling defect in the ascending aorta but no LV thrombus.  And the patient is on warfarin and Plavix.  Patient was seen by thoracic surgeon Dr. Roxy Manns will recommend patient to be on Coumadin for minimum of 3 months.  With presentation with stroke, Dr. Ellyn Hack recommend at least 6 to 12 months of Coumadin. -Coumadin was discontinued by Dr. Ellyn Hack in September 2021 and switched to.  Plavix Patient lives with his sister.  He has 3 adult children.  His activity is quite limited due to shortness of breath with exertion due  to cardiology problems.  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 Tumor markers Showed normal  LDH, beta hCG, AFP # 01/17/2020 scrotum ultrasound showed a small right testicular mass measuring 1.6 cm, contained an internal calcification.  Appearance was consistent with primary testicular neoplasm.  Normal size and appearance of the left testicle.  INTERVAL HISTORY Daniel Weiss is a 59 y.o. male who has above history reviewed by me today presents for follow up visit for management of presumed prostate cancer Problems and complaints are listed below: Today he reports insomnia and also some dizziness this morning.  No dizziness currently. 01/17/2020, patient underwent prostate biopsy as well as right orchiectomy. Pathology showed DIAGNOSIS:  A. PROSTATE, LEFT BASE; CORE BIOPSY:  - ACINAR ADENOCARCINOMA OF THE PROSTATE.  - GLEASON 3+3 = 6 (GRADE GROUP 1).  - TUMOR INVOLVES 1 OF 1 CORE, MEASURING 3 MM OF 15 MM CORE LENGTH (20%).   B. PROSTATE, LEFT MID; CORE BIOPSY:  - SUSPICIOUS FOR ACINAR ADENOCARCINOMA OF PROSTATE.   C. PROSTATE, LEFT APEX; CORE BIOPSY:  - ACINAR ADENOCARCINOMA OF THE PROSTATE.  - GLEASON 4+3 = 7 (GRADE GROUP 3).  - TUMOR INVOLVES 1 OF 1 CORE, TWO DISCONTINUOUS FOCI OF CARCINOMA.  - DISCONTINUOUS FOCI MEASURE 12 MM IN AGGREGATE, INVOLVING 75% OF THE  CORE LENGTH, AND SPANNING 100% OF THE LENGTH OF THE CORE.   D. PROSTATE, RIGHT BASE; CORE BIOPSY:  - ACINAR ADENOCARCINOMA OF THE PROSTATE.  - GLEASON 4+3 = 7 (GRADE GROUP 3).  - TUMOR INVOLVES 1 OF 1 CORE, MEASURING 6 MM OF 8 MM CORE LENGTH (75%).   E. PROSTATE, RIGHT MID; CORE BIOPSY:  - ACINAR ADENOCARCINOMA OF THE PROSTATE.  - GLEASON 4+3 = 7 (GRADE GROUP 3).  - TUMOR INVOLVES 1 OF 1 CORE, MEASURING 4 MM OF 7 MM CORE LENGTH (57%).   F. PROSTATE, RIGHT APEX; CORE BIOPSY:  - ACINAR ADENOCARCINOMA OF THE PROSTATE.  - GLEASON 4+4 = 8 (GRADE GROUP 4).  - TUMOR INVOLVES 1 OF 1 CORE, MEASURING 2 MM OF 2 MM CORE LENGTH (100%).   G. PROSTATE, LEFT LATERAL BASE; CORE BIOPSY:  - ACINAR ADENOCARCINOMA OF THE  PROSTATE.  - GLEASON 4+3 = 7 (GRADE GROUP 3).  - TUMOR INVOLVES 1 OF 1 CORE, MEASURING 9 MM OF 11 MM CORE LENGTH (82%).   H. PROSTATE, LEFT LATERAL MID; CORE BIOPSY:  - ACINAR ADENOCARCINOMA OF THE PROSTATE.  - GLEASON 4+3 = 7 (GRADE GROUP 3).  - TUMOR INVOLVES 2 OF 2 CORES, MEASURING 6 MM IN AGGREGATE OF TOTAL 11  MM CORE LENGTH (55%).   I. PROSTATE, LEFT LATERAL APEX; CORE BIOPSY:  - ACINAR ADENOCARCINOMA OF THE PROSTATE.  - GLEASON 4+3 = 7 (GRADE GROUP 3).  - TUMOR INVOLVES 1 OF 1 CORE, MEASURING 10 MM OF 11 MM CORE LENGTH  (91%).   J. PROSTATE, RIGHT LATERAL BASE; CORE BIOPSY:  - ACINAR ADENOCARCINOMA OF THE PROSTATE.  - GLEASON 4+3 = 7 (GRADE GROUP 3).  - TUMOR INVOLVES 1 OF 1 CORE, MEASURING 8 MM OF 8 MM CORE LENGTH (100%).   K. PROSTATE, RIGHT LATERAL MID; CORE BIOPSY:  - SKIN AND COLONIC MUCOSA, NEGATIVE FOR MALIGNANCY.  - PROSTATE TISSUE NOT PRESENT FOR EVALUATION.   L. PROSTATE, RIGHT LATERAL APEX; CORE BIOPSY:  - SUSPICIOUS FOR ACINAR ADENOCARCINOMA OF PROSTATE.   M. TESTICLE AND SPERMATIC CORD, RIGHT; ORCHIECTOMY:  - TESTICULAR SCAR AND EXTENSIVE TUBULAR  ATROPHY.  - RESIDUAL GERM CELL TUMOR AND GERM CELL NEOPLASIA IN SITU (GCNIS) ARE  NOT IDENTIFIED.  - SEE COMMENT.   Review of Systems  Constitutional: Negative for appetite change, chills, fatigue, fever and unexpected weight change.  HENT:   Negative for hearing loss and voice change.   Eyes: Negative for eye problems and icterus.  Respiratory: Positive for shortness of breath. Negative for chest tightness and cough.   Cardiovascular: Negative for chest pain and leg swelling.  Gastrointestinal: Negative for abdominal distention and abdominal pain.  Endocrine: Negative for hot flashes.  Genitourinary: Negative for difficulty urinating, dysuria and frequency.   Musculoskeletal: Negative for arthralgias.       Left hip pain  Skin: Negative for itching and rash.  Neurological: Positive for dizziness.  Negative for light-headedness and numbness.  Hematological: Negative for adenopathy. Does not bruise/bleed easily.  Psychiatric/Behavioral: Positive for sleep disturbance. Negative for confusion.    MEDICAL HISTORY:  Past Medical History:  Diagnosis Date  . Abnormal chest CT 02/2018   a. Ectatic 3 cm Ao arch - rec f/u outpt imaging w/ CTA or MRA. Ectatic atheromatous abd Ao @ risk for aneurysm - rec f/u u/s in 5 yrs. Marked prostatic enlargement w/ scattered small sclerotic foci in the thoracic lower lumbar spine, sacrum, left 12th rib, pelvis, and right femur.  Recommend elective outpatient whole-body bone scintigraphy and PSA.  Marland Kitchen CAD (coronary artery disease)    a. 02/2018 ACS/PCI: LM nl, LAD 85p (3.5x15 Sierra DES), D1 45ost, RI 55ost, RCA nl, EF 25-35%.  . Cardiac murmur   . Complete left bundle branch block (LBBB) 2016  . Dyspnea   . HFrEF (heart failure with reduced ejection fraction) ([place])   . Hypertension   . Ischemic cardiomyopathy 02/2018   a. 02/2018 Echo: EF 35-40% (LV gram on cath was 25-30%), mod, diff HK. mid-apicalanteroseptal, ant, and apical sev HK. RVH.-->  No notable change on echo from June 2020 and August 2020.  Marland Kitchen Myocardial infarction ([place])   . NSTEMI (non-ST elevated myocardial infarction) ([place]) 02/2018   85% pLAD - DES PCI   . Prostate cancer metastatic to multiple sites [facility]) 11/2018  . Stroke ([place])   . TIA (transient ischemic attack) 2016  . Warthin's tumor     SURGICAL HISTORY: Past Surgical History:  Procedure Laterality Date  . CORONARY STENT INTERVENTION N/A 03/06/2018   Procedure: CORONARY STENT INTERVENTION;  Surgeon: [name], MD;  Location: [place] [facility];  Service: Cardiovascular: Proximal LAD 85% stenosis (and D1): DES PCI with Xience Sierra DES 3.5 mm x 15 mm - 3.9 mm  . INTRAOPERATIVE TRANSTHORACIC ECHOCARDIOGRAM  03/06/2018   (Peri-MI) mild reduced EF 35-40%.  Diffuse hypokinesis (severe HK of the mid-apical anteroseptal, anterior  and apical myocardium consistent with LAD infarct).  Unable to assess diastolic function.  Paradoxical septal motion likely related to his LBBB.  RV hypertrophy noted.  Trivial pericardial effusion noted.   Marland Kitchen LEFT HEART CATH AND CORONARY ANGIOGRAPHY N/A 03/06/2018   Procedure: LEFT HEART CATH AND CORONARY ANGIOGRAPHY;  Surgeon: [name], MD;  Location: [place] [facility];  Service: Cardiovascular: Proximal LAD 85% involving D1 45%.  Ostial RI 55%.  Severe LV dysfunction EF 25 to 30%.  1+ MR.  Only minimally elevated LVEDP.  Marland Kitchen ORCHIECTOMY Right 01/17/2020   Procedure: ORCHIECTOMY;  Surgeon: Abbie Sons, MD;  Location: [facility];  Service: Urology;  Laterality: Right;  . PROSTATE BIOPSY N/A 01/17/2020   Procedure:  BIOPSY TRANSRECTAL ULTRASONIC PROSTATE (TUBP);  Surgeon: Abbie Sons, MD;  Location: [facility];  Service: Urology;  Laterality: N/A;  . TRANSTHORACIC ECHOCARDIOGRAM  10/2018   (Most recent echo, to evaluate for TIA/CVA) : Moderate reduced EF 35 to 40%.  Moderate concentric LVH.  Mild dyskinesis of the apex and severe hypokinesis of mid apical anteroseptal and anterior wall.  Unable to assess RV pressures.  Aortic sclerosis with no stenosis.  No significant change seen    SOCIAL HISTORY: Social History   Socioeconomic History  . Marital status: Legally Separated    Spouse name: Dorothenia  . Number of children: Not on file  . Years of education: Not on file  . Highest education level: Not on file  Occupational History  . Occupation: cook  Tobacco Use  . Smoking status: Current Some Day Smoker    Packs/day: 0.00    Years: 0.00    Pack years: 0.00    Types: Cigarettes  . Smokeless tobacco: Never Used  . Tobacco comment: quit three days ago  Vaping Use  . Vaping Use: Never used  Substance and Sexual Activity  . Alcohol use: Yes    Alcohol/week: 2.0 standard drinks    Types: 2 Cans of beer per week    Comment: 2 Beers daily.  . Drug use: Yes    Frequency: 3.0  times per week    Types: Marijuana  . Sexual activity: Not Currently  Other Topics Concern  . Not on file  Social History Narrative  . Not on file   Social Determinants of Health   Financial Resource Strain:   . Difficulty of Paying Living Expenses: Not on file  Food Insecurity:   . Worried About Charity fundraiser in the Last Year: Not on file  . Ran Out of Food in the Last Year: Not on file  Transportation Needs:   . Lack of Transportation (Medical): Not on file  . Lack of Transportation (Non-Medical): Not on file  Physical Activity:   . Days of Exercise per Week: Not on file  . Minutes of Exercise per Session: Not on file  Stress:   . Feeling of Stress : Not on file  Social Connections:   . Frequency of Communication with Friends and Family: Not on file  . Frequency of Social Gatherings with Friends and Family: Not on file  . Attends Religious Services: Not on file  . Active Member of Clubs or Organizations: Not on file  . Attends Archivist Meetings: Not on file  . Marital Status: Not on file  Intimate Partner Violence:   . Fear of Current or Ex-Partner: Not on file  . Emotionally Abused: Not on file  . Physically Abused: Not on file  . Sexually Abused: Not on file    FAMILY HISTORY: Family History  Problem Relation Age of Onset  . Stroke Mother   . Hypertension Mother   . Diabetes type II Mother   . Leukemia Other   . Breast cancer Other   . Stroke Brother   . CAD Neg Hx     ALLERGIES:  has No Known Allergies.  MEDICATIONS:  Current Outpatient Medications  Medication Sig Dispense Refill  . albuterol (VENTOLIN HFA) 108 (90 Base) MCG/ACT inhaler Inhale 2 puffs into the lungs every 6 (six) hours as needed for wheezing or shortness of breath. 8 g 11  . bicalutamide (CASODEX) 50 MG tablet Take 1 tablet by mouth twice daily (Patient taking differently: Take  50 mg by mouth in the morning and at bedtime. ) 60 tablet 0  . clopidogrel (PLAVIX) 75 MG  tablet Take 1 tablet (75 mg total) by mouth daily. 90 tablet 3  . furosemide (LASIX) 20 MG tablet May take 20 mg tablet as needed for increase shortness of breath or swelling (Patient taking differently: Take 20 mg by mouth daily as needed for edema (shortness of breath). ) 20 tablet 6  . metoprolol succinate (TOPROL-XL) 25 MG 24 hr tablet Take 1 tablet (25 mg total) by mouth daily with supper. 45 tablet 1  . ondansetron (ZOFRAN) 4 MG tablet Take 4 mg by mouth every 4 (four) hours as needed for nausea or vomiting.     . rosuvastatin (CRESTOR) 40 MG tablet Take 1 tablet by mouth once daily (Patient taking differently: Take 40 mg by mouth daily. ) 30 tablet 1  . sacubitril-valsartan (ENTRESTO) 24-26 MG Take 1/2 tablet in the morning and 1 tablet in the evening (Patient taking differently: Take 0.5 tablets by mouth 2 (two) times daily. ) 60 tablet 8  . tamsulosin (FLOMAX) 0.4 MG CAPS capsule Take 1 capsule (0.4 mg total) by mouth daily after supper. 90 capsule 3  . venlafaxine XR (EFFEXOR XR) 37.5 MG 24 hr capsule Take 1 capsule (37.5 mg total) by mouth daily with breakfast. 30 capsule 1  . abiraterone acetate (ZYTIGA) 250 MG tablet Take 4 tablets (1,000 mg total) by mouth daily. Take on an empty stomach 1 hour before or 2 hours after a meal 120 tablet 0  . nitroGLYCERIN (NITROSTAT) 0.4 MG SL tablet Place 1 tablet (0.4 mg total) under the tongue every 5 (five) minutes as needed for chest pain. (Patient not taking: Reported on 02/10/2020) 25 tablet 3  . oxyCODONE-acetaminophen (PERCOCET/ROXICET) 5-325 MG tablet Take 1 tablet by mouth every 6 (six) hours as needed for severe pain. (Patient not taking: Reported on 02/10/2020) 15 tablet 0  . predniSONE (DELTASONE) 5 MG tablet Take 1 tablet (5 mg total) by mouth daily with breakfast. 30 tablet 3   No current facility-administered medications for this visit.     PHYSICAL EXAMINATION: ECOG PERFORMANCE STATUS: 1 - Symptomatic but completely ambulatory Vitals:    02/10/20 1022  BP: 109/76  Pulse: 93  Temp: 97.8 F (36.6 C)  SpO2: 99%   Filed Weights   02/10/20 1022  Weight: 151 lb 8 oz (68.7 kg)    Physical Exam Constitutional:      General: He is not in acute distress.    Comments: Thin built.   HENT:     Head: Normocephalic and atraumatic.  Eyes:     General: No scleral icterus. Neck:     Comments: Palpable right cervical mass Cardiovascular:     Rate and Rhythm: Normal rate and regular rhythm.     Heart sounds: Murmur heard.   Pulmonary:     Effort: Pulmonary effort is normal. No respiratory distress.     Breath sounds: No wheezing.  Abdominal:     General: Bowel sounds are normal. There is no distension.     Palpations: Abdomen is soft.  Musculoskeletal:        General: No deformity. Normal range of motion.     Cervical back: Normal range of motion and neck supple.  Skin:    General: Skin is warm and dry.     Findings: No erythema or rash.  Neurological:     Mental Status: He is alert and oriented to person, place,  and time. Mental status is at baseline.     Cranial Nerves: No cranial nerve deficit.     Coordination: Coordination normal.  Psychiatric:        Mood and Affect: Mood normal.     LABORATORY DATA:  I have reviewed the data as listed Lab Results  Component Value Date   WBC 4.7 02/10/2020   HGB 11.9 (L) 02/10/2020   HCT 35.4 (L) 02/10/2020   MCV 79.7 (L) 02/10/2020   PLT 273 02/10/2020   Recent Labs     0000 06/07/19 1253 07/26/19 1450 07/26/19 1450 11/01/19 1021 12/15/19 0920 02/10/20 1000  NA  --   --  134*   < > 138 138 132*  K  --   --  4.5   < > 4.3 3.8 4.3  CL  --   --  99   < > 102 100 96*  CO2  --   --  23   < > 25 27 24   GLUCOSE  --   --  111*   < > 107* 104* 126*  BUN  --   --  14   < > 11 8 14   CREATININE  --   --  0.74   < > 0.78 0.70 0.92  CALCIUM  --   --  9.5   < > 9.2 9.5 9.1  GFRNONAA  --   --  >60   < > >60 >60 >60  GFRAA  --   --  >60  --  >60  --   --   PROT  --   7.0  --   --  7.6 7.6 7.1  ALBUMIN  --  4.2  --   --  3.8 3.7 3.7  AST   < > 91*  --   --  44* 67* 64*  ALT   < > 69*  --   --  31 54* 39  ALKPHOS   < > 103  --   --  78 90 53  BILITOT  --  0.2  --   --  0.5 0.6 0.6  BILIDIR  --  0.10  --   --   --   --   --    < > = values in this interval not displayed.   Iron/TIBC/Ferritin/ %Sat No results found for: IRON, TIBC, FERRITIN, IRONPCTSAT    RADIOGRAPHIC STUDIES: I have personally reviewed the radiological images as listed and agreed with the findings in the report. [place] Transrectal Complete  Result Date: 01/17/2020 Please see Notes tab for imaging impression.  [place] PROSTATE BIOPSY MULTIPLE  Result Date: 01/17/2020 Please see Notes tab for imaging impression.     ASSESSMENT & PLAN:  1. Prostate cancer ([place])   2. Germ cell tumor ([place])   3. Goals of care, counseling/discussion   4. 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 Continue androgen deprivation therapy.  He is PSA has responded well, today's PSA is 2.86. Send NGS Discussed with patient, and also called daughter Liberty Handy over the phone, about patient's diagnosis, extent of the disease.  He understands that his condition is not curable but treatable. Discussed with treatment options including ADT with upfront docetaxel versus Abiateron/prednisone.  Patient is not interested in chemotherapy at this point due to his multiple other medical conditions.   Will check insurance coverage. Rationale  of Abiateron/prednisone and potential side effects were discussed with patient and her daughter.  He agrees with the treatment plan.   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  #01/17/2020 right orchiectomy residual germ cell tumor and germ cell neoplasia in situ are not identified.  Case was discussed on tumor board.  Consensus reached a point although residual germ cell tumor/infection was not identified, the presence of scar with extensive tubular atrophy is compatible with a completely regressed germ cell tumor. Discussed with patient about treatment options, chemotherapy BEP vs RT were discussed. He has multiple medical problems, TIA, Stroke, NSTEMI, HTN, CHF, LBBB,  tobacco abuse, PS 1-2,  Patient is interested in RT. Will refer to Radonc.  I recommend patient to get a second opinion given the complexity of his case. He agrees. [facility] refer to [facility].   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  Recommend him to ENT for evaluation.    #Sclerotic bone metastasis, elevation of PSA, presumed metastatic prostate cancer with bone metastasis,  currently on Eligard, managed by Dr. Dene Gentry office.  Last Eligard was given in September 2021.  Urology for discussion of prostate biopsy. Hold off additional therapy until prostate cancer is pathologically confirmed.    All questions were answered. The patient knows to call the clinic with any problems questions or concerns.  cc Azzie Glatter, FNP   Return of visit: 1-2 weeks for evaluation of tolerability of Abiaterone/prednisone.   Earlie Server, MD, PhD Hematology Oncology [facility] Pager- [phone] 02/10/2020

## 2020-02-10 NOTE — Progress Notes (Signed)
Pt here for follow up. Complains of not being able to sleep and dizziness.

## 2020-02-10 NOTE — Telephone Encounter (Addendum)
[place]  [phone] [phone]  Patient Care Team: Azzie Glatter, FNP as PCP - General (Family Medicine) [name], MD as PCP - Cardiology (Cardiology) Corean Yoshimura Ingles Candee Furbish, MD as Consulting Physician (Urology)   Name of the patient: Daniel Weiss  [id number]  12-05-1960   Date of visit: 02/10/20  HPI: Patient is a 59 y.o. male with newly diagnosed metastatic castration sensitive prostate cancer.  Reason for Consult: Zytiga (abiraterone) oral chemotherapy education.   PAST MEDICAL HISTORY: Past Medical History:  Diagnosis Date  . Abnormal chest CT 02/2018   a. Ectatic 3 cm Ao arch - rec f/u outpt imaging w/ CTA or MRA. Ectatic atheromatous abd Ao @ risk for aneurysm - rec f/u u/s in 5 yrs. Marked prostatic enlargement w/ scattered small sclerotic foci in the thoracic lower lumbar spine, sacrum, left 12th rib, pelvis, and right femur.  Recommend elective outpatient whole-body bone scintigraphy and PSA.  Marland Kitchen CAD (coronary artery disease)    a. 02/2018 ACS/PCI: LM nl, LAD 85p (3.5x15 Sierra DES), D1 45ost, RI 55ost, RCA nl, EF 25-35%.  . Cardiac murmur   . Complete left bundle branch block (LBBB) 2016  . Dyspnea   . HFrEF (heart failure with reduced ejection fraction) ([place])   . Hypertension   . Ischemic cardiomyopathy 02/2018   a. 02/2018 Echo: EF 35-40% (LV gram on cath was 25-30%), mod, diff HK. mid-apicalanteroseptal, ant, and apical sev HK. RVH.-->  No notable change on echo from June 2020 and August 2020.  Marland Kitchen Myocardial infarction ([place])   . NSTEMI (non-ST elevated myocardial infarction) ([place]) 02/2018   85% pLAD - DES PCI   . Prostate cancer metastatic to multiple sites [facility]) 11/2018  . Stroke ([place])   . TIA (transient ischemic attack) 2016  . Warthin's tumor     PAST SURGICAL HISTORY:  Past Surgical History:  Procedure Laterality Date  . CORONARY STENT INTERVENTION N/A 03/06/2018    Procedure: CORONARY STENT INTERVENTION;  Surgeon: [name], MD;  Location: [facility];  Service: Cardiovascular: Proximal LAD 85% stenosis (and D1): DES PCI with Xience Sierra DES 3.5 mm x 15 mm - 3.9 mm  . INTRAOPERATIVE TRANSTHORACIC ECHOCARDIOGRAM  03/06/2018   (Peri-MI) mild reduced EF 35-40%.  Diffuse hypokinesis (severe HK of the mid-apical anteroseptal, anterior and apical myocardium consistent with LAD infarct).  Unable to assess diastolic function.  Paradoxical septal motion likely related to his LBBB.  RV hypertrophy noted.  Trivial pericardial effusion noted.   Marland Kitchen LEFT HEART CATH AND CORONARY ANGIOGRAPHY N/A 03/06/2018   Procedure: LEFT HEART CATH AND CORONARY ANGIOGRAPHY;  Surgeon: [name], MD;  Location: [facility];  Service: Cardiovascular: Proximal LAD 85% involving D1 45%.  Ostial RI 55%.  Severe LV dysfunction EF 25 to 30%.  1+ MR.  Only minimally elevated LVEDP.  Marland Kitchen ORCHIECTOMY Right 01/17/2020   Procedure: ORCHIECTOMY;  Surgeon: Abbie Sons, MD;  Location: [facility];  Service: Urology;  Laterality: Right;  . PROSTATE BIOPSY N/A 01/17/2020   Procedure: BIOPSY TRANSRECTAL ULTRASONIC PROSTATE (TUBP);  Surgeon: Abbie Sons, MD;  Location: [facility];  Service: Urology;  Laterality: N/A;  . TRANSTHORACIC ECHOCARDIOGRAM  10/2018   (Most recent echo, to evaluate for TIA/CVA) : Moderate reduced EF 35 to 40%.  Moderate concentric LVH.  Mild dyskinesis of the apex and severe hypokinesis of mid apical anteroseptal and anterior wall.  Unable to assess RV pressures.  Aortic sclerosis with no stenosis.  No significant change seen    HEMATOLOGY/ONCOLOGY HISTORY:  Oncology History   No history exists.    ALLERGIES:  has No Known Allergies.  MEDICATIONS:  Current Outpatient Medications  Medication Sig Dispense Refill  . albuterol (VENTOLIN HFA) 108 (90 Base) MCG/ACT inhaler Inhale 2 puffs into the lungs every 6 (six) hours as needed for wheezing or  shortness of breath. 8 g 11  . bicalutamide (CASODEX) 50 MG tablet Take 1 tablet by mouth twice daily (Patient taking differently: Take 50 mg by mouth in the morning and at bedtime. ) 60 tablet 0  . clopidogrel (PLAVIX) 75 MG tablet Take 1 tablet (75 mg total) by mouth daily. 90 tablet 3  . furosemide (LASIX) 20 MG tablet May take 20 mg tablet as needed for increase shortness of breath or swelling (Patient taking differently: Take 20 mg by mouth daily as needed for edema (shortness of breath). ) 20 tablet 6  . metoprolol succinate (TOPROL-XL) 25 MG 24 hr tablet Take 1 tablet (25 mg total) by mouth daily with supper. 45 tablet 1  . nitroGLYCERIN (NITROSTAT) 0.4 MG SL tablet Place 1 tablet (0.4 mg total) under the tongue every 5 (five) minutes as needed for chest pain. (Patient not taking: Reported on 02/10/2020) 25 tablet 3  . ondansetron (ZOFRAN) 4 MG tablet Take 4 mg by mouth every 4 (four) hours as needed for nausea or vomiting.     Marland Kitchen oxyCODONE-acetaminophen (PERCOCET/ROXICET) 5-325 MG tablet Take 1 tablet by mouth every 6 (six) hours as needed for severe pain. (Patient not taking: Reported on 02/10/2020) 15 tablet 0  . rosuvastatin (CRESTOR) 40 MG tablet Take 1 tablet by mouth once daily (Patient taking differently: Take 40 mg by mouth daily. ) 30 tablet 1  . sacubitril-valsartan (ENTRESTO) 24-26 MG Take 1/2 tablet in the morning and 1 tablet in the evening (Patient taking differently: Take 0.5 tablets by mouth 2 (two) times daily. ) 60 tablet 8  . tamsulosin (FLOMAX) 0.4 MG CAPS capsule Take 1 capsule (0.4 mg total) by mouth daily after supper. 90 capsule 3  . venlafaxine XR (EFFEXOR XR) 37.5 MG 24 hr capsule Take 1 capsule (37.5 mg total) by mouth daily with breakfast. 30 capsule 1   No current facility-administered medications for this visit.    VITAL SIGNS: @VS @ There were no vitals filed for this visit.  Estimated body mass index is 20.55 kg/m as calculated from the following:   Height as  of 01/31/20: 6' (1.829 m).   Weight as of an earlier encounter on 02/10/20: 68.7 kg (151 lb 8 oz).  LABS: CBC:    Component Value Date/Time   WBC 4.7 02/10/2020 1000   HGB 11.9 (L) 02/10/2020 1000   HGB 11.0 (L) 02/02/2019 0906   HCT 35.4 (L) 02/10/2020 1000   HCT 35.2 (L) 02/02/2019 0906   PLT 273 02/10/2020 1000   PLT 460 (H) 02/02/2019 0906   MCV 79.7 (L) 02/10/2020 1000   MCV 76 (L) 02/02/2019 0906   NEUTROABS 2.0 02/10/2020 1000   NEUTROABS 4.1 11/22/2018 1345   LYMPHSABS 2.2 02/10/2020 1000   LYMPHSABS 2.3 11/22/2018 1345   MONOABS 0.5 02/10/2020 1000   EOSABS 0.0 02/10/2020 1000   EOSABS 0.1 11/22/2018 1345   BASOSABS 0.0 02/10/2020 1000   BASOSABS 0.1 11/22/2018 1345   Comprehensive Metabolic Panel:    Component Value Date/Time   NA 132 (L) 02/10/2020 1000   NA 139 02/02/2019 0906  K 4.3 02/10/2020 1000   CL 96 (L) 02/10/2020 1000   CO2 24 02/10/2020 1000   BUN 14 02/10/2020 1000   BUN 10 02/02/2019 0906   CREATININE 0.92 02/10/2020 1000   GLUCOSE 126 (H) 02/10/2020 1000   CALCIUM 9.1 02/10/2020 1000   AST 64 (H) 02/10/2020 1000   ALT 39 02/10/2020 1000   ALKPHOS 53 02/10/2020 1000   BILITOT 0.6 02/10/2020 1000   BILITOT 0.2 06/07/2019 1253   PROT 7.1 02/10/2020 1000   PROT 7.0 06/07/2019 1253   ALBUMIN 3.7 02/10/2020 1000   ALBUMIN 4.2 06/07/2019 1253    RADIOGRAPHIC STUDIES: [place] Transrectal Complete  Result Date: 01/17/2020 Please see Notes tab for imaging impression.  [place] PROSTATE BIOPSY MULTIPLE  Result Date: 01/17/2020 Please see Notes tab for imaging impression.   Assessment and Plan-  Working on medication access. The plan is for Mr. Weigold to get started when he has his medication in hand and f/u in office after initiation.    Patient Education I spoke with patient and his daughter Daniel Weiss (daughter on the phone) for overview of new oral chemotherapy medication: Zytiga (abiraterone) for the treatment of metastatic castration sensitive  prostate cancer, planned duration until disease progression or unacceptable drug toxicity.   Counseled patient on administration, dosing, side effects, monitoring, drug-food interactions, safe handling, storage, and disposal. Also discussed prednisone with patient.  Side effects include but not limited to: fatigue, HTN, edema.    Patient has a blood pressure cuff at home, encouraged patient to check BP a couple times a week at home. Ronnise mentioned that the patient sometimes has elevated white coat blood pressure.  Reviewed with patient importance of keeping a medication schedule and plan for any missed doses.  After discussion with patient no patient barriers to medication adherence identified.   Mr. Hawkes and Daniel Weiss voiced understanding and appreciation. All questions answered. Medication and prostate cancer handout provided.  Provided patient with [facility] phone number. Patient knows to call the office with questions or concerns. Oral Chemotherapy [facility] will continue to follow.  Medication Access Issues: patient appear to have medicaid coverage, not anticipating any issues  Patient expressed understanding and was in agreement with this plan. He also understands that He can call clinic at any time with any questions, concerns, or complaints.   Thank you for allowing me to participate in the care of this very pleasant patient.   Time Total: 30 mins  Visit consisted of counseling and education on dealing with issues of symptom management in the setting of serious and potentially life-threatening illness.Greater than 50%  of this time was spent counseling and coordinating care related to the above assessment and plan.  Signed by: Darl Pikes, PharmD, BCPS, Salley Slaughter, CPP Hematology/Oncology Clinical Pharmacist Practitioner [facility]/HP/AP [facility] [phone]  02/10/2020 11:38 AM

## 2020-02-11 LAB — AFP TUMOR MARKER: AFP, Serum, Tumor Marker: 3.9 ng/mL (ref 0.0–8.3)

## 2020-02-11 LAB — BETA HCG QUANT (REF LAB): hCG Quant: <1 m[IU]/mL (ref 0–3)

## 2020-02-13 ENCOUNTER — Telehealth: Payer: Self-pay

## 2020-02-13 DIAGNOSIS — C801 Malignant (primary) neoplasm, unspecified: Secondary | ICD-10-CM

## 2020-02-13 DIAGNOSIS — K118 Other diseases of salivary glands: Secondary | ICD-10-CM

## 2020-02-13 DIAGNOSIS — C61 Malignant neoplasm of prostate: Secondary | ICD-10-CM

## 2020-02-13 MED FILL — ABIRATERONE ACETATE 250 MG: 250 | 30 days supply | Qty: 120 | Fill #0

## 2020-02-13 NOTE — Telephone Encounter (Signed)
Oral Oncology Patient Advocate Encounter  I spoke with Daniel Weiss this afternoon to set up delivery of Zytiga + Prednisone.  Address verified for shipment.  Zytiga and Prednisone will be filled through [facility] and mailed 02/13/20 for delivery 02/14/20.    [name] will call 7-10 days before next refill is due to complete adherence call and set up delivery of medication.     [name] Patient [name] Phone [phone] Fax [phone] 02/13/2020 3:20 PM

## 2020-02-13 NOTE — Telephone Encounter (Signed)
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

## 2020-02-13 NOTE — Telephone Encounter (Signed)
Well let's send referral to [facility], please specify Dr.Vaught Thanks.

## 2020-02-13 NOTE — Telephone Encounter (Signed)
Referral orders entered.

## 2020-02-13 NOTE — Telephone Encounter (Signed)
Dr. Pollie Friar office does not accept his insurance, [place].

## 2020-02-14 ENCOUNTER — Other Ambulatory Visit: Payer: Self-pay

## 2020-02-14 DIAGNOSIS — C61 Malignant neoplasm of prostate: Secondary | ICD-10-CM

## 2020-02-14 DIAGNOSIS — C801 Malignant (primary) neoplasm, unspecified: Secondary | ICD-10-CM

## 2020-02-14 NOTE — Telephone Encounter (Signed)
Order entered and notes faxed to Dr. Darien Ramus office.

## 2020-02-14 NOTE — Telephone Encounter (Signed)
Referral for second opinion- prostate cancer faxed to [facility]   Fax: [phone] Phone: [phone]

## 2020-02-17 ENCOUNTER — Other Ambulatory Visit: Payer: Self-pay | Admitting: Urology

## 2020-02-17 NOTE — Telephone Encounter (Signed)
Spoke to referral coordinator at [facility] and she said that they received referral and have been trying to contact pt but have been unable to reach him and a letter was mailed out. Called pt and notified him and provided him with phone number so he can call them and set up appt.  Called Dr. Milas Hock office to follow up on referral but they were closed.

## 2020-02-20 ENCOUNTER — Other Ambulatory Visit: Payer: Self-pay | Admitting: *Deleted

## 2020-02-20 ENCOUNTER — Other Ambulatory Visit: Payer: Self-pay

## 2020-02-20 ENCOUNTER — Ambulatory Visit
Admission: RE | Admit: 2020-02-20 | Discharge: 2020-02-20 | Disposition: A | Discharge status: Home / Self Care | Payer: Medicaid Other | Source: Ambulatory Visit | Attending: Radiation Oncology | Admitting: Radiation Oncology

## 2020-02-20 VITALS — BP 124/77 | HR 92 | Temp 96.0°F | Resp 16 | Wt 152.1 lb

## 2020-02-20 DIAGNOSIS — Z923 Personal history of irradiation: Secondary | ICD-10-CM | POA: Diagnosis not present

## 2020-02-20 DIAGNOSIS — I2510 Atherosclerotic heart disease of native coronary artery without angina pectoris: Secondary | ICD-10-CM | POA: Diagnosis not present

## 2020-02-20 DIAGNOSIS — Z9221 Personal history of antineoplastic chemotherapy: Secondary | ICD-10-CM | POA: Insufficient documentation

## 2020-02-20 DIAGNOSIS — C6211 Malignant neoplasm of descended right testis: Secondary | ICD-10-CM | POA: Diagnosis present

## 2020-02-20 DIAGNOSIS — Z8673 Personal history of transient ischemic attack (TIA), and cerebral infarction without residual deficits: Secondary | ICD-10-CM | POA: Diagnosis not present

## 2020-02-20 DIAGNOSIS — R011 Cardiac murmur, unspecified: Secondary | ICD-10-CM | POA: Diagnosis not present

## 2020-02-20 DIAGNOSIS — I447 Left bundle-branch block, unspecified: Secondary | ICD-10-CM | POA: Diagnosis not present

## 2020-02-20 DIAGNOSIS — C7951 Secondary malignant neoplasm of bone: Secondary | ICD-10-CM | POA: Insufficient documentation

## 2020-02-20 DIAGNOSIS — C801 Malignant (primary) neoplasm, unspecified: Secondary | ICD-10-CM

## 2020-02-20 DIAGNOSIS — C61 Malignant neoplasm of prostate: Secondary | ICD-10-CM | POA: Insufficient documentation

## 2020-02-20 DIAGNOSIS — I252 Old myocardial infarction: Secondary | ICD-10-CM | POA: Insufficient documentation

## 2020-02-20 DIAGNOSIS — I255 Ischemic cardiomyopathy: Secondary | ICD-10-CM | POA: Diagnosis not present

## 2020-02-20 NOTE — Telephone Encounter (Signed)
[facility] and was informed that they received referral and they have called ptand were unable to reach him. They left a VM and are waiting for a call back from him to schedule him.

## 2020-02-20 NOTE — Consult Note (Signed)
NEW PATIENT EVALUATION  Name: Daniel Weiss  MRN: [id number]  Date:   02/20/2020     DOB: 1960/05/17   This 59 y.o. 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  REFERRING PHYSICIAN: Azzie Glatter, FNP  CHIEF COMPLAINT:  Chief Complaint  Patient presents with  . 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   PREVIOUS INVESTIGATIONS:  CT scans of abdomen pelvis and bone scan reviewed PET CT scan ordered Clinical notes reviewed Pathology report reviewed  HPI: Patient is a 59 year old male presenting in 2020 with stage IV prostate cancer presenting with a PSA of 509.  He had multiple sclerotic lesions at the time of biopsy.  Bone scan showed punctate focus of activity in the left first sacral segment.  He had been treated with Lupron and Casodex eventually switched to Eligard.  His last PSA was in June 2021 with a PSA of 13.  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  He also had prostate biopsies in November showing widespread Gleason mostly 7 (4+3).  He also had a right orchiectomy with testicular scar and extensive tubular atrophy with residual germ cell tumor not identified.Patient has extensive cardiology issues.  He follows up with Dr. Ellyn Hack. Patient has history of STEMI-PCI to LAD with resultant ischemia cardiomyopathy, EF 35 to 40%, left bundle branch block with acute CVA-10/2018.    Patient also has a right neck mass which according to patient and reports has been present for years and has been biopsied.  Patient is scheduled for a second opinion at [facility].  He is seen today for evaluation he is doing  fairly well is fairly asymptomatic.  PLANNED TREATMENT REGIMEN: Possible radiation therapy for seminoma.  PAST MEDICAL HISTORY:  has a past medical history of Abnormal chest CT (02/2018), CAD (coronary artery disease), Cardiac murmur, Complete left bundle branch block (LBBB) (2016), Dyspnea, HFrEF (heart failure with reduced ejection fraction) ([place]), Hypertension, Ischemic cardiomyopathy (02/2018), Myocardial infarction [facility]), NSTEMI (non-ST elevated myocardial infarction) ([place]) (02/2018), Prostate cancer metastatic to multiple sites [facility]) (11/2018), Stroke [facility]), TIA (transient ischemic attack) (2016), and Warthin's tumor.    PAST SURGICAL HISTORY:  Past Surgical History:  Procedure Laterality Date  . CORONARY STENT INTERVENTION N/A 03/06/2018   Procedure: CORONARY STENT INTERVENTION;  Surgeon: [name], MD;  Location: [facility];  Service: Cardiovascular: Proximal LAD 85% stenosis (and D1): DES PCI with Xience Sierra DES 3.5 mm x 15 mm - 3.9 mm  . INTRAOPERATIVE TRANSTHORACIC ECHOCARDIOGRAM  03/06/2018   (Peri-MI) mild reduced EF 35-40%.  Diffuse hypokinesis (severe HK of the mid-apical anteroseptal, anterior and apical myocardium consistent with LAD infarct).  Unable to assess diastolic function.  Paradoxical septal motion likely related to his LBBB.  RV hypertrophy noted.  Trivial pericardial effusion noted.   Marland Kitchen LEFT HEART CATH AND CORONARY ANGIOGRAPHY N/A 03/06/2018   Procedure: LEFT HEART CATH AND CORONARY ANGIOGRAPHY;  Surgeon: [name], MD;  Location: [facility];  Service: Cardiovascular: Proximal LAD 85% involving D1 45%.  Ostial RI 55%.  Severe LV dysfunction EF 25 to 30%.  1+ MR.  Only minimally elevated LVEDP.  Marland Kitchen  ORCHIECTOMY Right 01/17/2020   Procedure: ORCHIECTOMY;  Surgeon: Abbie Sons, MD;  Location: [facility];  Service: Urology;  Laterality: Right;  . PROSTATE BIOPSY N/A 01/17/2020   Procedure: BIOPSY TRANSRECTAL ULTRASONIC PROSTATE (TUBP);   Surgeon: Abbie Sons, MD;  Location: [facility];  Service: Urology;  Laterality: N/A;  . TRANSTHORACIC ECHOCARDIOGRAM  10/2018   (Most recent echo, to evaluate for TIA/CVA) : Moderate reduced EF 35 to 40%.  Moderate concentric LVH.  Mild dyskinesis of the apex and severe hypokinesis of mid apical anteroseptal and anterior wall.  Unable to assess RV pressures.  Aortic sclerosis with no stenosis.  No significant change seen    FAMILY HISTORY: family history includes Breast cancer in an other family member; Diabetes type II in his mother; Hypertension in his mother; Leukemia in an other family member; Stroke in his brother and mother.  SOCIAL HISTORY:  reports that he has been smoking cigarettes. He has been smoking about 0.00 packs per day for the past 0.00 years. He has never used smokeless tobacco. He reports current alcohol use of about 2.0 standard drinks of alcohol per week. He reports current drug use. Frequency: 3.00 times per week. Drug: Marijuana.  ALLERGIES: Patient has no known allergies.  MEDICATIONS:  Current Outpatient Medications  Medication Sig Dispense Refill  . abiraterone acetate (ZYTIGA) 250 MG tablet Take 4 tablets (1,000 mg total) by mouth daily. Take on an empty stomach 1 hour before or 2 hours after a meal 120 tablet 0  . albuterol (VENTOLIN HFA) 108 (90 Base) MCG/ACT inhaler Inhale 2 puffs into the lungs every 6 (six) hours as needed for wheezing or shortness of breath. 8 g 11  . clopidogrel (PLAVIX) 75 MG tablet Take 1 tablet (75 mg total) by mouth daily. 90 tablet 3  . furosemide (LASIX) 20 MG tablet May take 20 mg tablet as needed for increase shortness of breath or swelling (Patient taking differently: Take 20 mg by mouth daily as needed for edema (shortness of breath).) 20 tablet 6  . metoprolol succinate (TOPROL-XL) 25 MG 24 hr tablet Take 1 tablet (25 mg total) by mouth daily with supper. 45 tablet 1  . ondansetron (ZOFRAN) 4 MG tablet Take 4 mg by mouth every 4  (four) hours as needed for nausea or vomiting.     . predniSONE (DELTASONE) 5 MG tablet Take 1 tablet (5 mg total) by mouth daily with breakfast. 30 tablet 3  . rosuvastatin (CRESTOR) 40 MG tablet Take 1 tablet by mouth once daily (Patient taking differently: Take 40 mg by mouth daily.) 30 tablet 1  . sacubitril-valsartan (ENTRESTO) 24-26 MG Take 1/2 tablet in the morning and 1 tablet in the evening (Patient taking differently: Take 0.5 tablets by mouth 2 (two) times daily.) 60 tablet 8  . tamsulosin (FLOMAX) 0.4 MG CAPS capsule Take 1 capsule (0.4 mg total) by mouth daily after supper. 90 capsule 3  . venlafaxine XR (EFFEXOR XR) 37.5 MG 24 hr capsule Take 1 capsule (37.5 mg total) by mouth daily with breakfast. 30 capsule 1  . nitroGLYCERIN (NITROSTAT) 0.4 MG SL tablet Place 1 tablet (0.4 mg total) under the tongue every 5 (five) minutes as needed for chest pain. (Patient not taking: No sig reported) 25 tablet 3  . oxyCODONE-acetaminophen (PERCOCET/ROXICET) 5-325 MG tablet Take 1 tablet by mouth every 6 (six) hours as needed for severe pain. (Patient not taking: No sig reported) 15 tablet 0   No current facility-administered medications for this encounter.  ECOG PERFORMANCE STATUS:  0 - Asymptomatic  REVIEW OF SYSTEMS: Patient has multiple medical problems including TIA stroke hypertension congestive heart failure left bundle branch block and tobacco use. Patient denies any weight loss, fatigue, weakness, fever, chills or night sweats. Patient denies any loss of vision, blurred vision. Patient denies any ringing  of the ears or hearing loss. No irregular heartbeat. Patient denies heart murmur or history of fainting. Patient denies any chest pain or pain radiating to her upper extremities. Patient denies any shortness of breath, difficulty breathing at night, cough or hemoptysis. Patient denies any swelling in the lower legs. Patient denies any nausea vomiting, vomiting of blood, or coffee ground  material in the vomitus. Patient denies any stomach pain. Patient states has had normal bowel movements no significant constipation or diarrhea. Patient denies any dysuria, hematuria or significant nocturia. Patient denies any problems walking, swelling in the joints or loss of balance. Patient denies any skin changes, loss of hair or loss of weight. Patient denies any excessive worrying or anxiety or significant depression. Patient denies any problems with insomnia. Patient denies excessive thirst, polyuria, polydipsia. Patient denies any swollen glands, patient denies easy bruising or easy bleeding. Patient denies any recent infections, allergies or URI. Patient "s visual fields have not changed significantly in recent time.   PHYSICAL EXAM: BP 124/77 (BP Location: Left Arm, Patient Position: Sitting)   Pulse 92   Temp (!) 96 F (35.6 C) (Tympanic)   Resp 16   Wt 152 lb 1.6 oz (69 kg)   BMI 20.63 kg/m  Patient is a large right cervical mass.  Well-developed well-nourished patient in NAD. HEENT reveals PERLA, EOMI, discs not visualized.  Oral cavity is clear. No oral mucosal lesions are identified. Neck is clear without evidence of cervical or supraclavicular adenopathy. Lungs are clear to A&P. Cardiac examination is essentially unremarkable with regular rate and rhythm without murmur rub or thrill. Abdomen is benign with no organomegaly or masses noted. Motor sensory and DTR levels are equal and symmetric in the upper and lower extremities. Cranial nerves II through XII are grossly intact. Proprioception is intact. No peripheral adenopathy or edema is identified. No motor or sensory levels are noted. Crude visual fields are within normal range.  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  Based on those findings we will make definitive recommendations as far as radiation therapy for his presumed seminoma.  At this time see no role for palliative radiation therapy to his bone lesions since they are controlled and he seems to be responding well to his Eligard therapy.  We will make further recommendations and explained details of treatment after review PET scan.  Patient comprehends my recommendations well.  Follow-up and PET CT scan were ordered.  I would like to take this opportunity to thank you for allowing me to participate in the care of your patient.Noreene Filbert, MD

## 2020-02-22 ENCOUNTER — Encounter (HOSPITAL_COMMUNITY): Payer: Self-pay | Admitting: *Deleted

## 2020-02-22 ENCOUNTER — Emergency Department (HOSPITAL_COMMUNITY): Payer: Medicaid Other

## 2020-02-22 ENCOUNTER — Other Ambulatory Visit: Payer: Self-pay

## 2020-02-22 ENCOUNTER — Inpatient Hospital Stay (HOSPITAL_COMMUNITY)
Admission: EM | Admit: 2020-02-22 | Discharge: 2020-02-28 | DRG: 378 | Disposition: A | Discharge status: Home / Self Care | Payer: Medicaid Other | Attending: Internal Medicine | Admitting: Internal Medicine

## 2020-02-22 DIAGNOSIS — Z8673 Personal history of transient ischemic attack (TIA), and cerebral infarction without residual deficits: Secondary | ICD-10-CM

## 2020-02-22 DIAGNOSIS — R079 Chest pain, unspecified: Secondary | ICD-10-CM

## 2020-02-22 DIAGNOSIS — D126 Benign neoplasm of colon, unspecified: Secondary | ICD-10-CM | POA: Diagnosis present

## 2020-02-22 DIAGNOSIS — R55 Syncope and collapse: Secondary | ICD-10-CM

## 2020-02-22 DIAGNOSIS — E871 Hypo-osmolality and hyponatremia: Secondary | ICD-10-CM | POA: Diagnosis present

## 2020-02-22 DIAGNOSIS — Z803 Family history of malignant neoplasm of breast: Secondary | ICD-10-CM

## 2020-02-22 DIAGNOSIS — F419 Anxiety disorder, unspecified: Secondary | ICD-10-CM | POA: Diagnosis present

## 2020-02-22 DIAGNOSIS — I447 Left bundle-branch block, unspecified: Secondary | ICD-10-CM | POA: Diagnosis present

## 2020-02-22 DIAGNOSIS — I2510 Atherosclerotic heart disease of native coronary artery without angina pectoris: Secondary | ICD-10-CM | POA: Diagnosis present

## 2020-02-22 DIAGNOSIS — F17210 Nicotine dependence, cigarettes, uncomplicated: Secondary | ICD-10-CM | POA: Diagnosis present

## 2020-02-22 DIAGNOSIS — Z806 Family history of leukemia: Secondary | ICD-10-CM

## 2020-02-22 DIAGNOSIS — I110 Hypertensive heart disease with heart failure: Secondary | ICD-10-CM | POA: Diagnosis present

## 2020-02-22 DIAGNOSIS — K449 Diaphragmatic hernia without obstruction or gangrene: Secondary | ICD-10-CM | POA: Diagnosis present

## 2020-02-22 DIAGNOSIS — D649 Anemia, unspecified: Secondary | ICD-10-CM

## 2020-02-22 DIAGNOSIS — E861 Hypovolemia: Secondary | ICD-10-CM | POA: Diagnosis present

## 2020-02-22 DIAGNOSIS — I255 Ischemic cardiomyopathy: Secondary | ICD-10-CM | POA: Diagnosis present

## 2020-02-22 DIAGNOSIS — K922 Gastrointestinal hemorrhage, unspecified: Secondary | ICD-10-CM | POA: Diagnosis present

## 2020-02-22 DIAGNOSIS — Z833 Family history of diabetes mellitus: Secondary | ICD-10-CM

## 2020-02-22 DIAGNOSIS — C61 Malignant neoplasm of prostate: Secondary | ICD-10-CM | POA: Diagnosis present

## 2020-02-22 DIAGNOSIS — K648 Other hemorrhoids: Secondary | ICD-10-CM | POA: Diagnosis present

## 2020-02-22 DIAGNOSIS — K31811 Angiodysplasia of stomach and duodenum with bleeding: Principal | ICD-10-CM | POA: Diagnosis present

## 2020-02-22 DIAGNOSIS — Z823 Family history of stroke: Secondary | ICD-10-CM

## 2020-02-22 DIAGNOSIS — Z8249 Family history of ischemic heart disease and other diseases of the circulatory system: Secondary | ICD-10-CM

## 2020-02-22 DIAGNOSIS — K625 Hemorrhage of anus and rectum: Secondary | ICD-10-CM

## 2020-02-22 DIAGNOSIS — Z7902 Long term (current) use of antithrombotics/antiplatelets: Secondary | ICD-10-CM

## 2020-02-22 DIAGNOSIS — E785 Hyperlipidemia, unspecified: Secondary | ICD-10-CM | POA: Diagnosis present

## 2020-02-22 DIAGNOSIS — I252 Old myocardial infarction: Secondary | ICD-10-CM

## 2020-02-22 DIAGNOSIS — D62 Acute posthemorrhagic anemia: Secondary | ICD-10-CM

## 2020-02-22 DIAGNOSIS — K920 Hematemesis: Secondary | ICD-10-CM | POA: Diagnosis present

## 2020-02-22 DIAGNOSIS — Z20822 Contact with and (suspected) exposure to covid-19: Secondary | ICD-10-CM | POA: Diagnosis present

## 2020-02-22 DIAGNOSIS — I5042 Chronic combined systolic (congestive) and diastolic (congestive) heart failure: Secondary | ICD-10-CM | POA: Diagnosis present

## 2020-02-22 DIAGNOSIS — Z955 Presence of coronary angioplasty implant and graft: Secondary | ICD-10-CM

## 2020-02-22 DIAGNOSIS — K621 Rectal polyp: Secondary | ICD-10-CM | POA: Diagnosis present

## 2020-02-22 LAB — CBC
HCT: 33.2 % — ABNORMAL LOW (ref 39.0–52.0)
Hemoglobin: 10.6 g/dL — ABNORMAL LOW (ref 13.0–17.0)
MCH: 26.4 pg (ref 26.0–34.0)
MCHC: 31.9 g/dL (ref 30.0–36.0)
MCV: 82.6 fL (ref 80.0–100.0)
Platelets: 319 10*3/uL (ref 150–400)
RBC: 4.02 MIL/uL — ABNORMAL LOW (ref 4.22–5.81)
RDW: 16.6 % — ABNORMAL HIGH (ref 11.5–15.5)
WBC: 6.8 10*3/uL (ref 4.0–10.5)
nRBC: 0 % (ref 0.0–0.2)

## 2020-02-22 LAB — BASIC METABOLIC PANEL
Anion gap: 10 (ref 5–15)
BUN: 9 mg/dL (ref 6–20)
CO2: 27 mmol/L (ref 22–32)
Calcium: 9.9 mg/dL (ref 8.9–10.3)
Chloride: 96 mmol/L — ABNORMAL LOW (ref 98–111)
Creatinine, Ser: 1.02 mg/dL (ref 0.61–1.24)
GFR, Estimated: >60 mL/min (ref >60)
Glucose, Bld: 118 mg/dL — ABNORMAL HIGH (ref 70–99)
Potassium: 4.2 mmol/L (ref 3.5–5.1)
Sodium: 133 mmol/L — ABNORMAL LOW (ref 135–145)

## 2020-02-22 LAB — POC OCCULT BLOOD, ED: Fecal Occult Bld: POSITIVE — AB

## 2020-02-22 LAB — TROPONIN I (HIGH SENSITIVITY): Troponin I (High Sensitivity): 42 ng/L — ABNORMAL HIGH (ref <18)

## 2020-02-22 NOTE — ED Triage Notes (Signed)
The pt is c/o chest pain since 1800 he arrived by [facility] from home he also has numbness in his rt arm  Stroke  Screen negative  Some son dizziness no acute distress at present nitro x2  No iv

## 2020-02-22 NOTE — ED Provider Notes (Signed)
[name]   CSN: [id number] Arrival date & time: 02/22/20  2114     History Chief Complaint  Patient presents with  . Chest Pain    Daniel Weiss is a 59 y.o. male.  The history is provided by the patient.  Chest Pain Pain location:  Substernal area Pain quality: burning   Pain radiates to:  Does not radiate Pain severity:  Moderate Timing:  Constant Progression:  Improving Chronicity:  New Relieved by:  Nitroglycerin Worsened by:  Nothing Associated symptoms: shortness of breath, syncope and vomiting   Associated symptoms: no back pain and no fever    Patient presents for multiple complaints. 1.  He reports he has had onset of chest pain approximately 6 hours ago.  He reports he began having pain and numbness in his right arm associated with central chest burning.  He also reported dizziness.  No other focal weakness is reported.  The chest pain is now improving with nitroglycerin  2.  Patient also reports has been having bloody stool recently and now having black stool.  He also " has been spitting up blood" reports he is on "blood thinners "  3.  Patient also reports he has had 2 syncopal episodes in the past 48 hours.  Reports he is walking and he will just blackout.  He did hit his head, but denies any other injuries    Past Medical History:  Diagnosis Date  . Abnormal chest CT 02/2018   a. Ectatic 3 cm Ao arch - rec f/u outpt imaging w/ CTA or MRA. Ectatic atheromatous abd Ao @ risk for aneurysm - rec f/u u/s in 5 yrs. Marked prostatic enlargement w/ scattered small sclerotic foci in the thoracic lower lumbar spine, sacrum, left 12th rib, pelvis, and right femur.  Recommend elective outpatient whole-body bone scintigraphy and PSA.  Marland Kitchen CAD (coronary artery disease)    a. 02/2018 ACS/PCI: LM nl, LAD 85p (3.5x15 Sierra DES), D1 45ost, RI 55ost, RCA nl, EF 25-35%.  . Cardiac murmur   . Complete left bundle branch  block (LBBB) 2016  . Dyspnea   . HFrEF (heart failure with reduced ejection fraction) ([place])   . Hypertension   . Ischemic cardiomyopathy 02/2018   a. 02/2018 Echo: EF 35-40% (LV gram on cath was 25-30%), mod, diff HK. mid-apicalanteroseptal, ant, and apical sev HK. RVH.-->  No notable change on echo from June 2020 and August 2020.  Marland Kitchen Myocardial infarction ([place])   . NSTEMI (non-ST elevated myocardial infarction) ([place]) 02/2018   85% pLAD - DES PCI   . Prostate cancer metastatic to multiple sites [facility]) 11/2018  . Stroke ([place])   . TIA (transient ischemic attack) 2016  . Warthin's tumor     Patient Active Problem List   Diagnosis Date Noted  . Parotid mass 02/10/2020  . Goals of care, counseling/discussion 12/22/2019  . 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 Testicular mass 12/22/2019  . DOE (dyspnea on exertion) 11/28/2019  . Chest pain on exertion 11/28/2019  . History of non-ST elevation myocardial infarction (NSTEMI) 02/21/2019  . 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 History of CVA (cerebrovascular accident) 11/23/2018  . Elevated PSA 11/23/2018  . Long term (current) use of anticoagulants 11/16/2018  . Malnutrition of moderate degree 11/11/2018  . Acute cardioembolic stroke ([place])   . Acute cerebrovascular accident (CVA) ([place]) 11/03/2018  . Presence of drug coated stent in LAD coronary artery 03/26/2018  . Complete left  bundle branch block (LBBB) 03/26/2018  . Coronary artery disease involving native coronary artery of native heart with angina pectoris ([place]) 03/10/2018  . Hyperlipidemia LDL goal <70 03/10/2018  . Prostate cancer ([place]) 03/10/2018  . Abnormal CT scan 03/10/2018  . Ischemic cardiomyopathy 03/10/2018  . Hypokalemia   . Tobacco abuse   . Non-ST elevation (NSTEMI) myocardial infarction ([place]) 03/06/2018  . Sinus tachycardia 03/06/2018  . Encephalopathy, hypertensive   . ARF (acute renal failure) ([place]) 06/29/2015  . Hyponatremia 06/29/2015  . Dehydration  06/29/2015  . Prolonged QT interval 06/29/2015  . 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 H/O medication noncompliance   . Other headache syndrome   . Localized swelling, mass or lump of neck   . Neck mass   . History of TIA (transient ischemic attack)   . Paresthesia 07/07/2014  . Essential hypertension 07/07/2014  . Right sided weakness 07/07/2014  . Mass of right side of neck 07/07/2014    Past Surgical History:  Procedure Laterality Date  . CORONARY STENT INTERVENTION N/A 03/06/2018   Procedure: CORONARY STENT INTERVENTION;  Surgeon: [name], MD;  Location: [place] [facility];  Service: Cardiovascular: Proximal LAD 85% stenosis (and D1): DES PCI with Xience Sierra DES 3.5 mm x 15 mm - 3.9 mm  . INTRAOPERATIVE TRANSTHORACIC ECHOCARDIOGRAM  03/06/2018   (Peri-MI) mild reduced EF 35-40%.  Diffuse hypokinesis (severe HK of the mid-apical anteroseptal, anterior and apical myocardium consistent with LAD infarct).  Unable to assess diastolic function.  Paradoxical septal motion likely related to his LBBB.  RV hypertrophy noted.  Trivial pericardial effusion noted.   Marland Kitchen LEFT HEART CATH AND CORONARY ANGIOGRAPHY N/A 03/06/2018   Procedure: LEFT HEART CATH AND CORONARY ANGIOGRAPHY;  Surgeon: [name], MD;  Location: [place] [facility];  Service: Cardiovascular: Proximal LAD 85% involving D1 45%.  Ostial RI 55%.  Severe LV dysfunction EF 25 to 30%.  1+ MR.  Only minimally elevated LVEDP.  Marland Kitchen ORCHIECTOMY Right 01/17/2020   Procedure: ORCHIECTOMY;  Surgeon: Abbie Sons, MD;  Location: [facility] ORS;  Service: Urology;  Laterality: Right;  . PROSTATE BIOPSY N/A 01/17/2020   Procedure: BIOPSY TRANSRECTAL ULTRASONIC PROSTATE (TUBP);  Surgeon: Abbie Sons, MD;  Location: [facility] ORS;  Service: Urology;  Laterality: N/A;  . TRANSTHORACIC ECHOCARDIOGRAM  10/2018   (Most recent echo, to evaluate for TIA/CVA) : Moderate reduced EF 35 to 40%.  Moderate concentric LVH.  Mild dyskinesis of the apex and  severe hypokinesis of mid apical anteroseptal and anterior wall.  Unable to assess RV pressures.  Aortic sclerosis with no stenosis.  No significant change seen       Family History  Problem Relation Age of Onset  . Stroke Mother   . Hypertension Mother   . Diabetes type II Mother   . Leukemia Other   . Breast cancer Other   . Stroke Brother   . CAD Neg Hx     Social History   Tobacco Use  . Smoking status: Current Some Day Smoker    Packs/day: 0.00    Years: 0.00    Pack years: 0.00    Types: Cigarettes  . Smokeless tobacco: Never Used  . Tobacco comment: quit three days ago  Vaping Use  . Vaping Use: Never used  Substance Use Topics  . Alcohol use: Yes    Alcohol/week: 2.0 standard drinks    Types: 2 Cans of beer per week    Comment: 2 Beers daily.  Marland Kitchen  Drug use: Yes    Frequency: 3.0 times per week    Types: Marijuana    Home Medications Prior to Admission medications   Medication Sig Start Date End Date Taking? Authorizing Provider  abiraterone acetate (ZYTIGA) 250 MG tablet Take 4 tablets (1,000 mg total) by mouth daily. Take on an empty stomach 1 hour before or 2 hours after a meal 02/10/20   Earlie Server, MD  albuterol (VENTOLIN HFA) 108 (90 Base) MCG/ACT inhaler Inhale 2 puffs into the lungs every 6 (six) hours as needed for wheezing or shortness of breath. 05/23/19   Azzie Glatter, FNP  clopidogrel (PLAVIX) 75 MG tablet Take 1 tablet (75 mg total) by mouth daily. 11/28/19   [name], MD  furosemide (LASIX) 20 MG tablet May take 20 mg tablet as needed for increase shortness of breath or swelling Patient taking differently: Take 20 mg by mouth daily as needed for edema (shortness of breath). 05/30/19   [name], MD  metoprolol succinate (TOPROL-XL) 25 MG 24 hr tablet Take 1 tablet (25 mg total) by mouth daily with supper. 11/28/19   [name], MD  nitroGLYCERIN (NITROSTAT) 0.4 MG SL tablet Place 1 tablet (0.4 mg total) under the tongue every 5  (five) minutes as needed for chest pain. Patient not taking: No sig reported 03/10/18   Theora Gianotti, NP  ondansetron (ZOFRAN) 4 MG tablet Take 4 mg by mouth every 4 (four) hours as needed for nausea or vomiting.  10/16/19   [provider]  oxyCODONE-acetaminophen (PERCOCET/ROXICET) 5-325 MG tablet Take 1 tablet by mouth every 6 (six) hours as needed for severe pain. Patient not taking: No sig reported 01/17/20   Abbie Sons, MD  predniSONE (DELTASONE) 5 MG tablet Take 1 tablet (5 mg total) by mouth daily with breakfast. 02/10/20   Earlie Server, MD  rosuvastatin (CRESTOR) 40 MG tablet Take 1 tablet by mouth once daily Patient taking differently: Take 40 mg by mouth daily. 10/17/19   [name], MD  sacubitril-valsartan (ENTRESTO) 24-26 MG Take 1/2 tablet in the morning and 1 tablet in the evening Patient taking differently: Take 0.5 tablets by mouth 2 (two) times daily. 05/30/19   [name], MD  tamsulosin (FLOMAX) 0.4 MG CAPS capsule Take 1 capsule (0.4 mg total) by mouth daily after supper. 03/25/19   [name], MD  venlafaxine XR (EFFEXOR XR) 37.5 MG 24 hr capsule Take 1 capsule (37.5 mg total) by mouth daily with breakfast. 10/26/19   Stoioff, Ronda Fairly, MD    Allergies    Patient has no known allergies.  Review of Systems   Review of Systems  Constitutional: Negative for fever.  Respiratory: Positive for shortness of breath.   Cardiovascular: Positive for chest pain and syncope.  Gastrointestinal: Positive for blood in stool and vomiting.  Musculoskeletal: Negative for back pain and neck pain.  Neurological: Positive for syncope.  All other systems reviewed and are negative.   Physical Exam Updated Vital Signs BP 113/83   Pulse (!) 140   Temp 98.3 F (36.8 C) (Oral)   Resp 16   Ht 1.829 m (6')   Wt 68.9 kg   SpO2 98%   BMI 20.61 kg/m   Physical Exam CONSTITUTIONAL: Elderly, no acute distress HEAD: Normocephalic/atraumatic EYES:  EOMI/PERRL ENMT: Mucous membranes moist NECK: supple no meningeal signs SPINE/BACK:entire spine nontender, no bruising/crepitance/stepoffs noted to spine CV: S1/S2 noted, no murmurs/rubs/gallops noted LUNGS: Lungs are clear to  auscultation bilaterally, no apparent distress ABDOMEN: soft, nontender, no rebound or guarding, bowel sounds noted throughout abdomen GU:no cva tenderness, no inguinal hernia noted, chaperone present Rectal no blood or melena noted, Hemoccult positive.  Nurse chaperone present NEURO: Pt is awake/alert/appropriate, moves all extremitiesx4.  No facial droop.  No arm or leg drift.  Equal handgrips are noted EXTREMITIES: pulses normal/equal, full ROM, no deformities SKIN: warm, color normal PSYCH: no abnormalities of mood noted, alert and oriented to situation  ED Results / Procedures / Treatments   Labs (all labs ordered are listed, but only abnormal results are displayed) Labs Reviewed  BASIC METABOLIC PANEL - Abnormal; Notable for the following components:      Result Value   Sodium 133 (*)    Chloride 96 (*)    Glucose, Bld 118 (*)    All other components within normal limits  CBC - Abnormal; Notable for the following components:   RBC 4.02 (*)    Hemoglobin 10.6 (*)    HCT 33.2 (*)    RDW 16.6 (*)    All other components within normal limits  POC OCCULT BLOOD, ED - Abnormal; Notable for the following components:   Fecal Occult Bld POSITIVE (*)    All other components within normal limits  TROPONIN I (HIGH SENSITIVITY) - Abnormal; Notable for the following components:   Troponin I (High Sensitivity) 42 (*)    All other components within normal limits  TROPONIN I (HIGH SENSITIVITY) - Abnormal; Notable for the following components:   Troponin I (High Sensitivity) 61 (*)    All other components within normal limits  RESP PANEL BY RT-PCR (FLU A&B, COVID) [id number]    EKG EKG Interpretation  Date/Time:  Wednesday February 22 2020 21:23:47 EST Ventricular  Rate:  115 PR Interval:  134 QRS Duration: 134 QT Interval:  358 QTC Calculation: 495 R Axis:   89 Text Interpretation: Sinus tachycardia Biatrial enlargement Left ventricular hypertrophy with QRS widening and repolarization abnormality ( Cornell product ) Abnormal ECG No significant change since prior 5/21 Confirmed by Aletta Edouard [phone]) on 02/22/2020 9:34:37 PM   Radiology DG Chest 2 View  Result Date: 02/22/2020 CLINICAL DATA:  Chest pain EXAM: CHEST - 2 VIEW COMPARISON:  11/03/2018 FINDINGS: Heart and mediastinal contours are within normal limits. No focal opacities or effusions. No acute bony abnormality. IMPRESSION: No active cardiopulmonary disease. Electronically Signed   By: Rolm Baptise M.D.   On: 02/22/2020 21:50    Procedures Procedures  Medications Ordered in ED Medications - No data to display  ED Course  I have reviewed the triage vital signs and the nursing notes.  Pertinent labs & imaging results that were available during my care of the patient were reviewed by me and considered in my medical decision making (see chart for details).    MDM Rules/Calculators/A&P                         12:10 AM [place].  Patient was having chest pain earlier that is improving.  Reports 2 syncopal episodes recently.  Also reports recent GI bleed.  Hemoccult positive on exam, but hemoglobin is stable.  Due to multiple issues patient be admitted to the hospital.  Patient with history of CHF/CAD and prostate CA 2:20 AM CT head negative.  Minimal change in his troponin.  Patient resting comfortably without pain complaints.  He will need to be monitored for syncope and collapse as well as rectal bleeding.  This patient presents to the ED for concern of chest pain, this involves an extensive number of treatment options, and is a complaint that carries with it a high risk of complications and morbidity.  The differential diagnosis includes acute coronary syndrome, pulmonary embolism,  aortic dissection, pneumonia, pericarditis   Lab Tests:   I Ordered, reviewed, and interpreted labs, which included troponin, electrolytes, complete blood count, Hemoccult   Imaging Studies ordered:   I ordered imaging studies which included chest x-ray and CT head  I independently visualized and interpreted imaging which showed no acute findings  Additional history obtained:   Additional history obtained from sister  Previous records obtained and reviewed   Consultations Obtained:   I consulted Dr. Nevada Crane with [facility] hospitalist and discussed lab and imaging findings  Reevaluation:  After the interventions stated above, I reevaluated the patient and found patient resting comfortably chest pain-free    Final Clinical Impression(s) / ED Diagnoses Final diagnoses:  Chest pain, rule out acute myocardial infarction  Rectal bleeding  Syncope and collapse    Rx / DC Orders ED Discharge Orders    None       Ripley Fraise, MD 02/23/20 0221

## 2020-02-23 ENCOUNTER — Ambulatory Visit: Payer: Medicaid Other | Admitting: Cardiology

## 2020-02-23 ENCOUNTER — Observation Stay (HOSPITAL_BASED_OUTPATIENT_CLINIC_OR_DEPARTMENT_OTHER): Payer: Medicaid Other

## 2020-02-23 ENCOUNTER — Other Ambulatory Visit: Payer: Self-pay

## 2020-02-23 ENCOUNTER — Observation Stay (HOSPITAL_COMMUNITY): Payer: Medicaid Other

## 2020-02-23 ENCOUNTER — Emergency Department (HOSPITAL_COMMUNITY): Payer: Medicaid Other

## 2020-02-23 ENCOUNTER — Encounter (HOSPITAL_COMMUNITY): Payer: Self-pay | Admitting: Internal Medicine

## 2020-02-23 DIAGNOSIS — D62 Acute posthemorrhagic anemia: Secondary | ICD-10-CM

## 2020-02-23 DIAGNOSIS — R079 Chest pain, unspecified: Secondary | ICD-10-CM

## 2020-02-23 DIAGNOSIS — K625 Hemorrhage of anus and rectum: Secondary | ICD-10-CM | POA: Diagnosis not present

## 2020-02-23 DIAGNOSIS — R55 Syncope and collapse: Secondary | ICD-10-CM | POA: Diagnosis not present

## 2020-02-23 DIAGNOSIS — D649 Anemia, unspecified: Secondary | ICD-10-CM

## 2020-02-23 DIAGNOSIS — D508 Other iron deficiency anemias: Secondary | ICD-10-CM | POA: Diagnosis not present

## 2020-02-23 LAB — RESP PANEL BY RT-PCR (FLU A&B, COVID) ARPGX2
Influenza A by PCR: NEGATIVE
Influenza B by PCR: NEGATIVE
SARS Coronavirus 2 by RT PCR: NEGATIVE

## 2020-02-23 LAB — BASIC METABOLIC PANEL
Anion gap: 12 (ref 5–15)
BUN: 10 mg/dL (ref 6–20)
CO2: 27 mmol/L (ref 22–32)
Calcium: 9.8 mg/dL (ref 8.9–10.3)
Chloride: 96 mmol/L — ABNORMAL LOW (ref 98–111)
Creatinine, Ser: 0.8 mg/dL (ref 0.61–1.24)
GFR, Estimated: >60 mL/min (ref >60)
Glucose, Bld: 97 mg/dL (ref 70–99)
Potassium: 3.7 mmol/L (ref 3.5–5.1)
Sodium: 135 mmol/L (ref 135–145)

## 2020-02-23 LAB — ECHOCARDIOGRAM COMPLETE
Area-P 1/2: 6.83 cm2
Height: 72 in
S' Lateral: 3.1 cm
Weight: 2432 oz

## 2020-02-23 LAB — TROPONIN I (HIGH SENSITIVITY)
Troponin I (High Sensitivity): 61 ng/L — ABNORMAL HIGH (ref <18)
Troponin I (High Sensitivity): 68 ng/L — ABNORMAL HIGH (ref <18)
Troponin I (High Sensitivity): 58 ng/L — ABNORMAL HIGH (ref <18)

## 2020-02-23 LAB — LIPID PANEL
Cholesterol: 180 mg/dL (ref 0–200)
HDL: 54 mg/dL (ref >40)
LDL Cholesterol: 101 mg/dL — ABNORMAL HIGH (ref 0–99)
Total CHOL/HDL Ratio: 3.3 RATIO
Triglycerides: 123 mg/dL (ref <150)
VLDL: 25 mg/dL (ref 0–40)

## 2020-02-23 LAB — CBC
HCT: 31.8 % — ABNORMAL LOW (ref 39.0–52.0)
Hemoglobin: 10.4 g/dL — ABNORMAL LOW (ref 13.0–17.0)
MCH: 26.8 pg (ref 26.0–34.0)
MCHC: 32.7 g/dL (ref 30.0–36.0)
MCV: 82 fL (ref 80.0–100.0)
Platelets: 301 10*3/uL (ref 150–400)
RBC: 3.88 MIL/uL — ABNORMAL LOW (ref 4.22–5.81)
RDW: 16.7 % — ABNORMAL HIGH (ref 11.5–15.5)
WBC: 5.3 10*3/uL (ref 4.0–10.5)
nRBC: 0.4 % — ABNORMAL HIGH (ref 0.0–0.2)

## 2020-02-23 LAB — MAGNESIUM: Magnesium: 1.8 mg/dL (ref 1.7–2.4)

## 2020-02-23 LAB — HEMOGLOBIN AND HEMATOCRIT, BLOOD
HCT: 29.7 % — ABNORMAL LOW (ref 39.0–52.0)
Hemoglobin: 10.4 g/dL — ABNORMAL LOW (ref 13.0–17.0)

## 2020-02-23 LAB — CREATININE, SERUM
Creatinine, Ser: 0.84 mg/dL (ref 0.61–1.24)
GFR, Estimated: >60 mL/min (ref >60)

## 2020-02-23 LAB — HIV ANTIBODY (ROUTINE TESTING W REFLEX): HIV Screen 4th Generation wRfx: NONREACTIVE

## 2020-02-23 LAB — TSH: TSH: 1.654 u[IU]/mL (ref 0.350–4.500)

## 2020-02-23 MED ORDER — METOPROLOL SUCCINATE ER 25 MG PO TB24
12.5000 mg | ORAL_TABLET | Freq: Every day | ORAL | Status: DC
  Administered 2020-02-23 – 2020-02-27 (×5): 12.5 mg via ORAL
  Filled 2020-02-23 (×5): qty 1

## 2020-02-23 MED ORDER — ENOXAPARIN SODIUM 40 MG/0.4ML ~~LOC~~ SOLN
40.0000 mg | Freq: Every day | SUBCUTANEOUS | Status: DC
  Administered 2020-02-23 – 2020-02-24 (×2): 40 mg via SUBCUTANEOUS
  Filled 2020-02-23 (×2): qty 0.4

## 2020-02-23 MED ORDER — ABIRATERONE ACETATE 250 MG PO TABS
1000.0000 mg | ORAL_TABLET | Freq: Every day | ORAL | Status: DC
  Administered 2020-02-23 – 2020-02-28 (×6): 1000 mg via ORAL
  Filled 2020-02-23 (×6): qty 4

## 2020-02-23 MED ORDER — ASPIRIN 81 MG PO CHEW: 81.0000 mg | CHEWABLE_TABLET | Freq: Every day | ORAL | Status: DC

## 2020-02-23 MED ORDER — ACETAMINOPHEN 325 MG PO TABS: 650.0000 mg | ORAL_TABLET | ORAL | Status: DC | PRN

## 2020-02-23 MED ORDER — PREDNISONE 5 MG PO TABS
5.0000 mg | ORAL_TABLET | Freq: Every day | ORAL | Status: DC
  Administered 2020-02-23 – 2020-02-26 (×4): 5 mg via ORAL
  Filled 2020-02-23 (×6): qty 1

## 2020-02-23 MED ORDER — CLOPIDOGREL BISULFATE 75 MG PO TABS
75.0000 mg | ORAL_TABLET | Freq: Every day | ORAL | Status: DC
  Administered 2020-02-23: 10:00:00 75 mg via ORAL
  Filled 2020-02-23: qty 1

## 2020-02-23 MED ORDER — VENLAFAXINE HCL ER 37.5 MG PO CP24
37.5000 mg | ORAL_CAPSULE | Freq: Every day | ORAL | Status: DC
  Administered 2020-02-23 – 2020-02-28 (×5): 37.5 mg via ORAL
  Filled 2020-02-23 (×6): qty 1

## 2020-02-23 MED ORDER — TAMSULOSIN HCL 0.4 MG PO CAPS
0.4000 mg | ORAL_CAPSULE | Freq: Every day | ORAL | Status: DC
  Administered 2020-02-23 – 2020-02-27 (×5): 0.4 mg via ORAL
  Filled 2020-02-23 (×5): qty 1

## 2020-02-23 MED ORDER — PROCHLORPERAZINE EDISYLATE 10 MG/2ML IJ SOLN
10.0000 mg | Freq: Four times a day (QID) | INTRAMUSCULAR | Status: DC | PRN
  Filled 2020-02-23: qty 2

## 2020-02-23 MED ORDER — PANTOPRAZOLE SODIUM 40 MG IV SOLR
40.0000 mg | Freq: Two times a day (BID) | INTRAVENOUS | Status: DC
  Administered 2020-02-23 – 2020-02-27 (×8): 40 mg via INTRAVENOUS
  Filled 2020-02-23 (×8): qty 40

## 2020-02-23 MED ORDER — ROSUVASTATIN CALCIUM 20 MG PO TABS
40.0000 mg | ORAL_TABLET | Freq: Every day | ORAL | Status: DC
  Administered 2020-02-23 – 2020-02-28 (×6): 40 mg via ORAL
  Filled 2020-02-23 (×3): qty 2
  Filled 2020-02-23: qty 8
  Filled 2020-02-23 (×2): qty 2

## 2020-02-23 NOTE — [place] Notes (Signed)
°  Echocardiogram 2D Echocardiogram has been performed.  Fidel Levy 02/23/2020, 8:50 AM

## 2020-02-23 NOTE — Consult Note (Signed)
Cardiology Consultation:   Patient ID: Daniel Weiss; [id number]; 1960/06/01   Admit date: 02/22/2020 Date of Consult: 02/23/2020  Primary Care Provider: Azzie Glatter, FNP Primary Cardiologist: Dr. Glenetta Hew, MD   Patient Profile:   Daniel Weiss is a 59 y.o. 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 Nevada Crane.   History of Present Illness:   Mr. Achord is a 59yo M with a hx as stated above who presented to [facility]/22/20 with complaints of chest pain and dizziness, similar to his prior MI. He states that the dizziness initially began last week when he would be sitting on the couch and would attempt to walk down the hall to his room and he would get dizzy and black out. This happened on two separate occasions. He reports no LOC and no injury. He also reports that he was having an emotionally charged argument with his sister who he lives with when he began to have anterior chest pain with radiation to his right arm and neck. There was associated SOB. He has also been having issues with bloody stools for "quite some time". He reports that this has recently worsened and he has had several episodes of BRRB. Given his symptoms, his sister called EMS for transport to the ED for further evaluation. He was given SL NTG in route with chest pain relief.   In the ED, his BP was noted to be soft at 109/76, HR 93bpm. HsT found to be elevated at 42>>61>>68>>58. Echocardiogram with akinesis of the distal anteroseptal and apical walls with overall moderate to severe LV dysfunction with an LVEF at 30 to 35%, and G2DD. Hb at 10.4 with  a baseline in the 12.0 range. Fecal occult positive stool sample. GI was consulted with plans for EGD and colonoscopy. He has not had his Plavix since 02/22/20.   He was initially seen by cardiology service after presenting to [facility] 02/2018 with chest pain. He was initially felt to have a STEMI however repeat EKGs with no similar findings. He ultimately underwent LHC that showed single vessel CAD with PCI/DES to mid LAD. He also had residual disease in the ostial Ramus branch at 55%. He's LVEF was at 25-35% with inferior severe hypokinesis. He last underwent coronary CTA 11/03/2018 with patent stent and no other new lesions.   He is followed by Dr. Ellyn Hack for his cardiology care. He was hospitalized 11/03/19-11/12/19 with acute L sided weakness found to have acute CVA, also found to have aortic thrombus on imaging. He was transitioned from Brilinta to Plavix and was started on Coumadin therapy. He was also found to have multiple lytic lesions consistent with metastatic prostate cancer. In multiple cardiology follow ups he was mostly having issues with poor appetite, weight loss and some orthostatic symptoms related to his cancer dx and treatment. He was planned to have a prostate biopsy and cardiology was asked for [facility] clearance at which time plan was to stop coumadin given resolution of mural thrombus on repeat imaging. 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 Plan was for  PET to determine full extent of disease. Preemptive plan is for no palliative radiation as his bone lesions were stable on oral therapy.    Past Medical History:  Diagnosis Date  . Abnormal chest CT 02/2018   a. Ectatic 3 cm Ao arch - rec f/u outpt imaging w/ CTA or MRA. Ectatic atheromatous abd Ao @ risk for aneurysm - rec f/u u/s in 5 yrs. Marked prostatic enlargement w/ scattered small sclerotic foci in the thoracic lower lumbar spine, sacrum, left 12th rib, pelvis, and right femur.   Recommend elective outpatient whole-body bone scintigraphy and PSA.  Marland Kitchen CAD (coronary artery disease)    a. 02/2018 ACS/PCI: LM nl, LAD 85p (3.5x15 Sierra DES), D1 45ost, RI 55ost, RCA nl, EF 25-35%.  . Cardiac murmur   . Complete left bundle branch block (LBBB) 2016  . Dyspnea   . HFrEF (heart failure with reduced ejection fraction) ([place])   . Hypertension   . Ischemic cardiomyopathy 02/2018   a. 02/2018 Echo: EF 35-40% (LV gram on cath was 25-30%), mod, diff HK. mid-apicalanteroseptal, ant, and apical sev HK. RVH.-->  No notable change on echo from June 2020 and August 2020.  Marland Kitchen Myocardial infarction ([place])   . NSTEMI (non-ST elevated myocardial infarction) ([place]) 02/2018   85% pLAD - DES PCI   . Prostate cancer metastatic to multiple sites [facility]) 11/2018  . Stroke ([place])   . TIA (transient ischemic attack) 2016  . Warthin's tumor     Past Surgical History:  Procedure Laterality Date  . CORONARY STENT INTERVENTION N/A 03/06/2018   Procedure: CORONARY STENT INTERVENTION;  Surgeon: [name], MD;  Location: [facility];  Service: Cardiovascular: Proximal LAD 85% stenosis (and D1): DES PCI with Xience Sierra DES 3.5 mm x 15 mm - 3.9 mm  . INTRAOPERATIVE TRANSTHORACIC ECHOCARDIOGRAM  03/06/2018   (Peri-MI) mild reduced EF 35-40%.  Diffuse hypokinesis (severe HK of the mid-apical anteroseptal, anterior and apical myocardium consistent with LAD infarct).  Unable to assess diastolic function.  Paradoxical septal motion likely related to his LBBB.  RV hypertrophy noted.  Trivial pericardial effusion noted.   Marland Kitchen LEFT HEART CATH AND CORONARY ANGIOGRAPHY N/A 03/06/2018   Procedure: LEFT HEART CATH AND CORONARY ANGIOGRAPHY;  Surgeon: [name], MD;  Location: [facility];  Service: Cardiovascular: Proximal LAD 85% involving D1 45%.  Ostial RI 55%.  Severe LV dysfunction EF 25 to 30%.  1+ MR.  Only minimally elevated LVEDP.  Marland Kitchen ORCHIECTOMY Right 01/17/2020   Procedure: ORCHIECTOMY;   Surgeon: Abbie Sons, MD;  Location: [facility];  Service: Urology;  Laterality: Right;  . PROSTATE BIOPSY N/A 01/17/2020   Procedure: BIOPSY TRANSRECTAL ULTRASONIC PROSTATE (TUBP);  Surgeon: Abbie Sons, MD;  Location: [facility];  Service: Urology;  Laterality: N/A;  . TRANSTHORACIC ECHOCARDIOGRAM  10/2018   (Most recent echo, to evaluate for TIA/CVA) : Moderate reduced EF 35 to 40%.  Moderate concentric LVH.  Mild dyskinesis of the apex and severe hypokinesis of mid apical anteroseptal and anterior wall.  Unable to assess RV pressures.  Aortic sclerosis with no stenosis.  No significant change seen     Prior to Admission medications   Medication Sig Start Date End Date Taking? Authorizing Provider  abiraterone acetate (ZYTIGA) 250 MG tablet Take 4 tablets (1,000 mg total) by mouth daily. Take on an empty stomach 1 hour before or 2 hours after a meal 02/10/20   Earlie Server, MD  albuterol (VENTOLIN HFA)  108 (90 Base) MCG/ACT inhaler Inhale 2 puffs into the lungs every 6 (six) hours as needed for wheezing or shortness of breath. 05/23/19   Azzie Glatter, FNP  clopidogrel (PLAVIX) 75 MG tablet Take 1 tablet (75 mg total) by mouth daily. 11/28/19   [name], MD  furosemide (LASIX) 20 MG tablet May take 20 mg tablet as needed for increase shortness of breath or swelling Patient taking differently: Take 20 mg by mouth daily as needed for edema (shortness of breath). 05/30/19   [name], MD  metoprolol succinate (TOPROL-XL) 25 MG 24 hr tablet Take 1 tablet (25 mg total) by mouth daily with supper. 11/28/19   [name], MD  nitroGLYCERIN (NITROSTAT) 0.4 MG SL tablet Place 1 tablet (0.4 mg total) under the tongue every 5 (five) minutes as needed for chest pain. Patient not taking: No sig reported 03/10/18   Theora Gianotti, NP  ondansetron (ZOFRAN) 4 MG tablet Take 4 mg by mouth every 4 (four) hours as needed for nausea or vomiting.  10/16/19   [provider]   oxyCODONE-acetaminophen (PERCOCET/ROXICET) 5-325 MG tablet Take 1 tablet by mouth every 6 (six) hours as needed for severe pain. Patient not taking: No sig reported 01/17/20   Abbie Sons, MD  predniSONE (DELTASONE) 5 MG tablet Take 1 tablet (5 mg total) by mouth daily with breakfast. 02/10/20   Earlie Server, MD  rosuvastatin (CRESTOR) 40 MG tablet Take 1 tablet by mouth once daily Patient taking differently: Take 40 mg by mouth daily. 10/17/19   [name], MD  sacubitril-valsartan (ENTRESTO) 24-26 MG Take 1/2 tablet in the morning and 1 tablet in the evening Patient taking differently: Take 0.5 tablets by mouth 2 (two) times daily. 05/30/19   [name], MD  tamsulosin (FLOMAX) 0.4 MG CAPS capsule Take 1 capsule (0.4 mg total) by mouth daily after supper. 03/25/19   [name], MD  venlafaxine XR (EFFEXOR XR) 37.5 MG 24 hr capsule Take 1 capsule (37.5 mg total) by mouth daily with breakfast. 10/26/19   Stoioff, Ronda Fairly, MD    Inpatient Medications: Scheduled Meds: . abiraterone acetate  1,000 mg Oral Daily  . clopidogrel  75 mg Oral Daily  . enoxaparin (LOVENOX) injection  40 mg Subcutaneous Daily  . metoprolol succinate  12.5 mg Oral Q supper  . predniSONE  5 mg Oral Q breakfast  . rosuvastatin  40 mg Oral Daily  . tamsulosin  0.4 mg Oral QPC supper  . venlafaxine XR  37.5 mg Oral Q breakfast   Continuous Infusions:  PRN Meds: acetaminophen, prochlorperazine  Allergies:   No Known Allergies  Social History:   Social History   Socioeconomic History  . Marital status: Legally Separated    Spouse name: Dorothenia  . Number of children: Not on file  . Years of education: Not on file  . Highest education level: Not on file  Occupational History  . Occupation: cook  Tobacco Use  . Smoking status: Current Some Day Smoker    Packs/day: 0.00    Years: 0.00    Pack years: 0.00    Types: Cigarettes  . Smokeless tobacco: Never Used  . Tobacco comment: quit three  days ago  Vaping Use  . Vaping Use: Never used  Substance and Sexual Activity  . Alcohol use: Yes    Alcohol/week: 2.0 standard drinks    Types: 2 Cans of beer per week    Comment: 2  Beers daily.  . Drug use: Yes    Frequency: 3.0 times per week    Types: Marijuana  . Sexual activity: Not Currently  Other Topics Concern  . Not on file  Social History Narrative  . Not on file   Social Determinants of Health   Financial Resource Strain: Not on file  Food Insecurity: Not on file  Transportation Needs: Not on file  Physical Activity: Not on file  Stress: Not on file  Social Connections: Not on file  Intimate Partner Violence: Not on file    Family History:   Family History  Problem Relation Age of Onset  . Stroke Mother   . Hypertension Mother   . Diabetes type II Mother   . Leukemia Other   . Breast cancer Other   . Stroke Brother   . CAD Neg Hx    Family Status:  Family Status  Relation Name Status  . Mother  Deceased  . Father  Deceased  . Other  (Not Specified)  . Sister  Alive  . Brother  Alive  . MGM  Deceased  . MGF  Deceased  . PGM  Deceased  . Sister  Alive  . Sister  Alive  . Brother  Alive  . Neg Hx  (Not Specified)    ROS:  Please see the history of present illness.  All other ROS reviewed and negative.     Physical Exam/Data:   Vitals:   02/23/20 0400 02/23/20 0615 02/23/20 0630 02/23/20 0745  BP: 109/83 105/76 105/81 113/82  Pulse: 92 93 85 90  Resp: 16 17 14 15   Temp:      TempSrc:      SpO2: 96% 95% 98% 100%  Weight:      Height:       No intake or output data in the 24 hours ending 02/23/20 1037 Filed Weights   02/22/20 2126  Weight: 68.9 kg   Body mass index is 20.61 kg/m.   General: Frail, NAD Skin: Warm, dry, intact  Neck: Negative for carotid bruits. No JVD Lungs:Clear to ausculation bilaterally. No wheezes, rales, or rhonchi. Breathing is unlabored. Cardiovascular: RRR with S1 S2. No murmurs Abdomen: Soft,  non-tender, non-distended. No obvious abdominal masses. Extremities: No edema. Radial pulses 2+ bilaterally Neuro: Alert and oriented. No focal deficits. No facial asymmetry. MAE spontaneously. Psych: Responds to questions appropriately with normal affect.    EKG:  The EKG was personally reviewed and demonstrates:  02/23/20 NSR with LBBB (known) HR 86bpm with no significant change from prior tracing in 07/2019 Telemetry:  Telemetry was personally reviewed and demonstrates:  02/23/20 NSR with HR 90's   Relevant CV Studies:  Echocardiogram 02/23/20:  1. Akinesis of the distal anteroseptal and apical walls with overall  moderate to severe LV dysfunction.  2. Left ventricular ejection fraction, by estimation, is 30 to 35%. The  left ventricle has moderate to severely decreased function. The left  ventricle demonstrates regional wall motion abnormalities (see scoring  diagram/findings for description). Left  ventricular diastolic parameters are consistent with Grade II diastolic  dysfunction (pseudonormalization).  3. Right ventricular systolic function is normal. The right ventricular  size is normal.  4. The mitral valve is normal in structure. No evidence of mitral valve  regurgitation. No evidence of mitral stenosis.  5. The aortic valve has an indeterminant number of cusps. Aortic valve  regurgitation is trivial. No aortic stenosis is present.  6. The inferior vena cava is normal in size  with greater than 50%  respiratory variability, suggesting right atrial pressure of 3 mmHg.    Echocardiogram 11/04/2018 1. Mild dyskinesis of the left ventricular, entire apical segment. 2. Severe hypokinesis of the left ventricular, mid-apical anteroseptal wall and anterior wall. 3. The left ventricle has moderately reduced systolic function, with an ejection fraction of 35-40%. The cavity size was normal. There is moderate concentric left ventricular hypertrophy. Left ventricular diastolic  Doppler parameters are consistent with impaired relaxation. There is abnormal septal motion consistent with left bundle branch block. No evidence of left ventricular regional wall motion abnormalities. 4. The right ventricle has normal systolic function. The cavity was normal. There is no increase in right ventricular wall thickness. Right ventricular systolic pressure could not be assessed. 5. Sclerosis without any evidence of stenosis of the aortic valve. Aortic valve regurgitation is trivial by color flow Doppler. 6. The aorta is normal unless otherwise noted. 7. The aortic root is normal in size and structure. 8. When compared to the prior study: 08/16/2018, no significant change is seen.  Laboratory Data:  Chemistry Recent Labs  Lab 02/22/20 2137 02/23/20 0251 02/23/20 0413  NA 133*  --  135  K 4.2  --  3.7  CL 96*  --  96*  CO2 27  --  27  GLUCOSE 118*  --  97  BUN 9  --  10  CREATININE 1.02 0.84 0.80  CALCIUM 9.9  --  9.8  GFRNONAA >60 >60 >60  ANIONGAP 10  --  12    Total Protein  Date Value Ref Range Status  02/10/2020 7.1 6.5 - 8.1 g/dL Final  06/07/2019 7.0 6.0 - 8.5 g/dL Final   Albumin  Date Value Ref Range Status  02/10/2020 3.7 3.5 - 5.0 g/dL Final  06/07/2019 4.2 3.8 - 4.9 g/dL Final   AST  Date Value Ref Range Status  02/10/2020 64 (H) 15 - 41 U/L Final   ALT  Date Value Ref Range Status  02/10/2020 39 0 - 44 U/L Final   Alkaline Phosphatase  Date Value Ref Range Status  02/10/2020 53 38 - 126 U/L Final   Total Bilirubin  Date Value Ref Range Status  02/10/2020 0.6 0.3 - 1.2 mg/dL Final   Bilirubin Total  Date Value Ref Range Status  06/07/2019 0.2 0.0 - 1.2 mg/dL Final   Hematology Recent Labs  Lab 02/22/20 2137 02/23/20 0251  WBC 6.8 5.3  RBC 4.02* 3.88*  HGB 10.6* 10.4*  HCT 33.2* 31.8*  MCV 82.6 82.0  MCH 26.4 26.8  MCHC 31.9 32.7  RDW 16.6* 16.7*  PLT 319 301   Cardiac EnzymesNo results for input(s): TROPONINI in the  last 168 hours. No results for input(s): TROPIPOC in the last 168 hours.  BNPNo results for input(s): BNP, PROBNP in the last 168 hours.  DDimer No results for input(s): DDIMER in the last 168 hours. TSH:  Lab Results  Component Value Date   TSH 1.654 02/23/2020   Lipids: Lab Results  Component Value Date   CHOL 180 02/23/2020   HDL 54 02/23/2020   LDLCALC 101 (H) 02/23/2020   TRIG 123 02/23/2020   CHOLHDL 3.3 02/23/2020   HgbA1c: Lab Results  Component Value Date   HGBA1C 5.2 05/23/2019    Radiology/Studies:  DG Chest 2 View  Result Date: 02/22/2020 CLINICAL DATA:  Chest pain EXAM: CHEST - 2 VIEW COMPARISON:  11/03/2018 FINDINGS: Heart and mediastinal contours are within normal limits. No focal opacities or effusions. No  acute bony abnormality. IMPRESSION: No active cardiopulmonary disease. Electronically Signed   By: Rolm Baptise M.D.   On: 02/22/2020 21:50   CT HEAD WO CONTRAST  Result Date: 02/23/2020 CLINICAL DATA:  Right arm numbness. EXAM: CT HEAD WITHOUT CONTRAST TECHNIQUE: Contiguous axial images were obtained from the base of the skull through the vertex without intravenous contrast. COMPARISON:  11/03/2018 FINDINGS: Brain: No acute intracranial abnormality. Specifically, no hemorrhage, hydrocephalus, mass lesion, acute infarction, or significant intracranial injury. Vascular: No hyperdense vessel or unexpected calcification. Skull: No acute calvarial abnormality. Sinuses/Orbits: Visualized paranasal sinuses and mastoids clear. Orbital soft tissues unremarkable. Other: None IMPRESSION: Normal study. Electronically Signed   By: Rolm Baptise M.D.   On: 02/23/2020 00:44   VAS US CAROTID  Result Date: 02/23/2020 Carotid Arterial Duplex Study Indications:       Syncope. Risk Factors:      Hypertension, hyperlipidemia, current smoker, coronary artery                    disease. Other Factors:     Prostate cancer, ischemic cardiomyopathy, Warthin's tumor,                     right neck. Comparison Study:  Prior study indicating 1-39% ICA stenosis, from 07/07/14, is                    available for comparison Performing Technologist: Sharion Dove RVS  Examination Guidelines: A complete evaluation includes B-mode imaging, spectral Doppler, color Doppler, and power Doppler as needed of all accessible portions of each vessel. Bilateral testing is considered an integral part of a complete examination. Limited examinations for reoccurring indications may be performed as noted.  Right Carotid Findings: +----------+--------+--------+--------+------------------+--------+           PSV cm/sEDV cm/sStenosisPlaque DescriptionComments +----------+--------+--------+--------+------------------+--------+ CCA Prox  122     29                                         +----------+--------+--------+--------+------------------+--------+ CCA Distal110     29                                         +----------+--------+--------+--------+------------------+--------+ ICA Prox  45      16                                         +----------+--------+--------+--------+------------------+--------+ ICA Distal65      29                                         +----------+--------+--------+--------+------------------+--------+ ECA       50      10                                         +----------+--------+--------+--------+------------------+--------+ +----------+--------+-------+--------+-------------------+           PSV cm/sEDV cmsDescribeArm Pressure (mmHG) +----------+--------+-------+--------+-------------------+ [id number]                                         +----------+--------+-------+--------+-------------------+ +---------+--------+--+--------+--+  VertebralPSV cm/s82EDV cm/s29 +---------+--------+--+--------+--+  Left Carotid Findings: +----------+--------+--------+--------+------------------+--------+           PSV cm/sEDV  cm/sStenosisPlaque DescriptionComments +----------+--------+--------+--------+------------------+--------+ CCA Prox  133     36                                         +----------+--------+--------+--------+------------------+--------+ CCA Distal72      23                                         +----------+--------+--------+--------+------------------+--------+ ICA Prox  59      28                                         +----------+--------+--------+--------+------------------+--------+ ICA Distal95      38                                         +----------+--------+--------+--------+------------------+--------+ ECA       62      17                                         +----------+--------+--------+--------+------------------+--------+ +----------+--------+--------+--------+-------------------+           PSV cm/sEDV cm/sDescribeArm Pressure (mmHG) +----------+--------+--------+--------+-------------------+ [id number]                                          +----------+--------+--------+--------+-------------------+ +---------+--------+--+--------+-+ VertebralPSV cm/s13EDV cm/s4 +---------+--------+--+--------+-+     Preliminary     Assessment and Plan:   1. Atypical chest pain with known CAD and piror PCI/DES to LAD 02/2018: -Chest pain initially began after an argument with his sister yesterday afternoon. Given the culmination of symptoms including bloody stools and dizziness, his sister called EMS for further assistance.  There have been no exertional qualities to his chest pain and no recurrence. He was given SL NTG by EMS with relief -HsT found to be elevated at 42>>61>>68>>58 with no acute change on EKG -Echocardiogram with akinesis of the distal anteroseptal and apical walls with overall moderate to severe LV dysfunction with an LVEF at 30 to 35%, and G2DD.  -Most recent cardiac evaluation 10/2019 with coronary CTA with stable  non-obstructive disease and patent LAD stent  -Hb at 10.4 with a baseline in the 12.0 range.  -Fecal occult positive stool sample at which tim GI was consulted with plans for EGD and colonoscopy. Last Plavix dose 02/23/20.   -Given the above issues, along with a stable EKG, flat trending HsT consistent with demand ischemia, active bleeding with unknown source and no chest pain recurrence, we will optimize him medically for now and follow closely with no plans for ischemic workup at this time.  -Continue current regimen including statin, Toprol  -Trop elevation consistent with demand ischemia in the setting of blood loss anemia   2. Dizziness: -He has had approximately a week long hx of dizziness with two falls. Dizziness typically  occurs when moving from the sitting to standing/walking positions. He states that his symptoms do not occur with chest pain or palpitations or with sitting. We will continue to monitor on telemetry. Primary team has ordered carotid dopplers however symptoms sound very consistent with orthostatic hypotension -Obtain orthostatic vitals -He reports poor oral intake>>mixed with blood loss anemia -Would plan for LE compression stocking at discharge.   5. GI bleed: -Reports BRBPR for "quite some time" with more recent bloody stool and dizziness. On hospital presentation, fecal occult positive with Hb at 10.4 with a baseline at 12.0. He was seen by GI with plans for EGD and colonoscopy to identify source of bleeding.  -Last Plavix dose 02/23/20 per chart review.  4. Ischemic cardiomyopathy: -Echocardiogram with akinesis of the distal anteroseptal and apical walls with overall moderate to severe LV dysfunction with an LVEF at 30 to 35%, and G2DD. Hb at 10.4 with a baseline in the 12.0 range. Fecal occult positive stool sample. GI was consulted with plans for EGD and colonoscopy. He has not had his Plavix since 02/22/20.  -He has been treated with Delene Loll 24/26 dosing along with  PRN Lasix -Restart Entresto once hemodynamically stable from a GI standpoint  -Appears euvolemic on exam   5. Hx of aortic thrombus: -Dx in 2020 initially treated with Coumadin however this was transitioned to Plavix after resolution of thrombus per CTA 10/2019.  -On PTA Plavix>>held?   6. HTN: -Stable, 166/90>>123/85>>127/85 -Continue Toprol 12.5 -Would add Entresto 24/26 back to regimen if BP remains elevated    7. HLD: -Last LDL, 101 on 02/23/20 -Continue statin    For questions or updates, please contact [place] Please consult [email or web address] for contact info under Cardiology/STEMI.   SignedKathyrn Drown NP-C [facility] Pager: [phone] 02/23/2020 10:37 AM

## 2020-02-23 NOTE — ED Notes (Signed)
MD paged for diet order

## 2020-02-23 NOTE — Progress Notes (Signed)
Occupational Therapy Evaluation Patient Details Name: Daniel Weiss MRN: [id number] DOB: [birth year] Today's Date: 02/23/2020    History of Present Illness Pt is a 59 y/o male admitted secondary to syncopal episodes with acute blood loss anemia and chest pain. Workup pending. PMH includes prostate CA, CAD,and CHF.   Clinical Impression   PTA pt lives with his sister and is independent with ADL and mobility with occasional use of a RW. Pt "furniture walks" in the house. Pt states he has had 4 falls in the last month and 2 of those in the last week. Pt states "I get dizzy and pass out". Dizziness with standing however pt does not appear orthostatic (BP supine 149/110; sitting 137/97; standing 144/95).  Pt states he gets easily fatigued with ADL/IADL tasks and is only able to walk short distances before becoming SOB. States this has "gotten worse" recently. Minimal mobility this date due to complaints of dizziness and HR increasing from 105 to 138 with standing. Will follow acutely to facilitate safe DC home. Would benefit from [facility] if available. Recommend shower seat. Pt appreciative.     Follow Up Recommendations  Supervision - Intermittent;Home health OT (pending progress)    Equipment Recommendations  Tub/shower seat    Recommendations for Other Services       Precautions / Restrictions Precautions Precautions: Fall Precaution Comments: 2 falls within the past week, 4 within the past month Restrictions Weight Bearing Restrictions: No      Mobility Bed Mobility Overal bed mobility: Needs Assistance Bed Mobility: Supine to Sit;Sit to Supine     Supine to sit: Supervision Sit to supine: Supervision   General bed mobility comments: Supervision for safety and line management.    Transfers Overall transfer level: Needs assistance Equipment used: Rolling walker (2 wheeled) Transfers: Sit to/from Stand Sit to Stand: Min guard;+2 safety/equipment         General transfer  comment: Pt with increased lightheadedness/dizziness and elevated HR up to high 130s. Pt unable to tolerate further mobility    Balance Overall balance assessment: Needs assistance Sitting-balance support: No upper extremity supported Sitting balance-Leahy Scale: Good     Standing balance support: Bilateral upper extremity supported Standing balance-Leahy Scale: Poor Standing balance comment: Reliant on BUE support                           ADL either performed or assessed with clinical judgement   ADL Overall ADL's : Needs assistance/impaired Eating/Feeding: Modified independent   Grooming: Set up;Sitting   Upper Body Bathing: Set up;Sitting   Lower Body Bathing: Sit to/from stand;Min guard   Upper Body Dressing : Set up;Sitting   Lower Body Dressing: Min guard;Sit to/from stand   Toilet Transfer: Min guard;+2 for safety/equipment (steady assist)           Functional mobility during ADLs: Min guard;Rolling walker;Cueing for safety General ADL Comments: ADL tasks have become more difficult due to increased SOB; fatigues easily     Vision         Perception     Praxis      Pertinent Vitals/Pain Pain Assessment: No/denies pain     Hand Dominance Right   Extremity/Trunk Assessment Upper Extremity Assessment Upper Extremity Assessment: RUE deficits/detail RUE Deficits / Details: strength WFL; complains of new onset of numbness/tingling in R hand RUE Coordination: decreased fine motor (has been dropping items)   Lower Extremity Assessment Lower Extremity Assessment: Defer to PT evaluation  Cervical / Trunk Assessment Cervical / Trunk Assessment: Normal   Communication Communication Communication: No difficulties   Cognition Arousal/Alertness: Awake/alert Behavior During Therapy: WFL for tasks assessed/performed Overall Cognitive Status: Within Functional Limits for tasks assessed                                     General  Comments  See vitals flowsheet for orthostatics; BP WFL for position changes.    Exercises     Shoulder Instructions      Home Living Family/patient expects to be discharged to:: Private residence Living Arrangements: Other relatives Available Help at Discharge: Available 24 hours/day Type of Home: Apartment Home Access: Level entry     Home Layout: One level     Bathroom Shower/Tub: Tub/shower unit;Curtain   Biochemist, clinical: Handicapped height Bathroom Accessibility: Yes How Accessible: Accessible via wheelchair Home Equipment: Walker - 2 wheels;Grab bars - toilet;Grab bars - tub/shower          Prior Functioning/Environment Level of Independence: Independent with assistive device(s)        Comments: occasionally uses RW; furniture/wall surfing; 4 falls in the past month; 2 within last week        OT Problem List: Decreased strength;Decreased activity tolerance;Impaired balance (sitting and/or standing);Decreased coordination;Decreased safety awareness;Decreased knowledge of use of DME or AE;Cardiopulmonary status limiting activity;Impaired sensation;Impaired UE functional use;Pain      OT Treatment/Interventions: Self-care/ADL training;Therapeutic exercise;Energy conservation;DME and/or AE instruction;Therapeutic activities;Patient/family education;Balance training    OT Goals(Current goals can be found in the care plan section) Acute Rehab OT Goals Patient Stated Goal: "to get back to doing what I used to do" OT Goal Formulation: With patient Time For Goal Achievement: 03/08/20 Potential to Achieve Goals: Good ADL Goals Pt Will Perform Lower Body Bathing: with modified independence;sit to/from stand Pt Will Perform Lower Body Dressing: with modified independence;sit to/from stand Pt Will Transfer to Toilet: with modified independence;ambulating Pt Will Perform Toileting - Clothing Manipulation and hygiene: with modified independence;sit to/from stand Additional  ADL Goal #1: P will verbalize 3 strategies to reduce risk of falls Additional ADL Goal #2: Pt will independently verbalize 3 energy conservation strategies  OT Frequency: Min 2X/week   Barriers to D/C:            Co-evaluation PT/OT/SLP Co-Evaluation/Treatment: Yes Reason for Co-Treatment: For patient/therapist safety;To address functional/ADL transfers PT goals addressed during session: Mobility/safety with mobility;Balance OT goals addressed during session: ADL's and self-care      AM-PAC OT "6 Clicks" Daily Activity     Outcome Measure Help from another person eating meals?: None Help from another person taking care of personal grooming?: A Little Help from another person toileting, which includes using toliet, bedpan, or urinal?: A Little Help from another person bathing (including washing, rinsing, drying)?: A Little Help from another person to put on and taking off regular upper body clothing?: A Little Help from another person to put on and taking off regular lower body clothing?: A Little 6 Click Score: 19   End of Session Equipment Utilized During Treatment: Gait belt;Rolling walker Nurse Communication: Mobility status  Activity Tolerance: Other (comment) (limited by dizziness but tolerated well) Patient left: in bed;with call bell/phone within reach  OT Visit Diagnosis: Unsteadiness on feet (R26.81);Other abnormalities of gait and mobility (R26.89);Repeated falls (R29.6);Muscle weakness (generalized) (M62.81);Dizziness and giddiness (R42)  Time: 9672-2773 OT Time Calculation (min): 18 min Charges:  OT General Charges $OT Visit: 1 Visit OT Evaluation $OT Eval Moderate Complexity: [place], OT/L   Acute OT Clinical Specialist Acute Rehabilitation Services Pager [phone] Office [phone]   [facility] 02/23/2020, 3:34 PM

## 2020-02-23 NOTE — Progress Notes (Signed)
59 year old gentleman prior history of chronic systolic and diastolic heart failure with left ventricular ejection fraction of 35%, coronary artery disease s/p PCI to LAD/2019, ischemic cardiomyopathy, CVA, hypertension, hyperlipidemia, metastatic prostate cancer presents to ED for chest pain and lightheadedness. He was found to have rectal bleeding and guaiac positive stools.  His EKG does not show any ischemic changes.  troponins are mildly elevated. Currently patient is chest pain free.  Cardiology consulted to see if he needs further evaluation.  GI consulted for rectal bleeding. PT reports that he did not have any work up done in the past.   Pt seenand examined, appears comfortable and not in any distress.   Plan:  IV PPI BID, cbc in am and check H&H now. Transfuse to keep hemoglobin greater than 7.  Hold plavix.    Hosie Poisson, MD

## 2020-02-23 NOTE — H&P (View-Only) (Signed)
Consultation  Referring Provider: Dr. Karleen Hampshire    Primary Care Physician:  Azzie Glatter, FNP Primary Gastroenterologist: Althia Forts       Reason for Consultation: Anemia, GI bleed            HPI:   Daniel Weiss is a 59 y.o. male with a past medical history significant for prostate cancer on Zytiga, CAD, chronic systolic CHF with an EF of 35%, who presented to the ED on 02/22/2020 with a complaint of nonexertional chest pain similar to chest pain from prior MI with sudden onset, initially started with right arm numbness.  Chest pain was relieved by nitroglycerin.  We are consulted in regards to report of hematochezia.    Today, the patient explains that he presented to the ER initially because he was having chest pain.  Tells me it felt similar to when he had heart attack.  Since he has been in the ER the pain is better.  Also describes that on Sunday he became somewhat nauseous when he woke up in the morning and went to the bathroom he passed some bright red blood mixed in with his stool, "it covered all of the toilet paper".  He then went back to try and lay down and instead had to vomit.  He saw a little bit of bright red blood in his vomitus and then proceeded to have black bowel movements about 2 a day with the last one being on Monday.  Since then he has had some normal brown stools over the past couple of days.  Denies any abdominal pain or further nausea or vomiting.  Associated symptoms include intermittent dizziness and passing out.    Patient is on Plavix and had his list last dose yesterday morning, 02/22/2020.    Denies any previous GI procedures including colonoscopy or endoscopy.    Denies fever, chills, weight loss, heartburn, reflux or symptoms that awaken him from sleep.  ED course: BP 109/76, heart rate 93, respiratory rate 16, lab studies remarkable for troponin of 61-from 42 on presentation, sodium 133, creatinine 1.02, hemoglobin 10.6 (baseline of around 12), sinus  tachycardia with nonspecific ST-T changes  Past Medical History:  Diagnosis Date  . Abnormal chest CT 02/2018   a. Ectatic 3 cm Ao arch - rec f/u outpt imaging w/ CTA or MRA. Ectatic atheromatous abd Ao @ risk for aneurysm - rec f/u u/s in 5 yrs. Marked prostatic enlargement w/ scattered small sclerotic foci in the thoracic lower lumbar spine, sacrum, left 12th rib, pelvis, and right femur.  Recommend elective outpatient whole-body bone scintigraphy and PSA.  Marland Kitchen CAD (coronary artery disease)    a. 02/2018 ACS/PCI: LM nl, LAD 85p (3.5x15 Sierra DES), D1 45ost, RI 55ost, RCA nl, EF 25-35%.  . Cardiac murmur   . Complete left bundle branch block (LBBB) 2016  . Dyspnea   . HFrEF (heart failure with reduced ejection fraction) ([place])   . Hypertension   . Ischemic cardiomyopathy 02/2018   a. 02/2018 Echo: EF 35-40% (LV gram on cath was 25-30%), mod, diff HK. mid-apicalanteroseptal, ant, and apical sev HK. RVH.-->  No notable change on echo from June 2020 and August 2020.  Marland Kitchen Myocardial infarction ([place])   . NSTEMI (non-ST elevated myocardial infarction) ([place]) 02/2018   85% pLAD - DES PCI   . Prostate cancer metastatic to multiple sites [facility]) 11/2018  . Stroke ([place])   . TIA (transient ischemic attack) 2016  . Warthin's tumor  Past Surgical History:  Procedure Laterality Date  . CORONARY STENT INTERVENTION N/A 03/06/2018   Procedure: CORONARY STENT INTERVENTION;  Surgeon: [name], MD;  Location: [facility];  Service: Cardiovascular: Proximal LAD 85% stenosis (and D1): DES PCI with Xience Sierra DES 3.5 mm x 15 mm - 3.9 mm  . INTRAOPERATIVE TRANSTHORACIC ECHOCARDIOGRAM  03/06/2018   (Peri-MI) mild reduced EF 35-40%.  Diffuse hypokinesis (severe HK of the mid-apical anteroseptal, anterior and apical myocardium consistent with LAD infarct).  Unable to assess diastolic function.  Paradoxical septal motion likely related to his LBBB.  RV hypertrophy noted.  Trivial pericardial effusion  noted.   Marland Kitchen LEFT HEART CATH AND CORONARY ANGIOGRAPHY N/A 03/06/2018   Procedure: LEFT HEART CATH AND CORONARY ANGIOGRAPHY;  Surgeon: [name], MD;  Location: [facility];  Service: Cardiovascular: Proximal LAD 85% involving D1 45%.  Ostial RI 55%.  Severe LV dysfunction EF 25 to 30%.  1+ MR.  Only minimally elevated LVEDP.  Marland Kitchen ORCHIECTOMY Right 01/17/2020   Procedure: ORCHIECTOMY;  Surgeon: Abbie Sons, MD;  Location: [facility];  Service: Urology;  Laterality: Right;  . PROSTATE BIOPSY N/A 01/17/2020   Procedure: BIOPSY TRANSRECTAL ULTRASONIC PROSTATE (TUBP);  Surgeon: Abbie Sons, MD;  Location: [facility];  Service: Urology;  Laterality: N/A;  . TRANSTHORACIC ECHOCARDIOGRAM  10/2018   (Most recent echo, to evaluate for TIA/CVA) : Moderate reduced EF 35 to 40%.  Moderate concentric LVH.  Mild dyskinesis of the apex and severe hypokinesis of mid apical anteroseptal and anterior wall.  Unable to assess RV pressures.  Aortic sclerosis with no stenosis.  No significant change seen    Family History  Problem Relation Age of Onset  . Stroke Mother   . Hypertension Mother   . Diabetes type II Mother   . Leukemia Other   . Breast cancer Other   . Stroke Brother   . CAD Neg Hx     Social History   Tobacco Use  . Smoking status: Current Some Day Smoker    Packs/day: 0.00    Years: 0.00    Pack years: 0.00    Types: Cigarettes  . Smokeless tobacco: Never Used  . Tobacco comment: quit three days ago  Vaping Use  . Vaping Use: Never used  Substance Use Topics  . Alcohol use: Yes    Alcohol/week: 2.0 standard drinks    Types: 2 Cans of beer per week    Comment: 2 Beers daily.  . Drug use: Yes    Frequency: 3.0 times per week    Types: Marijuana    Prior to Admission medications   Medication Sig Start Date End Date Taking? Authorizing Provider  abiraterone acetate (ZYTIGA) 250 MG tablet Take 4 tablets (1,000 mg total) by mouth daily. Take on an empty stomach 1 hour  before or 2 hours after a meal 02/10/20   Earlie Server, MD  albuterol (VENTOLIN HFA) 108 (90 Base) MCG/ACT inhaler Inhale 2 puffs into the lungs every 6 (six) hours as needed for wheezing or shortness of breath. 05/23/19   Azzie Glatter, FNP  clopidogrel (PLAVIX) 75 MG tablet Take 1 tablet (75 mg total) by mouth daily. 11/28/19   [name], MD  furosemide (LASIX) 20 MG tablet May take 20 mg tablet as needed for increase shortness of breath or swelling Patient taking differently: Take 20 mg by mouth daily as needed for edema (shortness of breath). 05/30/19   Glenetta Hew  W, MD  metoprolol succinate (TOPROL-XL) 25 MG 24 hr tablet Take 1 tablet (25 mg total) by mouth daily with supper. 11/28/19   [name], MD  nitroGLYCERIN (NITROSTAT) 0.4 MG SL tablet Place 1 tablet (0.4 mg total) under the tongue every 5 (five) minutes as needed for chest pain. Patient not taking: No sig reported 03/10/18   Theora Gianotti, NP  ondansetron (ZOFRAN) 4 MG tablet Take 4 mg by mouth every 4 (four) hours as needed for nausea or vomiting.  10/16/19   [provider]  oxyCODONE-acetaminophen (PERCOCET/ROXICET) 5-325 MG tablet Take 1 tablet by mouth every 6 (six) hours as needed for severe pain. Patient not taking: No sig reported 01/17/20   Abbie Sons, MD  predniSONE (DELTASONE) 5 MG tablet Take 1 tablet (5 mg total) by mouth daily with breakfast. 02/10/20   Earlie Server, MD  rosuvastatin (CRESTOR) 40 MG tablet Take 1 tablet by mouth once daily Patient taking differently: Take 40 mg by mouth daily. 10/17/19   [name], MD  sacubitril-valsartan (ENTRESTO) 24-26 MG Take 1/2 tablet in the morning and 1 tablet in the evening Patient taking differently: Take 0.5 tablets by mouth 2 (two) times daily. 05/30/19   [name], MD  tamsulosin (FLOMAX) 0.4 MG CAPS capsule Take 1 capsule (0.4 mg total) by mouth daily after supper. 03/25/19   [name], MD  venlafaxine XR (EFFEXOR XR) 37.5 MG  24 hr capsule Take 1 capsule (37.5 mg total) by mouth daily with breakfast. 10/26/19   Stoioff, Ronda Fairly, MD    Current Facility-Administered Medications  Medication Dose Route Frequency Provider Last Rate Last Admin  . abiraterone acetate (ZYTIGA) tablet 1,000 mg  1,000 mg Oral Daily Irene Pap N, DO      . acetaminophen (TYLENOL) tablet 650 mg  650 mg Oral Q4H PRN Irene Pap N, DO      . clopidogrel (PLAVIX) tablet 75 mg  75 mg Oral Daily Irene Pap N, DO   75 mg at 02/23/20 1026  . enoxaparin (LOVENOX) injection 40 mg  40 mg Subcutaneous Daily Irene Pap N, DO   40 mg at 02/23/20 1027  . metoprolol succinate (TOPROL-XL) 24 hr tablet 12.5 mg  12.5 mg Oral Q supper Hall, Carole N, DO      . predniSONE (DELTASONE) tablet 5 mg  5 mg Oral Q breakfast Gerty, Carole N, DO   5 mg at 02/23/20 1026  . prochlorperazine (COMPAZINE) injection 10 mg  10 mg Intravenous Q6H PRN Irene Pap N, DO      . rosuvastatin (CRESTOR) tablet 40 mg  40 mg Oral Daily Irene Pap N, DO   40 mg at 02/23/20 1028  . tamsulosin (FLOMAX) capsule 0.4 mg  0.4 mg Oral QPC supper Irene Pap N, DO      . venlafaxine XR (EFFEXOR-XR) 24 hr capsule 37.5 mg  37.5 mg Oral Q breakfast Irene Pap N, DO   37.5 mg at 02/23/20 1026   Current Outpatient Medications  Medication Sig Dispense Refill  . abiraterone acetate (ZYTIGA) 250 MG tablet Take 4 tablets (1,000 mg total) by mouth daily. Take on an empty stomach 1 hour before or 2 hours after a meal 120 tablet 0  . albuterol (VENTOLIN HFA) 108 (90 Base) MCG/ACT inhaler Inhale 2 puffs into the lungs every 6 (six) hours as needed for wheezing or shortness of breath. 8 g 11  . clopidogrel (PLAVIX) 75 MG tablet Take 1 tablet (75  mg total) by mouth daily. 90 tablet 3  . furosemide (LASIX) 20 MG tablet May take 20 mg tablet as needed for increase shortness of breath or swelling (Patient taking differently: Take 20 mg by mouth daily as needed for edema (shortness of breath).) 20 tablet  6  . metoprolol succinate (TOPROL-XL) 25 MG 24 hr tablet Take 1 tablet (25 mg total) by mouth daily with supper. 45 tablet 1  . nitroGLYCERIN (NITROSTAT) 0.4 MG SL tablet Place 1 tablet (0.4 mg total) under the tongue every 5 (five) minutes as needed for chest pain. (Patient not taking: No sig reported) 25 tablet 3  . ondansetron (ZOFRAN) 4 MG tablet Take 4 mg by mouth every 4 (four) hours as needed for nausea or vomiting.     Marland Kitchen oxyCODONE-acetaminophen (PERCOCET/ROXICET) 5-325 MG tablet Take 1 tablet by mouth every 6 (six) hours as needed for severe pain. (Patient not taking: No sig reported) 15 tablet 0  . predniSONE (DELTASONE) 5 MG tablet Take 1 tablet (5 mg total) by mouth daily with breakfast. 30 tablet 3  . rosuvastatin (CRESTOR) 40 MG tablet Take 1 tablet by mouth once daily (Patient taking differently: Take 40 mg by mouth daily.) 30 tablet 1  . sacubitril-valsartan (ENTRESTO) 24-26 MG Take 1/2 tablet in the morning and 1 tablet in the evening (Patient taking differently: Take 0.5 tablets by mouth 2 (two) times daily.) 60 tablet 8  . tamsulosin (FLOMAX) 0.4 MG CAPS capsule Take 1 capsule (0.4 mg total) by mouth daily after supper. 90 capsule 3  . venlafaxine XR (EFFEXOR XR) 37.5 MG 24 hr capsule Take 1 capsule (37.5 mg total) by mouth daily with breakfast. 30 capsule 1    Allergies as of 02/22/2020  . (No Known Allergies)     Review of Systems:    Constitutional: No weight loss, fever or chills Skin: No rash  Cardiovascular: No chest pain Respiratory: No SOB  Gastrointestinal: See HPI and otherwise negative Genitourinary: No dysuria  Neurological: +dizziness and syncope Musculoskeletal: No new muscle or joint pain Hematologic: No bruising Psychiatric: No history of depression or anxiety    Physical Exam:  Vital signs in last 24 hours: Temp:  [98.2 F (36.8 C)-98.3 F (36.8 C)] 98.2 F (36.8 C) (12/16 0000) Pulse Rate:  [78-140] 92 (12/16 1030) Resp:  [13-26] 16 (12/16  1030) BP: (93-160)/(71-93) 111/77 (12/16 1030) SpO2:  [95 %-100 %] 98 % (12/16 1030) Weight:  [68.9 kg] 68.9 kg (12/15 2126)   General:   Pleasant AA male appears to be in NAD, Well developed, Well nourished, alert and cooperative Head:  Normocephalic and atraumatic. Eyes:   PEERL, EOMI. No icterus. Conjunctiva pink. Ears:  Normal auditory acuity. Neck:  Supple Throat: Oral cavity and pharynx without inflammation, swelling or lesion.  Lungs: Respirations even and unlabored. Lungs clear to auscultation bilaterally.   No wheezes, crackles, or rhonchi.  Heart: Normal S1, S2. No MRG. Regular rate and rhythm. No peripheral edema, cyanosis or pallor.  Abdomen:  Soft, nondistended, nontender. No rebound or guarding. Normal bowel sounds. No appreciable masses or hepatomegaly. Rectal:  Not performed.  Msk:  Symmetrical without gross deformities. Peripheral pulses intact.  Extremities:  Without edema, no deformity or joint abnormality.  Neurologic:  Alert and  oriented x4;  grossly normal neurologically.  Skin:   Dry and intact without significant lesions or rashes. Psychiatric: Demonstrates good judgement and reason without abnormal affect or behaviors.  LAB RESULTS: Recent Labs    02/22/20 2137 02/23/20  0251  WBC 6.8 5.3  HGB 10.6* 10.4*  HCT 33.2* 31.8*  PLT 319 301   BMET Recent Labs    02/22/20 2137 02/23/20 0251 02/23/20 0413  NA 133*  --  135  K 4.2  --  3.7  CL 96*  --  96*  CO2 27  --  27  GLUCOSE 118*  --  97  BUN 9  --  10  CREATININE 1.02 0.84 0.80  CALCIUM 9.9  --  9.8    STUDIES: DG Chest 2 View  Result Date: 02/22/2020 CLINICAL DATA:  Chest pain EXAM: CHEST - 2 VIEW COMPARISON:  11/03/2018 FINDINGS: Heart and mediastinal contours are within normal limits. No focal opacities or effusions. No acute bony abnormality. IMPRESSION: No active cardiopulmonary disease. Electronically Signed   By: Rolm Baptise M.D.   On: 02/22/2020 21:50   CT HEAD WO CONTRAST  Result  Date: 02/23/2020 CLINICAL DATA:  Right arm numbness. EXAM: CT HEAD WITHOUT CONTRAST TECHNIQUE: Contiguous axial images were obtained from the base of the skull through the vertex without intravenous contrast. COMPARISON:  11/03/2018 FINDINGS: Brain: No acute intracranial abnormality. Specifically, no hemorrhage, hydrocephalus, mass lesion, acute infarction, or significant intracranial injury. Vascular: No hyperdense vessel or unexpected calcification. Skull: No acute calvarial abnormality. Sinuses/Orbits: Visualized paranasal sinuses and mastoids clear. Orbital soft tissues unremarkable. Other: None IMPRESSION: Normal study. Electronically Signed   By: Rolm Baptise M.D.   On: 02/23/2020 00:44   VAS US CAROTID  Result Date: 02/23/2020 Carotid Arterial Duplex Study Indications:       Syncope. Risk Factors:      Hypertension, hyperlipidemia, current smoker, coronary artery                    disease. Other Factors:     Prostate cancer, ischemic cardiomyopathy, Warthin's tumor,                    right neck. Comparison Study:  Prior study indicating 1-39% ICA stenosis, from 07/07/14, is                    available for comparison Performing Technologist: Sharion Dove RVS  Examination Guidelines: A complete evaluation includes B-mode imaging, spectral Doppler, color Doppler, and power Doppler as needed of all accessible portions of each vessel. Bilateral testing is considered an integral part of a complete examination. Limited examinations for reoccurring indications may be performed as noted.  Right Carotid Findings: +----------+--------+--------+--------+------------------+--------+           PSV cm/sEDV cm/sStenosisPlaque DescriptionComments +----------+--------+--------+--------+------------------+--------+ CCA Prox  122     29                                         +----------+--------+--------+--------+------------------+--------+ CCA Distal110     29                                          +----------+--------+--------+--------+------------------+--------+ ICA Prox  45      16                                         +----------+--------+--------+--------+------------------+--------+ ICA Distal65      29                                         +----------+--------+--------+--------+------------------+--------+  ECA       50      10                                         +----------+--------+--------+--------+------------------+--------+ +----------+--------+-------+--------+-------------------+           PSV cm/sEDV cmsDescribeArm Pressure (mmHG) +----------+--------+-------+--------+-------------------+ [id number]                                         +----------+--------+-------+--------+-------------------+ +---------+--------+--+--------+--+ VertebralPSV cm/s82EDV cm/s29 +---------+--------+--+--------+--+  Left Carotid Findings: +----------+--------+--------+--------+------------------+--------+           PSV cm/sEDV cm/sStenosisPlaque DescriptionComments +----------+--------+--------+--------+------------------+--------+ CCA Prox  133     36                                         +----------+--------+--------+--------+------------------+--------+ CCA Distal72      23                                         +----------+--------+--------+--------+------------------+--------+ ICA Prox  59      28                                         +----------+--------+--------+--------+------------------+--------+ ICA Distal95      38                                         +----------+--------+--------+--------+------------------+--------+ ECA       62      17                                         +----------+--------+--------+--------+------------------+--------+ +----------+--------+--------+--------+-------------------+           PSV cm/sEDV cm/sDescribeArm Pressure (mmHG)  +----------+--------+--------+--------+-------------------+ [id number]                                          +----------+--------+--------+--------+-------------------+ +---------+--------+--+--------+-+ VertebralPSV cm/s13EDV cm/s4 +---------+--------+--+--------+-+     Preliminary     Impression / Plan:   Impression: 1.  Atypical chest pain rule out ACS 2.  Acute blood loss anemia: Reports some hematemesis, hematochezia and melena on Sunday, 02/19/2020 which continued into Monday and then stopped, hemoglobin 10.6 from baseline around 12 3.  Syncope 4.  Sinus tachycardia 5.  Chronic systolic CHF, LVEF 88-41% on 11/04/2018 6.  CAD status post PCI with stent in 2019: On Plavix  Plan: 1.  Patient will likely require an EGD and colonoscopy for further evaluation of this anemia with reports of hematemesis and melena.  Patient is on Plavix and his last dose was yesterday 12/15 a.m., ideally would hold this for 5 days prior to time of procedures. 2.  Patient also needs to await cardiac eval for his chest pain  and elevated troponins. 3.  Continue supportive measures with monitoring of hemoglobin and transfusion as needed less than 7 4.  Please await further recommendations from Dr. Silverio Decamp later today.  Timing of procedures will be better decided after cardiac evaluation.    Thank you for your kind consultation, we will continue to follow.  Lavone Nian [facility]  02/23/2020, 11:25 AM   Attending physician's note   I have taken a history, examined the patient and reviewed the chart. I agree with the Advanced Practitioner's note, impression and recommendations.  59 year old very pleasant male with recently diagnosed prostate cancer, CHF admitted with anemia and chest pain  He has been having blood in his stool and also had an episode of small-volume hematemesis and intermittent melena  Patient is on chronic antiplatelet therapy with Plavix, last dose was on 12/14 per  patient  No family history of colon cancer  Troponin is mildly elevated with nonspecific ST changes.  Cardiology evaluated patient, felt EKG changes were stable and he had stress test recently.  Ok to hold Plavix  We will plan for EGD and colonoscopy on Monday, 02/27/2020 after Plavix washout if he needs polypectomy or endoscopic intervention given he never had EGD or colonoscopy. Also need to exclude metastatic lesion or tumor invasion in the rectum  Continue heart healthy diet Clear liquids on Sunday along with bowel prep   The patient was provided an opportunity to ask questions and all were answered. The patient agreed with the plan and demonstrated an understanding of the instructions.  Damaris Hippo , MD [phone]

## 2020-02-23 NOTE — H&P (Signed)
History and Physical  Daniel Weiss EZM:[id number] DOB: [birth year] DOA: 02/22/2020  Referring physician: Dr. Christy Gentles PCP: Azzie Glatter, FNP  Outpatient Specialists: Cardiology Patient coming from: Home.  Chief Complaint: Chest pain.  HPI: Daniel Weiss is a 59 y.o. male with medical history significant for prostate cancer on Zytiga, coronary artery disease, chronic systolic CHF TK35% who presented to [facility] ED with complaints of non exertional chest pain, similar to chest pain from prior MI, sudden onset, initially started with right arm numbness then sudden onset of moderate substernal chest pain associated with dyspnea, palpitation, and dizziness.  Chest pain relieved by nitroglycerin.  Endorses hematochezia, melena and hematemesis a few days prior to this presentation.  Reports intermittent dizziness with passing out.  Work-up in the ED revealed elevated troponin, peaked at 61, positive FOBT.  CT head negative for acute intracranial findings.  ED Course: BP 109/76, heart rate 93, respiratory 16, O2 saturation 99% on room air.  Lab studies remarkable for troponin S 61 from 42 on presentation.  Serum sodium 133, creatinine 1.02 with GFR greater than 60.  Hemoglobin 10.6 with baseline hemoglobin of 12.  Twelve-lead EKG sinus tachycardia 115.  Nonspecific ST-T changes.  QTc 495.  Review of Systems: Review of systems as noted in the HPI. All other systems reviewed and are negative.   Past Medical History:  Diagnosis Date  . Abnormal chest CT 02/2018   a. Ectatic 3 cm Ao arch - rec f/u outpt imaging w/ CTA or MRA. Ectatic atheromatous abd Ao @ risk for aneurysm - rec f/u u/s in 5 yrs. Marked prostatic enlargement w/ scattered small sclerotic foci in the thoracic lower lumbar spine, sacrum, left 12th rib, pelvis, and right femur.  Recommend elective outpatient whole-body bone scintigraphy and PSA.  Marland Kitchen CAD (coronary artery disease)    a. 02/2018 ACS/PCI: LM nl, LAD 85p (3.5x15 Sierra DES), D1  45ost, RI 55ost, RCA nl, EF 25-35%.  . Cardiac murmur   . Complete left bundle branch block (LBBB) 2016  . Dyspnea   . HFrEF (heart failure with reduced ejection fraction) ([place])   . Hypertension   . Ischemic cardiomyopathy 02/2018   a. 02/2018 Echo: EF 35-40% (LV gram on cath was 25-30%), mod, diff HK. mid-apicalanteroseptal, ant, and apical sev HK. RVH.-->  No notable change on echo from June 2020 and August 2020.  Marland Kitchen Myocardial infarction ([place])   . NSTEMI (non-ST elevated myocardial infarction) ([place]) 02/2018   85% pLAD - DES PCI   . Prostate cancer metastatic to multiple sites [facility]) 11/2018  . Stroke ([place])   . TIA (transient ischemic attack) 2016  . Warthin's tumor    Past Surgical History:  Procedure Laterality Date  . CORONARY STENT INTERVENTION N/A 03/06/2018   Procedure: CORONARY STENT INTERVENTION;  Surgeon: [name], MD;  Location: [place] CV LAB;  Service: Cardiovascular: Proximal LAD 85% stenosis (and D1): DES PCI with Xience Sierra DES 3.5 mm x 15 mm - 3.9 mm  . INTRAOPERATIVE TRANSTHORACIC ECHOCARDIOGRAM  03/06/2018   (Peri-MI) mild reduced EF 35-40%.  Diffuse hypokinesis (severe HK of the mid-apical anteroseptal, anterior and apical myocardium consistent with LAD infarct).  Unable to assess diastolic function.  Paradoxical septal motion likely related to his LBBB.  RV hypertrophy noted.  Trivial pericardial effusion noted.   Marland Kitchen LEFT HEART CATH AND CORONARY ANGIOGRAPHY N/A 03/06/2018   Procedure: LEFT HEART CATH AND CORONARY ANGIOGRAPHY;  Surgeon: [name], MD;  Location: [place] CV LAB;  Service:  Cardiovascular: Proximal LAD 85% involving D1 45%.  Ostial RI 55%.  Severe LV dysfunction EF 25 to 30%.  1+ MR.  Only minimally elevated LVEDP.  Marland Kitchen ORCHIECTOMY Right 01/17/2020   Procedure: ORCHIECTOMY;  Surgeon: Abbie Sons, MD;  Location: [facility] ORS;  Service: Urology;  Laterality: Right;  . PROSTATE BIOPSY N/A 01/17/2020   Procedure: BIOPSY TRANSRECTAL  ULTRASONIC PROSTATE (TUBP);  Surgeon: Abbie Sons, MD;  Location: [facility] ORS;  Service: Urology;  Laterality: N/A;  . TRANSTHORACIC ECHOCARDIOGRAM  10/2018   (Most recent echo, to evaluate for TIA/CVA) : Moderate reduced EF 35 to 40%.  Moderate concentric LVH.  Mild dyskinesis of the apex and severe hypokinesis of mid apical anteroseptal and anterior wall.  Unable to assess RV pressures.  Aortic sclerosis with no stenosis.  No significant change seen    Social History:  reports that he has been smoking cigarettes. He has been smoking about 0.00 packs per day for the past 0.00 years. He has never used smokeless tobacco. He reports current alcohol use of about 2.0 standard drinks of alcohol per week. He reports current drug use. Frequency: 3.00 times per week. Drug: Marijuana.   No Known Allergies  Family History  Problem Relation Age of Onset  . Stroke Mother   . Hypertension Mother   . Diabetes type II Mother   . Leukemia Other   . Breast cancer Other   . Stroke Brother   . CAD Neg Hx       Prior to Admission medications   Medication Sig Start Date End Date Taking? Authorizing Provider  abiraterone acetate (ZYTIGA) 250 MG tablet Take 4 tablets (1,000 mg total) by mouth daily. Take on an empty stomach 1 hour before or 2 hours after a meal 02/10/20   Earlie Server, MD  albuterol (VENTOLIN HFA) 108 (90 Base) MCG/ACT inhaler Inhale 2 puffs into the lungs every 6 (six) hours as needed for wheezing or shortness of breath. 05/23/19   Azzie Glatter, FNP  clopidogrel (PLAVIX) 75 MG tablet Take 1 tablet (75 mg total) by mouth daily. 11/28/19   [name], MD  furosemide (LASIX) 20 MG tablet May take 20 mg tablet as needed for increase shortness of breath or swelling Patient taking differently: Take 20 mg by mouth daily as needed for edema (shortness of breath). 05/30/19   [name], MD  metoprolol succinate (TOPROL-XL) 25 MG 24 hr tablet Take 1 tablet (25 mg total) by mouth daily with  supper. 11/28/19   [name], MD  nitroGLYCERIN (NITROSTAT) 0.4 MG SL tablet Place 1 tablet (0.4 mg total) under the tongue every 5 (five) minutes as needed for chest pain. Patient not taking: No sig reported 03/10/18   Theora Gianotti, NP  ondansetron (ZOFRAN) 4 MG tablet Take 4 mg by mouth every 4 (four) hours as needed for nausea or vomiting.  10/16/19   [provider]  oxyCODONE-acetaminophen (PERCOCET/ROXICET) 5-325 MG tablet Take 1 tablet by mouth every 6 (six) hours as needed for severe pain. Patient not taking: No sig reported 01/17/20   Abbie Sons, MD  predniSONE (DELTASONE) 5 MG tablet Take 1 tablet (5 mg total) by mouth daily with breakfast. 02/10/20   Earlie Server, MD  rosuvastatin (CRESTOR) 40 MG tablet Take 1 tablet by mouth once daily Patient taking differently: Take 40 mg by mouth daily. 10/17/19   [name], MD  sacubitril-valsartan (ENTRESTO) 24-26 MG Take 1/2 tablet in the morning and  1 tablet in the evening Patient taking differently: Take 0.5 tablets by mouth 2 (two) times daily. 05/30/19   [name], MD  tamsulosin (FLOMAX) 0.4 MG CAPS capsule Take 1 capsule (0.4 mg total) by mouth daily after supper. 03/25/19   [name], MD  venlafaxine XR (EFFEXOR XR) 37.5 MG 24 hr capsule Take 1 capsule (37.5 mg total) by mouth daily with breakfast. 10/26/19   Abbie Sons, MD    Physical Exam: BP 121/77   Pulse 92   Temp 98.2 F (36.8 C) (Oral)   Resp 18   Ht 6' (1.829 m)   Wt 68.9 kg   SpO2 97%   BMI 20.61 kg/m   . General: 59 y.o. year-old male well developed well nourished in no acute distress.  Alert and oriented x3.  Right-sided neck mass that has been present for greater than 30 years. . Cardiovascular: Regular rate and rhythm with no rubs or gallops.  No thyromegaly or JVD noted.  No lower extremity edema. 2/4 pulses in all 4 extremities. Marland Kitchen Respiratory: Clear to auscultation with no wheezes or rales. Good inspiratory  effort. . Abdomen: Soft nontender nondistended with normal bowel sounds x4 quadrants. . Muskuloskeletal: No cyanosis, clubbing or edema noted bilaterally . Neuro: CN II-XII intact, strength, sensation, reflexes . Skin: No ulcerative lesions noted or rashes . Psychiatry: Judgement and insight appear normal. Mood is appropriate for condition and setting          Labs on Admission:  Basic Metabolic Panel: Recent Labs  Lab 02/22/20 2137  NA 133*  K 4.2  CL 96*  CO2 27  GLUCOSE 118*  BUN 9  CREATININE 1.02  CALCIUM 9.9   Liver Function Tests: No results for input(s): AST, ALT, ALKPHOS, BILITOT, PROT, ALBUMIN in the last 168 hours. No results for input(s): LIPASE, AMYLASE in the last 168 hours. No results for input(s): AMMONIA in the last 168 hours. CBC: Recent Labs  Lab 02/22/20 2137  WBC 6.8  HGB 10.6*  HCT 33.2*  MCV 82.6  PLT 319   Cardiac Enzymes: No results for input(s): CKTOTAL, CKMB, CKMBINDEX, TROPONINI in the last 168 hours.  BNP (last 3 results) No results for input(s): BNP in the last 8760 hours.  ProBNP (last 3 results) No results for input(s): PROBNP in the last 8760 hours.  CBG: No results for input(s): GLUCAP in the last 168 hours.  Radiological Exams on Admission: DG Chest 2 View  Result Date: 02/22/2020 CLINICAL DATA:  Chest pain EXAM: CHEST - 2 VIEW COMPARISON:  11/03/2018 FINDINGS: Heart and mediastinal contours are within normal limits. No focal opacities or effusions. No acute bony abnormality. IMPRESSION: No active cardiopulmonary disease. Electronically Signed   By: Rolm Baptise M.D.   On: 02/22/2020 21:50   CT HEAD WO CONTRAST  Result Date: 02/23/2020 CLINICAL DATA:  Right arm numbness. EXAM: CT HEAD WITHOUT CONTRAST TECHNIQUE: Contiguous axial images were obtained from the base of the skull through the vertex without intravenous contrast. COMPARISON:  11/03/2018 FINDINGS: Brain: No acute intracranial abnormality. Specifically, no  hemorrhage, hydrocephalus, mass lesion, acute infarction, or significant intracranial injury. Vascular: No hyperdense vessel or unexpected calcification. Skull: No acute calvarial abnormality. Sinuses/Orbits: Visualized paranasal sinuses and mastoids clear. Orbital soft tissues unremarkable. Other: None IMPRESSION: Normal study. Electronically Signed   By: Rolm Baptise M.D.   On: 02/23/2020 00:44    EKG: I independently viewed the EKG done and my findings are as followed: Sinus tachycardia rate of  115, nonspecific ST-T changes, QTC 495.  Assessment/Plan Present on Admission: . Chest pain  Active Problems:   Chest pain  Atypical chest pain rule out ACS Initial presentation of moderate nonexertional substernal chest pain Chest pain relieved by nitroglycerin First set of troponin 42 uptrending to 61, cycle troponins No evidence of acute ischemia on twelve-lead EKG Obtain 2D echo and lipid panel Consult cardiology in the morning He was scheduled for an outpatient cardiology appointment on 02/23/2020 with Dr. Ellyn Hack.  Acute blood loss anemia secondary to self-reported hematochezia and melena Positive FOBT on presentation Hemoglobin 10.6 from baseline of 12 Obtain CBC in the morning  ?Syncope, unclear etiology Intermittent dizziness Obtain orthostatic vital signs and UA Follow-up 2D echo Obtain carotid Doppler ultrasound PT OT to assess Fall precautions  Sinus tachycardia Obtain TSH Resume home Toprol-XL Monitor on telemetry  Mild hypovolemic hyponatremia Presented with serum sodium 133 He is on home diuretics Monitor for now  Chronic systolic CHF, LVEF 62% Last 2D echo done on 11/04/2018 showed LVEF 35 to 40% with severe hypokinesis of the left ventricular Obtain 2D echo on 02/23/2020 due to chest pain elevated troponin  Coronary artery disease status post PCI with stent in 2019 Continue home Plavix and Crestor  BPH with history of prostate cancer Resume home Flomax and  Zytiga Monitor urine output States there is plan for radiation  Hyperlipidemia Resume home Crestor  Chronic anxiety Stable Resume home Effexor XR       DVT prophylaxis: Subcu Lovenox daily  Code Status: Full code as stated by the patient himself.  Family Communication: None at bedside.  Disposition Plan: Admit to telemetry cardiac  Consults called: Please consult cardiology in the morning.  Admission status: Observation.   Status is: Observation    Dispo:  Patient From: Home  Planned Disposition: Home  Expected discharge date: 02/24/2020  Medically stable for discharge: No, ongoing management of chest pain to rule out ACS, possible syncope, acute blood loss anemia in the setting of self-reported GI bleed and positive FOBT.   Kayleen Memos MD [facility] Pager [phone]  If 7PM-7AM, please contact night-coverage [email or web address] Password [email or web address]  02/23/2020, 2:27 AM

## 2020-02-23 NOTE — Evaluation (Signed)
Physical Therapy Evaluation Patient Details Name: Daniel Weiss MRN: [id number] DOB: [birth year] Today's Date: 02/23/2020   History of Present Illness  Pt is a 59 y/o male admitted secondary to syncopal episodes with acute blood loss anemia and chest pain. Workup pending. PMH includes prostate CA, CAD,a nd CHF.  Clinical Impression  Pt admitted secondary to problem above with deficits below. Pt requiring min guard A +2 for safety to stand from higher stretcher height. BP stable throughout, however, upon standing pt reporting increased light headedness and dizziness. HR also elevated to upper 130s. Unable to attempt further mobility. Anticipate pt will progress well once symptoms improve. Will continue to follow acutely and update recommendations based on pt progression.     Follow Up Recommendations Other (comment) (TBD pending mobility progression)    Equipment Recommendations  Other (comment) (TBD pending mobility progression)    Recommendations for Other Services       Precautions / Restrictions Precautions Precautions: Fall Precaution Comments: 2 falls within the past week, 4 within the past month Restrictions Weight Bearing Restrictions: No      Mobility  Bed Mobility Overal bed mobility: Needs Assistance Bed Mobility: Supine to Sit;Sit to Supine     Supine to sit: Supervision Sit to supine: Supervision   General bed mobility comments: Supervision for safety and line management.    Transfers Overall transfer level: Needs assistance Equipment used: Rolling walker (2 wheeled) Transfers: Sit to/from Stand Sit to Stand: Min guard;+2 safety/equipment         General transfer comment: Pt with increased lightheadedness/dizziness and elevated HR up to high 130s. Pt unable to tolerate further mobility  Ambulation/Gait                Stairs            Wheelchair Mobility    Modified Rankin (Stroke Patients Only)       Balance Overall balance  assessment: Needs assistance Sitting-balance support: No upper extremity supported Sitting balance-Leahy Scale: Good     Standing balance support: Bilateral upper extremity supported Standing balance-Leahy Scale: Poor Standing balance comment: Reliant on BUE support                             Pertinent Vitals/Pain Pain Assessment: No/denies pain    Home Living Family/patient expects to be discharged to:: Private residence Living Arrangements: Spouse/significant other (sister) Available Help at Discharge: Available 24 hours/day Type of Home: Apartment Home Access: Level entry     Home Layout: One level Home Equipment: Walker - 2 wheels;Grab bars - toilet;Grab bars - tub/shower      Prior Function Level of Independence: Independent with assistive device(s)         Comments: occasionally uses RW; furniture/wall surfing; 4 falls in the past month; 2 within last week     Hand Dominance   Dominant Hand: Right    Extremity/Trunk Assessment   Upper Extremity Assessment Upper Extremity Assessment: Defer to OT evaluation RUE Deficits / Details: strength WFL; complains of new onset of numbness/tingling in R hand RUE Coordination: decreased fine motor    Lower Extremity Assessment Lower Extremity Assessment: Generalized weakness    Cervical / Trunk Assessment Cervical / Trunk Assessment: Normal  Communication   Communication: No difficulties  Cognition Arousal/Alertness: Awake/alert Behavior During Therapy: WFL for tasks assessed/performed Overall Cognitive Status: Within Functional Limits for tasks assessed  General Comments General comments (skin integrity, edema, etc.): See vitals flowsheet for orthostatics; BP WFL for position changes.    Exercises     Assessment/Plan    PT Assessment Patient needs continued PT services  PT Problem List Decreased balance;Decreased mobility;Decreased  activity tolerance;Decreased strength;Cardiopulmonary status limiting activity       PT Treatment Interventions Functional mobility training;DME instruction;Gait training;Therapeutic activities;Therapeutic exercise;Balance training;Patient/family education    PT Goals (Current goals can be found in the Care Plan section)  Acute Rehab PT Goals Patient Stated Goal: "to get back to doing what I used to do" PT Goal Formulation: With patient Time For Goal Achievement: 03/08/20 Potential to Achieve Goals: Good    Frequency Min 3X/week   Barriers to discharge        Co-evaluation PT/OT/SLP Co-Evaluation/Treatment: Yes Reason for Co-Treatment: Complexity of the patient's impairments (multi-system involvement) PT goals addressed during session: Mobility/safety with mobility;Balance         AM-PAC PT "6 Clicks" Mobility  Outcome Measure Help needed turning from your back to your side while in a flat bed without using bedrails?: None Help needed moving from lying on your back to sitting on the side of a flat bed without using bedrails?: None Help needed moving to and from a bed to a chair (including a wheelchair)?: A Little Help needed standing up from a chair using your arms (e.g., wheelchair or bedside chair)?: A Little Help needed to walk in hospital room?: A Lot Help needed climbing 3-5 steps with a railing? : A Lot 6 Click Score: 18    End of Session   Activity Tolerance: Treatment limited secondary to medical complications (Comment) (elevated HR, dizziness) Patient left: in bed;with call bell/phone within reach (on stretcher in ED) Nurse Communication: Mobility status PT Visit Diagnosis: Other abnormalities of gait and mobility (R26.89)    Time: 1121-6244 PT Time Calculation (min) (ACUTE ONLY): 20 min   Charges:   PT Evaluation $PT Eval Moderate Complexity: 1 Mod          Lou Miner, DPT  Acute Rehabilitation Services  Pager: [phone] Office: [phone]   Rudean Hitt 02/23/2020, 2:59 PM

## 2020-02-23 NOTE — Consult Note (Addendum)
Consultation  Referring Provider: Dr. Karleen Hampshire    Primary Care Physician:  Azzie Glatter, FNP Primary Gastroenterologist: Althia Forts       Reason for Consultation: Anemia, GI bleed            HPI:   Daniel Weiss is a 59 y.o. male with a past medical history significant for prostate cancer on Zytiga, CAD, chronic systolic CHF with an EF of 35%, who presented to the ED on 02/22/2020 with a complaint of nonexertional chest pain similar to chest pain from prior MI with sudden onset, initially started with right arm numbness.  Chest pain was relieved by nitroglycerin.  We are consulted in regards to report of hematochezia.    Today, the patient explains that he presented to the ER initially because he was having chest pain.  Tells me it felt similar to when he had heart attack.  Since he has been in the ER the pain is better.  Also describes that on Sunday he became somewhat nauseous when he woke up in the morning and went to the bathroom he passed some bright red blood mixed in with his stool, "it covered all of the toilet paper".  He then went back to try and lay down and instead had to vomit.  He saw a little bit of bright red blood in his vomitus and then proceeded to have black bowel movements about 2 a day with the last one being on Monday.  Since then he has had some normal brown stools over the past couple of days.  Denies any abdominal pain or further nausea or vomiting.  Associated symptoms include intermittent dizziness and passing out.    Patient is on Plavix and had his list last dose yesterday morning, 02/22/2020.    Denies any previous GI procedures including colonoscopy or endoscopy.    Denies fever, chills, weight loss, heartburn, reflux or symptoms that awaken him from sleep.  ED course: BP 109/76, heart rate 93, respiratory rate 16, lab studies remarkable for troponin of 61-from 42 on presentation, sodium 133, creatinine 1.02, hemoglobin 10.6 (baseline of around 12), sinus  tachycardia with nonspecific ST-T changes  Past Medical History:  Diagnosis Date  . Abnormal chest CT 02/2018   a. Ectatic 3 cm Ao arch - rec f/u outpt imaging w/ CTA or MRA. Ectatic atheromatous abd Ao @ risk for aneurysm - rec f/u u/s in 5 yrs. Marked prostatic enlargement w/ scattered small sclerotic foci in the thoracic lower lumbar spine, sacrum, left 12th rib, pelvis, and right femur.  Recommend elective outpatient whole-body bone scintigraphy and PSA.  Marland Kitchen CAD (coronary artery disease)    a. 02/2018 ACS/PCI: LM nl, LAD 85p (3.5x15 Sierra DES), D1 45ost, RI 55ost, RCA nl, EF 25-35%.  . Cardiac murmur   . Complete left bundle branch block (LBBB) 2016  . Dyspnea   . HFrEF (heart failure with reduced ejection fraction) ([place])   . Hypertension   . Ischemic cardiomyopathy 02/2018   a. 02/2018 Echo: EF 35-40% (LV gram on cath was 25-30%), mod, diff HK. mid-apicalanteroseptal, ant, and apical sev HK. RVH.-->  No notable change on echo from June 2020 and August 2020.  Marland Kitchen Myocardial infarction ([place])   . NSTEMI (non-ST elevated myocardial infarction) ([place]) 02/2018   85% pLAD - DES PCI   . Prostate cancer metastatic to multiple sites [facility]) 11/2018  . Stroke ([place])   . TIA (transient ischemic attack) 2016  . Warthin's tumor  Past Surgical History:  Procedure Laterality Date  . CORONARY STENT INTERVENTION N/A 03/06/2018   Procedure: CORONARY STENT INTERVENTION;  Surgeon: [name], MD;  Location: [facility];  Service: Cardiovascular: Proximal LAD 85% stenosis (and D1): DES PCI with Xience Sierra DES 3.5 mm x 15 mm - 3.9 mm  . INTRAOPERATIVE TRANSTHORACIC ECHOCARDIOGRAM  03/06/2018   (Peri-MI) mild reduced EF 35-40%.  Diffuse hypokinesis (severe HK of the mid-apical anteroseptal, anterior and apical myocardium consistent with LAD infarct).  Unable to assess diastolic function.  Paradoxical septal motion likely related to his LBBB.  RV hypertrophy noted.  Trivial pericardial effusion  noted.   Marland Kitchen LEFT HEART CATH AND CORONARY ANGIOGRAPHY N/A 03/06/2018   Procedure: LEFT HEART CATH AND CORONARY ANGIOGRAPHY;  Surgeon: [name], MD;  Location: [facility];  Service: Cardiovascular: Proximal LAD 85% involving D1 45%.  Ostial RI 55%.  Severe LV dysfunction EF 25 to 30%.  1+ MR.  Only minimally elevated LVEDP.  Marland Kitchen ORCHIECTOMY Right 01/17/2020   Procedure: ORCHIECTOMY;  Surgeon: Abbie Sons, MD;  Location: [facility];  Service: Urology;  Laterality: Right;  . PROSTATE BIOPSY N/A 01/17/2020   Procedure: BIOPSY TRANSRECTAL ULTRASONIC PROSTATE (TUBP);  Surgeon: Abbie Sons, MD;  Location: [facility];  Service: Urology;  Laterality: N/A;  . TRANSTHORACIC ECHOCARDIOGRAM  10/2018   (Most recent echo, to evaluate for TIA/CVA) : Moderate reduced EF 35 to 40%.  Moderate concentric LVH.  Mild dyskinesis of the apex and severe hypokinesis of mid apical anteroseptal and anterior wall.  Unable to assess RV pressures.  Aortic sclerosis with no stenosis.  No significant change seen    Family History  Problem Relation Age of Onset  . Stroke Mother   . Hypertension Mother   . Diabetes type II Mother   . Leukemia Other   . Breast cancer Other   . Stroke Brother   . CAD Neg Hx     Social History   Tobacco Use  . Smoking status: Current Some Day Smoker    Packs/day: 0.00    Years: 0.00    Pack years: 0.00    Types: Cigarettes  . Smokeless tobacco: Never Used  . Tobacco comment: quit three days ago  Vaping Use  . Vaping Use: Never used  Substance Use Topics  . Alcohol use: Yes    Alcohol/week: 2.0 standard drinks    Types: 2 Cans of beer per week    Comment: 2 Beers daily.  . Drug use: Yes    Frequency: 3.0 times per week    Types: Marijuana    Prior to Admission medications   Medication Sig Start Date End Date Taking? Authorizing Provider  abiraterone acetate (ZYTIGA) 250 MG tablet Take 4 tablets (1,000 mg total) by mouth daily. Take on an empty stomach 1 hour  before or 2 hours after a meal 02/10/20   Earlie Server, MD  albuterol (VENTOLIN HFA) 108 (90 Base) MCG/ACT inhaler Inhale 2 puffs into the lungs every 6 (six) hours as needed for wheezing or shortness of breath. 05/23/19   Azzie Glatter, FNP  clopidogrel (PLAVIX) 75 MG tablet Take 1 tablet (75 mg total) by mouth daily. 11/28/19   [name], MD  furosemide (LASIX) 20 MG tablet May take 20 mg tablet as needed for increase shortness of breath or swelling Patient taking differently: Take 20 mg by mouth daily as needed for edema (shortness of breath). 05/30/19   Glenetta Hew  W, MD  metoprolol succinate (TOPROL-XL) 25 MG 24 hr tablet Take 1 tablet (25 mg total) by mouth daily with supper. 11/28/19   [name], MD  nitroGLYCERIN (NITROSTAT) 0.4 MG SL tablet Place 1 tablet (0.4 mg total) under the tongue every 5 (five) minutes as needed for chest pain. Patient not taking: No sig reported 03/10/18   Theora Gianotti, NP  ondansetron (ZOFRAN) 4 MG tablet Take 4 mg by mouth every 4 (four) hours as needed for nausea or vomiting.  10/16/19   [provider]  oxyCODONE-acetaminophen (PERCOCET/ROXICET) 5-325 MG tablet Take 1 tablet by mouth every 6 (six) hours as needed for severe pain. Patient not taking: No sig reported 01/17/20   Abbie Sons, MD  predniSONE (DELTASONE) 5 MG tablet Take 1 tablet (5 mg total) by mouth daily with breakfast. 02/10/20   Earlie Server, MD  rosuvastatin (CRESTOR) 40 MG tablet Take 1 tablet by mouth once daily Patient taking differently: Take 40 mg by mouth daily. 10/17/19   [name], MD  sacubitril-valsartan (ENTRESTO) 24-26 MG Take 1/2 tablet in the morning and 1 tablet in the evening Patient taking differently: Take 0.5 tablets by mouth 2 (two) times daily. 05/30/19   [name], MD  tamsulosin (FLOMAX) 0.4 MG CAPS capsule Take 1 capsule (0.4 mg total) by mouth daily after supper. 03/25/19   [name], MD  venlafaxine XR (EFFEXOR XR) 37.5 MG  24 hr capsule Take 1 capsule (37.5 mg total) by mouth daily with breakfast. 10/26/19   Stoioff, Ronda Fairly, MD    Current Facility-Administered Medications  Medication Dose Route Frequency Provider Last Rate Last Admin  . abiraterone acetate (ZYTIGA) tablet 1,000 mg  1,000 mg Oral Daily Irene Pap N, DO      . acetaminophen (TYLENOL) tablet 650 mg  650 mg Oral Q4H PRN Irene Pap N, DO      . clopidogrel (PLAVIX) tablet 75 mg  75 mg Oral Daily Irene Pap N, DO   75 mg at 02/23/20 1026  . enoxaparin (LOVENOX) injection 40 mg  40 mg Subcutaneous Daily Irene Pap N, DO   40 mg at 02/23/20 1027  . metoprolol succinate (TOPROL-XL) 24 hr tablet 12.5 mg  12.5 mg Oral Q supper Hall, Carole N, DO      . predniSONE (DELTASONE) tablet 5 mg  5 mg Oral Q breakfast Ryan, Carole N, DO   5 mg at 02/23/20 1026  . prochlorperazine (COMPAZINE) injection 10 mg  10 mg Intravenous Q6H PRN Irene Pap N, DO      . rosuvastatin (CRESTOR) tablet 40 mg  40 mg Oral Daily Irene Pap N, DO   40 mg at 02/23/20 1028  . tamsulosin (FLOMAX) capsule 0.4 mg  0.4 mg Oral QPC supper Irene Pap N, DO      . venlafaxine XR (EFFEXOR-XR) 24 hr capsule 37.5 mg  37.5 mg Oral Q breakfast Irene Pap N, DO   37.5 mg at 02/23/20 1026   Current Outpatient Medications  Medication Sig Dispense Refill  . abiraterone acetate (ZYTIGA) 250 MG tablet Take 4 tablets (1,000 mg total) by mouth daily. Take on an empty stomach 1 hour before or 2 hours after a meal 120 tablet 0  . albuterol (VENTOLIN HFA) 108 (90 Base) MCG/ACT inhaler Inhale 2 puffs into the lungs every 6 (six) hours as needed for wheezing or shortness of breath. 8 g 11  . clopidogrel (PLAVIX) 75 MG tablet Take 1 tablet (75  mg total) by mouth daily. 90 tablet 3  . furosemide (LASIX) 20 MG tablet May take 20 mg tablet as needed for increase shortness of breath or swelling (Patient taking differently: Take 20 mg by mouth daily as needed for edema (shortness of breath).) 20 tablet  6  . metoprolol succinate (TOPROL-XL) 25 MG 24 hr tablet Take 1 tablet (25 mg total) by mouth daily with supper. 45 tablet 1  . nitroGLYCERIN (NITROSTAT) 0.4 MG SL tablet Place 1 tablet (0.4 mg total) under the tongue every 5 (five) minutes as needed for chest pain. (Patient not taking: No sig reported) 25 tablet 3  . ondansetron (ZOFRAN) 4 MG tablet Take 4 mg by mouth every 4 (four) hours as needed for nausea or vomiting.     Marland Kitchen oxyCODONE-acetaminophen (PERCOCET/ROXICET) 5-325 MG tablet Take 1 tablet by mouth every 6 (six) hours as needed for severe pain. (Patient not taking: No sig reported) 15 tablet 0  . predniSONE (DELTASONE) 5 MG tablet Take 1 tablet (5 mg total) by mouth daily with breakfast. 30 tablet 3  . rosuvastatin (CRESTOR) 40 MG tablet Take 1 tablet by mouth once daily (Patient taking differently: Take 40 mg by mouth daily.) 30 tablet 1  . sacubitril-valsartan (ENTRESTO) 24-26 MG Take 1/2 tablet in the morning and 1 tablet in the evening (Patient taking differently: Take 0.5 tablets by mouth 2 (two) times daily.) 60 tablet 8  . tamsulosin (FLOMAX) 0.4 MG CAPS capsule Take 1 capsule (0.4 mg total) by mouth daily after supper. 90 capsule 3  . venlafaxine XR (EFFEXOR XR) 37.5 MG 24 hr capsule Take 1 capsule (37.5 mg total) by mouth daily with breakfast. 30 capsule 1    Allergies as of 02/22/2020  . (No Known Allergies)     Review of Systems:    Constitutional: No weight loss, fever or chills Skin: No rash  Cardiovascular: No chest pain Respiratory: No SOB  Gastrointestinal: See HPI and otherwise negative Genitourinary: No dysuria  Neurological: +dizziness and syncope Musculoskeletal: No new muscle or joint pain Hematologic: No bruising Psychiatric: No history of depression or anxiety    Physical Exam:  Vital signs in last 24 hours: Temp:  [98.2 F (36.8 C)-98.3 F (36.8 C)] 98.2 F (36.8 C) (12/16 0000) Pulse Rate:  [78-140] 92 (12/16 1030) Resp:  [13-26] 16 (12/16  1030) BP: (93-160)/(71-93) 111/77 (12/16 1030) SpO2:  [95 %-100 %] 98 % (12/16 1030) Weight:  [68.9 kg] 68.9 kg (12/15 2126)   General:   Pleasant AA male appears to be in NAD, Well developed, Well nourished, alert and cooperative Head:  Normocephalic and atraumatic. Eyes:   PEERL, EOMI. No icterus. Conjunctiva pink. Ears:  Normal auditory acuity. Neck:  Supple Throat: Oral cavity and pharynx without inflammation, swelling or lesion.  Lungs: Respirations even and unlabored. Lungs clear to auscultation bilaterally.   No wheezes, crackles, or rhonchi.  Heart: Normal S1, S2. No MRG. Regular rate and rhythm. No peripheral edema, cyanosis or pallor.  Abdomen:  Soft, nondistended, nontender. No rebound or guarding. Normal bowel sounds. No appreciable masses or hepatomegaly. Rectal:  Not performed.  Msk:  Symmetrical without gross deformities. Peripheral pulses intact.  Extremities:  Without edema, no deformity or joint abnormality.  Neurologic:  Alert and  oriented x4;  grossly normal neurologically.  Skin:   Dry and intact without significant lesions or rashes. Psychiatric: Demonstrates good judgement and reason without abnormal affect or behaviors.  LAB RESULTS: Recent Labs    02/22/20 2137 02/23/20  0251  WBC 6.8 5.3  HGB 10.6* 10.4*  HCT 33.2* 31.8*  PLT 319 301   BMET Recent Labs    02/22/20 2137 02/23/20 0251 02/23/20 0413  NA 133*  --  135  K 4.2  --  3.7  CL 96*  --  96*  CO2 27  --  27  GLUCOSE 118*  --  97  BUN 9  --  10  CREATININE 1.02 0.84 0.80  CALCIUM 9.9  --  9.8    STUDIES: DG Chest 2 View  Result Date: 02/22/2020 CLINICAL DATA:  Chest pain EXAM: CHEST - 2 VIEW COMPARISON:  11/03/2018 FINDINGS: Heart and mediastinal contours are within normal limits. No focal opacities or effusions. No acute bony abnormality. IMPRESSION: No active cardiopulmonary disease. Electronically Signed   By: Rolm Baptise M.D.   On: 02/22/2020 21:50   CT HEAD WO CONTRAST  Result  Date: 02/23/2020 CLINICAL DATA:  Right arm numbness. EXAM: CT HEAD WITHOUT CONTRAST TECHNIQUE: Contiguous axial images were obtained from the base of the skull through the vertex without intravenous contrast. COMPARISON:  11/03/2018 FINDINGS: Brain: No acute intracranial abnormality. Specifically, no hemorrhage, hydrocephalus, mass lesion, acute infarction, or significant intracranial injury. Vascular: No hyperdense vessel or unexpected calcification. Skull: No acute calvarial abnormality. Sinuses/Orbits: Visualized paranasal sinuses and mastoids clear. Orbital soft tissues unremarkable. Other: None IMPRESSION: Normal study. Electronically Signed   By: Rolm Baptise M.D.   On: 02/23/2020 00:44   VAS US CAROTID  Result Date: 02/23/2020 Carotid Arterial Duplex Study Indications:       Syncope. Risk Factors:      Hypertension, hyperlipidemia, current smoker, coronary artery                    disease. Other Factors:     Prostate cancer, ischemic cardiomyopathy, Warthin's tumor,                    right neck. Comparison Study:  Prior study indicating 1-39% ICA stenosis, from 07/07/14, is                    available for comparison Performing Technologist: Sharion Dove RVS  Examination Guidelines: A complete evaluation includes B-mode imaging, spectral Doppler, color Doppler, and power Doppler as needed of all accessible portions of each vessel. Bilateral testing is considered an integral part of a complete examination. Limited examinations for reoccurring indications may be performed as noted.  Right Carotid Findings: +----------+--------+--------+--------+------------------+--------+           PSV cm/sEDV cm/sStenosisPlaque DescriptionComments +----------+--------+--------+--------+------------------+--------+ CCA Prox  122     29                                         +----------+--------+--------+--------+------------------+--------+ CCA Distal110     29                                          +----------+--------+--------+--------+------------------+--------+ ICA Prox  45      16                                         +----------+--------+--------+--------+------------------+--------+ ICA Distal65      29                                         +----------+--------+--------+--------+------------------+--------+  ECA       50      10                                         +----------+--------+--------+--------+------------------+--------+ +----------+--------+-------+--------+-------------------+           PSV cm/sEDV cmsDescribeArm Pressure (mmHG) +----------+--------+-------+--------+-------------------+ [id number]                                         +----------+--------+-------+--------+-------------------+ +---------+--------+--+--------+--+ VertebralPSV cm/s82EDV cm/s29 +---------+--------+--+--------+--+  Left Carotid Findings: +----------+--------+--------+--------+------------------+--------+           PSV cm/sEDV cm/sStenosisPlaque DescriptionComments +----------+--------+--------+--------+------------------+--------+ CCA Prox  133     36                                         +----------+--------+--------+--------+------------------+--------+ CCA Distal72      23                                         +----------+--------+--------+--------+------------------+--------+ ICA Prox  59      28                                         +----------+--------+--------+--------+------------------+--------+ ICA Distal95      38                                         +----------+--------+--------+--------+------------------+--------+ ECA       62      17                                         +----------+--------+--------+--------+------------------+--------+ +----------+--------+--------+--------+-------------------+           PSV cm/sEDV cm/sDescribeArm Pressure (mmHG)  +----------+--------+--------+--------+-------------------+ [id number]                                          +----------+--------+--------+--------+-------------------+ +---------+--------+--+--------+-+ VertebralPSV cm/s13EDV cm/s4 +---------+--------+--+--------+-+     Preliminary     Impression / Plan:   Impression: 1.  Atypical chest pain rule out ACS 2.  Acute blood loss anemia: Reports some hematemesis, hematochezia and melena on Sunday, 02/19/2020 which continued into Monday and then stopped, hemoglobin 10.6 from baseline around 12 3.  Syncope 4.  Sinus tachycardia 5.  Chronic systolic CHF, LVEF 18-56% on 11/04/2018 6.  CAD status post PCI with stent in 2019: On Plavix  Plan: 1.  Patient will likely require an EGD and colonoscopy for further evaluation of this anemia with reports of hematemesis and melena.  Patient is on Plavix and his last dose was yesterday 12/15 a.m., ideally would hold this for 5 days prior to time of procedures. 2.  Patient also needs to await cardiac eval for his chest pain  and elevated troponins. 3.  Continue supportive measures with monitoring of hemoglobin and transfusion as needed less than 7 4.  Please await further recommendations from Dr. Silverio Decamp later today.  Timing of procedures will be better decided after cardiac evaluation.    Thank you for your kind consultation, we will continue to follow.  Lavone Nian [facility]  02/23/2020, 11:25 AM   Attending physician's note   I have taken a history, examined the patient and reviewed the chart. I agree with the Advanced Practitioner's note, impression and recommendations.  59 year old very pleasant male with recently diagnosed prostate cancer, CHF admitted with anemia and chest pain  He has been having blood in his stool and also had an episode of small-volume hematemesis and intermittent melena  Patient is on chronic antiplatelet therapy with Plavix, last dose was on 12/14 per  patient  No family history of colon cancer  Troponin is mildly elevated with nonspecific ST changes.  Cardiology evaluated patient, felt EKG changes were stable and he had stress test recently.  Ok to hold Plavix  We will plan for EGD and colonoscopy on Monday, 02/27/2020 after Plavix washout if he needs polypectomy or endoscopic intervention given he never had EGD or colonoscopy. Also need to exclude metastatic lesion or tumor invasion in the rectum  Continue heart healthy diet Clear liquids on Sunday along with bowel prep   The patient was provided an opportunity to ask questions and all were answered. The patient agreed with the plan and demonstrated an understanding of the instructions.  Damaris Hippo , MD [phone]

## 2020-02-23 NOTE — ED Notes (Signed)
Pt speaking on phone with family 

## 2020-02-23 NOTE — ED Notes (Signed)
Lunch Tray Ordered @ 1032. 

## 2020-02-23 NOTE — Progress Notes (Signed)
VASCULAR LAB    Carotid duplex has been performed.  See CV proc for preliminary results.   Sahiba Granholm, RVT 02/23/2020, 9:07 AM

## 2020-02-24 DIAGNOSIS — K635 Polyp of colon: Secondary | ICD-10-CM | POA: Diagnosis not present

## 2020-02-24 DIAGNOSIS — I447 Left bundle-branch block, unspecified: Secondary | ICD-10-CM | POA: Diagnosis present

## 2020-02-24 DIAGNOSIS — I255 Ischemic cardiomyopathy: Secondary | ICD-10-CM | POA: Diagnosis present

## 2020-02-24 DIAGNOSIS — E871 Hypo-osmolality and hyponatremia: Secondary | ICD-10-CM | POA: Diagnosis present

## 2020-02-24 DIAGNOSIS — I2510 Atherosclerotic heart disease of native coronary artery without angina pectoris: Secondary | ICD-10-CM | POA: Diagnosis present

## 2020-02-24 DIAGNOSIS — D126 Benign neoplasm of colon, unspecified: Secondary | ICD-10-CM | POA: Diagnosis present

## 2020-02-24 DIAGNOSIS — F419 Anxiety disorder, unspecified: Secondary | ICD-10-CM | POA: Diagnosis present

## 2020-02-24 DIAGNOSIS — D62 Acute posthemorrhagic anemia: Secondary | ICD-10-CM | POA: Diagnosis present

## 2020-02-24 DIAGNOSIS — K920 Hematemesis: Secondary | ICD-10-CM | POA: Diagnosis present

## 2020-02-24 DIAGNOSIS — I252 Old myocardial infarction: Secondary | ICD-10-CM | POA: Diagnosis not present

## 2020-02-24 DIAGNOSIS — K922 Gastrointestinal hemorrhage, unspecified: Secondary | ICD-10-CM | POA: Diagnosis present

## 2020-02-24 DIAGNOSIS — K648 Other hemorrhoids: Secondary | ICD-10-CM | POA: Diagnosis present

## 2020-02-24 DIAGNOSIS — K449 Diaphragmatic hernia without obstruction or gangrene: Secondary | ICD-10-CM | POA: Diagnosis present

## 2020-02-24 DIAGNOSIS — K625 Hemorrhage of anus and rectum: Secondary | ICD-10-CM | POA: Diagnosis not present

## 2020-02-24 DIAGNOSIS — R55 Syncope and collapse: Secondary | ICD-10-CM | POA: Diagnosis present

## 2020-02-24 DIAGNOSIS — K31819 Angiodysplasia of stomach and duodenum without bleeding: Secondary | ICD-10-CM | POA: Diagnosis not present

## 2020-02-24 DIAGNOSIS — E785 Hyperlipidemia, unspecified: Secondary | ICD-10-CM | POA: Diagnosis present

## 2020-02-24 DIAGNOSIS — I5042 Chronic combined systolic (congestive) and diastolic (congestive) heart failure: Secondary | ICD-10-CM | POA: Diagnosis present

## 2020-02-24 DIAGNOSIS — K31811 Angiodysplasia of stomach and duodenum with bleeding: Secondary | ICD-10-CM | POA: Diagnosis present

## 2020-02-24 DIAGNOSIS — I110 Hypertensive heart disease with heart failure: Secondary | ICD-10-CM | POA: Diagnosis present

## 2020-02-24 DIAGNOSIS — K621 Rectal polyp: Secondary | ICD-10-CM | POA: Diagnosis present

## 2020-02-24 DIAGNOSIS — Z20822 Contact with and (suspected) exposure to covid-19: Secondary | ICD-10-CM | POA: Diagnosis present

## 2020-02-24 DIAGNOSIS — Z8673 Personal history of transient ischemic attack (TIA), and cerebral infarction without residual deficits: Secondary | ICD-10-CM | POA: Diagnosis not present

## 2020-02-24 DIAGNOSIS — E861 Hypovolemia: Secondary | ICD-10-CM | POA: Diagnosis present

## 2020-02-24 DIAGNOSIS — Z8249 Family history of ischemic heart disease and other diseases of the circulatory system: Secondary | ICD-10-CM | POA: Diagnosis not present

## 2020-02-24 DIAGNOSIS — C61 Malignant neoplasm of prostate: Secondary | ICD-10-CM | POA: Diagnosis present

## 2020-02-24 DIAGNOSIS — Z823 Family history of stroke: Secondary | ICD-10-CM | POA: Diagnosis not present

## 2020-02-24 DIAGNOSIS — R079 Chest pain, unspecified: Secondary | ICD-10-CM | POA: Diagnosis not present

## 2020-02-24 DIAGNOSIS — F17210 Nicotine dependence, cigarettes, uncomplicated: Secondary | ICD-10-CM | POA: Diagnosis present

## 2020-02-24 DIAGNOSIS — Z955 Presence of coronary angioplasty implant and graft: Secondary | ICD-10-CM | POA: Diagnosis not present

## 2020-02-24 LAB — BASIC METABOLIC PANEL
Anion gap: 9 (ref 5–15)
BUN: 5 mg/dL — ABNORMAL LOW (ref 6–20)
CO2: 26 mmol/L (ref 22–32)
Calcium: 9.5 mg/dL (ref 8.9–10.3)
Chloride: 98 mmol/L (ref 98–111)
Creatinine, Ser: 0.84 mg/dL (ref 0.61–1.24)
GFR, Estimated: >60 mL/min (ref >60)
Glucose, Bld: 100 mg/dL — ABNORMAL HIGH (ref 70–99)
Potassium: 3.5 mmol/L (ref 3.5–5.1)
Sodium: 133 mmol/L — ABNORMAL LOW (ref 135–145)

## 2020-02-24 LAB — CBC
HCT: 29.1 % — ABNORMAL LOW (ref 39.0–52.0)
Hemoglobin: 10.1 g/dL — ABNORMAL LOW (ref 13.0–17.0)
MCH: 28 pg (ref 26.0–34.0)
MCHC: 34.7 g/dL (ref 30.0–36.0)
MCV: 80.6 fL (ref 80.0–100.0)
Platelets: 291 10*3/uL (ref 150–400)
RBC: 3.61 MIL/uL — ABNORMAL LOW (ref 4.22–5.81)
RDW: 16.3 % — ABNORMAL HIGH (ref 11.5–15.5)
WBC: 5.8 10*3/uL (ref 4.0–10.5)
nRBC: 0 % (ref 0.0–0.2)

## 2020-02-24 LAB — MAGNESIUM: Magnesium: 1.9 mg/dL (ref 1.7–2.4)

## 2020-02-24 MED ORDER — POTASSIUM CHLORIDE CRYS ER 20 MEQ PO TBCR
40.0000 meq | EXTENDED_RELEASE_TABLET | Freq: Once | ORAL | Status: AC
  Administered 2020-02-24: 09:00:00 40 meq via ORAL
  Filled 2020-02-24: qty 2

## 2020-02-24 MED ORDER — FUROSEMIDE 20 MG PO TABS
20.0000 mg | ORAL_TABLET | Freq: Every day | ORAL | Status: DC
  Administered 2020-02-24 – 2020-02-28 (×5): 20 mg via ORAL
  Filled 2020-02-24 (×5): qty 1

## 2020-02-24 MED ORDER — ENOXAPARIN SODIUM 40 MG/0.4ML ~~LOC~~ SOLN
40.0000 mg | Freq: Every day | SUBCUTANEOUS | Status: DC
  Administered 2020-02-25 – 2020-02-26 (×2): 40 mg via SUBCUTANEOUS
  Filled 2020-02-24 (×2): qty 0.4

## 2020-02-24 MED ORDER — SACUBITRIL-VALSARTAN 24-26 MG PO TABS
1.0000 | ORAL_TABLET | Freq: Two times a day (BID) | ORAL | Status: DC
  Administered 2020-02-24 – 2020-02-28 (×9): 1 via ORAL
  Filled 2020-02-24 (×9): qty 1

## 2020-02-24 MED ORDER — PEG-KCL-NACL-NASULF-NA ASC-C 100 G PO SOLR
0.5000 | Freq: Once | ORAL | Status: AC
  Administered 2020-02-26: 20:00:00 100 g via ORAL
  Filled 2020-02-24: qty 1

## 2020-02-24 MED ORDER — PEG-KCL-NACL-NASULF-NA ASC-C 100 G PO SOLR: 1.0000 | Freq: Once | ORAL | Status: DC

## 2020-02-24 MED ORDER — PEG-KCL-NACL-NASULF-NA ASC-C 100 G PO SOLR
0.5000 | Freq: Once | ORAL | Status: AC
  Administered 2020-02-26: 17:00:00 100 g via ORAL
  Filled 2020-02-24: qty 1

## 2020-02-24 NOTE — Progress Notes (Signed)
Physical Therapy Treatment Patient Details Name: Daniel Weiss MRN: [id number] DOB: 09-21-1960 Today's Date: 02/24/2020    History of Present Illness Pt is a 59 y/o male admitted secondary to syncopal episodes with acute blood loss anemia and chest pain. Workup pending. PMH includes prostate CA, CAD,and CHF.    PT Comments    Pt admitted with above diagnosis. Pt was able to take orthostatic VS which fluctuated but were not positive for orthostasis.  Given that pt could not tolerate more than taking orthostatic VS and wanted to lie down, asked pt if PT could assess for vestibular issue.  Pt with positive hallpick dix to right therefore treated with canalith repositioning maneuver. Will continue acute PT as pt tolerates.  Pt currently with functional limitations due to balance and endurance deficits. Pt will benefit from skilled PT to increase their independence and safety with mobility to allow discharge to the venue listed below.   Orthostatic BPs  Supine 106/75, 86 bpm  Sitting 117/80, 97 bpm  Standing 105/72, 123 bpm  Standing after 3 min 108/71, 130 bpm     Follow Up Recommendations  Other (comment) (TBD pending mobility progression)     Equipment Recommendations  Other (comment) (TBD pending mobility progression)    Recommendations for Other Services       Precautions / Restrictions Precautions Precautions: Fall Precaution Comments: 2 falls within the past week, 4 within the past month Restrictions Weight Bearing Restrictions: No    Mobility  Bed Mobility Overal bed mobility: Needs Assistance Bed Mobility: Supine to Sit;Sit to Supine     Supine to sit: Supervision Sit to supine: Supervision   General bed mobility comments: Supervision for safety and line management.  Transfers Overall transfer level: Needs assistance Equipment used: Rolling walker (2 wheeled) Transfers: Sit to/from Stand Sit to Stand: Min guard         General transfer comment: Pt with  increased lightheadedness/dizziness and elevated HR up to high 130's.  See orthostatic VS.  Screen for vertigo as well. See assessment.  Ambulation/Gait                 Stairs             Wheelchair Mobility    Modified Rankin (Stroke Patients Only)       Balance Overall balance assessment: Needs assistance Sitting-balance support: No upper extremity supported Sitting balance-Leahy Scale: Good     Standing balance support: Bilateral upper extremity supported Standing balance-Leahy Scale: Poor Standing balance comment: Reliant on BUE support                            Cognition Arousal/Alertness: Awake/alert Behavior During Therapy: WFL for tasks assessed/performed Overall Cognitive Status: Within Functional Limits for tasks assessed                                        Exercises      General Comments General comments (skin integrity, edema, etc.): Performed canalith repositioning maneuver for right posterior canal BPPV.      Pertinent Vitals/Pain Pain Assessment: No/denies pain    Home Living                      Prior Function            PT Goals (current goals can now be found  in the care plan section) Acute Rehab PT Goals Patient Stated Goal: "to get back to doing what I used to do" Progress towards PT goals: Progressing toward goals    Frequency    Min 3X/week      PT Plan Current plan remains appropriate    Co-evaluation              AM-PAC PT "6 Clicks" Mobility   Outcome Measure  Help needed turning from your back to your side while in a flat bed without using bedrails?: None Help needed moving from lying on your back to sitting on the side of a flat bed without using bedrails?: None Help needed moving to and from a bed to a chair (including a wheelchair)?: A Little Help needed standing up from a chair using your arms (e.g., wheelchair or bedside chair)?: A Little Help needed to  walk in hospital room?: A Lot Help needed climbing 3-5 steps with a railing? : A Lot 6 Click Score: 18    End of Session Equipment Utilized During Treatment: Gait belt Activity Tolerance: Treatment limited secondary to medical complications (Comment) (elevated HR, dizziness) Patient left: in bed;with bed alarm set;with call bell/phone within reach Nurse Communication: Mobility status PT Visit Diagnosis: Other abnormalities of gait and mobility (R26.89)     Time: 7616-0737 PT Time Calculation (min) (ACUTE ONLY): 21 min  Charges:  $Canalith Rep Proc: 8-22 mins                     Ahlam Piscitelli W,PT Acute Rehabilitation Services Pager:  [phone]  Office:  [place] 02/24/2020, 4:43 PM

## 2020-02-24 NOTE — Progress Notes (Signed)
PROGRESS NOTE    Daniel Weiss  WUG:[id number] DOB: 1960/12/11 DOA: 02/22/2020 PCP: Azzie Glatter, FNP    Chief Complaint  Patient presents with  . Chest Pain    Brief Narrative:  59 year old gentleman prior history of chronic systolic and diastolic heart failure with left ventricular ejection fraction of 35%, coronary artery disease s/p PCI to LAD/2019, ischemic cardiomyopathy, CVA, hypertension, hyperlipidemia, metastatic prostate cancer presents to ED for chest pain and lightheadedness. He was found to have rectal bleeding and guaiac positive stools.  His EKG does not show any ischemic changes.  Troponins are mildly elevated. Currently patient is chest pain free.  Cardiology consulted to see if he needs further evaluation.  GI consulted for rectal bleeding.  Assessment & Plan:   Active Problems:   Chest pain, rule out acute myocardial infarction   Rectal bleeding   Absolute anemia   Acute blood loss anemia   Atypical chest pain elevated troponins, EKG shows left bundle branch block and is unchanged, no evidence of ischemia.  Echocardiogram left ventricle ejection fraction of 35%.  Cardiology consulted and no further work-up needed at this time.   Rectal bleeding, associated with some dizziness and lightheadedness Guaiac positive stools present on admission. GI consulted and he is scheduled for EGD and colonoscopy on 02/27/2020 Continue with IV PPI twice daily, hold Plavix.    Chronic systolic diastolic heart failure He appears to be compensated at this time. Left ventricular ejection fraction around 35% Continue with Lasix, Entresto, metoprolol and Plavix on hold for rectal bleeding.     History of coronary artery disease s/p PCI to LAD in 2019, ischemic cardiomyopathy No chest pain the last 2 days.   History of CVA Continue with Crestor and Plavix on hold for rectal bleeding and plan to start Plavix at the discretion of GI after EGD and  colonoscopy.   Essential hypertension Blood pressure parameters are optimal at this time   DVT prophylaxis: (scd's) Code Status: (Full code) Family Communication: none at bedside.  Disposition:   Status is: Observation  The patient will require care spanning > 2 midnights and should be moved to inpatient because: Ongoing diagnostic testing needed not appropriate for outpatient work up and Unsafe d/c plan  Dispo:  Patient From: Home  Planned Disposition: Home  Expected discharge date: 02/27/2020  Medically stable for discharge: No   Consultants:   Gastroenterology.   Cardiology   Procedures: EGD and colonoscopy to be scheduled on 02/27/2020   Antimicrobials: none.    Subjective: No new complaints.  No chest pain shortness of breath, nausea or vomiting.  No rectal bleeding  Objective: Vitals:   02/24/20 0027 02/24/20 0424 02/24/20 0725 02/24/20 1105  BP: (!) 144/89 (!) 127/99 (!) 133/98 111/78  Pulse: 78 90 (!) 105 (!) 108  Resp: 18 16 18 16   Temp: 98.7 F (37.1 C) 98.3 F (36.8 C) 98.6 F (37 C) 99 F (37.2 C)  TempSrc: Oral Oral Oral Oral  SpO2: 99% 98% 98% 99%  Weight:  65.6 kg    Height:        Intake/Output Summary (Last 24 hours) at 02/24/2020 1520 Last data filed at 02/24/2020 1351 Gross per 24 hour  Intake 357 ml  Output 775 ml  Net -418 ml   Filed Weights   02/22/20 2126 02/23/20 1453 02/24/20 0424  Weight: 68.9 kg 65.5 kg 65.6 kg    Examination:  General exam: Alert, comfortable not in any distress Respiratory system: Clear to auscultation bilaterally,  no wheezing or rhonchi Cardiovascular system: S1-S2 heard, tachycardic, no JVD, no pedal edema Gastrointestinal system: Abdomen is soft, nontender bowel sounds normal Central nervous system: Alert and oriented and grossly nonfocal Extremities: No pedal edema or cyanosis skin: No rashes seen  psychiatry: Mood is appropriate    Data Reviewed: I have personally reviewed following labs  and imaging studies  CBC: Recent Labs  Lab 02/22/20 2137 02/23/20 0251 02/23/20 1554 02/24/20 0249  WBC 6.8 5.3  --  5.8  HGB 10.6* 10.4* 10.4* 10.1*  HCT 33.2* 31.8* 29.7* 29.1*  MCV 82.6 82.0  --  80.6  PLT 319 301  --  170    Basic Metabolic Panel: Recent Labs  Lab 02/22/20 2137 02/23/20 0251 02/23/20 0413 02/23/20 1554 02/24/20 0249  NA 133*  --  135  --  133*  K 4.2  --  3.7  --  3.5  CL 96*  --  96*  --  98  CO2 27  --  27  --  26  GLUCOSE 118*  --  97  --  100*  BUN 9  --  10  --  5*  CREATININE 1.02 0.84 0.80  --  0.84  CALCIUM 9.9  --  9.8  --  9.5  MG  --   --   --  1.8 1.9    GFR: Estimated Creatinine Clearance: 86.8 mL/min (by C-G formula based on SCr of 0.84 mg/dL).  Liver Function Tests: No results for input(s): AST, ALT, ALKPHOS, BILITOT, PROT, ALBUMIN in the last 168 hours.  CBG: No results for input(s): GLUCAP in the last 168 hours.   Recent Results (from the past 240 hour(s))  Resp Panel by RT-PCR (Flu A&B, Covid) Nasopharyngeal Swab     Status: None   Collection Time: 02/23/20  2:02 AM   Specimen: Nasopharyngeal Swab; Nasopharyngeal(NP) swabs in vial transport medium  Result Value Ref Range Status   SARS Coronavirus 2 by RT PCR NEGATIVE NEGATIVE Final    Comment: (NOTE) SARS-CoV-2 target nucleic acids are NOT DETECTED.  The SARS-CoV-2 RNA is generally detectable in upper respiratory specimens during the acute phase of infection. The lowest concentration of SARS-CoV-2 viral copies this assay can detect is 138 copies/mL. A negative result does not preclude SARS-Cov-2 infection and should not be used as the sole basis for treatment or other patient management decisions. A negative result may occur with  improper specimen collection/handling, submission of specimen other than nasopharyngeal swab, presence of viral mutation(s) within the areas targeted by this assay, and inadequate number of viral copies(<138 copies/mL). A negative result  must be combined with clinical observations, patient history, and epidemiological information. The expected result is Negative.  Fact Sheet for Patients:  EntrepreneurPulse.com.au  Fact Sheet for Healthcare Providers:  IncredibleEmployment.be  This test is no t yet approved or cleared by the [place] FDA and  has been authorized for detection and/or diagnosis of SARS-CoV-2 by FDA under an Emergency Use Authorization (EUA). This EUA will remain  in effect (meaning this test can be used) for the duration of the COVID-19 declaration under Section 564(b)(1) of the Act, 21 U.S.C.section 360bbb-3(b)(1), unless the authorization is terminated  or revoked sooner.       Influenza A by PCR NEGATIVE NEGATIVE Final   Influenza B by PCR NEGATIVE NEGATIVE Final    Comment: (NOTE) The Xpert Xpress SARS-CoV-2/FLU/RSV plus assay is intended as an aid in the diagnosis of influenza from Nasopharyngeal swab specimens and should not  be used as a sole basis for treatment. Nasal washings and aspirates are unacceptable for Xpert Xpress SARS-CoV-2/FLU/RSV testing.  Fact Sheet for Patients: EntrepreneurPulse.com.au  Fact Sheet for Healthcare Providers: IncredibleEmployment.be  This test is not yet approved or cleared by the [place] FDA and has been authorized for detection and/or diagnosis of SARS-CoV-2 by FDA under an Emergency Use Authorization (EUA). This EUA will remain in effect (meaning this test can be used) for the duration of the COVID-19 declaration under Section 564(b)(1) of the Act, 21 U.S.C. section 360bbb-3(b)(1), unless the authorization is terminated or revoked.  Performed at [facility], [place] [address], [place], [place] [address]          Radiology Studies: DG Chest 2 View  Result Date: 02/22/2020 CLINICAL DATA:  Chest pain EXAM: CHEST - 2 VIEW COMPARISON:  11/03/2018 FINDINGS: Heart  and mediastinal contours are within normal limits. No focal opacities or effusions. No acute bony abnormality. IMPRESSION: No active cardiopulmonary disease. Electronically Signed   By: Rolm Baptise M.D.   On: 02/22/2020 21:50   CT HEAD WO CONTRAST  Result Date: 02/23/2020 CLINICAL DATA:  Right arm numbness. EXAM: CT HEAD WITHOUT CONTRAST TECHNIQUE: Contiguous axial images were obtained from the base of the skull through the vertex without intravenous contrast. COMPARISON:  11/03/2018 FINDINGS: Brain: No acute intracranial abnormality. Specifically, no hemorrhage, hydrocephalus, mass lesion, acute infarction, or significant intracranial injury. Vascular: No hyperdense vessel or unexpected calcification. Skull: No acute calvarial abnormality. Sinuses/Orbits: Visualized paranasal sinuses and mastoids clear. Orbital soft tissues unremarkable. Other: None IMPRESSION: Normal study. Electronically Signed   By: Rolm Baptise M.D.   On: 02/23/2020 00:44   ECHOCARDIOGRAM COMPLETE  Result Date: 02/23/2020    ECHOCARDIOGRAM REPORT   Patient Name:   Daniel Weiss Date of Exam: 02/23/2020 Medical Rec #:  [id number]    Height:       72.0 in Accession #:    [id number]   Weight:       152.0 lb Date of Birth:  [birth year]   BSA:          1.896 m Patient Age:    24 years     BP:           105/81 mmHg Patient Gender: M            HR:           96 bpm. Exam Location:  Inpatient Procedure: 2D Echo, Cardiac Doppler and Color Doppler Indications:    R07.9* Chest pain, unspecified  History:        Patient has prior history of Echocardiogram examinations, most                 recent 11/04/2018. Cardiomyopathy, Previous Myocardial Infarction                 and CAD, TIA and Stroke, Arrythmias:LBBB,                 Signs/Symptoms:Dyspnea; Risk Factors:Hypertension.  Sonographer:    Bernadene Person RDCS Referring Phys: [id number] [place]  1. Akinesis of the distal anteroseptal and apical walls with overall moderate to  severe LV dysfunction.  2. Left ventricular ejection fraction, by estimation, is 30 to 35%. The left ventricle has moderate to severely decreased function. The left ventricle demonstrates regional wall motion abnormalities (see scoring diagram/findings for description). Left ventricular diastolic parameters are consistent with Grade II diastolic dysfunction (pseudonormalization).  3. Right ventricular systolic function  is normal. The right ventricular size is normal.  4. The mitral valve is normal in structure. No evidence of mitral valve regurgitation. No evidence of mitral stenosis.  5. The aortic valve has an indeterminant number of cusps. Aortic valve regurgitation is trivial. No aortic stenosis is present.  6. The inferior vena cava is normal in size with greater than 50% respiratory variability, suggesting right atrial pressure of 3 mmHg. FINDINGS  Left Ventricle: Left ventricular ejection fraction, by estimation, is 30 to 35%. The left ventricle has moderate to severely decreased function. The left ventricle demonstrates regional wall motion abnormalities. The left ventricular internal cavity size was normal in size. There is no left ventricular hypertrophy. Abnormal (paradoxical) septal motion, consistent with left bundle branch block. Left ventricular diastolic parameters are consistent with Grade II diastolic dysfunction (pseudonormalization). Right Ventricle: The right ventricular size is normal. Right ventricular systolic function is normal. Left Atrium: Left atrial size was normal in size. Right Atrium: Right atrial size was normal in size. Pericardium: There is no evidence of pericardial effusion. Mitral Valve: The mitral valve is normal in structure. No evidence of mitral valve regurgitation. No evidence of mitral valve stenosis. Tricuspid Valve: The tricuspid valve is normal in structure. Tricuspid valve regurgitation is trivial. No evidence of tricuspid stenosis. Aortic Valve: The aortic valve has  an indeterminant number of cusps. Aortic valve regurgitation is trivial. No aortic stenosis is present. Pulmonic Valve: The pulmonic valve was not well visualized. Pulmonic valve regurgitation is not visualized. No evidence of pulmonic stenosis. Aorta: The aortic root is normal in size and structure. Venous: The inferior vena cava is normal in size with greater than 50% respiratory variability, suggesting right atrial pressure of 3 mmHg. IAS/Shunts: No atrial level shunt detected by color flow Doppler. Additional Comments: Akinesis of the distal anteroseptal and apical walls with overall moderate to severe LV dysfunction.  LEFT VENTRICLE PLAX 2D LVIDd:         4.30 cm  Diastology LVIDs:         3.10 cm  LV e' medial:    6.85 cm/s LV PW:         0.90 cm  LV E/e' medial:  14.6 LV IVS:        1.00 cm  LV e' lateral:   9.32 cm/s LVOT diam:     2.10 cm  LV E/e' lateral: 10.7 LV SV:         94 LV SV Index:   49 LVOT Area:     3.46 cm  RIGHT VENTRICLE RV S prime:     10.10 cm/s TAPSE (M-mode): 2.2 cm LEFT ATRIUM             Index       RIGHT ATRIUM           Index LA diam:        3.00 cm 1.58 cm/m  RA Area:     11.90 cm LA Vol (A2C):   24.6 ml 12.97 ml/m RA Volume:   24.40 ml  12.87 ml/m LA Vol (A4C):   19.2 ml 10.13 ml/m LA Biplane Vol: 22.6 ml 11.92 ml/m  AORTIC VALVE LVOT Vmax:   169.00 cm/s LVOT Vmean:  116.000 cm/s LVOT VTI:    0.270 m  AORTA Ao Root diam: 3.00 cm Ao Asc diam:  2.90 cm MITRAL VALVE MV Area (PHT): 6.83 cm     SHUNTS MV Decel Time: 111 msec     Systemic VTI:  0.27  m MV E velocity: 100.00 cm/s  Systemic Diam: 2.10 cm MV A velocity: 63.40 cm/s MV E/A ratio:  1.58 Kirk Ruths MD Electronically signed by Kirk Ruths MD Signature Date/Time: 02/23/2020/11:30:22 AM    Final    VAS US CAROTID  Result Date: 02/23/2020 Carotid Arterial Duplex Study Indications:       Syncope. Risk Factors:      Hypertension, hyperlipidemia, current smoker, coronary artery                    disease. Other  Factors:     Prostate cancer, ischemic cardiomyopathy, Warthin's tumor,                    right neck. Comparison Study:  Prior study indicating 1-39% ICA stenosis, from 07/07/14, is                    available for comparison Performing Technologist: Sharion Dove RVS  Examination Guidelines: A complete evaluation includes B-mode imaging, spectral Doppler, color Doppler, and power Doppler as needed of all accessible portions of each vessel. Bilateral testing is considered an integral part of a complete examination. Limited examinations for reoccurring indications may be performed as noted.  Right Carotid Findings: +----------+--------+--------+--------+------------------+--------+           PSV cm/sEDV cm/sStenosisPlaque DescriptionComments +----------+--------+--------+--------+------------------+--------+ CCA Prox  122     29                                         +----------+--------+--------+--------+------------------+--------+ CCA Distal110     29                                         +----------+--------+--------+--------+------------------+--------+ ICA Prox  45      16                                         +----------+--------+--------+--------+------------------+--------+ ICA Distal65      29                                         +----------+--------+--------+--------+------------------+--------+ ECA       50      10                                         +----------+--------+--------+--------+------------------+--------+ +----------+--------+-------+--------+-------------------+           PSV cm/sEDV cmsDescribeArm Pressure (mmHG) +----------+--------+-------+--------+-------------------+ [id number]                                         +----------+--------+-------+--------+-------------------+ +---------+--------+--+--------+--+ VertebralPSV cm/s82EDV cm/s29 +---------+--------+--+--------+--+  Left Carotid Findings:  +----------+--------+--------+--------+------------------+--------+           PSV cm/sEDV cm/sStenosisPlaque DescriptionComments +----------+--------+--------+--------+------------------+--------+ CCA Prox  133     36                                         +----------+--------+--------+--------+------------------+--------+  CCA Distal72      23                                         +----------+--------+--------+--------+------------------+--------+ ICA Prox  59      28                                         +----------+--------+--------+--------+------------------+--------+ ICA Distal95      38                                         +----------+--------+--------+--------+------------------+--------+ ECA       62      17                                         +----------+--------+--------+--------+------------------+--------+ +----------+--------+--------+--------+-------------------+           PSV cm/sEDV cm/sDescribeArm Pressure (mmHG) +----------+--------+--------+--------+-------------------+ [id number]                                          +----------+--------+--------+--------+-------------------+ +---------+--------+--+--------+-+ VertebralPSV cm/s13EDV cm/s4 +---------+--------+--+--------+-+     Preliminary         Scheduled Meds: . abiraterone acetate  1,000 mg Oral Daily  . [START ON 02/25/2020] enoxaparin (LOVENOX) injection  40 mg Subcutaneous Daily  . furosemide  20 mg Oral Daily  . metoprolol succinate  12.5 mg Oral Q supper  . pantoprazole (PROTONIX) IV  40 mg Intravenous Q12H  . [START ON 02/26/2020] peg 3350 powder  0.5 kit Oral Once   And  . [START ON 02/26/2020] peg 3350 powder  0.5 kit Oral Once  . predniSONE  5 mg Oral Q breakfast  . rosuvastatin  40 mg Oral Daily  . sacubitril-valsartan  1 tablet Oral BID  . tamsulosin  0.4 mg Oral QPC supper  . venlafaxine XR  37.5 mg Oral Q breakfast   Continuous  Infusions:   LOS: 0 days        Hosie Poisson, MD [facility]   To contact the attending provider between 7A-7P or the covering provider during after hours 7P-7A, please log into the web site [email or web address] and access using universal [place] password for that web site. If you do not have the password, please call the hospital operator.  02/24/2020, 3:20 PM

## 2020-02-24 NOTE — Progress Notes (Signed)
Cardiology Progress Note  Patient ID: Tupac Jeffus MRN: [id number] DOB: 1960-11-19 Date of Encounter: 02/24/2020  Primary Cardiologist: Glenetta Hew, MD  Subjective   Chief Complaint: None.  HPI: Will undergo EGD on Monday.  No further chest pain.  ROS:  All other ROS reviewed and negative. Pertinent positives noted in the HPI.     Inpatient Medications  Scheduled Meds: . abiraterone acetate  1,000 mg Oral Daily  . enoxaparin (LOVENOX) injection  40 mg Subcutaneous Daily  . furosemide  20 mg Oral Daily  . metoprolol succinate  12.5 mg Oral Q supper  . pantoprazole (PROTONIX) IV  40 mg Intravenous Q12H  . predniSONE  5 mg Oral Q breakfast  . rosuvastatin  40 mg Oral Daily  . sacubitril-valsartan  1 tablet Oral BID  . tamsulosin  0.4 mg Oral QPC supper  . venlafaxine XR  37.5 mg Oral Q breakfast   Continuous Infusions:  PRN Meds: acetaminophen, prochlorperazine   Vital Signs   Vitals:   02/23/20 2017 02/24/20 0027 02/24/20 0424 02/24/20 0725  BP: (!) 139/91 (!) 144/89 (!) 127/99 (!) 133/98  Pulse: 92 78 90 (!) 105  Resp: 16 18 16 18   Temp: 98.7 F (37.1 C) 98.7 F (37.1 C) 98.3 F (36.8 C) 98.6 F (37 C)  TempSrc: Oral Oral Oral Oral  SpO2: 98% 99% 98% 98%  Weight:   65.6 kg   Height:        Intake/Output Summary (Last 24 hours) at 02/24/2020 1044 Last data filed at 02/24/2020 0826 Gross per 24 hour  Intake 120 ml  Output --  Net 120 ml   Last 3 Weights 02/24/2020 02/23/2020 02/22/2020  Weight (lbs) 144 lb 10 oz 144 lb 8 oz 152 lb  Weight (kg) 65.6 kg 65.545 kg 68.947 kg      Telemetry  Overnight telemetry shows sinus rhythm heart rate in the 90s, which I personally reviewed.   ECG  The most recent ECG shows sinus tachycardia, heart rate 115, left bundle branch block, which I personally reviewed.   Physical Exam   Vitals:   02/23/20 2017 02/24/20 0027 02/24/20 0424 02/24/20 0725  BP: (!) 139/91 (!) 144/89 (!) 127/99 (!) 133/98  Pulse: 92 78  90 (!) 105  Resp: 16 18 16 18   Temp: 98.7 F (37.1 C) 98.7 F (37.1 C) 98.3 F (36.8 C) 98.6 F (37 C)  TempSrc: Oral Oral Oral Oral  SpO2: 98% 99% 98% 98%  Weight:   65.6 kg   Height:         Intake/Output Summary (Last 24 hours) at 02/24/2020 1044 Last data filed at 02/24/2020 0826 Gross per 24 hour  Intake 120 ml  Output --  Net 120 ml    Last 3 Weights 02/24/2020 02/23/2020 02/22/2020  Weight (lbs) 144 lb 10 oz 144 lb 8 oz 152 lb  Weight (kg) 65.6 kg 65.545 kg 68.947 kg    Body mass index is 19.61 kg/m.  General: Well nourished, well developed, in no acute distress Head: Atraumatic, normal size  Eyes: PEERLA, EOMI  Neck: Supple, no JVD Endocrine: No thryomegaly Cardiac: Normal S1, S2; RRR; no murmurs, rubs, or gallops Lungs: Clear to auscultation bilaterally, no wheezing, rhonchi or rales  Abd: Soft, nontender, no hepatomegaly  Ext: No edema, pulses 2+ Musculoskeletal: No deformities, BUE and BLE strength normal and equal Skin: Warm and dry, no rashes   Neuro: Alert and oriented to person, place, time, and situation, CNII-XII grossly intact, no focal  deficits  Psych: Normal mood and affect   Labs  High Sensitivity Troponin:   Recent Labs  Lab 02/22/20 2137 02/23/20 0102 02/23/20 0251 02/23/20 0413  TROPONINIHS 42* 61* 68* 58*     Cardiac EnzymesNo results for input(s): TROPONINI in the last 168 hours. No results for input(s): TROPIPOC in the last 168 hours.  Chemistry Recent Labs  Lab 02/22/20 2137 02/23/20 0251 02/23/20 0413 02/24/20 0249  NA 133*  --  135 133*  K 4.2  --  3.7 3.5  CL 96*  --  96* 98  CO2 27  --  27 26  GLUCOSE 118*  --  97 100*  BUN 9  --  10 5*  CREATININE 1.02 0.84 0.80 0.84  CALCIUM 9.9  --  9.8 9.5  GFRNONAA >60 >60 >60 >60  ANIONGAP 10  --  12 9    Hematology Recent Labs  Lab 02/22/20 2137 02/23/20 0251 02/23/20 1554 02/24/20 0249  WBC 6.8 5.3  --  5.8  RBC 4.02* 3.88*  --  3.61*  HGB 10.6* 10.4* 10.4* 10.1*   HCT 33.2* 31.8* 29.7* 29.1*  MCV 82.6 82.0  --  80.6  MCH 26.4 26.8  --  28.0  MCHC 31.9 32.7  --  34.7  RDW 16.6* 16.7*  --  16.3*  PLT 319 301  --  291   BNPNo results for input(s): BNP, PROBNP in the last 168 hours.  DDimer No results for input(s): DDIMER in the last 168 hours.   Radiology  DG Chest 2 View  Result Date: 02/22/2020 CLINICAL DATA:  Chest pain EXAM: CHEST - 2 VIEW COMPARISON:  11/03/2018 FINDINGS: Heart and mediastinal contours are within normal limits. No focal opacities or effusions. No acute bony abnormality. IMPRESSION: No active cardiopulmonary disease. Electronically Signed   By: Rolm Baptise M.D.   On: 02/22/2020 21:50   CT HEAD WO CONTRAST  Result Date: 02/23/2020 CLINICAL DATA:  Right arm numbness. EXAM: CT HEAD WITHOUT CONTRAST TECHNIQUE: Contiguous axial images were obtained from the base of the skull through the vertex without intravenous contrast. COMPARISON:  11/03/2018 FINDINGS: Brain: No acute intracranial abnormality. Specifically, no hemorrhage, hydrocephalus, mass lesion, acute infarction, or significant intracranial injury. Vascular: No hyperdense vessel or unexpected calcification. Skull: No acute calvarial abnormality. Sinuses/Orbits: Visualized paranasal sinuses and mastoids clear. Orbital soft tissues unremarkable. Other: None IMPRESSION: Normal study. Electronically Signed   By: Rolm Baptise M.D.   On: 02/23/2020 00:44   ECHOCARDIOGRAM COMPLETE  Result Date: 02/23/2020    ECHOCARDIOGRAM REPORT   Patient Name:   AHAN EISENBERGER Date of Exam: 02/23/2020 Medical Rec #:  [id number]    Height:       72.0 in Accession #:    [id number]   Weight:       152.0 lb Date of Birth:  April 24, 1960   BSA:          1.896 m Patient Age:    59 years     BP:           105/81 mmHg Patient Gender: M            HR:           96 bpm. Exam Location:  Inpatient Procedure: 2D Echo, Cardiac Doppler and Color Doppler Indications:    R07.9* Chest pain, unspecified  History:         Patient has prior history of Echocardiogram examinations, most  recent 11/04/2018. Cardiomyopathy, Previous Myocardial Infarction                 and CAD, TIA and Stroke, Arrythmias:LBBB,                 Signs/Symptoms:Dyspnea; Risk Factors:Hypertension.  Sonographer:    Bernadene Person RDCS Referring Phys: [id number] Mina  1. Akinesis of the distal anteroseptal and apical walls with overall moderate to severe LV dysfunction.  2. Left ventricular ejection fraction, by estimation, is 30 to 35%. The left ventricle has moderate to severely decreased function. The left ventricle demonstrates regional wall motion abnormalities (see scoring diagram/findings for description). Left ventricular diastolic parameters are consistent with Grade II diastolic dysfunction (pseudonormalization).  3. Right ventricular systolic function is normal. The right ventricular size is normal.  4. The mitral valve is normal in structure. No evidence of mitral valve regurgitation. No evidence of mitral stenosis.  5. The aortic valve has an indeterminant number of cusps. Aortic valve regurgitation is trivial. No aortic stenosis is present.  6. The inferior vena cava is normal in size with greater than 50% respiratory variability, suggesting right atrial pressure of 3 mmHg. FINDINGS  Left Ventricle: Left ventricular ejection fraction, by estimation, is 30 to 35%. The left ventricle has moderate to severely decreased function. The left ventricle demonstrates regional wall motion abnormalities. The left ventricular internal cavity size was normal in size. There is no left ventricular hypertrophy. Abnormal (paradoxical) septal motion, consistent with left bundle branch block. Left ventricular diastolic parameters are consistent with Grade II diastolic dysfunction (pseudonormalization). Right Ventricle: The right ventricular size is normal. Right ventricular systolic function is normal. Left Atrium: Left atrial size was  normal in size. Right Atrium: Right atrial size was normal in size. Pericardium: There is no evidence of pericardial effusion. Mitral Valve: The mitral valve is normal in structure. No evidence of mitral valve regurgitation. No evidence of mitral valve stenosis. Tricuspid Valve: The tricuspid valve is normal in structure. Tricuspid valve regurgitation is trivial. No evidence of tricuspid stenosis. Aortic Valve: The aortic valve has an indeterminant number of cusps. Aortic valve regurgitation is trivial. No aortic stenosis is present. Pulmonic Valve: The pulmonic valve was not well visualized. Pulmonic valve regurgitation is not visualized. No evidence of pulmonic stenosis. Aorta: The aortic root is normal in size and structure. Venous: The inferior vena cava is normal in size with greater than 50% respiratory variability, suggesting right atrial pressure of 3 mmHg. IAS/Shunts: No atrial level shunt detected by color flow Doppler. Additional Comments: Akinesis of the distal anteroseptal and apical walls with overall moderate to severe LV dysfunction.  LEFT VENTRICLE PLAX 2D LVIDd:         4.30 cm  Diastology LVIDs:         3.10 cm  LV e' medial:    6.85 cm/s LV PW:         0.90 cm  LV E/e' medial:  14.6 LV IVS:        1.00 cm  LV e' lateral:   9.32 cm/s LVOT diam:     2.10 cm  LV E/e' lateral: 10.7 LV SV:         94 LV SV Index:   49 LVOT Area:     3.46 cm  RIGHT VENTRICLE RV S prime:     10.10 cm/s TAPSE (M-mode): 2.2 cm LEFT ATRIUM             Index  RIGHT ATRIUM           Index LA diam:        3.00 cm 1.58 cm/m  RA Area:     11.90 cm LA Vol (A2C):   24.6 ml 12.97 ml/m RA Volume:   24.40 ml  12.87 ml/m LA Vol (A4C):   19.2 ml 10.13 ml/m LA Biplane Vol: 22.6 ml 11.92 ml/m  AORTIC VALVE LVOT Vmax:   169.00 cm/s LVOT Vmean:  116.000 cm/s LVOT VTI:    0.270 m  AORTA Ao Root diam: 3.00 cm Ao Asc diam:  2.90 cm MITRAL VALVE MV Area (PHT): 6.83 cm     SHUNTS MV Decel Time: 111 msec     Systemic VTI:  0.27 m  MV E velocity: 100.00 cm/s  Systemic Diam: 2.10 cm MV A velocity: 63.40 cm/s MV E/A ratio:  1.58 Kirk Ruths MD Electronically signed by Kirk Ruths MD Signature Date/Time: 02/23/2020/11:30:22 AM    Final    VAS US CAROTID  Result Date: 02/23/2020 Carotid Arterial Duplex Study Indications:       Syncope. Risk Factors:      Hypertension, hyperlipidemia, current smoker, coronary artery                    disease. Other Factors:     Prostate cancer, ischemic cardiomyopathy, Warthin's tumor,                    right neck. Comparison Study:  Prior study indicating 1-39% ICA stenosis, from 07/07/14, is                    available for comparison Performing Technologist: Sharion Dove RVS  Examination Guidelines: A complete evaluation includes B-mode imaging, spectral Doppler, color Doppler, and power Doppler as needed of all accessible portions of each vessel. Bilateral testing is considered an integral part of a complete examination. Limited examinations for reoccurring indications may be performed as noted.  Right Carotid Findings: +----------+--------+--------+--------+------------------+--------+           PSV cm/sEDV cm/sStenosisPlaque DescriptionComments +----------+--------+--------+--------+------------------+--------+ CCA Prox  122     29                                         +----------+--------+--------+--------+------------------+--------+ CCA Distal110     29                                         +----------+--------+--------+--------+------------------+--------+ ICA Prox  45      16                                         +----------+--------+--------+--------+------------------+--------+ ICA Distal65      29                                         +----------+--------+--------+--------+------------------+--------+ ECA       50      10                                         +----------+--------+--------+--------+------------------+--------+  +----------+--------+-------+--------+-------------------+  PSV cm/sEDV cmsDescribeArm Pressure (mmHG) +----------+--------+-------+--------+-------------------+ [id number]                                         +----------+--------+-------+--------+-------------------+ +---------+--------+--+--------+--+ VertebralPSV cm/s82EDV cm/s29 +---------+--------+--+--------+--+  Left Carotid Findings: +----------+--------+--------+--------+------------------+--------+           PSV cm/sEDV cm/sStenosisPlaque DescriptionComments +----------+--------+--------+--------+------------------+--------+ CCA Prox  133     36                                         +----------+--------+--------+--------+------------------+--------+ CCA Distal72      23                                         +----------+--------+--------+--------+------------------+--------+ ICA Prox  59      28                                         +----------+--------+--------+--------+------------------+--------+ ICA Distal95      38                                         +----------+--------+--------+--------+------------------+--------+ ECA       62      17                                         +----------+--------+--------+--------+------------------+--------+ +----------+--------+--------+--------+-------------------+           PSV cm/sEDV cm/sDescribeArm Pressure (mmHG) +----------+--------+--------+--------+-------------------+ [id number]                                          +----------+--------+--------+--------+-------------------+ +---------+--------+--+--------+-+ VertebralPSV cm/s13EDV cm/s4 +---------+--------+--+--------+-+     Preliminary     Cardiac Studies  TTE 02/23/2020 1. Akinesis of the distal anteroseptal and apical walls with overall  moderate to severe LV dysfunction.  2. Left ventricular ejection fraction, by estimation, is 30 to  35%. The  left ventricle has moderate to severely decreased function. The left  ventricle demonstrates regional wall motion abnormalities (see scoring  diagram/findings for description). Left  ventricular diastolic parameters are consistent with Grade II diastolic  dysfunction (pseudonormalization).  3. Right ventricular systolic function is normal. The right ventricular  size is normal.  4. The mitral valve is normal in structure. No evidence of mitral valve  regurgitation. No evidence of mitral stenosis.  5. The aortic valve has an indeterminant number of cusps. Aortic valve  regurgitation is trivial. No aortic stenosis is present.  6. The inferior vena cava is normal in size with greater than 50%  respiratory variability, suggesting right atrial pressure of 3 mmHg.   NM Stress 9/28/201  The left ventricular ejection fraction is moderately decreased (30-44%).  Nuclear stress EF: 35%.  Defect 1: There is a large defect of moderate severity present in the mid anterior, mid anteroseptal, apical anterior, apical septal, apical  inferior and apex location.  Findings consistent with prior myocardial infarction. No ischemia.  This is an intermediate risk study due to reduced systolic function.    Patient Profile  [place] is a 59 y.o. male with chronic systolic heart failure (EF 35%), CAD status post STEMI with PCI to the proximal LAD in 2019, hypertension, hyperlipidemia, stroke, for which block, history of aortic thrombus, metastatic prostate cancer who was admitted on 02/23/2020 with dizziness and chest pain.  Assessment & Plan   1.  Chest pain/atypical -Admitted with atypical chest pain.  EKG with left bundle branch block and unchanged.  Troponins are mildly elevated and flat.  This is not surprising given his low EF. -Recent nuclear medicine stress test in September with prior LAD infarct.  No evidence of ischemia. -Echo shows EF is unchanged.  Prior wall motion normality  unchanged as well. -He was admitted with concerns for GI bleed.  Reports melena.  Also has dizziness and lightheadedness upon standing.  This could have also resulted in a mildly elevated troponin. -Continue to hold Plavix.  Resume at the discretion of gastroenterology.  Apparently he will have to have his EGD done on Monday. -No further cardiac work-up needed.  2.  Ischemic cardiomyopathy, EF 35% -Resume home medications. -He is euvolemic.  Can just resume his oral diuretic.  [facility] will sign off.   Medication Recommendations: Hold Plavix.  Further GI work-up.  Resume once you are able.  Continue heart failure medications. Other recommendations (labs, testing, etc): None Follow up as an outpatient: He may keep his regularly scheduled follow-up appointment  For questions or updates, please contact [facility] Please consult [email or web address] for contact info under   Time Spent with Patient: I have spent a total of 15 minutes with patient reviewing hospital notes, telemetry, EKGs, labs and examining the patient as well as establishing an assessment and plan that was discussed with the patient.  > 50% of time was spent in direct patient care.    Signed, Addison Naegeli. Audie Box, [place]  02/24/2020 10:44 AM

## 2020-02-25 LAB — CBC
HCT: 29.7 % — ABNORMAL LOW (ref 39.0–52.0)
Hemoglobin: 9.6 g/dL — ABNORMAL LOW (ref 13.0–17.0)
MCH: 26.7 pg (ref 26.0–34.0)
MCHC: 32.3 g/dL (ref 30.0–36.0)
MCV: 82.5 fL (ref 80.0–100.0)
Platelets: 290 10*3/uL (ref 150–400)
RBC: 3.6 MIL/uL — ABNORMAL LOW (ref 4.22–5.81)
RDW: 16.2 % — ABNORMAL HIGH (ref 11.5–15.5)
WBC: 7.5 10*3/uL (ref 4.0–10.5)
nRBC: 0 % (ref 0.0–0.2)

## 2020-02-25 LAB — BASIC METABOLIC PANEL
Anion gap: 10 (ref 5–15)
BUN: 9 mg/dL (ref 6–20)
CO2: 27 mmol/L (ref 22–32)
Calcium: 9.3 mg/dL (ref 8.9–10.3)
Chloride: 97 mmol/L — ABNORMAL LOW (ref 98–111)
Creatinine, Ser: 0.79 mg/dL (ref 0.61–1.24)
GFR, Estimated: >60 mL/min (ref >60)
Glucose, Bld: 106 mg/dL — ABNORMAL HIGH (ref 70–99)
Potassium: 3.7 mmol/L (ref 3.5–5.1)
Sodium: 134 mmol/L — ABNORMAL LOW (ref 135–145)

## 2020-02-25 LAB — MAGNESIUM: Magnesium: 1.9 mg/dL (ref 1.7–2.4)

## 2020-02-25 NOTE — Progress Notes (Signed)
PROGRESS NOTE    Daniel Weiss  QMG:[id number] DOB: [birth year] DOA: 02/22/2020 PCP: Azzie Glatter, FNP    Chief Complaint  Patient presents with  . Chest Pain    Brief Narrative:  59 year old gentleman prior history of chronic systolic and diastolic heart failure with left ventricular ejection fraction of 35%, coronary artery disease s/p PCI to LAD/2019, ischemic cardiomyopathy, CVA, hypertension, hyperlipidemia, metastatic prostate cancer presents to ED for chest pain and lightheadedness. He was found to have rectal bleeding and guaiac positive stools.  His EKG does not show any ischemic changes.  Troponins are mildly elevated. Currently patient is chest pain free.  Cardiology consulted to see if he needs further evaluation.  GI consulted for rectal bleeding.  Assessment & Plan:   Active Problems:   Chest pain, rule out acute myocardial infarction   Rectal bleeding   Absolute anemia   Acute blood loss anemia   GI bleed   Atypical chest pain elevated troponins, EKG shows left bundle branch block and is unchanged, no evidence of ischemia.  Echocardiogram left ventricle ejection fraction of 35%.  Cardiology consulted and no further work-up needed at this time.  Patient currently denies any chest pain.   Rectal bleeding, associated with some dizziness and lightheadedness Guaiac positive stools present on admission. GI consulted and he is scheduled for EGD and colonoscopy on 02/27/2020 Continue with IV PPI twice daily, hold Plavix. No rectal bleeding in the last 24 hours   Chronic systolic diastolic heart failure He appears to be compensated at this time. Left ventricular ejection fraction around 35% Continue with Lasix, Entresto, metoprolol and Plavix on hold for rectal bleeding.     History of coronary artery disease s/p PCI to LAD in 2019, ischemic cardiomyopathy No chest pain the last 2 days.   History of CVA Continue with Crestor and Plavix on hold for rectal  bleeding and plan to start Plavix at the discretion of GI after EGD and colonoscopy.   Essential hypertension Blood pressure parameters are optimal   DVT prophylaxis: (scd's) Code Status: (Full code) Family Communication: none at bedside.  Disposition:   Status is: Observation  The patient will require care spanning > 2 midnights and should be moved to inpatient because: Ongoing diagnostic testing needed not appropriate for outpatient work up and Unsafe d/c plan  Dispo:  Patient From: Home  Planned Disposition: Home  Expected discharge date: 02/27/2020  Medically stable for discharge: No   Consultants:   Gastroenterology.   Cardiology   Procedures: EGD and colonoscopy to be scheduled on 02/27/2020   Antimicrobials: none.    Subjective: No chest pain or shortness of breath, no rectal bleeding  Objective: Vitals:   02/24/20 1958 02/25/20 0438 02/25/20 0735 02/25/20 1355  BP: 96/67 126/76 119/71 118/88  Pulse: 95 85 76 79  Resp: _0 Temp: 98.5 F (36.9 C) 98.6 F (37 C) 98.5 F (36.9 C) 98.6 F (37 C)  TempSrc: Oral Oral Oral Oral  SpO2: 99% 99% 100% 100%  Weight:  65.5 kg    Height:        Intake/Output Summary (Last 24 hours) at 02/25/2020 1510 Last data filed at 02/25/2020 1300 Gross per 24 hour  Intake 1077 ml  Output 600 ml  Net 477 ml   Filed Weights   02/23/20 1453 02/24/20 0424 02/25/20 0438  Weight: 65.5 kg 65.6 kg 65.5 kg    Examination:  General exam: Alert and comfortable not in any distress Respiratory system:  Clear to auscultation bilaterally, no wheezing or rhonchi Cardiovascular system: S1-S2 heard, regular rate rhythm, no JVD, no pedal edema Gastrointestinal system: Abdomen is soft, nontender bowel sounds normal Central nervous system: Alert and oriented, grossly nonfocal  extremities: No pedal edema  skin: No rashes seen psychiatry: Mood is appropriate    Data Reviewed: I have personally reviewed following labs  and imaging studies  CBC: Recent Labs  Lab 02/22/20 2137 02/23/20 0251 02/23/20 1554 02/24/20 0249 02/25/20 0326  WBC 6.8 5.3  --  5.8 7.5  HGB 10.6* 10.4* 10.4* 10.1* 9.6*  HCT 33.2* 31.8* 29.7* 29.1* 29.7*  MCV 82.6 82.0  --  80.6 82.5  PLT 319 301  --  291 859    Basic Metabolic Panel: Recent Labs  Lab 02/22/20 2137 02/23/20 0251 02/23/20 0413 02/23/20 1554 02/24/20 0249 02/25/20 0326  NA 133*  --  135  --  133* 134*  K 4.2  --  3.7  --  3.5 3.7  CL 96*  --  96*  --  98 97*  CO2 27  --  27  --  26 27  GLUCOSE 118*  --  97  --  100* 106*  BUN 9  --  10  --  5* 9  CREATININE 1.02 0.84 0.80  --  0.84 0.79  CALCIUM 9.9  --  9.8  --  9.5 9.3  MG  --   --   --  1.8 1.9 1.9    GFR: Estimated Creatinine Clearance: 91 mL/min (by C-G formula based on SCr of 0.79 mg/dL).  Liver Function Tests: No results for input(s): AST, ALT, ALKPHOS, BILITOT, PROT, ALBUMIN in the last 168 hours.  CBG: No results for input(s): GLUCAP in the last 168 hours.   Recent Results (from the past 240 hour(s))  Resp Panel by RT-PCR (Flu A&B, Covid) Nasopharyngeal Swab     Status: None   Collection Time: 02/23/20  2:02 AM   Specimen: Nasopharyngeal Swab; Nasopharyngeal(NP) swabs in vial transport medium  Result Value Ref Range Status   SARS Coronavirus 2 by RT PCR NEGATIVE NEGATIVE Final    Comment: (NOTE) SARS-CoV-2 target nucleic acids are NOT DETECTED.  The SARS-CoV-2 RNA is generally detectable in upper respiratory specimens during the acute phase of infection. The lowest concentration of SARS-CoV-2 viral copies this assay can detect is 138 copies/mL. A negative result does not preclude SARS-Cov-2 infection and should not be used as the sole basis for treatment or other patient management decisions. A negative result may occur with  improper specimen collection/handling, submission of specimen other than nasopharyngeal swab, presence of viral mutation(s) within the areas targeted by  this assay, and inadequate number of viral copies(<138 copies/mL). A negative result must be combined with clinical observations, patient history, and epidemiological information. The expected result is Negative.  Fact Sheet for Patients:  EntrepreneurPulse.com.au  Fact Sheet for Healthcare Providers:  IncredibleEmployment.be  This test is no t yet approved or cleared by the [place] FDA and  has been authorized for detection and/or diagnosis of SARS-CoV-2 by FDA under an Emergency Use Authorization (EUA). This EUA will remain  in effect (meaning this test can be used) for the duration of the COVID-19 declaration under Section 564(b)(1) of the Act, 21 U.S.C.section 360bbb-3(b)(1), unless the authorization is terminated  or revoked sooner.       Influenza A by PCR NEGATIVE NEGATIVE Final   Influenza B by PCR NEGATIVE NEGATIVE Final    Comment: (NOTE) The  Xpert Xpress SARS-CoV-2/FLU/RSV plus assay is intended as an aid in the diagnosis of influenza from Nasopharyngeal swab specimens and should not be used as a sole basis for treatment. Nasal washings and aspirates are unacceptable for Xpert Xpress SARS-CoV-2/FLU/RSV testing.  Fact Sheet for Patients: EntrepreneurPulse.com.au  Fact Sheet for Healthcare Providers: IncredibleEmployment.be  This test is not yet approved or cleared by the [place] FDA and has been authorized for detection and/or diagnosis of SARS-CoV-2 by FDA under an Emergency Use Authorization (EUA). This EUA will remain in effect (meaning this test can be used) for the duration of the COVID-19 declaration under Section 564(b)(1) of the Act, 21 U.S.C. section 360bbb-3(b)(1), unless the authorization is terminated or revoked.  Performed at [facility], [place] [address], [place], [place] [address]          Radiology Studies: No results found.      Scheduled  Meds: . abiraterone acetate  1,000 mg Oral Daily  . enoxaparin (LOVENOX) injection  40 mg Subcutaneous Daily  . furosemide  20 mg Oral Daily  . metoprolol succinate  12.5 mg Oral Q supper  . pantoprazole (PROTONIX) IV  40 mg Intravenous Q12H  . [START ON 02/26/2020] peg 3350 powder  0.5 kit Oral Once   And  . [START ON 02/26/2020] peg 3350 powder  0.5 kit Oral Once  . predniSONE  5 mg Oral Q breakfast  . rosuvastatin  40 mg Oral Daily  . sacubitril-valsartan  1 tablet Oral BID  . tamsulosin  0.4 mg Oral QPC supper  . venlafaxine XR  37.5 mg Oral Q breakfast   Continuous Infusions:   LOS: 1 day        Hosie Poisson, MD [facility]   To contact the attending provider between 7A-7P or the covering provider during after hours 7P-7A, please log into the web site [email or web address] and access using universal [place] password for that web site. If you do not have the password, please call the hospital operator.  02/25/2020, 3:10 PM

## 2020-02-26 NOTE — Plan of Care (Signed)
  Problem: Activity: Goal: Risk for activity intolerance will decrease Outcome: Completed/Met   Problem: Elimination: Goal: Will not experience complications related to urinary retention Outcome: Completed/Met   Problem: Safety: Goal: Ability to remain free from injury will improve Outcome: Completed/Met   Problem: Skin Integrity: Goal: Risk for impaired skin integrity will decrease Outcome: Completed/Met

## 2020-02-26 NOTE — Anesthesia Preprocedure Evaluation (Addendum)
Anesthesia Evaluation  Patient identified by MRN, date of birth, ID band Patient awake    Reviewed: Allergy & Precautions, NPO status , Patient's Chart, lab work & pertinent test results  History of Anesthesia Complications Negative for: history of anesthetic complications  Airway Mallampati: II  TM Distance: >3 FB Neck ROM: Full    Dental  (+) Missing,    Pulmonary Current Smoker and Patient abstained from smoking.,    Pulmonary exam normal        Cardiovascular hypertension, Pt. on home beta blockers and Pt. on medications + CAD, + Past MI, + Cardiac Stents (2019) and +CHF  Normal cardiovascular exam  LBBB  TTE 02/23/20: Akinesis of distal anteroseptal and apical walls, EF 30-35%, grade II DD, valves ok   Neuro/Psych  Headaches, TIACVA negative psych ROS   GI/Hepatic (+)     substance abuse  marijuana use, Hematochezia   Endo/Other  negative endocrine ROS  Renal/GU Renal disease   Prostate ca    Musculoskeletal negative musculoskeletal ROS (+)   Abdominal   Peds  Hematology  (+) anemia , Hgb 9.6   Anesthesia Other Findings Day of surgery medications reviewed with patient.  Reproductive/Obstetrics negative OB ROS                            Anesthesia Physical Anesthesia Plan  ASA: IV  Anesthesia Plan: MAC   Post-op Pain Management:    Induction:   PONV Risk Score and Plan: Treatment may vary due to age or medical condition and Propofol infusion  Airway Management Planned: Nasal Cannula and Natural Airway  Additional Equipment: None  Intra-op Plan:   Post-operative Plan:   Informed Consent: I have reviewed the patients History and Physical, chart, labs and discussed the procedure including the risks, benefits and alternatives for the proposed anesthesia with the patient or authorized representative who has indicated his/her understanding and acceptance.     Dental  advisory given  Plan Discussed with: CRNA  Anesthesia Plan Comments:        Anesthesia Quick Evaluation

## 2020-02-26 NOTE — Progress Notes (Signed)
PROGRESS NOTE    Daniel Weiss  LDJ:[id number] DOB: [birth year] DOA: 02/22/2020 PCP: Azzie Glatter, FNP    Chief Complaint  Patient presents with  . Chest Pain    Brief Narrative:  59 year old gentleman prior history of chronic systolic and diastolic heart failure with left ventricular ejection fraction of 35%, coronary artery disease s/p PCI to LAD/2019, ischemic cardiomyopathy, CVA, hypertension, hyperlipidemia, metastatic prostate cancer presents to ED for chest pain and lightheadedness. He was found to have rectal bleeding and guaiac positive stools.  His EKG does not show any ischemic changes.  Troponins were mildly elevated  -Cardiology recommended medical management GI consulted for rectal bleeding.  Assessment & Plan:  Atypical chest pain - Mildly elevated troponins, flat trend - EKG shows left bundle branch block and is unchanged - Echocardiogram left ventricle ejection fraction of 35% unchanged from recent past, recent stress test in November with prior LAD infarct, no evidence of ischemia,  -Plavix on hold for EGD/colonoscopy tomorrow  CAD Ischemic cardiomyopathy EF of 35% -Clinically compensated and euvolemic, history of PCI to LAD in 2019 -Continue Entresto, Lasix, metoprolol -Plavix on hold as discussed above  Hematochezia Occasional hematemesis -Plavix held -Gastroenterology consulting, plan for EGD/colonoscopy on Monday  cardiology consulted and no further work-up needed at this time.  History of CVA Continue with Crestor and Plavix on hold  Essential hypertension Blood pressure parameters are optimal at this time   DVT prophylaxis: Hold Lovenox, SCDs Code Status: Full code Family Communication: none at bedside.  Disposition:   Status is: Inpatient-remains inpatient due to ongoing work-up  Dispo:  Patient From: Home  Planned Disposition: Home  Expected discharge date: 02/28/2020  Medically stable for discharge: No   Consultants:    Gastroenterology.   Cardiology   Procedures: EGD and colonoscopy to be scheduled on 02/27/2020   Antimicrobials: none.    Subjective: -Feels okay, no complaints, awaiting EGD/colonoscopy tomorrow  Objective: Vitals:   02/25/20 1355 02/25/20 1939 02/26/20 0353 02/26/20 1101  BP: 118/88 111/72 106/79 (!) 131/92  Pulse: 79 83 76 75  Resp: _0 Temp: 98.6 F (37 C) 98.2 F (36.8 C) 98.1 F (36.7 C) 98.2 F (36.8 C)  TempSrc: Oral Oral Oral Oral  SpO2: 100% 100% 100% 100%  Weight:   66 kg   Height:        Intake/Output Summary (Last 24 hours) at 02/26/2020 1119 Last data filed at 02/26/2020 0500 Gross per 24 hour  Intake 340 ml  Output 800 ml  Net -460 ml   Filed Weights   02/24/20 0424 02/25/20 0438 02/26/20 0353  Weight: 65.6 kg 65.5 kg 66 kg    Examination:  General exam: Pleasant middle-aged male sitting up in bed, AAOx3, no distress CVS: S1-S2, regular rate rhythm Lungs: Clear bilaterally Abdomen: Soft, nontender, bowel sounds present Extremities: No edema skin: No rashes seen  psychiatry: Mood is appropriate    Data Reviewed: I have personally reviewed following labs and imaging studies  CBC: Recent Labs  Lab 02/22/20 2137 02/23/20 0251 02/23/20 1554 02/24/20 0249 02/25/20 0326  WBC 6.8 5.3  --  5.8 7.5  HGB 10.6* 10.4* 10.4* 10.1* 9.6*  HCT 33.2* 31.8* 29.7* 29.1* 29.7*  MCV 82.6 82.0  --  80.6 82.5  PLT 319 301  --  291 030    Basic Metabolic Panel: Recent Labs  Lab 02/22/20 2137 02/23/20 0251 02/23/20 0413 02/23/20 1554 02/24/20 0249 02/25/20 0326  NA 133*  --  135  --  133* 134*  K 4.2  --  3.7  --  3.5 3.7  CL 96*  --  96*  --  98 97*  CO2 27  --  27  --  26 27  GLUCOSE 118*  --  97  --  100* 106*  BUN 9  --  10  --  5* 9  CREATININE 1.02 0.84 0.80  --  0.84 0.79  CALCIUM 9.9  --  9.8  --  9.5 9.3  MG  --   --   --  1.8 1.9 1.9    GFR: Estimated Creatinine Clearance: 91.7 mL/min (by C-G formula based on SCr  of 0.79 mg/dL).  Liver Function Tests: No results for input(s): AST, ALT, ALKPHOS, BILITOT, PROT, ALBUMIN in the last 168 hours.  CBG: No results for input(s): GLUCAP in the last 168 hours.   Recent Results (from the past 240 hour(s))  Resp Panel by RT-PCR (Flu A&B, Covid) Nasopharyngeal Swab     Status: None   Collection Time: 02/23/20  2:02 AM   Specimen: Nasopharyngeal Swab; Nasopharyngeal(NP) swabs in vial transport medium  Result Value Ref Range Status   SARS Coronavirus 2 by RT PCR NEGATIVE NEGATIVE Final    Comment: (NOTE) SARS-CoV-2 target nucleic acids are NOT DETECTED.  The SARS-CoV-2 RNA is generally detectable in upper respiratory specimens during the acute phase of infection. The lowest concentration of SARS-CoV-2 viral copies this assay can detect is 138 copies/mL. A negative result does not preclude SARS-Cov-2 infection and should not be used as the sole basis for treatment or other patient management decisions. A negative result may occur with  improper specimen collection/handling, submission of specimen other than nasopharyngeal swab, presence of viral mutation(s) within the areas targeted by this assay, and inadequate number of viral copies(<138 copies/mL). A negative result must be combined with clinical observations, patient history, and epidemiological information. The expected result is Negative.  Fact Sheet for Patients:  EntrepreneurPulse.com.au  Fact Sheet for Healthcare Providers:  IncredibleEmployment.be  This test is no t yet approved or cleared by the [place] FDA and  has been authorized for detection and/or diagnosis of SARS-CoV-2 by FDA under an Emergency Use Authorization (EUA). This EUA will remain  in effect (meaning this test can be used) for the duration of the COVID-19 declaration under Section 564(b)(1) of the Act, 21 U.S.C.section 360bbb-3(b)(1), unless the authorization is terminated  or  revoked sooner.       Influenza A by PCR NEGATIVE NEGATIVE Final   Influenza B by PCR NEGATIVE NEGATIVE Final    Comment: (NOTE) The Xpert Xpress SARS-CoV-2/FLU/RSV plus assay is intended as an aid in the diagnosis of influenza from Nasopharyngeal swab specimens and should not be used as a sole basis for treatment. Nasal washings and aspirates are unacceptable for Xpert Xpress SARS-CoV-2/FLU/RSV testing.  Fact Sheet for Patients: EntrepreneurPulse.com.au  Fact Sheet for Healthcare Providers: IncredibleEmployment.be  This test is not yet approved or cleared by the [place] FDA and has been authorized for detection and/or diagnosis of SARS-CoV-2 by FDA under an Emergency Use Authorization (EUA). This EUA will remain in effect (meaning this test can be used) for the duration of the COVID-19 declaration under Section 564(b)(1) of the Act, 21 U.S.C. section 360bbb-3(b)(1), unless the authorization is terminated or revoked.  Performed at [facility], [place] [address], [place], [place] [address]    Scheduled Meds: . abiraterone acetate  1,000 mg Oral Daily  . enoxaparin (LOVENOX) injection  40 mg Subcutaneous Daily  . furosemide  20 mg Oral Daily  . metoprolol succinate  12.5 mg Oral Q supper  . pantoprazole (PROTONIX) IV  40 mg Intravenous Q12H  . peg 3350 powder  0.5 kit Oral Once   And  . peg 3350 powder  0.5 kit Oral Once  . predniSONE  5 mg Oral Q breakfast  . rosuvastatin  40 mg Oral Daily  . sacubitril-valsartan  1 tablet Oral BID  . tamsulosin  0.4 mg Oral QPC supper  . venlafaxine XR  37.5 mg Oral Q breakfast   Continuous Infusions:   LOS: 2 days   Domenic Polite, MD [facility]  02/26/2020, 11:19 AM

## 2020-02-26 NOTE — Plan of Care (Signed)
  Problem: Coping: Goal: Level of anxiety will decrease Outcome: Completed/Met   Problem: Pain Managment: Goal: General experience of comfort will improve Outcome: Completed/Met   

## 2020-02-27 ENCOUNTER — Inpatient Hospital Stay (HOSPITAL_COMMUNITY): Payer: Medicaid Other | Admitting: Anesthesiology

## 2020-02-27 ENCOUNTER — Encounter (HOSPITAL_COMMUNITY)
Admission: EM | Disposition: A | Discharge status: Home / Self Care | Payer: Self-pay | Source: Home / Self Care | Attending: Internal Medicine

## 2020-02-27 ENCOUNTER — Encounter (HOSPITAL_COMMUNITY): Payer: Self-pay | Admitting: Internal Medicine

## 2020-02-27 DIAGNOSIS — D126 Benign neoplasm of colon, unspecified: Secondary | ICD-10-CM

## 2020-02-27 DIAGNOSIS — K31819 Angiodysplasia of stomach and duodenum without bleeding: Secondary | ICD-10-CM

## 2020-02-27 DIAGNOSIS — K920 Hematemesis: Secondary | ICD-10-CM

## 2020-02-27 DIAGNOSIS — K635 Polyp of colon: Secondary | ICD-10-CM

## 2020-02-27 DIAGNOSIS — K621 Rectal polyp: Secondary | ICD-10-CM

## 2020-02-27 HISTORY — PX: POLYPECTOMY: SHX5525

## 2020-02-27 HISTORY — PX: HOT HEMOSTASIS: SHX5433

## 2020-02-27 HISTORY — PX: HEMOSTASIS CLIP PLACEMENT: SHX6857

## 2020-02-27 HISTORY — PX: ESOPHAGOGASTRODUODENOSCOPY (EGD) WITH PROPOFOL: SHX5813

## 2020-02-27 HISTORY — PX: COLONOSCOPY WITH PROPOFOL: SHX5780

## 2020-02-27 LAB — CBC
HCT: 30.5 % — ABNORMAL LOW (ref 39.0–52.0)
Hemoglobin: 9.8 g/dL — ABNORMAL LOW (ref 13.0–17.0)
MCH: 26.6 pg (ref 26.0–34.0)
MCHC: 32.1 g/dL (ref 30.0–36.0)
MCV: 82.9 fL (ref 80.0–100.0)
Platelets: 325 10*3/uL (ref 150–400)
RBC: 3.68 MIL/uL — ABNORMAL LOW (ref 4.22–5.81)
RDW: 16.5 % — ABNORMAL HIGH (ref 11.5–15.5)
WBC: 6.5 10*3/uL (ref 4.0–10.5)
nRBC: 0 % (ref 0.0–0.2)

## 2020-02-27 LAB — BASIC METABOLIC PANEL
Anion gap: 10 (ref 5–15)
BUN: 5 mg/dL — ABNORMAL LOW (ref 6–20)
CO2: 25 mmol/L (ref 22–32)
Calcium: 9.7 mg/dL (ref 8.9–10.3)
Chloride: 102 mmol/L (ref 98–111)
Creatinine, Ser: 0.74 mg/dL (ref 0.61–1.24)
GFR, Estimated: >60 mL/min (ref >60)
Glucose, Bld: 89 mg/dL (ref 70–99)
Potassium: 3.5 mmol/L (ref 3.5–5.1)
Sodium: 137 mmol/L (ref 135–145)

## 2020-02-27 SURGERY — ESOPHAGOGASTRODUODENOSCOPY (EGD) WITH PROPOFOL
Anesthesia: Monitor Anesthesia Care

## 2020-02-27 MED ORDER — EPHEDRINE SULFATE-NACL 50-0.9 MG/10ML-% IV SOSY
PREFILLED_SYRINGE | INTRAVENOUS | Status: DC | PRN
  Administered 2020-02-27: 10 mg via INTRAVENOUS
  Administered 2020-02-27 (×2): 5 mg via INTRAVENOUS

## 2020-02-27 MED ORDER — PROPOFOL 10 MG/ML IV BOLUS
INTRAVENOUS | Status: DC | PRN
  Administered 2020-02-27 (×2): 20 mg via INTRAVENOUS
  Administered 2020-02-27: 10 mg via INTRAVENOUS
  Administered 2020-02-27 (×2): 20 mg via INTRAVENOUS
  Administered 2020-02-27: 10 mg via INTRAVENOUS
  Administered 2020-02-27 (×2): 20 mg via INTRAVENOUS
  Administered 2020-02-27 (×2): 10 mg via INTRAVENOUS

## 2020-02-27 MED ORDER — SPIRONOLACTONE 12.5 MG HALF TABLET
12.5000 mg | ORAL_TABLET | Freq: Every day | ORAL | Status: DC
  Administered 2020-02-27 – 2020-02-28 (×2): 12.5 mg via ORAL
  Filled 2020-02-27 (×2): qty 1

## 2020-02-27 MED ORDER — SODIUM CHLORIDE 0.9 % IV SOLN: INTRAVENOUS | Status: DC

## 2020-02-27 MED ORDER — PANTOPRAZOLE SODIUM 40 MG PO TBEC
40.0000 mg | DELAYED_RELEASE_TABLET | Freq: Every day | ORAL | Status: DC
  Administered 2020-02-28: 09:00:00 40 mg via ORAL
  Filled 2020-02-27: qty 1

## 2020-02-27 MED ORDER — LACTATED RINGERS IV SOLN: INTRAVENOUS | Status: DC | PRN

## 2020-02-27 MED ORDER — PHENYLEPHRINE HCL-NACL 10-0.9 MG/250ML-% IV SOLN
INTRAVENOUS | Status: DC | PRN
  Administered 2020-02-27: 50 ug/min via INTRAVENOUS

## 2020-02-27 MED ORDER — PROPOFOL 500 MG/50ML IV EMUL
INTRAVENOUS | Status: DC | PRN
  Administered 2020-02-27: 120 ug/kg/min via INTRAVENOUS

## 2020-02-27 MED ORDER — PHENYLEPHRINE 40 MCG/ML (10ML) SYRINGE FOR IV PUSH (FOR BLOOD PRESSURE SUPPORT)
PREFILLED_SYRINGE | INTRAVENOUS | Status: DC | PRN
  Administered 2020-02-27: 120 ug via INTRAVENOUS
  Administered 2020-02-27: 240 ug via INTRAVENOUS
  Administered 2020-02-27 (×2): 120 ug via INTRAVENOUS
  Administered 2020-02-27: 80 ug via INTRAVENOUS

## 2020-02-27 MED ORDER — ONDANSETRON HCL 4 MG/2ML IJ SOLN
INTRAMUSCULAR | Status: DC | PRN
  Administered 2020-02-27: 4 mg via INTRAVENOUS

## 2020-02-27 MED ORDER — LIDOCAINE 2% (20 MG/ML) 5 ML SYRINGE
INTRAMUSCULAR | Status: DC | PRN
  Administered 2020-02-27: 60 mg via INTRAVENOUS
  Administered 2020-02-27: 40 mg via INTRAVENOUS

## 2020-02-27 SURGICAL SUPPLY — 25 items

## 2020-02-27 NOTE — Anesthesia Procedure Notes (Signed)
Procedure Name: MAC Date/Time: 02/27/2020 7:40 AM Performed by: Dorthea Cove, CRNA Pre-anesthesia Checklist: Patient identified, Emergency Drugs available, Suction available, Patient being monitored and Timeout performed Patient Re-evaluated:Patient Re-evaluated prior to induction Oxygen Delivery Method: Nasal cannula Preoxygenation: Pre-oxygenation with 100% oxygen Induction Type: IV induction Dental Injury: Teeth and Oropharynx as per pre-operative assessment

## 2020-02-27 NOTE — Interval H&P Note (Signed)
History and Physical Interval Note: Patient has been evaluated by cardiology, no further cardiac testing has been recommended. Last dose of Plavix 12/15. He has not had any further bleeding symptoms during his inpatient stay. EGD and colonoscopy being done today to further evaluate cause for his anemia / bleeding symptoms now that Plavix has been held for 5 days. He did okay with prep without further bleeding. Have discussed risks / benefits and he wishes to proceed, further recommendations pending the results. All questions answered.   02/27/2020 7:19 AM  [name]  has presented today for surgery, with the diagnosis of Anemia.  The various methods of treatment have been discussed with the patient and family. After consideration of risks, benefits and other options for treatment, the patient has consented to  Procedure(s): ESOPHAGOGASTRODUODENOSCOPY (EGD) WITH PROPOFOL (N/A) COLONOSCOPY WITH PROPOFOL (N/A) as a surgical intervention.  The patient's history has been reviewed, patient examined, no change in status, stable for surgery.  I have reviewed the patient's chart and labs.  Questions were answered to the patient's satisfaction.     [place]

## 2020-02-27 NOTE — Progress Notes (Signed)
PT Cancellation Note  Patient Details Name: Giacomo Valone MRN: [id number] DOB: 1960-12-15   Cancelled Treatment:    Reason Eval/Treat Not Completed: Patient at procedure or test/unavailable (Pt in EGD.  Will return as time allows.)   Denice Paradise 02/27/2020, 9:15 AM Getsemani Lindon W,PT Acute Rehabilitation Services Pager:  [phone]  Office:  [phone]

## 2020-02-27 NOTE — Progress Notes (Signed)
PROGRESS NOTE    Daniel Weiss  KPT:[id number] DOB: 1960-09-28 DOA: 02/22/2020 PCP: Azzie Glatter, FNP    Chief Complaint  Patient presents with  . Chest Pain    Brief Narrative:  59 year old gentleman prior history of chronic systolic and diastolic heart failure with left ventricular ejection fraction of 35%, coronary artery disease s/p PCI to LAD/2019, ischemic cardiomyopathy, CVA, hypertension, hyperlipidemia, metastatic prostate cancer presents to ED for chest pain and lightheadedness. He was found to have rectal bleeding and guaiac positive stools.  His EKG does not show any ischemic changes.  Troponins were mildly elevated  -Cardiology recommended medical management GI consulted for rectal bleeding.  Assessment & Plan:  Atypical chest pain - Mildly elevated troponins, flat trend - EKG shows left bundle branch block and is unchanged -Evaluated by cardiology, echocardiogram left ventricle ejection fraction of 35% unchanged from recent past, recent stress test in November with prior LAD infarct, no evidence of ischemia,  -Plavix on hold for EGD/colonoscopy today  CAD Ischemic cardiomyopathy EF of 35% -Clinically compensated and euvolemic, history of PCI to LAD in 2019 -Continue Entresto, Lasix, metoprolol -Plavix on hold as discussed above  Hematochezia Occasional hematemesis -Plavix held -Gastroenterology consulting, plan for EGD/colonoscopy today  History of CVA Continue with Crestor and Plavix on hold  Essential hypertension Blood pressure parameters are optimal at this time   DVT prophylaxis: SCDs Code Status: Full code Family Communication: none at bedside.  Disposition:   Status is: Inpatient-remains inpatient due to ongoing work-up  Dispo:  Patient From: Home  Planned Disposition: Home  Expected discharge date: 02/28/2020  Medically stable for discharge: No   Consultants:   Gastroenterology.   Cardiology   Procedures: EGD and  colonoscopy   Antimicrobials: none.    Subjective: -Feels okay overall, going for EGD/colonoscopy this morning  Objective: Vitals:   02/27/20 0919 02/27/20 0926 02/27/20 0938 02/27/20 1113  BP: (!) 152/73 (!) 150/82 (!) 162/82 (!) 141/88  Pulse: 80 68 67 91  Resp: (!) 24 (!) 21 16 20   Temp:   (!) 97.4 F (36.3 C) 98.2 F (36.8 C)  TempSrc:   Oral Oral  SpO2: 100% 100% 100% 100%  Weight:      Height:        Intake/Output Summary (Last 24 hours) at 02/27/2020 1235 Last data filed at 02/27/2020 1114 Gross per 24 hour  Intake 2660 ml  Output 2331 ml  Net 329 ml   Filed Weights   02/26/20 0353 02/27/20 0411 02/27/20 0657  Weight: 66 kg 65.1 kg 65.1 kg    Examination:  General exam: Pleasant middle-aged male sitting up in bed, AAOx3, no distress CVS: S1-S2, regular rate rhythm Lungs: Decreased breath sounds the bases otherwise clear Abdomen: Soft, nontender, bowel sounds present Extremities: No edema  skin: No rashes seen  psychiatry: Mood is appropriate    Data Reviewed: I have personally reviewed following labs and imaging studies  CBC: Recent Labs  Lab 02/22/20 2137 02/23/20 0251 02/23/20 1554 02/24/20 0249 02/25/20 0326 02/27/20 0411  WBC 6.8 5.3  --  5.8 7.5 6.5  HGB 10.6* 10.4* 10.4* 10.1* 9.6* 9.8*  HCT 33.2* 31.8* 29.7* 29.1* 29.7* 30.5*  MCV 82.6 82.0  --  80.6 82.5 82.9  PLT 319 301  --  291 290 170    Basic Metabolic Panel: Recent Labs  Lab 02/22/20 2137 02/23/20 0251 02/23/20 0413 02/23/20 1554 02/24/20 0249 02/25/20 0326 02/27/20 0411  NA 133*  --  135  --  133* 134* 137  K 4.2  --  3.7  --  3.5 3.7 3.5  CL 96*  --  96*  --  98 97* 102  CO2 27  --  27  --  26 27 25   GLUCOSE 118*  --  97  --  100* 106* 89  BUN 9  --  10  --  5* 9 5*  CREATININE 1.02 0.84 0.80  --  0.84 0.79 0.74  CALCIUM 9.9  --  9.8  --  9.5 9.3 9.7  MG  --   --   --  1.8 1.9 1.9  --     GFR: Estimated Creatinine Clearance: 90.4 mL/min (by C-G formula  based on SCr of 0.74 mg/dL).  Liver Function Tests: No results for input(s): AST, ALT, ALKPHOS, BILITOT, PROT, ALBUMIN in the last 168 hours.  CBG: No results for input(s): GLUCAP in the last 168 hours.   Recent Results (from the past 240 hour(s))  Resp Panel by RT-PCR (Flu A&B, Covid) Nasopharyngeal Swab     Status: None   Collection Time: 02/23/20  2:02 AM   Specimen: Nasopharyngeal Swab; Nasopharyngeal(NP) swabs in vial transport medium  Result Value Ref Range Status   SARS Coronavirus 2 by RT PCR NEGATIVE NEGATIVE Final    Comment: (NOTE) SARS-CoV-2 target nucleic acids are NOT DETECTED.  The SARS-CoV-2 RNA is generally detectable in upper respiratory specimens during the acute phase of infection. The lowest concentration of SARS-CoV-2 viral copies this assay can detect is 138 copies/mL. A negative result does not preclude SARS-Cov-2 infection and should not be used as the sole basis for treatment or other patient management decisions. A negative result may occur with  improper specimen collection/handling, submission of specimen other than nasopharyngeal swab, presence of viral mutation(s) within the areas targeted by this assay, and inadequate number of viral copies(<138 copies/mL). A negative result must be combined with clinical observations, patient history, and epidemiological information. The expected result is Negative.  Fact Sheet for Patients:  EntrepreneurPulse.com.au  Fact Sheet for Healthcare Providers:  IncredibleEmployment.be  This test is no t yet approved or cleared by the [place] FDA and  has been authorized for detection and/or diagnosis of SARS-CoV-2 by FDA under an Emergency Use Authorization (EUA). This EUA will remain  in effect (meaning this test can be used) for the duration of the COVID-19 declaration under Section 564(b)(1) of the Act, 21 U.S.C.section 360bbb-3(b)(1), unless the authorization is  terminated  or revoked sooner.       Influenza A by PCR NEGATIVE NEGATIVE Final   Influenza B by PCR NEGATIVE NEGATIVE Final    Comment: (NOTE) The Xpert Xpress SARS-CoV-2/FLU/RSV plus assay is intended as an aid in the diagnosis of influenza from Nasopharyngeal swab specimens and should not be used as a sole basis for treatment. Nasal washings and aspirates are unacceptable for Xpert Xpress SARS-CoV-2/FLU/RSV testing.  Fact Sheet for Patients: EntrepreneurPulse.com.au  Fact Sheet for Healthcare Providers: IncredibleEmployment.be  This test is not yet approved or cleared by the [place] FDA and has been authorized for detection and/or diagnosis of SARS-CoV-2 by FDA under an Emergency Use Authorization (EUA). This EUA will remain in effect (meaning this test can be used) for the duration of the COVID-19 declaration under Section 564(b)(1) of the Act, 21 U.S.C. section 360bbb-3(b)(1), unless the authorization is terminated or revoked.  Performed at [facility], [place] [address] [place], [place] [address]    Scheduled Meds: . abiraterone  acetate  1,000 mg Oral Daily  . furosemide  20 mg Oral Daily  . metoprolol succinate  12.5 mg Oral Q supper  . [START ON 02/28/2020] pantoprazole  40 mg Oral Daily  . predniSONE  5 mg Oral Q breakfast  . rosuvastatin  40 mg Oral Daily  . sacubitril-valsartan  1 tablet Oral BID  . tamsulosin  0.4 mg Oral QPC supper  . venlafaxine XR  37.5 mg Oral Q breakfast   Continuous Infusions:   LOS: 3 days   Domenic Polite, MD [facility]  02/27/2020, 12:35 PM

## 2020-02-27 NOTE — Progress Notes (Signed)
Heart Failure Stewardship Pharmacist Progress Note   PCP: Azzie Glatter, FNP PCP-Cardiologist: Glenetta Hew, MD    HPI:  59 yo M with PMH of CHF, CAD s/p PCI to LAD in 2019, CVA, HTN, HLD, and metastatic prostate cancer. He presented to the ED on 02/22/20 for rectal bleeding, chest pain, and lightheadedness. An ECHO was done on 02/23/20 and LVEF was reduced to 30-35% (was 35-40% in 10/2018).   Current HF Medications: Furosemide 20 mg daily Metoprolol XL 12.5 mg daily Entresto 24/26 mg BID  Prior to admission HF Medications: Furosemide 20 mg daily PRN Metoprolol XL 12.5 mg daily Entresto 24/26 mg (one-half tablet) BID  Pertinent Lab Values: . Serum creatinine 0.74, BUN 5, Potassium 3.5, Sodium 137, Magnesium 1.9   Vital Signs: . Weight: 143 lbs (admission weight: 144 lbs) . Blood pressure: 140-170/80s  . Heart rate: 70-100s   Medication Assistance / Insurance Benefits Check: Does the patient have prescription insurance?  Yes Type of insurance plan: [employer] Medicaid  Does the patient qualify for medication assistance through manufacturers or grants?   No  Outpatient Pharmacy:  Prior to admission outpatient pharmacy: [facility] Is the patient willing to use [facility] at discharge? Yes Is the patient willing to transition their outpatient pharmacy to utilize a [facility] outpatient pharmacy?   Pending    Assessment: 1. Acute on chronic systolic CHF (EF 41-66%), due to ICM. NYHA class II symptoms. - Continue furosemide 20 mg daily - Continue metoprolol XL 12.5 mg daily - Continue Entresto 24/26 mg BID - Consider starting spironolactone prior to discharge - Consider starting Farxiga 10 mg daily prior to discharge   Plan: 1) Medication changes recommended at this time: - Start spironolactone 12.5 mg daily  2)  Education  - To be completed prior to discharge  Kerby Nora, PharmD, BCPS Heart Failure Cytogeneticist Phone [phone]

## 2020-02-27 NOTE — Op Note (Signed)
[facility] Patient Name: Fabian Walder Procedure Date : 02/27/2020 MRN: [id number] Attending MD: Carlota Raspberry. Havery Moros , MD Date of Birth: 02/13/1961 CSN: [id number] Age: 59 Admit Type: Inpatient Procedure:                Colonoscopy Indications:              This is the patient's first colonoscopy, Rectal                            bleeding, anemia, history of Plavix use Providers:                Carlota Raspberry. Havery Moros, MD, Doristine Johns, RN,                            Lesia Sago, Technician Referring MD:              Medicines:                Monitored Anesthesia Care Complications:            No immediate complications. Estimated blood loss:                            Minimal. Estimated Blood Loss:     Estimated blood loss was minimal. Procedure:                Pre-Anesthesia Assessment:                           - Prior to the procedure, a History and Physical                            was performed, and patient medications and                            allergies were reviewed. The patient's tolerance of                            previous anesthesia was also reviewed. The risks                            and benefits of the procedure and the sedation                            options and risks were discussed with the patient.                            All questions were answered, and informed consent                            was obtained. Prior Anticoagulants: The patient has                            taken Plavix (clopidogrel), last dose was 5 days  prior to procedure. ASA Grade Assessment: III - A                            patient with severe systemic disease. After                            reviewing the risks and benefits, the patient was                            deemed in satisfactory condition to undergo the                            procedure.                           After obtaining informed consent, the  colonoscope                            was passed under direct vision. Throughout the                            procedure, the patient's blood pressure, pulse, and                            oxygen saturations were monitored continuously. The                            CF-HQ190L ([id number]) Olympus colonoscope was                            introduced through the anus and advanced to the the                            terminal ileum, with identification of the                            appendiceal orifice and IC valve. The colonoscopy                            was performed without difficulty. The patient                            tolerated the procedure well. The quality of the                            bowel preparation was adequate (following extensive                            lavage). The terminal ileum, ileocecal valve,                            appendiceal orifice, and rectum were photographed. Scope In: 8:14:59 AM Scope Out: 8:50:24 AM Scope Withdrawal Time: 0 hours 31 minutes 51 seconds  Total Procedure Duration: 0 hours 35 minutes  25 seconds  Findings:      The perianal and digital rectal examinations were normal.      The terminal ileum appeared normal.      A 6 mm polyp was found in the transverse colon. The polyp was sessile.       The polyp was removed with a cold snare. Resection and retrieval were       complete.      A 7 to 8 mm inflamed polyp was found in the sigmoid colon. The polyp was       pedunculated. The polyp was removed with a hot snare. Resection and       retrieval were complete. To prevent bleeding after the polypectomy in       light of the patients need for Plavix, one hemostatic clip was       successfully placed.      A 4 mm polyp was found in the rectum. The polyp was sessile. The polyp       was removed with a cold snare. Resection and retrieval were complete.      Internal hemorrhoids were found during retroflexion and appeared        inflamed.      The exam was otherwise without abnormality. Of note, prep fair upon       insertion, several minutes taken to lavage the colon and achieve       adequate views. Impression:               - The examined portion of the ileum was normal.                           - One 6 mm polyp in the transverse colon, removed                            with a cold snare. Resected and retrieved.                           - One 7 to 8 mm polyp in the sigmoid colon, removed                            with a hot snare. Resected and retrieved. Clip was                            placed prophylactically.                           - One 4 mm polyp in the rectum, removed with a cold                            snare. Resected and retrieved.                           - Internal hemorrhoids.                           - The examination was otherwise normal.  Rectal bleeding I suspect most likely is due to                            inflamed internal hemorrhoids. Sigmoid polyp also                            inflamed and could have contributed but think                            hemorrhoids may be the most likely cause. Recommendation:           - Return patient to hospital ward for ongoing care.                           - Advance diet as tolerated.                           - Continue present medications.                           - Resume Plavix tomorrow                           - Await pathology results.                           - If Hgb remains stable without further bleeding,                            can discharge home from GI perspective tomorrow                           - Daily fiber supplement and Anusol as needed for                            hemorrhoids Procedure Code(s):        --- Professional ---                           [phone], Colonoscopy, flexible; with removal of                            tumor(s), polyp(s), or other lesion(s) by snare                             technique Diagnosis Code(s):        --- Professional ---                           P95.0, Other hemorrhoids                           K63.5, Polyp of colon                           K62.1, Rectal polyp  K62.5, Hemorrhage of anus and rectum CPT copyright 2019 American Medical Association. All rights reserved. The codes documented in this report are preliminary and upon coder review may  be revised to meet current compliance requirements. Remo Lipps P. Harrell Niehoff, MD 02/27/2020 9:01:39 AM This report has been signed electronically. Number of Addenda: 0

## 2020-02-27 NOTE — Op Note (Signed)
[facility] Patient Name: Daniel Weiss Procedure Date : 02/27/2020 MRN: [id number] Attending MD: Carlota Raspberry. Havery Moros , MD Date of Birth: 1960/03/13 CSN: [id number] Age: 59 Admit Type: Inpatient Procedure:                Upper GI endoscopy Indications:              Hematemesis, history of Plavix use, anemia -                            symptoms have resolved since admission Providers:                Carlota Raspberry. Havery Moros, MD, Doristine Johns, RN,                            Lesia Sago, Technician Referring MD:              Medicines:                Monitored Anesthesia Care Complications:            No immediate complications. Estimated blood loss:                            Minimal. Estimated Blood Loss:     Estimated blood loss was minimal. Procedure:                Pre-Anesthesia Assessment:                           - Prior to the procedure, a History and Physical                            was performed, and patient medications and                            allergies were reviewed. The patient's tolerance of                            previous anesthesia was also reviewed. The risks                            and benefits of the procedure and the sedation                            options and risks were discussed with the patient.                            All questions were answered, and informed consent                            was obtained. Prior Anticoagulants: The patient has                            taken Plavix (clopidogrel), last dose was 5 days  prior to procedure. ASA Grade Assessment: III - A                            patient with severe systemic disease. After                            reviewing the risks and benefits, the patient was                            deemed in satisfactory condition to undergo the                            procedure.                           After obtaining informed consent, the  endoscope was                            passed under direct vision. Throughout the                            procedure, the patient's blood pressure, pulse, and                            oxygen saturations were monitored continuously. The                            [id number]-[id number] ([id number]) Olympus gastroscope was                            introduced through the mouth, and advanced to the                            second part of duodenum. The upper GI endoscopy was                            accomplished without difficulty. The patient                            tolerated the procedure well. Scope In: Scope Out: Findings:      Esophagogastric landmarks were identified: the Z-line was found at 40       cm, the gastroesophageal junction was found at 40 cm and the upper       extent of the gastric folds was found at 41 cm from the incisors.      A 1 cm hiatal hernia was present.      The exam of the esophagus was otherwise normal.      A two small angiodysplastic lesions with were found in the gastric       fundus (one with adherent heme) and some mild vascular markings at the       pylorus. Fulguration to ablate the lesions to prevent bleeding by argon       plasma was successful.      When water jet was used to lavage the stomach, the distal body had some  friable mucosa with mild oozing with the water jet which stopped with       observation. There was no heme there during initial insertion and benign       changes due to trauma from the jet, do not think causing symptoms. The       exam of the stomach was otherwise normal.      The duodenal bulb and second portion of the duodenum were normal. Impression:               - Esophagogastric landmarks identified.                           - 1 cm hiatal hernia.                           - Normal esophagus otherwise.                           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 Treated  with argon plasma coagulation                            (APC).                           - Some benign contact oozing / friability from                            water jet lavage in distal gastric body.                           - Normal duodenal bulb and second portion of the                            duodenum.                           Small AVMs only pathology noted which could have                            caused bleeding previously, and treated. Recommendation:           - Return patient to hospital ward for ongoing care.                           - Advance diet as tolerated.                           - Continue present medications.                           - Can stop IV protonix, switch to oral protonix                            40mg  once daily                           -  Can resume Plavix tomorrow (due to polypectomy                            from colonoscopy), if remains stable without                            recurrent bleeding I think okay for discharge                            tomorrow                           - GI service will sign off for now, call with                            questions in the interim. We will contact him with                            pathology results from colonoscopy and coordinate                            follow up CBC to ensure stable Hgb Procedure Code(s):        --- Professional ---                           [phone], Esophagogastroduodenoscopy, flexible,                            transoral; with control of bleeding, any method Diagnosis Code(s):        --- Professional ---                           K44.9, Diaphragmatic hernia without obstruction or                            gangrene                           K31.819, Angiodysplasia of stomach and duodenum                            without bleeding                           K92.0, Hematemesis CPT copyright 2019 American Medical Association. All rights reserved. The codes  documented in this report are preliminary and upon coder review may  be revised to meet current compliance requirements. Remo Lipps P. Peaches Vanoverbeke, MD 02/27/2020 9:10:12 AM This report has been signed electronically. Number of Addenda: 0

## 2020-02-27 NOTE — Transfer of Care (Signed)
Immediate Anesthesia Transfer of Care Note  Patient: Daniel Weiss  Procedure(s) Performed: ESOPHAGOGASTRODUODENOSCOPY (EGD) WITH PROPOFOL (N/A ) COLONOSCOPY WITH PROPOFOL (N/A ) HOT HEMOSTASIS (ARGON PLASMA COAGULATION/BICAP) (N/A ) POLYPECTOMY HEMOSTASIS CLIP PLACEMENT  Patient Location: Endoscopy Unit  Anesthesia Type:MAC  Level of Consciousness: awake, alert  and oriented  Airway & Oxygen Therapy: Patient Spontanous Breathing  Post-op Assessment: Report given to RN and Post -op Vital signs reviewed and stable  Post vital signs: Reviewed and stable  Last Vitals:  Vitals Value Taken Time  BP 122/71 02/27/20 0911  Temp    Pulse    Resp 23 02/27/20 0911  SpO2 100     Last Pain:  Vitals:   02/27/20 0657  TempSrc: Tympanic  PainSc: 0-No pain         Complications: No complications documented.

## 2020-02-28 ENCOUNTER — Other Ambulatory Visit (HOSPITAL_COMMUNITY): Payer: Self-pay | Admitting: Internal Medicine

## 2020-02-28 LAB — CBC
HCT: 30.9 % — ABNORMAL LOW (ref 39.0–52.0)
Hemoglobin: 10 g/dL — ABNORMAL LOW (ref 13.0–17.0)
MCH: 26.8 pg (ref 26.0–34.0)
MCHC: 32.4 g/dL (ref 30.0–36.0)
MCV: 82.8 fL (ref 80.0–100.0)
Platelets: 351 10*3/uL (ref 150–400)
RBC: 3.73 MIL/uL — ABNORMAL LOW (ref 4.22–5.81)
RDW: 16.6 % — ABNORMAL HIGH (ref 11.5–15.5)
WBC: 9.6 10*3/uL (ref 4.0–10.5)
nRBC: 0 % (ref 0.0–0.2)

## 2020-02-28 LAB — BASIC METABOLIC PANEL
Anion gap: 12 (ref 5–15)
BUN: 7 mg/dL (ref 6–20)
CO2: 27 mmol/L (ref 22–32)
Calcium: 9.8 mg/dL (ref 8.9–10.3)
Chloride: 99 mmol/L (ref 98–111)
Creatinine, Ser: 0.77 mg/dL (ref 0.61–1.24)
GFR, Estimated: >60 mL/min (ref >60)
Glucose, Bld: 103 mg/dL — ABNORMAL HIGH (ref 70–99)
Potassium: 3.4 mmol/L — ABNORMAL LOW (ref 3.5–5.1)
Sodium: 138 mmol/L (ref 135–145)

## 2020-02-28 LAB — SURGICAL PATHOLOGY

## 2020-02-28 MED ORDER — POTASSIUM CHLORIDE CRYS ER 20 MEQ PO TBCR
40.0000 meq | EXTENDED_RELEASE_TABLET | Freq: Once | ORAL | Status: AC
  Administered 2020-02-28: 09:00:00 40 meq via ORAL
  Filled 2020-02-28: qty 2

## 2020-02-28 MED ORDER — SPIRONOLACTONE 25 MG PO TABS: 12.5000 mg | ORAL_TABLET | Freq: Every day | ORAL | 0 refills | Status: DC

## 2020-02-28 MED ORDER — CLOPIDOGREL BISULFATE 75 MG PO TABS: 75.0000 mg | ORAL_TABLET | Freq: Every day | ORAL | Status: DC

## 2020-02-28 MED FILL — SPIRONOLACTONE 25 MG TABLET: 25 | 60 days supply | Qty: 30 | Fill #0

## 2020-02-28 NOTE — Progress Notes (Signed)
D/C instructions given and reviewed, Questions asked and answered. Tele and IV removed, tolerated well.

## 2020-02-28 NOTE — Progress Notes (Signed)
Heart Failure Stewardship Pharmacist Progress Note   PCP: Azzie Glatter, FNP PCP-Cardiologist: Glenetta Hew, MD    HPI:  59 yo M with PMH of CHF, CAD s/p PCI to LAD in 2019, CVA, HTN, HLD, and metastatic prostate cancer. He presented to the ED on 02/22/20 for rectal bleeding, chest pain, and lightheadedness. An ECHO was done on 02/23/20 and LVEF was reduced to 30-35% (was 35-40% in 10/2018).   Current HF Medications: Furosemide 20 mg daily Metoprolol XL 12.5 mg daily Entresto 24/26 mg BID Spironolactone 12.5 mg daily  Prior to admission HF Medications: Furosemide 20 mg daily PRN Metoprolol XL 12.5 mg daily Entresto 24/26 mg (one-half tablet) BID  Pertinent Lab Values: . Serum creatinine 0.77, BUN 7, Potassium 3.4, Sodium 138, Magnesium 1.9   Vital Signs: . Weight: 143 lbs (admission weight: 144 lbs) . Blood pressure: 115/71 . Heart rate: 80-90s   Medication Assistance / Insurance Benefits Check: Does the patient have prescription insurance?  Yes Type of insurance plan: [place] Medicaid  Does the patient qualify for medication assistance through manufacturers or grants?   No  Outpatient Pharmacy:  Prior to admission outpatient pharmacy: [facility] Is the patient willing to use [facility] pharmacy at discharge? Yes  Assessment: 1. Acute on chronic systolic CHF (EF 89-21%), due to ICM. NYHA class II symptoms. - Continue furosemide 20 mg daily - Continue metoprolol XL 12.5 mg daily - Continue Entresto 24/26 mg BID - Continue spironolactone 12.5 mg daily - Consider starting Farxiga 10 mg daily at follow up   Plan: 1) Medication changes recommended at this time: - Continue current regimen - discharge today  2)  Education  - Patient has been educated on current HF medications and potential additions to HF medication regimen (Farxiga) - Patient verbalizes understanding that over the next few months, these medication doses may change and more medications may be added to  optimize HF regimen - Patient has been educated on basic disease state pathophysiology and goals of therapy  Kerby Nora, PharmD, BCPS Heart Failure Stewardship Pharmacist Phone [phone]

## 2020-02-28 NOTE — Anesthesia Postprocedure Evaluation (Signed)
Anesthesia Post Note  Patient: Daniel Weiss  Procedure(s) Performed: ESOPHAGOGASTRODUODENOSCOPY (EGD) WITH PROPOFOL (N/A ) COLONOSCOPY WITH PROPOFOL (N/A ) HOT HEMOSTASIS (ARGON PLASMA COAGULATION/BICAP) (N/A ) POLYPECTOMY HEMOSTASIS CLIP PLACEMENT     Patient location during evaluation: PACU Anesthesia Type: MAC Level of consciousness: awake and alert and oriented Pain management: pain level controlled Vital Signs Assessment: post-procedure vital signs reviewed and stable Respiratory status: spontaneous breathing, nonlabored ventilation and respiratory function stable Cardiovascular status: blood pressure returned to baseline Postop Assessment: no apparent nausea or vomiting Anesthetic complications: no   No complications documented.        Brennan Bailey

## 2020-02-28 NOTE — Progress Notes (Signed)
Occupational Therapy Treatment Patient Details Name: Daniel Weiss MRN: [id number] DOB: January 27, 1961 Today's Date: 02/28/2020    History of present illness Pt is a 59 y/o male admitted secondary to syncopal episodes with acute blood loss anemia and chest pain. S/p EDG on 12/20. Workup pending. PMH includes prostate CA, CAD,and CHF.   OT comments  Pt progressing towards established OT goals. Pt agreeable to therapy and performing grooming at sink, dressing, and functional mobility in room at Mod I level with increased time. Pt demonstrating increased balance and activity tolerance. Update dc to home once medically stable per physician and continue to recommend shower seat for EC with bathing. Answered questions in preparation for ADLs at home.    Follow Up Recommendations  Supervision - Intermittent    Equipment Recommendations  Tub/shower seat    Recommendations for Other Services      Precautions / Restrictions Precautions Precautions: Fall Precaution Comments: 2 falls within the past week, 4 within the past month       Mobility Bed Mobility Overal bed mobility: Modified Independent                Transfers Overall transfer level: Modified independent                    Balance Overall balance assessment: Needs assistance Sitting-balance support: No upper extremity supported Sitting balance-Leahy Scale: Good     Standing balance support: No upper extremity supported;During functional activity Standing balance-Leahy Scale: Good                             ADL either performed or assessed with clinical judgement   ADL Overall ADL's : Modified independent                                       General ADL Comments: Pt performing grooming at sink and dressing activity. Mod I level with increased time as needed.     Vision       Perception     Praxis      Cognition Arousal/Alertness: Awake/alert Behavior During  Therapy: WFL for tasks assessed/performed Overall Cognitive Status: Within Functional Limits for tasks assessed                                 General Comments: Very eager to dc to home        Exercises     Shoulder Instructions       General Comments HR 106. Denies any dizziness or SOB    Pertinent Vitals/ Pain       Pain Assessment: No/denies pain  Home Living                                          Prior Functioning/Environment              Frequency  Min 2X/week        Progress Toward Goals  OT Goals(current goals can now be found in the care plan section)  Progress towards OT goals: Progressing toward goals  Acute Rehab OT Goals Patient Stated Goal: "to get back to doing what I used to do" OT Goal Formulation: With  patient Time For Goal Achievement: 03/08/20 Potential to Achieve Goals: Good ADL Goals Pt Will Perform Lower Body Bathing: with modified independence;sit to/from stand Pt Will Perform Lower Body Dressing: with modified independence;sit to/from stand Pt Will Transfer to Toilet: with modified independence;ambulating Pt Will Perform Toileting - Clothing Manipulation and hygiene: with modified independence;sit to/from stand Additional ADL Goal #1: Pt will independently verbalize 3 strategies to reduce irsk of falls Additional ADL Goal #2: Pt will independently verbalize 3 energy conservation strategies  Plan Discharge plan needs to be updated    Co-evaluation                 AM-PAC OT "6 Clicks" Daily Activity     Outcome Measure   Help from another person eating meals?: None Help from another person taking care of personal grooming?: None Help from another person toileting, which includes using toliet, bedpan, or urinal?: None Help from another person bathing (including washing, rinsing, drying)?: None Help from another person to put on and taking off regular upper body clothing?: None Help from  another person to put on and taking off regular lower body clothing?: None 6 Click Score: 24    End of Session    OT Visit Diagnosis: Unsteadiness on feet (R26.81);Other abnormalities of gait and mobility (R26.89);Repeated falls (R29.6);Muscle weakness (generalized) (M62.81);Dizziness and giddiness (R42)   Activity Tolerance Patient tolerated treatment well   Patient Left in chair;with call bell/phone within reach   Nurse Communication Mobility status        Time: 8032-1224 OT Time Calculation (min): 13 min  Charges: OT General Charges $OT Visit: 1 Visit OT Treatments $Self Care/Home Management : 8-22 mins  [place], OTR/L Acute Rehab Pager: [phone] Office: [place] 02/28/2020, 9:21 AM

## 2020-02-28 NOTE — Plan of Care (Signed)
  Problem: Education: Goal: Knowledge of General Education information will improve Description: Including pain rating scale, medication(s)/side effects and non-pharmacologic comfort measures Outcome: Adequate for Discharge   Problem: Health Behavior/Discharge Planning: Goal: Ability to manage health-related needs will improve Outcome: Adequate for Discharge   Problem: Clinical Measurements: Goal: Ability to maintain clinical measurements within normal limits will improve Outcome: Adequate for Discharge Goal: Will remain free from infection Outcome: Adequate for Discharge Goal: Diagnostic test results will improve Outcome: Adequate for Discharge Goal: Respiratory complications will improve Outcome: Adequate for Discharge Goal: Cardiovascular complication will be avoided Outcome: Adequate for Discharge   Problem: Nutrition: Goal: Adequate nutrition will be maintained Outcome: Adequate for Discharge   Problem: Elimination: Goal: Will not experience complications related to bowel motility Outcome: Adequate for Discharge

## 2020-03-06 ENCOUNTER — Telehealth: Payer: Self-pay | Admitting: *Deleted

## 2020-03-06 NOTE — Telephone Encounter (Signed)
Thank you :)

## 2020-03-06 NOTE — Telephone Encounter (Signed)
Dr Lamar Benes called regarding the second opinion for this patient and states that he agrees with Dr Bethanne Ginger plan of treatment. Reports that patient did not come for his appointment there today and to call back if they can be of help

## 2020-03-06 NOTE — Telephone Encounter (Signed)
Please arrange him to do lab cbc cmp PSA 1-2 days prior to MD. Follow up after PET.

## 2020-03-06 NOTE — Telephone Encounter (Signed)
Dr. Cathie Hoops, patient is not scheduled for f/u with you.  Would you like him to be scheduled?

## 2020-03-07 NOTE — Discharge Summary (Signed)
Physician Discharge Summary  Daniel Weiss [id number] DOB: [birth year] DOA: 02/22/2020  PCP: Azzie Glatter, FNP  Admit date: 02/22/2020 Discharge date: 02/28/2020  Time spent: 45 minutes  Recommendations for Outpatient Follow-up:  1. PCP Kathe Becton 1/11, please check CBC at follow-up 2. Cardiology Dr. Ellyn Hack in 1 month   Discharge Diagnoses:  Active Problems: Atypical chest pain CAD Ischemic cardiomyopathy, EF of 35% Hematochezia   Rectal bleeding   Absolute anemia   Acute blood loss anemia   GI bleed   Hematemesis without nausea   Benign neoplasm of colon   Discharge Condition: Stable Diet recommendation: Low-sodium, heart healthy  Filed Weights   02/27/20 0411 02/27/20 0657 02/28/20 0311  Weight: 65.1 kg 65.1 kg 64.9 kg    History of present illness:  59 year old gentleman prior history of chronic systolic and diastolic heart failure with left ventricular ejection fraction of 35%, coronary artery disease s/p PCI to LAD/2019, ischemic cardiomyopathy, CVA, hypertension, hyperlipidemia, metastatic prostate cancer presents to ED for chest pain andlightheadedness. He was found to have rectal bleeding and guaiac positive stools  Hospital Course:   Atypical chest pain - Mildly elevated troponins, flat trend - EKG shows left bundle branch block and is unchanged -Evaluated by cardiology, echocardiogram left ventricle ejection fraction of 35% unchanged from recent past, recent stress test in November with prior LAD infarct, no evidence of ischemia,  -Plavix was held this admission in the setting of GI bleed, now resumed at discharge  CAD Ischemic cardiomyopathy EF of 35% -Clinically compensated and euvolemic, history of PCI to LAD in 2019 -Continue Entresto, Lasix, metoprolol -Plavix restarted upon discharge  Hematochezia Occasional hematemesis -Patient reported history of intermittent hematochezia and an episode of hematemesis prior to  admission -Gastroenterology consulted,  underwent EGD and colonoscopy yesterday, colonoscopy noted multiple polyps which were removed, EGD noted few nonbleeding AVMs in the stomach which were treated with APC, recommended to resume Plavix after 1 day  -No further bleeding noted this hospitalization, advised rechecking CBC in 1 week  History of CVA Continue with Crestor and Plavix  Essential hypertension Blood pressure parameters are optimal at this time   Consultants -Gastroenterology Cardiology  Procedures  EGD 12/20 Impression:               - Esophagogastric landmarks identified.                           - 1 cm hiatal hernia.                           - Normal esophagus otherwise.                           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 Treated with argon plasma coagulation                            (APC).                           - Some benign contact oozing / friability from  water jet lavage in distal gastric body.                           - Normal duodenal bulb and second portion of the                            duodenum.                           Small AVMs only pathology noted which could have                            caused bleeding previously, and treated   Colonoscopy 12/20 Impression:               - The examined portion of the ileum was normal.                           - One 6 mm polyp in the transverse colon, removed                            with a cold snare. Resected and retrieved.                           - One 7 to 8 mm polyp in the sigmoid colon, removed                            with a hot snare. Resected and retrieved. Clip was                            placed prophylactically.                           - One 4 mm polyp in the rectum, removed with a cold                            snare. Resected and retrieved.                           - Internal  hemorrhoids.                           - The examination was otherwise normal.                           Rectal bleeding I suspect most likely is due to                            inflamed internal hemorrhoids. Sigmoid polyp also                            inflamed and could have contributed but think                            hemorrhoids may be  the most likely cause.  Discharge Exam: Vitals:   02/27/20 2046 02/28/20 0311  BP:  115/71  Pulse:  99  Resp: 16 18  Temp:  98.7 F (37.1 C)  SpO2:  97%    General: AAOx3, no distress Cardiovascular: S1-S2, regular rate rhythm Respiratory: Clear  Discharge Instructions   Discharge Instructions    Diet - low sodium heart healthy   Complete by: As directed    Increase activity slowly   Complete by: As directed      Allergies as of 02/28/2020   No Known Allergies     Medication List    STOP taking these medications   oxyCODONE-acetaminophen 5-325 MG tablet Commonly known as: PERCOCET/ROXICET     TAKE these medications   abiraterone acetate 250 MG tablet Commonly known as: ZYTIGA Take 4 tablets (1,000 mg total) by mouth daily. Take on an empty stomach 1 hour before or 2 hours after a meal   albuterol 108 (90 Base) MCG/ACT inhaler Commonly known as: VENTOLIN HFA Inhale 2 puffs into the lungs every 6 (six) hours as needed for wheezing or shortness of breath.   clopidogrel 75 MG tablet Commonly known as: PLAVIX Take 1 tablet (75 mg total) by mouth daily.   Entresto 24-26 MG Generic drug: sacubitril-valsartan Take 1/2 tablet in the morning and 1 tablet in the evening What changed:   how much to take  how to take this  when to take this  additional instructions   furosemide 20 MG tablet Commonly known as: Lasix May take 20 mg tablet as needed for increase shortness of breath or swelling What changed:   how much to take  how to take this  when to take this  reasons to take this  additional  instructions   metoprolol succinate 25 MG 24 hr tablet Commonly known as: TOPROL-XL Take 1 tablet (25 mg total) by mouth daily with supper. What changed: how much to take   nitroGLYCERIN 0.4 MG SL tablet Commonly known as: Nitrostat Place 1 tablet (0.4 mg total) under the tongue every 5 (five) minutes as needed for chest pain.   ondansetron 4 MG tablet Commonly known as: ZOFRAN Take 4 mg by mouth every 4 (four) hours as needed for nausea or vomiting.   predniSONE 5 MG tablet Commonly known as: DELTASONE Take 1 tablet (5 mg total) by mouth daily with breakfast.   rosuvastatin 40 MG tablet Commonly known as: CRESTOR Take 1 tablet by mouth once daily   spironolactone 25 MG tablet Commonly known as: ALDACTONE Take 0.5 tablets (12.5 mg total) by mouth daily.   tamsulosin 0.4 MG Caps capsule Commonly known as: FLOMAX Take 1 capsule (0.4 mg total) by mouth daily after supper.   venlafaxine XR 37.5 MG 24 hr capsule Commonly known as: Effexor XR Take 1 capsule (37.5 mg total) by mouth daily with breakfast.      No Known Allergies  Follow-up Information    [name], MD. Schedule an appointment as soon as possible for a visit in 2 week(s).   Specialty: Cardiology Contact information: [address] [place] [address] [phone]        Azzie Glatter, [place]. Go on 03/20/2020.   Specialty: Family Medicine Why: @1 :30pm Contact information: [place]  [id number] [phone]                The results of significant diagnostics from this hospitalization (including imaging, microbiology, ancillary and laboratory) are  listed below for reference.    Significant Diagnostic Studies: DG Chest 2 View  Result Date: 02/22/2020 CLINICAL DATA:  Chest pain EXAM: CHEST - 2 VIEW COMPARISON:  11/03/2018 FINDINGS: Heart and mediastinal contours are within normal limits. No focal opacities or effusions. No acute bony abnormality.  IMPRESSION: No active cardiopulmonary disease. Electronically Signed   By: Rolm Baptise M.D.   On: 02/22/2020 21:50   CT HEAD WO CONTRAST  Result Date: 02/23/2020 CLINICAL DATA:  Right arm numbness. EXAM: CT HEAD WITHOUT CONTRAST TECHNIQUE: Contiguous axial images were obtained from the base of the skull through the vertex without intravenous contrast. COMPARISON:  11/03/2018 FINDINGS: Brain: No acute intracranial abnormality. Specifically, no hemorrhage, hydrocephalus, mass lesion, acute infarction, or significant intracranial injury. Vascular: No hyperdense vessel or unexpected calcification. Skull: No acute calvarial abnormality. Sinuses/Orbits: Visualized paranasal sinuses and mastoids clear. Orbital soft tissues unremarkable. Other: None IMPRESSION: Normal study. Electronically Signed   By: Rolm Baptise M.D.   On: 02/23/2020 00:44   ECHOCARDIOGRAM COMPLETE  Result Date: 02/23/2020    ECHOCARDIOGRAM REPORT   Patient Name:   Daniel Weiss Date of Exam: 02/23/2020 Medical Rec #:  [id number]    Height:       72.0 in Accession #:    [id number]   Weight:       152.0 lb Date of Birth:  12/02/1960   BSA:          1.896 m Patient Age:    59 years     BP:           105/81 mmHg Patient Gender: M            HR:           96 bpm. Exam Location:  Inpatient Procedure: 2D Echo, Cardiac Doppler and Color Doppler Indications:    R07.9* Chest pain, unspecified  History:        Patient has prior history of Echocardiogram examinations, most                 recent 11/04/2018. Cardiomyopathy, Previous Myocardial Infarction                 and CAD, TIA and Stroke, Arrythmias:LBBB,                 Signs/Symptoms:Dyspnea; Risk Factors:Hypertension.  Sonographer:    Bernadene Person RDCS Referring Phys: [id number] [place]  1. Akinesis of the distal anteroseptal and apical walls with overall moderate to severe LV dysfunction.  2. Left ventricular ejection fraction, by estimation, is 30 to 35%. The left ventricle has  moderate to severely decreased function. The left ventricle demonstrates regional wall motion abnormalities (see scoring diagram/findings for description). Left ventricular diastolic parameters are consistent with Grade II diastolic dysfunction (pseudonormalization).  3. Right ventricular systolic function is normal. The right ventricular size is normal.  4. The mitral valve is normal in structure. No evidence of mitral valve regurgitation. No evidence of mitral stenosis.  5. The aortic valve has an indeterminant number of cusps. Aortic valve regurgitation is trivial. No aortic stenosis is present.  6. The inferior vena cava is normal in size with greater than 50% respiratory variability, suggesting right atrial pressure of 3 mmHg. FINDINGS  Left Ventricle: Left ventricular ejection fraction, by estimation, is 30 to 35%. The left ventricle has moderate to severely decreased function. The left ventricle demonstrates regional wall motion abnormalities. The left ventricular internal cavity size was normal in size. There is  no left ventricular hypertrophy. Abnormal (paradoxical) septal motion, consistent with left bundle branch block. Left ventricular diastolic parameters are consistent with Grade II diastolic dysfunction (pseudonormalization). Right Ventricle: The right ventricular size is normal. Right ventricular systolic function is normal. Left Atrium: Left atrial size was normal in size. Right Atrium: Right atrial size was normal in size. Pericardium: There is no evidence of pericardial effusion. Mitral Valve: The mitral valve is normal in structure. No evidence of mitral valve regurgitation. No evidence of mitral valve stenosis. Tricuspid Valve: The tricuspid valve is normal in structure. Tricuspid valve regurgitation is trivial. No evidence of tricuspid stenosis. Aortic Valve: The aortic valve has an indeterminant number of cusps. Aortic valve regurgitation is trivial. No aortic stenosis is present. Pulmonic  Valve: The pulmonic valve was not well visualized. Pulmonic valve regurgitation is not visualized. No evidence of pulmonic stenosis. Aorta: The aortic root is normal in size and structure. Venous: The inferior vena cava is normal in size with greater than 50% respiratory variability, suggesting right atrial pressure of 3 mmHg. IAS/Shunts: No atrial level shunt detected by color flow Doppler. Additional Comments: Akinesis of the distal anteroseptal and apical walls with overall moderate to severe LV dysfunction.  LEFT VENTRICLE PLAX 2D LVIDd:         4.30 cm  Diastology LVIDs:         3.10 cm  LV e' medial:    6.85 cm/s LV PW:         0.90 cm  LV E/e' medial:  14.6 LV IVS:        1.00 cm  LV e' lateral:   9.32 cm/s LVOT diam:     2.10 cm  LV E/e' lateral: 10.7 LV SV:         94 LV SV Index:   49 LVOT Area:     3.46 cm  RIGHT VENTRICLE RV S prime:     10.10 cm/s TAPSE (M-mode): 2.2 cm LEFT ATRIUM             Index       RIGHT ATRIUM           Index LA diam:        3.00 cm 1.58 cm/m  RA Area:     11.90 cm LA Vol (A2C):   24.6 ml 12.97 ml/m RA Volume:   24.40 ml  12.87 ml/m LA Vol (A4C):   19.2 ml 10.13 ml/m LA Biplane Vol: 22.6 ml 11.92 ml/m  AORTIC VALVE LVOT Vmax:   169.00 cm/s LVOT Vmean:  116.000 cm/s LVOT VTI:    0.270 m  AORTA Ao Root diam: 3.00 cm Ao Asc diam:  2.90 cm MITRAL VALVE MV Area (PHT): 6.83 cm     SHUNTS MV Decel Time: 111 msec     Systemic VTI:  0.27 m MV E velocity: 100.00 cm/s  Systemic Diam: 2.10 cm MV A velocity: 63.40 cm/s MV E/A ratio:  1.58 Kirk Ruths MD Electronically signed by Kirk Ruths MD Signature Date/Time: 02/23/2020/11:30:22 AM    Final    VAS US CAROTID  Result Date: 02/24/2020 Carotid Arterial Duplex Study Indications:       Syncope. Risk Factors:      Hypertension, hyperlipidemia, current smoker, coronary artery                    disease. Other Factors:     Prostate cancer, ischemic cardiomyopathy, Warthin's tumor,  right neck. Comparison  Study:  Prior study indicating 1-39% ICA stenosis, from 07/07/14, is                    available for comparison Performing Technologist: Sharion Dove RVS  Examination Guidelines: A complete evaluation includes B-mode imaging, spectral Doppler, color Doppler, and power Doppler as needed of all accessible portions of each vessel. Bilateral testing is considered an integral part of a complete examination. Limited examinations for reoccurring indications may be performed as noted.  Right Carotid Findings: +----------+--------+--------+--------+------------------+--------+           PSV cm/sEDV cm/sStenosisPlaque DescriptionComments +----------+--------+--------+--------+------------------+--------+ CCA Prox  122     29                                         +----------+--------+--------+--------+------------------+--------+ CCA Distal110     29                                         +----------+--------+--------+--------+------------------+--------+ ICA Prox  45      16                                         +----------+--------+--------+--------+------------------+--------+ ICA Distal65      29                                         +----------+--------+--------+--------+------------------+--------+ ECA       50      10                                         +----------+--------+--------+--------+------------------+--------+ +----------+--------+-------+--------+-------------------+           PSV cm/sEDV cmsDescribeArm Pressure (mmHG) +----------+--------+-------+--------+-------------------+ [id number]                                         +----------+--------+-------+--------+-------------------+ +---------+--------+--+--------+--+ VertebralPSV cm/s82EDV cm/s29 +---------+--------+--+--------+--+  Left Carotid Findings: +----------+--------+--------+--------+------------------+--------+           PSV cm/sEDV cm/sStenosisPlaque  DescriptionComments +----------+--------+--------+--------+------------------+--------+ CCA Prox  133     36                                         +----------+--------+--------+--------+------------------+--------+ CCA Distal72      23                                         +----------+--------+--------+--------+------------------+--------+ ICA Prox  59      28                                         +----------+--------+--------+--------+------------------+--------+ ICA Distal95  38                                         +----------+--------+--------+--------+------------------+--------+ ECA       62      17                                         +----------+--------+--------+--------+------------------+--------+ +----------+--------+--------+--------+-------------------+           PSV cm/sEDV cm/sDescribeArm Pressure (mmHG) +----------+--------+--------+--------+-------------------+ [id number]                                          +----------+--------+--------+--------+-------------------+ +---------+--------+--+--------+-+ VertebralPSV cm/s13EDV cm/s4 +---------+--------+--+--------+-+   Summary:   *See table(s) above for measurements and observations.  Electronically signed by Monica Martinez MD on 02/24/2020 at 4:47:27 PM.    Final     Microbiology: No results found for this or any previous visit (from the past 240 hour(s)).   Labs: Basic Metabolic Panel: No results for input(s): NA, K, CL, CO2, GLUCOSE, BUN, CREATININE, CALCIUM, MG, PHOS in the last 168 hours. Liver Function Tests: No results for input(s): AST, ALT, ALKPHOS, BILITOT, PROT, ALBUMIN in the last 168 hours. No results for input(s): LIPASE, AMYLASE in the last 168 hours. No results for input(s): AMMONIA in the last 168 hours. CBC: No results for input(s): WBC, NEUTROABS, HGB, HCT, MCV, PLT in the last 168 hours. Cardiac Enzymes: No results for input(s): CKTOTAL,  CKMB, CKMBINDEX, TROPONINI in the last 168 hours. BNP: BNP (last 3 results) No results for input(s): BNP in the last 8760 hours.  ProBNP (last 3 results) No results for input(s): PROBNP in the last 8760 hours.  CBG: No results for input(s): GLUCAP in the last 168 hours.     Signed:  Domenic Polite MD.  [facility] 03/07/2020, 4:18 PM

## 2020-03-07 NOTE — Telephone Encounter (Signed)
Please schedule and call patient with appt details. 

## 2020-03-07 NOTE — Telephone Encounter (Signed)
Done... Pt has been sched for lab 1-2 days prior to MD visit after his PET. I was unable to reach him by phone and his phone wasn't set up for any messages to be left. A NEW appt reminder letter will be mailed out making him aware of his upcoming appts.

## 2020-03-14 ENCOUNTER — Other Ambulatory Visit: Payer: Self-pay

## 2020-03-14 ENCOUNTER — Inpatient Hospital Stay: Payer: Medicaid Other | Attending: Oncology

## 2020-03-14 ENCOUNTER — Ambulatory Visit
Admission: RE | Admit: 2020-03-14 | Discharge: 2020-03-14 | Disposition: A | Discharge status: Home / Self Care | Payer: Medicaid Other | Source: Ambulatory Visit | Attending: Radiation Oncology | Admitting: Radiation Oncology

## 2020-03-14 DIAGNOSIS — D110 Benign neoplasm of parotid gland: Secondary | ICD-10-CM | POA: Insufficient documentation

## 2020-03-14 DIAGNOSIS — Z9079 Acquired absence of other genital organ(s): Secondary | ICD-10-CM | POA: Insufficient documentation

## 2020-03-14 DIAGNOSIS — C801 Malignant (primary) neoplasm, unspecified: Secondary | ICD-10-CM | POA: Diagnosis present

## 2020-03-14 DIAGNOSIS — Z79899 Other long term (current) drug therapy: Secondary | ICD-10-CM | POA: Insufficient documentation

## 2020-03-14 DIAGNOSIS — R599 Enlarged lymph nodes, unspecified: Secondary | ICD-10-CM | POA: Insufficient documentation

## 2020-03-14 DIAGNOSIS — M899 Disorder of bone, unspecified: Secondary | ICD-10-CM | POA: Insufficient documentation

## 2020-03-14 DIAGNOSIS — C61 Malignant neoplasm of prostate: Secondary | ICD-10-CM | POA: Insufficient documentation

## 2020-03-14 DIAGNOSIS — C7951 Secondary malignant neoplasm of bone: Secondary | ICD-10-CM | POA: Insufficient documentation

## 2020-03-14 LAB — GLUCOSE, CAPILLARY: Glucose-Capillary: 76 mg/dL (ref 70–99)

## 2020-03-14 MED ORDER — FLUDEOXYGLUCOSE F - 18 (FDG) INJECTION
7.3500 | Freq: Once | INTRAVENOUS | Status: AC | PRN
  Administered 2020-03-14: 7.35 via INTRAVENOUS

## 2020-03-15 ENCOUNTER — Telehealth: Payer: Self-pay | Admitting: *Deleted

## 2020-03-15 NOTE — Telephone Encounter (Signed)
Thanks for the update

## 2020-03-15 NOTE — Telephone Encounter (Signed)
Dr Lamar Benes called reporting that patient came for appointment today from his no Show a week ago, but he left without being seen. 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

## 2020-03-16 ENCOUNTER — Inpatient Hospital Stay (HOSPITAL_BASED_OUTPATIENT_CLINIC_OR_DEPARTMENT_OTHER): Payer: Medicaid Other | Admitting: Oncology

## 2020-03-16 ENCOUNTER — Encounter: Payer: Self-pay | Admitting: Oncology

## 2020-03-16 ENCOUNTER — Other Ambulatory Visit: Payer: Self-pay

## 2020-03-16 ENCOUNTER — Telehealth: Payer: Self-pay

## 2020-03-16 VITALS — BP 158/84 | HR 74 | Temp 96.4°F | Resp 16 | Wt 160.2 lb

## 2020-03-16 DIAGNOSIS — C801 Malignant (primary) neoplasm, unspecified: Secondary | ICD-10-CM | POA: Diagnosis not present

## 2020-03-16 DIAGNOSIS — K118 Other diseases of salivary glands: Secondary | ICD-10-CM | POA: Diagnosis not present

## 2020-03-16 DIAGNOSIS — Z79899 Other long term (current) drug therapy: Secondary | ICD-10-CM | POA: Diagnosis not present

## 2020-03-16 DIAGNOSIS — C61 Malignant neoplasm of prostate: Secondary | ICD-10-CM

## 2020-03-16 DIAGNOSIS — C7951 Secondary malignant neoplasm of bone: Secondary | ICD-10-CM | POA: Diagnosis not present

## 2020-03-16 DIAGNOSIS — Z9079 Acquired absence of other genital organ(s): Secondary | ICD-10-CM | POA: Diagnosis not present

## 2020-03-16 NOTE — Telephone Encounter (Signed)
Patient has not seen ENT yet. Referral was sent in December and per phone note on 12/13, ENT was unable to reach him but left VM for pt to call back. Pt did not call back to set up appt.   Called ENT and got pt scheduled for Monday 1/17 @ 8:30. patient notified of appts and wrote information down.

## 2020-03-16 NOTE — Progress Notes (Signed)
Hematology/Oncology follow up note [facility] Telephone:([phone] Fax:(336) [phone]   Patient Care Team: Azzie Glatter, FNP as PCP - General (Family Medicine) [name], MD as PCP - Cardiology (Cardiology) McKenzie, Candee Furbish, MD as Consulting Physician (Urology)  REFERRING PROVIDER: Azzie Glatter, FNP  CHIEF COMPLAINTS/REASON FOR VISIT:  Follow up for prostate cancer  HISTORY OF PRESENTING ILLNESS:   Daniel Weiss is a  60 y.o.  male with PMH listed below was seen in consultation at the request of  Azzie Glatter, FNP  for evaluation of presumed prostate cancer Patient recently switched his care to [facility] and was seen by Dr. [name]. He was previously followed up at [facility] urology I reviewed Dr. Dene Gentry note. Patient was diagnosed with presumed prostate cancer on 10/2018 with a PSA of 509, incidental findings of multiple sclerotic lesions on CT-chest abdomen pelvis during work-up for a CVA.  His previous urology care was with Dr. Alyson Ingles. 11/05/2018 bone scan showed solitary punctate focus of activity in the left first sacral segment corresponding to a 9 mm mixed lytic and sclerotic metastatic's on recent CT.  The remaining numerous sclerotic osseous metastasis identified on CT do not demonstrate abnormal activity and are therefore healed.  At that time No tissue diagnosis/biopsy was obtained. Patient was started with Lupron and Casodex Lupron was eventually to Eligard Patient [facility], PSA was 13.13, testosterone level less than 10. Casodex was discontinued in June 2021.   10/26/2019 patient switched to [facility] urology and was seen by Dr. [name] Per urology note, PSA trend: 11/03/2018: 502 11/22/2018: 377 02/21/2019: 387 04/25/2019: 123 09/05/2019: 13.3  Patient gets androgen deprivation therapy through Dr. Dene Gentry office.  Eligard 45mg   on 11/11/2019 -Patient was  referred to heme-onc on 11/01/2019.  10/06/2019 bone scan showed resolution of punctated activity over the left sacrum.  The punctate area of increased activity over the right lower lumbar spine again noted. 10/31/2019, CT angio chest aorta showed no evidence of aortic aneurysm or dissection.  Atherosclerotic calcification spleen seen in the upper abdominal aorta.  Chronic artery disease.  No acute cardiopulmonary disease. Patient has a chronic right neck mass.  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   11/18/2019 CT abdomen pelvis with contrast showed retrocaval lymphadenopathy in the right common and external iliac chains.  Stable to minimally progressed in the interval since last CT scan 11/03/2018 Numerous sclerotic bone lesions.  Stable 3 mm subpleural right middle lobe lung nodule stable.  Aortic atherosclerosis   Patient has extensive cardiology issues.  He follows up with Dr. Ellyn Hack. Patient has history of STEMI-PCI to LAD with resultant ischemia cardiomyopathy, EF 35 to 40%, left bundle branch block with acute CVA-10/2018.  CT coronary showed nonobstructing plaquing of coronary distributions, intimal irregularity with filling defect in the ascending aorta but no LV thrombus.  And the patient is on warfarin and Plavix.  Patient was seen by thoracic surgeon Dr. Roxy Manns will recommend patient to be on Coumadin for minimum of 3 months.  With presentation with stroke, Dr. Ellyn Hack recommend at least 6 to 12 months of Coumadin. -Coumadin was discontinued by Dr. Ellyn Hack in September 2021 and switched to.  Plavix Patient lives with his sister.  He has 3 adult children.  His activity is quite limited due to shortness of breath with exertion due  to cardiology problems.  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 Tumor markers Showed normal  LDH, beta hCG, AFP # 01/17/2020 scrotum ultrasound showed a small right testicular mass measuring 1.6 cm, contained an internal calcification.  Appearance was consistent with primary testicular neoplasm.  Normal size and appearance of the left testicle.  #Prostate cancer, metastatic M1 A.  On androgen deprivation therapy.  Eligard through Dr. Dene Gentry office. 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  #01/17/2020 right orchiectomy residual germ cell tumor and germ cell neoplasia in situ are not identified.  Case was discussed on tumor board.  Consensus reached a point although residual germ cell tumor/infection was not identified, the presence of scar with extensive tubular atrophy is compatible with a completely regressed germ cell tumor.  INTERVAL HISTORY Daniel Weiss is a 60 y.o. male who has above history reviewed by me today presents for follow up visit for management of metastatic prostate cancer and germ cell tumor.  Problems and complaints are listed below: Eligard 45mg   on 11/11/2019 Dr. Dene Gentry office.  PET was done and patient presents to discuss results.  He went to [facility] for second opinion as I recommended. Due to long waiting time, he left without being seen. Dr.Rush from [facility] called and agrees with patient current treatment.  He is seeing [facility] next week.  No new complaints, he has gain weight. Chronic SOB, unchanged.   Review of Systems  Constitutional: Negative for appetite change, chills, fatigue, fever and unexpected weight change.  HENT:   Negative for hearing loss and voice change.   Eyes: Negative for eye problems and icterus.  Respiratory: Positive for shortness of breath. Negative for chest tightness and cough.   Cardiovascular: Negative for chest pain and leg  swelling.  Gastrointestinal: Negative for abdominal distention and abdominal pain.  Endocrine: Negative for hot flashes.  Genitourinary: Negative for difficulty urinating, dysuria and frequency.   Musculoskeletal: Negative for arthralgias.       Left hip pain  Skin: Negative for itching and rash.  Neurological: Negative for dizziness, light-headedness and numbness.  Hematological: Negative for adenopathy. Does not bruise/bleed easily.  Psychiatric/Behavioral: Positive for sleep disturbance. Negative for confusion.    MEDICAL HISTORY:  Past Medical History:  Diagnosis Date  . Abnormal chest CT 02/2018   a. Ectatic 3 cm Ao arch - rec f/u outpt imaging w/ CTA or MRA. Ectatic atheromatous abd Ao @ risk for aneurysm - rec f/u u/s in 5 yrs. Marked prostatic enlargement w/ scattered small sclerotic foci in the thoracic lower lumbar spine, sacrum, left 12th rib, pelvis, and right femur.  Recommend elective outpatient whole-body bone scintigraphy and PSA.  Marland Kitchen CAD (coronary artery disease)    a. 02/2018 ACS/PCI: LM nl, LAD 85p (3.5x15 Sierra DES), D1 45ost, RI 55ost, RCA nl, EF 25-35%.  . Cardiac murmur   . Complete left bundle branch block (LBBB) 2016  . Dyspnea   . HFrEF (heart failure with reduced ejection fraction) ([place])   . Hypertension   . Ischemic cardiomyopathy 02/2018   a. 02/2018 Echo: EF 35-40% (LV gram on cath was 25-30%), mod, diff HK. mid-apicalanteroseptal, ant, and apical sev HK. RVH.-->  No notable change on echo from June 2020 and August 2020.  Marland Kitchen Myocardial infarction ([place])   .  NSTEMI (non-ST elevated myocardial infarction) ([place]) 02/2018   85% pLAD - DES PCI   . Prostate cancer metastatic to multiple sites [facility]) 11/2018  . Stroke ([place])   . TIA (transient ischemic attack) 2016  . Warthin's tumor     SURGICAL HISTORY: Past Surgical History:  Procedure Laterality Date  . COLONOSCOPY WITH PROPOFOL N/A 02/27/2020   Procedure: COLONOSCOPY WITH PROPOFOL;  Surgeon: Yetta Flock, MD;  Location: [place];  Service: Gastroenterology;  Laterality: N/A;  . CORONARY STENT INTERVENTION N/A 03/06/2018   Procedure: CORONARY STENT INTERVENTION;  Surgeon: [name], MD;  Location: [place] CV LAB;  Service: Cardiovascular: Proximal LAD 85% stenosis (and D1): DES PCI with Xience Sierra DES 3.5 mm x 15 mm - 3.9 mm  . ESOPHAGOGASTRODUODENOSCOPY (EGD) WITH PROPOFOL N/A 02/27/2020   Procedure: ESOPHAGOGASTRODUODENOSCOPY (EGD) WITH PROPOFOL;  Surgeon: Yetta Flock, MD;  Location: [facility] ENDOSCOPY;  Service: Gastroenterology;  Laterality: N/A;  . HEMOSTASIS CLIP PLACEMENT  02/27/2020   Procedure: HEMOSTASIS CLIP PLACEMENT;  Surgeon: Yetta Flock, MD;  Location: [place] ENDOSCOPY;  Service: Gastroenterology;;  . HOT HEMOSTASIS N/A 02/27/2020   Procedure: HOT HEMOSTASIS (ARGON PLASMA COAGULATION/BICAP);  Surgeon: Yetta Flock, MD;  Location: [facility] ENDOSCOPY;  Service: Gastroenterology;  Laterality: N/A;  . INTRAOPERATIVE TRANSTHORACIC ECHOCARDIOGRAM  03/06/2018   (Peri-MI) mild reduced EF 35-40%.  Diffuse hypokinesis (severe HK of the mid-apical anteroseptal, anterior and apical myocardium consistent with LAD infarct).  Unable to assess diastolic function.  Paradoxical septal motion likely related to his LBBB.  RV hypertrophy noted.  Trivial pericardial effusion noted.   Marland Kitchen LEFT HEART CATH AND CORONARY ANGIOGRAPHY N/A 03/06/2018   Procedure: LEFT HEART CATH AND CORONARY ANGIOGRAPHY;  Surgeon: [name], MD;  Location: [place] CV LAB;  Service: Cardiovascular: Proximal LAD 85% involving D1 45%.  Ostial RI 55%.  Severe LV dysfunction EF 25 to 30%.  1+ MR.  Only minimally elevated LVEDP.  Marland Kitchen ORCHIECTOMY Right 01/17/2020   Procedure: ORCHIECTOMY;  Surgeon: Abbie Sons, MD;  Location: [facility] ORS;  Service: Urology;  Laterality: Right;  . POLYPECTOMY  02/27/2020   Procedure: POLYPECTOMY;  Surgeon: Yetta Flock, MD;  Location: [facility] ENDOSCOPY;   Service: Gastroenterology;;  . PROSTATE BIOPSY N/A 01/17/2020   Procedure: BIOPSY TRANSRECTAL ULTRASONIC PROSTATE (TUBP);  Surgeon: Abbie Sons, MD;  Location: [facility] ORS;  Service: Urology;  Laterality: N/A;  . TRANSTHORACIC ECHOCARDIOGRAM  10/2018   (Most recent echo, to evaluate for TIA/CVA) : Moderate reduced EF 35 to 40%.  Moderate concentric LVH.  Mild dyskinesis of the apex and severe hypokinesis of mid apical anteroseptal and anterior wall.  Unable to assess RV pressures.  Aortic sclerosis with no stenosis.  No significant change seen    SOCIAL HISTORY: Social History   Socioeconomic History  . Marital status: Legally Separated    Spouse name: Dorothenia  . Number of children: Not on file  . Years of education: Not on file  . Highest education level: Not on file  Occupational History  . Occupation: cook  Tobacco Use  . Smoking status: Current Some Day Smoker    Packs/day: 0.00    Years: 0.00    Pack years: 0.00    Types: Cigarettes  . Smokeless tobacco: Never Used  . Tobacco comment: quit three days ago  Vaping Use  . Vaping Use: Never used  Substance and Sexual Activity  . Alcohol use: Yes    Alcohol/week: 2.0 standard drinks  Types: 2 Cans of beer per week    Comment: 2 Beers daily.  . Drug use: Yes    Frequency: 3.0 times per week    Types: Marijuana  . Sexual activity: Not Currently  Other Topics Concern  . Not on file  Social History Narrative  . Not on file   Social Determinants of Health   Financial Resource Strain: Not on file  Food Insecurity: Not on file  Transportation Needs: Not on file  Physical Activity: Not on file  Stress: Not on file  Social Connections: Not on file  Intimate Partner Violence: Not on file    FAMILY HISTORY: Family History  Problem Relation Age of Onset  . Stroke Mother   . Hypertension Mother   . Diabetes type II Mother   . Leukemia Other   . Breast cancer Other   . Stroke Brother   . CAD Neg Hx      ALLERGIES:  has No Known Allergies.  MEDICATIONS:  Current Outpatient Medications  Medication Sig Dispense Refill  . abiraterone acetate (ZYTIGA) 250 MG tablet Take 4 tablets (1,000 mg total) by mouth daily. Take on an empty stomach 1 hour before or 2 hours after a meal 120 tablet 0  . albuterol (VENTOLIN HFA) 108 (90 Base) MCG/ACT inhaler Inhale 2 puffs into the lungs every 6 (six) hours as needed for wheezing or shortness of breath. 8 g 11  . clopidogrel (PLAVIX) 75 MG tablet Take 1 tablet (75 mg total) by mouth daily.    . furosemide (LASIX) 20 MG tablet May take 20 mg tablet as needed for increase shortness of breath or swelling (Patient taking differently: Take 20 mg by mouth daily as needed for edema (shortness of breath).) 20 tablet 6  . metoprolol succinate (TOPROL-XL) 25 MG 24 hr tablet Take 1 tablet (25 mg total) by mouth daily with supper. (Patient taking differently: Take 12.5 mg by mouth daily with supper.) 45 tablet 1  . ondansetron (ZOFRAN) 4 MG tablet Take 4 mg by mouth every 4 (four) hours as needed for nausea or vomiting.     . rosuvastatin (CRESTOR) 40 MG tablet Take 1 tablet by mouth once daily (Patient taking differently: Take 40 mg by mouth daily.) 30 tablet 1  . sacubitril-valsartan (ENTRESTO) 24-26 MG Take 1/2 tablet in the morning and 1 tablet in the evening (Patient taking differently: Take 0.5 tablets by mouth 2 (two) times daily.) 60 tablet 8  . spironolactone (ALDACTONE) 25 MG tablet Take 0.5 tablets (12.5 mg total) by mouth daily. 30 tablet 0  . tamsulosin (FLOMAX) 0.4 MG CAPS capsule Take 1 capsule (0.4 mg total) by mouth daily after supper. 90 capsule 3  . venlafaxine XR (EFFEXOR XR) 37.5 MG 24 hr capsule Take 1 capsule (37.5 mg total) by mouth daily with breakfast. 30 capsule 1  . nitroGLYCERIN (NITROSTAT) 0.4 MG SL tablet Place 1 tablet (0.4 mg total) under the tongue every 5 (five) minutes as needed for chest pain. (Patient not taking: No sig reported) 25  tablet 3  . predniSONE (DELTASONE) 5 MG tablet Take 1 tablet (5 mg total) by mouth daily with breakfast. 30 tablet 3   No current facility-administered medications for this visit.     PHYSICAL EXAMINATION: ECOG PERFORMANCE STATUS: 1 - Symptomatic but completely ambulatory Vitals:   03/16/20 0854  BP: (!) 158/84  Pulse: 74  Resp: 16  Temp: (!) 96.4 F (35.8 C)   Filed Weights   03/16/20 0854  Weight:  160 lb 3.2 oz (72.7 kg)    Physical Exam Constitutional:      General: He is not in acute distress.    Comments: Thin built.   HENT:     Head: Normocephalic and atraumatic.  Eyes:     General: No scleral icterus. Neck:     Comments: Palpable right cervical mass Cardiovascular:     Rate and Rhythm: Normal rate and regular rhythm.     Heart sounds: Murmur heard.    Pulmonary:     Effort: Pulmonary effort is normal. No respiratory distress.     Breath sounds: No wheezing.  Abdominal:     General: Bowel sounds are normal. There is no distension.     Palpations: Abdomen is soft.  Musculoskeletal:        General: No deformity. Normal range of motion.     Cervical back: Normal range of motion and neck supple.  Skin:    General: Skin is warm and dry.     Findings: No erythema or rash.  Neurological:     Mental Status: He is alert and oriented to person, place, and time. Mental status is at baseline.     Cranial Nerves: No cranial nerve deficit.     Coordination: Coordination normal.  Psychiatric:        Mood and Affect: Mood normal.     LABORATORY DATA:  I have reviewed the data as listed Lab Results  Component Value Date   WBC 9.6 02/28/2020   HGB 10.0 (L) 02/28/2020   HCT 30.9 (L) 02/28/2020   MCV 82.8 02/28/2020   PLT 351 02/28/2020   Recent Labs    06/07/19 1253 07/26/19 1450 07/26/19 1450 11/01/19 1021 12/15/19 0920 02/10/20 1000 02/22/20 2137 02/25/20 0326 02/27/20 0411 02/28/20 0302  NA  --  134*   < > 138 138 132*   < > 134* 137 138  K  --   4.5   < > 4.3 3.8 4.3   < > 3.7 3.5 3.4*  CL  --  99   < > 102 100 96*   < > 97* 102 99  CO2  --  23   < > 25 27 24    < > 27 25 27   GLUCOSE  --  111*   < > 107* 104* 126*   < > 106* 89 103*  BUN  --  14   < > 11 8 14    < > 9 5* 7  CREATININE  --  0.74   < > 0.78 0.70 0.92   < > 0.79 0.74 0.77  CALCIUM  --  9.5   < > 9.2 9.5 9.1   < > 9.3 9.7 9.8  GFRNONAA  --  >60   < > >60 >60 >60   < > >60 >60 >60  GFRAA  --  >60  --  >60  --   --   --   --   --   --   PROT 7.0  --   --  7.6 7.6 7.1  --   --   --   --   ALBUMIN 4.2  --   --  3.8 3.7 3.7  --   --   --   --   AST 91*  --   --  44* 67* 64*  --   --   --   --   ALT 69*  --   --  31 54* 39  --   --   --   --  ALKPHOS 103  --   --  78 90 53  --   --   --   --   BILITOT 0.2  --   --  0.5 0.6 0.6  --   --   --   --   BILIDIR 0.10  --   --   --   --   --   --   --   --   --    < > = values in this interval not displayed.   Iron/TIBC/Ferritin/ %Sat No results found for: IRON, TIBC, FERRITIN, IRONPCTSAT    RADIOGRAPHIC STUDIES: I have personally reviewed the radiological images as listed and agreed with the findings in the report. DG Chest 2 View  Result Date: 02/22/2020 CLINICAL DATA:  Chest pain EXAM: CHEST - 2 VIEW COMPARISON:  11/03/2018 FINDINGS: Heart and mediastinal contours are within normal limits. No focal opacities or effusions. No acute bony abnormality. IMPRESSION: No active cardiopulmonary disease. Electronically Signed   By: Rolm Baptise M.D.   On: 02/22/2020 21:50   CT HEAD WO CONTRAST  Result Date: 02/23/2020 CLINICAL DATA:  Right arm numbness. EXAM: CT HEAD WITHOUT CONTRAST TECHNIQUE: Contiguous axial images were obtained from the base of the skull through the vertex without intravenous contrast. COMPARISON:  11/03/2018 FINDINGS: Brain: No acute intracranial abnormality. Specifically, no hemorrhage, hydrocephalus, mass lesion, acute infarction, or significant intracranial injury. Vascular: No hyperdense vessel or unexpected  calcification. Skull: No acute calvarial abnormality. Sinuses/Orbits: Visualized paranasal sinuses and mastoids clear. Orbital soft tissues unremarkable. Other: None IMPRESSION: Normal study. Electronically Signed   By: Rolm Baptise M.D.   On: 02/23/2020 00:44   NM PET Image Initial (PI) Skull Base To Thigh  Result Date: 03/14/2020 CLINICAL DATA:  Subsequent treatment strategy for testicular carcinoma. EXAM: NUCLEAR MEDICINE PET SKULL BASE TO THIGH TECHNIQUE: 7.3 mCi F-18 FDG was injected intravenously. Full-ring PET imaging was performed from the skull base to thigh after the radiotracer. CT data was obtained and used for attenuation correction and anatomic localization. 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 The mass is hypermetabolic but stable in size over multiple comparison exams and most recently described on neck CT 11/03/2018. Incidental CT findings: none CHEST: No hypermetabolic mediastinal or hilar nodes. Two adjacent sub 5 mm pulmonary nodules are present in the RIGHT middle lobe on image 98/3. These are not changed from most recent CT of the chest (10/31/2019) or more remote CT 01/31/2019. No new pulmonary nodules. Incidental CT findings: none ABDOMEN/PELVIS: Hypermetabolic lymph node along the RIGHT common iliac artery measures 9 mm (image 125) with SUV max equal 4.3. Second hypermetabolic RIGHT external iliac lymph node measures 8 mm on image 113 with SUV max equal 3.9. More distal RIGHT external iliac node measures 10 mm on image 102 with SUV max equal 5.8 No hypermetabolic lymph nodes outside the pelvis. No abnormal activity in liver. Prostate unremarkable. Incidental CT findings: Post RIGHT orchectomy. SKELETON: No focal hypermetabolic activity to suggest skeletal metastasis. Multiple small round sclerotic lesions scattered  throughout the spine without metabolic activity. Sclerotic lesions are stable compared to CT 01/31/2019 Incidental CT findings: none IMPRESSION: 1. Chain of small hypermetabolic lymph nodes along the RIGHT iliac vessels is most consistent with nodal metastasis. 2. No evidence of metastatic adenopathy outside the pelvis. 3. No evidence of visceral metastasis or skeletal metastasis.  4. Multiple small sclerotic lesions in the skeleton not changed from comparison exam. No hypermetabolic activity. 5. Hypermetabolic nodule enlargement of the RIGHT parotid gland stable over multiple comparison exams. Findings most consistent benign parotid neoplasm. Electronically Signed   By: Suzy Bouchard M.D.   On: 03/14/2020 14:57   ECHOCARDIOGRAM COMPLETE  Result Date: 02/23/2020    ECHOCARDIOGRAM REPORT   Patient Name:   Daniel Weiss Date of Exam: 02/23/2020 Medical Rec #:  [id number]    Height:       72.0 in Accession #:    [id number]   Weight:       152.0 lb Date of Birth:  Sep 07, 1960   BSA:          1.896 m Patient Age:    70 years     BP:           105/81 mmHg Patient Gender: M            HR:           96 bpm. Exam Location:  Inpatient Procedure: 2D Echo, Cardiac Doppler and Color Doppler Indications:    R07.9* Chest pain, unspecified  History:        Patient has prior history of Echocardiogram examinations, most                 recent 11/04/2018. Cardiomyopathy, Previous Myocardial Infarction                 and CAD, TIA and Stroke, Arrythmias:LBBB,                 Signs/Symptoms:Dyspnea; Risk Factors:Hypertension.  Sonographer:    Bernadene Person RDCS Referring Phys: [id number] [place]  1. Akinesis of the distal anteroseptal and apical walls with overall moderate to severe LV dysfunction.  2. Left ventricular ejection fraction, by estimation, is 30 to 35%. The left ventricle has moderate to severely decreased function. The left ventricle demonstrates regional wall motion abnormalities (see scoring  diagram/findings for description). Left ventricular diastolic parameters are consistent with Grade II diastolic dysfunction (pseudonormalization).  3. Right ventricular systolic function is normal. The right ventricular size is normal.  4. The mitral valve is normal in structure. No evidence of mitral valve regurgitation. No evidence of mitral stenosis.  5. The aortic valve has an indeterminant number of cusps. Aortic valve regurgitation is trivial. No aortic stenosis is present.  6. The inferior vena cava is normal in size with greater than 50% respiratory variability, suggesting right atrial pressure of 3 mmHg. FINDINGS  Left Ventricle: Left ventricular ejection fraction, by estimation, is 30 to 35%. The left ventricle has moderate to severely decreased function. The left ventricle demonstrates regional wall motion abnormalities. The left ventricular internal cavity size was normal in size. There is no left ventricular hypertrophy. Abnormal (paradoxical) septal motion, consistent with left bundle branch block. Left ventricular diastolic parameters are consistent with Grade II diastolic dysfunction (pseudonormalization). Right Ventricle: The right ventricular size is normal. Right ventricular systolic function is normal. Left Atrium: Left atrial size was normal in size. Right Atrium: Right atrial size was normal in size. Pericardium: There is no evidence of pericardial effusion. Mitral Valve: The mitral valve is normal in structure. No evidence of mitral valve regurgitation. No evidence of mitral valve stenosis. Tricuspid Valve: The tricuspid valve is normal in structure. Tricuspid valve regurgitation is trivial. No evidence of tricuspid stenosis. Aortic Valve: The aortic valve has an indeterminant number of cusps. Aortic valve regurgitation is  trivial. No aortic stenosis is present. Pulmonic Valve: The pulmonic valve was not well visualized. Pulmonic valve regurgitation is not visualized. No evidence of pulmonic  stenosis. Aorta: The aortic root is normal in size and structure. Venous: The inferior vena cava is normal in size with greater than 50% respiratory variability, suggesting right atrial pressure of 3 mmHg. IAS/Shunts: No atrial level shunt detected by color flow Doppler. Additional Comments: Akinesis of the distal anteroseptal and apical walls with overall moderate to severe LV dysfunction.  LEFT VENTRICLE PLAX 2D LVIDd:         4.30 cm  Diastology LVIDs:         3.10 cm  LV e' medial:    6.85 cm/s LV PW:         0.90 cm  LV E/e' medial:  14.6 LV IVS:        1.00 cm  LV e' lateral:   9.32 cm/s LVOT diam:     2.10 cm  LV E/e' lateral: 10.7 LV SV:         94 LV SV Index:   49 LVOT Area:     3.46 cm  RIGHT VENTRICLE RV S prime:     10.10 cm/s TAPSE (M-mode): 2.2 cm LEFT ATRIUM             Index       RIGHT ATRIUM           Index LA diam:        3.00 cm 1.58 cm/m  RA Area:     11.90 cm LA Vol (A2C):   24.6 ml 12.97 ml/m RA Volume:   24.40 ml  12.87 ml/m LA Vol (A4C):   19.2 ml 10.13 ml/m LA Biplane Vol: 22.6 ml 11.92 ml/m  AORTIC VALVE LVOT Vmax:   169.00 cm/s LVOT Vmean:  116.000 cm/s LVOT VTI:    0.270 m  AORTA Ao Root diam: 3.00 cm Ao Asc diam:  2.90 cm MITRAL VALVE MV Area (PHT): 6.83 cm     SHUNTS MV Decel Time: 111 msec     Systemic VTI:  0.27 m MV E velocity: 100.00 cm/s  Systemic Diam: 2.10 cm MV A velocity: 63.40 cm/s MV E/A ratio:  1.58 Kirk Ruths MD Electronically signed by Kirk Ruths MD Signature Date/Time: 02/23/2020/11:30:22 AM    Final    VAS US CAROTID  Result Date: 02/24/2020 Carotid Arterial Duplex Study Indications:       Syncope. Risk Factors:      Hypertension, hyperlipidemia, current smoker, coronary artery                    disease. Other Factors:     Prostate cancer, ischemic cardiomyopathy, Warthin's tumor,                    right neck. Comparison Study:  Prior study indicating 1-39% ICA stenosis, from 07/07/14, is                    available for comparison Performing  Technologist: Sharion Dove RVS  Examination Guidelines: A complete evaluation includes B-mode imaging, spectral Doppler, color Doppler, and power Doppler as needed of all accessible portions of each vessel. Bilateral testing is considered an integral part of a complete examination. Limited examinations for reoccurring indications may be performed as noted.  Right Carotid Findings: +----------+--------+--------+--------+------------------+--------+           PSV cm/sEDV cm/sStenosisPlaque DescriptionComments +----------+--------+--------+--------+------------------+--------+ CCA Prox  122  29                                         +----------+--------+--------+--------+------------------+--------+ CCA Distal110     29                                         +----------+--------+--------+--------+------------------+--------+ ICA Prox  45      16                                         +----------+--------+--------+--------+------------------+--------+ ICA Distal65      29                                         +----------+--------+--------+--------+------------------+--------+ ECA       50      10                                         +----------+--------+--------+--------+------------------+--------+ +----------+--------+-------+--------+-------------------+           PSV cm/sEDV cmsDescribeArm Pressure (mmHG) +----------+--------+-------+--------+-------------------+ [id number]                                         +----------+--------+-------+--------+-------------------+ +---------+--------+--+--------+--+ VertebralPSV cm/s82EDV cm/s29 +---------+--------+--+--------+--+  Left Carotid Findings: +----------+--------+--------+--------+------------------+--------+           PSV cm/sEDV cm/sStenosisPlaque DescriptionComments +----------+--------+--------+--------+------------------+--------+ CCA Prox  133     36                                          +----------+--------+--------+--------+------------------+--------+ CCA Distal72      23                                         +----------+--------+--------+--------+------------------+--------+ ICA Prox  59      28                                         +----------+--------+--------+--------+------------------+--------+ ICA Distal95      38                                         +----------+--------+--------+--------+------------------+--------+ ECA       62      17                                         +----------+--------+--------+--------+------------------+--------+ +----------+--------+--------+--------+-------------------+           PSV cm/sEDV cm/sDescribeArm Pressure (mmHG) +----------+--------+--------+--------+-------------------+  [id number]                                          +----------+--------+--------+--------+-------------------+ +---------+--------+--+--------+-+ VertebralPSV cm/s13EDV cm/s4 +---------+--------+--+--------+-+   Summary:   *See table(s) above for measurements and observations.  Electronically signed by Monica Martinez MD on 02/24/2020 at 4:47:27 PM.    Final       ASSESSMENT & PLAN:  1. 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 We discussed about upfront docetaxel versus Abiateron/prednisone.  He is not interested in chemotherapy. We'll further discuss Abiateron/prednisone option once he finishes radiation treatments for germ cell tumor.  #Germ cell tumor, stage II  03/14/20 PET was reviewed and discussed with patient. - small  hypermetabolic lymph nodes along the RIGHT iliac vessels is most consistent with nodal metastasis. Patient will establish care with radiation oncology for radiation.  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  Refer to ENT for further evaluation.  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  Not hypermetabolic on PET scan.   All questions were answered. The patient knows to call the clinic with any problems questions or concerns.  Return of visit: 1-2 weeks after he finishes radiation.  Earlie Server, MD, PhD Hematology Oncology [facility] Pager- [phone] 03/16/2020

## 2020-03-16 NOTE — Progress Notes (Signed)
Patient denies new problems/concerns today.   °

## 2020-03-19 ENCOUNTER — Ambulatory Visit: Payer: Medicaid Other | Admitting: Radiation Oncology

## 2020-03-20 ENCOUNTER — Encounter: Payer: Self-pay | Admitting: Family Medicine

## 2020-03-20 ENCOUNTER — Other Ambulatory Visit: Payer: Self-pay

## 2020-03-20 ENCOUNTER — Other Ambulatory Visit: Payer: Self-pay | Admitting: Oncology

## 2020-03-20 ENCOUNTER — Ambulatory Visit (INDEPENDENT_AMBULATORY_CARE_PROVIDER_SITE_OTHER): Payer: Medicaid Other | Admitting: Family Medicine

## 2020-03-20 VITALS — BP 135/77 | HR 59 | Temp 97.1°F | Resp 16 | Ht 72.0 in | Wt 164.0 lb

## 2020-03-20 DIAGNOSIS — I10 Essential (primary) hypertension: Secondary | ICD-10-CM

## 2020-03-20 DIAGNOSIS — I252 Old myocardial infarction: Secondary | ICD-10-CM | POA: Diagnosis not present

## 2020-03-20 DIAGNOSIS — K625 Hemorrhage of anus and rectum: Secondary | ICD-10-CM | POA: Diagnosis not present

## 2020-03-20 DIAGNOSIS — Z09 Encounter for follow-up examination after completed treatment for conditions other than malignant neoplasm: Secondary | ICD-10-CM

## 2020-03-20 DIAGNOSIS — G4700 Insomnia, unspecified: Secondary | ICD-10-CM

## 2020-03-20 DIAGNOSIS — R079 Chest pain, unspecified: Secondary | ICD-10-CM | POA: Diagnosis not present

## 2020-03-20 DIAGNOSIS — C61 Malignant neoplasm of prostate: Secondary | ICD-10-CM

## 2020-03-20 MED ORDER — TRAZODONE HCL 50 MG PO TABS: 25.0000 mg | ORAL_TABLET | Freq: Every evening | ORAL | 6 refills | Status: AC | PRN

## 2020-03-20 MED FILL — predniSONE 5 MG TABS: 5 | 30 days supply | Qty: 30 | Fill #1

## 2020-03-20 NOTE — Patient Instructions (Signed)
Trazodone Tablets What is this medicine? TRAZODONE (TRAZ oh done) is used to treat depression. This medicine may be used for other purposes; ask your health care provider or pharmacist if you have questions. COMMON BRAND NAME(S): Desyrel What should I tell my health care provider before I take this medicine? They need to know if you have any of these conditions:  attempted suicide or thinking about it  bipolar disorder  bleeding problems  glaucoma  heart disease, or previous heart attack  irregular heart beat  kidney or liver disease  low levels of sodium in the blood  an unusual or allergic reaction to trazodone, other medicines, foods, dyes or preservatives  pregnant or trying to get pregnant  breast-feeding How should I use this medicine? Take this medicine by mouth with a glass of water. Follow the directions on the prescription label. Take this medicine shortly after a meal or a light snack. Take your medicine at regular intervals. Do not take your medicine more often than directed. Do not stop taking this medicine suddenly except upon the advice of your doctor. Stopping this medicine too quickly may cause serious side effects or your condition may worsen. A special MedGuide will be given to you by the pharmacist with each prescription and refill. Be sure to read this information carefully each time. Talk to your pediatrician regarding the use of this medicine in children. Special care may be needed. Overdosage: If you think you have taken too much of this medicine contact a poison control center or emergency room at once. NOTE: This medicine is only for you. Do not share this medicine with others. What if I miss a dose? If you miss a dose, take it as soon as you can. If it is almost time for your next dose, take only that dose. Do not take double or extra doses. What may interact with this medicine? Do not take this medicine with any of the following  medications:  certain medicines for fungal infections like fluconazole, itraconazole, ketoconazole, posaconazole, voriconazole  cisapride  dronedarone  linezolid  MAOIs like Carbex, Eldepryl, Marplan, Nardil, and Parnate  mesoridazine  methylene blue (injected into a vein)  pimozide  saquinavir  thioridazine This medicine may also interact with the following medications:  alcohol  antiviral medicines for HIV or AIDS  aspirin and aspirin-like medicines  barbiturates like phenobarbital  certain medicines for blood pressure, heart disease, irregular heart beat  certain medicines for depression, anxiety, or psychotic disturbances  certain medicines for migraine headache like almotriptan, eletriptan, frovatriptan, naratriptan, rizatriptan, sumatriptan, zolmitriptan  certain medicines for seizures like carbamazepine and phenytoin  certain medicines for sleep  certain medicines that treat or prevent blood clots like dalteparin, enoxaparin, warfarin  digoxin  fentanyl  lithium  NSAIDS, medicines for pain and inflammation, like ibuprofen or naproxen  other medicines that prolong the QT interval (cause an abnormal heart rhythm) like dofetilide  rasagiline  supplements like St. John's wort, kava kava, valerian  tramadol  tryptophan This list may not describe all possible interactions. Give your health care provider a list of all the medicines, herbs, non-prescription drugs, or dietary supplements you use. Also tell them if you smoke, drink alcohol, or use illegal drugs. Some items may interact with your medicine. What should I watch for while using this medicine? Tell your doctor if your symptoms do not get better or if they get worse. Visit your doctor or health care professional for regular checks on your progress. Because it may take   several weeks to see the full effects of this medicine, it is important to continue your treatment as prescribed by your  doctor. Patients and their families should watch out for new or worsening thoughts of suicide or depression. Also watch out for sudden changes in feelings such as feeling anxious, agitated, panicky, irritable, hostile, aggressive, impulsive, severely restless, overly excited and hyperactive, or not being able to sleep. If this happens, especially at the beginning of treatment or after a change in dose, call your health care professional. You may get drowsy or dizzy. Do not drive, use machinery, or do anything that needs mental alertness until you know how this medicine affects you. Do not stand or sit up quickly, especially if you are an older patient. This reduces the risk of dizzy or fainting spells. Alcohol may interfere with the effect of this medicine. Avoid alcoholic drinks. This medicine may cause dry eyes and blurred vision. If you wear contact lenses you may feel some discomfort. Lubricating drops may help. See your eye doctor if the problem does not go away or is severe. Your mouth may get dry. Chewing sugarless gum, sucking hard candy and drinking plenty of water may help. Contact your doctor if the problem does not go away or is severe. What side effects may I notice from receiving this medicine? Side effects that you should report to your doctor or health care professional as soon as possible:  allergic reactions like skin rash, itching or hives, swelling of the face, lips, or tongue  elevated mood, decreased need for sleep, racing thoughts, impulsive behavior  confusion  fast, irregular heartbeat  feeling faint or lightheaded, falls  feeling agitated, angry, or irritable  loss of balance or coordination  painful or prolonged erections  restlessness, pacing, inability to keep still  suicidal thoughts or other mood changes  tremors  trouble sleeping  seizures  unusual bleeding or bruising Side effects that usually do not require medical attention (report to your doctor  or health care professional if they continue or are bothersome):  change in sex drive or performance  change in appetite or weight  constipation  headache  muscle aches or pains  nausea This list may not describe all possible side effects. Call your doctor for medical advice about side effects. You may report side effects to FDA at 1-800-FDA-1088. Where should I keep my medicine? Keep out of the reach of children. Store at room temperature between 15 and 30 degrees C (59 to 86 degrees F). Protect from light. Keep container tightly closed. Throw away any unused medicine after the expiration date. NOTE: This sheet is a summary. It may not cover all possible information. If you have questions about this medicine, talk to your doctor, pharmacist, or health care provider.  2021 [employer]/Gold Standard (2020-01-16 14:46:11)  

## 2020-03-20 NOTE — Progress Notes (Signed)
Patient Daniel Weiss and [facility] Follow Up  Subjective:  Patient ID: Daniel Weiss, male    DOB: 10/21/1960  Age: 60 y.o. MRN: [id number]  CC:  Chief Complaint  Patient presents with  . Medical Management of Chronic Issues    Follow Up.  Marland Kitchen Health Maintenance    Discuss the need for Hepatitis C Screening.    HPI [name] is a 60 year old male who presents for Hospital Follow Up today.   Patient Active Problem List   Diagnosis Date Noted  . Hematemesis without nausea   . Benign neoplasm of colon   . GI bleed 02/24/2020  . Chest pain, rule out acute myocardial infarction 02/23/2020  . Rectal bleeding   . Absolute anemia   . Acute blood loss anemia   . Parotid mass 02/10/2020  . Goals of care, counseling/discussion 12/22/2019  . 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 Testicular mass 12/22/2019  . DOE (dyspnea on exertion) 11/28/2019  . Chest pain on exertion 11/28/2019  . History of non-ST elevation myocardial infarction (NSTEMI) 02/21/2019  . 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 History of CVA (cerebrovascular accident) 11/23/2018  . Elevated PSA 11/23/2018  . Long term (current) use of anticoagulants 11/16/2018  . Malnutrition of moderate degree 11/11/2018  . Acute cardioembolic stroke ([place])   . Acute cerebrovascular accident (CVA) ([place]) 11/03/2018  . Presence of drug coated stent in LAD coronary artery 03/26/2018  . Complete left bundle branch block (LBBB) 03/26/2018  . Coronary artery disease involving native coronary artery of native heart with angina pectoris ([place]) 03/10/2018  . Hyperlipidemia LDL goal <70 03/10/2018  . Prostate cancer ([place]) 03/10/2018  . Abnormal CT scan 03/10/2018  . Ischemic cardiomyopathy 03/10/2018  . Hypokalemia   . Tobacco abuse   . Non-ST elevation (NSTEMI) myocardial infarction ([place]) 03/06/2018  . Sinus tachycardia 03/06/2018  . Encephalopathy, hypertensive   . ARF (acute renal failure)  ([place]) 06/29/2015  . Hyponatremia 06/29/2015  . Dehydration 06/29/2015  . Prolonged QT interval 06/29/2015  . 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 H/O medication noncompliance   . Other headache syndrome   . Localized swelling, mass or lump of neck   . Neck mass   . History of TIA (transient ischemic attack)   . Paresthesia 07/07/2014  . Essential hypertension 07/07/2014  . Right sided weakness 07/07/2014  . Mass of right side of neck 07/07/2014   Current Status: Since his last office visit, he has had a hospital visit from 02/22/2020-02/28/2020 for Chest Pain and Rectal Bleeding. He is currently taking Zytiga for Prostate Cancer, per Dr. Tasia Catchings. He is due for Radiation evaluation soon. He denies visual changes, chest pain, cough, shortness of breath, heart palpitations, and falls. He has occasional headaches and dizziness with position changes. Denies severe headaches, confusion, seizures, double vision, and blurred vision, nausea and vomiting. He denies fevers, chills, fatigue, recent infections, weight loss, and night sweats. Denies GI problems such as nausea, vomiting, diarrhea, and constipation. He has no reports of blood in stools, dysuria and hematuria. No depression or anxiety reported today.  He is taking all medications as prescribed. He denies pain today.    Past Medical History:  Diagnosis Date  . Abnormal chest CT 02/2018   a. Ectatic 3 cm Ao arch - rec f/u outpt imaging w/ CTA or MRA. Ectatic atheromatous abd Ao @ risk for aneurysm - rec f/u u/s in 5 yrs. Marked prostatic enlargement w/  scattered small sclerotic foci in the thoracic lower lumbar spine, sacrum, left 12th rib, pelvis, and right femur.  Recommend elective outpatient whole-body bone scintigraphy and PSA.  Marland Kitchen CAD (coronary artery disease)    a. 02/2018 ACS/PCI: LM nl, LAD 85p (3.5x15 Sierra DES), D1 45ost, RI 55ost, RCA nl, EF 25-35%.  . Cardiac murmur   . Complete left bundle branch block (LBBB) 2016  . Dyspnea   . HFrEF  (heart failure with reduced ejection fraction) ([place])   . Hypertension   . Ischemic cardiomyopathy 02/2018   a. 02/2018 Echo: EF 35-40% (LV gram on cath was 25-30%), mod, diff HK. mid-apicalanteroseptal, ant, and apical sev HK. RVH.-->  No notable change on echo from June 2020 and August 2020.  Marland Kitchen Myocardial infarction ([place])   . NSTEMI (non-ST elevated myocardial infarction) ([place]) 02/2018   85% pLAD - DES PCI   . Prostate cancer metastatic to multiple sites [facility]) 11/2018  . Stroke ([place])   . TIA (transient ischemic attack) 2016  . Warthin's tumor     Past Surgical History:  Procedure Laterality Date  . COLONOSCOPY WITH PROPOFOL N/A 02/27/2020   Procedure: COLONOSCOPY WITH PROPOFOL;  Surgeon: Yetta Flock, MD;  Location: [place];  Service: Gastroenterology;  Laterality: N/A;  . CORONARY STENT INTERVENTION N/A 03/06/2018   Procedure: CORONARY STENT INTERVENTION;  Surgeon: [name], MD;  Location: [place] [facility];  Service: Cardiovascular: Proximal LAD 85% stenosis (and D1): DES PCI with Xience Sierra DES 3.5 mm x 15 mm - 3.9 mm  . ESOPHAGOGASTRODUODENOSCOPY (EGD) WITH PROPOFOL N/A 02/27/2020   Procedure: ESOPHAGOGASTRODUODENOSCOPY (EGD) WITH PROPOFOL;  Surgeon: Yetta Flock, MD;  Location[facility] ENDOSCOPY;  Service: Gastroenterology;  Laterality: N/A;  . HEMOSTASIS CLIP PLACEMENT  02/27/2020   Procedure: HEMOSTASIS CLIP PLACEMENT;  Surgeon: Yetta Flock, MD;  Location: [place] ENDOSCOPY;  Service: Gastroenterology;;  . HOT HEMOSTASIS N/A 02/27/2020   Procedure: HOT HEMOSTASIS (ARGON PLASMA COAGULATION/BICAP);  Surgeon: Yetta Flock, MD;  Location: [facility] ENDOSCOPY;  Service: Gastroenterology;  Laterality: N/A;  . INTRAOPERATIVE TRANSTHORACIC ECHOCARDIOGRAM  03/06/2018   (Peri-MI) mild reduced EF 35-40%.  Diffuse hypokinesis (severe HK of the mid-apical anteroseptal, anterior and apical myocardium consistent with LAD infarct).  Unable to assess diastolic  function.  Paradoxical septal motion likely related to his LBBB.  RV hypertrophy noted.  Trivial pericardial effusion noted.   Marland Kitchen LEFT HEART CATH AND CORONARY ANGIOGRAPHY N/A 03/06/2018   Procedure: LEFT HEART CATH AND CORONARY ANGIOGRAPHY;  Surgeon: [name], MD;  Location: [place] [facility];  Service: Cardiovascular: Proximal LAD 85% involving D1 45%.  Ostial RI 55%.  Severe LV dysfunction EF 25 to 30%.  1+ MR.  Only minimally elevated LVEDP.  Marland Kitchen ORCHIECTOMY Right 01/17/2020   Procedure: ORCHIECTOMY;  Surgeon: Abbie Sons, MD;  Location: [facility] ORS;  Service: Urology;  Laterality: Right;  . POLYPECTOMY  02/27/2020   Procedure: POLYPECTOMY;  Surgeon: Yetta Flock, MD;  Location: [facility] ENDOSCOPY;  Service: Gastroenterology;;  . PROSTATE BIOPSY N/A 01/17/2020   Procedure: BIOPSY TRANSRECTAL ULTRASONIC PROSTATE (TUBP);  Surgeon: Abbie Sons, MD;  Location: [facility] ORS;  Service: Urology;  Laterality: N/A;  . TRANSTHORACIC ECHOCARDIOGRAM  10/2018   (Most recent echo, to evaluate for TIA/CVA) : Moderate reduced EF 35 to 40%.  Moderate concentric LVH.  Mild dyskinesis of the apex and severe hypokinesis of mid apical anteroseptal and anterior wall.  Unable to assess RV pressures.  Aortic sclerosis with no stenosis.  No significant change seen    Family History  Problem Relation Age of Onset  . Stroke Mother   . Hypertension Mother   . Diabetes type II Mother   . Leukemia Other   . Breast cancer Other   . Stroke Brother   . CAD Neg Hx     Social History   Socioeconomic History  . Marital status: Legally Separated    Spouse name: Dorothenia  . Number of children: Not on file  . Years of education: Not on file  . Highest education level: Not on file  Occupational History  . Occupation: cook  Tobacco Use  . Smoking status: Current Some Day Smoker    Packs/day: 0.00    Years: 0.00    Pack years: 0.00    Types: Cigarettes  . Smokeless tobacco: Never Used  . Tobacco  comment: quit three days ago  Vaping Use  . Vaping Use: Never used  Substance and Sexual Activity  . Alcohol use: Yes    Alcohol/week: 2.0 standard drinks    Types: 2 Cans of beer per week    Comment: 2 Beers daily.  . Drug use: Yes    Frequency: 3.0 times per week    Types: Marijuana  . Sexual activity: Not Currently  Other Topics Concern  . Not on file  Social History Narrative  . Not on file   Social Determinants of Health   Financial Resource Strain: Not on file  Food Insecurity: Not on file  Transportation Needs: Not on file  Physical Activity: Not on file  Stress: Not on file  Social Connections: Not on file  Intimate Partner Violence: Not on file    Outpatient Medications Prior to Visit  Medication Sig Dispense Refill  . abiraterone acetate (ZYTIGA) 250 MG tablet Take 4 tablets (1,000 mg total) by mouth daily. Take on an empty stomach 1 hour before or 2 hours after a meal 120 tablet 0  . albuterol (VENTOLIN HFA) 108 (90 Base) MCG/ACT inhaler Inhale 2 puffs into the lungs every 6 (six) hours as needed for wheezing or shortness of breath. 8 g 11  . clopidogrel (PLAVIX) 75 MG tablet Take 1 tablet (75 mg total) by mouth daily.    . furosemide (LASIX) 20 MG tablet May take 20 mg tablet as needed for increase shortness of breath or swelling (Patient taking differently: Take 20 mg by mouth daily as needed for edema (shortness of breath).) 20 tablet 6  . metoprolol succinate (TOPROL-XL) 25 MG 24 hr tablet Take 1 tablet (25 mg total) by mouth daily with supper. (Patient taking differently: Take 12.5 mg by mouth daily with supper.) 45 tablet 1  . ondansetron (ZOFRAN) 4 MG tablet Take 4 mg by mouth every 4 (four) hours as needed for nausea or vomiting.     . predniSONE (DELTASONE) 5 MG tablet Take 1 tablet (5 mg total) by mouth daily with breakfast. 30 tablet 3  . rosuvastatin (CRESTOR) 40 MG tablet Take 1 tablet by mouth once daily (Patient taking differently: Take 40 mg by mouth  daily.) 30 tablet 1  . sacubitril-valsartan (ENTRESTO) 24-26 MG Take 1/2 tablet in the morning and 1 tablet in the evening (Patient taking differently: Take 0.5 tablets by mouth 2 (two) times daily.) 60 tablet 8  . spironolactone (ALDACTONE) 25 MG tablet Take 0.5 tablets (12.5 mg total) by mouth daily. 30 tablet 0  . tamsulosin (FLOMAX) 0.4 MG CAPS capsule Take 1 capsule (0.4 mg total) by mouth  daily after supper. 90 capsule 3  . venlafaxine XR (EFFEXOR XR) 37.5 MG 24 hr capsule Take 1 capsule (37.5 mg total) by mouth daily with breakfast. 30 capsule 1  . nitroGLYCERIN (NITROSTAT) 0.4 MG SL tablet Place 1 tablet (0.4 mg total) under the tongue every 5 (five) minutes as needed for chest pain. (Patient not taking: Reported on 03/20/2020) 25 tablet 3   No facility-administered medications prior to visit.    No Known Allergies  ROS Review of Systems  Constitutional: Negative.   HENT: Negative.   Eyes: Negative.   Respiratory: Negative.   Cardiovascular: Negative.   Gastrointestinal: Negative.   Endocrine: Negative.   Genitourinary: Negative.   Musculoskeletal: Negative.   Skin: Negative.   Allergic/Immunologic: Negative.   Neurological: Positive for dizziness (occasional ) and headaches (occasional ).  Hematological: Negative.   Psychiatric/Behavioral: Negative.       Objective:    Physical Exam Vitals and nursing note reviewed.  Constitutional:      Appearance: Normal appearance.  HENT:     Head: Normocephalic and atraumatic.     Nose: Nose normal.     Mouth/Throat:     Mouth: Mucous membranes are moist.     Pharynx: Oropharynx is clear.  Cardiovascular:     Rate and Rhythm: Normal rate and regular rhythm.     Pulses: Normal pulses.     Heart sounds: Normal heart sounds.  Pulmonary:     Effort: Pulmonary effort is normal.     Breath sounds: Normal breath sounds.  Abdominal:     General: Bowel sounds are normal.  Musculoskeletal:        General: Normal range of motion.      Cervical back: Normal range of motion and neck supple.  Skin:    General: Skin is warm and dry.  Neurological:     General: No focal deficit present.     Mental Status: He is alert and oriented to person, place, and time.  Psychiatric:        Mood and Affect: Mood normal.        Behavior: Behavior normal.        Thought Content: Thought content normal.        Judgment: Judgment normal.     BP 135/77   Pulse (!) 59   Temp (!) 97.1 F (36.2 C)   Resp 16   Ht 6' (1.829 m)   Wt 164 lb (74.4 kg)   SpO2 97%   BMI 22.24 kg/m  Wt Readings from Last 3 Encounters:  03/20/20 164 lb (74.4 kg)  03/16/20 160 lb 3.2 oz (72.7 kg)  02/28/20 143 lb (64.9 kg)     Health Maintenance Due  Topic Date Due  . Hepatitis C Screening  Never done  . TETANUS/TDAP  Never done    There are no preventive care reminders to display for this patient.  Lab Results  Component Value Date   TSH 1.654 02/23/2020   Lab Results  Component Value Date   WBC 9.6 02/28/2020   HGB 10.0 (L) 02/28/2020   HCT 30.9 (L) 02/28/2020   MCV 82.8 02/28/2020   PLT 351 02/28/2020   Lab Results  Component Value Date   NA 138 02/28/2020   K 3.4 (L) 02/28/2020   CO2 27 02/28/2020   GLUCOSE 103 (H) 02/28/2020   BUN 7 02/28/2020   CREATININE 0.77 02/28/2020   BILITOT 0.6 02/10/2020   ALKPHOS 53 02/10/2020   AST 64 (H) 02/10/2020  ALT 39 02/10/2020   PROT 7.1 02/10/2020   ALBUMIN 3.7 02/10/2020   CALCIUM 9.8 02/28/2020   ANIONGAP 12 02/28/2020   Lab Results  Component Value Date   CHOL 180 02/23/2020   Lab Results  Component Value Date   HDL 54 02/23/2020   Lab Results  Component Value Date   LDLCALC 101 (H) 02/23/2020   Lab Results  Component Value Date   TRIG 123 02/23/2020   Lab Results  Component Value Date   CHOLHDL 3.3 02/23/2020   Lab Results  Component Value Date   HGBA1C 5.2 05/23/2019   Assessment & Plan:   1. Hospital discharge follow-up  2. Chest pain, unspecified  type No signs or symptoms of respiratory distress noted or reported today.   3. Rectal bleeding Resolved.   4. History of non-ST elevation myocardial infarction (NSTEMI) Stable. No signs or symptoms of recurrence noted or reported.   5. Prostate cancer ([place])  6. Insomnia, unspecified type - traZODone (DESYREL) 50 MG tablet; Take 0.5-1 tablets (25-50 mg total) by mouth at bedtime as needed for sleep.  Dispense: 30 tablet; Refill: 6  7. Essential hypertension The current medical regimen is effective; blood pressure is stable at 135/77 today; continue present plan and medications as prescribed. He will continue to take medications as prescribed, to decrease high sodium intake, excessive alcohol intake, increase potassium intake, smoking cessation, and increase physical activity of at least 30 minutes of cardio activity daily. He will continue to follow Heart Healthy or DASH diet.  8. Follow up He will follow up in 3 months.  Meds ordered this encounter  Medications  . traZODone (DESYREL) 50 MG tablet    Sig: Take 0.5-1 tablets (25-50 mg total) by mouth at bedtime as needed for sleep.    Dispense:  30 tablet    Refill:  6    No orders of the defined types were placed in this encounter.   Referral Orders  No referral(s) requested today    Kathe Becton, MSN, ANE, FNP-BC [facility] [place] [address] [place], [place] [address] [phone]- fax   Problem List Items Addressed This Visit      Cardiovascular and Mediastinum   Essential hypertension (Chronic)     Digestive   Rectal bleeding     Genitourinary   Prostate cancer ([place]) (Chronic)     Other   History of non-ST elevation myocardial infarction (NSTEMI)    Other Visit Diagnoses    Hospital discharge follow-up    -  Primary   Chest pain, unspecified type       Insomnia, unspecified type       Relevant Medications    traZODone (DESYREL) 50 MG tablet   Follow up          Meds ordered this encounter  Medications  . traZODone (DESYREL) 50 MG tablet    Sig: Take 0.5-1 tablets (25-50 mg total) by mouth at bedtime as needed for sleep.    Dispense:  30 tablet    Refill:  6    Follow-up: No follow-ups on file.    Azzie Glatter, FNP

## 2020-03-22 ENCOUNTER — Encounter: Payer: Self-pay | Admitting: Radiation Oncology

## 2020-03-22 ENCOUNTER — Other Ambulatory Visit: Payer: Self-pay

## 2020-03-22 ENCOUNTER — Ambulatory Visit
Admission: RE | Admit: 2020-03-22 | Discharge: 2020-03-22 | Disposition: A | Discharge status: Home / Self Care | Payer: Medicaid Other | Source: Ambulatory Visit | Attending: Radiation Oncology | Admitting: Radiation Oncology

## 2020-03-22 ENCOUNTER — Ambulatory Visit: Payer: Medicaid Other | Admitting: Radiation Oncology

## 2020-03-22 ENCOUNTER — Encounter: Payer: Self-pay | Admitting: Family Medicine

## 2020-03-22 VITALS — BP 126/76 | HR 96 | Temp 97.2°F | Resp 16 | Wt 159.4 lb

## 2020-03-22 DIAGNOSIS — C61 Malignant neoplasm of prostate: Secondary | ICD-10-CM | POA: Insufficient documentation

## 2020-03-22 DIAGNOSIS — C778 Secondary and unspecified malignant neoplasm of lymph nodes of multiple regions: Secondary | ICD-10-CM | POA: Insufficient documentation

## 2020-03-22 DIAGNOSIS — C7951 Secondary malignant neoplasm of bone: Secondary | ICD-10-CM | POA: Diagnosis not present

## 2020-03-22 DIAGNOSIS — C801 Malignant (primary) neoplasm, unspecified: Secondary | ICD-10-CM

## 2020-03-22 DIAGNOSIS — K625 Hemorrhage of anus and rectum: Secondary | ICD-10-CM

## 2020-03-22 NOTE — Progress Notes (Signed)
Radiation Oncology Follow up Note  Name: Daniel Weiss   Date:   03/22/2020 MRN:  [id number] DOB: [birth year]    This 60 y.o. 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  Patient also.  Had a right paracaval lymph node which biopsy showed germ cell tumor possible seminoma.  His tumor markers were normal for LDH beta-hCG and AFP.  Scrotal ultrasound showed a small right testicular mass measuring 1.6 cm.  He underwent a right orchiectomy in November showing residual joint germ cell tumor and germ cell neoplasm in situ were not identified although there was a presence of scar with extensive tubular atrophy compatible with a completely regressed germ cell tumor.  We ordered a PET CT scan showing a chain of small hypermetabolic lymph nodes along the right iliac vessels consistent with nodal metastasis from seminoma.  No evidence of metastatic adenopathy outside the pelvis.  There is no evidence of visceral metastasis or skeletal metastasis.  He did have multiple small sclerotic lesions in the skeleton not changed from prior examination.  He is seen today for consideration of radiation therapy.  He has a strong cardiac history is not thought to be candidate for chemotherapy.  Patient did go to [facility] for [facility] opinion although was not seen based on extremely long wait to see the physician.  COMPLICATIONS OF TREATMENT: none  FOLLOW UP COMPLIANCE: keeps appointments   PHYSICAL EXAM:  BP 126/76 (BP Location: Left Arm, Patient Position: Sitting, Cuff Size: Normal)   Pulse 96   Temp (!) 97.2 F (36.2 C) (Tympanic)   Resp 16   Wt 159 lb 6.4 oz (72.3 kg)   BMI 21.62 kg/m  Well-developed well-nourished patient in  NAD. HEENT reveals PERLA, EOMI, discs not visualized.  Oral cavity is clear. No oral mucosal lesions are identified. Neck is clear without evidence of cervical or supraclavicular adenopathy. Lungs are clear to A&P. Cardiac examination is essentially unremarkable with regular rate and rhythm without murmur rub or thrill. Abdomen is benign with no organomegaly or masses noted. Motor sensory and DTR levels are equal and symmetric in the upper and lower extremities. Cranial nerves II through XII are grossly intact. Proprioception is intact. No peripheral adenopathy or edema is identified. No motor or sensory levels are noted. Crude visual fields are within normal range.  RADIOLOGY RESULTS: PET CT scan reviewed compatible with above-stated findings  PLAN: At this time lets go ahead with radiation therapy to his periotic lymph nodes as well as his right hemipelvic nodes.  Would plan on delivering 30 Gray over 3 weeks.  Would also boost the areas of PET positive lymph nodes another 600 cGy.  Risks and benefits of treatment including possibility of diarrhea fatigue alteration of blood counts possible increased lower urinary tract symptoms all were described in detail to the patient.  I have personally set up and ordered CT simulation in about a week's time.  Patient comprehends my recommendations well.  I would like to take this opportunity to thank you for allowing me to participate in the care of your patient.Noreene Filbert, MD

## 2020-03-23 ENCOUNTER — Ambulatory Visit: Payer: Medicaid Other | Admitting: Radiation Oncology

## 2020-03-27 ENCOUNTER — Other Ambulatory Visit: Payer: Self-pay | Admitting: Cardiology

## 2020-04-02 ENCOUNTER — Ambulatory Visit: Payer: Medicaid Other

## 2020-04-02 ENCOUNTER — Ambulatory Visit: Payer: Medicaid Other | Admitting: Cardiology

## 2020-04-04 ENCOUNTER — Ambulatory Visit
Admission: RE | Admit: 2020-04-04 | Discharge: 2020-04-04 | Disposition: A | Discharge status: Home / Self Care | Payer: Medicaid Other | Source: Ambulatory Visit | Attending: Radiation Oncology | Admitting: Radiation Oncology

## 2020-04-04 ENCOUNTER — Other Ambulatory Visit: Payer: Self-pay | Admitting: Urology

## 2020-04-04 ENCOUNTER — Encounter: Payer: Self-pay | Admitting: Family Medicine

## 2020-04-04 DIAGNOSIS — C61 Malignant neoplasm of prostate: Secondary | ICD-10-CM | POA: Diagnosis not present

## 2020-04-06 ENCOUNTER — Other Ambulatory Visit: Payer: Self-pay | Admitting: *Deleted

## 2020-04-06 ENCOUNTER — Other Ambulatory Visit: Payer: Self-pay | Admitting: Otolaryngology

## 2020-04-06 DIAGNOSIS — C801 Malignant (primary) neoplasm, unspecified: Secondary | ICD-10-CM

## 2020-04-09 ENCOUNTER — Telehealth: Payer: Self-pay | Admitting: *Deleted

## 2020-04-09 ENCOUNTER — Ambulatory Visit: Payer: Medicaid Other

## 2020-04-09 DIAGNOSIS — C61 Malignant neoplasm of prostate: Secondary | ICD-10-CM | POA: Diagnosis not present

## 2020-04-09 NOTE — Telephone Encounter (Signed)
   [facility] Pre-operative Risk Assessment    HEARTCARE STAFF: - Please ensure there is not already an duplicate clearance open for this procedure. - Under Visit Info/Reason for Call, type in Other and utilize the format Clearance MM/DD/YY or Clearance TBD. Do not use dashes or single digits. - If request is for dental extraction, please clarify the # of teeth to be extracted.  Request for surgical clearance:  1. What type of surgery is being performed? [place] core biopsy of right neck mass  2. When is this surgery scheduled? TBD  3. What type of clearance is required (medical clearance vs. Pharmacy clearance to hold med vs. Both)? both  4. Are there any medications that need to be held prior to surgery and how long?plavix-need instruction  5. Practice name and name of physician performing surgery? [facility]  6. What is the office phone number? [phone]   7.   What is the office fax number? [phone] attn sandy  8.   Anesthesia type (None, local, MAC, general) ? Not listed   Fredia Beets 04/09/2020, 8:34 AM  _________________________________________________________________   (provider comments below)

## 2020-04-09 NOTE — Telephone Encounter (Signed)
   Primary Cardiologist: Glenetta Hew, MD  Chart reviewed and patient contacted today by phone as part of pre-operative protocol coverage. Given past medical history and time since last visit, based on ACC/AHA guidelines, Daniel Weiss would be at acceptable risk for the planned procedure without further cardiovascular testing.   OK to hold Plavix 5-7 days pre op if needed, resume as soon as safe post op.  The patient was advised that if he develops new symptoms prior to surgery to contact our office to arrange for a follow-up visit, and he verbalized understanding.  I will route this recommendation to the requesting party via Epic fax function and remove from pre-op pool.  Please call with questions.  Kerin Ransom, PA-C 04/09/2020, 10:25 AM

## 2020-04-10 ENCOUNTER — Ambulatory Visit: Payer: Medicaid Other

## 2020-04-11 ENCOUNTER — Ambulatory Visit: Admission: RE | Admit: 2020-04-11 | Payer: Medicaid Other | Source: Ambulatory Visit

## 2020-04-11 ENCOUNTER — Other Ambulatory Visit: Payer: Self-pay | Admitting: Otolaryngology

## 2020-04-11 ENCOUNTER — Ambulatory Visit: Payer: Medicaid Other

## 2020-04-11 DIAGNOSIS — C61 Malignant neoplasm of prostate: Secondary | ICD-10-CM | POA: Diagnosis not present

## 2020-04-11 DIAGNOSIS — K118 Other diseases of salivary glands: Secondary | ICD-10-CM

## 2020-04-12 ENCOUNTER — Ambulatory Visit
Admission: RE | Admit: 2020-04-12 | Discharge: 2020-04-12 | Disposition: A | Discharge status: Home / Self Care | Payer: Medicaid Other | Source: Ambulatory Visit | Attending: Radiation Oncology | Admitting: Radiation Oncology

## 2020-04-12 ENCOUNTER — Other Ambulatory Visit: Payer: Self-pay | Admitting: Hematology and Oncology

## 2020-04-12 DIAGNOSIS — C61 Malignant neoplasm of prostate: Secondary | ICD-10-CM | POA: Diagnosis not present

## 2020-04-13 ENCOUNTER — Ambulatory Visit
Admission: RE | Admit: 2020-04-13 | Discharge: 2020-04-13 | Disposition: A | Discharge status: Home / Self Care | Payer: Medicaid Other | Source: Ambulatory Visit | Attending: Radiation Oncology | Admitting: Radiation Oncology

## 2020-04-13 DIAGNOSIS — C61 Malignant neoplasm of prostate: Secondary | ICD-10-CM | POA: Diagnosis not present

## 2020-04-16 ENCOUNTER — Ambulatory Visit
Admission: RE | Admit: 2020-04-16 | Discharge: 2020-04-16 | Disposition: A | Discharge status: Home / Self Care | Payer: Medicaid Other | Source: Ambulatory Visit | Attending: Radiation Oncology | Admitting: Radiation Oncology

## 2020-04-16 DIAGNOSIS — C61 Malignant neoplasm of prostate: Secondary | ICD-10-CM | POA: Diagnosis not present

## 2020-04-17 ENCOUNTER — Other Ambulatory Visit: Payer: Self-pay | Admitting: Student

## 2020-04-17 ENCOUNTER — Telehealth: Payer: Self-pay | Admitting: Cardiology

## 2020-04-17 ENCOUNTER — Ambulatory Visit
Admission: RE | Admit: 2020-04-17 | Discharge: 2020-04-17 | Disposition: A | Discharge status: Home / Self Care | Payer: Medicaid Other | Source: Ambulatory Visit | Attending: Radiation Oncology | Admitting: Radiation Oncology

## 2020-04-17 ENCOUNTER — Other Ambulatory Visit: Payer: Self-pay

## 2020-04-17 ENCOUNTER — Telehealth: Payer: Self-pay

## 2020-04-17 ENCOUNTER — Ambulatory Visit: Payer: Medicaid Other

## 2020-04-17 ENCOUNTER — Encounter: Payer: Self-pay | Admitting: Cardiology

## 2020-04-17 ENCOUNTER — Other Ambulatory Visit: Payer: Self-pay | Admitting: Radiology

## 2020-04-17 ENCOUNTER — Ambulatory Visit (INDEPENDENT_AMBULATORY_CARE_PROVIDER_SITE_OTHER): Payer: Medicaid Other | Admitting: Cardiology

## 2020-04-17 VITALS — BP 164/88 | HR 81 | Ht 72.0 in | Wt 160.0 lb

## 2020-04-17 DIAGNOSIS — C61 Malignant neoplasm of prostate: Secondary | ICD-10-CM

## 2020-04-17 DIAGNOSIS — R0600 Dyspnea, unspecified: Secondary | ICD-10-CM

## 2020-04-17 DIAGNOSIS — I5042 Chronic combined systolic (congestive) and diastolic (congestive) heart failure: Secondary | ICD-10-CM

## 2020-04-17 DIAGNOSIS — R0609 Other forms of dyspnea: Secondary | ICD-10-CM

## 2020-04-17 DIAGNOSIS — I214 Non-ST elevation (NSTEMI) myocardial infarction: Secondary | ICD-10-CM

## 2020-04-17 DIAGNOSIS — I255 Ischemic cardiomyopathy: Secondary | ICD-10-CM | POA: Diagnosis not present

## 2020-04-17 DIAGNOSIS — E860 Dehydration: Secondary | ICD-10-CM

## 2020-04-17 DIAGNOSIS — I25119 Atherosclerotic heart disease of native coronary artery with unspecified angina pectoris: Secondary | ICD-10-CM | POA: Diagnosis not present

## 2020-04-17 DIAGNOSIS — C801 Malignant (primary) neoplasm, unspecified: Secondary | ICD-10-CM

## 2020-04-17 DIAGNOSIS — I10 Essential (primary) hypertension: Secondary | ICD-10-CM

## 2020-04-17 DIAGNOSIS — E785 Hyperlipidemia, unspecified: Secondary | ICD-10-CM

## 2020-04-17 DIAGNOSIS — R000 Tachycardia, unspecified: Secondary | ICD-10-CM

## 2020-04-17 DIAGNOSIS — Z955 Presence of coronary angioplasty implant and graft: Secondary | ICD-10-CM

## 2020-04-17 DIAGNOSIS — I447 Left bundle-branch block, unspecified: Secondary | ICD-10-CM

## 2020-04-17 HISTORY — DX: Chronic combined systolic (congestive) and diastolic (congestive) heart failure: I50.42

## 2020-04-17 MED ORDER — NITROGLYCERIN 0.4 MG SL SUBL: 0.4000 mg | SUBLINGUAL_TABLET | SUBLINGUAL | 3 refills | Status: DC | PRN

## 2020-04-17 MED ORDER — SPIRONOLACTONE 25 MG PO TABS: 25.0000 mg | ORAL_TABLET | Freq: Every day | ORAL | 3 refills | Status: DC

## 2020-04-17 MED ORDER — EZETIMIBE 10 MG PO TABS: 10.0000 mg | ORAL_TABLET | Freq: Every day | ORAL | 3 refills | Status: DC

## 2020-04-17 MED ORDER — METOPROLOL SUCCINATE ER 25 MG PO TB24: 25.0000 mg | ORAL_TABLET | Freq: Every day | ORAL | 3 refills | Status: DC

## 2020-04-17 MED ORDER — ENTRESTO 49-51 MG PO TABS: 1.0000 | ORAL_TABLET | Freq: Two times a day (BID) | ORAL | 6 refills | Status: DC

## 2020-04-17 NOTE — Progress Notes (Signed)
Primary Care Provider: Azzie Glatter, FNP Cardiologist: Glenetta Hew, MD Electrophysiologist: None  Clinic Note: Chief Complaint  Patient presents with   Follow-up    42-month.  Discussed echo results   Coronary Artery Disease    Still has intermittent chest pain mostly at rest   ===================================  ASSESSMENT/PLAN   Problem List Items Addressed This Visit    Non-ST elevation (NSTEMI) myocardial infarction [facility]) (Chronic)    He is now couple years out from his MI.  Unfortunately, was delayed presentation and had a pretty significant infarct.  Has some exertional fatigue and dyspnea as well as occasional chest pain.  Continue to titrate medications.      Relevant Medications   nitroGLYCERIN (NITROSTAT) 0.4 MG SL tablet   metoprolol succinate (TOPROL XL) 25 MG 24 hr tablet   spironolactone (ALDACTONE) 25 MG tablet   sacubitril-valsartan (ENTRESTO) 49-51 MG   ezetimibe (ZETIA) 10 MG tablet   Other Relevant Orders   EKG 12-Lead (Completed)   CBC   Lipid panel   Comprehensive metabolic panel   Coronary artery disease involving native coronary artery of native heart with angina pectoris (HCC) (Chronic)    Two-vessel disease with PCI to the LAD and PTCA of the diagonal branch.  Unfortunately no real improvement in his EF from initial PCI-likely related to sizable infarct.  I still think his chest comfort that he is having intermittently is musculoskeletal.  Happens mostly at rest.  Myoview in September for similar symptoms did not show any evidence of ischemia.  Could be microvascular in nature.  Plan:   With ongoing symptoms of chest pain we will continue clopidogrel but okay to hold 5 days preop for surgery procedures.  He is on Toprol low-dose, I have been reluctant to titrate up because of fatigue and previous hypotension. => With him having ongoing chest discomfort and now his blood pressure being higher, I think we can titrate up to 25 mg  Toprol.  Continue current dose of rosuvastatin.  Titrate up Entresto to 49 and 51 mg twice daily.      Relevant Medications   nitroGLYCERIN (NITROSTAT) 0.4 MG SL tablet   metoprolol succinate (TOPROL XL) 25 MG 24 hr tablet   spironolactone (ALDACTONE) 25 MG tablet   sacubitril-valsartan (ENTRESTO) 49-51 MG   ezetimibe (ZETIA) 10 MG tablet   Other Relevant Orders   EKG 12-Lead (Completed)   CBC   Lipid panel   Comprehensive metabolic panel   Ischemic cardiomyopathy (Chronic)    Persistently reduced EF with most recent echo showing EF of 35% and in the setting of left bundle branch block.  We are now titrating up his medications including Entresto, Toprol and spironolactone.  Not requiring diuretic.  Had reluctant to pursue discussion about CRT (either CRT-D or CRT-P) because of his stroke and then cancer.  Now that he is coming to the end of his cancer treatment I will refer to electrophysiology for evaluation for possible CRT-with EF less than 35%, would consider CRT-D especially in light of anterior infarct. Could also potentially CRT-P would regain some of the septal wall motion loss and LBBB.      Relevant Medications   nitroGLYCERIN (NITROSTAT) 0.4 MG SL tablet   metoprolol succinate (TOPROL XL) 25 MG 24 hr tablet   spironolactone (ALDACTONE) 25 MG tablet   sacubitril-valsartan (ENTRESTO) 49-51 MG   ezetimibe (ZETIA) 10 MG tablet   Other Relevant Orders   EKG 12-Lead (Completed)   CBC   Lipid panel  Comprehensive metabolic panel   Ambulatory referral to Cardiac Electrophysiology   Chronic combined systolic and diastolic CHF, NYHA class 2 (HCC) - Primary (Chronic)    No PND, orthopnea or edema.  Just exertional dyspnea.  NYHA class II symptoms mostly because of dyspnea.  I suspect some of it could be related to his cancer as well with fatigue. With better blood pressure we are able to titrate medications up.  Plan:   Gentle titration-spironolactone at 25 mg, Toprol up  to 25 mg  Increase Entresto to 49/51 mg twice daily.  Has not required furosemide, is euvolemic on exam.  He has PRN written but is not using.      Relevant Medications   nitroGLYCERIN (NITROSTAT) 0.4 MG SL tablet   metoprolol succinate (TOPROL XL) 25 MG 24 hr tablet   spironolactone (ALDACTONE) 25 MG tablet   sacubitril-valsartan (ENTRESTO) 49-51 MG   ezetimibe (ZETIA) 10 MG tablet   Other Relevant Orders   EKG 12-Lead (Completed)   CBC   Lipid panel   Comprehensive metabolic panel   Ambulatory referral to Cardiac Electrophysiology   Essential hypertension (Chronic)    Pressures are finally elevated.  Was able to tolerate the addition of Entresto last visit. Plan: Titrate Entresto to 49/51 mg twice daily, Toprol 25 mg daily and spironolactone 25 mg daily.      Relevant Medications   nitroGLYCERIN (NITROSTAT) 0.4 MG SL tablet   metoprolol succinate (TOPROL XL) 25 MG 24 hr tablet   spironolactone (ALDACTONE) 25 MG tablet   sacubitril-valsartan (ENTRESTO) 49-51 MG   ezetimibe (ZETIA) 10 MG tablet   Other Relevant Orders   Comprehensive metabolic panel   Sinus tachycardia (Chronic)    He was supposed uptitrated his Toprol dose 25 mg last visit.  We will increase today.      Hyperlipidemia LDL goal <70 (Chronic)    Labs in December showed LDL of 101.  Not at goal, but I am reluctant to go very aggressive right now with his ongoing cancer therapy.  Plan: Continue simvastatin 40 mg and add Zetia 10 mg daily. => Recheck labs in March.  If still elevated, will refer to CVRR lipid clinic.      Relevant Medications   nitroGLYCERIN (NITROSTAT) 0.4 MG SL tablet   metoprolol succinate (TOPROL XL) 25 MG 24 hr tablet   spironolactone (ALDACTONE) 25 MG tablet   sacubitril-valsartan (ENTRESTO) 49-51 MG   ezetimibe (ZETIA) 10 MG tablet   Other Relevant Orders   Lipid panel   Comprehensive metabolic panel   Presence of drug coated stent in LAD coronary artery (Chronic)    Switch back  to Plavix after being on warfarin for aortic mural thrombus.   Would like to continue Plavix/clopidogrel long-term-but okay to hold for procedures 5 days preop      Relevant Orders   EKG 12-Lead (Completed)   Lipid panel   Comprehensive metabolic panel   Complete left bundle branch block (LBBB) (Chronic)    Ongoing LBBB.  Probably since his MI. -> Follow-up echocardiogram showed EF still 30 to 35% range probably close to 35%.  Referring to EP to consider CRT-D      Relevant Medications   nitroGLYCERIN (NITROSTAT) 0.4 MG SL tablet   metoprolol succinate (TOPROL XL) 25 MG 24 hr tablet   spironolactone (ALDACTONE) 25 MG tablet   sacubitril-valsartan (ENTRESTO) 49-51 MG   ezetimibe (ZETIA) 10 MG tablet   Other Relevant Orders   EKG 12-Lead (Completed)   Lipid panel  Comprehensive metabolic panel   Ambulatory referral to Cardiac Electrophysiology   Dehydration    He has had a history of dizziness and fatigue.  I think some of his positional dizziness is probably related to dehydration.  We talked about the importance of adequate hydration, especially in light of his radiation therapy.  Recommended continued intake of at least 80 ounces of water a day.      RESOLVED: DOE (dyspnea on exertion)   Relevant Orders   EKG 12-Lead (Completed)   CBC   Lipid panel   Comprehensive metabolic panel      ===================================  HPI:    Daniel Weiss is a 60 y.o. 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  Daniel Weiss was last seen on 11/28/2019 as a preop for planned prostate biopsy.  He has noted fatigue.  Not eating well.  Intermittent chest discomfort-taking nitroglycerin.  We stopped warfarin and started clopidogrel.  (Had been on warfarin for the aortic mural thrombus)  After his  prostate biopsy.  Plan was to start Entresto 1/2 tablet twice daily   Myoview ordered.  Recent Hospitalizations:   February 22, 2020: Admitted for atypical chest pain.  Minimal troponin elevation with flat trend.  Echo showed stable EF of 35%.  Unchanged from past.  Plavix temporarily held because of GI bleed.  Reviewed  CV studies:    The following studies were reviewed today: (if available, images/films reviewed: From Epic Chart or Care Everywhere)  11/28/2019-Myoview:: EF 35%.  Size moderate severity mid anterior TAVR anteroseptal, apical anterior and septal with apical.  Apical fixed defect consistent with prior MI.  No ischemia.  Read as intermediate reduced function.  02/23/2020:-Echo: Akinesis of the distal anteroseptal and apical walls with overall moderate severe LV dysfunction ~35%.  GRII DD.   Interval History:   Daniel Weiss presents today for f/u overall doing a bit better.  He is still fatigued - but he is currently undergoing XRT (until 2/28 for Prostate Cancer -- not on PO meds anymore).  He still gets SOB with mild activity such as making the bed & light walks.  He also has intermittent episodes of CP requiring NTG SL, these episodes usually happen at rest, not with exertion.  Although he notes having some exertional dyspnea he does [place] any resting dyspnea, PND orthopnea.  No real edema.  He gets dizzy if he bends down to stand up, but not necessarily orthostatic source sitting to standing.  No tachycardia spells.  CV Review of Symptoms (Summary) Cardiovascular ROS: positive for - chest pain, dyspnea on exertion and Fatigued with lack of stamina; positional dizziness, but not orthostatic. negative for - edema, irregular heartbeat, orthopnea, palpitations, paroxysmal nocturnal dyspnea, rapid heart rate, shortness of breath or Syncope/near syncope or recurrent TIA/amaurosis fugax, claudication  PLANNING TO MAKE FINAL STEP TO SMOKING CESSATION - @ 1 CIG/DAY - ONCE xrt DONE  - PLANS TO USE PATCHES.   The patient does not have symptoms concerning for COVID-19 infection (fever, chills, cough, or new shortness of breath).   REVIEWED OF SYSTEMS   Review of Systems  Constitutional: Positive for malaise/fatigue (Does not have a lot of energy.  This is been the case since he started his radiation therapy.  Worse during the few hours post treatment.). Negative for weight loss (Able to maintain weight).  HENT:  Negative for congestion and sinus pain.   Respiratory: Negative for cough and shortness of breath (With exertion).   Gastrointestinal: Positive for blood in stool (Only sometimes with wiping). Negative for diarrhea and melena.  Genitourinary: Negative for hematuria.  Musculoskeletal: Negative for joint pain.  Neurological: Positive for dizziness (Positional, not necessarily orthostatic) and weakness (He was getting stronger at restaurant, but now feels somewhat weak during his cancer treatments.). Negative for focal weakness.  Psychiatric/Behavioral: Positive for depression. Negative for memory loss. The patient has insomnia. The patient is not nervous/anxious.    I have reviewed and (if needed) personally updated the patient's problem list, medications, allergies, past medical and surgical history, social and family history.   PAST MEDICAL HISTORY   Past Medical History:  Diagnosis Date   Abnormal chest CT 02/2018   a. Ectatic 3 cm Ao arch - rec f/u outpt imaging w/ CTA or MRA. Ectatic atheromatous abd Ao @ risk for aneurysm - rec f/u u/s in 5 yrs. Marked prostatic enlargement w/ scattered small sclerotic foci in the thoracic lower lumbar spine, sacrum, left 12th rib, pelvis, and right femur.  Recommend elective outpatient whole-body bone scintigraphy and PSA.   CAD (coronary artery disease)    a. 02/2018 ACS/PCI: LM nl, LAD 85p (3.5x15 Sierra DES), D1 45ost, RI 55ost, RCA nl, EF 25-35%.   Cardiac murmur    Complete left bundle branch block (LBBB) 2016    Dyspnea    HFrEF (heart failure with reduced ejection fraction) ([place])    Hypertension    Ischemic cardiomyopathy 02/2018   a. 02/2018 Echo: EF 35-40% (LV gram on cath was 25-30%), mod, diff HK. mid-apicalanteroseptal, ant, and apical sev HK. RVH.-->  No notable change on echo from June 2020 and August 2020.   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     PAST SURGICAL HISTORY   Past Surgical History:  Procedure Laterality Date   COLONOSCOPY WITH PROPOFOL N/A 02/27/2020   Procedure: COLONOSCOPY WITH PROPOFOL;  Surgeon: Yetta Flock, MD;  Location: [place];  Service: Gastroenterology;  Laterality: N/A;   CORONARY STENT INTERVENTION N/A 03/06/2018   Procedure: CORONARY STENT INTERVENTION;  Surgeon: [name], MD;  Location: [place] CV LAB;  Service: Cardiovascular: Proximal LAD 85% stenosis (and D1): DES PCI with Xience Sierra DES 3.5 mm x 15 mm - 3.9 mm   ESOPHAGOGASTRODUODENOSCOPY (EGD) WITH PROPOFOL N/A 02/27/2020   Procedure: ESOPHAGOGASTRODUODENOSCOPY (EGD) WITH PROPOFOL;  Surgeon: Yetta Flock, MD;  Location: [facility] ENDOSCOPY;  Service: Gastroenterology;  Laterality: N/A;   HEMOSTASIS CLIP PLACEMENT  02/27/2020   Procedure: HEMOSTASIS CLIP PLACEMENT;  Surgeon: Yetta Flock, MD;  Location: [place] ENDOSCOPY;  Service: Gastroenterology;;   HOT HEMOSTASIS N/A 02/27/2020   Procedure: HOT HEMOSTASIS (ARGON PLASMA COAGULATION/BICAP);  Surgeon: Yetta Flock, MD;  Location: [facility] ENDOSCOPY;  Service: Gastroenterology;  Laterality: N/A;   INTRAOPERATIVE TRANSTHORACIC ECHOCARDIOGRAM  03/06/2018   (Peri-MI) mild reduced EF 35-40%.  Diffuse hypokinesis (severe HK of the mid-apical anteroseptal, anterior and apical myocardium consistent with LAD  infarct).  Unable to assess diastolic function.  Paradoxical septal motion likely related to his LBBB.  RV hypertrophy noted.  Trivial pericardial effusion noted.    LEFT HEART CATH AND CORONARY ANGIOGRAPHY N/A 03/06/2018  Procedure: LEFT HEART CATH AND CORONARY ANGIOGRAPHY;  Surgeon: [name], MD;  Location: [place] CV LAB;  Service: Cardiovascular: Proximal LAD 85% involving D1 45%.  Ostial RI 55%.  Severe LV dysfunction EF 25 to 30%.  1+ MR.  Only minimally elevated LVEDP.   ORCHIECTOMY Right 01/17/2020   Procedure: ORCHIECTOMY;  Surgeon: Abbie Sons, MD;  Location: [facility] ORS;  Service: Urology;  Laterality: Right;   POLYPECTOMY  02/27/2020   Procedure: POLYPECTOMY;  Surgeon: Yetta Flock, MD;  Location: [facility] ENDOSCOPY;  Service: Gastroenterology;;   PROSTATE BIOPSY N/A 01/17/2020   Procedure: BIOPSY TRANSRECTAL ULTRASONIC PROSTATE (TUBP);  Surgeon: Abbie Sons, MD;  Location: [facility] ORS;  Service: Urology;  Laterality: N/A;   TRANSTHORACIC ECHOCARDIOGRAM  10/2018   (Most recent echo, to evaluate for TIA/CVA) : Moderate reduced EF 35 to 40%.  Moderate concentric LVH.  Mild dyskinesis of the apex and severe hypokinesis of mid apical anteroseptal and anterior wall.  Unable to assess RV pressures.  Aortic sclerosis with no stenosis.  No significant change seen  +   Cardiac Cath-PCI 03/06/2018: Proximal LAD 85% stenosis involving D1(DES PCI with Xience Sierra 3.5 mm x 15 mm-3.9 mm). Ostial RI 55%. Severe LV dysfunction with EF of 25-35%. 1+ MR. Minimally elevated LVEDP..     Immunization History  Administered Date(s) Administered   PFIZER(Purple Top)SARS-COV-2 Vaccination 06/30/2019, 08/01/2019, 01/30/2020    MEDICATIONS/ALLERGIES   Current Meds  Medication Sig   albuterol (VENTOLIN HFA) 108 (90 Base) MCG/ACT inhaler Inhale 2 puffs into the lungs every 6 (six) hours as needed for wheezing or shortness of breath.   bicalutamide (CASODEX) 50 MG tablet  Take 1 tablet by mouth twice daily (Patient not taking: Reported on 04/18/2020)   clopidogrel (PLAVIX) 75 MG tablet Take 1 tablet (75 mg total) by mouth daily.   ezetimibe (ZETIA) 10 MG tablet Take 1 tablet (10 mg total) by mouth daily.   furosemide (LASIX) 20 MG tablet May take 20 mg tablet as needed for increase shortness of breath or swelling   metoprolol succinate (TOPROL XL) 25 MG 24 hr tablet Take 1 tablet (25 mg total) by mouth daily.   ondansetron (ZOFRAN) 4 MG tablet Take 4 mg by mouth every 4 (four) hours as needed for nausea or vomiting.    rosuvastatin (CRESTOR) 40 MG tablet Take 1 tablet by mouth once daily   sacubitril-valsartan (ENTRESTO) 49-51 MG Take 1 tablet by mouth 2 (two) times daily.   spironolactone (ALDACTONE) 25 MG tablet Take 1 tablet (25 mg total) by mouth daily.   tamsulosin (FLOMAX) 0.4 MG CAPS capsule Take 1 capsule (0.4 mg total) by mouth daily after supper.   traZODone (DESYREL) 50 MG tablet Take 0.5-1 tablets (25-50 mg total) by mouth at bedtime as needed for sleep.   venlafaxine XR (EFFEXOR XR) 37.5 MG 24 hr capsule Take 1 capsule (37.5 mg total) by mouth daily with breakfast.   [DISCONTINUED] metoprolol succinate (TOPROL-XL) 25 MG 24 hr tablet Take 12.5 mg by mouth daily. Take 12.5 mg by mouth   [DISCONTINUED] nitroGLYCERIN (NITROSTAT) 0.4 MG SL tablet Place 1 tablet (0.4 mg total) under the tongue every 5 (five) minutes as needed for chest pain.   [DISCONTINUED] sacubitril-valsartan (ENTRESTO) 24-26 MG Take 1/2 tablet in the morning and 1 tablet in the evening   [DISCONTINUED] spironolactone (ALDACTONE) 25 MG tablet Take 0.5 tablets (12.5 mg total) by mouth daily.    No Known Allergies  SOCIAL HISTORY/FAMILY HISTORY  Reviewed in Epic:  Pertinent findings: ~ 1-2 CIG Social History   Tobacco Use   Smoking status: Current Some Day Smoker    Packs/day: 0.00    Years: 0.00    Pack years: 0.00    Types: Cigarettes   Smokeless tobacco:  Never Used   Tobacco comment: quit three days ago  Vaping Use   Vaping Use: Never used  Substance Use Topics   Alcohol use: Yes    Alcohol/week: 2.0 standard drinks    Types: 2 Cans of beer per week    Comment: 2 Beers daily.   Drug use: Yes    Frequency: 3.0 times per week    Types: Marijuana   Social History   Social History Narrative   Currently not working.  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  Now has cancer.    OBJCTIVE -PE, EKG, labs   Wt Readings from Last 3 Encounters:  04/18/20 162 lb (73.5 kg)  04/17/20 160 lb (72.6 kg)  03/22/20 159 lb 6.4 oz (72.3 kg)    Physical Exam: BP (!) 164/88    Pulse 81    Ht 6' (1.829 m)    Wt 160 lb (72.6 kg)    BMI 21.70 kg/m  Physical Exam Constitutional:      Appearance: Normal appearance. He is not ill-appearing (Just appears somewhat thin and frail.) or toxic-appearing.  HENT:     Head: Normocephalic and atraumatic.  Neck:     Vascular: No carotid bruit, hepatojugular reflux or JVD.     Comments: Right-sided neck mass.  Stable Cardiovascular:     Rate and Rhythm: Normal rate and regular rhythm.  No extrasystoles are present.    Pulses: Normal pulses.     Heart sounds: S1 normal. Murmur (Soft HSM at apex.) heard.  Gallop present. S4 sounds present.      Comments: S2 split Pulmonary:     Effort: Pulmonary effort is normal. No respiratory distress.     Breath sounds: Normal breath sounds.  Chest:     Chest wall: No tenderness.  Musculoskeletal:        General: No swelling. Normal range of motion.     Cervical back: Normal range of motion and neck supple.  Skin:    General: Skin is warm and dry.  Neurological:     General: No focal deficit present.     Mental Status: He is alert and oriented to person, place, and time.     Motor: No weakness.     Gait: Gait normal.  Psychiatric:        Mood and Affect: Mood normal.        Behavior: Behavior normal.        Thought Content: Thought  content normal.        Judgment: Judgment normal.     Comments: Actually seems to be in pretty good spirits today.     Adult ECG Report  Rate: 81;  Rhythm: normal sinus rhythm and Left atrial abnormality.  Rightward axis, nonspecific IVCD and LBB pattern.  There is T wave inversions likely repolarization abnormality.;   Narrative Interpretation: Stable  Recent Labs: Reviewed Lab Results  Component Value Date   CHOL 180 02/23/2020   HDL 54 02/23/2020   LDLCALC 101 (H) 02/23/2020   TRIG 123 02/23/2020   CHOLHDL 3.3 02/23/2020   Lab Results  Component Value Date   CREATININE 0.77 02/28/2020   BUN  7 02/28/2020   NA 138 02/28/2020   K 3.4 (L) 02/28/2020   CL 99 02/28/2020   CO2 27 02/28/2020   CBC Latest Ref Rng & Units 02/28/2020 02/27/2020 02/25/2020  WBC 4.0 - 10.5 K/uL 9.6 6.5 7.5  Hemoglobin 13.0 - 17.0 g/dL 10.0(L) 9.8(L) 9.6(L)  Hematocrit 39.0 - 52.0 % 30.9(L) 30.5(L) 29.7(L)  Platelets 150 - 400 K/uL 351 325 290    Lab Results  Component Value Date   TSH 1.654 02/23/2020    ==================================================  COVID-19 Education: The signs and symptoms of COVID-19 were discussed with the patient and how to seek care for testing (follow up with PCP or arrange E-visit).   The importance of social distancing and COVID-19 vaccination was discussed today. The patient is practicing social distancing & Masking.   I spent a total of 68minutes with the patient spent in direct patient consultation.  Additional time spent with chart review  / charting (studies, outside notes, etc): 12 min Total Time: 47 min   Current medicines are reviewed at length with the patient today.  (+/- concerns) n/a  This visit occurred during the SARS-CoV-2 public health emergency.  Safety protocols were in place, including screening questions prior to the visit, additional usage of staff PPE, and extensive cleaning of exam room while observing appropriate contact time as  indicated for disinfecting solutions.  Notice: This dictation was prepared with Dragon dictation along with smaller phrase technology. Any transcriptional errors that result from this process are unintentional and may not be corrected upon review.  Patient Instructions / Medication Changes & Studies & Tests Ordered   Patient Instructions  Medication Instructions:   INCREASE ENTRESTO 49/51 ONE TABLET TWICE A DAY   INCREASE TOPROL XL ( METOPROLOL SUCCINATE ) 25 MG - ONE TABLET ONCE A DAY   INCREASE SPIRONOLACTONE  25 MG ONE TABLET DAILY    START TAKING 10 MG ZETIA ONE TABLET DAILY   *If you need a refill on your cardiac medications before your next appointment, please call your pharmacy*   Lab Work:  IN ONE MONTH _ MARCH 2022 CMP LIPID CBC If you have labs (blood work) drawn today and your tests are completely normal, you will receive your results only by:  MyChart Message (if you have MyChart) OR  A paper copy in the mail If you have any lab test that is abnormal or we need to change your treatment, we will call you to review the results.   Testing/Procedures: NOT NEEDED   Follow-Up: At [facility], you and your health needs are our priority.  As part of our continuing mission to provide you with exceptional heart care, we have created designated Provider Care Teams.  These Care Teams include your primary Cardiologist (physician) and Advanced Practice Providers (APPs -  Physician Assistants and Nurse Practitioners) who all work together to provide you with the care you need, when you need it.  We recommend signing up for the patient portal called "MyChart".  Sign up information is provided on this After Visit Summary.  MyChart is used to connect with patients for Virtual Visits (Telemedicine).  Patients are able to view lab/test results, encounter notes, upcoming appointments, etc.  Non-urgent messages can be sent to your provider as well.   To learn more about what you can  do with MyChart, go to [email or web address].    Your next appointment:   3 month(s)  The format for your next appointment:   In Person  Provider:  Glenetta Hew, MD   Other Instructions You have been referred to  DR CAMNITZ OR DR lAMBERT -- DISCUSS LEFT BBB , ISCHEMIC CARDIOMYOPATHY      Studies Ordered:   Orders Placed This Encounter  Procedures   CBC   Lipid panel   Comprehensive metabolic panel   Ambulatory referral to Cardiac Electrophysiology   EKG 12-Lead     Glenetta Hew, M.D., M.S. Interventional Cardiologist   Pager # [phone] Phone # [phone] [address] [place], [place] [address]   Thank you for choosing Heartcare at [facility]!!

## 2020-04-17 NOTE — Patient Instructions (Signed)
Medication Instructions:   INCREASE ENTRESTO 49/51 ONE TABLET TWICE A DAY   INCREASE TOPROL XL ( METOPROLOL SUCCINATE ) 25 MG - ONE TABLET ONCE A DAY   INCREASE SPIRONOLACTONE  25 MG ONE TABLET DAILY    START TAKING 10 MG ZETIA ONE TABLET DAILY   *If you need a refill on your cardiac medications before your next appointment, please call your pharmacy*   Lab Work:  IN ONE MONTH _ MARCH 2022 CMP LIPID CBC If you have labs (blood work) drawn today and your tests are completely normal, you will receive your results only by: Marland Kitchen MyChart Message (if you have MyChart) OR . A paper copy in the mail If you have any lab test that is abnormal or we need to change your treatment, we will call you to review the results.   Testing/Procedures: NOT NEEDED   Follow-Up: At [facility], you and your health needs are our priority.  As part of our continuing mission to provide you with exceptional heart care, we have created designated Provider Care Teams.  These Care Teams include your primary Cardiologist (physician) and Advanced Practice Providers (APPs -  Physician Assistants and Nurse Practitioners) who all work together to provide you with the care you need, when you need it.  We recommend signing up for the patient portal called "MyChart".  Sign up information is provided on this After Visit Summary.  MyChart is used to connect with patients for Virtual Visits (Telemedicine).  Patients are able to view lab/test results, encounter notes, upcoming appointments, etc.  Non-urgent messages can be sent to your provider as well.   To learn more about what you can do with MyChart, go to [email or web address].    Your next appointment:   3 month(s)  The format for your next appointment:   In Person  Provider:   Glenetta Hew, MD   Other Instructions You have been referred to  DR CAMNITZ OR DR lAMBERT -- DISCUSS LEFT BBB , ISCHEMIC CARDIOMYOPATHY

## 2020-04-17 NOTE — Telephone Encounter (Signed)
patient scheduled for Final radiation treatment on 2/28. Please schedule lab/MD approx 3 weeks after final tx with labs (cbc,cmp,psa) 1-2 days prior to MD and notify pt of appts.

## 2020-04-17 NOTE — Telephone Encounter (Signed)
Left message for patient to call and schedule his 3 month follow up with Dr. Ellyn Hack

## 2020-04-18 ENCOUNTER — Ambulatory Visit
Admission: RE | Admit: 2020-04-18 | Discharge: 2020-04-18 | Disposition: A | Discharge status: Home / Self Care | Payer: Medicaid Other | Source: Ambulatory Visit | Attending: Radiation Oncology | Admitting: Radiation Oncology

## 2020-04-18 ENCOUNTER — Other Ambulatory Visit: Payer: Self-pay

## 2020-04-18 ENCOUNTER — Ambulatory Visit
Admission: RE | Admit: 2020-04-18 | Discharge: 2020-04-18 | Disposition: A | Discharge status: Home / Self Care | Payer: Medicaid Other | Source: Ambulatory Visit | Attending: Otolaryngology | Admitting: Otolaryngology

## 2020-04-18 DIAGNOSIS — D110 Benign neoplasm of parotid gland: Secondary | ICD-10-CM | POA: Insufficient documentation

## 2020-04-18 DIAGNOSIS — C61 Malignant neoplasm of prostate: Secondary | ICD-10-CM | POA: Diagnosis not present

## 2020-04-18 DIAGNOSIS — K118 Other diseases of salivary glands: Secondary | ICD-10-CM | POA: Diagnosis present

## 2020-04-18 DIAGNOSIS — Z8547 Personal history of malignant neoplasm of testis: Secondary | ICD-10-CM | POA: Diagnosis not present

## 2020-04-18 NOTE — Procedures (Signed)
Interventional Radiology Procedure Note  Procedure: [place] RT PAROTID MASS BX    Complications: None  Estimated Blood Loss:  MIN  Findings: SEVERAL 18 G CORES, SAMPLES STRANDY AND FRAGMENTED PATH PENDING FULL REPORT IN PACS    Tamera Punt, MD

## 2020-04-18 NOTE — Telephone Encounter (Signed)
Done

## 2020-04-18 NOTE — Telephone Encounter (Signed)
Spoke with patient regarding 3 mos follow up with Dr. Ellyn Hack ----scheduled Thursday 07/19/20 at 11:00 am---arrival time is 10:45 am---patient voiced his understanding and I will mail information to him

## 2020-04-18 NOTE — Discharge Instructions (Signed)
Needle Biopsy, Care After This sheet gives you information about how to care for yourself after your procedure. Your health care provider may also give you more specific instructions. If you have problems or questions, contact your health care provider. What can I expect after the procedure? After the procedure, it is common to have soreness, bruising, or mild pain at the puncture site. This should go away in a few days. Follow these instructions at home: Needle insertion site care  Wash your hands with soap and water before you change your bandage (dressing). If you cannot use soap and water, use hand sanitizer.  Follow instructions from your health care provider about how to take care of your puncture site. This includes: ? When and how to change your dressing. ? When to remove your dressing.  Check your puncture site every day for signs of infection. Check for: ? Redness, swelling, or pain. ? Fluid or blood. ? Pus or a bad smell. ? Warmth.   General instructions  Return to your normal activities as told by your health care provider. Ask your health care provider what activities are safe for you.  Do not take baths, swim, or use a hot tub until your health care provider approves. Ask your health care provider if you may take showers. You may only be allowed to take sponge baths.  Take over-the-counter and prescription medicines only as told by your health care provider.  Keep all follow-up visits as told by your health care provider. This is important. Contact a health care provider if:  You have a fever.  You have redness, swelling, or pain at the puncture site that lasts longer than a few days.  You have fluid, blood, or pus coming from your puncture site.  Your puncture site feels warm to the touch. Get help right away if:  You have severe bleeding from the puncture site. Summary  After the procedure, it is common to have soreness, bruising, or mild pain at the puncture  site. This should go away in a few days.  Check your puncture site every day for signs of infection, such as redness, swelling, or pain.  Get help right away if you have severe bleeding from your puncture site. This information is not intended to replace advice given to you by your health care provider. Make sure you discuss any questions you have with your health care provider. Document Revised: 08/25/2019 Document Reviewed: 08/25/2019 Elsevier Patient Education  2021 Elsevier Inc.  

## 2020-04-19 ENCOUNTER — Ambulatory Visit
Admission: RE | Admit: 2020-04-19 | Discharge: 2020-04-19 | Disposition: A | Discharge status: Home / Self Care | Payer: Medicaid Other | Source: Ambulatory Visit | Attending: Radiation Oncology | Admitting: Radiation Oncology

## 2020-04-19 ENCOUNTER — Encounter: Payer: Self-pay | Admitting: Cardiology

## 2020-04-19 DIAGNOSIS — C61 Malignant neoplasm of prostate: Secondary | ICD-10-CM | POA: Diagnosis not present

## 2020-04-19 LAB — SURGICAL PATHOLOGY

## 2020-04-19 NOTE — Assessment & Plan Note (Addendum)
Two-vessel disease with PCI to the LAD and PTCA of the diagonal branch.  Unfortunately no real improvement in his EF from initial PCI-likely related to sizable infarct.  I still think his chest comfort that he is having intermittently is musculoskeletal.  Happens mostly at rest.  Myoview in September for similar symptoms did not show any evidence of ischemia.  Could be microvascular in nature.  Plan:   With ongoing symptoms of chest pain we will continue clopidogrel but okay to hold 5 days preop for surgery procedures.  He is on Toprol low-dose, I have been reluctant to titrate up because of fatigue and previous hypotension. => With him having ongoing chest discomfort and now his blood pressure being higher, I think we can titrate up to 25 mg Toprol.  Continue current dose of rosuvastatin.  Titrate up Entresto to 49 and 51 mg twice daily.

## 2020-04-19 NOTE — Assessment & Plan Note (Signed)
He has had a history of dizziness and fatigue.  I think some of his positional dizziness is probably related to dehydration.  We talked about the importance of adequate hydration, especially in light of his radiation therapy.  Recommended continued intake of at least 80 ounces of water a day.

## 2020-04-19 NOTE — Assessment & Plan Note (Signed)
Labs in December showed LDL of 101.  Not at goal, but I am reluctant to go very aggressive right now with his ongoing cancer therapy.  Plan: Continue simvastatin 40 mg and add Zetia 10 mg daily. => Recheck labs in March.  If still elevated, will refer to CV[facility] lipid clinic.

## 2020-04-19 NOTE — Assessment & Plan Note (Signed)
He is now couple years out from his MI.  Unfortunately, was delayed presentation and had a pretty significant infarct.  Has some exertional fatigue and dyspnea as well as occasional chest pain.  Continue to titrate medications.

## 2020-04-19 NOTE — Assessment & Plan Note (Signed)
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   Would like to continue Plavix/clopidogrel long-term-but okay to hold for procedures 5 days preop

## 2020-04-19 NOTE — Assessment & Plan Note (Signed)
He was supposed uptitrated his Toprol dose 25 mg last visit.  We will increase today.

## 2020-04-19 NOTE — Assessment & Plan Note (Signed)
Ongoing LBBB.  Probably since his MI. -> Follow-up echocardiogram showed EF still 30 to 35% range probably close to 35%.  Referring to EP to consider CRT-D

## 2020-04-19 NOTE — Assessment & Plan Note (Signed)
No PND, orthopnea or edema.  Just exertional dyspnea.  NYHA class II symptoms mostly because of dyspnea.  I suspect some of it could be related to his cancer as well with fatigue. With better blood pressure we are able to titrate medications up.  Plan:   Gentle titration-spironolactone at 25 mg, Toprol up to 25 mg  Increase Entresto to 49/51 mg twice daily.  Has not required furosemide, is euvolemic on exam.  He has PRN written but is not using.

## 2020-04-19 NOTE — Assessment & Plan Note (Signed)
Persistently reduced EF with most recent echo showing EF of 35% and in the setting of left bundle branch block.  We are now titrating up his medications including Entresto, Toprol and spironolactone.  Not requiring diuretic.  Had reluctant to pursue discussion about CRT (either CRT-D or CRT-P) because of his stroke and then cancer.  Now that he is coming to the end of his cancer treatment I will refer to electrophysiology for evaluation for possible CRT-with EF less than 35%, would consider CRT-D especially in light of anterior infarct. Could also potentially CRT-P would regain some of the septal wall motion loss and LBBB.

## 2020-04-19 NOTE — Assessment & Plan Note (Signed)
Pressures are finally elevated.  Was able to tolerate the addition of Entresto last visit. Plan: Titrate Entresto to 49/51 mg twice daily, Toprol 25 mg daily and spironolactone 25 mg daily.

## 2020-04-20 ENCOUNTER — Ambulatory Visit
Admission: RE | Admit: 2020-04-20 | Discharge: 2020-04-20 | Disposition: A | Discharge status: Home / Self Care | Payer: Medicaid Other | Source: Ambulatory Visit | Attending: Radiation Oncology | Admitting: Radiation Oncology

## 2020-04-20 DIAGNOSIS — C61 Malignant neoplasm of prostate: Secondary | ICD-10-CM | POA: Diagnosis not present

## 2020-04-23 ENCOUNTER — Ambulatory Visit: Payer: Medicaid Other

## 2020-04-24 ENCOUNTER — Ambulatory Visit: Payer: Medicaid Other

## 2020-04-25 ENCOUNTER — Ambulatory Visit: Admission: RE | Admit: 2020-04-25 | Payer: Medicaid Other | Source: Ambulatory Visit

## 2020-04-25 ENCOUNTER — Ambulatory Visit: Payer: Medicaid Other

## 2020-04-26 ENCOUNTER — Ambulatory Visit: Admission: RE | Admit: 2020-04-26 | Payer: Medicaid Other | Source: Ambulatory Visit

## 2020-04-26 ENCOUNTER — Ambulatory Visit
Admission: RE | Admit: 2020-04-26 | Discharge: 2020-04-26 | Disposition: A | Discharge status: Home / Self Care | Payer: Medicaid Other | Source: Ambulatory Visit | Attending: Radiation Oncology | Admitting: Radiation Oncology

## 2020-04-26 DIAGNOSIS — C61 Malignant neoplasm of prostate: Secondary | ICD-10-CM | POA: Diagnosis not present

## 2020-04-27 ENCOUNTER — Ambulatory Visit: Payer: Medicaid Other

## 2020-04-27 ENCOUNTER — Inpatient Hospital Stay: Payer: Medicaid Other | Attending: Radiation Oncology

## 2020-04-30 ENCOUNTER — Ambulatory Visit
Admission: RE | Admit: 2020-04-30 | Discharge: 2020-04-30 | Disposition: A | Discharge status: Home / Self Care | Payer: Medicaid Other | Source: Ambulatory Visit | Attending: Radiation Oncology | Admitting: Radiation Oncology

## 2020-04-30 DIAGNOSIS — C61 Malignant neoplasm of prostate: Secondary | ICD-10-CM | POA: Diagnosis not present

## 2020-05-01 ENCOUNTER — Ambulatory Visit
Admission: RE | Admit: 2020-05-01 | Discharge: 2020-05-01 | Disposition: A | Discharge status: Home / Self Care | Payer: Medicaid Other | Source: Ambulatory Visit | Attending: Radiation Oncology | Admitting: Radiation Oncology

## 2020-05-01 DIAGNOSIS — C61 Malignant neoplasm of prostate: Secondary | ICD-10-CM | POA: Diagnosis not present

## 2020-05-02 ENCOUNTER — Ambulatory Visit
Admission: RE | Admit: 2020-05-02 | Discharge: 2020-05-02 | Disposition: A | Discharge status: Home / Self Care | Payer: Medicaid Other | Source: Ambulatory Visit | Attending: Radiation Oncology | Admitting: Radiation Oncology

## 2020-05-02 ENCOUNTER — Ambulatory Visit: Payer: Medicaid Other

## 2020-05-02 DIAGNOSIS — C61 Malignant neoplasm of prostate: Secondary | ICD-10-CM | POA: Diagnosis not present

## 2020-05-03 ENCOUNTER — Ambulatory Visit: Payer: Medicaid Other

## 2020-05-03 ENCOUNTER — Ambulatory Visit
Admission: RE | Admit: 2020-05-03 | Discharge: 2020-05-03 | Disposition: A | Discharge status: Home / Self Care | Payer: Medicaid Other | Source: Ambulatory Visit | Attending: Radiation Oncology | Admitting: Radiation Oncology

## 2020-05-03 DIAGNOSIS — C61 Malignant neoplasm of prostate: Secondary | ICD-10-CM | POA: Diagnosis not present

## 2020-05-04 ENCOUNTER — Ambulatory Visit
Admission: RE | Admit: 2020-05-04 | Discharge: 2020-05-04 | Disposition: A | Discharge status: Home / Self Care | Payer: Medicaid Other | Source: Ambulatory Visit | Attending: Radiation Oncology | Admitting: Radiation Oncology

## 2020-05-04 DIAGNOSIS — C61 Malignant neoplasm of prostate: Secondary | ICD-10-CM | POA: Diagnosis not present

## 2020-05-07 ENCOUNTER — Ambulatory Visit: Payer: Medicaid Other

## 2020-05-07 ENCOUNTER — Ambulatory Visit
Admission: RE | Admit: 2020-05-07 | Discharge: 2020-05-07 | Disposition: A | Discharge status: Home / Self Care | Payer: Medicaid Other | Source: Ambulatory Visit | Attending: Radiation Oncology | Admitting: Radiation Oncology

## 2020-05-07 DIAGNOSIS — C61 Malignant neoplasm of prostate: Secondary | ICD-10-CM | POA: Diagnosis not present

## 2020-05-08 ENCOUNTER — Ambulatory Visit
Admission: RE | Admit: 2020-05-08 | Discharge: 2020-05-08 | Disposition: A | Discharge status: Home / Self Care | Payer: Medicaid Other | Source: Ambulatory Visit | Attending: Radiation Oncology | Admitting: Radiation Oncology

## 2020-05-08 ENCOUNTER — Ambulatory Visit: Payer: Medicaid Other

## 2020-05-08 DIAGNOSIS — C61 Malignant neoplasm of prostate: Secondary | ICD-10-CM | POA: Insufficient documentation

## 2020-05-09 ENCOUNTER — Ambulatory Visit: Payer: Medicaid Other

## 2020-05-09 ENCOUNTER — Ambulatory Visit
Admission: RE | Admit: 2020-05-09 | Discharge: 2020-05-09 | Disposition: A | Discharge status: Home / Self Care | Payer: Medicaid Other | Source: Ambulatory Visit | Attending: Radiation Oncology | Admitting: Radiation Oncology

## 2020-05-09 DIAGNOSIS — C61 Malignant neoplasm of prostate: Secondary | ICD-10-CM | POA: Diagnosis not present

## 2020-05-10 ENCOUNTER — Encounter: Payer: Self-pay | Admitting: Cardiology

## 2020-05-10 ENCOUNTER — Ambulatory Visit (INDEPENDENT_AMBULATORY_CARE_PROVIDER_SITE_OTHER): Payer: Medicaid Other | Admitting: Cardiology

## 2020-05-10 ENCOUNTER — Ambulatory Visit
Admission: RE | Admit: 2020-05-10 | Discharge: 2020-05-10 | Disposition: A | Discharge status: Home / Self Care | Payer: Medicaid Other | Source: Ambulatory Visit | Attending: Radiation Oncology | Admitting: Radiation Oncology

## 2020-05-10 ENCOUNTER — Other Ambulatory Visit: Payer: Self-pay

## 2020-05-10 ENCOUNTER — Ambulatory Visit: Payer: Self-pay | Admitting: Urology

## 2020-05-10 ENCOUNTER — Ambulatory Visit: Payer: Medicaid Other

## 2020-05-10 VITALS — BP 142/64 | HR 100 | Ht 72.0 in | Wt 154.0 lb

## 2020-05-10 DIAGNOSIS — C61 Malignant neoplasm of prostate: Secondary | ICD-10-CM | POA: Diagnosis not present

## 2020-05-10 DIAGNOSIS — I255 Ischemic cardiomyopathy: Secondary | ICD-10-CM | POA: Diagnosis not present

## 2020-05-10 DIAGNOSIS — Z01812 Encounter for preprocedural laboratory examination: Secondary | ICD-10-CM

## 2020-05-10 LAB — BASIC METABOLIC PANEL
BUN/Creatinine Ratio: 7 — ABNORMAL LOW (ref 10–24)
BUN: 5 mg/dL — ABNORMAL LOW (ref 8–27)
CO2: 23 mmol/L (ref 20–29)
Calcium: 10.2 mg/dL (ref 8.6–10.2)
Chloride: 101 mmol/L (ref 96–106)
Creatinine, Ser: 0.73 mg/dL — ABNORMAL LOW (ref 0.76–1.27)
Glucose: 102 mg/dL — ABNORMAL HIGH (ref 65–99)
Potassium: 4.1 mmol/L (ref 3.5–5.2)
Sodium: 136 mmol/L (ref 134–144)
eGFR: 104 mL/min/{1.73_m2} (ref >59)

## 2020-05-10 LAB — CBC
Hematocrit: 32.5 % — ABNORMAL LOW (ref 37.5–51.0)
Hemoglobin: 10.9 g/dL — ABNORMAL LOW (ref 13.0–17.7)
MCH: 24.8 pg — ABNORMAL LOW (ref 26.6–33.0)
MCHC: 33.5 g/dL (ref 31.5–35.7)
MCV: 74 fL — ABNORMAL LOW (ref 79–97)
Platelets: 306 10*3/uL (ref 150–450)
RBC: 4.39 x10E6/uL (ref 4.14–5.80)
RDW: 19 % — ABNORMAL HIGH (ref 11.6–15.4)
WBC: 3.3 10*3/uL — ABNORMAL LOW (ref 3.4–10.8)

## 2020-05-10 NOTE — Progress Notes (Signed)
Electrophysiology Office Note   Date:  05/10/2020   ID:  Daniel Weiss, DOB January 14, 1961, MRN [id number]  PCP:  Azzie Glatter, FNP  Cardiologist:  Ellyn Hack Primary Electrophysiologist:  Tyvon Eggenberger Meredith Leeds, MD    Chief Complaint: CHF   History of Present Illness: Daniel Weiss is a 60 y.o. male who is being seen today for the evaluation of CHF at the request of [name], MD. Presenting today for electrophysiology evaluation.  He has a history significant for anterior STEMI with PCI to the LAD in 2019, ischemic cardiomyopathy with chronic combined systolic and diastolic heart failure, left bundle branch block, CVA.  He currently has prostate cancer and is now undergoing XRT.  He is not on oral medications for this any longer.  He gets dyspnea on exertion with mild activity such as making the bed and light walks.  He also gets intermittent episodes of chest pain requiring sublingual nitroglycerin.  Today, he denies symptoms of palpitations, chest pain, orthopnea, PND, lower extremity edema, claudication, dizziness, presyncope, syncope, bleeding, or neurologic sequela. The patient is tolerating medications without difficulties.  His shortness of breath has remained relatively stable.  He currently has no chest pain.   Past Medical History:  Diagnosis Date  . Abnormal chest CT 02/2018   a. Ectatic 3 cm Ao arch - rec f/u outpt imaging w/ CTA or MRA. Ectatic atheromatous abd Ao @ risk for aneurysm - rec f/u u/s in 5 yrs. Marked prostatic enlargement w/ scattered small sclerotic foci in the thoracic lower lumbar spine, sacrum, left 12th rib, pelvis, and right femur.  Recommend elective outpatient whole-body bone scintigraphy and PSA.  Marland Kitchen Abnormal CT scan 03/10/2018  . Acute blood loss anemia   . Acute cardioembolic stroke ([place])   . Acute cerebrovascular accident (CVA) ([place]) 11/03/2018  . 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 ARF (acute renal failure) ([place]) 06/29/2015  . Benign neoplasm  of colon   . CAD (coronary artery disease)    a. 02/2018 ACS/PCI: LM nl, LAD 85p (3.5x15 Sierra DES), D1 45ost, RI 55ost, RCA nl, EF 25-35%.  . Cardiac murmur   . Chest pain on exertion 11/28/2019  . Chronic combined systolic and diastolic CHF, NYHA class 2 ([place]) 04/17/2020  . Complete left bundle branch block (LBBB) 2016  . Coronary artery disease involving native coronary artery of native heart with angina pectoris ([place]) 03/10/2018  . Dyspnea   . Encephalopathy, hypertensive   . Essential hypertension 07/07/2014  . 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 Goals of care, counseling/discussion 12/22/2019  . Hematemesis without nausea   . HFrEF (heart failure with reduced ejection fraction) ([place])   . History of CVA (cerebrovascular accident) 11/23/2018  . History of non-ST elevation myocardial infarction (NSTEMI) 02/21/2019  . Hypertension   . Hypokalemia   . Hyponatremia 06/29/2015  . Ischemic cardiomyopathy 02/2018   a. 02/2018 Echo: EF 35-40% (LV gram on cath was 25-30%), mod, diff HK. mid-apicalanteroseptal, ant, and apical sev HK. RVH.-->  No notable change on echo from June 2020 and August 2020.  Marland Kitchen Localized swelling, mass or lump of neck   . Long term (current) use of anticoagulants 11/16/2018  . Malnutrition of moderate degree 11/11/2018  . Mass of right side of neck 07/07/2014  . Myocardial infarction ([place])   . Neck mass    Right Neck mass - US neck showed 6.3 cm complex, partially cystic, partially solid mass. CT neck showed 2 cm mass from parotid gland on  right, with 3x4x5cm Possible metastatic lymphadenopathy. we recommended following up with Dr. Redmond Baseman ENT for biopsy of this.     . Non-ST elevation (NSTEMI) myocardial infarction ([place]) 03/06/2018  . NSTEMI (non-ST elevated myocardial infarction) ([place]) 02/2018   85% pLAD - DES PCI   . Paresthesia 07/07/2014  . Prostate cancer ([place]) 03/10/2018  . Prostate cancer metastatic to multiple sites [facility]) 11/2018  . Right sided weakness 07/07/2014  .  Stroke ([place])   . TIA (transient ischemic attack) 2016  . Warthin's tumor    Past Surgical History:  Procedure Laterality Date  . COLONOSCOPY WITH PROPOFOL N/A 02/27/2020   Procedure: COLONOSCOPY WITH PROPOFOL;  Surgeon: Yetta Flock, MD;  Location: [place];  Service: Gastroenterology;  Laterality: N/A;  . CORONARY STENT INTERVENTION N/A 03/06/2018   Procedure: CORONARY STENT INTERVENTION;  Surgeon: [name], MD;  Location: [place] CV [facility];  Service: Cardiovascular: Proximal LAD 85% stenosis (and D1): DES PCI with Xience Sierra DES 3.5 mm x 15 mm - 3.9 mm  . ESOPHAGOGASTRODUODENOSCOPY (EGD) WITH PROPOFOL N/A 02/27/2020   Procedure: ESOPHAGOGASTRODUODENOSCOPY (EGD) WITH PROPOFOL;  Surgeon: Yetta Flock, MD;  Location[facility] ENDOSCOPY;  Service: Gastroenterology;  Laterality: N/A;  . HEMOSTASIS CLIP PLACEMENT  02/27/2020   Procedure: HEMOSTASIS CLIP PLACEMENT;  Surgeon: Yetta Flock, MD;  Location: [place] ENDOSCOPY;  Service: Gastroenterology;;  . HOT HEMOSTASIS N/A 02/27/2020   Procedure: HOT HEMOSTASIS (ARGON PLASMA COAGULATION/BICAP);  Surgeon: Yetta Flock, MD;  Location: [facility] ENDOSCOPY;  Service: Gastroenterology;  Laterality: N/A;  . INTRAOPERATIVE TRANSTHORACIC ECHOCARDIOGRAM  03/06/2018   (Peri-MI) mild reduced EF 35-40%.  Diffuse hypokinesis (severe HK of the mid-apical anteroseptal, anterior and apical myocardium consistent with LAD infarct).  Unable to assess diastolic function.  Paradoxical septal motion likely related to his LBBB.  RV hypertrophy noted.  Trivial pericardial effusion noted.   Marland Kitchen LEFT HEART CATH AND CORONARY ANGIOGRAPHY N/A 03/06/2018   Procedure: LEFT HEART CATH AND CORONARY ANGIOGRAPHY;  Surgeon: [name], MD;  Location: [place] [facility];  Service: Cardiovascular: Proximal LAD 85% involving D1 45%.  Ostial RI 55%.  Severe LV dysfunction EF 25 to 30%.  1+ MR.  Only minimally elevated LVEDP.  Marland Kitchen ORCHIECTOMY Right 01/17/2020    Procedure: ORCHIECTOMY;  Surgeon: Abbie Sons, MD;  Location: [facility] ORS;  Service: Urology;  Laterality: Right;  . POLYPECTOMY  02/27/2020   Procedure: POLYPECTOMY;  Surgeon: Yetta Flock, MD;  Location[facility] ENDOSCOPY;  Service: Gastroenterology;;  . PROSTATE BIOPSY N/A 01/17/2020   Procedure: BIOPSY TRANSRECTAL ULTRASONIC PROSTATE (TUBP);  Surgeon: Abbie Sons, MD;  Location: [facility] ORS;  Service: Urology;  Laterality: N/A;  . TRANSTHORACIC ECHOCARDIOGRAM  10/2018   (Most recent echo, to evaluate for TIA/CVA) : Moderate reduced EF 35 to 40%.  Moderate concentric LVH.  Mild dyskinesis of the apex and severe hypokinesis of mid apical anteroseptal and anterior wall.  Unable to assess RV pressures.  Aortic sclerosis with no stenosis.  No significant change seen     Current Outpatient Medications  Medication Sig Dispense Refill  . albuterol (VENTOLIN HFA) 108 (90 Base) MCG/ACT inhaler Inhale 2 puffs into the lungs every 6 (six) hours as needed for wheezing or shortness of breath. 8 g 11  . bicalutamide (CASODEX) 50 MG tablet Take 1 tablet by mouth twice daily 60 tablet 0  . clopidogrel (PLAVIX) 75 MG tablet Take 1 tablet (75 mg total) by mouth daily.    Marland Kitchen ezetimibe (ZETIA) 10  MG tablet Take 1 tablet (10 mg total) by mouth daily. 90 tablet 3  . furosemide (LASIX) 20 MG tablet May take 20 mg tablet as needed for increase shortness of breath or swelling 20 tablet 6  . metoprolol succinate (TOPROL XL) 25 MG 24 hr tablet Take 1 tablet (25 mg total) by mouth daily. 90 tablet 3  . nitroGLYCERIN (NITROSTAT) 0.4 MG SL tablet Place 1 tablet (0.4 mg total) under the tongue every 5 (five) minutes as needed for chest pain. 25 tablet 3  . ondansetron (ZOFRAN) 4 MG tablet Take 4 mg by mouth every 4 (four) hours as needed for nausea or vomiting.     . rosuvastatin (CRESTOR) 40 MG tablet Take 1 tablet by mouth once daily 30 tablet 1  . sacubitril-valsartan (ENTRESTO) 49-51 MG Take 1 tablet by mouth 2  (two) times daily. 60 tablet 6  . spironolactone (ALDACTONE) 25 MG tablet Take 1 tablet (25 mg total) by mouth daily. 90 tablet 3  . tamsulosin (FLOMAX) 0.4 MG CAPS capsule Take 1 capsule (0.4 mg total) by mouth daily after supper. 90 capsule 3  . traZODone (DESYREL) 50 MG tablet Take 0.5-1 tablets (25-50 mg total) by mouth at bedtime as needed for sleep. 30 tablet 6  . venlafaxine XR (EFFEXOR XR) 37.5 MG 24 hr capsule Take 1 capsule (37.5 mg total) by mouth daily with breakfast. 30 capsule 1   No current facility-administered medications for this visit.    Allergies:   Patient has no known allergies.   Social History:  The patient  reports that he has been smoking cigarettes. He has been smoking about 0.00 packs per day for the past 0.00 years. He has never used smokeless tobacco. He reports current alcohol use of about 2.0 standard drinks of alcohol per week. He reports current drug use. Frequency: 3.00 times per week. Drug: Marijuana.   Family History:  The patient's family history includes Breast cancer in an other family member; Diabetes type II in his mother; Hypertension in his mother; Leukemia in an other family member; Stroke in his brother and mother.    ROS:  Please see the history of present illness.   Otherwise, review of systems is positive for none.   All other systems are reviewed and negative.    PHYSICAL EXAM: VS:  BP (!) 142/64   Pulse 100   Ht 6' (1.829 m)   Wt 154 lb (69.9 kg)   SpO2 98%   BMI 20.89 kg/m  , BMI Body mass index is 20.89 kg/m. GEN: Well nourished, well developed, in no acute distress  HEENT: normal  Neck: no JVD, carotid bruits, or masses Cardiac: RRR; no murmurs, rubs, or gallops,no edema  Respiratory:  clear to auscultation bilaterally, normal work of breathing GI: soft, nontender, nondistended, + BS MS: no deformity or atrophy  Skin: warm and dry Neuro:  Strength and sensation are intact Psych: euthymic mood, full affect  EKG:  EKG is not  ordered today. Personal review of the ekg ordered 04/17/20 shows sinus Daniel, rate 81, atypical left bundle branch block  Recent Labs: 02/10/2020: ALT 39 02/23/2020: TSH 1.654 02/25/2020: Magnesium 1.9 02/28/2020: BUN 7; Creatinine, Ser 0.77; Hemoglobin 10.0; Platelets 351; Potassium 3.4; Sodium 138    Lipid Panel     Component Value Date/Time   CHOL 180 02/23/2020 0251   CHOL 133 06/07/2019 1255   TRIG 123 02/23/2020 0251   HDL 54 02/23/2020 0251   HDL 56 06/07/2019 1255  CHOLHDL 3.3 02/23/2020 0251   VLDL 25 02/23/2020 0251   LDLCALC 101 (H) 02/23/2020 0251   LDLCALC 49 06/07/2019 1255     Wt Readings from Last 3 Encounters:  05/10/20 154 lb (69.9 kg)  04/18/20 162 lb (73.5 kg)  04/17/20 160 lb (72.6 kg)      Other studies Reviewed: Additional studies/ records that were reviewed today include: TTE 02/23/20  Review of the above records today demonstrates:  1. Akinesis of the distal anteroseptal and apical walls with overall  moderate to severe LV dysfunction.  2. Left ventricular ejection fraction, by estimation, is 30 to 35%. The  left ventricle has moderate to severely decreased function. The left  ventricle demonstrates regional wall motion abnormalities (see scoring  diagram/findings for description). Left  ventricular diastolic parameters are consistent with Grade II diastolic  dysfunction (pseudonormalization).  3. Right ventricular systolic function is normal. The right ventricular  size is normal.  4. The mitral valve is normal in structure. No evidence of mitral valve  regurgitation. No evidence of mitral stenosis.  5. The aortic valve has an indeterminant number of cusps. Aortic valve  regurgitation is trivial. No aortic stenosis is present.  6. The inferior vena cava is normal in size with greater than 50%  respiratory variability, suggesting right atrial pressure of 3 mmHg.    ASSESSMENT AND PLAN:  1.  Chronic systolic heart failure secondary  to ischemic cardiomyopathy: NYHA class II currently on optimal medical therapy with Toprol-XL, Entresto, and Aldactone.  Most recent echo with an ejection fraction of 30 to 35%.  He has a left bundle branch block.  He would thus benefit from CRT-D implant.  Risks and benefits of been discussed which include bleeding, tamponade, heart block, stroke, damage to chest organs.  Patient understands these risks and has agreed to the procedure.  2.  Coronary artery disease: Status post LAD stent.  No current chest pain.  Plan per primary cardiology.  3.  Hypertension: Mildly elevated today.  Usually well controlled.  No changes.  4.  Hyperlipidemia: Currently on simvastatin and Zetia per primary cardiology  Case discussed with primary cardiology  Current medicines are reviewed at length with the patient today.   The patient does not have concerns regarding his medicines.  The following changes were made today:  none  Labs/ tests ordered today include:  Orders Placed This Encounter  Procedures  . Basic metabolic panel  . CBC     Disposition:   FU with Johnny Latu 3 months  Signed, Varnell Orvis Meredith Leeds, MD  05/10/2020 10:41 AM     [facility] [address] [place] [address] [phone] (office) [phone] (fax)

## 2020-05-10 NOTE — H&P (View-Only) (Signed)
Electrophysiology Office Note   Date:  05/10/2020   ID:  Daniel Weiss, DOB 1960-07-03, MRN [id number]  PCP:  Azzie Glatter, FNP  Cardiologist:  Ellyn Hack Primary Electrophysiologist:  [name], MD    Chief Complaint: CHF   History of Present Illness: Daniel Weiss is a 60 y.o. male who is being seen today for the evaluation of CHF at the request of [name], MD. Presenting today for electrophysiology evaluation.  He has a history significant for anterior STEMI with PCI to the LAD in 2019, ischemic cardiomyopathy with chronic combined systolic and diastolic heart failure, left bundle branch block, CVA.  He currently has prostate cancer and is now undergoing XRT.  He is not on oral medications for this any longer.  He gets dyspnea on exertion with mild activity such as making the bed and light walks.  He also gets intermittent episodes of chest pain requiring sublingual nitroglycerin.  Today, he denies symptoms of palpitations, chest pain, orthopnea, PND, lower extremity edema, claudication, dizziness, presyncope, syncope, bleeding, or neurologic sequela. The patient is tolerating medications without difficulties.  His shortness of breath has remained relatively stable.  He currently has no chest pain.   Past Medical History:  Diagnosis Date  . Abnormal chest CT 02/2018   a. Ectatic 3 cm Ao arch - rec f/u outpt imaging w/ CTA or MRA. Ectatic atheromatous abd Ao @ risk for aneurysm - rec f/u u/s in 5 yrs. Marked prostatic enlargement w/ scattered small sclerotic foci in the thoracic lower lumbar spine, sacrum, left 12th rib, pelvis, and right femur.  Recommend elective outpatient whole-body bone scintigraphy and PSA.  Marland Kitchen Abnormal CT scan 03/10/2018  . Acute blood loss anemia   . Acute cardioembolic stroke ([name])   . Acute cerebrovascular accident (CVA) ([place]) 11/03/2018  . 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 ARF (acute renal failure) ([place]) 06/29/2015  . Benign neoplasm  of colon   . CAD (coronary artery disease)    a. 02/2018 ACS/PCI: LM nl, LAD 85p (3.5x15 Sierra DES), D1 45ost, RI 55ost, RCA nl, EF 25-35%.  . Cardiac murmur   . Chest pain on exertion 11/28/2019  . Chronic combined systolic and diastolic CHF, NYHA class 2 ([place]) 04/17/2020  . Complete left bundle branch block (LBBB) 2016  . Coronary artery disease involving native coronary artery of native heart with angina pectoris ([place]) 03/10/2018  . Dyspnea   . Encephalopathy, hypertensive   . Essential hypertension 07/07/2014  . 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 Goals of care, counseling/discussion 12/22/2019  . Hematemesis without nausea   . HFrEF (heart failure with reduced ejection fraction) ([place])   . History of CVA (cerebrovascular accident) 11/23/2018  . History of non-ST elevation myocardial infarction (NSTEMI) 02/21/2019  . Hypertension   . Hypokalemia   . Hyponatremia 06/29/2015  . Ischemic cardiomyopathy 02/2018   a. 02/2018 Echo: EF 35-40% (LV gram on cath was 25-30%), mod, diff HK. mid-apicalanteroseptal, ant, and apical sev HK. RVH.-->  No notable change on echo from June 2020 and August 2020.  Marland Kitchen Localized swelling, mass or lump of neck   . Long term (current) use of anticoagulants 11/16/2018  . Malnutrition of moderate degree 11/11/2018  . Mass of right side of neck 07/07/2014  . Myocardial infarction ([place])   . Neck mass    Right Neck mass - US neck showed 6.3 cm complex, partially cystic, partially solid mass. CT neck showed 2 cm mass from parotid gland on  right, with 3x4x5cm Possible metastatic lymphadenopathy. we recommended following up with Dr. Redmond Baseman ENT for biopsy of this.     . Non-ST elevation (NSTEMI) myocardial infarction ([place]) 03/06/2018  . NSTEMI (non-ST elevated myocardial infarction) ([place]) 02/2018   85% pLAD - DES PCI   . Paresthesia 07/07/2014  . Prostate cancer ([place]) 03/10/2018  . Prostate cancer metastatic to multiple sites [facility]) 11/2018  . Right sided weakness 07/07/2014  .  Stroke ([place])   . TIA (transient ischemic attack) 2016  . Warthin's tumor    Past Surgical History:  Procedure Laterality Date  . COLONOSCOPY WITH PROPOFOL N/A 02/27/2020   Procedure: COLONOSCOPY WITH PROPOFOL;  Surgeon: Yetta Flock, MD;  Location: [facility];  Service: Gastroenterology;  Laterality: N/A;  . CORONARY STENT INTERVENTION N/A 03/06/2018   Procedure: CORONARY STENT INTERVENTION;  Surgeon: [name], MD;  Location: [facility];  Service: Cardiovascular: Proximal LAD 85% stenosis (and D1): DES PCI with Xience Sierra DES 3.5 mm x 15 mm - 3.9 mm  . ESOPHAGOGASTRODUODENOSCOPY (EGD) WITH PROPOFOL N/A 02/27/2020   Procedure: ESOPHAGOGASTRODUODENOSCOPY (EGD) WITH PROPOFOL;  Surgeon: Yetta Flock, MD;  Location: [facility] ENDOSCOPY;  Service: Gastroenterology;  Laterality: N/A;  . HEMOSTASIS CLIP PLACEMENT  02/27/2020   Procedure: HEMOSTASIS CLIP PLACEMENT;  Surgeon: Yetta Flock, MD;  Location: [facility] ENDOSCOPY;  Service: Gastroenterology;;  . HOT HEMOSTASIS N/A 02/27/2020   Procedure: HOT HEMOSTASIS (ARGON PLASMA COAGULATION/BICAP);  Surgeon: Yetta Flock, MD;  Location[facility] ENDOSCOPY;  Service: Gastroenterology;  Laterality: N/A;  . INTRAOPERATIVE TRANSTHORACIC ECHOCARDIOGRAM  03/06/2018   (Peri-MI) mild reduced EF 35-40%.  Diffuse hypokinesis (severe HK of the mid-apical anteroseptal, anterior and apical myocardium consistent with LAD infarct).  Unable to assess diastolic function.  Paradoxical septal motion likely related to his LBBB.  RV hypertrophy noted.  Trivial pericardial effusion noted.   Marland Kitchen LEFT HEART CATH AND CORONARY ANGIOGRAPHY N/A 03/06/2018   Procedure: LEFT HEART CATH AND CORONARY ANGIOGRAPHY;  Surgeon: [name], MD;  Location: [place] [facility];  Service: Cardiovascular: Proximal LAD 85% involving D1 45%.  Ostial RI 55%.  Severe LV dysfunction EF 25 to 30%.  1+ MR.  Only minimally elevated LVEDP.  Marland Kitchen ORCHIECTOMY Right 01/17/2020    Procedure: ORCHIECTOMY;  Surgeon: Abbie Sons, MD;  Location: [facility] ORS;  Service: Urology;  Laterality: Right;  . POLYPECTOMY  02/27/2020   Procedure: POLYPECTOMY;  Surgeon: Yetta Flock, MD;  Location: [facility] ENDOSCOPY;  Service: Gastroenterology;;  . PROSTATE BIOPSY N/A 01/17/2020   Procedure: BIOPSY TRANSRECTAL ULTRASONIC PROSTATE (TUBP);  Surgeon: Abbie Sons, MD;  Location: [facility] ORS;  Service: Urology;  Laterality: N/A;  . TRANSTHORACIC ECHOCARDIOGRAM  10/2018   (Most recent echo, to evaluate for TIA/CVA) : Moderate reduced EF 35 to 40%.  Moderate concentric LVH.  Mild dyskinesis of the apex and severe hypokinesis of mid apical anteroseptal and anterior wall.  Unable to assess RV pressures.  Aortic sclerosis with no stenosis.  No significant change seen     Current Outpatient Medications  Medication Sig Dispense Refill  . albuterol (VENTOLIN HFA) 108 (90 Base) MCG/ACT inhaler Inhale 2 puffs into the lungs every 6 (six) hours as needed for wheezing or shortness of breath. 8 g 11  . bicalutamide (CASODEX) 50 MG tablet Take 1 tablet by mouth twice daily 60 tablet 0  . clopidogrel (PLAVIX) 75 MG tablet Take 1 tablet (75 mg total) by mouth daily.    Marland Kitchen ezetimibe (ZETIA) 10  MG tablet Take 1 tablet (10 mg total) by mouth daily. 90 tablet 3  . furosemide (LASIX) 20 MG tablet May take 20 mg tablet as needed for increase shortness of breath or swelling 20 tablet 6  . metoprolol succinate (TOPROL XL) 25 MG 24 hr tablet Take 1 tablet (25 mg total) by mouth daily. 90 tablet 3  . nitroGLYCERIN (NITROSTAT) 0.4 MG SL tablet Place 1 tablet (0.4 mg total) under the tongue every 5 (five) minutes as needed for chest pain. 25 tablet 3  . ondansetron (ZOFRAN) 4 MG tablet Take 4 mg by mouth every 4 (four) hours as needed for nausea or vomiting.     . rosuvastatin (CRESTOR) 40 MG tablet Take 1 tablet by mouth once daily 30 tablet 1  . sacubitril-valsartan (ENTRESTO) 49-51 MG Take 1 tablet by mouth 2  (two) times daily. 60 tablet 6  . spironolactone (ALDACTONE) 25 MG tablet Take 1 tablet (25 mg total) by mouth daily. 90 tablet 3  . tamsulosin (FLOMAX) 0.4 MG CAPS capsule Take 1 capsule (0.4 mg total) by mouth daily after supper. 90 capsule 3  . traZODone (DESYREL) 50 MG tablet Take 0.5-1 tablets (25-50 mg total) by mouth at bedtime as needed for sleep. 30 tablet 6  . venlafaxine XR (EFFEXOR XR) 37.5 MG 24 hr capsule Take 1 capsule (37.5 mg total) by mouth daily with breakfast. 30 capsule 1   No current facility-administered medications for this visit.    Allergies:   Patient has no known allergies.   Social History:  The patient  reports that he has been smoking cigarettes. He has been smoking about 0.00 packs per day for the past 0.00 years. He has never used smokeless tobacco. He reports current alcohol use of about 2.0 standard drinks of alcohol per week. He reports current drug use. Frequency: 3.00 times per week. Drug: Marijuana.   Family History:  The patient's family history includes Breast cancer in an other family member; Diabetes type II in his mother; Hypertension in his mother; Leukemia in an other family member; Stroke in his brother and mother.    ROS:  Please see the history of present illness.   Otherwise, review of systems is positive for none.   All other systems are reviewed and negative.    PHYSICAL EXAM: VS:  BP (!) 142/64   Pulse 100   Ht 6' (1.829 m)   Wt 154 lb (69.9 kg)   SpO2 98%   BMI 20.89 kg/m  , BMI Body mass index is 20.89 kg/m. GEN: Well nourished, well developed, in no acute distress  HEENT: normal  Neck: no JVD, carotid bruits, or masses Cardiac: RRR; no murmurs, rubs, or gallops,no edema  Respiratory:  clear to auscultation bilaterally, normal work of breathing GI: soft, nontender, nondistended, + BS MS: no deformity or atrophy  Skin: warm and dry Neuro:  Strength and sensation are intact Psych: euthymic mood, full affect  EKG:  EKG is not  ordered today. Personal review of the ekg ordered 04/17/20 shows sinus rhythm, rate 81, atypical left bundle branch block  Recent Labs: 02/10/2020: ALT 39 02/23/2020: TSH 1.654 02/25/2020: Magnesium 1.9 02/28/2020: BUN 7; Creatinine, Ser 0.77; Hemoglobin 10.0; Platelets 351; Potassium 3.4; Sodium 138    Lipid Panel     Component Value Date/Time   CHOL 180 02/23/2020 0251   CHOL 133 06/07/2019 1255   TRIG 123 02/23/2020 0251   HDL 54 02/23/2020 0251   HDL 56 06/07/2019 1255  CHOLHDL 3.3 02/23/2020 0251   VLDL 25 02/23/2020 0251   LDLCALC 101 (H) 02/23/2020 0251   LDLCALC 49 06/07/2019 1255     Wt Readings from Last 3 Encounters:  05/10/20 154 lb (69.9 kg)  04/18/20 162 lb (73.5 kg)  04/17/20 160 lb (72.6 kg)      Other studies Reviewed: Additional studies/ records that were reviewed today include: TTE 02/23/20  Review of the above records today demonstrates:  1. Akinesis of the distal anteroseptal and apical walls with overall  moderate to severe LV dysfunction.  2. Left ventricular ejection fraction, by estimation, is 30 to 35%. The  left ventricle has moderate to severely decreased function. The left  ventricle demonstrates regional wall motion abnormalities (see scoring  diagram/findings for description). Left  ventricular diastolic parameters are consistent with Grade II diastolic  dysfunction (pseudonormalization).  3. Right ventricular systolic function is normal. The right ventricular  size is normal.  4. The mitral valve is normal in structure. No evidence of mitral valve  regurgitation. No evidence of mitral stenosis.  5. The aortic valve has an indeterminant number of cusps. Aortic valve  regurgitation is trivial. No aortic stenosis is present.  6. The inferior vena cava is normal in size with greater than 50%  respiratory variability, suggesting right atrial pressure of 3 mmHg.    ASSESSMENT AND PLAN:  1.  Chronic systolic heart failure secondary  to ischemic cardiomyopathy: NYHA class II currently on optimal medical therapy with Toprol-XL, Entresto, and Aldactone.  Most recent echo with an ejection fraction of 30 to 35%.  He has a left bundle branch block.  He would thus benefit from CRT-D implant.  Risks and benefits of been discussed which include bleeding, tamponade, heart block, stroke, damage to chest organs.  Patient understands these risks and has agreed to the procedure.  2.  Coronary artery disease: Status post LAD stent.  No current chest pain.  Plan per primary cardiology.  3.  Hypertension: Mildly elevated today.  Usually well controlled.  No changes.  4.  Hyperlipidemia: Currently on simvastatin and Zetia per primary cardiology  Case discussed with primary cardiology  Current medicines are reviewed at length with the patient today.   The patient does not have concerns regarding his medicines.  The following changes were made today:  none  Labs/ tests ordered today include:  Orders Placed This Encounter  Procedures  . Basic metabolic panel  . CBC     Disposition:   FU with Reiss Mowrey 3 months  Signed, Tenzin Pavon Meredith Leeds, MD  05/10/2020 10:41 AM     [facility] [address] [place] [address] [phone] (office) [phone] (fax)

## 2020-05-10 NOTE — Patient Instructions (Addendum)
Medication Instructions:  Your physician recommends that you continue on your current medications as directed. Please refer to the Current Medication list given to you today.  *If you need a refill on your cardiac medications before your next appointment, please call your pharmacy*   Lab Work: Pre procedure labs today: BMET & CBC If you have labs (blood work) drawn today and your tests are completely normal, you will receive your results only by: Marland Kitchen MyChart Message (if you have MyChart) OR . A paper copy in the mail If you have any lab test that is abnormal or we need to change your treatment, we will call you to review the results.   Testing/Procedures: Your physician has recommended that you have a defibrillator inserted. An implantable cardioverter defibrillator (ICD) is a small device that is placed in your chest or, in rare cases, your abdomen. This device uses electrical pulses or shocks to help control life-threatening, irregular heartbeats that could lead the heart to suddenly stop beating (sudden cardiac arrest). Leads are attached to the ICD that goes into your heart. This is done in the hospital and usually requires an overnight stay. Please see the instruction sheet below located under "other instructions".     Follow-Up: At [facility], you and your health needs are our priority.  As part of our continuing mission to provide you with exceptional heart care, we have created designated Provider Care Teams.  These Care Teams include your primary Cardiologist (physician) and Advanced Practice Providers (APPs -  Physician Assistants and Nurse Practitioners) who all work together to provide you with the care you need, when you need it.  We recommend signing up for the patient portal called "MyChart".  Sign up information is provided on this After Visit Summary.  MyChart is used to connect with patients for Virtual Visits (Telemedicine).  Patients are able to view lab/test results,  encounter notes, upcoming appointments, etc.  Non-urgent messages can be sent to your provider as well.   To learn more about what you can do with MyChart, go to [email or web address].    Your next appointment:   2 week(s) after device implant  The format for your next appointment:   In Person  Provider:   Allegra Lai, MD    Thank you for choosing [facility]!!   Trinidad Curet, RN [phone]   Other Instructions   Implantable Device Instructions  You are scheduled for: BiVentricular implantable cardiac defibrillator (ICD) on 06/06/2020 with Dr. Curt Bears.  1.   Pre procedure testing-             A.  LAB WORK--- On 05/10/20  for your pre procedure blood work.  You do NOT need to be fasting.              B. COVID TEST-- On 06/04/2020 @ 11:00 am - This is a Drive Up Visit at [address] [place], [place] [address].  Someone will direct you to the appropriate testing line. Stay in your car and someone will be with you shortly.   After you are tested please go home and self quarantine until the day of your procedure.    2. On the day of your procedure 06/06/2020 you will go to [facility] [phone]) at 11:30 am.  Dennis Bast will go to the main entrance [address] [employer]) and enter where the [employer] are.  You will check in at ADMITTING.  You may have one support person come  in to the hospital with you.  They will be asked to wait in the waiting room.   3.   Do not eat or drink after midnight prior to your procedure.   4.   On the morning of your procedure do NOT take any medication.  5.  The night before your procedure and the morning of your procedure scrub your neck/chest with surgical scrub.  See instruction letter below.   5.  Plan for an overnight stay, but you may be discharged home after your procedure. If you use your phone frequently bring your phone charger, in case you have to stay.  If you are discharged after your procedure you will need  someone to drive you home and be with your for 24 hours after your procedure.   6.  You will follow up with the [facility] 10-14 days after your procedure. You will follow up with Dr. Curt Bears 91 days after your procedure.  These appointments will be made for you.   * If you have ANY questions after you get home, please call the office [phone] and ask for Irvine Glorioso RN or send a MyChart message.    [place] - Preparing For Surgery (surgical scrub)  Before surgery, you can play an important role. Because skin is not sterile, your skin needs to be as free of germs as possible. You can reduce the number of germs on your skin by washing with CHG (chlorahexidine gluconate) Soap before surgery.  CHG is an antiseptic cleaner which kills germs and bonds with the skin to continue killing germs even after washing.   Please do not use if you have an allergy to CHG or antibacterial soaps.  If your skin becomes reddened/irritated stop using the CHG.   Do not shave (including legs and underarms) for at least 48 hours prior to first CHG shower.  It is OK to shave your face.  Please follow these instructions carefully:  1.  Shower the night before surgery and the morning of surgery with CHG.  2.  If you choose to wash your hair, wash your hair first as usual with your normal shampoo.  3.  After you shampoo, rinse your hair and body thoroughly to remove the shampoo.  4.  Use CHG as you would any other liquid soap.  You can apply CHG directly to the skin and wash gently with a clean washcloth. 5.  Apply the CHG Soap to your body ONLY FROM THE NECK DOWN.  Do not use on open wounds or open sores.  Avoid contact with your eyes, ears, mouth and genitals (private parts).  Wash genitals (private parts) with your normal soap.  6.  Wash thoroughly, paying special attention to the area where your surgery will be performed.  7.  Thoroughly rinse your body with warm water from the neck down.   8.  DO  NOT shower/wash with your normal soap after using and rinsing off the CHG soap.  9.  Pat yourself dry with a clean towel.           10.  Wear clean pajamas.           11.  Place clean sheets on your bed the night of your first shower and do not sleep with pets.  Day of Surgery: Do not apply any deodorants/lotions.  Please wear clean clothes to the hospital/surgery center.    Cardioverter Defibrillator Implantation An implantable cardioverter defibrillator (ICD) is a device that  identifies and corrects abnormal heart rhythms. Cardioverter defibrillator implantation is a surgery to place an ICD under the skin in the chest or abdomen. An ICD has a battery, a small computer (pulse generator), and wires (leads) that go into the heart. The ICD detects and corrects two types of dangerous irregular heart rhythms (arrhythmias):  A rapid heart rhythm in the lower chambers of the heart (ventricles). This is called ventricular tachycardia.  The ventricles contracting in an uncoordinated way. This is called ventricular fibrillation. There are different types of ICDs, and the electrical signals from the ICD can be programmed differently based on the condition being treated. The electrical signals from the ICD can be low-energy pulses, high-energy shocks, or a combination of the two. The low-energy pulses are generally used to restore the heartbeat to normal when it is either too slow (bradycardia) or too fast. These pulses are painless. The high-energy shocks are used to treat abnormal rhythms such as ventricular tachycardia or ventricular fibrillation. This shock may feel like a strong jolt in the chest. Your health care provider may recommend an ICD if you have:  Had a ventricular arrhythmia in the past.  A damaged heart because of a disease or heart condition.  A weakened heart muscle from a heart attack or cardiac arrest.  A congenital heart defect.  Long QT syndrome, which is a disorder of the  heart's electrical system.  Brugada syndrome, which is a condition that causes a disruption of the heart's normal rhythm. Tell a health care provider about:  Any allergies you have.  All medicines you are taking, including vitamins, herbs, eye drops, creams, and over-the-counter medicines.  Any problems you or family members have had with anesthetic medicines.  Any blood disorders you have.  Any surgeries you have had.  Any medical conditions you have.  Whether you are pregnant or may be pregnant. What are the risks? Generally, this is a safe procedure. However, problems may occur, including:  Infection.  Bleeding.  Allergic reactions to medicines used during the procedure.  Blood clots.  Swelling or bruising.  Damage to nearby structures or organs, such as nerves, lungs, blood vessels, or the heart where the ICD leads or pulse generator is implanted. What happens before the procedure? Staying hydrated Follow instructions from your health care provider about hydration, which may include:  Up to 2 hours before the procedure - you may continue to drink clear liquids, such as water, clear fruit juice, black coffee, and plain tea.   Eating and drinking restrictions Follow instructions from your health care provider about eating and drinking, which may include:  8 hours before the procedure - stop eating heavy meals or foods, such as meat, fried foods, or fatty foods.  6 hours before the procedure - stop eating light meals or foods, such as toast or cereal.  6 hours before the procedure - stop drinking milk or drinks that contain milk.  2 hours before the procedure - stop drinking clear liquids. Medicines Ask your health care provider about:  Changing or stopping your regular medicines. This is especially important if you are taking diabetes medicines or blood thinners.  Taking medicines such as aspirin and ibuprofen. These medicines can thin your blood. Do not take  these medicines unless your health care provider tells you to take them.  Taking over-the-counter medicines, vitamins, herbs, and supplements. Tests You may have an exam or testing. These may include:  Blood tests.  A test to check the electrical signals  in your heart (electrocardiogram, ECG).  Imaging tests, such as a chest X-ray.  Echocardiogram. This is an ultrasound of your heart to evaluate your heart structures and function.  An event monitor or Holter monitor to wear at home. General instructions  Do not use any products that contain nicotine or tobacco for at least 4 weeks before the procedure. These products include cigarettes, chewing tobacco, and vaping devices, such as e-cigarettes. If you need help quitting, ask your health care provider.  Ask your health care provider: ? How your procedure site will be marked. ? What steps will be taken to help prevent infection. These may include:  Removing hair at the surgery site.  Washing skin with a germ-killing soap.  Taking antibiotic medicine.  You may be asked to shower with a germ-killing soap.  Plan to have a responsible adult take you home from the hospital or clinic. What happens during the procedure?  Small monitors will be put on your body. They will be used to check your heart rate, blood pressure, and oxygen level.  A pair of sticky pads (defibrillator pads) may be placed on your back and chest. These pads are able to pace your heart as needed during the procedure.  An IV will be inserted into one of your veins.  You will be given one or more of the following: ? A medicine to help you relax (sedative). ? A medicine to numb the area (local anesthetic). ? A medicine to make you fall asleep(general anesthetic).  A small incision will be made to create a deep pocket under the skin of your chest or abdomen.  Leads will be guided through a blood vessel into your heart and attached to your heart muscles.  Depending on the ICD, the leads may go into one ventricle, or they may go into both ventricles and into an upper chamber of the heart. An X-ray machine (fluoroscope) will be used to help guide the leads. The other end of the leads will be attached to the pulse generator.  The pulse generator will be placed into the pocket under the skin.  The ICD will be tested, and your health care provider will program the ICD for the condition being treated.  The incision will be closed with stitches (sutures), skin glue, adhesive strips, or staples.  A bandage (dressing) will be placed over the incision. The procedure may vary among health care providers and hospitals.   What happens after the procedure?  Your blood pressure, heart rate, breathing rate, and blood oxygen level will be monitored until you leave the hospital or clinic. Your health care provider will also monitor your ICD to make sure it is working properly.  A chest X-ray will be taken to check that the ICD is in the right place.  Do not raise the arm on the side of your procedure higher than your shoulder for as long as told by your health care provider. This is usually at least 6 weeks.  You may be given an identification card explaining that you have an ICD.  You will be given a remote home monitoring device to use with your ICD to allow your device to communicate with your clinic. Summary  An implantable cardioverter defibrillator (ICD) is a device that identifies and corrects abnormal heart rhythms. Cardioverter defibrillator implantation is a surgery to place an ICD under the skin in the chest or abdomen.  An ICD consists of a battery, a small computer (pulse generator), and wires (leads)  that go into the heart.  During the procedure, the ICD will be tested, and your health care provider will program the ICD for the condition being treated.  After the procedure, a chest X-ray will be taken to check that the ICD is in the right  place. This information is not intended to replace advice given to you by your health care provider. Make sure you discuss any questions you have with your health care provider. Document Revised: 08/24/2019 Document Reviewed: 08/24/2019 Elsevier Patient Education  [place].

## 2020-05-11 ENCOUNTER — Ambulatory Visit
Admission: RE | Admit: 2020-05-11 | Discharge: 2020-05-11 | Disposition: A | Discharge status: Home / Self Care | Payer: Medicaid Other | Source: Ambulatory Visit | Attending: Radiation Oncology | Admitting: Radiation Oncology

## 2020-05-11 DIAGNOSIS — C61 Malignant neoplasm of prostate: Secondary | ICD-10-CM | POA: Diagnosis not present

## 2020-05-25 ENCOUNTER — Inpatient Hospital Stay: Payer: Medicaid Other | Attending: Oncology

## 2020-05-25 DIAGNOSIS — C61 Malignant neoplasm of prostate: Secondary | ICD-10-CM | POA: Insufficient documentation

## 2020-05-25 DIAGNOSIS — C6291 Malignant neoplasm of right testis, unspecified whether descended or undescended: Secondary | ICD-10-CM | POA: Insufficient documentation

## 2020-05-25 DIAGNOSIS — R221 Localized swelling, mass and lump, neck: Secondary | ICD-10-CM | POA: Insufficient documentation

## 2020-05-25 DIAGNOSIS — Z8249 Family history of ischemic heart disease and other diseases of the circulatory system: Secondary | ICD-10-CM | POA: Insufficient documentation

## 2020-05-25 DIAGNOSIS — F1290 Cannabis use, unspecified, uncomplicated: Secondary | ICD-10-CM | POA: Insufficient documentation

## 2020-05-25 DIAGNOSIS — Z823 Family history of stroke: Secondary | ICD-10-CM | POA: Insufficient documentation

## 2020-05-25 DIAGNOSIS — F17210 Nicotine dependence, cigarettes, uncomplicated: Secondary | ICD-10-CM | POA: Insufficient documentation

## 2020-05-25 DIAGNOSIS — Z79899 Other long term (current) drug therapy: Secondary | ICD-10-CM | POA: Insufficient documentation

## 2020-05-25 DIAGNOSIS — I5022 Chronic systolic (congestive) heart failure: Secondary | ICD-10-CM | POA: Insufficient documentation

## 2020-05-25 DIAGNOSIS — C7951 Secondary malignant neoplasm of bone: Secondary | ICD-10-CM | POA: Insufficient documentation

## 2020-05-25 DIAGNOSIS — I10 Essential (primary) hypertension: Secondary | ICD-10-CM | POA: Insufficient documentation

## 2020-05-25 DIAGNOSIS — Z803 Family history of malignant neoplasm of breast: Secondary | ICD-10-CM | POA: Insufficient documentation

## 2020-05-25 DIAGNOSIS — R0602 Shortness of breath: Secondary | ICD-10-CM | POA: Insufficient documentation

## 2020-05-25 DIAGNOSIS — I255 Ischemic cardiomyopathy: Secondary | ICD-10-CM | POA: Insufficient documentation

## 2020-05-25 DIAGNOSIS — Z806 Family history of leukemia: Secondary | ICD-10-CM | POA: Insufficient documentation

## 2020-05-25 DIAGNOSIS — Z833 Family history of diabetes mellitus: Secondary | ICD-10-CM | POA: Insufficient documentation

## 2020-05-28 ENCOUNTER — Inpatient Hospital Stay: Payer: Medicaid Other

## 2020-05-28 ENCOUNTER — Encounter: Payer: Self-pay | Admitting: Oncology

## 2020-05-28 ENCOUNTER — Other Ambulatory Visit: Payer: Self-pay

## 2020-05-28 ENCOUNTER — Inpatient Hospital Stay (HOSPITAL_BASED_OUTPATIENT_CLINIC_OR_DEPARTMENT_OTHER): Payer: Medicaid Other | Admitting: Oncology

## 2020-05-28 VITALS — BP 154/87 | HR 91 | Temp 98.0°F | Resp 18 | Wt 151.7 lb

## 2020-05-28 DIAGNOSIS — C7951 Secondary malignant neoplasm of bone: Secondary | ICD-10-CM | POA: Diagnosis not present

## 2020-05-28 DIAGNOSIS — R221 Localized swelling, mass and lump, neck: Secondary | ICD-10-CM | POA: Diagnosis not present

## 2020-05-28 DIAGNOSIS — C801 Malignant (primary) neoplasm, unspecified: Secondary | ICD-10-CM | POA: Diagnosis not present

## 2020-05-28 DIAGNOSIS — R0602 Shortness of breath: Secondary | ICD-10-CM | POA: Diagnosis not present

## 2020-05-28 DIAGNOSIS — Z8249 Family history of ischemic heart disease and other diseases of the circulatory system: Secondary | ICD-10-CM | POA: Diagnosis not present

## 2020-05-28 DIAGNOSIS — I255 Ischemic cardiomyopathy: Secondary | ICD-10-CM | POA: Diagnosis not present

## 2020-05-28 DIAGNOSIS — Z806 Family history of leukemia: Secondary | ICD-10-CM | POA: Diagnosis not present

## 2020-05-28 DIAGNOSIS — K118 Other diseases of salivary glands: Secondary | ICD-10-CM

## 2020-05-28 DIAGNOSIS — C61 Malignant neoplasm of prostate: Secondary | ICD-10-CM

## 2020-05-28 DIAGNOSIS — Z833 Family history of diabetes mellitus: Secondary | ICD-10-CM | POA: Diagnosis not present

## 2020-05-28 DIAGNOSIS — F1290 Cannabis use, unspecified, uncomplicated: Secondary | ICD-10-CM | POA: Diagnosis not present

## 2020-05-28 DIAGNOSIS — Z823 Family history of stroke: Secondary | ICD-10-CM | POA: Diagnosis not present

## 2020-05-28 DIAGNOSIS — C6291 Malignant neoplasm of right testis, unspecified whether descended or undescended: Secondary | ICD-10-CM | POA: Diagnosis not present

## 2020-05-28 DIAGNOSIS — Z79899 Other long term (current) drug therapy: Secondary | ICD-10-CM | POA: Diagnosis not present

## 2020-05-28 DIAGNOSIS — I5022 Chronic systolic (congestive) heart failure: Secondary | ICD-10-CM | POA: Diagnosis not present

## 2020-05-28 DIAGNOSIS — F17210 Nicotine dependence, cigarettes, uncomplicated: Secondary | ICD-10-CM | POA: Diagnosis not present

## 2020-05-28 DIAGNOSIS — I10 Essential (primary) hypertension: Secondary | ICD-10-CM | POA: Diagnosis not present

## 2020-05-28 DIAGNOSIS — Z803 Family history of malignant neoplasm of breast: Secondary | ICD-10-CM | POA: Diagnosis not present

## 2020-05-28 NOTE — Progress Notes (Signed)
Hematology/Oncology follow up note [facility] Telephone:([phone] Fax:([phone]   Patient Care Team: Azzie Glatter, FNP as PCP - General (Family Medicine) [name], MD as PCP - Cardiology (Cardiology) Constance Haw, MD as PCP - Electrophysiology (Cardiology) McKenzie, Candee Furbish, MD as Consulting Physician (Urology)  REFERRING PROVIDER: Azzie Glatter, FNP  CHIEF COMPLAINTS/REASON FOR VISIT:  Follow up for prostate cancer  HISTORY OF PRESENTING ILLNESS:   Daniel Weiss is a  60 y.o.  male with PMH listed below was seen in consultation at the request of  Azzie Glatter, FNP  for evaluation of presumed prostate cancer Patient recently switched his care to [facility] and was seen by Dr. [name]. He was previously followed up at [facility] urology I reviewed Dr. Dene Gentry note. Patient was diagnosed with presumed prostate cancer on 10/2018 with a PSA of 509, incidental findings of multiple sclerotic lesions on CT-chest abdomen pelvis during work-up for a CVA.  His previous urology care was with Dr. Alyson Ingles. 11/05/2018 bone scan showed solitary punctate focus of activity in the left first sacral segment corresponding to a 9 mm mixed lytic and sclerotic metastatic's on recent CT.  The remaining numerous sclerotic osseous metastasis identified on CT do not demonstrate abnormal activity and are therefore healed.  At that time No tissue diagnosis/biopsy was obtained. Patient was started with Lupron and Casodex Lupron was eventually to Eligard [facility], PSA was 13.13, testosterone level less than 10. Casodex was discontinued in June 2021.   10/26/2019 patient switched to [facility] urology and was seen by Dr. [name] Per urology note, PSA trend: 11/03/2018: 502 11/22/2018: 377 02/21/2019: 387 04/25/2019: 123 09/05/2019: 13.3  Patient gets androgen deprivation therapy through  Dr. Dene Gentry office.  Eligard 45mg   on 11/11/2019 -Patient was referred to heme-onc on 11/01/2019.  10/06/2019 bone scan showed resolution of punctated activity over the left sacrum.  The punctate area of increased activity over the right lower lumbar spine again noted. 10/31/2019, CT angio chest aorta showed no evidence of aortic aneurysm or dissection.  Atherosclerotic calcification spleen seen in the upper abdominal aorta.  Chronic artery disease.  No acute cardiopulmonary disease. Patient has a chronic right neck mass.  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   11/18/2019 CT abdomen pelvis with contrast showed retrocaval lymphadenopathy in the right common and external iliac chains.  Stable to minimally progressed in the interval since last CT scan 11/03/2018 Numerous sclerotic bone lesions.  Stable 3 mm subpleural right middle lobe lung nodule stable.  Aortic atherosclerosis   Patient has extensive cardiology issues.  He follows up with Dr. Ellyn Hack. Patient has history of STEMI-PCI to LAD with resultant ischemia cardiomyopathy, EF 35 to 40%, left bundle branch block with acute CVA-10/2018.  CT coronary showed nonobstructing plaquing of coronary distributions, intimal irregularity with filling defect in the ascending aorta but no LV thrombus.  And the patient is on warfarin and Plavix.  Patient was seen by thoracic surgeon Dr. Roxy Manns will recommend patient to be on Coumadin for minimum of 3 months.  With presentation with stroke, Dr. Ellyn Hack recommend at least 6 to 12 months of Coumadin. -Coumadin was discontinued by Dr. Ellyn Hack in September 2021 and switched to.  Plavix Patient lives with his sister.  He has 3 adult children.  His activity is quite  limited due to shortness of breath with exertion due to cardiology problems.  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 Tumor markers Showed normal LDH, beta hCG, AFP # 01/17/2020 scrotum ultrasound showed a small right testicular mass measuring 1.6 cm, contained an internal calcification.  Appearance was consistent with primary testicular neoplasm.  Normal size and appearance of the left testicle.  #Prostate cancer, metastatic M1 A.  On androgen deprivation therapy.  Eligard through Dr. Dene Gentry office. 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  #01/17/2020 right orchiectomy residual germ cell tumor and germ cell neoplasia in situ are not identified.  Case was discussed on tumor board.  Consensus reached a point although residual germ cell tumor/infection was not identified, the presence of scar with extensive tubular atrophy is compatible with a completely regressed germ cell tumor.  He went to [facility] for second opinion as I recommended. Due to long waiting time, he left without being seen. Dr.Rush from [facility] called and agrees with patient current treatment.   INTERVAL HISTORY [name] is a 60 y.o. male who has above history reviewed by me today presents for follow up visit for management of metastatic prostate cancer and germ cell tumor.  Problems and complaints are listed below: Eligard 45mg   on 11/11/2019 Dr. Dene Gentry office.  Status post radiation to periaortic lymph nodes as well as right hemipelvis node. Patient missed his appointment with urologist on 05/11/2019 for Eligard treatments. Patient reports dizziness for the past 4 weeks.  Chronic shortness of breath with exertion.  He has chronic systolic heart failure secondary to ischemic cardiomyopathy.  He has an elective procedure coming up for CRT-D implant.    Review of Systems  Constitutional: Negative for  appetite change, chills, fatigue, fever and unexpected weight change.  HENT:   Negative for hearing loss and voice change.   Eyes: Negative for eye problems and icterus.  Respiratory: Positive for shortness of breath. Negative for chest tightness and cough.   Cardiovascular: Negative for chest pain and leg swelling.  Gastrointestinal: Negative for abdominal distention and abdominal pain.  Endocrine: Negative for hot flashes.  Genitourinary: Negative for difficulty urinating, dysuria and frequency.   Musculoskeletal: Negative for arthralgias.       Left hip pain  Skin: Negative for itching and rash.  Neurological: Negative for dizziness, light-headedness and numbness.  Hematological: Negative for adenopathy. Does not bruise/bleed easily.  Psychiatric/Behavioral: Negative for confusion.    MEDICAL HISTORY:  Past Medical History:  Diagnosis Date  . Abnormal chest CT 02/2018   a. Ectatic 3 cm Ao arch - rec f/u outpt imaging w/ CTA or MRA. Ectatic atheromatous abd Ao @ risk for aneurysm - rec f/u u/s in 5 yrs. Marked prostatic enlargement w/ scattered small sclerotic foci in the thoracic lower lumbar spine, sacrum, left 12th rib, pelvis, and right femur.  Recommend elective outpatient whole-body bone scintigraphy and PSA.  Marland Kitchen Abnormal CT scan 03/10/2018  . Acute blood loss anemia   . Acute cardioembolic stroke ([place])   . Acute cerebrovascular accident (CVA) ([place]) 11/03/2018  . 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 ARF (acute renal failure) ([place]) 06/29/2015  . Benign neoplasm of colon   . CAD (coronary artery disease)    a. 02/2018 ACS/PCI: LM nl, LAD 85p (3.5x15  Sierra DES), D1 45ost, RI 55ost, RCA nl, EF 25-35%.  . Cardiac murmur   . Chest pain on exertion 11/28/2019  . Chronic combined systolic and diastolic CHF, NYHA class 2 ([place]) 04/17/2020  . Complete left bundle branch block (LBBB) 2016  . Coronary artery disease involving native coronary artery of native heart with angina pectoris ([place])  03/10/2018  . Dyspnea   . Encephalopathy, hypertensive   . Essential hypertension 07/07/2014  . Germ cell tumor ([place]) 12/22/2019  . Goals of care, counseling/discussion 12/22/2019  . Hematemesis without nausea   . HFrEF (heart failure with reduced ejection fraction) ([place])   . History of CVA (cerebrovascular accident) 11/23/2018  . History of non-ST elevation myocardial infarction (NSTEMI) 02/21/2019  . Hypertension   . Hypokalemia   . Hyponatremia 06/29/2015  . Ischemic cardiomyopathy 02/2018   a. 02/2018 Echo: EF 35-40% (LV gram on cath was 25-30%), mod, diff HK. mid-apicalanteroseptal, ant, and apical sev HK. RVH.-->  No notable change on echo from June 2020 and August 2020.  Marland Kitchen Localized swelling, mass or lump of neck   . Long term (current) use of anticoagulants 11/16/2018  . Malnutrition of moderate degree 11/11/2018  . Mass of right side of neck 07/07/2014  . Myocardial infarction ([place])   . Neck mass    Right Neck mass - US neck showed 6.3 cm complex, partially cystic, partially solid mass. CT neck showed 2 cm mass from parotid gland on right, with 3x4x5cm Possible metastatic lymphadenopathy. we recommended following up with Dr. Redmond Baseman ENT for biopsy of this.     . Non-ST elevation (NSTEMI) myocardial infarction ([place]) 03/06/2018  . NSTEMI (non-ST elevated myocardial infarction) ([place]) 02/2018   85% pLAD - DES PCI   . Paresthesia 07/07/2014  . Prostate cancer ([place]) 03/10/2018  . Prostate cancer metastatic to multiple sites [facility]) 11/2018  . Right sided weakness 07/07/2014  . Stroke ([place])   . TIA (transient ischemic attack) 2016  . Warthin's tumor     SURGICAL HISTORY: Past Surgical History:  Procedure Laterality Date  . COLONOSCOPY WITH PROPOFOL N/A 02/27/2020   Procedure: COLONOSCOPY WITH PROPOFOL;  Surgeon: Yetta Flock, MD;  Location: [place];  Service: Gastroenterology;  Laterality: N/A;  . CORONARY STENT INTERVENTION N/A 03/06/2018   Procedure: CORONARY STENT  INTERVENTION;  Surgeon: [name], MD;  Location: [place] CV LAB;  Service: Cardiovascular: Proximal LAD 85% stenosis (and D1): DES PCI with Xience Sierra DES 3.5 mm x 15 mm - 3.9 mm  . ESOPHAGOGASTRODUODENOSCOPY (EGD) WITH PROPOFOL N/A 02/27/2020   Procedure: ESOPHAGOGASTRODUODENOSCOPY (EGD) WITH PROPOFOL;  Surgeon: Yetta Flock, MD;  Location: [facility] ENDOSCOPY;  Service: Gastroenterology;  Laterality: N/A;  . HEMOSTASIS CLIP PLACEMENT  02/27/2020   Procedure: HEMOSTASIS CLIP PLACEMENT;  Surgeon: Yetta Flock, MD;  Location: [place] ENDOSCOPY;  Service: Gastroenterology;;  . HOT HEMOSTASIS N/A 02/27/2020   Procedure: HOT HEMOSTASIS (ARGON PLASMA COAGULATION/BICAP);  Surgeon: Yetta Flock, MD;  Location: [facility] ENDOSCOPY;  Service: Gastroenterology;  Laterality: N/A;  . INTRAOPERATIVE TRANSTHORACIC ECHOCARDIOGRAM  03/06/2018   (Peri-MI) mild reduced EF 35-40%.  Diffuse hypokinesis (severe HK of the mid-apical anteroseptal, anterior and apical myocardium consistent with LAD infarct).  Unable to assess diastolic function.  Paradoxical septal motion likely related to his LBBB.  RV hypertrophy noted.  Trivial pericardial effusion noted.   Marland Kitchen LEFT HEART CATH AND CORONARY ANGIOGRAPHY N/A 03/06/2018   Procedure: LEFT HEART CATH AND CORONARY ANGIOGRAPHY;  Surgeon: [name], MD;  Location:  [place] INVASIVE CV LAB;  Service: Cardiovascular: Proximal LAD 85% involving D1 45%.  Ostial RI 55%.  Severe LV dysfunction EF 25 to 30%.  1+ MR.  Only minimally elevated LVEDP.  Marland Kitchen ORCHIECTOMY Right 01/17/2020   Procedure: ORCHIECTOMY;  Surgeon: Abbie Sons, MD;  Location: [facility] ORS;  Service: Urology;  Laterality: Right;  . POLYPECTOMY  02/27/2020   Procedure: POLYPECTOMY;  Surgeon: Yetta Flock, MD;  Location: [facility] ENDOSCOPY;  Service: Gastroenterology;;  . PROSTATE BIOPSY N/A 01/17/2020   Procedure: BIOPSY TRANSRECTAL ULTRASONIC PROSTATE (TUBP);  Surgeon: Abbie Sons, MD;  Location:  [facility] ORS;  Service: Urology;  Laterality: N/A;  . TRANSTHORACIC ECHOCARDIOGRAM  10/2018   (Most recent echo, to evaluate for TIA/CVA) : Moderate reduced EF 35 to 40%.  Moderate concentric LVH.  Mild dyskinesis of the apex and severe hypokinesis of mid apical anteroseptal and anterior wall.  Unable to assess RV pressures.  Aortic sclerosis with no stenosis.  No significant change seen    SOCIAL HISTORY: Social History   Socioeconomic History  . Marital status: Legally Separated    Spouse name: Dorothenia  . Number of children: Not on file  . Years of education: Not on file  . Highest education level: Not on file  Occupational History  . Occupation: cook  Tobacco Use  . Smoking status: Current Some Day Smoker    Packs/day: 0.00    Years: 0.00    Pack years: 0.00    Types: Cigarettes  . Smokeless tobacco: Never Used  . Tobacco comment: quit three days ago  Vaping Use  . Vaping Use: Never used  Substance and Sexual Activity  . Alcohol use: Yes    Alcohol/week: 2.0 standard drinks    Types: 2 Cans of beer per week    Comment: 2 Beers daily.  . Drug use: Yes    Frequency: 3.0 times per week    Types: Marijuana  . Sexual activity: Not Currently  Other Topics Concern  . Not on file  Social History Narrative   Currently not working.  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  Now has cancer.   Social Determinants of Health   Financial Resource Strain: Not on file  Food Insecurity: Not on file  Transportation Needs: Not on file  Physical Activity: Not on file  Stress: Not on file  Social Connections: Not on file  Intimate Partner Violence: Not on file    FAMILY HISTORY: Family History  Problem Relation Age of Onset  . Stroke Mother   . Hypertension Mother   . Diabetes type II Mother   . Leukemia Other   . Breast cancer Other   . Stroke Brother   . CAD Neg Hx     ALLERGIES:  has No Known Allergies.  MEDICATIONS:  Current Outpatient  Medications  Medication Sig Dispense Refill  . albuterol (VENTOLIN HFA) 108 (90 Base) MCG/ACT inhaler Inhale 2 puffs into the lungs every 6 (six) hours as needed for wheezing or shortness of breath. 8 g 11  . bicalutamide (CASODEX) 50 MG tablet Take 1 tablet by mouth twice daily (Patient taking differently: Take 50 mg by mouth 2 (two) times daily.) 60 tablet 0  . clopidogrel (PLAVIX) 75 MG tablet Take 1 tablet (75 mg total) by mouth daily.    Marland Kitchen ezetimibe (ZETIA) 10 MG tablet Take 1 tablet (10 mg total) by mouth daily. 90 tablet 3  . furosemide (LASIX)  20 MG tablet May take 20 mg tablet as needed for increase shortness of breath or swelling (Patient not taking: Reported on 05/23/2020) 20 tablet 6  . metoprolol succinate (TOPROL XL) 25 MG 24 hr tablet Take 1 tablet (25 mg total) by mouth daily. 90 tablet 3  . nitroGLYCERIN (NITROSTAT) 0.4 MG SL tablet Place 1 tablet (0.4 mg total) under the tongue every 5 (five) minutes as needed for chest pain. 25 tablet 3  . ondansetron (ZOFRAN) 4 MG tablet Take 4 mg by mouth every 4 (four) hours as needed for nausea or vomiting.     . rosuvastatin (CRESTOR) 40 MG tablet Take 1 tablet by mouth once daily (Patient not taking: No sig reported) 30 tablet 1  . sacubitril-valsartan (ENTRESTO) 49-51 MG Take 1 tablet by mouth 2 (two) times daily. (Patient not taking: No sig reported) 60 tablet 6  . spironolactone (ALDACTONE) 25 MG tablet Take 1 tablet (25 mg total) by mouth daily. 90 tablet 3  . tamsulosin (FLOMAX) 0.4 MG CAPS capsule Take 1 capsule (0.4 mg total) by mouth daily after supper. (Patient taking differently: Take 0.4 mg by mouth every 12 (twelve) hours.) 90 capsule 3  . traZODone (DESYREL) 50 MG tablet Take 0.5-1 tablets (25-50 mg total) by mouth at bedtime as needed for sleep. (Patient not taking: No sig reported) 30 tablet 6  . venlafaxine XR (EFFEXOR XR) 37.5 MG 24 hr capsule Take 1 capsule (37.5 mg total) by mouth daily with breakfast. (Patient not taking:  Reported on 05/23/2020) 30 capsule 1   No current facility-administered medications for this visit.     PHYSICAL EXAMINATION: ECOG PERFORMANCE STATUS: 1 - Symptomatic but completely ambulatory Vitals:   05/28/20 1030  BP: (!) 154/87  Pulse: 91  Resp: 18  Temp: 98 F (36.7 C)   Filed Weights   05/28/20 1030  Weight: 151 lb 11.2 oz (68.8 kg)    Physical Exam Constitutional:      General: He is not in acute distress.    Comments: Thin built.   HENT:     Head: Normocephalic and atraumatic.  Eyes:     General: No scleral icterus. Neck:     Comments: Palpable right cervical mass Cardiovascular:     Rate and Rhythm: Normal rate and regular rhythm.     Heart sounds: Murmur heard.    Pulmonary:     Effort: Pulmonary effort is normal. No respiratory distress.     Breath sounds: No wheezing.  Abdominal:     General: Bowel sounds are normal. There is no distension.     Palpations: Abdomen is soft.  Musculoskeletal:        General: No deformity. Normal range of motion.     Cervical back: Normal range of motion and neck supple.  Skin:    General: Skin is warm and dry.     Findings: No erythema or rash.  Neurological:     Mental Status: He is alert and oriented to person, place, and time. Mental status is at baseline.     Cranial Nerves: No cranial nerve deficit.     Coordination: Coordination normal.  Psychiatric:        Mood and Affect: Mood normal.     LABORATORY DATA:  I have reviewed the data as listed Lab Results  Component Value Date   WBC 3.3 (L) 05/10/2020   HGB 10.9 (L) 05/10/2020   HCT 32.5 (L) 05/10/2020   MCV 74 (L) 05/10/2020   PLT 306  05/10/2020   Recent Labs    06/07/19 1253 07/26/19 1450 07/26/19 1450 11/01/19 1021 12/15/19 0920 02/10/20 1000 02/22/20 2137 02/25/20 0326 02/27/20 0411 02/28/20 0302 05/10/20 1043  NA  --  134*   < > 138 138 132*   < > 134* 137 138 136  K  --  4.5   < > 4.3 3.8 4.3   < > 3.7 3.5 3.4* 4.1  CL  --  99   <  > 102 100 96*   < > 97* 102 99 101  CO2  --  23   < > 25 27 24    < > 27 25 27 23   GLUCOSE  --  111*   < > 107* 104* 126*   < > 106* 89 103* 102*  BUN  --  14   < > 11 8 14    < > 9 5* 7 5*  CREATININE  --  0.74   < > 0.78 0.70 0.92   < > 0.79 0.74 0.77 0.73*  CALCIUM  --  9.5   < > 9.2 9.5 9.1   < > 9.3 9.7 9.8 10.2  GFRNONAA  --  >60   < > >60 >60 >60   < > >60 >60 >60  --   GFRAA  --  >60  --  >60  --   --   --   --   --   --   --   PROT 7.0  --   --  7.6 7.6 7.1  --   --   --   --   --   ALBUMIN 4.2  --   --  3.8 3.7 3.7  --   --   --   --   --   AST 91*  --   --  44* 67* 64*  --   --   --   --   --   ALT 69*  --   --  31 54* 39  --   --   --   --   --   ALKPHOS 103  --   --  78 90 53  --   --   --   --   --   BILITOT 0.2  --   --  0.5 0.6 0.6  --   --   --   --   --   BILIDIR 0.10  --   --   --   --   --   --   --   --   --   --    < > = values in this interval not displayed.   Iron/TIBC/Ferritin/ %Sat No results found for: IRON, TIBC, FERRITIN, IRONPCTSAT    RADIOGRAPHIC STUDIES: I have personally reviewed the radiological images as listed and agreed with the findings in the report. No results found.    ASSESSMENT & PLAN:  1. Prostate cancer ([place])   2. Germ cell tumor ([place])   3. 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 He missed his appointment with urology for Eligard.  Discussed with Dr. [name]. Patient left without having blood work done today.  We will schedule patient to Have blood  work done CBC, CMP, PSA and proceed with Eligard treatment at cancer center. Scheduler has had difficulties reaching him after the clinic today.  Will reach out again.  Hold off adding  additional chemotherapy or oral agent at this point, due to his chronic heart condition.  #Germ cell tumor, stage II  03/14/20 PET was reviewed and discussed with patient. - small hypermetabolic lymph nodes along the RIGHT iliac vessels is most consistent with nodal metastasis. Status post radiation.  Continue monitor.  Baseline AFP, hCG, LDH were normal.   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  Patient has an ENT evaluation.  Status post biopsy- WARTHIN TUMOR.  Negative for atypia and malignancy.  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  Not hypermetabolic on PET scan.   All questions were answered. The patient knows to call the clinic with any problems questions or concerns.  Return of visit: 3 months.  Earlie Server, MD, PhD Hematology Oncology [facility] at [facility] Pager- [phone] 05/28/2020

## 2020-05-29 ENCOUNTER — Other Ambulatory Visit: Payer: Self-pay

## 2020-05-29 DIAGNOSIS — C61 Malignant neoplasm of prostate: Secondary | ICD-10-CM

## 2020-05-31 ENCOUNTER — Ambulatory Visit: Payer: Medicaid Other

## 2020-05-31 ENCOUNTER — Other Ambulatory Visit: Payer: Medicaid Other

## 2020-06-01 ENCOUNTER — Inpatient Hospital Stay: Payer: Medicaid Other

## 2020-06-01 ENCOUNTER — Other Ambulatory Visit (HOSPITAL_COMMUNITY): Payer: Self-pay

## 2020-06-01 DIAGNOSIS — C61 Malignant neoplasm of prostate: Secondary | ICD-10-CM

## 2020-06-01 LAB — COMPREHENSIVE METABOLIC PANEL
ALT: 12 U/L (ref 0–44)
AST: 21 U/L (ref 15–41)
Albumin: 3.8 g/dL (ref 3.5–5.0)
Alkaline Phosphatase: 61 U/L (ref 38–126)
Anion gap: 11 (ref 5–15)
BUN: 8 mg/dL (ref 6–20)
CO2: 21 mmol/L — ABNORMAL LOW (ref 22–32)
Calcium: 9.3 mg/dL (ref 8.9–10.3)
Chloride: 100 mmol/L (ref 98–111)
Creatinine, Ser: 0.84 mg/dL (ref 0.61–1.24)
GFR, Estimated: >60 mL/min (ref >60)
Glucose, Bld: 83 mg/dL (ref 70–99)
Potassium: 3.7 mmol/L (ref 3.5–5.1)
Sodium: 132 mmol/L — ABNORMAL LOW (ref 135–145)
Total Bilirubin: 0.3 mg/dL (ref 0.3–1.2)
Total Protein: 7.5 g/dL (ref 6.5–8.1)

## 2020-06-01 LAB — CBC WITH DIFFERENTIAL/PLATELET
Abs Immature Granulocytes: 0.01 10*3/uL (ref 0.00–0.07)
Basophils Absolute: 0 10*3/uL (ref 0.0–0.1)
Basophils Relative: 1 %
Eosinophils Absolute: 0 10*3/uL (ref 0.0–0.5)
Eosinophils Relative: 1 %
HCT: 31.7 % — ABNORMAL LOW (ref 39.0–52.0)
Hemoglobin: 10.5 g/dL — ABNORMAL LOW (ref 13.0–17.0)
Immature Granulocytes: 0 %
Lymphocytes Relative: 22 %
Lymphs Abs: 0.6 10*3/uL — ABNORMAL LOW (ref 0.7–4.0)
MCH: 24.7 pg — ABNORMAL LOW (ref 26.0–34.0)
MCHC: 33.1 g/dL (ref 30.0–36.0)
MCV: 74.6 fL — ABNORMAL LOW (ref 80.0–100.0)
Monocytes Absolute: 0.5 10*3/uL (ref 0.1–1.0)
Monocytes Relative: 18 %
Neutro Abs: 1.5 10*3/uL — ABNORMAL LOW (ref 1.7–7.7)
Neutrophils Relative %: 58 %
Platelets: 331 10*3/uL (ref 150–400)
RBC: 4.25 MIL/uL (ref 4.22–5.81)
RDW: 20.1 % — ABNORMAL HIGH (ref 11.5–15.5)
WBC: 2.6 10*3/uL — ABNORMAL LOW (ref 4.0–10.5)
nRBC: 0 % (ref 0.0–0.2)

## 2020-06-01 LAB — PSA: Prostatic Specific Antigen: 0.67 ng/mL (ref 0.00–4.00)

## 2020-06-01 MED ORDER — LEUPROLIDE ACETATE (6 MONTH) 45 MG ~~LOC~~ KIT
45.0000 mg | PACK | Freq: Once | SUBCUTANEOUS | Status: AC
  Filled 2020-06-01: qty 45

## 2020-06-04 ENCOUNTER — Other Ambulatory Visit (HOSPITAL_COMMUNITY)
Admission: RE | Admit: 2020-06-04 | Discharge: 2020-06-04 | Disposition: A | Discharge status: Home / Self Care | Payer: Medicaid Other | Source: Ambulatory Visit | Attending: Cardiology | Admitting: Cardiology

## 2020-06-04 DIAGNOSIS — Z01812 Encounter for preprocedural laboratory examination: Secondary | ICD-10-CM | POA: Diagnosis not present

## 2020-06-04 DIAGNOSIS — Z20822 Contact with and (suspected) exposure to covid-19: Secondary | ICD-10-CM | POA: Diagnosis not present

## 2020-06-04 LAB — SARS CORONAVIRUS 2 (TAT 6-24 HRS): SARS Coronavirus 2: NEGATIVE

## 2020-06-05 NOTE — Pre-Procedure Instructions (Signed)
Attempted to call patient regarding procedure instructions for tomorrow.  No answer. 

## 2020-06-06 ENCOUNTER — Other Ambulatory Visit: Payer: Self-pay

## 2020-06-06 ENCOUNTER — Ambulatory Visit (HOSPITAL_COMMUNITY)
Admission: RE | Admit: 2020-06-06 | Discharge: 2020-06-06 | Disposition: A | Discharge status: Home / Self Care | Payer: Medicaid Other | Attending: Cardiology | Admitting: Cardiology

## 2020-06-06 ENCOUNTER — Encounter (HOSPITAL_COMMUNITY)
Admission: RE | Disposition: A | Discharge status: Home / Self Care | Payer: Medicaid Other | Source: Home / Self Care | Attending: Cardiology

## 2020-06-06 ENCOUNTER — Ambulatory Visit (HOSPITAL_COMMUNITY): Payer: Medicaid Other

## 2020-06-06 DIAGNOSIS — Z7902 Long term (current) use of antithrombotics/antiplatelets: Secondary | ICD-10-CM | POA: Diagnosis not present

## 2020-06-06 DIAGNOSIS — I110 Hypertensive heart disease with heart failure: Secondary | ICD-10-CM | POA: Insufficient documentation

## 2020-06-06 DIAGNOSIS — I447 Left bundle-branch block, unspecified: Secondary | ICD-10-CM | POA: Insufficient documentation

## 2020-06-06 DIAGNOSIS — I5022 Chronic systolic (congestive) heart failure: Secondary | ICD-10-CM

## 2020-06-06 DIAGNOSIS — E785 Hyperlipidemia, unspecified: Secondary | ICD-10-CM | POA: Insufficient documentation

## 2020-06-06 DIAGNOSIS — Z955 Presence of coronary angioplasty implant and graft: Secondary | ICD-10-CM | POA: Insufficient documentation

## 2020-06-06 DIAGNOSIS — I255 Ischemic cardiomyopathy: Secondary | ICD-10-CM | POA: Diagnosis present

## 2020-06-06 DIAGNOSIS — Z79899 Other long term (current) drug therapy: Secondary | ICD-10-CM | POA: Insufficient documentation

## 2020-06-06 DIAGNOSIS — I2510 Atherosclerotic heart disease of native coronary artery without angina pectoris: Secondary | ICD-10-CM | POA: Insufficient documentation

## 2020-06-06 DIAGNOSIS — F17210 Nicotine dependence, cigarettes, uncomplicated: Secondary | ICD-10-CM | POA: Insufficient documentation

## 2020-06-06 DIAGNOSIS — Z95818 Presence of other cardiac implants and grafts: Secondary | ICD-10-CM

## 2020-06-06 DIAGNOSIS — I428 Other cardiomyopathies: Secondary | ICD-10-CM

## 2020-06-06 HISTORY — PX: BIV ICD INSERTION CRT-D: EP1195

## 2020-06-06 SURGERY — BIV ICD INSERTION CRT-D

## 2020-06-06 MED ORDER — MIDAZOLAM HCL 5 MG/5ML IJ SOLN
INTRAMUSCULAR | Status: DC | PRN
  Administered 2020-06-06 (×3): 1 mg via INTRAVENOUS

## 2020-06-06 MED ORDER — FENTANYL CITRATE (PF) 100 MCG/2ML IJ SOLN
INTRAMUSCULAR | Status: AC
  Filled 2020-06-06: qty 2

## 2020-06-06 MED ORDER — HEPARIN (PORCINE) IN NACL 1000-0.9 UT/500ML-% IV SOLN
INTRAVENOUS | Status: DC | PRN
  Administered 2020-06-06: 500 mL

## 2020-06-06 MED ORDER — IOHEXOL 350 MG/ML SOLN
INTRAVENOUS | Status: DC | PRN
  Administered 2020-06-06: 5 mL

## 2020-06-06 MED ORDER — MIDAZOLAM HCL 5 MG/5ML IJ SOLN
INTRAMUSCULAR | Status: AC
  Filled 2020-06-06: qty 5

## 2020-06-06 MED ORDER — SODIUM CHLORIDE 0.9 % IV SOLN: INTRAVENOUS | Status: DC

## 2020-06-06 MED ORDER — CEFAZOLIN SODIUM-DEXTROSE 1-4 GM/50ML-% IV SOLN: 1.0000 g | Freq: Four times a day (QID) | INTRAVENOUS | Status: DC

## 2020-06-06 MED ORDER — LIDOCAINE HCL (PF) 1 % IJ SOLN
INTRAMUSCULAR | Status: DC | PRN
  Administered 2020-06-06: 70 mL

## 2020-06-06 MED ORDER — SODIUM CHLORIDE 0.9 % IV SOLN
INTRAVENOUS | Status: AC
  Filled 2020-06-06: qty 2

## 2020-06-06 MED ORDER — CEFAZOLIN SODIUM-DEXTROSE 2-4 GM/100ML-% IV SOLN
2.0000 g | INTRAVENOUS | Status: AC
  Administered 2020-06-06: 2 g via INTRAVENOUS

## 2020-06-06 MED ORDER — LIDOCAINE HCL 1 % IJ SOLN
INTRAMUSCULAR | Status: AC
  Filled 2020-06-06: qty 60

## 2020-06-06 MED ORDER — CEFAZOLIN SODIUM-DEXTROSE 2-4 GM/100ML-% IV SOLN
INTRAVENOUS | Status: AC
  Filled 2020-06-06: qty 100

## 2020-06-06 MED ORDER — ONDANSETRON HCL 4 MG/2ML IJ SOLN: 4.0000 mg | Freq: Four times a day (QID) | INTRAMUSCULAR | Status: DC | PRN

## 2020-06-06 MED ORDER — HEPARIN (PORCINE) IN NACL 1000-0.9 UT/500ML-% IV SOLN
INTRAVENOUS | Status: AC
  Filled 2020-06-06: qty 500

## 2020-06-06 MED ORDER — ACETAMINOPHEN 325 MG PO TABS
325.0000 mg | ORAL_TABLET | ORAL | Status: DC | PRN
  Filled 2020-06-06: qty 2

## 2020-06-06 MED ORDER — SODIUM CHLORIDE 0.9 % IV SOLN
80.0000 mg | INTRAVENOUS | Status: AC
  Administered 2020-06-06: 80 mg

## 2020-06-06 MED ORDER — CHLORHEXIDINE GLUCONATE 4 % EX LIQD
4.0000 "application " | Freq: Once | CUTANEOUS | Status: DC
  Filled 2020-06-06: qty 60

## 2020-06-06 MED ORDER — FENTANYL CITRATE (PF) 100 MCG/2ML IJ SOLN
INTRAMUSCULAR | Status: DC | PRN
  Administered 2020-06-06 (×3): 25 ug via INTRAVENOUS

## 2020-06-06 SURGICAL SUPPLY — 18 items
BALLN COR SINUS VENO 6FR 80 (BALLOONS) ×2
BALLOON COR SINUS VENO 6FR 80 (BALLOONS) ×1 IMPLANT
CABLE SURGICAL S-101-97-12 (CABLE) ×2 IMPLANT
CATH CPS DIRECT 135 DS2C020 (CATHETERS) ×2 IMPLANT
CPS IMPLANT KIT 410190 (MISCELLANEOUS) ×2 IMPLANT
ICD GALLANT HFCRTD CDHFA500Q (ICD Generator) ×2 IMPLANT
LEAD DURATA 7122Q-65CM (Lead) ×2 IMPLANT
LEAD QUARTET 1456Q-86 (Lead) ×1 IMPLANT
LEAD QUARTET 1458Q-86CM (Lead) ×2 IMPLANT
LEAD TENDRIL MRI 52CM LPA1200M (Lead) ×2 IMPLANT
PAD PRO RADIOLUCENT 2001M-C (PAD) ×2 IMPLANT
QUARTET 1456Q-86 (Lead) ×2 IMPLANT
SHEATH 7FR PRELUDE SNAP 13 (SHEATH) ×2 IMPLANT
SHEATH 8FR PRELUDE SNAP 13 (SHEATH) ×2 IMPLANT
SHEATH PROBE COVER 6X72 (BAG) ×2 IMPLANT
TRAY PACEMAKER INSERTION (PACKS) ×2 IMPLANT
WIRE ACUITY WHISPER EDS 4648 (WIRE) ×2 IMPLANT
WIRE HI TORQ VERSACORE-J 145CM (WIRE) ×2 IMPLANT

## 2020-06-06 NOTE — Progress Notes (Signed)
Renee, PA in and gave client discharge instructions

## 2020-06-06 NOTE — Discharge Instructions (Signed)
Cardioverter Defibrillator Implantation, Care After The following information offers guidance on how to care for yourself after your procedure. Your health care provider may also give you more specific instructions. If you have problems or questions, contact your health care provider. What can I expect after the procedure? After the procedure, it is common to have:  Some pain. Pain may last a few days.  A slight bump over the skin where the device was placed. Sometimes, it is possible to feel the device under the skin. This is normal. During the months and years after your procedure, your health care provider will check the device, the wires (leads), and the battery every few months. The generator is monitored over time and will be replaced when the battery is depleted. Follow these instructions at home: Medicines  Take over-the-counter and prescription medicines only as told by your health care provider.  If you were prescribed an antibiotic medicine, take it as told by your health care provider. Do not stop taking the antibiotic even if you start to feel better. Bathing  Do not take baths, swim, or use a hot tub until your health care provider approves.  Ask your health care provider when you may take showers. You may only be allowed to take sponge baths. Cover your incision with a watertight covering if you take a sponge bath. Incision care  Follow instructions from your health care provider about how to take care of your incision. Make sure you: ? Wash your hands with soap and water for at least 20 seconds before and after you change your bandage (dressing). If soap and water are not available, use hand sanitizer. ? Change your dressing as told by your health care provider. ? Leave stitches (sutures), skin glue, adhesive strips, or staples in place. These skin closures may need to stay in place for 2 weeks or longer. If adhesive strip edges start to loosen and curl up, you may trim the  loose edges. Do not remove adhesive strips completely unless your health care provider tells you to do that.  Check your incision area every day for signs of infection. Check for: ? Redness, swelling, or more pain. ? Fluid or blood. ? Warmth. ? Pus or a bad smell.  Do not use lotions or ointments near the incision area unless told by your health care provider.  Do not wear tight clothes or clothes that could rub on your incision.   Activity  Do not lift anything that is heavier than 10 lb (4.5 kg), or the limit that you are told, until your health care provider says that it is safe.  Return to your normal activities as told by your health care provider. Ask your health care provider what activities are safe for you. Follow instructions from your health care provider about sports, exercise, work, and sexual activity restrictions after your procedure.  Avoid sitting for a long time without moving. Get up to take short walks every 1-2 hours. This is important to improve blood flow and breathing. Ask for help if you feel weak or unsteady.  For at least 6 weeks: ? Do not lift your upper arm above your shoulders. You may be given a sling to use. This means no tennis, golf, or swimming for this period of time. ? If you tend to sleep with your arm above your head, use a restraint, such as a sling, to prevent movement during sleep. ? You should use your shoulder on the side of the defibrillator  in daily tasks that do not require a lot of motion. ? Avoid sudden jerking, pulling, or chopping movements that pull your upper arm far away from your body.  Do not drive or operate machinery until your health care provider says that it is safe.  Participate in a cardiac rehabilitation program as told by your health care provider. A cardiac rehabilitation program is a treatment program to improve your health and well-being through exercise training, education, and counseling.   Electric and magnetic  fields  Tell all health care providers, including your dentist, that you have a defibrillator. Some medical devices and imaging methods such as MRI may affect your device.  If you must pass through a metal detector, quickly walk through it. Do not stop under the detector. Do not stand near it.  Avoid places or objects that have a strong electric or magnetic field, including: ? Airport Herbalist. At the airport, let officials know that you have a defibrillator. Your defibrillator ID card will let you be checked in a way that is safe for you and will not damage your defibrillator. Do not let a security person wave a magnetic wand near your defibrillator. That can make it stop working. ? Power plants. ? Large electrical generators. ? Anti-theft systems or electronic article surveillance (EAS). ? Radiofrequency transmission towers, such as mobile phone and radio towers.  Some household devices and appliances may produce electromagnetic waves that affect your defibrillator. If you are not sure if something is safe to use, ask your health care provider. ? When using your mobile phone, hold it to the ear that is on the opposite side from the defibrillator. Do not leave your mobile phone in a pocket over the defibrillator. ? Many devices contain magnets. Examples include headphones and electronic cigarettes. Do not put them in a pocket near your device. If they are wearable, wear them as far away from the device as possible. ? Some devices are safe to use if they are held at least 12 inches (30 cm) away from your defibrillator. These include power tools, chain saws, lawn mowers, and speakers. ? You may safely use electric blankets, heating pads, computers, and microwave ovens. ? Do not use amateur or ham radio equipment, electric (arc) welding torches, magnetic mattresses or chairs, and body fat measuring scales. General instructions  Always keep your defibrillator ID card with you. The card should  list the implant date, device model, and manufacturer. Consider wearing a medical alert bracelet or necklace.  Have your defibrillator checked as often as told by your health care provider. Most defibrillators last for 5-7 years.  Do not use any products that contain nicotine or tobacco. These products include cigarettes, chewing tobacco, and vaping devices, such as e-cigarettes. If you need help quitting, ask your health care provider.  Discuss specific device restrictions with your device manufacturer or health care provider.  Get screened for depression and anxiety, and seek treatment if needed.  Keep all follow-up visits. This is important. Contact a health care provider if:  You feel one shock in your chest.  You gain weight suddenly.  You have a fever.  You have swelling in your arm on the same side of your body as the device or have swelling in your legs or feet.  It feels like your heart is fluttering or skipping beats (heart palpitations).  You have any of these signs of infection around your incision: ? Redness, swelling, or more pain. ? Fluid or blood. ?  Warmth. ? Pus or a bad smell.  You are feeling depressed, scared, anxious, or sad. Get help right away if:  You have chest pain.  You feel more than one shock.  You have problems breathing or have shortness of breath.  You have dizziness or fainting.  Your incision starts to open up. These symptoms may represent a serious problem that is an emergency. Do not wait to see if the symptoms will go away. Get medical help right away. Call your local emergency services (911 in the [place] Do not drive yourself to the hospital. Summary  It is important to follow your activity restrictions for at least 6 weeks following your device implantation. Do not lift your upper arm above your shoulder.  Always keep your defibrillator ID card with you. The card should list the implant date, device model, and manufacturer. Consider  wearing a medical alert bracelet or necklace.  Follow the device distance restrictions for electric and magnetic fields.  Keep all follow-up visits with your health care provider for monitoring of your defibrillator.  Contact your health care provider if you have signs of an infection such as a fever or redness, swelling, or more pain around your incision. Get help right away if you have chest pain, receive more than one shock, or faint. This information is not intended to replace advice given to you by your health care provider. Make sure you discuss any questions you have with your health care provider. Document Revised: 08/24/2019 Document Reviewed: 08/24/2019 Elsevier Patient Education  2021 [facility] Discharge Instructions for  Pacemaker/Defibrillator Patients  Tomorrow, 06/07/20, send in a device transmission  Activity No heavy lifting or vigorous activity with your left/right arm for 6 to 8 weeks.  Do not raise your left/right arm above your head for one week.  Gradually raise your affected arm as drawn below.            06/12/20                        06/13/20                      06/14/20                     06/15/20 __  NO DRIVING until your wound check visit  WOUND CARE - Keep the wound area clean and dry.  Do not get this area wet , no showers until cleared to at your wound check visit - Tomorrow, 06/07/20, remove the arm sling - Tomorrow, 06/07/20 remove the outer plastic bandage.  Underneath the plastic bandage there are steri strips (paper tapes), DO NOT remove these. - The tape/steri-strips on your wound will fall off; do not pull them off.  No bandage is needed on the site.  DO  NOT apply any creams, oils, or ointments to the wound area. - If you notice any drainage or discharge from the wound, any swelling or bruising at the site, or you develop a fever > 101? F after you are discharged home, call the office at once.  Special Instructions - You  are still able to use cellular telephones; use the ear opposite the side where you have your pacemaker/defibrillator.  Avoid carrying your cellular phone near your device. - When traveling through airports, show security personnel your identification card to avoid being screened in the metal detectors.  Ask  the security personnel to use the hand wand. - Avoid arc welding equipment, MRI testing (magnetic resonance imaging), TENS units (transcutaneous nerve stimulators).  Call the office for questions about other devices. - Avoid electrical appliances that are in poor condition or are not properly grounded. - Microwave ovens are safe to be near or to operate.  Additional information for defibrillator patients should your device go off: - If your device goes off ONCE and you feel fine afterward, notify the device clinic nurses. - If your device goes off ONCE and you do not feel well afterward, call 911. - If your device goes off TWICE, call 911. - If your device goes off THREE times in one day, call 911.  DO NOT DRIVE YOURSELF OR A FAMILY MEMBER WITH A DEFIBRILLATOR TO THE HOSPITAL--CALL 911.

## 2020-06-06 NOTE — Interval H&P Note (Signed)
History and Physical Interval Note:  06/06/2020 10:44 AM  Daniel Weiss  has presented today for surgery, with the diagnosis of cardiomyopathy.  The various methods of treatment have been discussed with the patient and family. After consideration of risks, benefits and other options for treatment, the patient has consented to  Procedure(s): BIV ICD INSERTION CRT-D (N/A) as a surgical intervention.  The patient's history has been reviewed, patient examined, no change in status, stable for surgery.  I have reviewed the patient's chart and labs.  Questions were answered to the patient's satisfaction.     Daniel Weiss  ICD Criteria  Current LVEF:33%. Within 12 months prior to implant: Yes   Heart failure history: Yes, Class II  Cardiomyopathy history: No.  Atrial Fibrillation/Atrial Flutter: No.  Ventricular tachycardia history: No.  Cardiac arrest history: No.  History of syndromes with risk of sudden death: No.  Previous ICD: No.  Current ICD indication: Primary  PPM indication: No.  Class I or II Bradycardia indication present: No  Beta Blocker therapy for 3 or more months: Yes, prescribed.   Ace Inhibitor/ARB therapy for 3 or more months: Yes, prescribed.    I have seen Daniel Weiss is a 60 y.o. malepre-procedural and has been referred by Ellyn Hack for consideration of ICD implant for primary prevention of sudden death.  The patient's chart has been reviewed and they meet criteria for ICD implant.  I have had a thorough discussion with the patient reviewing options.  The patient and their family (if available) have had opportunities to ask questions and have them answered. The patient and I have decided together through the [employer] to implant ICD at this time.  Risks, benefits, alternatives to ICD implantation were discussed in detail with the patient today. The patient  understands that the risks include but are not limited to bleeding,  infection, pneumothorax, perforation, tamponade, vascular damage, renal failure, MI, stroke, death, inappropriate shocks, and lead dislodgement and wishes to proceed.

## 2020-06-07 ENCOUNTER — Encounter (HOSPITAL_COMMUNITY): Payer: Self-pay | Admitting: Cardiology

## 2020-06-07 DIAGNOSIS — I255 Ischemic cardiomyopathy: Secondary | ICD-10-CM | POA: Diagnosis not present

## 2020-06-08 ENCOUNTER — Inpatient Hospital Stay: Payer: Medicaid Other | Attending: Oncology

## 2020-06-08 ENCOUNTER — Ambulatory Visit
Admission: RE | Admit: 2020-06-08 | Discharge: 2020-06-08 | Disposition: A | Discharge status: Home / Self Care | Payer: Medicaid Other | Source: Ambulatory Visit | Attending: Radiation Oncology | Admitting: Radiation Oncology

## 2020-06-08 ENCOUNTER — Telehealth: Payer: Self-pay | Admitting: *Deleted

## 2020-06-08 ENCOUNTER — Other Ambulatory Visit: Payer: Self-pay

## 2020-06-08 ENCOUNTER — Encounter: Payer: Self-pay | Admitting: Radiation Oncology

## 2020-06-08 VITALS — BP 157/96 | HR 121 | Resp 18 | Wt 152.9 lb

## 2020-06-08 DIAGNOSIS — C61 Malignant neoplasm of prostate: Secondary | ICD-10-CM | POA: Diagnosis not present

## 2020-06-08 DIAGNOSIS — Z79818 Long term (current) use of other agents affecting estrogen receptors and estrogen levels: Secondary | ICD-10-CM | POA: Diagnosis not present

## 2020-06-08 DIAGNOSIS — Z923 Personal history of irradiation: Secondary | ICD-10-CM | POA: Diagnosis not present

## 2020-06-08 DIAGNOSIS — C6211 Malignant neoplasm of descended right testis: Secondary | ICD-10-CM | POA: Insufficient documentation

## 2020-06-08 DIAGNOSIS — C778 Secondary and unspecified malignant neoplasm of lymph nodes of multiple regions: Secondary | ICD-10-CM | POA: Diagnosis present

## 2020-06-08 DIAGNOSIS — C801 Malignant (primary) neoplasm, unspecified: Secondary | ICD-10-CM

## 2020-06-08 MED ORDER — LEUPROLIDE ACETATE (6 MONTH) 45 MG ~~LOC~~ KIT
45.0000 mg | PACK | Freq: Once | SUBCUTANEOUS | Status: AC
Start: 2020-06-08 — End: 2020-06-08
  Administered 2020-06-08: 45 mg via SUBCUTANEOUS
  Filled 2020-06-08: qty 45

## 2020-06-08 NOTE — Telephone Encounter (Signed)
-----   Message from [place], [place] sent at 06/06/2020  5:51 PM EDT ----- This patient does not have all his medicines, but does not know which he has and which he needs. Can you call him and confirm what he has and what he needs refilled   THANKS renee

## 2020-06-08 NOTE — Telephone Encounter (Signed)
Left message to call back  

## 2020-06-08 NOTE — Progress Notes (Signed)
Radiation Oncology Follow up Note  Name: Daniel Weiss   Date:   06/08/2020 MRN:  [id number] DOB: Aug 02, 1960    This 60 y.o. 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  REFERRING PROVIDER: Azzie Glatter, FNP  HPI: Patient is a 60 year old male now at 1 month having completed radiation therapy to his periaortic and right pelvic lymph nodes for stage II seminoma the right testis seen today in routine follow-up he is doing well he specifically denies any abdominal complaints diarrhea or any increased lower urinary tract symptoms.  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  COMPLICATIONS OF TREATMENT: none  FOLLOW UP COMPLIANCE: keeps appointments   PHYSICAL EXAM:  BP (!) 157/96 (BP Location: Right Arm, Patient Position: Sitting)   Pulse (!) 121   Resp 18   Wt 152 lb 14.4 oz (69.4 kg)   BMI 20.74 kg/m  Well-developed well-nourished patient in NAD. HEENT reveals PERLA, EOMI, discs not visualized.  Oral cavity is clear. No oral mucosal lesions are identified. Neck is clear without evidence of cervical or supraclavicular adenopathy. Lungs are clear to A&P. Cardiac examination is essentially unremarkable with regular rate and rhythm without murmur rub or thrill. Abdomen is benign with no organomegaly or masses noted. Motor sensory and DTR levels are equal and symmetric in the upper and lower extremities. Cranial nerves II through XII are grossly intact. Proprioception is intact. No peripheral adenopathy or edema is identified. No motor or sensory levels are noted. Crude visual fields are within normal range.  RADIOLOGY RESULTS: No current films for review  PLAN: Present time patient recovered well from his radiation therapy treatments with minimal side effect profile.  And pleased with his overall progress.  He continues close follow-up care with medical  oncology.  I have asked to see him back in 4 to 5 months for follow-up.  He also does have stage IV prostate cancer.  Patient knows to call with any concerns.  I would like to take this opportunity to thank you for allowing me to participate in the care of your patient.Noreene Filbert, MD

## 2020-06-18 ENCOUNTER — Other Ambulatory Visit: Payer: Self-pay | Admitting: Cardiology

## 2020-06-19 ENCOUNTER — Ambulatory Visit (INDEPENDENT_AMBULATORY_CARE_PROVIDER_SITE_OTHER): Payer: Medicaid Other | Admitting: Emergency Medicine

## 2020-06-19 ENCOUNTER — Other Ambulatory Visit: Payer: Self-pay

## 2020-06-19 DIAGNOSIS — I255 Ischemic cardiomyopathy: Secondary | ICD-10-CM | POA: Diagnosis not present

## 2020-06-19 LAB — CUP PACEART INCLINIC DEVICE CHECK
Battery Remaining Longevity: 55 mo
Brady Statistic RA Percent Paced: 1 %
Brady Statistic RV Percent Paced: 91 %
Date Time Interrogation Session: 20220412125206
HighPow Impedance: 59.625
Implantable Lead Implant Date: 20220330
Implantable Lead Implant Date: 20220330
Implantable Lead Implant Date: 20220330
Implantable Lead Location: 753858
Implantable Lead Location: 753859
Implantable Lead Location: 753860
Implantable Pulse Generator Implant Date: 20220330
Lead Channel Impedance Value: 387.5 Ohm
Lead Channel Impedance Value: 400 Ohm
Lead Channel Impedance Value: 575 Ohm
Lead Channel Pacing Threshold Amplitude: 0.5 V
Lead Channel Pacing Threshold Amplitude: 0.5 V
Lead Channel Pacing Threshold Amplitude: 0.75 V
Lead Channel Pacing Threshold Pulse Width: 0.5 ms
Lead Channel Pacing Threshold Pulse Width: 0.5 ms
Lead Channel Pacing Threshold Pulse Width: 0.5 ms
Lead Channel Sensing Intrinsic Amplitude: 12 mV
Lead Channel Sensing Intrinsic Amplitude: 4.2 mV
Lead Channel Setting Pacing Amplitude: 3.5 V
Lead Channel Setting Pacing Amplitude: 3.5 V
Lead Channel Setting Pacing Amplitude: 3.5 V
Lead Channel Setting Pacing Pulse Width: 0.5 ms
Lead Channel Setting Pacing Pulse Width: 0.5 ms
Lead Channel Setting Sensing Sensitivity: 0.5 mV
Pulse Gen Serial Number: 111039291

## 2020-06-19 NOTE — Progress Notes (Signed)
Wound check appointment. Steri-strips removed. Wound without redness.  some swelling noted and examined by Dr. Curt Bears.   Incision edges approximated, wound well healed. Normal device function. Thresholds, sensing, and impedances consistent with implant measurements. Device programmed at 3.5V for extra safety margin until 3 month visit. Histogram distribution appropriate for patient and level of activity. No mode switches.  13 ventricular arrhythmias noted. EGM reflect SVT, 1:1 conduction.  Patient educated about wound care, arm mobility, lifting restrictions, shock plan. Patient is enrolled in remote monitoring, nect scheduled check 09/06/20.  ROV with Dr. Purvis Kilts / [facility] on 10/01/20.

## 2020-07-08 ENCOUNTER — Other Ambulatory Visit: Payer: Self-pay | Admitting: Urology

## 2020-07-12 IMAGING — CT CT ANGIO CHEST
2 of 9 series · 18 of 36 positions shown · IV contrast (Omnipaque)
Comparison: CTA chest 01/31/2019.

CLINICAL DATA: 58-year-old male with intermittent chest pain for 1
week. Tachycardia and shortness of breath. EMR states ?elevated PSA,
enlarged prostate?.

EXAM:
CT ANGIOGRAPHY CHEST WITH CONTRAST
TECHNIQUE: Multidetector CT imaging of the chest was performed using the
standard protocol during bolus administration of intravenous
contrast. Multiplanar CT image reconstructions and MIPs were
obtained to evaluate the vascular anatomy.
CONTRAST:  100mL OMNIPAQUE IOHEXOL 350 MG/ML SOLN

[Series 7: pe thins · axial · 0.74mm/px · z∈[-338,-44]mm · 17 of 332 slices shown]
[im 19/332  lung]
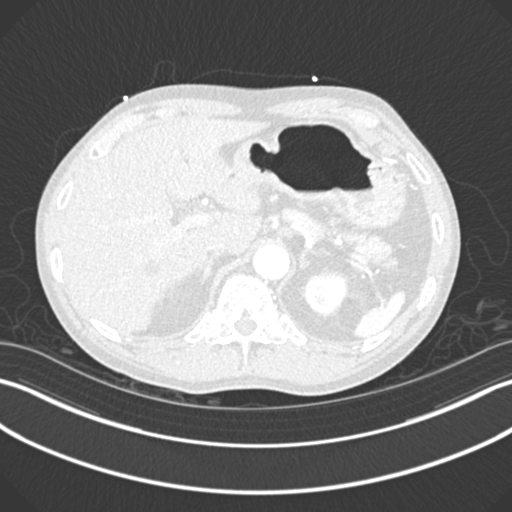
[im 37/332  mediastinal]
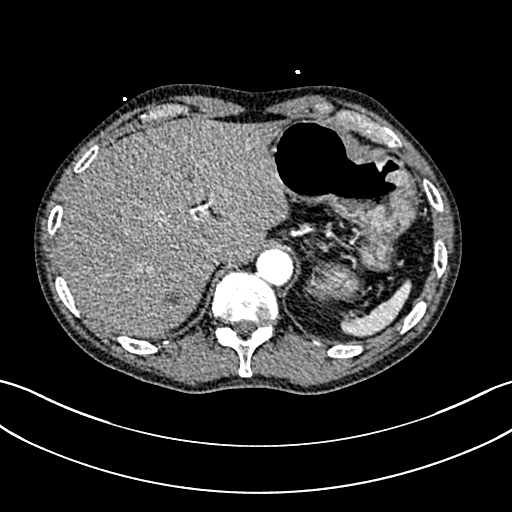
[im 56/332  lung]
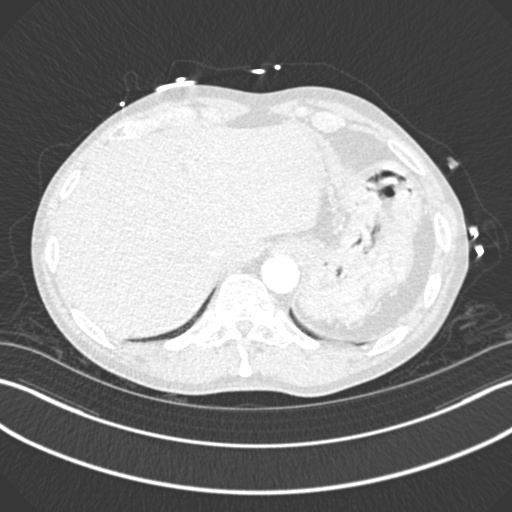
[im 74/332  mediastinal]
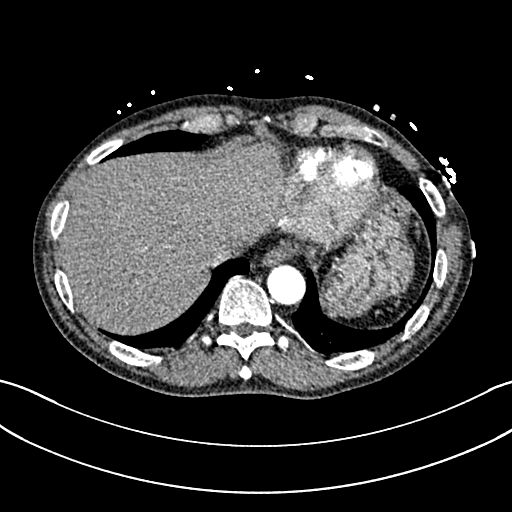
[im 92/332  lung]
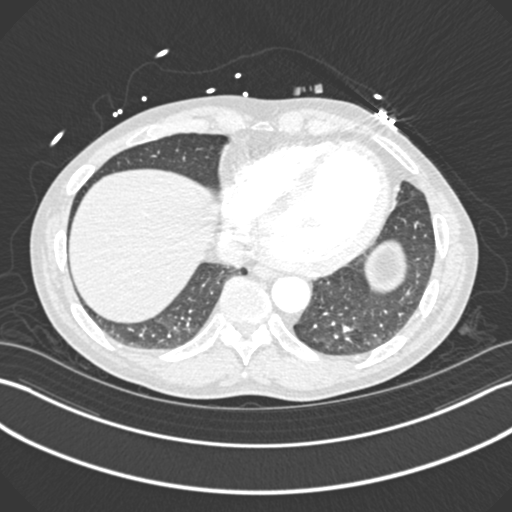
[im 111/332  mediastinal]
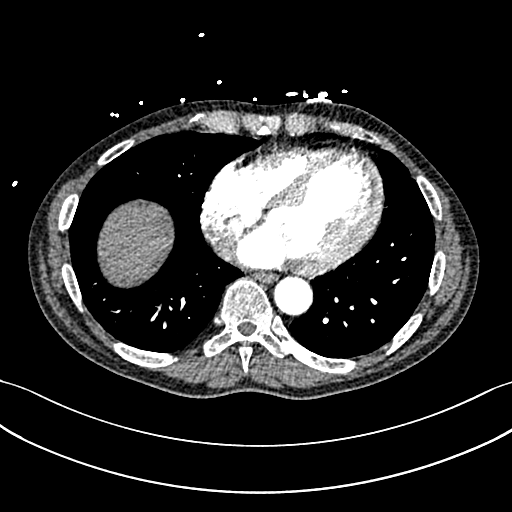
[im 129/332  lung]
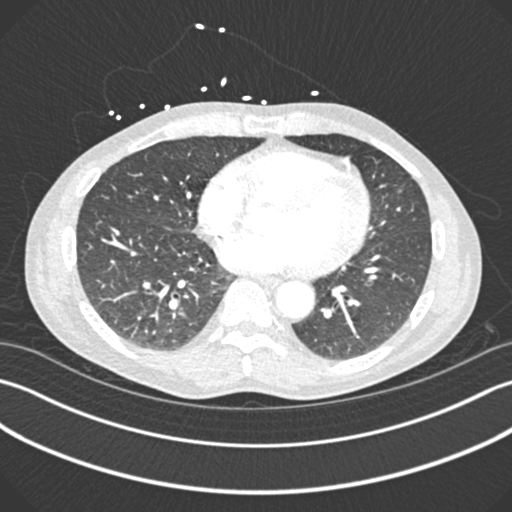
[im 148/332  mediastinal]
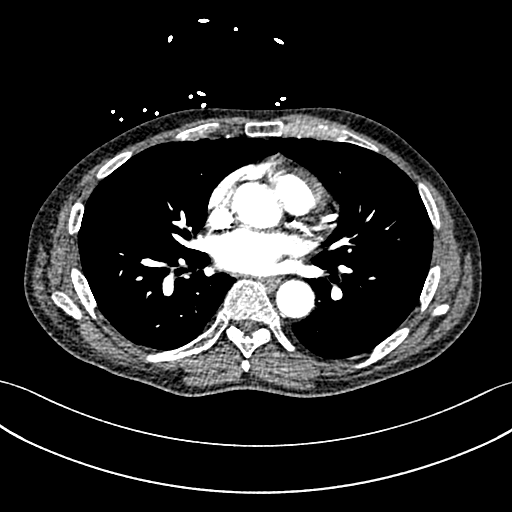
[im 166/332  lung]
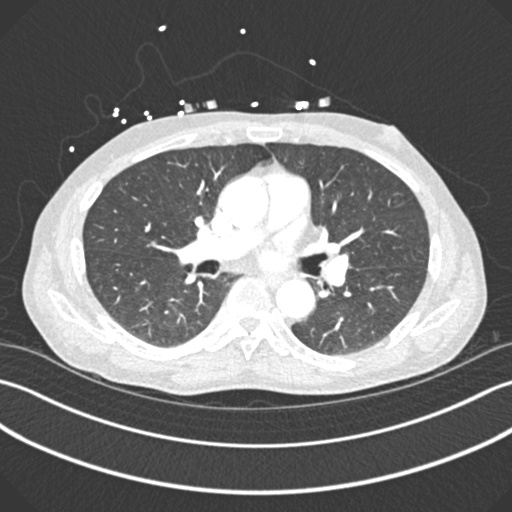
[im 184/332  mediastinal]
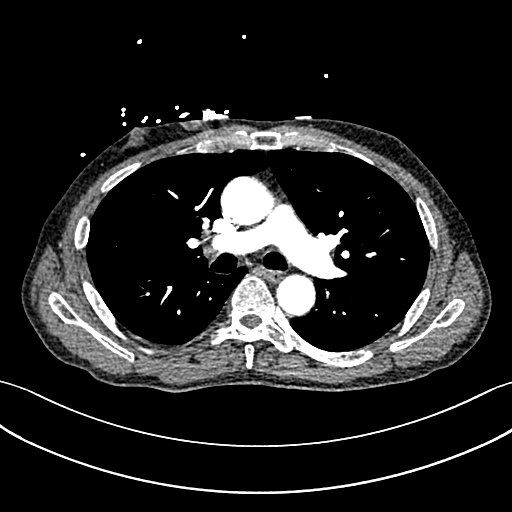
[im 203/332  lung]
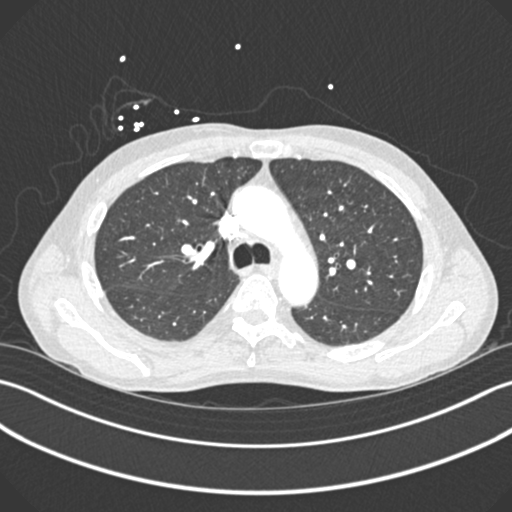
[im 221/332  mediastinal]
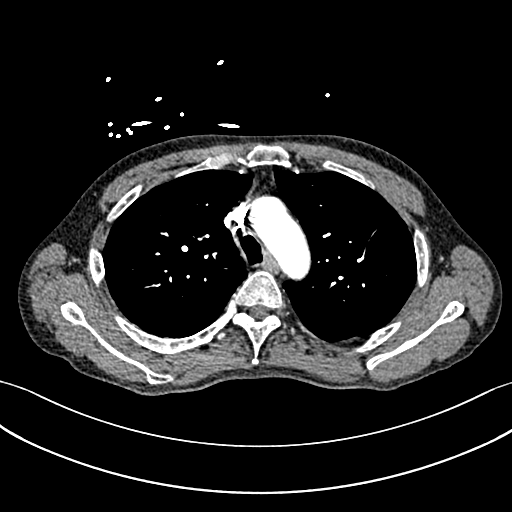
[im 240/332  lung]
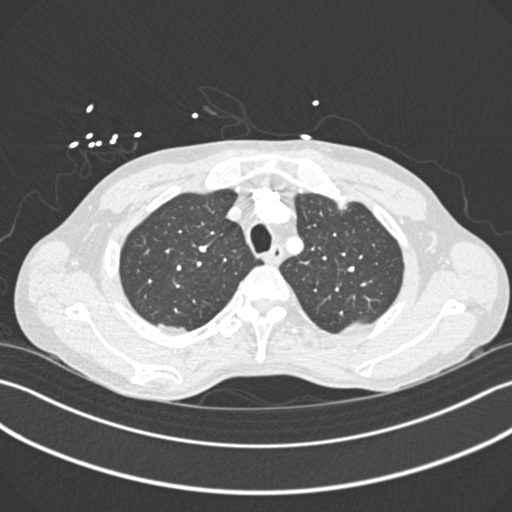
[im 258/332  mediastinal]
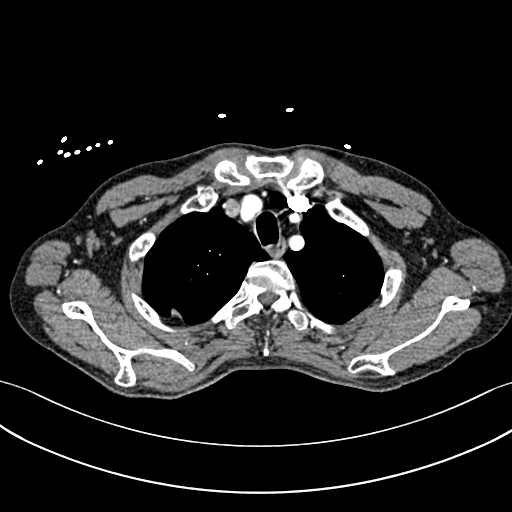
[im 276/332  lung]
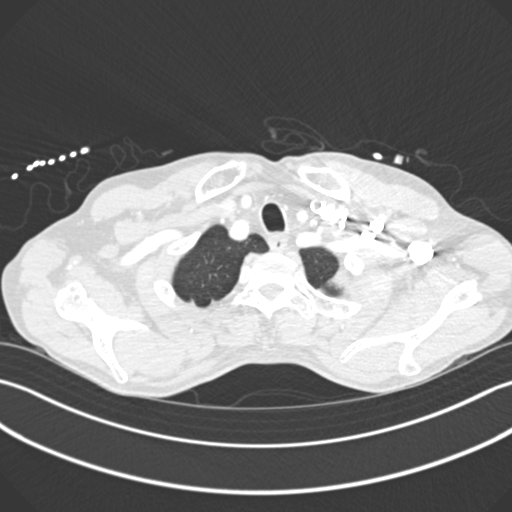
[im 295/332  mediastinal]
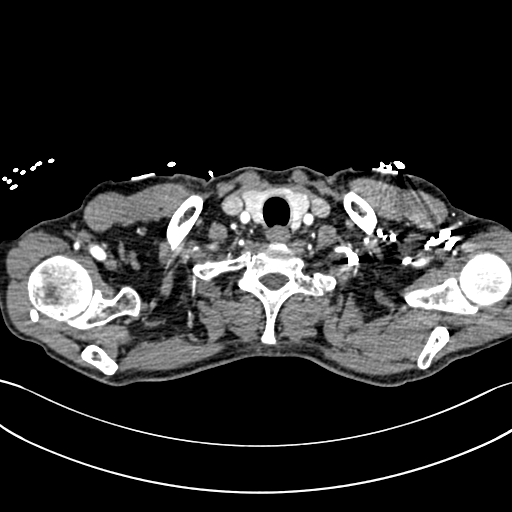
[im 313/332  lung]
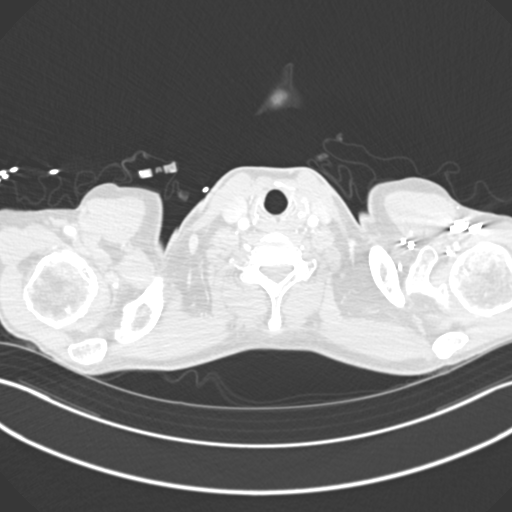

[Series 8: pe coronal mpr · coronal · 0.67mm/px · 1 of 125 slices shown]
[im 63/125  mediastinal]
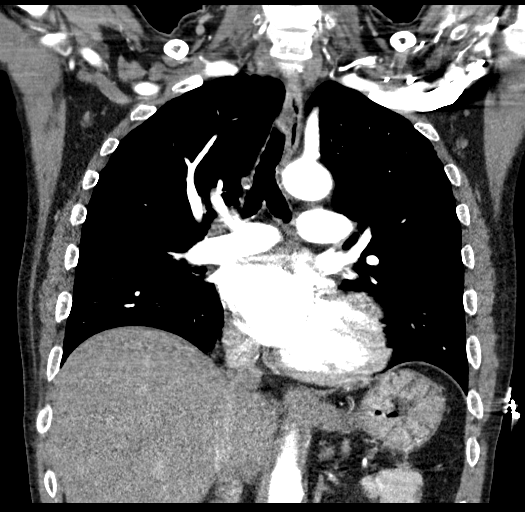

[18 of 36 positions shown; findings below may reference images not displayed]

FINDINGS: Cardiovascular: Good contrast bolus timing in the pulmonary arterial
tree. Mild lower lung respiratory motion.

No focal filling defect identified in the pulmonary arteries to
suggest acute pulmonary embolism.

Negative visible aorta. Calcified coronary artery atherosclerosis
and/or stent. No cardiomegaly or pericardial effusion.

Mediastinum/Nodes: Negative. No lymphadenopathy.

Lungs/Pleura: Major airways are patent. Lower lung volumes compared
to [place], but both lungs remain essentially clear. There is a
stable small right middle lobe subpleural lung nodule since
03/06/2018 on series 6, image 71 (benign).

Upper Abdomen: Multiple small hypodense areas in the visible liver
appear stable since [REDACTED] and are probably benign. Negative
visible gallbladder, spleen, pancreas, adrenal glands, kidneys and
bowel in the upper abdomen.

Musculoskeletal: Increased round sclerotic lesions scattered
throughout the visible spine compared to February 2018. larger round
sclerotic lesions in the C7 and T3 vertebrae. Chronic lesions at T9
and T12 also appear mildly progressed. Increased conspicuity also of
small lesions in the sternum. No lytic or destructive osseous lesion
identified.

Review of the MIP images confirms the above findings.
IMPRESSION: 1. Negative for acute pulmonary embolus.
2. No acute pulmonary finding. Stable since 1004 and benign right
middle lobe lung nodule.
3. Numerous small sclerotic bone lesions with some areas progressed
since February 2018. These are most compatible with skeletal
metastases from prostate cancer in this setting.

## 2020-07-19 ENCOUNTER — Ambulatory Visit: Payer: Medicaid Other | Admitting: Cardiology

## 2020-08-24 ENCOUNTER — Other Ambulatory Visit: Payer: Self-pay | Admitting: Nurse Practitioner

## 2020-08-24 MED ORDER — ALBUTEROL SULFATE HFA 108 (90 BASE) MCG/ACT IN AERS: 2.0000 | INHALATION_SPRAY | Freq: Four times a day (QID) | RESPIRATORY_TRACT | 11 refills | Status: DC | PRN

## 2020-08-27 ENCOUNTER — Inpatient Hospital Stay: Payer: Medicaid Other

## 2020-08-29 ENCOUNTER — Other Ambulatory Visit: Payer: Self-pay | Admitting: Oncology

## 2020-08-29 ENCOUNTER — Inpatient Hospital Stay: Payer: Medicaid Other | Admitting: Oncology

## 2020-08-29 DIAGNOSIS — C801 Malignant (primary) neoplasm, unspecified: Secondary | ICD-10-CM

## 2020-08-29 NOTE — Progress Notes (Signed)
FP

## 2020-09-06 ENCOUNTER — Ambulatory Visit (INDEPENDENT_AMBULATORY_CARE_PROVIDER_SITE_OTHER): Payer: Medicaid Other

## 2020-09-06 DIAGNOSIS — I255 Ischemic cardiomyopathy: Secondary | ICD-10-CM | POA: Diagnosis not present

## 2020-09-10 LAB — CUP PACEART REMOTE DEVICE CHECK
Battery Remaining Longevity: 55 mo
Battery Remaining Percentage: 93 %
Battery Voltage: 2.99 V
Brady Statistic AP VP Percent: 1.6 %
Brady Statistic AP VS Percent: 1 %
Brady Statistic AS VP Percent: 90 %
Brady Statistic AS VS Percent: 8.1 %
Brady Statistic RA Percent Paced: 1.6 %
Date Time Interrogation Session: 20220630020008
HighPow Impedance: 69 Ohm
Implantable Lead Implant Date: 20220330
Implantable Lead Implant Date: 20220330
Implantable Lead Implant Date: 20220330
Implantable Lead Location: 753858
Implantable Lead Location: 753859
Implantable Lead Location: 753860
Implantable Pulse Generator Implant Date: 20220330
Lead Channel Impedance Value: 360 Ohm
Lead Channel Impedance Value: 460 Ohm
Lead Channel Impedance Value: 740 Ohm
Lead Channel Pacing Threshold Amplitude: 0.5 V
Lead Channel Pacing Threshold Amplitude: 0.5 V
Lead Channel Pacing Threshold Amplitude: 0.75 V
Lead Channel Pacing Threshold Pulse Width: 0.5 ms
Lead Channel Pacing Threshold Pulse Width: 0.5 ms
Lead Channel Pacing Threshold Pulse Width: 0.5 ms
Lead Channel Sensing Intrinsic Amplitude: 1.8 mV
Lead Channel Sensing Intrinsic Amplitude: 12 mV
Lead Channel Setting Pacing Amplitude: 3.5 V
Lead Channel Setting Pacing Amplitude: 3.5 V
Lead Channel Setting Pacing Amplitude: 3.5 V
Lead Channel Setting Pacing Pulse Width: 0.5 ms
Lead Channel Setting Pacing Pulse Width: 0.5 ms
Lead Channel Setting Sensing Sensitivity: 0.5 mV
Pulse Gen Serial Number: 111039291

## 2020-09-17 ENCOUNTER — Inpatient Hospital Stay: Payer: Medicaid Other | Attending: Oncology

## 2020-09-17 DIAGNOSIS — Z79899 Other long term (current) drug therapy: Secondary | ICD-10-CM | POA: Diagnosis not present

## 2020-09-17 DIAGNOSIS — C801 Malignant (primary) neoplasm, unspecified: Secondary | ICD-10-CM

## 2020-09-17 DIAGNOSIS — K118 Other diseases of salivary glands: Secondary | ICD-10-CM

## 2020-09-17 DIAGNOSIS — C7951 Secondary malignant neoplasm of bone: Secondary | ICD-10-CM | POA: Insufficient documentation

## 2020-09-17 DIAGNOSIS — C61 Malignant neoplasm of prostate: Secondary | ICD-10-CM | POA: Diagnosis not present

## 2020-09-17 DIAGNOSIS — Z7189 Other specified counseling: Secondary | ICD-10-CM

## 2020-09-17 DIAGNOSIS — Z923 Personal history of irradiation: Secondary | ICD-10-CM | POA: Insufficient documentation

## 2020-09-17 LAB — COMPREHENSIVE METABOLIC PANEL
ALT: 20 U/L (ref 0–44)
AST: 31 U/L (ref 15–41)
Albumin: 3.9 g/dL (ref 3.5–5.0)
Alkaline Phosphatase: 75 U/L (ref 38–126)
Anion gap: 11 (ref 5–15)
BUN: 11 mg/dL (ref 6–20)
CO2: 23 mmol/L (ref 22–32)
Calcium: 9.3 mg/dL (ref 8.9–10.3)
Chloride: 99 mmol/L (ref 98–111)
Creatinine, Ser: 0.72 mg/dL (ref 0.61–1.24)
GFR, Estimated: >60 mL/min (ref >60)
Glucose, Bld: 84 mg/dL (ref 70–99)
Potassium: 3.9 mmol/L (ref 3.5–5.1)
Sodium: 133 mmol/L — ABNORMAL LOW (ref 135–145)
Total Bilirubin: <0.1 mg/dL — ABNORMAL LOW (ref 0.3–1.2)
Total Protein: 7.9 g/dL (ref 6.5–8.1)

## 2020-09-17 LAB — CBC WITH DIFFERENTIAL/PLATELET
Abs Immature Granulocytes: 0.02 10*3/uL (ref 0.00–0.07)
Basophils Absolute: 0 10*3/uL (ref 0.0–0.1)
Basophils Relative: 1 %
Eosinophils Absolute: 0.1 10*3/uL (ref 0.0–0.5)
Eosinophils Relative: 1 %
HCT: 35.2 % — ABNORMAL LOW (ref 39.0–52.0)
Hemoglobin: 11.3 g/dL — ABNORMAL LOW (ref 13.0–17.0)
Immature Granulocytes: 0 %
Lymphocytes Relative: 21 %
Lymphs Abs: 1 10*3/uL (ref 0.7–4.0)
MCH: 24.1 pg — ABNORMAL LOW (ref 26.0–34.0)
MCHC: 32.1 g/dL (ref 30.0–36.0)
MCV: 75.1 fL — ABNORMAL LOW (ref 80.0–100.0)
Monocytes Absolute: 0.6 10*3/uL (ref 0.1–1.0)
Monocytes Relative: 13 %
Neutro Abs: 3.1 10*3/uL (ref 1.7–7.7)
Neutrophils Relative %: 64 %
Platelets: 292 10*3/uL (ref 150–400)
RBC: 4.69 MIL/uL (ref 4.22–5.81)
RDW: 23.1 % — ABNORMAL HIGH (ref 11.5–15.5)
WBC: 4.8 10*3/uL (ref 4.0–10.5)
nRBC: 0 % (ref 0.0–0.2)

## 2020-09-17 LAB — LACTATE DEHYDROGENASE: LDH: 128 U/L (ref 98–192)

## 2020-09-17 LAB — PSA: Prostatic Specific Antigen: 0.96 ng/mL (ref 0.00–4.00)

## 2020-09-18 LAB — BETA HCG QUANT (REF LAB): hCG Quant: <1 m[IU]/mL (ref 0–3)

## 2020-09-18 LAB — AFP TUMOR MARKER: AFP, Serum, Tumor Marker: 3.7 ng/mL (ref 0.0–8.4)

## 2020-09-19 ENCOUNTER — Inpatient Hospital Stay (HOSPITAL_BASED_OUTPATIENT_CLINIC_OR_DEPARTMENT_OTHER): Payer: Medicaid Other | Admitting: Oncology

## 2020-09-19 ENCOUNTER — Encounter: Payer: Self-pay | Admitting: Oncology

## 2020-09-19 VITALS — BP 155/88 | HR 72 | Temp 97.4°F | Resp 18 | Wt 146.7 lb

## 2020-09-19 DIAGNOSIS — C801 Malignant (primary) neoplasm, unspecified: Secondary | ICD-10-CM | POA: Diagnosis not present

## 2020-09-19 DIAGNOSIS — Z79818 Long term (current) use of other agents affecting estrogen receptors and estrogen levels: Secondary | ICD-10-CM | POA: Insufficient documentation

## 2020-09-19 DIAGNOSIS — C61 Malignant neoplasm of prostate: Secondary | ICD-10-CM | POA: Diagnosis not present

## 2020-09-19 NOTE — Progress Notes (Signed)
Hematology/Oncology follow up note [facility] Telephone:([phone]) [phone] Fax:(336) [phone]   Patient Care Team: Azzie Glatter, FNP (Inactive) as PCP - General (Family Medicine) [name], MD as PCP - Cardiology (Cardiology) Constance Haw, MD as PCP - Electrophysiology (Cardiology) McKenzie, Candee Furbish, MD as Consulting Physician (Urology)  REFERRING PROVIDER: Azzie Glatter, FNP  CHIEF COMPLAINTS/REASON FOR VISIT:  Follow up for prostate cancer  HISTORY OF PRESENTING ILLNESS:   Daniel Weiss is a  60 y.o.  male with PMH listed below was seen in consultation at the request of  Azzie Glatter, FNP  for evaluation of presumed prostate cancer Patient recently switched his care to [facility] and was seen by Dr. [name]. He was previously followed up at [facility] urology I reviewed Dr. Dene Gentry note. Patient was diagnosed with presumed prostate cancer on 10/2018 with a PSA of 509, incidental findings of multiple sclerotic lesions on CT-chest abdomen pelvis during work-up for a CVA.  His previous urology care was with Dr. Alyson Ingles. 11/05/2018 bone scan showed solitary punctate focus of activity in the left first sacral segment corresponding to a 9 mm mixed lytic and sclerotic metastatic's on recent CT.  The remaining numerous sclerotic osseous metastasis identified on CT do not demonstrate abnormal activity and are therefore healed.  At that time No tissue diagnosis/biopsy was obtained. Patient was started with Lupron and Casodex Lupron was eventually to Eligard [facility], PSA was 13.13, testosterone level less than 10. Casodex was discontinued in June 2021.   10/26/2019 patient switched to [facility] urology and was seen by Dr. [name] Per urology note, PSA trend: 11/03/2018: 502 11/22/2018: 377 02/21/2019: 387 04/25/2019: 123 09/05/2019: 13.3  Patient gets androgen deprivation  therapy through Dr. Dene Gentry office.  Eligard 45mg   on 11/11/2019 -Patient was referred to heme-onc on 11/01/2019.  10/06/2019 bone scan showed resolution of punctated activity over the left sacrum.  The punctate area of increased activity over the right lower lumbar spine again noted. 10/31/2019, CT angio chest aorta showed no evidence of aortic aneurysm or dissection.  Atherosclerotic calcification spleen seen in the upper abdominal aorta.  Chronic artery disease.  No acute cardiopulmonary disease. Patient has a chronic right neck mass.  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   11/18/2019 CT abdomen pelvis with contrast showed retrocaval lymphadenopathy in the right common and external iliac chains.  Stable to minimally progressed in the interval since last CT scan 11/03/2018 Numerous sclerotic bone lesions.  Stable 3 mm subpleural right middle lobe lung nodule stable.  Aortic atherosclerosis   Patient has extensive cardiology issues.  He follows up with Dr. Ellyn Hack. Patient has history of STEMI-PCI to LAD with resultant ischemia cardiomyopathy, EF 35 to 40%, left bundle branch block with acute CVA-10/2018.  CT coronary showed nonobstructing plaquing of coronary distributions, intimal irregularity with filling defect in the ascending aorta but no LV thrombus.  And the patient is on warfarin and Plavix.  Patient was seen by thoracic surgeon Dr. Roxy Manns will recommend patient to be on Coumadin for minimum of 3 months.  With presentation with stroke, Dr. Ellyn Hack recommend at least 6 to 12 months of Coumadin. -Coumadin was discontinued by Dr. Ellyn Hack in September 2021 and switched to.  Plavix Patient lives with his sister.  He has 3 adult children.  His activity is  quite limited due to shortness of breath with exertion due to cardiology problems.  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 Tumor markers Showed normal LDH, beta hCG, AFP # 01/17/2020 scrotum ultrasound showed a small right testicular mass measuring 1.6 cm, contained an internal calcification.  Appearance was consistent with primary testicular neoplasm.  Normal size and appearance of the left testicle.  #Prostate cancer, metastatic M1 A.  On androgen deprivation therapy.  Eligard through Dr. Dene Gentry office. 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  #01/17/2020 right orchiectomy residual germ cell tumor and germ cell neoplasia in situ are not identified.  Case was discussed on tumor board.  Consensus reached a point although residual germ cell tumor/infection was not identified, the presence of scar with extensive tubular atrophy is compatible with a completely regressed germ cell tumor.  He went to [facility] for second opinion as I recommended. Due to long waiting time, he left without being seen. Dr.Rush from [facility] called and agrees with patient current treatment.  11/11/2019 Eligard 45mg   on  Dr. Dene Gentry office missed his appointment with urologist on 05/11/2019 for Eligard treatments.  04/12/2020- 05/08/2020  radiation to periaortic lymph nodes as well as right hemipelvis node. 06/08/2020 Eligard 45mg  at cancer center  # Parotid mass, radiographically stable, never officially evaluated.  Patient has an ENT evaluation.  Status post biopsy- WARTHIN TUMOR.  Negative for atypia and malignancy. INTERVAL HISTORY Daniel Weiss is a 60 y.o. male who has above history reviewed by me today presents for follow up visit for management of metastatic prostate cancer and germ cell tumor.  Problems and complaints are listed below: He has had CRT-D implant. Reports feeling well. No  new complaints. Chornic SOB Sleeping problem.  2 days watery diarrhea, no fever chills, abdominal pain.    Review of Systems  Constitutional:  Negative for appetite change, chills, fatigue, fever and unexpected weight change.  HENT:   Negative for hearing loss and voice change.   Eyes:  Negative for eye problems and icterus.  Respiratory:  Positive for shortness of breath. Negative for chest tightness and cough.   Cardiovascular:  Negative for chest pain and leg swelling.  Gastrointestinal:  Negative for abdominal distention and abdominal pain.  Endocrine: Negative for hot flashes.  Genitourinary:  Negative for difficulty urinating, dysuria and frequency.   Musculoskeletal:  Negative for arthralgias.       Left hip pain  Skin:  Negative for itching and rash.  Neurological:  Negative for dizziness, light-headedness and numbness.  Hematological:  Negative for adenopathy. Does not bruise/bleed easily.  Psychiatric/Behavioral:  Negative for confusion.    MEDICAL HISTORY:  Past Medical History:  Diagnosis Date   Abnormal chest CT 02/2018   a. Ectatic 3 cm Ao arch - rec f/u outpt imaging w/ CTA or MRA. Ectatic atheromatous abd Ao @ risk for aneurysm - rec f/u u/s in 5 yrs. Marked prostatic enlargement w/ scattered small sclerotic foci in the thoracic lower lumbar spine, sacrum, left 12th rib, pelvis, and right femur.  Recommend elective outpatient whole-body bone scintigraphy and PSA.   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 02/2018 ACS/PCI: LM nl, LAD 85p (3.5x15 Sierra DES), D1 45ost, RI 55ost, RCA nl, EF 25-35%.   Cardiac murmur    Chest pain on exertion 11/28/2019   Chronic combined systolic and diastolic CHF, NYHA class 2 ([place])  04/17/2020   Complete left bundle branch block (LBBB) 2016   Coronary artery disease involving native coronary artery of native heart with angina pectoris ([place]) 03/10/2018   Dyspnea    Encephalopathy, hypertensive    Essential hypertension 07/07/2014   Germ cell tumor ([place]) 12/22/2019   Goals of care, counseling/discussion 12/22/2019   Hematemesis without nausea    HFrEF (heart failure with reduced ejection fraction) ([place])    History of CVA (cerebrovascular accident) 11/23/2018   History of non-ST elevation myocardial infarction (NSTEMI) 02/21/2019   Hypertension    Hypokalemia    Hyponatremia 06/29/2015   Ischemic cardiomyopathy 02/2018   a. 02/2018 Echo: EF 35-40% (LV gram on cath was 25-30%), mod, diff HK. mid-apicalanteroseptal, ant, and apical sev HK. RVH.-->  No notable change on echo from June 2020 and August 2020.   Localized swelling, mass or lump of neck    Long term (current) use of anticoagulants 11/16/2018   Malnutrition of moderate degree 11/11/2018   Mass of right side of neck 07/07/2014   Myocardial infarction [facility]    Neck mass    Right Neck mass - US neck showed 6.3 cm complex, partially cystic, partially solid mass.  CT neck showed 2 cm mass from parotid gland on right, with 3x4x5cm  Possible metastatic lymphadenopathy.  we recommended following up with Dr. Redmond Baseman ENT for biopsy of this.      Non-ST elevation (NSTEMI) myocardial infarction ([place]) 03/06/2018   NSTEMI (non-ST elevated myocardial infarction) ([place]) 02/2018   85% pLAD - DES PCI    Paresthesia 07/07/2014   Prostate cancer ([place]) 03/10/2018   Prostate cancer metastatic to multiple sites [facility]) 11/2018   Right sided weakness 07/07/2014   Stroke [facility])    TIA (transient ischemic attack) 2016   Warthin's tumor     SURGICAL HISTORY: Past Surgical History:  Procedure Laterality Date   BIV ICD INSERTION CRT-D N/A 06/06/2020   Procedure: BIV ICD INSERTION CRT-D;  Surgeon: Constance Haw, MD;  Location: [place] [facility]  [facility];  Service: Cardiovascular;  Laterality: N/A;   COLONOSCOPY WITH PROPOFOL N/A 02/27/2020   Procedure: COLONOSCOPY WITH PROPOFOL;  Surgeon: Yetta Flock, MD;  Location: [place];  Service: Gastroenterology;  Laterality: N/A;   CORONARY STENT INTERVENTION N/A 03/06/2018   Procedure: CORONARY STENT INTERVENTION;  Surgeon: [name], MD;  Location: [place] [facility];  Service: Cardiovascular: Proximal LAD 85% stenosis (and D1): DES PCI with Xience Sierra DES 3.5 mm x 15 mm - 3.9 mm   ESOPHAGOGASTRODUODENOSCOPY (EGD) WITH PROPOFOL N/A 02/27/2020   Procedure: ESOPHAGOGASTRODUODENOSCOPY (EGD) WITH PROPOFOL;  Surgeon: Yetta Flock, MD;  Location: [facility] ENDOSCOPY;  Service: Gastroenterology;  Laterality: N/A;   HEMOSTASIS CLIP PLACEMENT  02/27/2020   Procedure: HEMOSTASIS CLIP PLACEMENT;  Surgeon: Yetta Flock, MD;  Location: [place] ENDOSCOPY;  Service: Gastroenterology;;   HOT HEMOSTASIS N/A 02/27/2020   Procedure: HOT HEMOSTASIS (ARGON PLASMA COAGULATION/BICAP);  Surgeon: Yetta Flock, MD;  Location: [facility] ENDOSCOPY;  Service: Gastroenterology;  Laterality: N/A;   INTRAOPERATIVE TRANSTHORACIC ECHOCARDIOGRAM  03/06/2018   (Peri-MI) mild reduced EF 35-40%.  Diffuse hypokinesis (severe HK of the mid-apical anteroseptal, anterior and apical myocardium consistent with LAD infarct).  Unable to assess diastolic function.  Paradoxical septal motion likely related to his LBBB.  RV hypertrophy noted.  Trivial pericardial effusion noted.    LEFT HEART CATH AND CORONARY ANGIOGRAPHY N/A 03/06/2018   Procedure: LEFT HEART CATH AND CORONARY ANGIOGRAPHY;  Surgeon: [name], MD;  Location: [place] [facility];  Service: Cardiovascular: Proximal LAD 85% involving D1 45%.  Ostial RI 55%.  Severe LV dysfunction EF 25 to 30%.  1+ MR.  Only minimally elevated LVEDP.   ORCHIECTOMY Right 01/17/2020   Procedure: ORCHIECTOMY;  Surgeon: Abbie Sons, MD;  Location: [facility] ORS;  Service:  Urology;  Laterality: Right;   POLYPECTOMY  02/27/2020   Procedure: POLYPECTOMY;  Surgeon: Yetta Flock, MD;  Location: [facility] ENDOSCOPY;  Service: Gastroenterology;;   PROSTATE BIOPSY N/A 01/17/2020   Procedure: BIOPSY TRANSRECTAL ULTRASONIC PROSTATE (TUBP);  Surgeon: Abbie Sons, MD;  Location: [facility] ORS;  Service: Urology;  Laterality: N/A;   TRANSTHORACIC ECHOCARDIOGRAM  10/2018   (Most recent echo, to evaluate for TIA/CVA) : Moderate reduced EF 35 to 40%.  Moderate concentric LVH.  Mild dyskinesis of the apex and severe hypokinesis of mid apical anteroseptal and anterior wall.  Unable to assess RV pressures.  Aortic sclerosis with no stenosis.  No significant change seen    SOCIAL HISTORY: Social History   Socioeconomic History   Marital status: Legally Separated    Spouse name: Dorothenia   Number of children: Not on file   Years of education: Not on file   Highest education level: Not on file  Occupational History   Occupation: cook  Tobacco Use   Smoking status: Some Days    Packs/day: 0.00    Years: 0.00    Pack years: 0.00    Types: Cigarettes   Smokeless tobacco: Never   Tobacco comments:    quit three days ago  Vaping Use   Vaping Use: Never used  Substance and Sexual Activity   Alcohol use: Yes    Alcohol/week: 2.0 standard drinks    Types: 2 Cans of beer per week    Comment: 2 Beers daily.   Drug use: Yes    Frequency: 3.0 times per week    Types: Marijuana   Sexual activity: Not Currently  Other Topics Concern   Not on file  Social History Narrative   Currently not working.  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  Now has cancer.   Social Determinants of Health   Financial Resource Strain: Not on file  Food Insecurity: Not on file  Transportation Needs: Not on file  Physical Activity: Not on file  Stress: Not on file  Social Connections: Not on file  Intimate Partner Violence: Not on file    FAMILY  HISTORY: Family History  Problem Relation Age of Onset   Stroke Mother    Hypertension Mother    Diabetes type II Mother    Leukemia Other    Breast cancer Other    Stroke Brother    CAD Neg Hx     ALLERGIES:  has No Known Allergies.  MEDICATIONS:  Current Outpatient Medications  Medication Sig Dispense Refill   albuterol (VENTOLIN HFA) 108 (90 Base) MCG/ACT inhaler Inhale 2 puffs into the lungs every 6 (six) hours as needed for wheezing or shortness of breath. 8 g 11  bicalutamide (CASODEX) 50 MG tablet Take 1 tablet by mouth twice daily (Patient taking differently: Take by mouth 2 (two) times daily.) 60 tablet 0   clopidogrel (PLAVIX) 75 MG tablet Take 1 tablet (75 mg total) by mouth daily.     ezetimibe (ZETIA) 10 MG tablet Take 1 tablet (10 mg total) by mouth daily. 90 tablet 3   metoprolol succinate (TOPROL-XL) 25 MG 24 hr tablet TAKE 1 TABLET BY MOUTH ONCE DAILY WITH  SUPPER 45 tablet 10   sacubitril-valsartan (ENTRESTO) 49-51 MG Take 1 tablet by mouth 2 (two) times daily. 60 tablet 6   spironolactone (ALDACTONE) 25 MG tablet Take 1 tablet (25 mg total) by mouth daily. 90 tablet 3   tamsulosin (FLOMAX) 0.4 MG CAPS capsule Take 1 capsule (0.4 mg total) by mouth daily after supper. (Patient taking differently: Take 0.4 mg by mouth every 12 (twelve) hours.) 90 capsule 3   venlafaxine XR (EFFEXOR XR) 37.5 MG 24 hr capsule Take 1 capsule (37.5 mg total) by mouth daily with breakfast. 30 capsule 1   furosemide (LASIX) 20 MG tablet May take 20 mg tablet as needed for increase shortness of breath or swelling (Patient not taking: No sig reported) 20 tablet 6   nitroGLYCERIN (NITROSTAT) 0.4 MG SL tablet Place 1 tablet (0.4 mg total) under the tongue every 5 (five) minutes as needed for chest pain. (Patient not taking: Reported on 09/19/2020) 25 tablet 3   ondansetron (ZOFRAN) 4 MG tablet Take 4 mg by mouth every 4 (four) hours as needed for nausea or vomiting.  (Patient not taking: Reported  on 09/19/2020)     rosuvastatin (CRESTOR) 40 MG tablet Take 1 tablet by mouth once daily (Patient not taking: No sig reported) 30 tablet 1   traZODone (DESYREL) 50 MG tablet Take 0.5-1 tablets (25-50 mg total) by mouth at bedtime as needed for sleep. (Patient not taking: No sig reported) 30 tablet 6   No current facility-administered medications for this visit.   Facility-Administered Medications Ordered in Other Visits  Medication Dose Route Frequency Provider Last Rate Last Admin   leuprolide (6 Month) (ELIGARD) injection 45 mg  45 mg Subcutaneous Once Earlie Server, MD         PHYSICAL EXAMINATION: ECOG PERFORMANCE STATUS: 1 - Symptomatic but completely ambulatory Vitals:   09/19/20 1424  BP: (!) 155/88  Pulse: 72  Resp: 18  Temp: (!) 97.4 F (36.3 C)   Filed Weights   09/19/20 1424  Weight: 146 lb 11.2 oz (66.5 kg)    Physical Exam Constitutional:      General: He is not in acute distress.    Comments: Thin built.   HENT:     Head: Normocephalic and atraumatic.  Eyes:     General: No scleral icterus. Neck:     Comments: Palpable right cervical mass Cardiovascular:     Rate and Rhythm: Normal rate and regular rhythm.     Heart sounds: Murmur heard.  Pulmonary:     Effort: Pulmonary effort is normal. No respiratory distress.     Breath sounds: No wheezing.  Abdominal:     General: Bowel sounds are normal. There is no distension.     Palpations: Abdomen is soft.  Musculoskeletal:        General: No deformity. Normal range of motion.     Cervical back: Normal range of motion and neck supple.  Skin:    General: Skin is warm and dry.     Findings: No erythema  or rash.  Neurological:     Mental Status: He is alert and oriented to person, place, and time. Mental status is at baseline.     Cranial Nerves: No cranial nerve deficit.     Coordination: Coordination normal.  Psychiatric:        Mood and Affect: Mood normal.    LABORATORY DATA:  I have reviewed the data as  listed Lab Results  Component Value Date   WBC 4.8 09/17/2020   HGB 11.3 (L) 09/17/2020   HCT 35.2 (L) 09/17/2020   MCV 75.1 (L) 09/17/2020   PLT 292 09/17/2020   Recent Labs    11/01/19 1021 12/15/19 0920 02/10/20 1000 02/22/20 2137 02/28/20 0302 05/10/20 1043 06/01/20 1300 09/17/20 1138  NA 138   < > 132*   < > 138 136 132* 133*  K 4.3   < > 4.3   < > 3.4* 4.1 3.7 3.9  CL 102   < > 96*   < > 99 101 100 99  CO2 25   < > 24   < > 27 23 21* 23  GLUCOSE 107*   < > 126*   < > 103* 102* 83 84  BUN 11   < > 14   < > 7 5* 8 11  CREATININE 0.78   < > 0.92   < > 0.77 0.73* 0.84 0.72  CALCIUM 9.2   < > 9.1   < > 9.8 10.2 9.3 9.3  GFRNONAA >60   < > >60   < > >60  --  >60 >60  GFRAA >60  --   --   --   --   --   --   --   PROT 7.6   < > 7.1  --   --   --  7.5 7.9  ALBUMIN 3.8   < > 3.7  --   --   --  3.8 3.9  AST 44*   < > 64*  --   --   --  21 31  ALT 31   < > 39  --   --   --  12 20  ALKPHOS 78   < > 53  --   --   --  61 75  BILITOT 0.5   < > 0.6  --   --   --  0.3 <0.1*   < > = values in this interval not displayed.    Iron/TIBC/Ferritin/ %Sat No results found for: IRON, TIBC, FERRITIN, IRONPCTSAT    RADIOGRAPHIC STUDIES: I have personally reviewed the radiological images as listed and agreed with the findings in the report. CUP PACEART REMOTE DEVICE CHECK  Result Date: 09/10/2020 Scheduled remote reviewed. Normal device function.  (46) HVR events logged 6 seconds to 17 minutes 22 seconds w/ rates 170's bpm; available EGMs suggests SVT Next remote 91 days. HB     ASSESSMENT & PLAN:  1. Germ cell tumor ([place])   2. Prostate cancer ([place])   3. 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 PSA is 0.96 Hold off adding additional chemotherapy or oral agent at this point, due to  his chronic heart condition.  #Germ cell tumor, stage II  03/14/20 PET was reviewed and discussed with patient. - small hypermetabolic lymph nodes along the RIGHT iliac vessels is most consistent with nodal metastasis. Status post radiation.  Normal AFP, hCG, LDH were normal. 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  Not hypermetabolic on PET scan. Monitor.    All questions were answered. The patient knows to call the clinic with any problems questions or concerns.  Return of visit: 3 months.  Earlie Server, MD, PhD Hematology Oncology [facility] Pager- [phone] 09/19/2020

## 2020-09-19 NOTE — Progress Notes (Signed)
Pt here for follow up. Pt reports problems sleeping and watery stools.

## 2020-09-25 NOTE — Progress Notes (Signed)
Remote ICD transmission.   

## 2020-09-29 NOTE — Progress Notes (Signed)
Electrophysiology Office Note   Date:  09/29/2020   ID:  Daniel Weiss, DOB 1960-11-04, MRN [id number]  PCP:  Azzie Glatter, FNP (Inactive)  Cardiologist:  Ellyn Hack Primary Electrophysiologist:  Javohn Basey Meredith Leeds, MD    Chief Complaint: CHF   History of Present Illness: Daniel Weiss is a 60 y.o. male who is being seen today for the evaluation of CHF at the request of Azzie Glatter, [place]. Presenting today for electrophysiology evaluation.  He has a history significant for anterior STEMI with PCI to the LAD in 2019, ischemic cardiomyopathy with chronic combined systolic and diastolic heart failure, left bundle branch block, and CVA.  He is now status post [place] CRT-D implanted 06/06/2020.  Today, denies symptoms of palpitations, chest pain, shortness of breath, orthopnea, PND, lower extremity edema, claudication, dizziness, presyncope, syncope, bleeding, or neurologic sequela. The patient is tolerating medications without difficulties.  Since being seen he has done well.  He feels improved since his device was implanted with less fatigue and shortness of breath.  He is overall happy with the way he has been feeling since device was implanted.   Past Medical History:  Diagnosis Date   Abnormal chest CT 02/2018   a. Ectatic 3 cm Ao arch - rec f/u outpt imaging w/ CTA or MRA. Ectatic atheromatous abd Ao @ risk for aneurysm - rec f/u u/s in 5 yrs. Marked prostatic enlargement w/ scattered small sclerotic foci in the thoracic lower lumbar spine, sacrum, left 12th rib, pelvis, and right femur.  Recommend elective outpatient whole-body bone scintigraphy and PSA.   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 02/2018 ACS/PCI: LM nl,  LAD 85p (3.5x15 Sierra DES), D1 45ost, RI 55ost, RCA nl, EF 25-35%.   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 02/2018 Echo: EF 35-40% (LV gram on cath was 25-30%), mod, diff HK. mid-apicalanteroseptal, ant, and apical sev HK. RVH.-->  No notable change on echo from June 2020 and August 2020.   Localized swelling, mass or lump of neck    Long term (current) use of anticoagulants 11/16/2018   Malnutrition of moderate degree 11/11/2018   Mass of right side of neck 07/07/2014   Myocardial infarction [facility])    Neck mass    Right Neck mass - US neck showed 6.3 cm complex, partially cystic, partially solid mass.  CT neck showed 2 cm mass from parotid gland on right, with 3x4x5cm  Possible metastatic lymphadenopathy.  we recommended following  up with Dr. Redmond Baseman ENT for biopsy of this.      Non-ST elevation (NSTEMI) myocardial infarction ([place]) 03/06/2018   NSTEMI (non-ST elevated myocardial infarction) ([place]) 02/2018   85% pLAD - DES PCI    Paresthesia 07/07/2014   Prostate cancer ([place]) 03/10/2018   Prostate cancer metastatic to multiple sites [facility]) 11/2018   Right sided weakness 07/07/2014   Stroke [facility])    TIA (transient ischemic attack) 2016   Warthin's tumor    Past Surgical History:   Procedure Laterality Date   BIV ICD INSERTION CRT-D N/A 06/06/2020   Procedure: BIV ICD INSERTION CRT-D;  Surgeon: Constance Haw, MD;  Location: [place] CV LAB;  Service: Cardiovascular;  Laterality: N/A;   COLONOSCOPY WITH PROPOFOL N/A 02/27/2020   Procedure: COLONOSCOPY WITH PROPOFOL;  Surgeon: Yetta Flock, MD;  Location: [place];  Service: Gastroenterology;  Laterality: N/A;   CORONARY STENT INTERVENTION N/A 03/06/2018   Procedure: CORONARY STENT INTERVENTION;  Surgeon: [name], MD;  Location: [place] CV LAB;  Service: Cardiovascular: Proximal LAD 85% stenosis (and D1): DES PCI with Xience Sierra DES 3.5 mm x 15 mm - 3.9 mm   ESOPHAGOGASTRODUODENOSCOPY (EGD) WITH PROPOFOL N/A 02/27/2020   Procedure: ESOPHAGOGASTRODUODENOSCOPY (EGD) WITH PROPOFOL;  Surgeon: Yetta Flock, MD;  Location[facility] ENDOSCOPY;  Service: Gastroenterology;  Laterality: N/A;   HEMOSTASIS CLIP PLACEMENT  02/27/2020   Procedure: HEMOSTASIS CLIP PLACEMENT;  Surgeon: Yetta Flock, MD;  Location: [place] ENDOSCOPY;  Service: Gastroenterology;;   HOT HEMOSTASIS N/A 02/27/2020   Procedure: HOT HEMOSTASIS (ARGON PLASMA COAGULATION/BICAP);  Surgeon: Yetta Flock, MD;  Location: [facility] ENDOSCOPY;  Service: Gastroenterology;  Laterality: N/A;   INTRAOPERATIVE TRANSTHORACIC ECHOCARDIOGRAM  03/06/2018   (Peri-MI) mild reduced EF 35-40%.  Diffuse hypokinesis (severe HK of the mid-apical anteroseptal, anterior and apical myocardium consistent with LAD infarct).  Unable to assess diastolic function.  Paradoxical septal motion likely related to his LBBB.  RV hypertrophy noted.  Trivial pericardial effusion noted.    LEFT HEART CATH AND CORONARY ANGIOGRAPHY N/A 03/06/2018   Procedure: LEFT HEART CATH AND CORONARY ANGIOGRAPHY;  Surgeon: [name], MD;  Location: [place] CV LAB;  Service: Cardiovascular: Proximal LAD 85% involving D1 45%.  Ostial RI 55%.  Severe LV dysfunction EF 25  to 30%.  1+ MR.  Only minimally elevated LVEDP.   ORCHIECTOMY Right 01/17/2020   Procedure: ORCHIECTOMY;  Surgeon: Abbie Sons, MD;  Location: [facility] ORS;  Service: Urology;  Laterality: Right;   POLYPECTOMY  02/27/2020   Procedure: POLYPECTOMY;  Surgeon: Yetta Flock, MD;  Location: [facility] ENDOSCOPY;  Service: Gastroenterology;;   PROSTATE BIOPSY N/A 01/17/2020   Procedure: BIOPSY TRANSRECTAL ULTRASONIC PROSTATE (TUBP);  Surgeon: Abbie Sons, MD;  Location: [facility] ORS;  Service: Urology;  Laterality: N/A;   TRANSTHORACIC ECHOCARDIOGRAM  10/2018   (Most recent echo, to evaluate for TIA/CVA) : Moderate reduced EF 35 to 40%.  Moderate concentric LVH.  Mild dyskinesis of the apex and severe hypokinesis of mid apical anteroseptal and anterior wall.  Unable to assess RV pressures.  Aortic sclerosis with no stenosis.  No significant change seen     Current Outpatient Medications  Medication Sig Dispense Refill   albuterol (VENTOLIN HFA) 108 (90 Base) MCG/ACT inhaler Inhale 2 puffs into the lungs every 6 (six) hours as needed for wheezing or shortness of breath. 8 g 11   bicalutamide (CASODEX) 50 MG tablet Take 1 tablet by mouth twice daily (Patient taking  differently: Take by mouth 2 (two) times daily.) 60 tablet 0   clopidogrel (PLAVIX) 75 MG tablet Take 1 tablet (75 mg total) by mouth daily.     ezetimibe (ZETIA) 10 MG tablet Take 1 tablet (10 mg total) by mouth daily. 90 tablet 3   furosemide (LASIX) 20 MG tablet May take 20 mg tablet as needed for increase shortness of breath or swelling (Patient not taking: No sig reported) 20 tablet 6   metoprolol succinate (TOPROL-XL) 25 MG 24 hr tablet TAKE 1 TABLET BY MOUTH ONCE DAILY WITH  SUPPER 45 tablet 10   nitroGLYCERIN (NITROSTAT) 0.4 MG SL tablet Place 1 tablet (0.4 mg total) under the tongue every 5 (five) minutes as needed for chest pain. (Patient not taking: Reported on 09/19/2020) 25 tablet 3   ondansetron (ZOFRAN) 4 MG tablet Take 4 mg by  mouth every 4 (four) hours as needed for nausea or vomiting.  (Patient not taking: Reported on 09/19/2020)     rosuvastatin (CRESTOR) 40 MG tablet Take 1 tablet by mouth once daily (Patient not taking: No sig reported) 30 tablet 1   sacubitril-valsartan (ENTRESTO) 49-51 MG Take 1 tablet by mouth 2 (two) times daily. 60 tablet 6   spironolactone (ALDACTONE) 25 MG tablet Take 1 tablet (25 mg total) by mouth daily. 90 tablet 3   tamsulosin (FLOMAX) 0.4 MG CAPS capsule Take 1 capsule (0.4 mg total) by mouth daily after supper. (Patient taking differently: Take 0.4 mg by mouth every 12 (twelve) hours.) 90 capsule 3   traZODone (DESYREL) 50 MG tablet Take 0.5-1 tablets (25-50 mg total) by mouth at bedtime as needed for sleep. (Patient not taking: No sig reported) 30 tablet 6   venlafaxine XR (EFFEXOR XR) 37.5 MG 24 hr capsule Take 1 capsule (37.5 mg total) by mouth daily with breakfast. 30 capsule 1   No current facility-administered medications for this visit.   Facility-Administered Medications Ordered in Other Visits  Medication Dose Route Frequency Provider Last Rate Last Admin   leuprolide (6 Month) (ELIGARD) injection 45 mg  45 mg Subcutaneous Once Earlie Server, MD        Allergies:   Patient has no known allergies.   Social History:  The patient  reports that he has been smoking cigarettes. He has never used smokeless tobacco. He reports current alcohol use of about 2.0 standard drinks of alcohol per week. He reports current drug use. Frequency: 3.00 times per week. Drug: Marijuana.   Family History:  The patient's family history includes Breast cancer in an other family member; Diabetes type II in his mother; Hypertension in his mother; Leukemia in an other family member; Stroke in his brother and mother.   ROS:  Please see the history of present illness.   Otherwise, review of systems is positive for none.   All other systems are reviewed and negative.   PHYSICAL EXAM: VS:  There were no vitals  taken for this visit. , BMI There is no height or weight on file to calculate BMI. GEN: Well nourished, well developed, in no acute distress  HEENT: normal  Neck: no JVD, carotid bruits, or masses Cardiac: RRR; no murmurs, rubs, or gallops,no edema  Respiratory:  clear to auscultation bilaterally, normal work of breathing GI: soft, nontender, nondistended, + BS MS: no deformity or atrophy  Skin: warm and dry, device site well healed Neuro:  Strength and sensation are intact Psych: euthymic mood, full affect  EKG:  EKG is ordered today. Personal review  of the ekg ordered shows sinus rhythm, ventricular paced, rate 69  Personal review of the device interrogation today. Results in [place]: 02/23/2020: TSH 1.654 02/25/2020: Magnesium 1.9 09/17/2020: ALT 20; BUN 11; Creatinine, Ser 0.72; Hemoglobin 11.3; Platelets 292; Potassium 3.9; Sodium 133    Lipid Panel     Component Value Date/Time   CHOL 180 02/23/2020 0251   CHOL 133 06/07/2019 1255   TRIG 123 02/23/2020 0251   HDL 54 02/23/2020 0251   HDL 56 06/07/2019 1255   CHOLHDL 3.3 02/23/2020 0251   VLDL 25 02/23/2020 0251   LDLCALC 101 (H) 02/23/2020 0251   LDLCALC 49 06/07/2019 1255     Wt Readings from Last 3 Encounters:  09/19/20 146 lb 11.2 oz (66.5 kg)  06/08/20 152 lb 14.4 oz (69.4 kg)  06/06/20 153 lb (69.4 kg)      Other studies Reviewed: Additional studies/ records that were reviewed today include: TTE 02/23/20  Review of the above records today demonstrates:   1. Akinesis of the distal anteroseptal and apical walls with overall  moderate to severe LV dysfunction.   2. Left ventricular ejection fraction, by estimation, is 30 to 35%. The  left ventricle has moderate to severely decreased function. The left  ventricle demonstrates regional wall motion abnormalities (see scoring  diagram/findings for description). Left  ventricular diastolic parameters are consistent with Grade II diastolic   dysfunction (pseudonormalization).   3. Right ventricular systolic function is normal. The right ventricular  size is normal.   4. The mitral valve is normal in structure. No evidence of mitral valve  regurgitation. No evidence of mitral stenosis.   5. The aortic valve has an indeterminant number of cusps. Aortic valve  regurgitation is trivial. No aortic stenosis is present.   6. The inferior vena cava is normal in size with greater than 50%  respiratory variability, suggesting right atrial pressure of 3 mmHg.    ASSESSMENT AND PLAN:  1.  Chronic systolic heart failure due to ischemic cardiomyopathy: NYHA class II.  Currently on optimal medical therapy.  Is status post [employer] CRT-D implanted 06/06/2020.  Device functioning appropriately.  No changes.  2.  Coronary arteries: Status post LAD stent.  No current chest pain.  Plan per primary cardiology.  3.  Hypertension: Blood pressure significantly elevated today.  Due to that, we Davin Muramoto increase both his Entresto and his metoprolol.  4.  Hyperlipidemia: Continue statin per primary cardiology.  5.  SVT: Multiple episodes noted on device interrogation.  Increasing metoprolol.  Current medicines are reviewed at length with the patient today.   The patient does not have concerns regarding his medicines.  The following changes were made today: Increase metoprolol, Entresto  Labs/ tests ordered today include:  No orders of the defined types were placed in this encounter.    Disposition:   FU with Teleshia Lemere 9 months  Signed, Gloriana Piltz Meredith Leeds, MD  09/29/2020 3:49 PM     [facility] [address] [place]  [address] [phone] (office) [phone] (fax)

## 2020-10-01 ENCOUNTER — Ambulatory Visit (INDEPENDENT_AMBULATORY_CARE_PROVIDER_SITE_OTHER): Payer: Medicaid Other | Admitting: Cardiology

## 2020-10-01 ENCOUNTER — Other Ambulatory Visit: Payer: Self-pay

## 2020-10-01 ENCOUNTER — Encounter: Payer: Self-pay | Admitting: Cardiology

## 2020-10-01 VITALS — BP 164/84 | HR 69 | Ht 72.0 in | Wt 146.8 lb

## 2020-10-01 DIAGNOSIS — I255 Ischemic cardiomyopathy: Secondary | ICD-10-CM

## 2020-10-01 MED ORDER — ENTRESTO 97-103 MG PO TABS: 1.0000 | ORAL_TABLET | Freq: Two times a day (BID) | ORAL | 2 refills | Status: AC

## 2020-10-01 MED ORDER — METOPROLOL SUCCINATE ER 50 MG PO TB24: 50.0000 mg | ORAL_TABLET | Freq: Every day | ORAL | 2 refills | Status: DC

## 2020-10-01 NOTE — Patient Instructions (Addendum)
Medication Instructions:  Your physician has recommended you make the following change in your medication:  INCREASE Entresto to 97/103 mg twice daily INCREASE Toprol to 50 mg once daily   *If you need a refill on your cardiac medications before your next appointment, please call your pharmacy*   Lab Work: None ordered   Testing/Procedures: None ordered   Follow-Up: At [facility], you and your health needs are our priority.  As part of our continuing mission to provide you with exceptional heart care, we have created designated Provider Care Teams.  These Care Teams include your primary Cardiologist (physician) and Advanced Practice Providers (APPs -  Physician Assistants and Nurse Practitioners) who all work together to provide you with the care you need, when you need it.  We recommend signing up for the patient portal called "MyChart".  Sign up information is provided on this After Visit Summary.  MyChart is used to connect with patients for Virtual Visits (Telemedicine).  Patients are able to view lab/test results, encounter notes, upcoming appointments, etc.  Non-urgent messages can be sent to your provider as well.   To learn more about what you can do with MyChart, go to [email or web address].    Remote monitoring is used to monitor your Pacemaker or ICD from home. This monitoring reduces the number of office visits required to check your device to one time per year. It allows [place] to keep an eye on the functioning of your device to ensure it is working properly. You are scheduled for a device check from home on 12/06/2020. You may send your transmission at any time that day. If you have a wireless device, the transmission will be sent automatically. After your physician reviews your transmission, you will receive a postcard with your next transmission date.  Your next appointment:   9 month(s)  The format for your next appointment:   In Person  Provider:   Allegra Lai,  MD   Thank you for choosing [facility]!!   Trinidad Curet, RN [phone]    Other Instructions

## 2020-10-01 NOTE — Addendum Note (Signed)
Addended by: Stanton Kidney on: 10/01/2020 02:40 PM   Modules accepted: Orders

## 2020-11-08 ENCOUNTER — Other Ambulatory Visit: Payer: Self-pay

## 2020-11-08 ENCOUNTER — Ambulatory Visit
Admission: RE | Admit: 2020-11-08 | Discharge: 2020-11-08 | Disposition: A | Discharge status: Home / Self Care | Payer: Medicaid Other | Source: Ambulatory Visit | Attending: Radiation Oncology | Admitting: Radiation Oncology

## 2020-11-08 VITALS — BP 129/88 | HR 77 | Temp 96.5°F | Resp 16 | Wt 147.1 lb

## 2020-11-08 DIAGNOSIS — C778 Secondary and unspecified malignant neoplasm of lymph nodes of multiple regions: Secondary | ICD-10-CM | POA: Diagnosis not present

## 2020-11-08 DIAGNOSIS — C6211 Malignant neoplasm of descended right testis: Secondary | ICD-10-CM | POA: Insufficient documentation

## 2020-11-08 DIAGNOSIS — C61 Malignant neoplasm of prostate: Secondary | ICD-10-CM | POA: Insufficient documentation

## 2020-11-08 DIAGNOSIS — Z923 Personal history of irradiation: Secondary | ICD-10-CM | POA: Insufficient documentation

## 2020-11-08 DIAGNOSIS — C801 Malignant (primary) neoplasm, unspecified: Secondary | ICD-10-CM

## 2020-11-08 NOTE — Progress Notes (Signed)
Radiation Oncology Follow up Note  Name: Daniel Weiss   Date:   11/08/2020 MRN:  [id number] DOB: [birth year]    This 60 y.o. 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  Seen today in routine follow-up he is doing fairly well he specifically denies any abdominal complaints pain any increased lower urinary tract symptoms or diarrhea..  Patient also has a history of prostate cancer stage IV he is currently on Eligard every 6 months.  PSA currently is 0.96.  Patient has a chest CT in October.  COMPLICATIONS OF TREATMENT: none  FOLLOW UP COMPLIANCE: keeps appointments   PHYSICAL EXAM:  BP 129/88   Pulse 77   Temp (!) 96.5 F (35.8 C) (Tympanic)   Resp 16   Wt 147 lb 1.6 oz (66.7 kg)   BMI 19.95 kg/m  Well-developed well-nourished patient in NAD. HEENT reveals PERLA, EOMI, discs not visualized.  Oral cavity is clear. No oral mucosal lesions are identified. Neck is clear without evidence of cervical or supraclavicular adenopathy. Lungs are clear to A&P. Cardiac examination is essentially unremarkable with regular rate and rhythm without murmur rub or thrill. Abdomen is benign with no organomegaly or masses noted. Motor sensory and DTR levels are equal and symmetric in the upper and lower extremities. Cranial nerves II through XII are grossly intact. Proprioception is intact. No peripheral adenopathy or edema is identified. No motor or sensory levels are noted. Crude visual fields are within normal range.  RADIOLOGY RESULTS: No current films to review  PLAN: Present time patient is doing well low side effect profile.  Does have stage IV prostate cancer as well as recent treatment for seminoma.  He continues close follow-up care with  medical oncology.  He has a scheduled CT scan in October which I will review when it becomes available.  I have otherwise asked to see him back in 6 months for follow-up.  Patient knows to call with any concerns.  I would like to take this opportunity to thank you for allowing me to participate in the care of your patient..Noreene Filbert MD

## 2020-11-30 ENCOUNTER — Other Ambulatory Visit: Payer: Self-pay

## 2020-11-30 ENCOUNTER — Ambulatory Visit: Payer: Medicaid Other | Admitting: Cardiology

## 2020-12-06 ENCOUNTER — Ambulatory Visit: Payer: Medicaid Other

## 2020-12-19 ENCOUNTER — Ambulatory Visit
Admission: RE | Admit: 2020-12-19 | Discharge: 2020-12-19 | Disposition: A | Discharge status: Home / Self Care | Payer: Medicaid Other | Source: Ambulatory Visit | Attending: Oncology | Admitting: Oncology

## 2020-12-19 ENCOUNTER — Other Ambulatory Visit: Payer: Medicaid Other

## 2020-12-19 ENCOUNTER — Inpatient Hospital Stay: Payer: Medicaid Other | Attending: Oncology

## 2020-12-19 ENCOUNTER — Other Ambulatory Visit: Payer: Self-pay

## 2020-12-19 DIAGNOSIS — C801 Malignant (primary) neoplasm, unspecified: Secondary | ICD-10-CM | POA: Diagnosis not present

## 2020-12-19 DIAGNOSIS — Z923 Personal history of irradiation: Secondary | ICD-10-CM | POA: Insufficient documentation

## 2020-12-19 DIAGNOSIS — Z79899 Other long term (current) drug therapy: Secondary | ICD-10-CM | POA: Insufficient documentation

## 2020-12-19 DIAGNOSIS — C61 Malignant neoplasm of prostate: Secondary | ICD-10-CM | POA: Diagnosis present

## 2020-12-19 DIAGNOSIS — C7951 Secondary malignant neoplasm of bone: Secondary | ICD-10-CM | POA: Insufficient documentation

## 2020-12-19 LAB — LACTATE DEHYDROGENASE: LDH: 156 U/L (ref 98–192)

## 2020-12-19 LAB — CBC WITH DIFFERENTIAL/PLATELET
Abs Immature Granulocytes: 0.02 10*3/uL (ref 0.00–0.07)
Basophils Absolute: 0 10*3/uL (ref 0.0–0.1)
Basophils Relative: 1 %
Eosinophils Absolute: 0 10*3/uL (ref 0.0–0.5)
Eosinophils Relative: 0 %
HCT: 31.2 % — ABNORMAL LOW (ref 39.0–52.0)
Hemoglobin: 10.6 g/dL — ABNORMAL LOW (ref 13.0–17.0)
Immature Granulocytes: 1 %
Lymphocytes Relative: 24 %
Lymphs Abs: 1 10*3/uL (ref 0.7–4.0)
MCH: 27.2 pg (ref 26.0–34.0)
MCHC: 34 g/dL (ref 30.0–36.0)
MCV: 80 fL (ref 80.0–100.0)
Monocytes Absolute: 0.7 10*3/uL (ref 0.1–1.0)
Monocytes Relative: 15 %
Neutro Abs: 2.6 10*3/uL (ref 1.7–7.7)
Neutrophils Relative %: 59 %
Platelets: 365 10*3/uL (ref 150–400)
RBC: 3.9 MIL/uL — ABNORMAL LOW (ref 4.22–5.81)
RDW: 20 % — ABNORMAL HIGH (ref 11.5–15.5)
WBC: 4.4 10*3/uL (ref 4.0–10.5)
nRBC: 0 % (ref 0.0–0.2)

## 2020-12-19 LAB — COMPREHENSIVE METABOLIC PANEL
ALT: 17 U/L (ref 0–44)
AST: 28 U/L (ref 15–41)
Albumin: 3.9 g/dL (ref 3.5–5.0)
Alkaline Phosphatase: 69 U/L (ref 38–126)
Anion gap: 10 (ref 5–15)
BUN: 6 mg/dL (ref 6–20)
CO2: 23 mmol/L (ref 22–32)
Calcium: 9.2 mg/dL (ref 8.9–10.3)
Chloride: 105 mmol/L (ref 98–111)
Creatinine, Ser: 0.74 mg/dL (ref 0.61–1.24)
GFR, Estimated: >60 mL/min (ref >60)
Glucose, Bld: 100 mg/dL — ABNORMAL HIGH (ref 70–99)
Potassium: 3.8 mmol/L (ref 3.5–5.1)
Sodium: 138 mmol/L (ref 135–145)
Total Bilirubin: 0.3 mg/dL (ref 0.3–1.2)
Total Protein: 7.8 g/dL (ref 6.5–8.1)

## 2020-12-19 LAB — PSA: Prostatic Specific Antigen: 0.87 ng/mL (ref 0.00–4.00)

## 2020-12-19 MED ORDER — IOHEXOL 350 MG/ML SOLN
85.0000 mL | Freq: Once | INTRAVENOUS | Status: AC | PRN
  Administered 2020-12-19: 85 mL via INTRAVENOUS

## 2020-12-20 LAB — AFP TUMOR MARKER: AFP, Serum, Tumor Marker: 3.9 ng/mL (ref 0.0–8.4)

## 2020-12-20 LAB — BETA HCG QUANT (REF LAB): hCG Quant: <1 m[IU]/mL (ref 0–3)

## 2020-12-21 ENCOUNTER — Inpatient Hospital Stay (HOSPITAL_BASED_OUTPATIENT_CLINIC_OR_DEPARTMENT_OTHER): Payer: Medicaid Other | Admitting: Oncology

## 2020-12-21 ENCOUNTER — Other Ambulatory Visit: Payer: Self-pay

## 2020-12-21 ENCOUNTER — Encounter: Payer: Self-pay | Admitting: Oncology

## 2020-12-21 VITALS — BP 168/86 | HR 73 | Temp 98.1°F | Wt 151.3 lb

## 2020-12-21 DIAGNOSIS — C7951 Secondary malignant neoplasm of bone: Secondary | ICD-10-CM | POA: Diagnosis not present

## 2020-12-21 DIAGNOSIS — D649 Anemia, unspecified: Secondary | ICD-10-CM | POA: Diagnosis not present

## 2020-12-21 DIAGNOSIS — C801 Malignant (primary) neoplasm, unspecified: Secondary | ICD-10-CM | POA: Diagnosis not present

## 2020-12-21 DIAGNOSIS — Z923 Personal history of irradiation: Secondary | ICD-10-CM | POA: Diagnosis not present

## 2020-12-21 DIAGNOSIS — Z79818 Long term (current) use of other agents affecting estrogen receptors and estrogen levels: Secondary | ICD-10-CM

## 2020-12-21 DIAGNOSIS — C61 Malignant neoplasm of prostate: Secondary | ICD-10-CM

## 2020-12-21 DIAGNOSIS — Z79899 Other long term (current) drug therapy: Secondary | ICD-10-CM | POA: Diagnosis not present

## 2020-12-21 NOTE — Progress Notes (Signed)
Hematology/Oncology follow up note [facility] Telephone:([phone] [phone]   Patient Care Team: Azzie Glatter, FNP (Inactive) as PCP - General (Family Medicine) [name], MD as PCP - Cardiology (Cardiology) Constance Haw, MD as PCP - Electrophysiology (Cardiology) Alyson Ingles Candee Furbish, MD as Consulting Physician (Urology)  REFERRING PROVIDER: No ref. provider found  CHIEF COMPLAINTS/REASON FOR VISIT:  Follow up for prostate cancer and germ cell tumor  HISTORY OF PRESENTING ILLNESS:   Daniel Weiss is a  60 y.o.  male with PMH listed below was seen in consultation at the request of  No ref. provider found  for evaluation of presumed prostate cancer Patient recently switched his care to [facility] and was seen by Dr. [name]. He was previously followed up at [facility] urology I reviewed Dr. Dene Gentry note. Patient was diagnosed with presumed prostate cancer on 10/2018 with a PSA of 509, incidental findings of multiple sclerotic lesions on CT-chest abdomen pelvis during work-up for a CVA.  His previous urology care was with Dr. Alyson Ingles. 11/05/2018 bone scan showed solitary punctate focus of activity in the left first sacral segment corresponding to a 9 mm mixed lytic and sclerotic metastatic's on recent CT.  The remaining numerous sclerotic osseous metastasis identified on CT do not demonstrate abnormal activity and are therefore healed.  At that time No tissue diagnosis/biopsy was obtained. Patient was started with Lupron and Casodex Lupron was eventually to Eligard [facility], PSA was 13.13, testosterone level less than 10. Casodex was discontinued in June 2021.   10/26/2019 patient switched to [facility] and was seen by Dr. [name] Per urology note, PSA trend: 11/03/2018: 502 11/22/2018: 377 02/21/2019: 387 04/25/2019: 123 09/05/2019: 13.3  Patient gets  androgen deprivation therapy through Dr. Dene Gentry office.  Eligard 45mg   on 11/11/2019 -Patient was referred to heme-onc on 11/01/2019.  10/06/2019 bone scan showed resolution of punctated activity over the left sacrum.  The punctate area of increased activity over the right lower lumbar spine again noted. 10/31/2019, CT angio chest aorta showed no evidence of aortic aneurysm or dissection.  Atherosclerotic calcification spleen seen in the upper abdominal aorta.  Chronic artery disease.  No acute cardiopulmonary disease. Patient has a chronic right neck mass.  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   11/18/2019 CT abdomen pelvis with contrast showed retrocaval lymphadenopathy in the right common and external iliac chains.  Stable to minimally progressed in the interval since last CT scan 11/03/2018 Numerous sclerotic bone lesions.  Stable 3 mm subpleural right middle lobe lung nodule stable.  Aortic atherosclerosis   Patient has extensive cardiology issues.  He follows up with Dr. Ellyn Hack. Patient has history of STEMI-PCI to LAD with resultant ischemia cardiomyopathy, EF 35 to 40%, left bundle branch block with acute CVA-10/2018.  CT coronary showed nonobstructing plaquing of coronary distributions, intimal irregularity with filling defect in the ascending aorta but no LV thrombus.  And the patient is on warfarin and Plavix.  Patient was seen by thoracic surgeon Dr. Roxy Manns will recommend patient to be on Coumadin for minimum of 3 months.  With presentation with stroke, Dr. Ellyn Hack recommend at least 6 to 12 months of Coumadin. -Coumadin was discontinued by Dr. Ellyn Hack in September 2021 and switched to.  Plavix Patient lives with his sister.  He has 3 adult children.  His activity is quite limited due to shortness of breath with exertion due to cardiology problems.  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 Tumor markers Showed normal LDH, beta hCG, AFP # 01/17/2020 scrotum ultrasound showed a small right testicular mass measuring 1.6 cm, contained an internal calcification.  Appearance was consistent with primary testicular neoplasm.  Normal size and appearance of the left testicle.  #Prostate cancer, metastatic M1 A.  On androgen deprivation therapy.  Eligard through Dr. Dene Gentry office. 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  #01/17/2020 right orchiectomy residual germ cell tumor and germ cell neoplasia in situ are not identified.  Case was discussed on tumor board.  Consensus reached a point although residual germ cell tumor/infection was not identified, the presence of scar with extensive tubular atrophy is compatible with a completely regressed germ cell tumor.  He went to [facility] for second opinion as I recommended. Due to long waiting time, he left without being seen. Dr.Rush from [facility] called and agrees with patient current treatment.  11/11/2019 Eligard 45mg   on  Dr. Dene Gentry office missed his appointment with urologist on 05/11/2019 for Eligard treatments.  03/14/20 PET was reviewed and discussed with patient. - small hypermetabolic lymph nodes along the RIGHT iliac vessels is most consistent with nodal metastasis. 04/12/2020- 05/08/2020  radiation to periaortic lymph nodes as well as right hemipelvis node. 06/08/2020 Eligard 45mg  at cancer center  # Parotid mass, radiographically stable, never officially evaluated.  Patient has an ENT evaluation.  Status post biopsy- WARTHIN TUMOR.  Negative for atypia and malignancy.  # He has had CRT-D implant. INTERVAL HISTORY Daniel Weiss is a 60 y.o. male who has above history  reviewed by me today presents for follow up visit for management of metastatic prostate cancer and germ cell tumor.  Chronic SOB, stable. No chest pain, abdominal pain. Appetite is fair. Gained 5 pound since July 2022. He has no new complaints.   Review of Systems  Constitutional:  Negative for appetite change, chills, fatigue, fever and unexpected weight change.  HENT:   Negative for hearing loss and voice change.   Eyes:  Negative for eye problems and icterus.  Respiratory:  Positive for shortness of breath. Negative for chest tightness and cough.   Cardiovascular:  Negative for chest pain and leg swelling.  Gastrointestinal:  Negative for abdominal distention and abdominal pain.  Endocrine: Negative for hot flashes.  Genitourinary:  Negative for difficulty urinating, dysuria and frequency.   Musculoskeletal:  Negative for arthralgias.  Skin:  Negative for itching and rash.  Neurological:  Negative for dizziness, light-headedness and numbness.  Hematological:  Negative for adenopathy. Does not bruise/bleed easily.  Psychiatric/Behavioral:  Negative for confusion.    MEDICAL HISTORY:  Past Medical History:  Diagnosis Date   Abnormal chest CT 02/2018   a. Ectatic 3 cm Ao arch - rec f/u outpt imaging w/ CTA or MRA. Ectatic atheromatous abd Ao @ risk for aneurysm - rec f/u u/s in 5 yrs. Marked prostatic enlargement w/ scattered small sclerotic foci in the thoracic lower lumbar spine, sacrum, left 12th rib, pelvis, and right femur.  Recommend elective outpatient whole-body bone scintigraphy and PSA.   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 02/2018 ACS/PCI: LM nl, LAD 85p (3.5x15 Sierra DES), D1 45ost, RI 55ost, RCA nl, EF 25-35%.    Cardiac murmur    Chest pain on exertion 11/28/2019   Chronic combined systolic and diastolic CHF, NYHA class 2 ([place]) 04/17/2020   Complete left bundle branch block (LBBB) 2016   Coronary artery disease involving native coronary artery of native heart with angina pectoris ([place]) 03/10/2018   Dyspnea    Encephalopathy, hypertensive    Essential hypertension 07/07/2014   Germ cell tumor ([place]) 12/22/2019   Goals of care, counseling/discussion 12/22/2019   Hematemesis without nausea    HFrEF (heart failure with reduced ejection fraction) ([place])    History of CVA (cerebrovascular accident) 11/23/2018   History of non-ST elevation myocardial infarction (NSTEMI) 02/21/2019   Hypertension    Hypokalemia    Hyponatremia 06/29/2015   Ischemic cardiomyopathy 02/2018   a. 02/2018 Echo: EF 35-40% (LV gram on cath was 25-30%), mod, diff HK. mid-apicalanteroseptal, ant, and apical sev HK. RVH.-->  No notable change on echo from June 2020 and August 2020.   Localized swelling, mass or lump of neck    Long term (current) use of anticoagulants 11/16/2018   Malnutrition of moderate degree 11/11/2018   Mass of right side of neck 07/07/2014   Myocardial infarction [facility])    Neck mass    Right Neck mass - US neck showed 6.3 cm complex, partially cystic, partially solid mass.  CT neck showed 2 cm mass from parotid gland on right, with 3x4x5cm  Possible metastatic lymphadenopathy.  we recommended following up with Dr. Redmond Baseman ENT for biopsy of this.      Non-ST elevation (NSTEMI) myocardial infarction ([place]) 03/06/2018   NSTEMI (non-ST elevated myocardial infarction) ([place]) 02/2018   85% pLAD - DES PCI    Paresthesia 07/07/2014   Prostate cancer ([place]) 03/10/2018   Prostate cancer metastatic to multiple sites [facility]) 11/2018   Right sided weakness 07/07/2014   Stroke [facility])    TIA (transient ischemic attack) 2016   Warthin's tumor     SURGICAL HISTORY: Past Surgical History:  Procedure Laterality Date   BIV ICD INSERTION CRT-D  N/A 06/06/2020   Procedure: BIV ICD INSERTION CRT-D;  Surgeon: Constance Haw, MD;  Location: [place] CV LAB;  Service: Cardiovascular;  Laterality: N/A;   COLONOSCOPY WITH PROPOFOL N/A 02/27/2020   Procedure: COLONOSCOPY WITH PROPOFOL;  Surgeon: Yetta Flock, MD;  Location: [place];  Service: Gastroenterology;  Laterality: N/A;   CORONARY STENT INTERVENTION N/A 03/06/2018   Procedure: CORONARY STENT INTERVENTION;  Surgeon: [name], MD;  Location: [place] CV LAB;  Service: Cardiovascular: Proximal LAD 85% stenosis (and D1): DES PCI with Xience Sierra DES 3.5 mm x 15 mm - 3.9 mm   ESOPHAGOGASTRODUODENOSCOPY (EGD) WITH PROPOFOL N/A 02/27/2020   Procedure: ESOPHAGOGASTRODUODENOSCOPY (EGD) WITH PROPOFOL;  Surgeon: Yetta Flock, MD;  Location: [facility] ENDOSCOPY;  Service: Gastroenterology;  Laterality: N/A;   HEMOSTASIS CLIP PLACEMENT  02/27/2020   Procedure: HEMOSTASIS CLIP PLACEMENT;  Surgeon: Yetta Flock, MD;  Location: [place] ENDOSCOPY;  Service: Gastroenterology;;   HOT HEMOSTASIS N/A 02/27/2020   Procedure: HOT HEMOSTASIS (ARGON PLASMA COAGULATION/BICAP);  Surgeon: Yetta Flock, MD;  Location: [facility] ENDOSCOPY;  Service: Gastroenterology;  Laterality: N/A;  INTRAOPERATIVE TRANSTHORACIC ECHOCARDIOGRAM  03/06/2018   (Peri-MI) mild reduced EF 35-40%.  Diffuse hypokinesis (severe HK of the mid-apical anteroseptal, anterior and apical myocardium consistent with LAD infarct).  Unable to assess diastolic function.  Paradoxical septal motion likely related to his LBBB.  RV hypertrophy noted.  Trivial pericardial effusion noted.    LEFT HEART CATH AND CORONARY ANGIOGRAPHY N/A 03/06/2018   Procedure: LEFT HEART CATH AND CORONARY ANGIOGRAPHY;  Surgeon: [name], MD;  Location: [place] CV LAB;  Service: Cardiovascular: Proximal LAD 85% involving D1 45%.  Ostial RI 55%.  Severe LV dysfunction EF 25 to 30%.  1+ MR.  Only minimally elevated LVEDP.    ORCHIECTOMY Right 01/17/2020   Procedure: ORCHIECTOMY;  Surgeon: Abbie Sons, MD;  Location: [facility] ORS;  Service: Urology;  Laterality: Right;   POLYPECTOMY  02/27/2020   Procedure: POLYPECTOMY;  Surgeon: Yetta Flock, MD;  Location: [facility] ENDOSCOPY;  Service: Gastroenterology;;   PROSTATE BIOPSY N/A 01/17/2020   Procedure: BIOPSY TRANSRECTAL ULTRASONIC PROSTATE (TUBP);  Surgeon: Abbie Sons, MD;  Location: [facility] ORS;  Service: Urology;  Laterality: N/A;   TRANSTHORACIC ECHOCARDIOGRAM  10/2018   (Most recent echo, to evaluate for TIA/CVA) : Moderate reduced EF 35 to 40%.  Moderate concentric LVH.  Mild dyskinesis of the apex and severe hypokinesis of mid apical anteroseptal and anterior wall.  Unable to assess RV pressures.  Aortic sclerosis with no stenosis.  No significant change seen    SOCIAL HISTORY: Social History   Socioeconomic History   Marital status: Legally Separated    Spouse name: Dorothenia   Number of children: Not on file   Years of education: Not on file   Highest education level: Not on file  Occupational History   Occupation: cook  Tobacco Use   Smoking status: Some Days    Packs/day: 0.00    Years: 0.00    Pack years: 0.00    Types: Cigarettes   Smokeless tobacco: Never   Tobacco comments:    quit three days ago  Vaping Use   Vaping Use: Never used  Substance and Sexual Activity   Alcohol use: Yes    Alcohol/week: 2.0 standard drinks    Types: 2 Cans of beer per week    Comment: 2 Beers daily.   Drug use: Yes    Frequency: 3.0 times per week    Types: Marijuana   Sexual activity: Not Currently  Other Topics Concern   Not on file  Social History Narrative   Currently not working.  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  Now has cancer.   Social Determinants of Health   Financial Resource Strain: Not on file  Food Insecurity: Not on file  Transportation Needs: Not on file  Physical Activity: Not on file   Stress: Not on file  Social Connections: Not on file  Intimate Partner Violence: Not on file    FAMILY HISTORY: Family History  Problem Relation Age of Onset   Stroke Mother    Hypertension Mother    Diabetes type II Mother    Leukemia Other    Breast cancer Other    Stroke Brother    CAD Neg Hx     ALLERGIES:  has No Known Allergies.  MEDICATIONS:  Current Outpatient Medications  Medication Sig Dispense Refill   albuterol (VENTOLIN HFA) 108 (90 Base) MCG/ACT inhaler Inhale 2 puffs into the lungs every 6 (six) hours  as needed for wheezing or shortness of breath. 8 g 11   clopidogrel (PLAVIX) 75 MG tablet Take 1 tablet (75 mg total) by mouth daily.     metoprolol succinate (TOPROL-XL) 50 MG 24 hr tablet Take 1 tablet (50 mg total) by mouth daily. Take with or immediately following a meal. 90 tablet 2   rosuvastatin (CRESTOR) 40 MG tablet Take 1 tablet by mouth once daily 30 tablet 1   sacubitril-valsartan (ENTRESTO) 97-103 MG Take 1 tablet by mouth 2 (two) times daily. 180 tablet 2   traZODone (DESYREL) 50 MG tablet Take 0.5-1 tablets (25-50 mg total) by mouth at bedtime as needed for sleep. 30 tablet 6   venlafaxine XR (EFFEXOR XR) 37.5 MG 24 hr capsule Take 1 capsule (37.5 mg total) by mouth daily with breakfast. 30 capsule 1   bicalutamide (CASODEX) 50 MG tablet Take 1 tablet by mouth twice daily (Patient not taking: Reported on 12/21/2020) 60 tablet 0   ezetimibe (ZETIA) 10 MG tablet Take 1 tablet (10 mg total) by mouth daily. 90 tablet 3   furosemide (LASIX) 20 MG tablet May take 20 mg tablet as needed for increase shortness of breath or swelling (Patient not taking: No sig reported) 20 tablet 6   nitroGLYCERIN (NITROSTAT) 0.4 MG SL tablet Place 1 tablet (0.4 mg total) under the tongue every 5 (five) minutes as needed for chest pain. (Patient not taking: No sig reported) 25 tablet 3   ondansetron (ZOFRAN) 4 MG tablet Take 4 mg by mouth every 4 (four) hours as needed for nausea  or vomiting.  (Patient not taking: No sig reported)     spironolactone (ALDACTONE) 25 MG tablet Take 1 tablet (25 mg total) by mouth daily. 90 tablet 3   tamsulosin (FLOMAX) 0.4 MG CAPS capsule Take 1 capsule (0.4 mg total) by mouth daily after supper. (Patient not taking: Reported on 12/21/2020) 90 capsule 3   No current facility-administered medications for this visit.   Facility-Administered Medications Ordered in Other Visits  Medication Dose Route Frequency Provider Last Rate Last Admin   leuprolide (6 Month) (ELIGARD) injection 45 mg  45 mg Subcutaneous Once Earlie Server, MD         PHYSICAL EXAMINATION: ECOG PERFORMANCE STATUS: 1 - Symptomatic but completely ambulatory Vitals:   12/21/20 1151  BP: (!) 168/86  Pulse: 73  Temp: 98.1 F (36.7 C)  SpO2: 100%   Filed Weights   12/21/20 1151  Weight: 151 lb 4.8 oz (68.6 kg)    Physical Exam Constitutional:      General: He is not in acute distress.    Comments: Thin built.   HENT:     Head: Normocephalic and atraumatic.  Eyes:     General: No scleral icterus. Neck:     Comments: Palpable right cervical mass Cardiovascular:     Rate and Rhythm: Normal rate and regular rhythm.  Pulmonary:     Effort: Pulmonary effort is normal. No respiratory distress.     Breath sounds: No wheezing.  Abdominal:     General: Bowel sounds are normal. There is no distension.     Palpations: Abdomen is soft.  Musculoskeletal:        General: No deformity. Normal range of motion.     Cervical back: Normal range of motion and neck supple.  Skin:    General: Skin is warm and dry.     Findings: No erythema or rash.  Neurological:     Mental Status: He is  alert and oriented to person, place, and time. Mental status is at baseline.     Cranial Nerves: No cranial nerve deficit.     Coordination: Coordination normal.  Psychiatric:        Mood and Affect: Mood normal.    LABORATORY DATA:  I have reviewed the data as listed Lab Results   Component Value Date   WBC 4.4 12/19/2020   HGB 10.6 (L) 12/19/2020   HCT 31.2 (L) 12/19/2020   MCV 80.0 12/19/2020   PLT 365 12/19/2020   Recent Labs    06/01/20 1300 09/17/20 1138 12/19/20 0904  NA 132* 133* 138  K 3.7 3.9 3.8  CL 100 99 105  CO2 21* 23 23  GLUCOSE 83 84 100*  BUN 8 11 6   CREATININE 0.84 0.72 0.74  CALCIUM 9.3 9.3 9.2  GFRNONAA >60 >60 >60  PROT 7.5 7.9 7.8  ALBUMIN 3.8 3.9 3.9  AST 21 31 28   ALT 12 20 17   ALKPHOS 61 75 69  BILITOT 0.3 <0.1* 0.3    Iron/TIBC/Ferritin/ %Sat No results found for: IRON, TIBC, FERRITIN, IRONPCTSAT    RADIOGRAPHIC STUDIES: I have personally reviewed the radiological images as listed and agreed with the findings in the report. CT CHEST ABDOMEN PELVIS W CONTRAST  Result Date: 12/20/2020 CLINICAL DATA:  Prostate cancer.  Testicular cancer. EXAM: CT CHEST, ABDOMEN, AND PELVIS WITH CONTRAST TECHNIQUE: Multidetector CT imaging of the chest, abdomen and pelvis was performed following the standard protocol during bolus administration of intravenous contrast. CONTRAST:  89mL OMNIPAQUE IOHEXOL 350 MG/ML SOLN COMPARISON:  PET-CT 03/14/2020. Abdomen/pelvis CT 11/18/2019 FINDINGS: CT CHEST FINDINGS Cardiovascular: The heart size is normal. No substantial pericardial effusion. Coronary artery calcification is evident. Mild atherosclerotic calcification is noted in the wall of the thoracic aorta. Left permanent pacemaker noted. Mediastinum/Nodes: No mediastinal lymphadenopathy. There is no hilar lymphadenopathy. The esophagus has normal imaging features. There is no axillary lymphadenopathy. Lungs/Pleura: 4 mm subpleural right middle lobe nodule on 01/05/3 is stable. No new suspicious pulmonary nodule or mass. No focal airspace consolidation. No pleural effusion. Musculoskeletal: Scattered small sclerotic lesions in the thoracic spine are stable in the interval. CT ABDOMEN PELVIS FINDINGS Hepatobiliary: Tiny hypodensities in the left liver on  images 56 and 57 of series 2 are stable in the interval since 03/14/2020. 15 mm low-density lesion medial right liver on 63/2 is stable since prior and comparing back to 11/18/2019. 13 mm low-density lesion in the right liver on 66/2 shows no substantial change since 11/18/2019 There is no evidence for gallstones, gallbladder wall thickening, or pericholecystic fluid. No intrahepatic or extrahepatic biliary dilation. Pancreas: No focal mass lesion. No dilatation of the main duct. No intraparenchymal cyst. No peripancreatic edema. Spleen: No splenomegaly. No focal mass lesion. Adrenals/Urinary Tract: No adrenal nodule or mass. 4 mm nonobstructing stone identified upper pole left kidney with a punctate interpolar right renal stone visible on coronal 66/4. No left renal stones. No hydronephrosis. No evidence for hydroureter. Bladder is decompressed. Stomach/Bowel: Stomach is unremarkable. No gastric wall thickening. No evidence of outlet obstruction. Duodenum is normally positioned as is the ligament of Treitz. No small bowel wall thickening. No small bowel dilatation. The terminal ileum is normal. The appendix is normal. No gross colonic mass. No colonic wall thickening. Vascular/Lymphatic: There is moderate atherosclerotic calcification of the abdominal aorta without aneurysm. There is no gastrohepatic or hepatoduodenal ligament lymphadenopathy. No retroperitoneal or mesenteric lymphadenopathy. The small gastrohepatic ligament lymph nodes are stable. The retrocaval and  right iliac lymphadenopathy seen on the previous PET-CT has resolved completely in the interval. 10 mm index right pelvic sidewall node remeasured on the previous study is now 3 mm short axis on image 104/2. Mild 10 mm short axis right common iliac node remeasured on the previous study has decreased to 4 mm short axis on image 93/2 today. Reproductive: The prostate gland and seminal vesicles are unremarkable. Other: No intraperitoneal free fluid.  Musculoskeletal: Stable scattered tiny sclerotic lesions in the lumbar spine and bony pelvis are stable. IMPRESSION: 1. Interval resolution of the retrocaval and right iliac lymphadenopathy seen to be hypermetabolic on the previous PET-CT. No new or progressive findings on today's study. 2. Stable 4 mm subpleural right middle lobe pulmonary nodule. Most likely benign. Continued attention on follow-up recommended. 3. Stable appearance of multiple tiny sclerotic lesions in the thoracic and lumbar spine and bony pelvis. The showed no hypermetabolism on PET imaging. 4. Nonobstructing bilateral nephrolithiasis. 5. Aortic Atherosclerosis (ICD10-I70.0). Electronically Signed   By: Misty Stanley M.D.   On: 12/20/2020 16:33      ASSESSMENT & PLAN:  1. Germ cell tumor ([place])   2. Prostate cancer ([place])   3. Use of leuprolide acetate (Lupron)   4. Normocytic anemia   Cancer Staging Prostate cancer [facility]) Staging form: Prostate, AJCC 8th Edition - Clinical stage from 01/17/2020: Stage IVB (cT2c, cN0, cM1, Grade Group: 4) - Signed by Earlie Server, MD on 03/16/2020   #Metastatic castration sensitive prostate cancer currently on androgen deprivation therapy Labs are reviewed and discussed with patient. Continue Eligard 45mg  Q6 months- will give today Patient declined additional chemotherapy or oral agent at this point,   #Germ cell tumor, stage II  Status post radiation.  Normal AFP, hCG, LDH were normal. 12/19/2020 CT chest abdomen pelvis showed interval resolution of retrocaval and right iliac lymphadenotomy. No new or progressive disease. Stable sclerotic lesions. Non obstructing bilateral nephrolithiasis.   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  Not hypermetabolic on PET scan. Monitor.   # Chronic anemia, will check retic panel, iron panel next week.   All questions were answered. The patient knows to call the clinic with any problems questions or concerns.  Return of visit: 3  months.  Earlie Server, MD, PhD 12/21/2020

## 2020-12-25 ENCOUNTER — Ambulatory Visit: Payer: Medicaid Other

## 2020-12-26 ENCOUNTER — Other Ambulatory Visit: Payer: Self-pay

## 2020-12-26 ENCOUNTER — Inpatient Hospital Stay: Payer: Medicaid Other

## 2020-12-26 DIAGNOSIS — C61 Malignant neoplasm of prostate: Secondary | ICD-10-CM

## 2020-12-26 MED ORDER — LEUPROLIDE ACETATE (6 MONTH) 45 MG ~~LOC~~ KIT
45.0000 mg | PACK | Freq: Once | SUBCUTANEOUS | Status: AC
  Administered 2020-12-26: 45 mg via SUBCUTANEOUS
  Filled 2020-12-26: qty 45

## 2020-12-28 ENCOUNTER — Other Ambulatory Visit: Payer: Self-pay | Admitting: Cardiology

## 2021-01-02 LAB — CUP PACEART REMOTE DEVICE CHECK
Battery Remaining Longevity: 70 mo
Battery Remaining Percentage: 89 %
Battery Voltage: 2.98 V
Brady Statistic AP VP Percent: 1.3 %
Brady Statistic AP VS Percent: 1 %
Brady Statistic AS VP Percent: 91 %
Brady Statistic AS VS Percent: 7.4 %
Brady Statistic RA Percent Paced: 1.2 %
Date Time Interrogation Session: 20220929020009
HighPow Impedance: 63 Ohm
Implantable Lead Implant Date: 20220330
Implantable Lead Implant Date: 20220330
Implantable Lead Implant Date: 20220330
Implantable Lead Location: 753858
Implantable Lead Location: 753859
Implantable Lead Location: 753860
Implantable Pulse Generator Implant Date: 20220330
Lead Channel Impedance Value: 340 Ohm
Lead Channel Impedance Value: 380 Ohm
Lead Channel Impedance Value: 700 Ohm
Lead Channel Pacing Threshold Amplitude: 0.5 V
Lead Channel Pacing Threshold Amplitude: 0.75 V
Lead Channel Pacing Threshold Amplitude: 1.5 V
Lead Channel Pacing Threshold Pulse Width: 0.5 ms
Lead Channel Pacing Threshold Pulse Width: 0.5 ms
Lead Channel Pacing Threshold Pulse Width: 0.5 ms
Lead Channel Sensing Intrinsic Amplitude: 11.9 mV
Lead Channel Sensing Intrinsic Amplitude: 2.5 mV
Lead Channel Setting Pacing Amplitude: 2 V
Lead Channel Setting Pacing Amplitude: 2.5 V
Lead Channel Setting Pacing Amplitude: 3 V
Lead Channel Setting Pacing Pulse Width: 0.5 ms
Lead Channel Setting Pacing Pulse Width: 0.5 ms
Lead Channel Setting Sensing Sensitivity: 0.5 mV
Pulse Gen Serial Number: 111039291

## 2021-01-15 ENCOUNTER — Other Ambulatory Visit: Payer: Self-pay | Admitting: Cardiology

## 2021-01-22 ENCOUNTER — Other Ambulatory Visit: Payer: Self-pay | Admitting: Cardiology

## 2021-01-22 NOTE — Telephone Encounter (Signed)
Responded to by other means

## 2021-01-24 NOTE — Progress Notes (Signed)
Cardiology Office Note:    Date:  01/28/2021   ID:  Daniel Weiss, DOB 1960/09/29, MRN [id number]  PCP:  Azzie Glatter, FNP (Inactive)  Cardiologist:  Glenetta Hew, MD   Referring MD: Azzie Glatter, FNP   Chief Complaint  Patient presents with   Follow-up    CAD, ICM, med refills    History of Present Illness:    Graysen Woodyard is a 60 y.o. male with a hx of anterior STEMI treated with DES to LAD in 2019, ischemic cardiomyopathy with chronic combined systolic and diastolic heart failure, LBBB, and CVA in August 2021.  Coronary CTA 10/2019 with stable noobstructive disease and patent LAD stent. Stress test in September 2021 was nonischemic.   He was hospitalized in 10/2019 found to have acute CVA and aortic thrombus on imaging. Brilinta was changed to plavix and he was started on coumadin. Imaging also notable for multiple lytic lesions consistent with metastatic prostate cancer. He has struggled with poor appetite, weight loss and orthostatic symptoms related to cancer and treatment. Due to need for prostate biopsy, coumadin was stopped due to resolution of mural thrombus on repeat imaging. Biopsy confirmed metastatic seminoma.   He was hospitalized in December 2021 with a GI bleed.  Echocardiogram at that time with LVEF of 35% with wall motion abnormality consistent with prior LAD infarction.  This is unchanged from his prior US.  Plavix was held for GI bleed.   He is status post CRT-D implanted 06/06/2020 and was last seen by Dr. Curt Bears 10/01/20.   He presents today for routine follow up. He is not taking plavix or coumadin. Per Dr. Allison Quarry note on 04/17/20, he was restarted on plavix due to ongoing chest pain. Today, he does not have chest pain. His only complaint today is shortness of breath at rest. He is chronically short of breath, but feels it has worsened today. He takes albuterol inhaler during the visit which helped his SOB.   He does report weight increase to 162 lbs  since moving to his own apartment - he is eating more now. He mostly eats processed and frozen foods. We discussed cutting back on sodium. I have advised 1800 mg sodium restriction.   He has a prominent right neck mass. Will refer to ENT in [place]. He left without being seen at [facility] and does not want to go back there.   Murmur heard best at right sternal border.   Past Medical History:  Diagnosis Date   Abnormal chest CT 02/2018   a. Ectatic 3 cm Ao arch - rec f/u outpt imaging w/ CTA or MRA. Ectatic atheromatous abd Ao @ risk for aneurysm - rec f/u u/s in 5 yrs. Marked prostatic enlargement w/ scattered small sclerotic foci in the thoracic lower lumbar spine, sacrum, left 12th rib, pelvis, and right femur.  Recommend elective outpatient whole-body bone scintigraphy and PSA.   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 02/2018 ACS/PCI: LM nl, LAD 85p (3.5x15 Sierra DES), D1 45ost, RI 55ost, RCA nl, EF 25-35%.   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 02/2018 Echo: EF 35-40% (LV gram on cath was 25-30%), mod, diff HK. mid-apicalanteroseptal, ant, and apical sev HK. RVH.-->  No notable change on echo from June 2020 and August 2020.   Localized swelling, mass or lump of neck    Long term (current) use of anticoagulants 11/16/2018   Malnutrition of moderate degree 11/11/2018   Mass of right side of neck 07/07/2014   Myocardial infarction [facility])    Neck mass    Right Neck mass - US neck showed 6.3 cm complex, partially cystic, partially solid mass.  CT neck showed 2 cm mass from parotid gland on right, with 3x4x5cm  Possible metastatic lymphadenopathy.  we recommended following up with Dr. Redmond Baseman ENT for biopsy of this.      Non-ST elevation (NSTEMI) myocardial infarction ([place]) 03/06/2018   NSTEMI (non-ST elevated myocardial infarction) ([place]) 02/2018   85% pLAD - DES PCI    Paresthesia 07/07/2014   Prostate cancer ([place]) 03/10/2018   Prostate cancer metastatic to multiple sites [facility]) 11/2018   Right sided weakness 07/07/2014   Stroke [facility])    TIA (transient ischemic attack) 2016   Warthin's tumor     Past Surgical History:  Procedure Laterality Date   BIV ICD INSERTION CRT-D N/A 06/06/2020   Procedure: BIV ICD INSERTION CRT-D;  Surgeon: Constance Haw, MD;  Location: [place] CV LAB;  Service: Cardiovascular;  Laterality: N/A;   COLONOSCOPY WITH PROPOFOL N/A 02/27/2020   Procedure: COLONOSCOPY WITH PROPOFOL;  Surgeon: Yetta Flock, MD;  Location: [place];  Service: Gastroenterology;  Laterality: N/A;   CORONARY STENT INTERVENTION N/A 03/06/2018   Procedure: CORONARY STENT INTERVENTION;  Surgeon: [name], MD;  Location: [place] CV LAB;  Service: Cardiovascular: Proximal LAD 85% stenosis (and D1): DES PCI with Xience Sierra DES 3.5 mm x 15 mm - 3.9 mm   ESOPHAGOGASTRODUODENOSCOPY (EGD) WITH PROPOFOL N/A 02/27/2020   Procedure: ESOPHAGOGASTRODUODENOSCOPY (EGD) WITH PROPOFOL;   Surgeon: Yetta Flock, MD;  [facility] ENDOSCOPY;  Service: Gastroenterology;  Laterality: N/A;   HEMOSTASIS CLIP PLACEMENT  02/27/2020   Procedure: HEMOSTASIS CLIP PLACEMENT;  Surgeon: Yetta Flock, MD;  Location: [place] ENDOSCOPY;  Service: Gastroenterology;;   HOT HEMOSTASIS N/A 02/27/2020   Procedure: HOT HEMOSTASIS (ARGON PLASMA COAGULATION/BICAP);  Surgeon: Yetta Flock, MD;  Location: [facility] ENDOSCOPY;  Service: Gastroenterology;  Laterality: N/A;   INTRAOPERATIVE TRANSTHORACIC ECHOCARDIOGRAM  03/06/2018   (Peri-MI) mild reduced EF 35-40%.  Diffuse hypokinesis (severe HK of the mid-apical anteroseptal, anterior and apical myocardium consistent with LAD infarct).  Unable to assess diastolic function.  Paradoxical septal motion likely related to his LBBB.  RV hypertrophy noted.  Trivial pericardial effusion noted.    LEFT HEART CATH AND CORONARY ANGIOGRAPHY N/A 03/06/2018   Procedure: LEFT HEART CATH AND CORONARY ANGIOGRAPHY;  Surgeon: [name], MD;  Location: [place] CV LAB;  Service: Cardiovascular: Proximal LAD 85% involving D1 45%.  Ostial RI 55%.  Severe LV dysfunction EF 25 to 30%.  1+ MR.  Only minimally elevated LVEDP.   ORCHIECTOMY Right 01/17/2020   Procedure: ORCHIECTOMY;  Surgeon: Abbie Sons, MD;  Location: [facility] ORS;  Service: Urology;  Laterality: Right;   POLYPECTOMY  02/27/2020   Procedure: POLYPECTOMY;  Surgeon: Yetta Flock, MD;  Location: [facility] ENDOSCOPY;  Service: Gastroenterology;;   PROSTATE BIOPSY N/A 01/17/2020   Procedure: BIOPSY TRANSRECTAL ULTRASONIC PROSTATE (TUBP);  Surgeon: Abbie Sons, MD;  Location: [facility] ORS;  Service: Urology;  Laterality: N/A;   TRANSTHORACIC ECHOCARDIOGRAM  10/2018   (Most recent echo, to evaluate for TIA/CVA) : Moderate reduced EF 35 to 40%.  Moderate concentric LVH.  Mild dyskinesis of the apex and severe hypokinesis of mid apical anteroseptal and anterior wall.  Unable to assess RV pressures.   Aortic sclerosis with no stenosis.  No significant change seen    Current Medications: Current Meds  Medication Sig   albuterol (VENTOLIN HFA) 108 (90 Base) MCG/ACT inhaler Inhale 2 puffs into the lungs every 6 (six) hours as needed for wheezing or shortness of breath.   bicalutamide (CASODEX) 50 MG tablet Take 1 tablet by mouth twice daily   sacubitril-valsartan (ENTRESTO) 97-103 MG Take 1 tablet by mouth 2 (two) times daily.   [DISCONTINUED] clopidogrel (PLAVIX) 75 MG tablet Take 1 tablet by mouth once daily   [DISCONTINUED] furosemide (LASIX) 20 MG tablet May take 20 mg tablet as needed for increase shortness of breath or swelling   [DISCONTINUED] nitroGLYCERIN (NITROSTAT) 0.4 MG SL tablet Place 1 tablet (0.4 mg total) under the tongue every 5 (five) minutes as needed for chest pain.   [DISCONTINUED] tamsulosin (FLOMAX) 0.4 MG CAPS capsule Take 1 capsule (0.4 mg total) by mouth daily after supper.     Allergies:   Patient has no known allergies.   Social History   Socioeconomic History   Marital status: Legally Separated    Spouse name: Dorothenia   Number of children: Not on file   Years of education: Not on file   Highest education level: Not on file  Occupational History   Occupation: cook  Tobacco Use   Smoking status: Some Days    Packs/day: 0.00    Years: 0.00    Pack years: 0.00    Types: Cigarettes   Smokeless tobacco: Never   Tobacco comments:    quit three days ago  Vaping Use   Vaping Use: Never used  Substance and Sexual Activity   Alcohol use: Yes    Alcohol/week: 2.0 standard drinks    Types: 2 Cans of beer per week    Comment: 2 Beers daily.   Drug use: Yes    Frequency: 3.0 times per week    Types: Marijuana   Sexual activity: Not Currently  Other Topics Concern   Not on file  Social History Narrative   Currently not working.  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  Now has cancer.   Social Determinants of Health    Financial Resource Strain: Not on file  Food Insecurity: Not on file  Transportation Needs: Not on file  Physical Activity: Not on file  Stress: Not on file  Social Connections: Not on file     Family History: The patient's family history includes Breast cancer in an other family member; Diabetes type II in his mother; Hypertension  in his mother; Leukemia in an other family member; Stroke in his brother and mother. There is no history of CAD.  ROS:   Please see the history of present illness.     All other systems reviewed and are negative.  EKGs/Labs/Other Studies Reviewed:    The following studies were reviewed today:  Echo 02/23/20: 1. Akinesis of the distal anteroseptal and apical walls with overall  moderate to severe LV dysfunction.   2. Left ventricular ejection fraction, by estimation, is 30 to 35%. The  left ventricle has moderate to severely decreased function. The left  ventricle demonstrates regional wall motion abnormalities (see scoring  diagram/findings for description). Left  ventricular diastolic parameters are consistent with Grade II diastolic  dysfunction (pseudonormalization).   3. Right ventricular systolic function is normal. The right ventricular  size is normal.   4. The mitral valve is normal in structure. No evidence of mitral valve  regurgitation. No evidence of mitral stenosis.   5. The aortic valve has an indeterminant number of cusps. Aortic valve  regurgitation is trivial. No aortic stenosis is present.   6. The inferior vena cava is normal in size with greater than 50%  respiratory variability, suggesting right atrial pressure of 3 mmHg.   EKG:  EKG is not ordered today.   Recent Labs: 02/23/2020: TSH 1.654 02/25/2020: Magnesium 1.9 12/19/2020: ALT 17; BUN 6; Creatinine, Ser 0.74; Hemoglobin 10.6; Platelets 365; Potassium 3.8; Sodium 138  Recent Lipid Panel    Component Value Date/Time   CHOL 180 02/23/2020 0251   CHOL 133 06/07/2019  1255   TRIG 123 02/23/2020 0251   HDL 54 02/23/2020 0251   HDL 56 06/07/2019 1255   CHOLHDL 3.3 02/23/2020 0251   VLDL 25 02/23/2020 0251   LDLCALC 101 (H) 02/23/2020 0251   LDLCALC 49 06/07/2019 1255    Physical Exam:    VS:  BP (!) 145/80   Pulse 71   Ht 6' (1.829 m)   Wt 161 lb 12.8 oz (73.4 kg)   SpO2 99%   BMI 21.94 kg/m     Wt Readings from Last 3 Encounters:  01/28/21 161 lb 12.8 oz (73.4 kg)  12/21/20 151 lb 4.8 oz (68.6 kg)  11/08/20 147 lb 1.6 oz (66.7 kg)     GEN:  Well nourished, well developed in no acute distress HEENT: large right neck mass NECK: No JVD; No carotid bruits LYMPHATICS: No lymphadenopathy CARDIAC: RRR, no murmurs, rubs, gallops RESPIRATORY:  Clear to auscultation without rales, wheezing or rhonchi  ABDOMEN: Soft, non-tender, non-distended MUSCULOSKELETAL:  No edema; No deformity  SKIN: Warm and dry NEUROLOGIC:  Alert and oriented x 3 PSYCHIATRIC:  Normal affect   ASSESSMENT:    1. Essential hypertension   2. Chronic combined systolic and diastolic CHF, NYHA class 2 ([place])   3. Ischemic cardiomyopathy   4. Hyperlipidemia LDL goal <70   5. Coronary artery disease involving native coronary artery of native heart with angina pectoris ([place])   6. History of CVA (cerebrovascular accident)   7. Shortness of breath   8. Cardiomyopathy, ischemic   9. Complete left bundle branch block (LBBB)   10. Chronic systolic heart failure ([place])   11. 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 Mass of right side of neck    PLAN:    In order of problems listed above:  CAD Hx of anterior STEMI - DES to LAD, subsequent stable CCTA  and nuclear stress tests - plavix monotherapy, also history of CVA - will re-order plavix, no chest pain   Murmur - will obtain echo   Shortness of breath - improved with albuterol inhaler - discussed sodium intake  - he will take 20 mg lasix x 3 days, then  resume PRN   Hyperlipidemia with LDL goal < 70 02/23/2020: Cholesterol 180; HDL 54; LDL Cholesterol 101; Triglycerides 123; VLDL 25 Not at goal. Maintained on  10 mg zetia, 40 mg cestor Apparently crestor was stopped and he hasn't been taking it - will restart this and obtain lipids in 6 weeks. May need referral to lipid clinic.    Ischemic cardiomyopathy Chronic systolic heart failure CRT-D in place - maintaine don 97-103 entresto, 50 mg toprol, 25 mg spironolactone, and 20 mg lasix - he is out of lasix - he has had weight gain - unclear how much is fluid and how much is calorie intake since moving into his own apartment   Metastatic prostate cancer Pre oncology.   Right neck mass Will refer back to ENT   Follow up in 6 months.     Medication Adjustments/Labs and Tests Ordered: Current medicines are reviewed at length with the patient today.  Concerns regarding medicines are outlined above.  Orders Placed This Encounter  Procedures   Lipid panel   Ambulatory referral to ENT   ECHOCARDIOGRAM COMPLETE   Meds ordered this encounter  Medications   clopidogrel (PLAVIX) 75 MG tablet    Sig: Take 1 tablet (75 mg total) by mouth daily.    Dispense:  90 tablet    Refill:  3   ezetimibe (ZETIA) 10 MG tablet    Sig: Take 1 tablet (10 mg total) by mouth daily.    Dispense:  90 tablet    Refill:  3   metoprolol succinate (TOPROL-XL) 50 MG 24 hr tablet    Sig: Take 1 tablet (50 mg total) by mouth daily. Take with or immediately following a meal.    Dispense:  90 tablet    Refill:  3   spironolactone (ALDACTONE) 25 MG tablet    Sig: Take 1 tablet (25 mg total) by mouth daily.    Dispense:  90 tablet    Refill:  3   tamsulosin (FLOMAX) 0.4 MG CAPS capsule    Sig: Take 1 capsule (0.4 mg total) by mouth daily after supper.    Dispense:  90 capsule    Refill:  3   furosemide (LASIX) 20 MG tablet    Sig: May take 20 mg tablet as needed for increase shortness of breath or  swelling    Dispense:  20 tablet    Refill:  6   rosuvastatin (CRESTOR) 40 MG tablet    Sig: Take 1 tablet (40 mg total) by mouth daily.    Dispense:  90 tablet    Refill:  3   nitroGLYCERIN (NITROSTAT) 0.4 MG SL tablet    Sig: Place 1 tablet (0.4 mg total) under the tongue every 5 (five) minutes as needed for chest pain.    Dispense:  25 tablet    Refill:  3    Signed, Ledora Bottcher, [place]  01/28/2021 12:44 PM    [facility]

## 2021-01-28 ENCOUNTER — Encounter: Payer: Self-pay | Admitting: Physician Assistant

## 2021-01-28 ENCOUNTER — Other Ambulatory Visit: Payer: Self-pay

## 2021-01-28 ENCOUNTER — Ambulatory Visit (INDEPENDENT_AMBULATORY_CARE_PROVIDER_SITE_OTHER): Payer: Medicaid Other | Admitting: Physician Assistant

## 2021-01-28 VITALS — BP 145/80 | HR 71 | Ht 72.0 in | Wt 161.8 lb

## 2021-01-28 DIAGNOSIS — I447 Left bundle-branch block, unspecified: Secondary | ICD-10-CM

## 2021-01-28 DIAGNOSIS — I5022 Chronic systolic (congestive) heart failure: Secondary | ICD-10-CM

## 2021-01-28 DIAGNOSIS — R0602 Shortness of breath: Secondary | ICD-10-CM

## 2021-01-28 DIAGNOSIS — I10 Essential (primary) hypertension: Secondary | ICD-10-CM | POA: Diagnosis not present

## 2021-01-28 DIAGNOSIS — Z95810 Presence of automatic (implantable) cardiac defibrillator: Secondary | ICD-10-CM

## 2021-01-28 DIAGNOSIS — Z8673 Personal history of transient ischemic attack (TIA), and cerebral infarction without residual deficits: Secondary | ICD-10-CM

## 2021-01-28 DIAGNOSIS — I255 Ischemic cardiomyopathy: Secondary | ICD-10-CM | POA: Diagnosis not present

## 2021-01-28 DIAGNOSIS — E785 Hyperlipidemia, unspecified: Secondary | ICD-10-CM | POA: Diagnosis not present

## 2021-01-28 DIAGNOSIS — I25119 Atherosclerotic heart disease of native coronary artery with unspecified angina pectoris: Secondary | ICD-10-CM

## 2021-01-28 DIAGNOSIS — I5042 Chronic combined systolic (congestive) and diastolic (congestive) heart failure: Secondary | ICD-10-CM

## 2021-01-28 DIAGNOSIS — C801 Malignant (primary) neoplasm, unspecified: Secondary | ICD-10-CM

## 2021-01-28 DIAGNOSIS — R221 Localized swelling, mass and lump, neck: Secondary | ICD-10-CM

## 2021-01-28 MED ORDER — ROSUVASTATIN CALCIUM 40 MG PO TABS: 40.0000 mg | ORAL_TABLET | Freq: Every day | ORAL | 3 refills | Status: AC

## 2021-01-28 MED ORDER — TAMSULOSIN HCL 0.4 MG PO CAPS: 0.4000 mg | ORAL_CAPSULE | Freq: Every day | ORAL | 3 refills | Status: AC

## 2021-01-28 MED ORDER — FUROSEMIDE 20 MG PO TABS: ORAL_TABLET | ORAL | 6 refills | Status: DC

## 2021-01-28 MED ORDER — SPIRONOLACTONE 25 MG PO TABS: 25.0000 mg | ORAL_TABLET | Freq: Every day | ORAL | 3 refills | Status: DC

## 2021-01-28 MED ORDER — METOPROLOL SUCCINATE ER 50 MG PO TB24: 50.0000 mg | ORAL_TABLET | Freq: Every day | ORAL | 3 refills | Status: DC

## 2021-01-28 MED ORDER — CLOPIDOGREL BISULFATE 75 MG PO TABS: 75.0000 mg | ORAL_TABLET | Freq: Every day | ORAL | 3 refills | Status: DC

## 2021-01-28 MED ORDER — EZETIMIBE 10 MG PO TABS: 10.0000 mg | ORAL_TABLET | Freq: Every day | ORAL | 3 refills | Status: DC

## 2021-01-28 MED ORDER — NITROGLYCERIN 0.4 MG SL SUBL
0.4000 mg | SUBLINGUAL_TABLET | SUBLINGUAL | 3 refills | Status: AC | PRN
Start: 2021-01-28 — End: ?

## 2021-01-28 NOTE — Patient Instructions (Signed)
Medication Instructions:   -Take furosemide (lasix) for 3 consecutive days then resume taking as needed.  *If you need a refill on your cardiac medications before your next appointment, please call your pharmacy*   Lab Work: Your physician recommends that you return for lab work in: next week or so for FASTING cholesterol levels.  If you have labs (blood work) drawn today and your tests are completely normal, you will receive your results only by: [place] (if you have MyChart) OR A paper copy in the mail If you have any lab test that is abnormal or we need to change your treatment, we will call you to review the results.   Testing/Procedures: Your physician has requested that you have an echocardiogram. Echocardiography is a painless test that uses sound waves to create images of your heart. It provides your doctor with information about the size and shape of your heart and how well your heart's chambers and valves are working. This procedure takes approximately one hour. There are no restrictions for this procedure.   Follow-Up: At [facility], you and your health needs are our priority.  As part of our continuing mission to provide you with exceptional heart care, we have created designated Provider Care Teams.  These Care Teams include your primary Cardiologist (physician) and Advanced Practice Providers (APPs -  Physician Assistants and Nurse Practitioners) who all work together to provide you with the care you need, when you need it.  We recommend signing up for the patient portal called "MyChart".  Sign up information is provided on this After Visit Summary.  MyChart is used to connect with patients for Virtual Visits (Telemedicine).  Patients are able to view lab/test results, encounter notes, upcoming appointments, etc.  Non-urgent messages can be sent to your provider as well.   To learn more about what you can do with MyChart, go to [email or web address].    Your next  appointment:   6 month(s)  The format for your next appointment:   In Person  Provider:   Glenetta Hew, MD

## 2021-03-07 ENCOUNTER — Ambulatory Visit (INDEPENDENT_AMBULATORY_CARE_PROVIDER_SITE_OTHER): Payer: Medicaid Other

## 2021-03-07 DIAGNOSIS — I255 Ischemic cardiomyopathy: Secondary | ICD-10-CM

## 2021-03-07 LAB — CUP PACEART REMOTE DEVICE CHECK
Battery Remaining Longevity: 67 mo
Battery Remaining Percentage: 85 %
Battery Voltage: 2.98 V
Brady Statistic AP VP Percent: 2.1 %
Brady Statistic AP VS Percent: 1 %
Brady Statistic AS VP Percent: 91 %
Brady Statistic AS VS Percent: 6.4 %
Brady Statistic RA Percent Paced: 2 %
Date Time Interrogation Session: 20221229010100
HighPow Impedance: 65 Ohm
Implantable Lead Implant Date: 20220330
Implantable Lead Implant Date: 20220330
Implantable Lead Implant Date: 20220330
Implantable Lead Location: 753858
Implantable Lead Location: 753859
Implantable Lead Location: 753860
Implantable Pulse Generator Implant Date: 20220330
Lead Channel Impedance Value: 350 Ohm
Lead Channel Impedance Value: 390 Ohm
Lead Channel Impedance Value: 730 Ohm
Lead Channel Pacing Threshold Amplitude: 0.5 V
Lead Channel Pacing Threshold Amplitude: 0.75 V
Lead Channel Pacing Threshold Amplitude: 1.5 V
Lead Channel Pacing Threshold Pulse Width: 0.5 ms
Lead Channel Pacing Threshold Pulse Width: 0.5 ms
Lead Channel Pacing Threshold Pulse Width: 0.5 ms
Lead Channel Sensing Intrinsic Amplitude: 1.5 mV
Lead Channel Sensing Intrinsic Amplitude: 12 mV
Lead Channel Setting Pacing Amplitude: 2 V
Lead Channel Setting Pacing Amplitude: 2.5 V
Lead Channel Setting Pacing Amplitude: 3 V
Lead Channel Setting Pacing Pulse Width: 0.5 ms
Lead Channel Setting Pacing Pulse Width: 0.5 ms
Lead Channel Setting Sensing Sensitivity: 0.5 mV
Pulse Gen Serial Number: 111039291

## 2021-03-13 ENCOUNTER — Other Ambulatory Visit: Payer: Self-pay | Admitting: Physician Assistant

## 2021-03-13 DIAGNOSIS — I5042 Chronic combined systolic (congestive) and diastolic (congestive) heart failure: Secondary | ICD-10-CM

## 2021-03-13 DIAGNOSIS — I255 Ischemic cardiomyopathy: Secondary | ICD-10-CM

## 2021-03-19 NOTE — Progress Notes (Signed)
Remote ICD transmission.   

## 2021-03-20 ENCOUNTER — Other Ambulatory Visit: Payer: Self-pay | Admitting: *Deleted

## 2021-03-20 DIAGNOSIS — D649 Anemia, unspecified: Secondary | ICD-10-CM

## 2021-03-25 ENCOUNTER — Other Ambulatory Visit: Payer: Self-pay

## 2021-03-25 ENCOUNTER — Inpatient Hospital Stay: Payer: Medicaid Other | Attending: Oncology

## 2021-03-25 DIAGNOSIS — Z9079 Acquired absence of other genital organ(s): Secondary | ICD-10-CM | POA: Insufficient documentation

## 2021-03-25 DIAGNOSIS — Z7289 Other problems related to lifestyle: Secondary | ICD-10-CM | POA: Diagnosis not present

## 2021-03-25 DIAGNOSIS — C969 Malignant neoplasm of lymphoid, hematopoietic and related tissue, unspecified: Secondary | ICD-10-CM | POA: Insufficient documentation

## 2021-03-25 DIAGNOSIS — Z86010 Personal history of colonic polyps: Secondary | ICD-10-CM | POA: Diagnosis not present

## 2021-03-25 DIAGNOSIS — C7951 Secondary malignant neoplasm of bone: Secondary | ICD-10-CM | POA: Diagnosis not present

## 2021-03-25 DIAGNOSIS — D649 Anemia, unspecified: Secondary | ICD-10-CM

## 2021-03-25 DIAGNOSIS — F17210 Nicotine dependence, cigarettes, uncomplicated: Secondary | ICD-10-CM | POA: Insufficient documentation

## 2021-03-25 DIAGNOSIS — F1290 Cannabis use, unspecified, uncomplicated: Secondary | ICD-10-CM | POA: Insufficient documentation

## 2021-03-25 DIAGNOSIS — Z806 Family history of leukemia: Secondary | ICD-10-CM | POA: Insufficient documentation

## 2021-03-25 DIAGNOSIS — Z79899 Other long term (current) drug therapy: Secondary | ICD-10-CM | POA: Insufficient documentation

## 2021-03-25 DIAGNOSIS — I255 Ischemic cardiomyopathy: Secondary | ICD-10-CM | POA: Diagnosis not present

## 2021-03-25 DIAGNOSIS — I2510 Atherosclerotic heart disease of native coronary artery without angina pectoris: Secondary | ICD-10-CM | POA: Diagnosis not present

## 2021-03-25 DIAGNOSIS — R591 Generalized enlarged lymph nodes: Secondary | ICD-10-CM | POA: Diagnosis not present

## 2021-03-25 DIAGNOSIS — I110 Hypertensive heart disease with heart failure: Secondary | ICD-10-CM | POA: Diagnosis not present

## 2021-03-25 DIAGNOSIS — Z7901 Long term (current) use of anticoagulants: Secondary | ICD-10-CM | POA: Insufficient documentation

## 2021-03-25 DIAGNOSIS — Z923 Personal history of irradiation: Secondary | ICD-10-CM | POA: Insufficient documentation

## 2021-03-25 DIAGNOSIS — C801 Malignant (primary) neoplasm, unspecified: Secondary | ICD-10-CM

## 2021-03-25 DIAGNOSIS — Z8673 Personal history of transient ischemic attack (TIA), and cerebral infarction without residual deficits: Secondary | ICD-10-CM | POA: Insufficient documentation

## 2021-03-25 DIAGNOSIS — N200 Calculus of kidney: Secondary | ICD-10-CM | POA: Diagnosis not present

## 2021-03-25 DIAGNOSIS — R5383 Other fatigue: Secondary | ICD-10-CM | POA: Insufficient documentation

## 2021-03-25 DIAGNOSIS — Z823 Family history of stroke: Secondary | ICD-10-CM | POA: Insufficient documentation

## 2021-03-25 DIAGNOSIS — Z803 Family history of malignant neoplasm of breast: Secondary | ICD-10-CM | POA: Insufficient documentation

## 2021-03-25 DIAGNOSIS — D508 Other iron deficiency anemias: Secondary | ICD-10-CM | POA: Insufficient documentation

## 2021-03-25 DIAGNOSIS — I252 Old myocardial infarction: Secondary | ICD-10-CM | POA: Insufficient documentation

## 2021-03-25 DIAGNOSIS — Z79818 Long term (current) use of other agents affecting estrogen receptors and estrogen levels: Secondary | ICD-10-CM

## 2021-03-25 DIAGNOSIS — Z7902 Long term (current) use of antithrombotics/antiplatelets: Secondary | ICD-10-CM | POA: Insufficient documentation

## 2021-03-25 DIAGNOSIS — R911 Solitary pulmonary nodule: Secondary | ICD-10-CM | POA: Insufficient documentation

## 2021-03-25 DIAGNOSIS — Z833 Family history of diabetes mellitus: Secondary | ICD-10-CM | POA: Insufficient documentation

## 2021-03-25 DIAGNOSIS — R221 Localized swelling, mass and lump, neck: Secondary | ICD-10-CM | POA: Insufficient documentation

## 2021-03-25 DIAGNOSIS — Z8249 Family history of ischemic heart disease and other diseases of the circulatory system: Secondary | ICD-10-CM | POA: Insufficient documentation

## 2021-03-25 DIAGNOSIS — R0602 Shortness of breath: Secondary | ICD-10-CM | POA: Diagnosis not present

## 2021-03-25 DIAGNOSIS — N400 Enlarged prostate without lower urinary tract symptoms: Secondary | ICD-10-CM | POA: Insufficient documentation

## 2021-03-25 DIAGNOSIS — I779 Disorder of arteries and arterioles, unspecified: Secondary | ICD-10-CM | POA: Insufficient documentation

## 2021-03-25 DIAGNOSIS — C61 Malignant neoplasm of prostate: Secondary | ICD-10-CM | POA: Insufficient documentation

## 2021-03-25 DIAGNOSIS — I5042 Chronic combined systolic (congestive) and diastolic (congestive) heart failure: Secondary | ICD-10-CM | POA: Insufficient documentation

## 2021-03-25 LAB — CBC WITH DIFFERENTIAL/PLATELET
Abs Immature Granulocytes: 0.01 10*3/uL (ref 0.00–0.07)
Basophils Absolute: 0 10*3/uL (ref 0.0–0.1)
Basophils Relative: 1 %
Eosinophils Absolute: 0 10*3/uL (ref 0.0–0.5)
Eosinophils Relative: 1 %
HCT: 29.2 % — ABNORMAL LOW (ref 39.0–52.0)
Hemoglobin: 9.5 g/dL — ABNORMAL LOW (ref 13.0–17.0)
Immature Granulocytes: 0 %
Lymphocytes Relative: 27 %
Lymphs Abs: 1.1 10*3/uL (ref 0.7–4.0)
MCH: 24.6 pg — ABNORMAL LOW (ref 26.0–34.0)
MCHC: 32.5 g/dL (ref 30.0–36.0)
MCV: 75.6 fL — ABNORMAL LOW (ref 80.0–100.0)
Monocytes Absolute: 0.6 10*3/uL (ref 0.1–1.0)
Monocytes Relative: 14 %
Neutro Abs: 2.4 10*3/uL (ref 1.7–7.7)
Neutrophils Relative %: 57 %
Platelets: 353 10*3/uL (ref 150–400)
RBC: 3.86 MIL/uL — ABNORMAL LOW (ref 4.22–5.81)
RDW: 18.1 % — ABNORMAL HIGH (ref 11.5–15.5)
WBC: 4.2 10*3/uL (ref 4.0–10.5)
nRBC: 0 % (ref 0.0–0.2)

## 2021-03-25 LAB — COMPREHENSIVE METABOLIC PANEL
ALT: 14 U/L (ref 0–44)
AST: 21 U/L (ref 15–41)
Albumin: 3.8 g/dL (ref 3.5–5.0)
Alkaline Phosphatase: 68 U/L (ref 38–126)
Anion gap: 7 (ref 5–15)
BUN: 13 mg/dL (ref 8–23)
CO2: 23 mmol/L (ref 22–32)
Calcium: 9.1 mg/dL (ref 8.9–10.3)
Chloride: 104 mmol/L (ref 98–111)
Creatinine, Ser: 0.77 mg/dL (ref 0.61–1.24)
GFR, Estimated: >60 mL/min (ref >60)
Glucose, Bld: 104 mg/dL — ABNORMAL HIGH (ref 70–99)
Potassium: 3.8 mmol/L (ref 3.5–5.1)
Sodium: 134 mmol/L — ABNORMAL LOW (ref 135–145)
Total Bilirubin: 0.4 mg/dL (ref 0.3–1.2)
Total Protein: 7.1 g/dL (ref 6.5–8.1)

## 2021-03-25 LAB — PSA: Prostatic Specific Antigen: 0.58 ng/mL (ref 0.00–4.00)

## 2021-03-25 LAB — RETIC PANEL
Immature Retic Fract: 29.9 % — ABNORMAL HIGH (ref 2.3–15.9)
RBC.: 3.88 MIL/uL — ABNORMAL LOW (ref 4.22–5.81)
Retic Count, Absolute: 50.1 10*3/uL (ref 19.0–186.0)
Retic Ct Pct: 1.3 % (ref 0.4–3.1)
Reticulocyte Hemoglobin: 26.4 pg — ABNORMAL LOW (ref >27.9)

## 2021-03-25 LAB — FERRITIN: Ferritin: 5 ng/mL — ABNORMAL LOW (ref 24–336)

## 2021-03-25 LAB — IRON AND TIBC
Iron: 51 ug/dL (ref 45–182)
Saturation Ratios: 10 % — ABNORMAL LOW (ref 17.9–39.5)
TIBC: 519 ug/dL — ABNORMAL HIGH (ref 250–450)
UIBC: 468 ug/dL

## 2021-03-25 LAB — LACTATE DEHYDROGENASE: LDH: 121 U/L (ref 98–192)

## 2021-03-26 LAB — AFP TUMOR MARKER: AFP, Serum, Tumor Marker: 2.6 ng/mL (ref 0.0–8.4)

## 2021-03-26 LAB — BETA HCG QUANT (REF LAB): hCG Quant: <1 m[IU]/mL (ref 0–3)

## 2021-03-28 ENCOUNTER — Other Ambulatory Visit: Payer: Self-pay

## 2021-03-28 ENCOUNTER — Inpatient Hospital Stay (HOSPITAL_BASED_OUTPATIENT_CLINIC_OR_DEPARTMENT_OTHER): Payer: Medicaid Other | Admitting: Oncology

## 2021-03-28 ENCOUNTER — Encounter: Payer: Self-pay | Admitting: Oncology

## 2021-03-28 VITALS — BP 145/82 | HR 77 | Temp 96.3°F | Resp 18 | Wt 173.6 lb

## 2021-03-28 DIAGNOSIS — D508 Other iron deficiency anemias: Secondary | ICD-10-CM

## 2021-03-28 DIAGNOSIS — C61 Malignant neoplasm of prostate: Secondary | ICD-10-CM | POA: Diagnosis not present

## 2021-03-28 DIAGNOSIS — C801 Malignant (primary) neoplasm, unspecified: Secondary | ICD-10-CM | POA: Diagnosis not present

## 2021-03-28 MED ORDER — FERROUS SULFATE 325 (65 FE) MG PO TBEC: 325.0000 mg | DELAYED_RELEASE_TABLET | Freq: Two times a day (BID) | ORAL | 3 refills | Status: DC

## 2021-03-28 MED ORDER — DOCUSATE SODIUM 100 MG PO CAPS: 100.0000 mg | ORAL_CAPSULE | Freq: Every day | ORAL | 3 refills | Status: AC

## 2021-03-28 NOTE — Progress Notes (Signed)
Hematology/Oncology follow up note [phone] [phone]   Patient Care Team: Azzie Glatter, FNP as PCP - General (Family Medicine) [name], MD as PCP - Cardiology (Cardiology) Constance Haw, MD as PCP - Electrophysiology (Cardiology) Alyson Ingles Candee Furbish, MD as Consulting Physician (Urology)  REFERRING PROVIDER: No ref. provider found  CHIEF COMPLAINTS/REASON FOR VISIT:  Follow up for prostate cancer and germ cell tumor  HISTORY OF PRESENTING ILLNESS:   Daniel Weiss is a  61 y.o.  male with PMH listed below was seen in consultation at the request of  No ref. provider found  for evaluation of presumed prostate cancer Patient recently switched his care to [facility] and was seen by Dr. [name]. He was previously followed up at [facility] urology I reviewed Dr. Dene Gentry note. Patient was diagnosed with presumed prostate cancer on 10/2018 with a PSA of 509, incidental findings of multiple sclerotic lesions on CT-chest abdomen pelvis during work-up for a CVA.  His previous urology care was with Dr. Alyson Ingles. 11/05/2018 bone scan showed solitary punctate focus of activity in the left first sacral segment corresponding to a 9 mm mixed lytic and sclerotic metastatic's on recent CT.  The remaining numerous sclerotic osseous metastasis identified on CT do not demonstrate abnormal activity and are therefore healed.  At that time No tissue diagnosis/biopsy was obtained. Patient was started with Lupron and Casodex Lupron was eventually to Eligard [facility], PSA was 13.13, testosterone level less than 10. Casodex was discontinued in June 2021.   10/26/2019 patient switched to [facility] urology and was seen by Dr. [name] Per urology note, PSA trend: 11/03/2018: 502 11/22/2018: 377 02/21/2019: 387 04/25/2019: 123 09/05/2019: 13.3  Patient gets androgen deprivation therapy through Dr. Dene Gentry  office.  Eligard 45mg   on 11/11/2019 -Patient was referred to heme-onc on 11/01/2019.  10/06/2019 bone scan showed resolution of punctated activity over the left sacrum.  The punctate area of increased activity over the right lower lumbar spine again noted. 10/31/2019, CT angio chest aorta showed no evidence of aortic aneurysm or dissection.  Atherosclerotic calcification spleen seen in the upper abdominal aorta.  Chronic artery disease.  No acute cardiopulmonary disease. Patient has a chronic right neck mass.  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   11/18/2019 CT abdomen pelvis with contrast showed retrocaval lymphadenopathy in the right common and external iliac chains.  Stable to minimally progressed in the interval since last CT scan 11/03/2018 Numerous sclerotic bone lesions.  Stable 3 mm subpleural right middle lobe lung nodule stable.  Aortic atherosclerosis   Patient has extensive cardiology issues.  He follows up with Dr. Ellyn Hack. Patient has history of STEMI-PCI to LAD with resultant ischemia cardiomyopathy, EF 35 to 40%, left bundle branch block with acute CVA-10/2018.  CT coronary showed nonobstructing plaquing of coronary distributions, intimal irregularity with filling defect in the ascending aorta but no LV thrombus.  And the patient is on warfarin and Plavix.  Patient was seen by thoracic surgeon Dr. Roxy Manns will recommend patient to be on Coumadin for minimum of 3 months.  With presentation with stroke, Dr. Ellyn Hack recommend at least 6 to 12 months of Coumadin. -Coumadin was discontinued by Dr. Ellyn Hack in September 2021 and switched to.  Plavix Patient lives with his sister.  He has 3 adult children.  His activity is quite  limited due to shortness of breath with exertion due to cardiology problems.  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 Tumor markers Showed normal LDH, beta hCG, AFP # 01/17/2020 scrotum ultrasound showed a small right testicular mass measuring 1.6 cm, contained an internal calcification.  Appearance was consistent with primary testicular neoplasm.  Normal size and appearance of the left testicle.  #Prostate cancer, metastatic M1 A.  On androgen deprivation therapy.  Eligard through Dr. Dene Gentry office. 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  #01/17/2020 right orchiectomy residual germ cell tumor and germ cell neoplasia in situ are not identified.  Case was discussed on tumor board.  Consensus reached a point although residual germ cell tumor/infection was not identified, the presence of scar with extensive tubular atrophy is compatible with a completely regressed germ cell tumor.  He went to [facility] for second opinion as I recommended. Due to long waiting time, he left without being seen. Dr.Rush from [facility] called and agrees with patient current treatment.  11/11/2019 Eligard 45mg   on  Dr. Dene Gentry office missed his appointment with urologist on 05/11/2019 for Eligard treatments.  03/14/20 PET was reviewed and discussed with patient. - small hypermetabolic lymph nodes along the RIGHT iliac vessels is most consistent with nodal metastasis. 04/12/2020- 05/08/2020  radiation to periaortic lymph nodes as well as right hemipelvis node. 06/08/2020 Eligard 45mg  at cancer center  # Parotid mass, radiographically stable, never officially evaluated.  Patient has an ENT evaluation.  Status post biopsy- WARTHIN TUMOR.  Negative for atypia and malignancy.  # He has had CRT-D implant.  INTERVAL HISTORY Daniel Weiss is a 61 y.o. male who has above history reviewed by me today presents for follow up visit  for management of metastatic prostate cancer and germ cell tumor.  Chronic SOB, stable. No chest pain, abdominal pain.  He feels more fatigue.    Review of Systems  Constitutional:  Positive for fatigue. Negative for appetite change, chills, fever and unexpected weight change.  HENT:   Negative for hearing loss and voice change.   Eyes:  Negative for eye problems and icterus.  Respiratory:  Positive for shortness of breath. Negative for chest tightness and cough.   Cardiovascular:  Negative for chest pain and leg swelling.  Gastrointestinal:  Negative for abdominal distention and abdominal pain.  Endocrine: Negative for hot flashes.  Genitourinary:  Negative for difficulty urinating, dysuria and frequency.   Musculoskeletal:  Negative for arthralgias.  Skin:  Negative for itching and rash.  Neurological:  Negative for dizziness, light-headedness and numbness.  Hematological:  Negative for adenopathy. Does not bruise/bleed easily.  Psychiatric/Behavioral:  Negative for confusion.    MEDICAL HISTORY:  Past Medical History:  Diagnosis Date   Abnormal chest CT 02/2018   a. Ectatic 3 cm Ao arch - rec f/u outpt imaging w/ CTA or MRA. Ectatic atheromatous abd Ao @ risk for aneurysm - rec f/u u/s in 5 yrs. Marked prostatic enlargement w/ scattered small sclerotic foci in the thoracic lower lumbar spine, sacrum, left 12th rib, pelvis, and right femur.  Recommend elective outpatient whole-body bone scintigraphy and PSA.   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 02/2018 ACS/PCI: LM nl, LAD 85p (3.5x15 Sierra DES), D1 45ost, RI 55ost, RCA nl, EF 25-35%.   Cardiac murmur    Chest pain on exertion 11/28/2019   Chronic combined systolic and  diastolic CHF, NYHA class 2 ([place]) 04/17/2020   Complete left bundle branch block (LBBB) 2016   Coronary artery disease involving native coronary artery of native heart with angina pectoris ([place]) 03/10/2018   Dyspnea    Encephalopathy, hypertensive    Essential hypertension 07/07/2014   Germ cell tumor ([place]) 12/22/2019   Goals of care, counseling/discussion 12/22/2019   Hematemesis without nausea    HFrEF (heart failure with reduced ejection fraction) ([place])    History of CVA (cerebrovascular accident) 11/23/2018   History of non-ST elevation myocardial infarction (NSTEMI) 02/21/2019   Hypertension    Hypokalemia    Hyponatremia 06/29/2015   Ischemic cardiomyopathy 02/2018   a. 02/2018 Echo: EF 35-40% (LV gram on cath was 25-30%), mod, diff HK. mid-apicalanteroseptal, ant, and apical sev HK. RVH.-->  No notable change on echo from June 2020 and August 2020.   Localized swelling, mass or lump of neck    Long term (current) use of anticoagulants 11/16/2018   Malnutrition of moderate degree 11/11/2018   Mass of right side of neck 07/07/2014   Myocardial infarction [facility])    Neck mass    Right Neck mass - US neck showed 6.3 cm complex, partially cystic, partially solid mass.  CT neck showed 2 cm mass from parotid gland on right, with 3x4x5cm  Possible metastatic lymphadenopathy.  we recommended following up with Dr. Redmond Baseman ENT for biopsy of this.      Non-ST elevation (NSTEMI) myocardial infarction ([place]) 03/06/2018   NSTEMI (non-ST elevated myocardial infarction) ([place]) 02/2018   85% pLAD - DES PCI    Paresthesia 07/07/2014   Prostate cancer ([place]) 03/10/2018   Prostate cancer metastatic to multiple sites [facility]) 11/2018   Right sided weakness 07/07/2014   Stroke [facility])    TIA (transient ischemic attack) 2016   Warthin's tumor     SURGICAL HISTORY: Past Surgical History:  Procedure Laterality Date   BIV ICD INSERTION CRT-D N/A 06/06/2020   Procedure: BIV ICD INSERTION CRT-D;  Surgeon: Constance Haw,  MD;  Location: [place] CV LAB;  Service: Cardiovascular;  Laterality: N/A;   COLONOSCOPY WITH PROPOFOL N/A 02/27/2020   Procedure: COLONOSCOPY WITH PROPOFOL;  Surgeon: Yetta Flock, MD;  Location: [place];  Service: Gastroenterology;  Laterality: N/A;   CORONARY STENT INTERVENTION N/A 03/06/2018   Procedure: CORONARY STENT INTERVENTION;  Surgeon: [name], MD;  Location: [place] CV LAB;  Service: Cardiovascular: Proximal LAD 85% stenosis (and D1): DES PCI with Xience Sierra DES 3.5 mm x 15 mm - 3.9 mm   ESOPHAGOGASTRODUODENOSCOPY (EGD) WITH PROPOFOL N/A 02/27/2020   Procedure: ESOPHAGOGASTRODUODENOSCOPY (EGD) WITH PROPOFOL;  Surgeon: Yetta Flock, MD;  Location: [facility] ENDOSCOPY;  Service: Gastroenterology;  Laterality: N/A;   HEMOSTASIS CLIP PLACEMENT  02/27/2020   Procedure: HEMOSTASIS CLIP PLACEMENT;  Surgeon: Yetta Flock, MD;  Location: [place] ENDOSCOPY;  Service: Gastroenterology;;   HOT HEMOSTASIS N/A 02/27/2020   Procedure: HOT HEMOSTASIS (ARGON PLASMA COAGULATION/BICAP);  Surgeon: Yetta Flock, MD;  Location: [facility] ENDOSCOPY;  Service: Gastroenterology;  Laterality: N/A;   INTRAOPERATIVE TRANSTHORACIC ECHOCARDIOGRAM  03/06/2018   (Peri-MI)  mild reduced EF 35-40%.  Diffuse hypokinesis (severe HK of the mid-apical anteroseptal, anterior and apical myocardium consistent with LAD infarct).  Unable to assess diastolic function.  Paradoxical septal motion likely related to his LBBB.  RV hypertrophy noted.  Trivial pericardial effusion noted.    LEFT HEART CATH AND CORONARY ANGIOGRAPHY N/A 03/06/2018   Procedure: LEFT HEART CATH AND CORONARY ANGIOGRAPHY;  Surgeon: [name], MD;  Location: [place] CV LAB;  Service: Cardiovascular: Proximal LAD 85% involving D1 45%.  Ostial RI 55%.  Severe LV dysfunction EF 25 to 30%.  1+ MR.  Only minimally elevated LVEDP.   ORCHIECTOMY Right 01/17/2020   Procedure: ORCHIECTOMY;  Surgeon: Abbie Sons, MD;   Location: [facility] ORS;  Service: Urology;  Laterality: Right;   POLYPECTOMY  02/27/2020   Procedure: POLYPECTOMY;  Surgeon: Yetta Flock, MD;  Location: [facility] ENDOSCOPY;  Service: Gastroenterology;;   PROSTATE BIOPSY N/A 01/17/2020   Procedure: BIOPSY TRANSRECTAL ULTRASONIC PROSTATE (TUBP);  Surgeon: Abbie Sons, MD;  Location: [facility] ORS;  Service: Urology;  Laterality: N/A;   TRANSTHORACIC ECHOCARDIOGRAM  10/2018   (Most recent echo, to evaluate for TIA/CVA) : Moderate reduced EF 35 to 40%.  Moderate concentric LVH.  Mild dyskinesis of the apex and severe hypokinesis of mid apical anteroseptal and anterior wall.  Unable to assess RV pressures.  Aortic sclerosis with no stenosis.  No significant change seen    SOCIAL HISTORY: Social History   Socioeconomic History   Marital status: Legally Separated    Spouse name: Dorothenia   Number of children: Not on file   Years of education: Not on file   Highest education level: Not on file  Occupational History   Occupation: cook  Tobacco Use   Smoking status: Some Days    Packs/day: 0.00    Years: 0.00    Pack years: 0.00    Types: Cigarettes   Smokeless tobacco: Never   Tobacco comments:    quit three days ago  Vaping Use   Vaping Use: Never used  Substance and Sexual Activity   Alcohol use: Yes    Alcohol/week: 2.0 standard drinks    Types: 2 Cans of beer per week    Comment: 2 Beers daily.   Drug use: Yes    Frequency: 3.0 times per week    Types: Marijuana   Sexual activity: Not Currently  Other Topics Concern   Not on file  Social History Narrative   Currently not working.  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  Now has cancer.   Social Determinants of Health   Financial Resource Strain: Not on file  Food Insecurity: Not on file  Transportation Needs: Not on file  Physical Activity: Not on file  Stress: Not on file  Social Connections: Not on file  Intimate Partner Violence: Not on  file    FAMILY HISTORY: Family History  Problem Relation Age of Onset   Stroke Mother    Hypertension Mother    Diabetes type II Mother    Leukemia Other    Breast cancer Other    Stroke Brother    CAD Neg Hx     ALLERGIES:  has No Known Allergies.  MEDICATIONS:  Current Outpatient Medications  Medication Sig Dispense Refill   albuterol (VENTOLIN HFA) 108 (90 Base) MCG/ACT inhaler Inhale 2 puffs into the lungs every 6 (six) hours as needed for wheezing or shortness of breath.  8 g 11   bicalutamide (CASODEX) 50 MG tablet Take 1 tablet by mouth twice daily 60 tablet 0   clopidogrel (PLAVIX) 75 MG tablet Take 1 tablet (75 mg total) by mouth daily. 90 tablet 3   docusate sodium (COLACE) 100 MG capsule Take 1 capsule (100 mg total) by mouth daily. 30 capsule 3   ezetimibe (ZETIA) 10 MG tablet Take 1 tablet (10 mg total) by mouth daily. 90 tablet 3   ferrous sulfate 325 (65 FE) MG EC tablet Take 1 tablet (325 mg total) by mouth 2 (two) times daily with a meal. 60 tablet 3   furosemide (LASIX) 20 MG tablet May take 20 mg tablet as needed for increase shortness of breath or swelling 20 tablet 6   metoprolol succinate (TOPROL-XL) 50 MG 24 hr tablet Take 1 tablet (50 mg total) by mouth daily. Take with or immediately following a meal. 90 tablet 3   rosuvastatin (CRESTOR) 40 MG tablet Take 1 tablet (40 mg total) by mouth daily. 90 tablet 3   sacubitril-valsartan (ENTRESTO) 97-103 MG Take 1 tablet by mouth 2 (two) times daily. 180 tablet 2   spironolactone (ALDACTONE) 25 MG tablet Take 1 tablet (25 mg total) by mouth daily. 90 tablet 3   tamsulosin (FLOMAX) 0.4 MG CAPS capsule Take 1 capsule (0.4 mg total) by mouth daily after supper. 90 capsule 3   traZODone (DESYREL) 50 MG tablet Take 0.5-1 tablets (25-50 mg total) by mouth at bedtime as needed for sleep. 30 tablet 6   nitroGLYCERIN (NITROSTAT) 0.4 MG SL tablet Place 1 tablet (0.4 mg total) under the tongue every 5 (five) minutes as needed for  chest pain. (Patient not taking: Reported on 03/28/2021) 25 tablet 3   ondansetron (ZOFRAN) 4 MG tablet Take 4 mg by mouth every 4 (four) hours as needed for nausea or vomiting.  (Patient not taking: Reported on 01/28/2021)     venlafaxine XR (EFFEXOR XR) 37.5 MG 24 hr capsule Take 1 capsule (37.5 mg total) by mouth daily with breakfast. (Patient not taking: Reported on 01/28/2021) 30 capsule 1   No current facility-administered medications for this visit.   Facility-Administered Medications Ordered in Other Visits  Medication Dose Route Frequency Provider Last Rate Last Admin   leuprolide (6 Month) (ELIGARD) injection 45 mg  45 mg Subcutaneous Once Earlie Server, MD         PHYSICAL EXAMINATION: ECOG PERFORMANCE STATUS: 1 - Symptomatic but completely ambulatory Vitals:   03/28/21 1011  BP: (!) 145/82  Pulse: 77  Resp: 18  Temp: (!) 96.3 F (35.7 C)   Filed Weights   03/28/21 1011  Weight: 173 lb 9.6 oz (78.7 kg)    Physical Exam Constitutional:      General: He is not in acute distress.    Comments: Thin built.   HENT:     Head: Normocephalic and atraumatic.  Eyes:     General: No scleral icterus. Neck:     Comments: Palpable right cervical mass Cardiovascular:     Rate and Rhythm: Normal rate and regular rhythm.  Pulmonary:     Effort: Pulmonary effort is normal. No respiratory distress.     Breath sounds: No wheezing.  Abdominal:     General: Bowel sounds are normal. There is no distension.     Palpations: Abdomen is soft.  Musculoskeletal:        General: No deformity. Normal range of motion.     Cervical back: Normal range of motion and  neck supple.  Skin:    General: Skin is warm and dry.     Findings: No erythema or rash.  Neurological:     Mental Status: He is alert and oriented to person, place, and time. Mental status is at baseline.     Cranial Nerves: No cranial nerve deficit.     Coordination: Coordination normal.  Psychiatric:        Mood and Affect:  Mood normal.    LABORATORY DATA:  I have reviewed the data as listed Lab Results  Component Value Date   WBC 4.2 03/25/2021   HGB 9.5 (L) 03/25/2021   HCT 29.2 (L) 03/25/2021   MCV 75.6 (L) 03/25/2021   PLT 353 03/25/2021   Recent Labs    09/17/20 1138 12/19/20 0904 03/25/21 0938  NA 133* 138 134*  K 3.9 3.8 3.8  CL 99 105 104  CO2 23 23 23   GLUCOSE 84 100* 104*  BUN 11 6 13   CREATININE 0.72 0.74 0.77  CALCIUM 9.3 9.2 9.1  GFRNONAA >60 >60 >60  PROT 7.9 7.8 7.1  ALBUMIN 3.9 3.9 3.8  AST 31 28 21   ALT 20 17 14   ALKPHOS 75 69 68  BILITOT <0.1* 0.3 0.4    Iron/TIBC/Ferritin/ %Sat    Component Value Date/Time   IRON 51 03/25/2021 0938   TIBC 519 (H) 03/25/2021 0938   FERRITIN 5 (L) 03/25/2021 0938   IRONPCTSAT 10 (L) 03/25/2021 0350      RADIOGRAPHIC STUDIES: I have personally reviewed the radiological images as listed and agreed with the findings in the report. CUP PACEART REMOTE DEVICE CHECK  Result Date: 03/07/2021 Scheduled remote reviewed. Normal device function.  5 NSVT, no EGM's 15 SVT, longest 21min, HR's 171-173, majority <82min Next remote 91 days. LR     ASSESSMENT & PLAN:  1. Prostate cancer ([place])   2. Germ cell tumor ([place])   3. 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 Continue Eligard 45mg  Q6 months- due for next visit in 3 months.  Patient declined additional chemotherapy or oral agent at this point,  PSA is stably   #Germ cell tumor, stage II  Status post radiation.  He has normall AFP, hCG, LDH were normal. 12/19/2020 CT chest abdomen pelvis showed interval resolution of retrocaval and right iliac lymphadenotomy. No new  or progressive disease. Stable sclerotic lesions. Non obstructing bilateral nephrolithiasis.    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  Not hypermetabolic on PET scan.  # Iron deficiency anemia. Recommend him to start oral ferrous sulfate 325mg  BID, recommend colace if he gets constipated.  I recommend patient to contact gastroenterologist for follow-up for evaluation of iron deficiency anemia.  All questions were answered. The patient knows to call the clinic with any problems questions or concerns.  Return of visit: 3 months.  Earlie Server, MD, PhD 03/28/2021

## 2021-03-28 NOTE — Progress Notes (Signed)
Pt here for follow up. No new concerns voiced.   

## 2021-04-02 ENCOUNTER — Other Ambulatory Visit: Payer: Medicaid Other

## 2021-04-04 ENCOUNTER — Other Ambulatory Visit: Payer: Self-pay

## 2021-04-04 DIAGNOSIS — R221 Localized swelling, mass and lump, neck: Secondary | ICD-10-CM

## 2021-04-17 ENCOUNTER — Other Ambulatory Visit: Payer: Medicaid Other

## 2021-05-08 ENCOUNTER — Other Ambulatory Visit: Payer: Medicaid Other

## 2021-05-16 ENCOUNTER — Other Ambulatory Visit: Payer: Self-pay

## 2021-05-16 ENCOUNTER — Ambulatory Visit
Admission: RE | Admit: 2021-05-16 | Discharge: 2021-05-16 | Disposition: A | Discharge status: Home / Self Care | Payer: Medicaid Other | Source: Ambulatory Visit | Attending: Radiation Oncology | Admitting: Radiation Oncology

## 2021-05-16 ENCOUNTER — Telehealth: Payer: Self-pay | Admitting: *Deleted

## 2021-05-16 VITALS — BP 184/112 | HR 90 | Temp 98.7°F | Resp 20 | Wt 167.2 lb

## 2021-05-16 DIAGNOSIS — Z923 Personal history of irradiation: Secondary | ICD-10-CM | POA: Diagnosis not present

## 2021-05-16 DIAGNOSIS — C801 Malignant (primary) neoplasm, unspecified: Secondary | ICD-10-CM

## 2021-05-16 DIAGNOSIS — C778 Secondary and unspecified malignant neoplasm of lymph nodes of multiple regions: Secondary | ICD-10-CM | POA: Diagnosis not present

## 2021-05-16 DIAGNOSIS — C61 Malignant neoplasm of prostate: Secondary | ICD-10-CM | POA: Diagnosis present

## 2021-05-16 DIAGNOSIS — Z8547 Personal history of malignant neoplasm of testis: Secondary | ICD-10-CM | POA: Diagnosis not present

## 2021-05-16 DIAGNOSIS — R296 Repeated falls: Secondary | ICD-10-CM

## 2021-05-16 NOTE — Telephone Encounter (Signed)
Patient returned my phone call. He accepted the 3/29 apt with Gwenette Greet ?

## 2021-05-16 NOTE — Progress Notes (Signed)
Radiation Oncology ?Follow up Note ? ?Name: Daniel Weiss   ?Date:   05/16/2021 ?MRN:  [id number] ?DOB: 1960/04/03  ? ? ?This 61 y.o. 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 ? ?REFERRING PROVIDER: No ref. provider found ? 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  Seen today in routine follow-up he is doing well specifically denies any abdominal complaints or bone pain.  He does also have.  Metastatic castrate sensitive prostate cancer currently on ADT therapy.  His PSA has been stable.  He had a recent CT scan back in October showing resolution of retrocaval and right iliac lymphadenopathy seem to be hypermetabolic on previous PET CT scan no new or progressive lesions noted. ? ?COMPLICATIONS OF TREATMENT: none ? ?FOLLOW UP COMPLIANCE: keeps appointments  ? ?PHYSICAL EXAM:  ?BP (!) 184/112   Pulse 90   Temp 98.7 ?F (37.1 ?C)   Resp 20   Wt 167 lb 3.2 oz (75.8 kg)   SpO2 100%   BMI 22.68 kg/m?  ?Well-developed well-nourished patient in NAD. HEENT reveals PERLA, EOMI, discs not visualized.  Oral cavity is clear. No oral mucosal lesions are identified. Neck is clear without evidence of cervical or supraclavicular adenopathy. Lungs are clear to A&P. Cardiac examination is essentially unremarkable with regular rate and rhythm without murmur rub or thrill. Abdomen is benign with no organomegaly or masses noted. Motor sensory and DTR levels are equal and symmetric in the upper and lower extremities. Cranial nerves II through XII are grossly intact. Proprioception is intact. No peripheral adenopathy or edema is identified. No motor or sensory levels are noted. Crude visual fields are within normal range. ? ?RADIOLOGY RESULTS: CT scans reviewed compatible with above-stated findings ? ?PLAN: Present  time patient is doing well no evidence of his germ cell tumor.  Continues treatment with ADT therapy through oncology for his castrate sensitive prostate cancer.  I have asked to see him back in 1 year for follow-up.  Patient knows to call with any concerns. ? ?I would like to take this opportunity to thank you for allowing me to participate in the care of your patient.. ?  ? Noreene Filbert, MD ? ?

## 2021-05-16 NOTE — Telephone Encounter (Signed)
Vm left for patient re: referral to [facility]. Referred by Dr. Baruch Gouty due to frequent falls. ?I requested the patient to return my phone call. I will tentatively hold an apt on 3/29 for this patient. ?

## 2021-05-20 ENCOUNTER — Other Ambulatory Visit: Payer: Self-pay | Admitting: Cardiology

## 2021-05-27 ENCOUNTER — Other Ambulatory Visit: Payer: Medicaid Other

## 2021-06-05 ENCOUNTER — Other Ambulatory Visit: Payer: Self-pay

## 2021-06-05 ENCOUNTER — Inpatient Hospital Stay: Payer: Medicaid Other | Attending: Oncology | Admitting: Occupational Therapy

## 2021-06-05 ENCOUNTER — Telehealth: Payer: Self-pay

## 2021-06-05 ENCOUNTER — Other Ambulatory Visit: Payer: Self-pay | Admitting: Oncology

## 2021-06-05 DIAGNOSIS — R296 Repeated falls: Secondary | ICD-10-CM

## 2021-06-05 DIAGNOSIS — R221 Localized swelling, mass and lump, neck: Secondary | ICD-10-CM

## 2021-06-05 DIAGNOSIS — R42 Dizziness and giddiness: Secondary | ICD-10-CM

## 2021-06-05 NOTE — Therapy (Signed)
?[facility] ?[address], [address] ?[place], [place], [address] ?Phone: [phone]   Fax:  [phone] ? ?Occupational Therapy Screen: ? ?Patient Details  ?Name: Daniel Weiss ?MRN: [id number] ?Date of Birth: 08/13/1960 ?No data recorded ? ?Encounter Date: 06/05/2021 ? ? OT End of Session - 06/05/21 1744   ? ? Visit Number 0   ? ?  ?  ? ?  ? ? ?Past Medical History:  ?Diagnosis Date  ? Abnormal chest CT 02/2018  ? a. Ectatic 3 cm Ao arch - rec f/u outpt imaging w/ CTA or MRA. Ectatic atheromatous abd Ao @ risk for aneurysm - rec f/u u/s in 5 yrs. Marked prostatic enlargement w/ scattered small sclerotic foci in the thoracic lower lumbar spine, sacrum, left 12th rib, pelvis, and right femur.  Recommend elective outpatient whole-body bone scintigraphy and PSA.  ? Abnormal CT scan 03/10/2018  ? Acute blood loss anemia   ? Acute cardioembolic stroke ([place])   ? Acute cerebrovascular accident (CVA) ([place]) 11/03/2018  ? 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 ARF (acute renal failure) ([place]) 06/29/2015  ? Benign neoplasm of colon   ? CAD (coronary artery disease)   ? a. 02/2018 ACS/PCI: LM nl, LAD 85p (3.5x15 Sierra DES), D1 45ost, RI 55ost, RCA nl, EF 25-35%.  ? Cardiac murmur   ? Chest pain on exertion 11/28/2019  ? Chronic combined systolic and diastolic CHF, NYHA class 2 ([place]) 04/17/2020  ? Complete left bundle branch block (LBBB) 2016  ? Coronary artery disease involving native coronary artery of native heart with angina pectoris ([place]) 03/10/2018  ? Dyspnea   ? Encephalopathy, hypertensive   ? Essential hypertension 07/07/2014  ? 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 Goals of care, counseling/discussion 12/22/2019  ? Hematemesis without nausea   ? HFrEF (heart failure with reduced ejection fraction) ([place])   ? History of CVA (cerebrovascular accident) 11/23/2018  ? History of non-ST elevation myocardial infarction (NSTEMI) 02/21/2019  ? Hypertension   ? Hypokalemia   ? Hyponatremia  06/29/2015  ? Ischemic cardiomyopathy 02/2018  ? a. 02/2018 Echo: EF 35-40% (LV gram on cath was 25-30%), mod, diff HK. mid-apicalanteroseptal, ant, and apical sev HK. RVH.-->  No notable change on echo from June 2020 and August 2020.  ? Localized swelling, mass or lump of neck   ? Long term (current) use of anticoagulants 11/16/2018  ? Malnutrition of moderate degree 11/11/2018  ? Mass of right side of neck 07/07/2014  ? Myocardial infarction [facility])   ? Neck mass   ? Right Neck mass - US neck showed 6.3 cm complex, partially cystic, partially solid mass.  CT neck showed 2 cm mass from parotid gland on right, with 3x4x5cm  Possible metastatic lymphadenopathy.  we recommended following up with Dr. Redmond Baseman ENT for biopsy of this.     ? Non-ST elevation (NSTEMI) myocardial infarction ([place]) 03/06/2018  ? NSTEMI (non-ST elevated myocardial infarction) ([place]) 02/2018  ? 85% pLAD - DES PCI   ? Paresthesia 07/07/2014  ? Prostate cancer ([place]) 03/10/2018  ? Prostate cancer metastatic to multiple sites [facility]) 11/2018  ? Right sided weakness 07/07/2014  ? Stroke [facility])   ? TIA (transient ischemic attack) 2016  ? Warthin's tumor   ? ? ?Past Surgical History:  ?Procedure Laterality Date  ? BIV ICD INSERTION CRT-D N/A 06/06/2020  ? Procedure: BIV ICD INSERTION CRT-D;  Surgeon: Constance Haw, MD;  Location: [place] [facility];  Service:  Cardiovascular;  Laterality: N/A;  ? COLONOSCOPY WITH PROPOFOL N/A 02/27/2020  ? Procedure: COLONOSCOPY WITH PROPOFOL;  Surgeon: Yetta Flock, MD;  Location: [place];  Service: Gastroenterology;  Laterality: N/A;  ? CORONARY STENT INTERVENTION N/A 03/06/2018  ? Procedure: CORONARY STENT INTERVENTION;  Surgeon: [name], MD;  Location: [place] [facility];  Service: Cardiovascular: Proximal LAD 85% stenosis (and D1): DES PCI with Xience Sierra DES 3.5 mm x 15 mm - 3.9 mm  ? ESOPHAGOGASTRODUODENOSCOPY (EGD) WITH PROPOFOL N/A 02/27/2020  ? Procedure: ESOPHAGOGASTRODUODENOSCOPY (EGD) WITH  PROPOFOL;  Surgeon: Yetta Flock, MD;  Location: [place];  Service: Gastroenterology;  Laterality: N/A;  ? HEMOSTASIS CLIP PLACEMENT  02/27/2020  ? Procedure: HEMOSTASIS CLIP PLACEMENT;  Surgeon: Yetta Flock, MD;  Location: [place];  Service: Gastroenterology;;  ? HOT HEMOSTASIS N/A 02/27/2020  ? Procedure: HOT HEMOSTASIS (ARGON PLASMA COAGULATION/BICAP);  Surgeon: Yetta Flock, MD;  Location: [facility];  Service: Gastroenterology;  Laterality: N/A;  ? INTRAOPERATIVE TRANSTHORACIC ECHOCARDIOGRAM  03/06/2018  ? (Peri-MI) mild reduced EF 35-40%.  Diffuse hypokinesis (severe HK of the mid-apical anteroseptal, anterior and apical myocardium consistent with LAD infarct).  Unable to assess diastolic function.  Paradoxical septal motion likely related to his LBBB.  RV hypertrophy noted.  Trivial pericardial effusion noted.   ? LEFT HEART CATH AND CORONARY ANGIOGRAPHY N/A 03/06/2018  ? Procedure: LEFT HEART CATH AND CORONARY ANGIOGRAPHY;  Surgeon: [name], MD;  Location: [place] [facility];  Service: Cardiovascular: Proximal LAD 85% involving D1 45%.  Ostial RI 55%.  Severe LV dysfunction EF 25 to 30%.  1+ MR.  Only minimally elevated LVEDP.  ? ORCHIECTOMY Right 01/17/2020  ? Procedure: ORCHIECTOMY;  Surgeon: Abbie Sons, MD;  Location: [facility];  Service: Urology;  Laterality: Right;  ? POLYPECTOMY  02/27/2020  ? Procedure: POLYPECTOMY;  Surgeon: Yetta Flock, MD;  Location: [place];  Service: Gastroenterology;;  ? PROSTATE BIOPSY N/A 01/17/2020  ? Procedure: BIOPSY TRANSRECTAL ULTRASONIC PROSTATE (TUBP);  Surgeon: Abbie Sons, MD;  Location: [facility];  Service: Urology;  Laterality: N/A;  ? TRANSTHORACIC ECHOCARDIOGRAM  10/2018  ? (Most recent echo, to evaluate for TIA/CVA) : Moderate reduced EF 35 to 40%.  Moderate concentric LVH.  Mild dyskinesis of the apex and severe hypokinesis of mid apical anteroseptal and anterior wall.  Unable to assess RV  pressures.  Aortic sclerosis with no stenosis.  No significant change seen  ? ? ?There were no vitals filed for this visit. ? ? Subjective Assessment - 06/05/21 1741   ? ? Subjective  I had a few falls past 6 months probably 4-6.  I just get dizzy at times when I am turning or mopping or walking to the store.  If I walk to the store I take a cane because my legs get tight and weak.  I had in the past 2 strokes and a heart attack and a pacemaker.  And now have cancer   ? Currently in Pain? Yes   ? Pain Score 4    ? Pain Location Leg   ? Pain Orientation Right;Left   ? Pain Descriptors / Indicators Tightness   ? Pain Type Acute pain;Chronic pain   ? Pain Frequency Intermittent   ? ?  ?  ? ?  ? ? ? ? ? ? ? ? ? ? ? ? ?DR Tasia Catchings 03/28/21: ?ASSESSMENT & PLAN:  ?1. Prostate cancer ([place])   ?2. 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  3. Other iron deficiency anemia   ? Cancer Staging  ?Prostate cancer ([place]) ?Staging form: Prostate, AJCC 8th Edition ?- Clinical stage from 01/17/2020: Stage IVB (cT2c, cN0, cM1, Grade Group: 4) - Signed by Earlie Server, MD on 03/16/2020 ?  ?  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 ?Continue Eligard '45mg'$  Q6 months- due for next visit in 3 months.  ?Patient declined additional chemotherapy or oral agent at this point,  ?PSA is stably  ?  ?#Germ cell tumor, stage II  ?Status post radiation.  He has normall AFP, hCG, LDH were normal. ?12/19/2020 CT chest abdomen pelvis showed interval resolution of retrocaval and right iliac lymphadenotomy. No new or progressive disease. Stable sclerotic lesions. Non obstructing bilateral nephrolithiasis.  ?  ?  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  Not hypermetabolic on PET scan. ?  ?# Iron deficiency anemia. Recommend him to start oral ferrous sulfate '325mg'$  BID, recommend colace if he gets constipated.  ?I recommend patient to contact gastroenterologist for  follow-up for evaluation of iron deficiency anemia. ? ?OT SCREEN 06/05/21: ?Patient referred to OT by Dr. Donella Stade with increased falls.  Patient report living alone in apartment.  Tub shower combo with shower chair

## 2021-06-05 NOTE — Telephone Encounter (Signed)
Per Dr. Tasia Catchings: ?Daniel Weiss saw patient today and his neck mass seems to causing him dizziness with certain neck position. please let him know that I recommend CT neck soft tissue to be done ASAP, make it stat if we can not do this within a week thanks.  ? ? ? ?Patient scheduled for CT on 06/14/2021. Informed patient of appointment. Advised [facility] 1-2 days prior to scan. Patient verbalized understanding.  ?

## 2021-06-06 ENCOUNTER — Ambulatory Visit (INDEPENDENT_AMBULATORY_CARE_PROVIDER_SITE_OTHER): Payer: Medicaid Other

## 2021-06-06 DIAGNOSIS — I255 Ischemic cardiomyopathy: Secondary | ICD-10-CM

## 2021-06-06 LAB — CUP PACEART REMOTE DEVICE CHECK
Battery Remaining Longevity: 66 mo
Battery Remaining Percentage: 82 %
Battery Voltage: 2.98 V
Brady Statistic AP VP Percent: 1.7 %
Brady Statistic AP VS Percent: 1 %
Brady Statistic AS VP Percent: 92 %
Brady Statistic AS VS Percent: 6 %
Brady Statistic RA Percent Paced: 1.6 %
Date Time Interrogation Session: 20230330030018
HighPow Impedance: 70 Ohm
Implantable Lead Implant Date: 20220330
Implantable Lead Implant Date: 20220330
Implantable Lead Implant Date: 20220330
Implantable Lead Location: 753858
Implantable Lead Location: 753859
Implantable Lead Location: 753860
Implantable Pulse Generator Implant Date: 20220330
Lead Channel Impedance Value: 360 Ohm
Lead Channel Impedance Value: 450 Ohm
Lead Channel Impedance Value: 740 Ohm
Lead Channel Pacing Threshold Amplitude: 0.5 V
Lead Channel Pacing Threshold Amplitude: 0.75 V
Lead Channel Pacing Threshold Amplitude: 1.5 V
Lead Channel Pacing Threshold Pulse Width: 0.5 ms
Lead Channel Pacing Threshold Pulse Width: 0.5 ms
Lead Channel Pacing Threshold Pulse Width: 0.5 ms
Lead Channel Sensing Intrinsic Amplitude: 12 mV
Lead Channel Sensing Intrinsic Amplitude: 2.9 mV
Lead Channel Setting Pacing Amplitude: 2 V
Lead Channel Setting Pacing Amplitude: 2.5 V
Lead Channel Setting Pacing Amplitude: 3 V
Lead Channel Setting Pacing Pulse Width: 0.5 ms
Lead Channel Setting Pacing Pulse Width: 0.5 ms
Lead Channel Setting Sensing Sensitivity: 0.5 mV
Pulse Gen Serial Number: 111039291

## 2021-06-14 ENCOUNTER — Ambulatory Visit
Admission: RE | Admit: 2021-06-14 | Discharge: 2021-06-14 | Disposition: A | Discharge status: Home / Self Care | Payer: Medicaid Other | Source: Ambulatory Visit | Attending: Oncology | Admitting: Oncology

## 2021-06-14 DIAGNOSIS — R221 Localized swelling, mass and lump, neck: Secondary | ICD-10-CM | POA: Insufficient documentation

## 2021-06-14 LAB — POCT I-STAT CREATININE: Creatinine, Ser: 0.8 mg/dL (ref 0.61–1.24)

## 2021-06-14 MED ORDER — IOHEXOL 300 MG/ML  SOLN
100.0000 mL | Freq: Once | INTRAMUSCULAR | Status: AC | PRN
  Administered 2021-06-14: 75 mL via INTRAVENOUS

## 2021-06-14 MED ORDER — IOPAMIDOL (ISOVUE-300) INJECTION 61%: 75.0000 mL | Freq: Once | INTRAVENOUS | Status: DC | PRN

## 2021-06-18 NOTE — Progress Notes (Signed)
Remote ICD transmission.   

## 2021-06-19 ENCOUNTER — Other Ambulatory Visit: Payer: Medicaid Other

## 2021-06-24 ENCOUNTER — Inpatient Hospital Stay: Payer: Medicaid Other | Attending: Oncology

## 2021-06-24 DIAGNOSIS — Z923 Personal history of irradiation: Secondary | ICD-10-CM | POA: Insufficient documentation

## 2021-06-24 DIAGNOSIS — Z859 Personal history of malignant neoplasm, unspecified: Secondary | ICD-10-CM | POA: Diagnosis not present

## 2021-06-24 DIAGNOSIS — C61 Malignant neoplasm of prostate: Secondary | ICD-10-CM | POA: Insufficient documentation

## 2021-06-24 DIAGNOSIS — R42 Dizziness and giddiness: Secondary | ICD-10-CM | POA: Diagnosis not present

## 2021-06-24 DIAGNOSIS — R296 Repeated falls: Secondary | ICD-10-CM | POA: Insufficient documentation

## 2021-06-24 DIAGNOSIS — Z79899 Other long term (current) drug therapy: Secondary | ICD-10-CM | POA: Insufficient documentation

## 2021-06-24 DIAGNOSIS — D509 Iron deficiency anemia, unspecified: Secondary | ICD-10-CM | POA: Insufficient documentation

## 2021-06-24 DIAGNOSIS — Z191 Hormone sensitive malignancy status: Secondary | ICD-10-CM | POA: Insufficient documentation

## 2021-06-24 DIAGNOSIS — I110 Hypertensive heart disease with heart failure: Secondary | ICD-10-CM | POA: Diagnosis not present

## 2021-06-24 DIAGNOSIS — C7951 Secondary malignant neoplasm of bone: Secondary | ICD-10-CM | POA: Insufficient documentation

## 2021-06-24 DIAGNOSIS — R221 Localized swelling, mass and lump, neck: Secondary | ICD-10-CM | POA: Insufficient documentation

## 2021-06-24 LAB — COMPREHENSIVE METABOLIC PANEL
ALT: 15 U/L (ref 0–44)
AST: 23 U/L (ref 15–41)
Albumin: 4 g/dL (ref 3.5–5.0)
Alkaline Phosphatase: 88 U/L (ref 38–126)
Anion gap: 9 (ref 5–15)
BUN: 12 mg/dL (ref 8–23)
CO2: 22 mmol/L (ref 22–32)
Calcium: 9.8 mg/dL (ref 8.9–10.3)
Chloride: 103 mmol/L (ref 98–111)
Creatinine, Ser: 0.82 mg/dL (ref 0.61–1.24)
GFR, Estimated: >60 mL/min (ref >60)
Glucose, Bld: 106 mg/dL — ABNORMAL HIGH (ref 70–99)
Potassium: 4 mmol/L (ref 3.5–5.1)
Sodium: 134 mmol/L — ABNORMAL LOW (ref 135–145)
Total Bilirubin: 0.3 mg/dL (ref 0.3–1.2)
Total Protein: 8.2 g/dL — ABNORMAL HIGH (ref 6.5–8.1)

## 2021-06-24 LAB — CBC WITH DIFFERENTIAL/PLATELET
Abs Immature Granulocytes: 0.02 10*3/uL (ref 0.00–0.07)
Basophils Absolute: 0 10*3/uL (ref 0.0–0.1)
Basophils Relative: 1 %
Eosinophils Absolute: 0 10*3/uL (ref 0.0–0.5)
Eosinophils Relative: 0 %
HCT: 38.3 % — ABNORMAL LOW (ref 39.0–52.0)
Hemoglobin: 12.4 g/dL — ABNORMAL LOW (ref 13.0–17.0)
Immature Granulocytes: 0 %
Lymphocytes Relative: 27 %
Lymphs Abs: 1.5 10*3/uL (ref 0.7–4.0)
MCH: 23.9 pg — ABNORMAL LOW (ref 26.0–34.0)
MCHC: 32.4 g/dL (ref 30.0–36.0)
MCV: 73.9 fL — ABNORMAL LOW (ref 80.0–100.0)
Monocytes Absolute: 0.7 10*3/uL (ref 0.1–1.0)
Monocytes Relative: 12 %
Neutro Abs: 3.4 10*3/uL (ref 1.7–7.7)
Neutrophils Relative %: 60 %
Platelets: 431 10*3/uL — ABNORMAL HIGH (ref 150–400)
RBC: 5.18 MIL/uL (ref 4.22–5.81)
RDW: 21.2 % — ABNORMAL HIGH (ref 11.5–15.5)
WBC: 5.6 10*3/uL (ref 4.0–10.5)
nRBC: 0 % (ref 0.0–0.2)

## 2021-06-24 LAB — PSA: Prostatic Specific Antigen: 1.8 ng/mL (ref 0.00–4.00)

## 2021-06-26 ENCOUNTER — Encounter: Payer: Self-pay | Admitting: Oncology

## 2021-06-26 ENCOUNTER — Inpatient Hospital Stay: Payer: Medicaid Other

## 2021-06-26 ENCOUNTER — Inpatient Hospital Stay (HOSPITAL_BASED_OUTPATIENT_CLINIC_OR_DEPARTMENT_OTHER): Payer: Medicaid Other | Admitting: Oncology

## 2021-06-26 VITALS — BP 156/95 | HR 85 | Temp 96.7°F | Wt 167.1 lb

## 2021-06-26 DIAGNOSIS — D119 Benign neoplasm of major salivary gland, unspecified: Secondary | ICD-10-CM | POA: Diagnosis not present

## 2021-06-26 DIAGNOSIS — C801 Malignant (primary) neoplasm, unspecified: Secondary | ICD-10-CM | POA: Diagnosis not present

## 2021-06-26 DIAGNOSIS — C61 Malignant neoplasm of prostate: Secondary | ICD-10-CM | POA: Diagnosis not present

## 2021-06-26 DIAGNOSIS — R296 Repeated falls: Secondary | ICD-10-CM | POA: Insufficient documentation

## 2021-06-26 MED ORDER — LEUPROLIDE ACETATE (6 MONTH) 45 MG ~~LOC~~ KIT: 45.0000 mg | PACK | Freq: Once | SUBCUTANEOUS | Status: AC

## 2021-06-26 MED ORDER — LEUPROLIDE ACETATE (6 MONTH) 45 MG ~~LOC~~ KIT
PACK | SUBCUTANEOUS | Status: AC
  Administered 2021-06-26: 45 mg via SUBCUTANEOUS
  Filled 2021-06-26: qty 45

## 2021-06-26 NOTE — Progress Notes (Addendum)
?Hematology/Oncology follow up note ?[phone] [phone] ? ? ?Patient Care Team: ?Azzie Glatter, FNP as PCP - General (Family Medicine) ?[name], MD as PCP - Cardiology (Cardiology) ?Constance Haw, MD as PCP - Electrophysiology (Cardiology) ?McKenzie, Candee Furbish, MD as Consulting Physician (Urology) ? ?REFERRING PROVIDER: ?Azzie Glatter, FNP  ?CHIEF COMPLAINTS/REASON FOR VISIT:  ?Follow up for prostate cancer and germ cell tumor ? ?HISTORY OF PRESENTING ILLNESS:  ? ?Daniel Weiss is a  61 y.o.  male with PMH listed below was seen in consultation at the request of  Azzie Glatter, FNP  for evaluation of presumed prostate cancer ?Patient recently switched his care to [facility] and was seen by Dr. [name]. ?He was previously followed up at [facility] urology ?I reviewed Dr. Dene Gentry note. ?Patient was diagnosed with presumed prostate cancer on 10/2018 with a PSA of 509, incidental findings of multiple sclerotic lesions on CT-chest abdomen pelvis during work-up for a CVA.  His previous urology care was with Dr. Alyson Ingles. ?11/05/2018 bone scan showed solitary punctate focus of activity in the left first sacral segment corresponding to a 9 mm mixed lytic and sclerotic metastatic's on recent CT.  The remaining numerous sclerotic osseous metastasis identified on CT do not demonstrate abnormal activity and are therefore healed.  At that time No tissue diagnosis/biopsy was obtained. ?Patient was started with Lupron and Casodex Lupron was eventually to Eligard ?[facility], PSA was 13.13, testosterone level less than 10. ?Casodex was discontinued in June 2021.   ?10/26/2019 patient switched to [facility] urology and was seen by Dr. [name] ?Per urology note, PSA trend: ?11/03/2018: 502 ?11/22/2018: 377 ?02/21/2019: 387 ?04/25/2019: 123 ?09/05/2019: 13.3 ? ?Patient gets androgen deprivation therapy through Dr. Dene Gentry  office.  Eligard '45mg'$   on 11/11/2019 ?-Patient was referred to heme-onc on 11/01/2019. ? ?10/06/2019 bone scan showed resolution of punctated activity over the left sacrum.  The punctate area of increased activity over the right lower lumbar spine again noted. ?10/31/2019, CT angio chest aorta showed no evidence of aortic aneurysm or dissection.  Atherosclerotic calcification spleen seen in the upper abdominal aorta.  Chronic artery disease.  No acute cardiopulmonary disease. ?Patient has a chronic right neck mass.  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   ?11/18/2019 CT abdomen pelvis with contrast showed retrocaval lymphadenopathy in the right common and external iliac chains.  Stable to minimally progressed in the interval since last CT scan 11/03/2018 ?Numerous sclerotic bone lesions.  Stable 3 mm subpleural right middle lobe lung nodule stable.  Aortic atherosclerosis ? ? ?Patient has extensive cardiology issues.  He follows up with Dr. Ellyn Hack. ?Patient has history of STEMI-PCI to LAD with resultant ischemia cardiomyopathy, EF 35 to 40%, left bundle branch block with acute CVA-10/2018.  CT coronary showed nonobstructing plaquing of coronary distributions, intimal irregularity with filling defect in the ascending aorta but no LV thrombus.  And the patient is on warfarin and Plavix.  Patient was seen by thoracic surgeon Dr. Roxy Manns will recommend patient to be on Coumadin for minimum of 3 months.  With presentation with stroke, Dr. Ellyn Hack recommend at least 6 to 12 months of Coumadin. -Coumadin was discontinued by Dr. Ellyn Hack in September 2021 and switched to.  Plavix ?Patient lives with his sister.  He has 3 adult children.  His activity is quite  limited due to shortness of breath with exertion due to cardiology problems. ? 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 ?Tumor markers Showed normal LDH, beta hCG, AFP ?# 01/17/2020 scrotum ultrasound showed a small right testicular mass measuring 1.6 cm, contained an internal calcification.  Appearance was consistent with primary testicular neoplasm.  Normal size and appearance of the left testicle. ? ?#Prostate cancer, metastatic M1 A.  On androgen deprivation therapy.  Eligard through Dr. Dene Gentry office. 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  ?#01/17/2020 right orchiectomy ?residual germ cell tumor and germ cell neoplasia in situ are not identified.  Case was discussed on tumor board.  Consensus reached a point although residual germ cell tumor/infection was not identified, the presence of scar with extensive tubular atrophy is compatible with a completely regressed germ cell tumor. ? ?He went to [facility] for second opinion as I recommended. Due to long waiting time, he left without being seen. Dr.Rush from [facility] called and agrees with patient current treatment.  ?11/11/2019 Eligard '45mg'$   on  Dr. Dene Gentry office ?missed his appointment with urologist on 05/11/2019 for Eligard treatments. ? ?03/14/20 PET was reviewed and discussed with patient. - small hypermetabolic lymph nodes along the RIGHT iliac vessels is most consistent with nodal metastasis. ?04/12/2020- 05/08/2020  radiation to periaortic lymph nodes as well as right hemipelvis node. ?06/08/2020 Eligard '45mg'$  at cancer center ? ?# Parotid mass, radiographically stable, never officially evaluated.  ?Patient has an ENT evaluation.  Status post biopsy- WARTHIN TUMOR.  Negative for atypia and malignancy. ? ?# He has had CRT-D implant. ? ?INTERVAL HISTORY ?Daniel Weiss is a 61 y.o. male who has above history reviewed by me today presents for follow up visit  for management of metastatic prostate cancer and germ cell tumor.  ?During the interval, patient has encountered frequent falls.  He feels dizziness when he turns his head to the right side. ?06/14/2021, CT neck soft tissue with contrast showed multiple bilateral parotid masses, largest lesion on the right has increased the size, now 5.1 x 2.9 x 4.9 cm.  Previously 4.1 cm in 2016. ? ?Right large lesion was previously biopsied and pathology showed Warthin tumor.  Conservative management was recommended due to his multiple medical comorbidities.  He previously was seen by Dr. Tami Ribas. ? ?+ hot flash ? ? ? ?Review of Systems  ?Constitutional:  Positive for fatigue. Negative for appetite change, chills, fever and unexpected weight change.  ?HENT:   Negative for hearing loss and voice change.   ?Eyes:  Negative for eye problems and icterus.  ?Respiratory:  Positive for shortness of breath. Negative for chest tightness and cough.   ?Cardiovascular:  Negative for chest pain and leg swelling.  ?Gastrointestinal:  Negative for abdominal distention and abdominal pain.  ?Endocrine: Positive for hot flashes.  ?Genitourinary:  Negative for difficulty urinating, dysuria and frequency.   ?Musculoskeletal:  Negative for arthralgias.  ?Skin:  Negative for itching and rash.  ?Neurological:  Negative for dizziness, light-headedness and numbness.  ?Hematological:  Negative for adenopathy. Does not bruise/bleed easily.  ?Psychiatric/Behavioral:  Negative for confusion.   ? ?MEDICAL HISTORY:  ?Past Medical History:  ?Diagnosis Date  ? Abnormal chest CT 02/2018  ? a. Ectatic 3 cm Ao arch - rec f/u outpt imaging w/  CTA or MRA. Ectatic atheromatous abd Ao @ risk for aneurysm - rec f/u u/s in 5 yrs. Marked prostatic enlargement w/ scattered small sclerotic foci in the thoracic lower lumbar spine, sacrum, left 12th rib, pelvis, and right femur.  Recommend elective outpatient whole-body bone scintigraphy and PSA.  ? Abnormal CT scan 03/10/2018  ?  Acute blood loss anemia   ? Acute cardioembolic stroke ([place])   ? Acute cerebrovascular accident (CVA) ([place]) 11/03/2018  ? 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 ARF (acute renal failure) ([place]) 4/21/201

## 2021-06-27 ENCOUNTER — Telehealth: Payer: Self-pay

## 2021-06-27 NOTE — Telephone Encounter (Signed)
Referral ordered and all documents faxed to Dr. Reggy Eye office at [facility]. ?

## 2021-06-28 NOTE — Telephone Encounter (Signed)
Returned call to [facility], who state that Dr. Pryor Ochoa had reveiwed notes and had referred pt to [facility] due to mass being too big.  [facility]  [phone]) and confirmed that there was a referral in place. They stated that patient would just need to call and set up appt.  ? ?Contacted Daniel Weiss and informed him of the reason why Dr. Pryor Ochoa referred him to [facility]. Pt verbalized undersanding and will contact [facility] to set up appt. Phone number provided.  ?

## 2021-07-08 ENCOUNTER — Ambulatory Visit (INDEPENDENT_AMBULATORY_CARE_PROVIDER_SITE_OTHER): Payer: Medicaid Other

## 2021-07-08 DIAGNOSIS — I255 Ischemic cardiomyopathy: Secondary | ICD-10-CM

## 2021-07-08 DIAGNOSIS — I5042 Chronic combined systolic (congestive) and diastolic (congestive) heart failure: Secondary | ICD-10-CM | POA: Diagnosis not present

## 2021-07-08 LAB — ECHOCARDIOGRAM COMPLETE
AR max vel: 2.55 cm2
AV Area VTI: 2.46 cm2
AV Area mean vel: 2.4 cm2
AV Mean grad: 5 mmHg
AV Peak grad: 10.1 mmHg
AV Vena cont: 0.4 cm
Ao pk vel: 1.59 m/s
Area-P 1/2: 3.11 cm2
Calc EF: 44 %
MV M vel: 5.45 m/s
MV Peak grad: 118.8 mmHg
P 1/2 time: 643 msec
S' Lateral: 4.2 cm
Single Plane A2C EF: 47.3 %
Single Plane A4C EF: 44.6 %

## 2021-07-08 MED ORDER — PERFLUTREN LIPID MICROSPHERE
1.0000 mL | INTRAVENOUS | Status: AC | PRN
  Administered 2021-07-08: 4 mL via INTRAVENOUS

## 2021-07-15 ENCOUNTER — Other Ambulatory Visit: Payer: Self-pay

## 2021-07-15 DIAGNOSIS — I255 Ischemic cardiomyopathy: Secondary | ICD-10-CM

## 2021-07-15 DIAGNOSIS — I5042 Chronic combined systolic (congestive) and diastolic (congestive) heart failure: Secondary | ICD-10-CM

## 2021-07-18 ENCOUNTER — Encounter: Payer: Self-pay | Admitting: Oncology

## 2021-07-27 ENCOUNTER — Other Ambulatory Visit: Payer: Self-pay | Admitting: Nurse Practitioner

## 2021-07-27 ENCOUNTER — Other Ambulatory Visit: Payer: Self-pay | Admitting: Cardiology

## 2021-07-29 ENCOUNTER — Encounter: Payer: Self-pay | Admitting: Cardiology

## 2021-07-29 ENCOUNTER — Ambulatory Visit (INDEPENDENT_AMBULATORY_CARE_PROVIDER_SITE_OTHER): Payer: Medicaid Other | Admitting: Cardiology

## 2021-07-29 VITALS — BP 174/88 | HR 82 | Ht 72.0 in | Wt 168.0 lb

## 2021-07-29 DIAGNOSIS — I5022 Chronic systolic (congestive) heart failure: Secondary | ICD-10-CM | POA: Diagnosis not present

## 2021-07-29 DIAGNOSIS — I447 Left bundle-branch block, unspecified: Secondary | ICD-10-CM

## 2021-07-29 DIAGNOSIS — I255 Ischemic cardiomyopathy: Secondary | ICD-10-CM | POA: Diagnosis not present

## 2021-07-29 NOTE — Patient Instructions (Signed)
Medication Instructions:  Your physician recommends that you continue on your current medications as directed. Please refer to the Current Medication list given to you today.  *If you need a refill on your cardiac medications before your next appointment, please call your pharmacy*   Lab Work: None ordered   Testing/Procedures: None ordered   Follow-Up: At [facility], you and your health needs are our priority.  As part of our continuing mission to provide you with exceptional heart care, we have created designated Provider Care Teams.  These Care Teams include your primary Cardiologist (physician) and Advanced Practice Providers (APPs -  Physician Assistants and Nurse Practitioners) who all work together to provide you with the care you need, when you need it.  Remote monitoring is used to monitor your Pacemaker or ICD from home. This monitoring reduces the number of office visits required to check your device to one time per year. It allows [place] to keep an eye on the functioning of your device to ensure it is working properly. You are scheduled for a device check from home on 09/05/2021. You may send your transmission at any time that day. If you have a wireless device, the transmission will be sent automatically. After your physician reviews your transmission, you will receive a postcard with your next transmission date.  Your next appointment:   1 year(s)  The format for your next appointment:   In Person  Provider:   Allegra Lai, MD    Thank you for choosing [facility]!!   Trinidad Curet, RN [phone]    Other Instructions   Important Information About [place]

## 2021-07-29 NOTE — Progress Notes (Signed)
Electrophysiology Office Note   Date:  07/29/2021   ID:  Daniel Weiss, DOB [birth year], MRN [id number]  PCP:  Azzie Glatter, FNP  Cardiologist:  Ellyn Hack Primary Electrophysiologist:  Godfrey Tritschler Meredith Leeds, MD    Chief Complaint: CHF   History of Present Illness: Daniel Weiss is a 61 y.o. male who is being seen today for the evaluation of CHF at the request of Azzie Glatter, [place]. Presenting today for electrophysiology evaluation.  He has a history significant for anterior STEMI with PCI to the LAD in 2019, ischemic cardiomyopathy with chronic combined systolic and diastolic heart failure, left bundle branch block, CVA.  He is status post [place] CRT-D implanted 06/06/2020.  Today, denies symptoms of palpitations, chest pain, shortness of breath, orthopnea, PND, lower extremity edema, claudication, dizziness, presyncope, syncope, bleeding, or neurologic sequela. The patient is tolerating medications without difficulties.  Since being seen he has done well.  He has no chest pain.  He is not short of breath at rest but he does get short of breath when he exerts himself.  He states that he uses an inhaler which greatly improves his shortness of breath.  Aside from that, he has no complaints.   Past Medical History:  Diagnosis Date   Abnormal chest CT 02/2018   a. Ectatic 3 cm Ao arch - rec f/u outpt imaging w/ CTA or MRA. Ectatic atheromatous abd Ao @ risk for aneurysm - rec f/u u/s in 5 yrs. Marked prostatic enlargement w/ scattered small sclerotic foci in the thoracic lower lumbar spine, sacrum, left 12th rib, pelvis, and right femur.  Recommend elective outpatient whole-body bone scintigraphy and PSA.   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 02/2018 ACS/PCI: LM nl, LAD 85p (3.5x15 Sierra DES), D1 45ost, RI 55ost, RCA nl, EF 25-35%.   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 02/2018 Echo: EF 35-40% (LV gram on cath was 25-30%), mod, diff HK. mid-apicalanteroseptal, ant, and apical sev HK. RVH.-->  No notable change on echo from June 2020 and August 2020.   Localized swelling, mass or lump of neck    Long term (current) use of anticoagulants 11/16/2018   Malnutrition of moderate degree 11/11/2018   Mass of right side of neck 07/07/2014   Myocardial infarction [facility])    Neck mass    Right Neck mass - US neck showed 6.3 cm complex, partially cystic, partially solid mass.  CT neck showed 2 cm mass from  parotid gland on right, with 3x4x5cm  Possible metastatic lymphadenopathy.  we recommended following up with Dr. Redmond Baseman ENT for biopsy of this.      Non-ST elevation (NSTEMI) myocardial infarction ([place]) 03/06/2018   NSTEMI (non-ST elevated myocardial infarction) ([place]) 02/2018   85% pLAD - DES PCI    Paresthesia 07/07/2014   Prostate cancer ([place]) 03/10/2018   Prostate cancer metastatic to multiple sites [facility]) 11/2018   Right sided weakness 07/07/2014   Stroke [facility])    TIA (transient ischemic attack) 2016    Warthin's tumor    Past Surgical History:  Procedure Laterality Date   BIV ICD INSERTION CRT-D N/A 06/06/2020   Procedure: BIV ICD INSERTION CRT-D;  Surgeon: Constance Haw, MD;  Location: [place] CV LAB;  Service: Cardiovascular;  Laterality: N/A;   COLONOSCOPY WITH PROPOFOL N/A 02/27/2020   Procedure: COLONOSCOPY WITH PROPOFOL;  Surgeon: Yetta Flock, MD;  Location: [place];  Service: Gastroenterology;  Laterality: N/A;   CORONARY STENT INTERVENTION N/A 03/06/2018   Procedure: CORONARY STENT INTERVENTION;  Surgeon: [name], MD;  Location: [place] CV LAB;  Service: Cardiovascular: Proximal LAD 85% stenosis (and D1): DES PCI with Xience Sierra DES 3.5 mm x 15 mm - 3.9 mm   ESOPHAGOGASTRODUODENOSCOPY (EGD) WITH PROPOFOL N/A 02/27/2020   Procedure: ESOPHAGOGASTRODUODENOSCOPY (EGD) WITH PROPOFOL;  Surgeon: Yetta Flock, MD;  Location: [facility] ENDOSCOPY;  Service: Gastroenterology;  Laterality: N/A;   HEMOSTASIS CLIP PLACEMENT  02/27/2020   Procedure: HEMOSTASIS CLIP PLACEMENT;  Surgeon: Yetta Flock, MD;  Location: [place] ENDOSCOPY;  Service: Gastroenterology;;   HOT HEMOSTASIS N/A 02/27/2020   Procedure: HOT HEMOSTASIS (ARGON PLASMA COAGULATION/BICAP);  Surgeon: Yetta Flock, MD;  Location[facility];  Service: Gastroenterology;  Laterality: N/A;   INTRAOPERATIVE TRANSTHORACIC ECHOCARDIOGRAM  03/06/2018   (Peri-MI) mild reduced EF 35-40%.  Diffuse hypokinesis (severe HK of the mid-apical anteroseptal, anterior and apical myocardium consistent with LAD infarct).  Unable to assess diastolic function.  Paradoxical septal motion likely related to his LBBB.  RV hypertrophy noted.  Trivial pericardial effusion noted.    LEFT HEART CATH AND CORONARY ANGIOGRAPHY N/A 03/06/2018   Procedure: LEFT HEART CATH AND CORONARY ANGIOGRAPHY;  Surgeon: [name], MD;  Location: [place] CV LAB;  Service: Cardiovascular: Proximal LAD 85% involving D1 45%.   Ostial RI 55%.  Severe LV dysfunction EF 25 to 30%.  1+ MR.  Only minimally elevated LVEDP.   ORCHIECTOMY Right 01/17/2020   Procedure: ORCHIECTOMY;  Surgeon: Abbie Sons, MD;  Location: [facility] ORS;  Service: Urology;  Laterality: Right;   POLYPECTOMY  02/27/2020   Procedure: POLYPECTOMY;  Surgeon: Yetta Flock, MD;  Location: [facility] ENDOSCOPY;  Service: Gastroenterology;;   PROSTATE BIOPSY N/A 01/17/2020   Procedure: BIOPSY TRANSRECTAL ULTRASONIC PROSTATE (TUBP);  Surgeon: Abbie Sons, MD;  Location: [facility] ORS;  Service: Urology;  Laterality: N/A;   TRANSTHORACIC ECHOCARDIOGRAM  10/2018   (Most recent echo, to evaluate for TIA/CVA) : Moderate reduced EF 35 to 40%.  Moderate concentric LVH.  Mild dyskinesis of the apex and severe hypokinesis of mid apical anteroseptal and anterior wall.  Unable to assess RV pressures.  Aortic sclerosis with no stenosis.  No significant change seen     Current Outpatient Medications  Medication Sig Dispense Refill   bicalutamide (CASODEX) 50 MG tablet Take 1 tablet by mouth twice daily 60 tablet 0   clopidogrel (PLAVIX) 75 MG tablet Take 1 tablet (75 mg total) by mouth daily.  90 tablet 3   docusate sodium (COLACE) 100 MG capsule Take 1 capsule (100 mg total) by mouth daily. 30 capsule 3   ezetimibe (ZETIA) 10 MG tablet Take 1 tablet by mouth once daily 90 tablet 0   ferrous sulfate 325 (65 FE) MG EC tablet Take 1 tablet (325 mg total) by mouth 2 (two) times daily with a meal. 60 tablet 3   furosemide (LASIX) 20 MG tablet May take 20 mg tablet as needed for increase shortness of breath or swelling 20 tablet 6   metoprolol succinate (TOPROL-XL) 50 MG 24 hr tablet Take 1 tablet (50 mg total) by mouth daily. Take with or immediately following a meal. 90 tablet 3   nitroGLYCERIN (NITROSTAT) 0.4 MG SL tablet Place 1 tablet (0.4 mg total) under the tongue every 5 (five) minutes as needed for chest pain. 25 tablet 3   ondansetron (ZOFRAN) 4 MG tablet Take 4 mg  by mouth every 4 (four) hours as needed for nausea or vomiting.     PROAIR HFA 108 (90 Base) MCG/ACT inhaler INHALE 2 PUFFS INTO THE LUNGS EVERY 6 HOURS AS NEEDED FOR WHEEZING OR  FOR SHORTNESS OF BREATH 9 g 0   rosuvastatin (CRESTOR) 40 MG tablet Take 1 tablet (40 mg total) by mouth daily. 90 tablet 3   sacubitril-valsartan (ENTRESTO) 97-103 MG Take 1 tablet by mouth 2 (two) times daily. 180 tablet 2   spironolactone (ALDACTONE) 25 MG tablet Take 1 tablet (25 mg total) by mouth daily. 90 tablet 3   tamsulosin (FLOMAX) 0.4 MG CAPS capsule Take 1 capsule (0.4 mg total) by mouth daily after supper. 90 capsule 3   traZODone (DESYREL) 50 MG tablet Take 0.5-1 tablets (25-50 mg total) by mouth at bedtime as needed for sleep. 30 tablet 6   venlafaxine XR (EFFEXOR XR) 37.5 MG 24 hr capsule Take 1 capsule (37.5 mg total) by mouth daily with breakfast. 30 capsule 1   No current facility-administered medications for this visit.   Facility-Administered Medications Ordered in Other Visits  Medication Dose Route Frequency Provider Last Rate Last Admin   leuprolide (6 Month) (ELIGARD) injection 45 mg  45 mg Subcutaneous Once Earlie Server, MD        Allergies:   Patient has no known allergies.   Social History:  The patient  reports that he has been smoking cigarettes. He has never used smokeless tobacco. He reports current alcohol use of about 2.0 standard drinks per week. He reports current drug use. Frequency: 3.00 times per week. Drug: Marijuana.   Family History:  The patient's family history includes Breast cancer in an other family member; Diabetes type II in his mother; Hypertension in his mother; Leukemia in an other family member; Stroke in his brother and mother.   ROS:  Please see the history of present illness.   Otherwise, review of systems is positive for none.   All other systems are reviewed and negative.   PHYSICAL EXAM: VS:  BP (!) 174/88   Pulse 82   Ht 6' (1.829 m)   Wt 168 lb (76.2 kg)    BMI 22.78 kg/m  , BMI Body mass index is 22.78 kg/m. GEN: Well nourished, well developed, in no acute distress  HEENT: normal  Neck: no JVD, carotid bruits, or masses Cardiac: RRR; no murmurs, rubs, or gallops,no edema  Respiratory:  clear to auscultation bilaterally, normal work of breathing GI: soft, nontender, nondistended, + BS MS: no deformity or atrophy  Skin: warm  and dry, device site well healed Neuro:  Strength and sensation are intact Psych: euthymic mood, full affect  EKG:  EKG is ordered today. Personal review of the ekg ordered shows A sense, V paced  Personal review of the device interrogation today. Results in [place]: 06/24/2021: ALT 15; BUN 12; Creatinine, Ser 0.82; Hemoglobin 12.4; Platelets 431; Potassium 4.0; Sodium 134    Lipid Panel     Component Value Date/Time   CHOL 180 02/23/2020 0251   CHOL 133 06/07/2019 1255   TRIG 123 02/23/2020 0251   HDL 54 02/23/2020 0251   HDL 56 06/07/2019 1255   CHOLHDL 3.3 02/23/2020 0251   VLDL 25 02/23/2020 0251   LDLCALC 101 (H) 02/23/2020 0251   LDLCALC 49 06/07/2019 1255     Wt Readings from Last 3 Encounters:  07/29/21 168 lb (76.2 kg)  06/26/21 167 lb 1.6 oz (75.8 kg)  05/16/21 167 lb 3.2 oz (75.8 kg)      Other studies Reviewed: Additional studies/ records that were reviewed today include: TTE 02/23/20  Review of the above records today demonstrates:   1. Akinesis of the distal anteroseptal and apical walls with overall  moderate to severe LV dysfunction.   2. Left ventricular ejection fraction, by estimation, is 30 to 35%. The  left ventricle has moderate to severely decreased function. The left  ventricle demonstrates regional wall motion abnormalities (see scoring  diagram/findings for description). Left  ventricular diastolic parameters are consistent with Grade II diastolic  dysfunction (pseudonormalization).   3. Right ventricular systolic function is normal. The right  ventricular  size is normal.   4. The mitral valve is normal in structure. No evidence of mitral valve  regurgitation. No evidence of mitral stenosis.   5. The aortic valve has an indeterminant number of cusps. Aortic valve  regurgitation is trivial. No aortic stenosis is present.   6. The inferior vena cava is normal in size with greater than 50%  respiratory variability, suggesting right atrial pressure of 3 mmHg.    ASSESSMENT AND PLAN:  1.  Chronic systolic heart failure due to ischemic cardiomyopathy: NYHA class II.  Currently on optimal medical therapy.  Is status post [place] CRT-D implanted 06/06/2020.  Device functioning appropriately.  No changes at this time.  2.  Coronary artery disease: Status post LAD stent.  No current chest pain.  Plan per primary cardiology.  3.  Hypertension: Elevated today.  He has not yet taken his morning medications.  He states it is normal at home.  4.  Hyperlipidemia: Continue statin per primary cardiology  5.  SVT: Episodes noted on device interrogation.  Currently on metoprolol.   Current medicines are reviewed at length with the patient today.   The patient does not have concerns regarding his medicines.  The following changes were made today: None  Labs/ tests ordered today include:  Orders Placed This Encounter  Procedures   EKG 12-Lead      Disposition:   FU with Rebecca Motta 12 months  Signed, Daymien Goth Meredith Leeds, MD  07/29/2021 2:36 PM     [place] [address] [place] [address] [phone] (office) [phone] (fax)

## 2021-09-05 ENCOUNTER — Ambulatory Visit (INDEPENDENT_AMBULATORY_CARE_PROVIDER_SITE_OTHER): Payer: Medicaid Other

## 2021-09-05 DIAGNOSIS — I255 Ischemic cardiomyopathy: Secondary | ICD-10-CM

## 2021-09-05 LAB — CUP PACEART REMOTE DEVICE CHECK
Battery Remaining Longevity: 63 mo
Battery Remaining Percentage: 79 %
Battery Voltage: 2.98 V
Brady Statistic AP VP Percent: 6.3 %
Brady Statistic AP VS Percent: 1 %
Brady Statistic AS VP Percent: 90 %
Brady Statistic AS VS Percent: 3.9 %
Brady Statistic RA Percent Paced: 6.3 %
Date Time Interrogation Session: 20230629020028
HighPow Impedance: 66 Ohm
Implantable Lead Implant Date: 20220330
Implantable Lead Implant Date: 20220330
Implantable Lead Implant Date: 20220330
Implantable Lead Location: 753858
Implantable Lead Location: 753859
Implantable Lead Location: 753860
Implantable Pulse Generator Implant Date: 20220330
Lead Channel Impedance Value: 340 Ohm
Lead Channel Impedance Value: 400 Ohm
Lead Channel Impedance Value: 750 Ohm
Lead Channel Pacing Threshold Amplitude: 0.75 V
Lead Channel Pacing Threshold Amplitude: 0.75 V
Lead Channel Pacing Threshold Amplitude: 1.75 V
Lead Channel Pacing Threshold Pulse Width: 0.5 ms
Lead Channel Pacing Threshold Pulse Width: 0.5 ms
Lead Channel Pacing Threshold Pulse Width: 0.5 ms
Lead Channel Sensing Intrinsic Amplitude: 1.2 mV
Lead Channel Sensing Intrinsic Amplitude: 12 mV
Lead Channel Setting Pacing Amplitude: 2 V
Lead Channel Setting Pacing Amplitude: 2.5 V
Lead Channel Setting Pacing Amplitude: 3 V
Lead Channel Setting Pacing Pulse Width: 0.5 ms
Lead Channel Setting Pacing Pulse Width: 0.5 ms
Lead Channel Setting Sensing Sensitivity: 0.5 mV
Pulse Gen Serial Number: 111039291

## 2021-09-13 ENCOUNTER — Telehealth: Payer: Self-pay | Admitting: Oncology

## 2021-09-13 NOTE — Telephone Encounter (Signed)
Called pt to notify him of appt change for 07/21.Marland KitchenKJ

## 2021-09-19 NOTE — Progress Notes (Signed)
Remote ICD transmission.   

## 2021-09-25 ENCOUNTER — Ambulatory Visit: Payer: Medicaid Other

## 2021-09-25 ENCOUNTER — Other Ambulatory Visit: Payer: Medicaid Other

## 2021-09-27 ENCOUNTER — Ambulatory Visit: Payer: Medicaid Other | Admitting: Oncology

## 2021-10-11 ENCOUNTER — Other Ambulatory Visit: Payer: Medicaid Other

## 2021-10-11 ENCOUNTER — Ambulatory Visit: Payer: Medicaid Other

## 2021-10-14 ENCOUNTER — Ambulatory Visit: Payer: Medicaid Other | Admitting: Oncology

## 2021-10-23 ENCOUNTER — Other Ambulatory Visit: Payer: Self-pay | Admitting: Cardiology

## 2021-10-28 ENCOUNTER — Ambulatory Visit: Admission: RE | Admit: 2021-10-28 | Payer: Medicaid Other | Source: Ambulatory Visit

## 2021-11-04 ENCOUNTER — Inpatient Hospital Stay: Payer: Medicaid Other | Attending: Oncology

## 2021-11-04 DIAGNOSIS — Z79899 Other long term (current) drug therapy: Secondary | ICD-10-CM | POA: Diagnosis not present

## 2021-11-04 DIAGNOSIS — C61 Malignant neoplasm of prostate: Secondary | ICD-10-CM | POA: Diagnosis present

## 2021-11-04 DIAGNOSIS — C7951 Secondary malignant neoplasm of bone: Secondary | ICD-10-CM | POA: Insufficient documentation

## 2021-11-04 DIAGNOSIS — Z923 Personal history of irradiation: Secondary | ICD-10-CM | POA: Insufficient documentation

## 2021-11-04 LAB — COMPREHENSIVE METABOLIC PANEL
ALT: 12 U/L (ref 0–44)
AST: 20 U/L (ref 15–41)
Albumin: 3.9 g/dL (ref 3.5–5.0)
Alkaline Phosphatase: 85 U/L (ref 38–126)
Anion gap: 8 (ref 5–15)
BUN: 17 mg/dL (ref 8–23)
CO2: 24 mmol/L (ref 22–32)
Calcium: 9.2 mg/dL (ref 8.9–10.3)
Chloride: 104 mmol/L (ref 98–111)
Creatinine, Ser: 0.76 mg/dL (ref 0.61–1.24)
GFR, Estimated: >60 mL/min (ref >60)
Glucose, Bld: 104 mg/dL — ABNORMAL HIGH (ref 70–99)
Potassium: 4.1 mmol/L (ref 3.5–5.1)
Sodium: 136 mmol/L (ref 135–145)
Total Bilirubin: 0.3 mg/dL (ref 0.3–1.2)
Total Protein: 7.7 g/dL (ref 6.5–8.1)

## 2021-11-04 LAB — CBC WITH DIFFERENTIAL/PLATELET
Abs Immature Granulocytes: 0.02 10*3/uL (ref 0.00–0.07)
Basophils Absolute: 0 10*3/uL (ref 0.0–0.1)
Basophils Relative: 1 %
Eosinophils Absolute: 0 10*3/uL (ref 0.0–0.5)
Eosinophils Relative: 1 %
HCT: 34.6 % — ABNORMAL LOW (ref 39.0–52.0)
Hemoglobin: 11 g/dL — ABNORMAL LOW (ref 13.0–17.0)
Immature Granulocytes: 0 %
Lymphocytes Relative: 24 %
Lymphs Abs: 1.5 10*3/uL (ref 0.7–4.0)
MCH: 24.3 pg — ABNORMAL LOW (ref 26.0–34.0)
MCHC: 31.8 g/dL (ref 30.0–36.0)
MCV: 76.4 fL — ABNORMAL LOW (ref 80.0–100.0)
Monocytes Absolute: 0.8 10*3/uL (ref 0.1–1.0)
Monocytes Relative: 12 %
Neutro Abs: 4 10*3/uL (ref 1.7–7.7)
Neutrophils Relative %: 62 %
Platelets: 342 10*3/uL (ref 150–400)
RBC: 4.53 MIL/uL (ref 4.22–5.81)
RDW: 19.5 % — ABNORMAL HIGH (ref 11.5–15.5)
WBC: 6.4 10*3/uL (ref 4.0–10.5)
nRBC: 0 % (ref 0.0–0.2)

## 2021-11-04 LAB — LACTATE DEHYDROGENASE: LDH: 125 U/L (ref 98–192)

## 2021-11-05 LAB — AFP TUMOR MARKER: AFP, Serum, Tumor Marker: 2.9 ng/mL (ref 0.0–8.4)

## 2021-11-05 LAB — BETA HCG QUANT (REF LAB): hCG Quant: <1 m[IU]/mL (ref 0–3)

## 2021-11-08 ENCOUNTER — Inpatient Hospital Stay: Payer: Medicaid Other | Attending: Oncology | Admitting: Oncology

## 2021-11-08 ENCOUNTER — Inpatient Hospital Stay: Payer: Medicaid Other

## 2021-11-08 ENCOUNTER — Encounter: Payer: Self-pay | Admitting: Oncology

## 2021-11-08 VITALS — BP 141/87 | HR 79 | Temp 96.5°F | Wt 171.1 lb

## 2021-11-08 DIAGNOSIS — C7951 Secondary malignant neoplasm of bone: Secondary | ICD-10-CM | POA: Diagnosis not present

## 2021-11-08 DIAGNOSIS — I2510 Atherosclerotic heart disease of native coronary artery without angina pectoris: Secondary | ICD-10-CM | POA: Diagnosis not present

## 2021-11-08 DIAGNOSIS — Z7902 Long term (current) use of antithrombotics/antiplatelets: Secondary | ICD-10-CM | POA: Diagnosis not present

## 2021-11-08 DIAGNOSIS — D508 Other iron deficiency anemias: Secondary | ICD-10-CM

## 2021-11-08 DIAGNOSIS — R296 Repeated falls: Secondary | ICD-10-CM

## 2021-11-08 DIAGNOSIS — I252 Old myocardial infarction: Secondary | ICD-10-CM | POA: Diagnosis not present

## 2021-11-08 DIAGNOSIS — D119 Benign neoplasm of major salivary gland, unspecified: Secondary | ICD-10-CM

## 2021-11-08 DIAGNOSIS — I110 Hypertensive heart disease with heart failure: Secondary | ICD-10-CM | POA: Diagnosis not present

## 2021-11-08 DIAGNOSIS — C61 Malignant neoplasm of prostate: Secondary | ICD-10-CM

## 2021-11-08 DIAGNOSIS — Z79899 Other long term (current) drug therapy: Secondary | ICD-10-CM | POA: Insufficient documentation

## 2021-11-08 DIAGNOSIS — C801 Malignant (primary) neoplasm, unspecified: Secondary | ICD-10-CM

## 2021-11-08 LAB — PSA: Prostatic Specific Antigen: 2.9 ng/mL (ref 0.00–4.00)

## 2021-11-08 NOTE — Progress Notes (Signed)
Hematology/Oncology Progress note [phone] [phone]      Patient Care Team: Azzie Glatter, FNP as PCP - General (Family Medicine) [name], MD as PCP - Cardiology (Cardiology) Constance Haw, MD as PCP - Electrophysiology (Cardiology) McKenzie, Candee Furbish, MD as Consulting Physician (Urology)  REFERRING PROVIDER: Azzie Glatter, FNP  CHIEF COMPLAINTS/REASON FOR VISIT:  Follow up for prostate cancer and germ cell tumor  HISTORY OF PRESENTING ILLNESS:   Daniel Weiss is a  61 y.o.  male with PMH listed below was seen in consultation at the request of  Azzie Glatter, FNP  for evaluation of presumed prostate cancer Patient recently switched his care to [facility] and was seen by Dr. [name]. He was previously followed up at [facility] I reviewed Dr. Dene Gentry note. Patient was diagnosed with presumed prostate cancer on 10/2018 with a PSA of 509, incidental findings of multiple sclerotic lesions on CT-chest abdomen pelvis during work-up for a CVA.  His previous urology care was with Dr. Alyson Ingles. 11/05/2018 bone scan showed solitary punctate focus of activity in the left first sacral segment corresponding to a 9 mm mixed lytic and sclerotic metastatic's on recent CT.  The remaining numerous sclerotic osseous metastasis identified on CT do not demonstrate abnormal activity and are therefore healed.  At that time No tissue diagnosis/biopsy was obtained. Patient was started with Lupron and Casodex Lupron was eventually to Eligard [facility], PSA was 13.13, testosterone level less than 10. Casodex was discontinued in June 2021.   10/26/2019 patient switched to [facility] u[facility] and was seen by Dr. [name] Per urology note, PSA trend: 11/03/2018: 502 11/22/2018: 377 02/21/2019: 387 04/25/2019: 123 09/05/2019: 13.3  Patient gets androgen deprivation therapy through Dr.  Dene Gentry office.  Eligard '45mg'$   on 11/11/2019 -Patient was referred to heme-onc on 11/01/2019.  10/06/2019 bone scan showed resolution of punctated activity over the left sacrum.  The punctate area of increased activity over the right lower lumbar spine again noted. 10/31/2019, CT angio chest aorta showed no evidence of aortic aneurysm or dissection.  Atherosclerotic calcification spleen seen in the upper abdominal aorta.  Chronic artery disease.  No acute cardiopulmonary disease. Patient has a chronic right neck mass.  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   11/18/2019 CT abdomen pelvis with contrast showed retrocaval lymphadenopathy in the right common and external iliac chains.  Stable to minimally progressed in the interval since last CT scan 11/03/2018 Numerous sclerotic bone lesions.  Stable 3 mm subpleural right middle lobe lung nodule stable.  Aortic atherosclerosis   Patient has extensive cardiology issues.  He follows up with Dr. Ellyn Hack. Patient has history of STEMI-PCI to LAD with resultant ischemia cardiomyopathy, EF 35 to 40%, left bundle branch block with acute CVA-10/2018.  CT coronary showed nonobstructing plaquing of coronary distributions, intimal irregularity with filling defect in the ascending aorta but no LV thrombus.  And the patient is on warfarin and Plavix.  Patient was seen by thoracic surgeon Dr. Roxy Manns will recommend patient to be on Coumadin for minimum of 3 months.  With presentation with stroke, Dr. Ellyn Hack recommend at least 6 to 12 months of Coumadin. -Coumadin was discontinued by Dr. Ellyn Hack in September 2021 and switched to.  Plavix Patient lives with his sister.  He has 3 adult children.  His activity  is quite limited due to shortness of breath with exertion due to cardiology problems.  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 Tumor markers Showed normal LDH, beta hCG, AFP # 01/17/2020 scrotum ultrasound showed a small right testicular mass measuring 1.6 cm, contained an internal calcification.  Appearance was consistent with primary testicular neoplasm.  Normal size and appearance of the left testicle.  #Prostate cancer, metastatic M1 A.  On androgen deprivation therapy.  Eligard through Dr. Dene Gentry office. 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  #01/17/2020 right orchiectomy residual germ cell tumor and germ cell neoplasia in situ are not identified.  Case was discussed on tumor board.  Consensus reached a point although residual germ cell tumor/infection was not identified, the presence of scar with extensive tubular atrophy is compatible with a completely regressed germ cell tumor.  He went to [facility] for second opinion as I recommended. Due to long waiting time, he left without being seen. Dr.Rush from [facility] called and agrees with patient current treatment.  11/11/2019 Eligard '45mg'$   on  Dr. Dene Gentry office missed his appointment with urologist on 05/11/2019 for Eligard treatments.  03/14/20 PET was reviewed and discussed with patient. - small hypermetabolic lymph nodes along the RIGHT iliac vessels is most consistent with nodal metastasis. 04/12/2020- 05/08/2020  radiation to periaortic lymph nodes as well as right hemipelvis node. 06/08/2020 Eligard '45mg'$  at cancer center  # Parotid mass, radiographically stable, never officially evaluated.  Patient has an ENT evaluation.  Status post biopsy- WARTHIN TUMOR.  Negative for atypia and malignancy.  # He has had CRT-D implant.  # 12/19/2020 CT chest abdomen pelvis showed interval resolution of retrocaval and right iliac lymphadenotomy.  No new or progressive disease. Stable sclerotic lesions. Non obstructing bilateral nephrolithiasis.  #06/14/2021, CT neck soft tissue with contrast showed multiple bilateral parotid masses, largest lesion on the right has increased the size, now 5.1 x 2.9 x 4.9 cm.  Previously 4.1 cm in 2016.  INTERVAL HISTORY Daniel Weiss is a 61 y.o. male who has above history reviewed by me today presents for follow up visit for management of metastatic prostate cancer and germ cell tumor.  He reports feeling well.  + hot flash No new complaints.   Review of Systems  Constitutional:  Positive for fatigue. Negative for appetite change, chills, fever and unexpected weight change.  HENT:   Negative for hearing loss and voice change.        Chronic neck mass  Eyes:  Negative for eye problems and icterus.  Respiratory:  Positive for shortness of breath. Negative for chest tightness and cough.   Cardiovascular:  Negative for chest pain and leg swelling.  Gastrointestinal:  Negative for abdominal distention and abdominal pain.  Endocrine: Positive for hot flashes.  Genitourinary:  Negative for difficulty urinating, dysuria and frequency.   Musculoskeletal:  Negative for arthralgias.  Skin:  Negative for itching and rash.  Neurological:  Negative for dizziness, light-headedness and numbness.  Hematological:  Negative for adenopathy. Does not bruise/bleed easily.  Psychiatric/Behavioral:  Negative for confusion.     MEDICAL HISTORY:  Past Medical History:  Diagnosis Date   Abnormal chest CT 02/2018   a. Ectatic 3 cm Ao arch - rec f/u outpt imaging w/ CTA or MRA. Ectatic atheromatous  abd Ao @ risk for aneurysm - rec f/u u/s in 5 yrs. Marked prostatic enlargement w/ scattered small sclerotic foci in the thoracic lower lumbar spine, sacrum, left 12th rib, pelvis, and right femur.  Recommend elective outpatient whole-body bone scintigraphy and PSA.   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 02/2018 ACS/PCI: LM nl, LAD 85p (3.5x15 Sierra DES), D1 45ost, RI 55ost, RCA nl, EF 25-35%.   Cardiac murmur    Chest pain on exertion 11/28/2019   Chronic combined systolic and diastolic CHF, NYHA class 2 ([place]) 04/17/2020   Complete left bundle branch block (LBBB) 2016   Coronary artery disease involving native coronary artery of native heart with angina pectoris ([place]) 03/10/2018   Dyspnea    Encephalopathy, hypertensive    Essential hypertension 07/07/2014   Germ cell tumor ([place]) 12/22/2019   Goals of care, counseling/discussion 12/22/2019   Hematemesis without nausea    HFrEF (heart failure with reduced ejection fraction) ([place])    History of CVA (cerebrovascular accident) 11/23/2018   History of non-ST elevation myocardial infarction (NSTEMI) 02/21/2019   Hypertension    Hypokalemia    Hyponatremia 06/29/2015   Ischemic cardiomyopathy 02/2018   a. 02/2018 Echo: EF 35-40% (LV gram on cath was 25-30%), mod, diff HK. mid-apicalanteroseptal, ant, and apical sev HK. RVH.-->  No notable change on echo from June 2020 and August 2020.   Localized swelling, mass or lump of neck    Long term (current) use of anticoagulants 11/16/2018   Malnutrition of moderate degree 11/11/2018   Mass of right side of neck 07/07/2014   Myocardial infarction [facility])    Neck mass    Right Neck mass - US neck showed 6.3 cm complex, partially cystic, partially solid mass.  CT neck showed 2 cm mass from parotid gland on right, with 3x4x5cm  Possible metastatic lymphadenopathy.  we recommended following up with Dr. Redmond Baseman ENT for biopsy of this.      Non-ST elevation (NSTEMI) myocardial infarction ([place]) 03/06/2018   NSTEMI (non-ST elevated myocardial infarction) ([place]) 02/2018   85% pLAD - DES PCI     Paresthesia 07/07/2014   Prostate cancer ([place]) 03/10/2018   Prostate cancer metastatic to multiple sites [facility]) 11/2018   Right sided weakness 07/07/2014   Stroke [facility])    TIA (transient ischemic attack) 2016   Warthin's tumor     SURGICAL HISTORY: Past Surgical History:  Procedure Laterality Date   BIV ICD INSERTION CRT-D N/A 06/06/2020   Procedure: BIV ICD INSERTION CRT-D;  Surgeon: Constance Haw, MD;  Location: [place] [facility];  Service: Cardiovascular;  Laterality: N/A;   COLONOSCOPY WITH PROPOFOL N/A 02/27/2020   Procedure: COLONOSCOPY WITH PROPOFOL;  Surgeon: Yetta Flock, MD;  Location: [place];  Service: Gastroenterology;  Laterality: N/A;   CORONARY STENT INTERVENTION N/A 03/06/2018   Procedure: CORONARY STENT INTERVENTION;  Surgeon: [name], MD;  Location: [place] [facility];  Service: Cardiovascular: Proximal LAD 85% stenosis (and D1): DES PCI with Xience Sierra DES 3.5 mm x 15 mm - 3.9 mm   ESOPHAGOGASTRODUODENOSCOPY (EGD) WITH PROPOFOL N/A 02/27/2020   Procedure: ESOPHAGOGASTRODUODENOSCOPY (EGD) WITH PROPOFOL;  Surgeon: Yetta Flock, MD;  Location:  [place] END[facility]COPY;  Service: Gastroenterology;  Laterality: N/A;   HEMOSTASIS CLIP PLACEMENT  02/27/2020   Procedure: HEMOSTASIS CLIP PLACEMENT;  Surgeon: Yetta Flock, MD;  Location: [place] [facility];  Service: Gastroenterology;;   HOT HEMOSTASIS N/A 02/27/2020   Procedure: HOT HEMOSTASIS (ARGON PLASMA COAGULATION/BICAP);  Surgeon: Yetta Flock, MD;  Location: [facility];  Service: Gastroenterology;  Laterality: N/A;   INTRAOPERATIVE TRANSTHORACIC ECHOCARDIOGRAM  03/06/2018   (Peri-MI) mild reduced EF 35-40%.  Diffuse hypokinesis (severe HK of the mid-apical anteroseptal, anterior and apical myocardium consistent with LAD infarct).  Unable to assess diastolic function.  Paradoxical septal motion likely related to his LBBB.  RV hypertrophy noted.  Trivial pericardial effusion noted.     LEFT HEART CATH AND CORONARY ANGIOGRAPHY N/A 03/06/2018   Procedure: LEFT HEART CATH AND CORONARY ANGIOGRAPHY;  Surgeon: [name], MD;  Location: [place] [facility];  Service: Cardiovascular: Proximal LAD 85% involving D1 45%.  Ostial RI 55%.  Severe LV dysfunction EF 25 to 30%.  1+ MR.  Only minimally elevated LVEDP.   ORCHIECTOMY Right 01/17/2020   Procedure: ORCHIECTOMY;  Surgeon: Abbie Sons, MD;  Location: [facility] ORS;  Service: Urology;  Laterality: Right;   POLYPECTOMY  02/27/2020   Procedure: POLYPECTOMY;  Surgeon: Yetta Flock, MD;  Location: [facility] ENDOSCOPY;  Service: Gastroenterology;;   PROSTATE BIOPSY N/A 01/17/2020   Procedure: BIOPSY TRANSRECTAL ULTRASONIC PROSTATE (TUBP);  Surgeon: Abbie Sons, MD;  Location: [facility] ORS;  Service: Urology;  Laterality: N/A;   TRANSTHORACIC ECHOCARDIOGRAM  10/2018   (Most recent echo, to evaluate for TIA/CVA) : Moderate reduced EF 35 to 40%.  Moderate concentric LVH.  Mild dyskinesis of the apex and severe hypokinesis of mid apical anteroseptal and anterior wall.  Unable to assess RV pressures.  Aortic sclerosis with no stenosis.  No significant change seen    SOCIAL HISTORY: Social History   Socioeconomic History   Marital status: Legally Separated    Spouse name: Dorothenia   Number of children: Not on file   Years of education: Not on file   Highest education level: Not on file  Occupational History   Occupation: cook  Tobacco Use   Smoking status: Some Days    Packs/day: 0.00    Years: 0.00    Total pack years: 0.00    Types: Cigarettes   Smokeless tobacco: Never   Tobacco comments:    quit three days ago  Vaping Use   Vaping Use: Never used  Substance and Sexual Activity   Alcohol use: Yes    Alcohol/week: 2.0 standard drinks of alcohol    Types: 2 Cans of beer per week    Comment: 2 Beers daily.   Drug use: Yes    Frequency: 3.0 times per week    Types: Marijuana   Sexual activity: Not Currently  Other  Topics Concern   Not on file  Social History Narrative   Currently not working.  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  Now has cancer.   Social Determinants of Health   Financial Resource Strain: Not on file  Food Insecurity: Not on file  Transportation Needs: No Transportation Needs (11/22/2018)   PRAPARE - Hydrologist (Medical): No    Lack of Transportation (Non-Medical): No  Physical Activity: Not on file  Stress: Not on file  Social Connections: Not on file  Intimate Partner Violence: Not on file  FAMILY HISTORY: Family History  Problem Relation Age of Onset   Stroke Mother    Hypertension Mother    Diabetes type II Mother    Leukemia Other    Breast cancer Other    Stroke Brother    CAD Neg Hx     ALLERGIES:  has No Known Allergies.  MEDICATIONS:  Current Outpatient Medications  Medication Sig Dispense Refill   bicalutamide (CASODEX) 50 MG tablet Take 1 tablet by mouth twice daily 60 tablet 0   clopidogrel (PLAVIX) 75 MG tablet Take 1 tablet by mouth once daily 90 tablet 3   docusate sodium (COLACE) 100 MG capsule Take 1 capsule (100 mg total) by mouth daily. 30 capsule 3   ezetimibe (ZETIA) 10 MG tablet Take 1 tablet by mouth once daily 90 tablet 0   ferrous sulfate 325 (65 FE) MG EC tablet Take 1 tablet (325 mg total) by mouth 2 (two) times daily with a meal. 60 tablet 3   furosemide (LASIX) 20 MG tablet May take 20 mg tablet as needed for increase shortness of breath or swelling 20 tablet 6   metoprolol succinate (TOPROL-XL) 50 MG 24 hr tablet Take 1 tablet (50 mg total) by mouth daily. Take with or immediately following a meal. 90 tablet 3   rosuvastatin (CRESTOR) 40 MG tablet Take 1 tablet (40 mg total) by mouth daily. 90 tablet 3   sacubitril-valsartan (ENTRESTO) 97-103 MG Take 1 tablet by mouth 2 (two) times daily. 180 tablet 2   spironolactone (ALDACTONE) 25 MG tablet Take 1 tablet (25 mg total) by  mouth daily. 90 tablet 3   tamsulosin (FLOMAX) 0.4 MG CAPS capsule Take 1 capsule (0.4 mg total) by mouth daily after supper. 90 capsule 3   traZODone (DESYREL) 50 MG tablet Take 0.5-1 tablets (25-50 mg total) by mouth at bedtime as needed for sleep. 30 tablet 6   venlafaxine XR (EFFEXOR XR) 37.5 MG 24 hr capsule Take 1 capsule (37.5 mg total) by mouth daily with breakfast. 30 capsule 1   nitroGLYCERIN (NITROSTAT) 0.4 MG SL tablet Place 1 tablet (0.4 mg total) under the tongue every 5 (five) minutes as needed for chest pain. (Patient not taking: Reported on 11/08/2021) 25 tablet 3   ondansetron (ZOFRAN) 4 MG tablet Take 4 mg by mouth every 4 (four) hours as needed for nausea or vomiting. (Patient not taking: Reported on 11/08/2021)     PROAIR HFA 108 (90 Base) MCG/ACT inhaler INHALE 2 PUFFS INTO THE LUNGS EVERY 6 HOURS AS NEEDED FOR WHEEZING OR  FOR SHORTNESS OF BREATH (Patient not taking: Reported on 11/08/2021) 9 g 0   No current facility-administered medications for this visit.   Facility-Administered Medications Ordered in Other Visits  Medication Dose Route Frequency Provider Last Rate Last Admin   leuprolide (6 Month) (ELIGARD) injection 45 mg  45 mg Subcutaneous Once Earlie Server, MD         PHYSICAL EXAMINATION: ECOG PERFORMANCE STATUS: 1 - Symptomatic but completely ambulatory Vitals:   11/08/21 1149  BP: (!) 141/87  Pulse: 79  Temp: (!) 96.5 F (35.8 C)  SpO2: 100%   Filed Weights   11/08/21 1149  Weight: 171 lb 1.6 oz (77.6 kg)    Physical Exam Constitutional:      General: He is not in acute distress.    Comments: Thin built.   HENT:     Head: Normocephalic and atraumatic.  Eyes:     General: No scleral icterus. Neck:  Comments: Palpable right cervical mass Cardiovascular:     Rate and Rhythm: Normal rate and regular rhythm.  Pulmonary:     Effort: Pulmonary effort is normal. No respiratory distress.     Breath sounds: No wheezing.  Abdominal:     General: Bowel  sounds are normal. There is no distension.     Palpations: Abdomen is soft.  Musculoskeletal:        General: No deformity. Normal range of motion.     Cervical back: Normal range of motion and neck supple.  Skin:    General: Skin is warm and dry.     Findings: No erythema or rash.  Neurological:     Mental Status: He is alert and oriented to person, place, and time. Mental status is at baseline.     Cranial Nerves: No cranial nerve deficit.     Coordination: Coordination normal.  Psychiatric:        Mood and Affect: Mood normal.     LABORATORY DATA:  I have reviewed the data as listed    Latest Ref Rng & Units 11/04/2021    9:02 AM 06/24/2021   10:22 AM 03/25/2021    9:38 AM  CBC  WBC 4.0 - 10.5 K/uL 6.4  5.6  4.2   Hemoglobin 13.0 - 17.0 g/dL 11.0  12.4  9.5   Hematocrit 39.0 - 52.0 % 34.6  38.3  29.2   Platelets 150 - 400 K/uL 342  431  353       Latest Ref Rng & Units 11/04/2021    9:02 AM 06/24/2021   10:22 AM 06/14/2021    1:50 PM  CMP  Glucose 70 - 99 mg/dL 104  106    BUN 8 - 23 mg/dL 17  12    Creatinine 0.61 - 1.24 mg/dL 0.76  0.82  0.80   Sodium 135 - 145 mmol/L 136  134    Potassium 3.5 - 5.1 mmol/L 4.1  4.0    Chloride 98 - 111 mmol/L 104  103    CO2 22 - 32 mmol/L 24  22    Calcium 8.9 - 10.3 mg/dL 9.2  9.8    Total Protein 6.5 - 8.1 g/dL 7.7  8.2    Total Bilirubin 0.3 - 1.2 mg/dL 0.3  0.3    Alkaline Phos 38 - 126 U/L 85  88    AST 15 - 41 U/L 20  23    ALT 0 - 44 U/L 12  15      Iron/TIBC/Ferritin/ %Sat    Component Value Date/Time   IRON 51 03/25/2021 0938   TIBC 519 (H) 03/25/2021 0938   FERRITIN 5 (L) 03/25/2021 0938   IRONPCTSAT 10 (L) 03/25/2021 5400      RADIOGRAPHIC STUDIES: I have personally reviewed the radiological images as listed and agreed with the findings in the report. No results found.    ASSESSMENT & PLAN:  1. Prostate cancer ([place])   2. Germ cell tumor ([place])   3. Other iron deficiency anemia   4. Warthin's tumor   5.  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  PSA nadir was on 03/25/2021.  06/24/2021, PSA is increased to  1.80. He missed CT scan appt, reschedule.   Rationale and potential side effects of darolutamide, or apalutamide reviewed and discussed with patient. Discussed with cardiology Dr. Haroldine Laws who is ok for patient to try androgen receptor inhibitors if needed.   Apalutamide: Hypertension (18% to 25%), peripheral edema (11%), Heart failure (2%), ischemic heart disease (4%) Darolutamide:  Heart failure (2%), ischemic heart disease (4%) Xtandi :  Hypertension (6% to 14%), peripheral edema (12% to 15%) Ischemic heart disease (3%) Abiateron: Edema (25% to 27%), hypertension (9% to 37%)  Cardiac arrhythmia (7%), cardiac failure (2% to 3%), chest discomfort (=4%), chest pain (=4%)  Darolutamide appears to have least cardiovascular side effects. Plan to add Darolutamide if confirmed disease progression.   #Germ cell tumor, stage II, status post radiation. Stable tumor markers.  check surveillance CT chest abdomen pelvis in the next visit.  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  Not hypermetabolic on PET scan.  #Frequent falls, secondary to lightheadedness triggered by turning his head to right.  Possible secondary to large right neck mass. Symptoms are stable. Not surgery candidate due to multiple medical problems.   # Iron deficiency anemia.  Hemoglobin has significantly improved to 12.4.  Continue oral iron supplementation.  All questions were answered. The patient knows to call the clinic with any problems questions or concerns.  Return of visit: 6 weeks to  review CT  Earlie Server, MD, PhD 11/08/2021

## 2021-11-14 ENCOUNTER — Ambulatory Visit
Admission: RE | Admit: 2021-11-14 | Discharge: 2021-11-14 | Disposition: A | Discharge status: Home / Self Care | Payer: Medicaid Other | Source: Ambulatory Visit | Attending: Oncology | Admitting: Oncology

## 2021-11-14 DIAGNOSIS — C61 Malignant neoplasm of prostate: Secondary | ICD-10-CM | POA: Insufficient documentation

## 2021-11-14 MED ORDER — IOHEXOL 300 MG/ML  SOLN
100.0000 mL | Freq: Once | INTRAMUSCULAR | Status: AC | PRN
  Administered 2021-11-14: 100 mL via INTRAVENOUS

## 2021-11-20 ENCOUNTER — Emergency Department (HOSPITAL_BASED_OUTPATIENT_CLINIC_OR_DEPARTMENT_OTHER): Payer: Medicaid Other

## 2021-11-20 ENCOUNTER — Encounter (HOSPITAL_BASED_OUTPATIENT_CLINIC_OR_DEPARTMENT_OTHER): Payer: Self-pay

## 2021-11-20 ENCOUNTER — Emergency Department (HOSPITAL_BASED_OUTPATIENT_CLINIC_OR_DEPARTMENT_OTHER)
Admission: EM | Admit: 2021-11-20 | Discharge: 2021-11-20 | Disposition: A | Discharge status: Home / Self Care | Payer: Medicaid Other | Attending: Emergency Medicine | Admitting: Emergency Medicine

## 2021-11-20 DIAGNOSIS — I509 Heart failure, unspecified: Secondary | ICD-10-CM | POA: Diagnosis not present

## 2021-11-20 DIAGNOSIS — I110 Hypertensive heart disease with heart failure: Secondary | ICD-10-CM | POA: Diagnosis not present

## 2021-11-20 DIAGNOSIS — I5042 Chronic combined systolic (congestive) and diastolic (congestive) heart failure: Secondary | ICD-10-CM | POA: Diagnosis not present

## 2021-11-20 DIAGNOSIS — Z79899 Other long term (current) drug therapy: Secondary | ICD-10-CM | POA: Insufficient documentation

## 2021-11-20 DIAGNOSIS — Z20822 Contact with and (suspected) exposure to covid-19: Secondary | ICD-10-CM | POA: Insufficient documentation

## 2021-11-20 DIAGNOSIS — R0602 Shortness of breath: Secondary | ICD-10-CM | POA: Diagnosis present

## 2021-11-20 DIAGNOSIS — Z7902 Long term (current) use of antithrombotics/antiplatelets: Secondary | ICD-10-CM | POA: Insufficient documentation

## 2021-11-20 DIAGNOSIS — Z8546 Personal history of malignant neoplasm of prostate: Secondary | ICD-10-CM | POA: Insufficient documentation

## 2021-11-20 DIAGNOSIS — I2510 Atherosclerotic heart disease of native coronary artery without angina pectoris: Secondary | ICD-10-CM | POA: Insufficient documentation

## 2021-11-20 LAB — CBC WITH DIFFERENTIAL/PLATELET
Abs Immature Granulocytes: 0.01 10*3/uL (ref 0.00–0.07)
Basophils Absolute: 0 10*3/uL (ref 0.0–0.1)
Basophils Relative: 1 %
Eosinophils Absolute: 0 10*3/uL (ref 0.0–0.5)
Eosinophils Relative: 0 %
HCT: 41.4 % (ref 39.0–52.0)
Hemoglobin: 13.4 g/dL (ref 13.0–17.0)
Immature Granulocytes: 0 %
Lymphocytes Relative: 42 %
Lymphs Abs: 2.4 10*3/uL (ref 0.7–4.0)
MCH: 24.6 pg — ABNORMAL LOW (ref 26.0–34.0)
MCHC: 32.4 g/dL (ref 30.0–36.0)
MCV: 76.1 fL — ABNORMAL LOW (ref 80.0–100.0)
Monocytes Absolute: 0.7 10*3/uL (ref 0.1–1.0)
Monocytes Relative: 12 %
Neutro Abs: 2.6 10*3/uL (ref 1.7–7.7)
Neutrophils Relative %: 45 %
Platelets: 376 10*3/uL (ref 150–400)
RBC: 5.44 MIL/uL (ref 4.22–5.81)
RDW: 19.9 % — ABNORMAL HIGH (ref 11.5–15.5)
WBC: 5.7 10*3/uL (ref 4.0–10.5)
nRBC: 0 % (ref 0.0–0.2)

## 2021-11-20 LAB — BASIC METABOLIC PANEL
Anion gap: 14 (ref 5–15)
BUN: 12 mg/dL (ref 8–23)
CO2: 21 mmol/L — ABNORMAL LOW (ref 22–32)
Calcium: 10.1 mg/dL (ref 8.9–10.3)
Chloride: 102 mmol/L (ref 98–111)
Creatinine, Ser: 1.08 mg/dL (ref 0.61–1.24)
GFR, Estimated: >60 mL/min (ref >60)
Glucose, Bld: 119 mg/dL — ABNORMAL HIGH (ref 70–99)
Potassium: 3.5 mmol/L (ref 3.5–5.1)
Sodium: 137 mmol/L (ref 135–145)

## 2021-11-20 LAB — HEPATIC FUNCTION PANEL
ALT: 14 U/L (ref 0–44)
AST: 31 U/L (ref 15–41)
Albumin: 4.6 g/dL (ref 3.5–5.0)
Alkaline Phosphatase: 103 U/L (ref 38–126)
Bilirubin, Direct: 0.1 mg/dL (ref 0.0–0.2)
Indirect Bilirubin: 0.8 mg/dL (ref 0.3–0.9)
Total Bilirubin: 0.9 mg/dL (ref 0.3–1.2)
Total Protein: 9.4 g/dL — ABNORMAL HIGH (ref 6.5–8.1)

## 2021-11-20 LAB — TROPONIN I (HIGH SENSITIVITY): Troponin I (High Sensitivity): 9 ng/L (ref <18)

## 2021-11-20 LAB — SARS CORONAVIRUS 2 BY RT PCR: SARS Coronavirus 2 by RT PCR: NEGATIVE

## 2021-11-20 LAB — BRAIN NATRIURETIC PEPTIDE: B Natriuretic Peptide: 18.3 pg/mL (ref 0.0–100.0)

## 2021-11-20 MED ORDER — IOHEXOL 350 MG/ML SOLN
100.0000 mL | Freq: Once | INTRAVENOUS | Status: AC | PRN
  Administered 2021-11-20: 100 mL via INTRAVENOUS

## 2021-11-20 NOTE — Discharge Instructions (Signed)
Your work-up today was reassuring.  No concerning cause of your shortness of breath was noted.  CT scan of the chest did not show any evidence of blood clot or other concerning findings.  Heart enzyme was normal.  He did not appear to be in heart failure exacerbation.  Continue taking your home medications as prescribed.  Have also attached information for [place] [facility] clinic.  Please call their office today to get a follow-up appointment scheduled.  For any concerning symptoms or worsening shortness of breath please return to the emergency room for evaluation.

## 2021-11-20 NOTE — ED Provider Notes (Signed)
[facility] EMERGENCY DEPARTMENT Provider Note   CSN: [id number] Arrival date & time: 11/20/21  1054     History  Chief Complaint  Patient presents with   Shortness of Breath    Kahlen Morais is a 61 y.o. male.  61 year old male presents today for evaluation of shortness of breath that has been progressively worsening for the past 3 days.  Predominantly with exertion.  Does have chronic for stational dyspnea on exam.  He has history of CHF.  He takes Lasix 20 mg daily.  Denies any peripheral edema.  Denies any fever, chills, nausea, vomiting, or chest pain.  Does have history of CAD and is status post PCI.  Reports compliance with all of his medications.  Follows with heart failure clinic.  Reports weakness in his legs.  This is symmetrical.  This has coincided with his shortness of breath.  The history is provided by the patient. No language interpreter was used.       Home Medications Prior to Admission medications   Medication Sig Start Date End Date Taking? Authorizing Provider  bicalutamide (CASODEX) 50 MG tablet Take 1 tablet by mouth twice daily 04/12/20   McKenzie, Candee Furbish, MD  clopidogrel (PLAVIX) 75 MG tablet Take 1 tablet by mouth once daily 10/25/21   [name], MD  docusate sodium (COLACE) 100 MG capsule Take 1 capsule (100 mg total) by mouth daily. 03/28/21   Earlie Server, MD  ezetimibe (ZETIA) 10 MG tablet Take 1 tablet by mouth once daily 07/29/21   Ledora Bottcher, PA  ferrous sulfate 325 (65 FE) MG EC tablet Take 1 tablet (325 mg total) by mouth 2 (two) times daily with a meal. 03/28/21   Earlie Server, MD  furosemide (LASIX) 20 MG tablet May take 20 mg tablet as needed for increase shortness of breath or swelling 01/28/21   Duke, Tami Lin, PA  metoprolol succinate (TOPROL-XL) 50 MG 24 hr tablet Take 1 tablet (50 mg total) by mouth daily. Take with or immediately following a meal. 01/28/21   Duke, Tami Lin, PA  nitroGLYCERIN (NITROSTAT) 0.4 MG SL  tablet Place 1 tablet (0.4 mg total) under the tongue every 5 (five) minutes as needed for chest pain. Patient not taking: Reported on 11/08/2021 01/28/21   Ledora Bottcher, PA  ondansetron (ZOFRAN) 4 MG tablet Take 4 mg by mouth every 4 (four) hours as needed for nausea or vomiting. Patient not taking: Reported on 11/08/2021 10/16/19   [provider]  PROAIR HFA 108 (90 Base) MCG/ACT inhaler INHALE 2 PUFFS INTO THE LUNGS EVERY 6 HOURS AS NEEDED FOR WHEEZING OR  FOR SHORTNESS OF BREATH Patient not taking: Reported on 11/08/2021 07/29/21   Fenton Foy, NP  rosuvastatin (CRESTOR) 40 MG tablet Take 1 tablet (40 mg total) by mouth daily. 01/28/21   Duke, Tami Lin, PA  sacubitril-valsartan (ENTRESTO) 97-103 MG Take 1 tablet by mouth 2 (two) times daily. 10/01/20   Camnitz, Ocie Doyne, MD  spironolactone (ALDACTONE) 25 MG tablet Take 1 tablet (25 mg total) by mouth daily. 01/28/21   Duke, Tami Lin, PA  tamsulosin (FLOMAX) 0.4 MG CAPS capsule Take 1 capsule (0.4 mg total) by mouth daily after supper. 01/28/21   Duke, Tami Lin, PA  traZODone (DESYREL) 50 MG tablet Take 0.5-1 tablets (25-50 mg total) by mouth at bedtime as needed for sleep. 03/20/20   Azzie Glatter, FNP  venlafaxine XR (EFFEXOR XR) 37.5 MG 24 hr capsule Take 1 capsule (  37.5 mg total) by mouth daily with breakfast. 10/26/19   Stoioff, Ronda Fairly, MD      Allergies    Patient has no known allergies.    Review of Systems   Review of Systems  Constitutional:  Negative for diaphoresis and fever.  Respiratory:  Positive for shortness of breath. Negative for cough.   Cardiovascular:  Negative for chest pain and leg swelling.  Gastrointestinal:  Negative for abdominal pain, nausea and vomiting.  Neurological:  Positive for weakness. Negative for light-headedness.  All other systems reviewed and are negative.   Physical Exam Updated Vital Signs BP (!) 169/116 (BP Location: Right Arm)   Pulse 97   Temp 98.5 F  (36.9 C)   Resp (!) 28   Ht 6' (1.829 m)   Wt 83.9 kg   SpO2 100%   BMI 25.09 kg/m  Physical Exam Vitals and nursing note reviewed.  Constitutional:      General: He is not in acute distress.    Appearance: Normal appearance. He is not ill-appearing.  HENT:     Head: Normocephalic and atraumatic.     Nose: Nose normal.  Eyes:     General: No scleral icterus.    Extraocular Movements: Extraocular movements intact.     Conjunctiva/sclera: Conjunctivae normal.  Neck:     Comments: Chronic right neck mass Cardiovascular:     Rate and Rhythm: Normal rate and regular rhythm.     Pulses: Normal pulses.  Pulmonary:     Effort: Pulmonary effort is normal. No respiratory distress.     Breath sounds: Normal breath sounds. No wheezing.  Abdominal:     General: There is no distension.     Palpations: Abdomen is soft.     Tenderness: There is no abdominal tenderness. There is no right CVA tenderness, left CVA tenderness or guarding.  Musculoskeletal:        General: Normal range of motion.     Cervical back: Normal range of motion.     Right lower leg: No edema.     Left lower leg: No edema.  Skin:    General: Skin is warm and dry.  Neurological:     General: No focal deficit present.     Mental Status: He is alert. Mental status is at baseline.     ED Results / Procedures / Treatments   Labs (all labs ordered are listed, but only abnormal results are displayed) Labs Reviewed  CBC WITH DIFFERENTIAL/PLATELET - Abnormal; Notable for the following components:      Result Value   MCV 76.1 (*)    MCH 24.6 (*)    RDW 19.9 (*)    All other components within normal limits  BASIC METABOLIC PANEL - Abnormal; Notable for the following components:   CO2 21 (*)    Glucose, Bld 119 (*)    All other components within normal limits  SARS CORONAVIRUS 2 BY RT PCR  HEPATIC FUNCTION PANEL  BRAIN NATRIURETIC PEPTIDE  TROPONIN I (HIGH SENSITIVITY)    EKG EKG  Interpretation  Date/Time:  Wednesday November 20 2021 11:07:45 EDT Ventricular Rate:  96 PR Interval:  149 QRS Duration: 134 QT Interval:  374 QTC Calculation: 473 R Axis:   94 Text Interpretation: Sinus rhythm Biatrial enlargement Left bundle branch block Confirmed by Ronnald Nian, Adam ([id number]) on 11/20/2021 11:09:48 AM  Radiology No results found.  Procedures Procedures    Medications Ordered in ED Medications - No data to display  ED Course/  Medical Decision Making/ A&P Clinical Course as of 11/20/21 1534  Wed Nov 20, 2021  1319 Troponin work-up overall reassuring.  CBC without leukocytosis or anemia.  BMP without renal insufficiency or electrolyte derangements.  Initial troponin of 9.  BNP within normal limits.  Hepatic function panel without acute derangements.  COVID-negative.  Given he has history of malignancy and he is at an increased risk for PE will evaluate with CTA chest.  He did have a CT scan last week and at that time he was without shortness of breath. [AA]  1505 CTA chest without evidence of PE or other acute findings.  I did ambulate patient within the room.  He remained without desaturations. [AA]    Clinical Course User Index [AA] Evlyn Courier, PA-C                           Medical Decision Making Amount and/or Complexity of Data Reviewed Labs: ordered. Radiology: ordered.  Risk Prescription drug management.   Medical Decision Making / ED Course   This patient presents to the ED for concern of shortness of breath, this involves an extensive number of treatment options, and is a complaint that carries with it a high risk of complications and morbidity.  The differential diagnosis includes ACS, CHF, pneumonia, PE, COVID  MDM: 61 year old male presents today for evaluation of shortness of breath.  He has history of CAD, status post PCI, CHF with EF of about 35-40%, prostate cancer.  Reports compliance with all of his cardiac medications.  Takes Lasix 20 mg  daily.  Worsening progressive dyspnea on exertion.  Orthopnea and conversational dyspnea present on exam.  Unable to evaluate for JVD.  Will obtain blood work, chest x-ray, BNP, troponin, EKG.  Will reevaluate. Work-up as noted above.  Given he is without chest pain, troponin of 9, no significant ischemic changes doubt ACS.  No signs of volume overload and BNP within normal.  CT without evidence of PE.  No concerning cause of patient's shortness of breath identified.  I did ambulate patient and he did so without desaturation.  Patient is appropriate for discharge.  Discharged in stable condition.  He does not have a PCP.  We will provide him with [facility] health internal medicine clinic for him to establish care with.     Lab Tests: -I ordered, reviewed, and interpreted labs.   The pertinent results include:   Labs Reviewed  CBC WITH DIFFERENTIAL/PLATELET - Abnormal; Notable for the following components:      Result Value   MCV 76.1 (*)    MCH 24.6 (*)    RDW 19.9 (*)    All other components within normal limits  BASIC METABOLIC PANEL - Abnormal; Notable for the following components:   CO2 21 (*)    Glucose, Bld 119 (*)    All other components within normal limits  HEPATIC FUNCTION PANEL - Abnormal; Notable for the following components:   Total Protein 9.4 (*)    All other components within normal limits  SARS CORONAVIRUS 2 BY RT PCR  BRAIN NATRIURETIC PEPTIDE  TROPONIN I (HIGH SENSITIVITY)      EKG  EKG Interpretation  Date/Time:  Wednesday November 20 2021 11:07:45 EDT Ventricular Rate:  96 PR Interval:  149 QRS Duration: 134 QT Interval:  374 QTC Calculation: 473 R Axis:   94 Text Interpretation: Sinus rhythm Biatrial enlargement Left bundle branch block Confirmed by Ronnald Nian, Adam ([id number]) on 11/20/2021 11:09:48  AM         Imaging Studies ordered: I ordered imaging studies including chest x-ray, CTA chest I independently visualized and interpreted imaging. I agree with the  radiologist interpretation   Medicines ordered and prescription drug management: No orders of the defined types were placed in this encounter.   -I have reviewed the patients home medicines and have made adjustments as needed  Cardiac Monitoring: The patient was maintained on a cardiac monitor.  I personally viewed and interpreted the cardiac monitored which showed an underlying rhythm of: Normal sinus rhythm  Reevaluation: After the interventions noted above, I reevaluated the patient and found that they have :stayed the same  Co morbidities that complicate the patient evaluation  Past Medical History:  Diagnosis Date   Abnormal chest CT 02/2018   a. Ectatic 3 cm Ao arch - rec f/u outpt imaging w/ CTA or MRA. Ectatic atheromatous abd Ao @ risk for aneurysm - rec f/u u/s in 5 yrs. Marked prostatic enlargement w/ scattered small sclerotic foci in the thoracic lower lumbar spine, sacrum, left 12th rib, pelvis, and right femur.  Recommend elective outpatient whole-body bone scintigraphy and PSA.   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 02/2018 ACS/PCI: LM nl, LAD 85p (3.5x15 [place] DES), D1 45ost, RI 55ost, RCA nl, EF 25-35%.   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 02/2018 Echo: EF 35-40% (LV gram on cath was 25-30%), mod, diff HK. mid-apicalanteroseptal, ant, and apical sev HK. RVH.-->  No notable change on echo from June 2020 and August 2020.   Localized swelling, mass or lump of neck    Long term (current) use of anticoagulants 11/16/2018   Malnutrition of moderate degree 11/11/2018   Mass of right side of neck 07/07/2014   Myocardial infarction [facility])    Neck mass    Right Neck mass - US neck showed 6.3 cm complex, partially cystic, partially solid mass.  CT neck showed 2 cm mass from parotid gland on right, with 3x4x5cm  Possible metastatic lymphadenopathy.  we recommended following up with Dr. Redmond Baseman ENT for biopsy of this.      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  Discharged in stable condition.  Return precautions discussed.  He had mild dyspnea on exertion however no desaturation, remained without tachycardia, without increased work of breathing otherwise.  Final Clinical Impression(s) / ED Diagnoses Final diagnoses:  Shortness of breath    Rx / DC Orders ED Discharge Orders     None         Evlyn Courier, PA-C 11/20/21 [place], [place], DO 11/21/21 [phone]

## 2021-11-20 NOTE — ED Triage Notes (Signed)
Shortness of breath x 3 days, intermittent sharp headache, leg weakness.

## 2021-11-20 NOTE — ED Notes (Signed)
Patient transported to CT 

## 2021-12-05 ENCOUNTER — Ambulatory Visit (INDEPENDENT_AMBULATORY_CARE_PROVIDER_SITE_OTHER): Payer: Medicaid Other

## 2021-12-05 DIAGNOSIS — I255 Ischemic cardiomyopathy: Secondary | ICD-10-CM

## 2021-12-06 LAB — CUP PACEART REMOTE DEVICE CHECK
Battery Remaining Longevity: 61 mo
Battery Remaining Percentage: 75 %
Battery Voltage: 2.98 V
Brady Statistic AP VP Percent: 7.2 %
Brady Statistic AP VS Percent: 1 %
Brady Statistic AS VP Percent: 89 %
Brady Statistic AS VS Percent: 4 %
Brady Statistic RA Percent Paced: 7.2 %
Date Time Interrogation Session: 20230928020010
HighPow Impedance: 71 Ohm
Implantable Lead Implant Date: 20220330
Implantable Lead Implant Date: 20220330
Implantable Lead Implant Date: 20220330
Implantable Lead Location: 753858
Implantable Lead Location: 753859
Implantable Lead Location: 753860
Implantable Pulse Generator Implant Date: 20220330
Lead Channel Impedance Value: 360 Ohm
Lead Channel Impedance Value: 430 Ohm
Lead Channel Impedance Value: 800 Ohm
Lead Channel Pacing Threshold Amplitude: 0.75 V
Lead Channel Pacing Threshold Amplitude: 0.75 V
Lead Channel Pacing Threshold Amplitude: 1.75 V
Lead Channel Pacing Threshold Pulse Width: 0.5 ms
Lead Channel Pacing Threshold Pulse Width: 0.5 ms
Lead Channel Pacing Threshold Pulse Width: 0.5 ms
Lead Channel Sensing Intrinsic Amplitude: 1.5 mV
Lead Channel Sensing Intrinsic Amplitude: 12 mV
Lead Channel Setting Pacing Amplitude: 2 V
Lead Channel Setting Pacing Amplitude: 2.5 V
Lead Channel Setting Pacing Amplitude: 3 V
Lead Channel Setting Pacing Pulse Width: 0.5 ms
Lead Channel Setting Pacing Pulse Width: 0.5 ms
Lead Channel Setting Sensing Sensitivity: 0.5 mV
Pulse Gen Serial Number: 111039291

## 2021-12-11 NOTE — Progress Notes (Signed)
Remote ICD transmission.   

## 2021-12-16 ENCOUNTER — Other Ambulatory Visit: Payer: Self-pay

## 2021-12-16 DIAGNOSIS — C61 Malignant neoplasm of prostate: Secondary | ICD-10-CM

## 2021-12-17 ENCOUNTER — Inpatient Hospital Stay: Payer: Medicaid Other | Attending: Oncology

## 2021-12-17 DIAGNOSIS — C7951 Secondary malignant neoplasm of bone: Secondary | ICD-10-CM | POA: Diagnosis present

## 2021-12-17 DIAGNOSIS — Z5111 Encounter for antineoplastic chemotherapy: Secondary | ICD-10-CM | POA: Diagnosis present

## 2021-12-17 DIAGNOSIS — Z79899 Other long term (current) drug therapy: Secondary | ICD-10-CM | POA: Diagnosis not present

## 2021-12-17 DIAGNOSIS — Z8589 Personal history of malignant neoplasm of other organs and systems: Secondary | ICD-10-CM | POA: Insufficient documentation

## 2021-12-17 DIAGNOSIS — D509 Iron deficiency anemia, unspecified: Secondary | ICD-10-CM | POA: Diagnosis not present

## 2021-12-17 DIAGNOSIS — R221 Localized swelling, mass and lump, neck: Secondary | ICD-10-CM | POA: Insufficient documentation

## 2021-12-17 DIAGNOSIS — Z9181 History of falling: Secondary | ICD-10-CM | POA: Diagnosis not present

## 2021-12-17 DIAGNOSIS — R296 Repeated falls: Secondary | ICD-10-CM | POA: Insufficient documentation

## 2021-12-17 DIAGNOSIS — C61 Malignant neoplasm of prostate: Secondary | ICD-10-CM | POA: Diagnosis present

## 2021-12-17 DIAGNOSIS — Z923 Personal history of irradiation: Secondary | ICD-10-CM | POA: Diagnosis not present

## 2021-12-17 LAB — COMPREHENSIVE METABOLIC PANEL
ALT: 16 U/L (ref 0–44)
AST: 22 U/L (ref 15–41)
Albumin: 4.3 g/dL (ref 3.5–5.0)
Alkaline Phosphatase: 91 U/L (ref 38–126)
Anion gap: 9 (ref 5–15)
BUN: 10 mg/dL (ref 8–23)
CO2: 24 mmol/L (ref 22–32)
Calcium: 9.5 mg/dL (ref 8.9–10.3)
Chloride: 103 mmol/L (ref 98–111)
Creatinine, Ser: 0.7 mg/dL (ref 0.61–1.24)
GFR, Estimated: >60 mL/min (ref >60)
Glucose, Bld: 92 mg/dL (ref 70–99)
Potassium: 3.4 mmol/L — ABNORMAL LOW (ref 3.5–5.1)
Sodium: 136 mmol/L (ref 135–145)
Total Bilirubin: 1 mg/dL (ref 0.3–1.2)
Total Protein: 8.3 g/dL — ABNORMAL HIGH (ref 6.5–8.1)

## 2021-12-17 LAB — CBC WITH DIFFERENTIAL/PLATELET
Abs Immature Granulocytes: 0.01 10*3/uL (ref 0.00–0.07)
Basophils Absolute: 0 10*3/uL (ref 0.0–0.1)
Basophils Relative: 1 %
Eosinophils Absolute: 0 10*3/uL (ref 0.0–0.5)
Eosinophils Relative: 1 %
HCT: 38.5 % — ABNORMAL LOW (ref 39.0–52.0)
Hemoglobin: 12.8 g/dL — ABNORMAL LOW (ref 13.0–17.0)
Immature Granulocytes: 0 %
Lymphocytes Relative: 39 %
Lymphs Abs: 1.7 10*3/uL (ref 0.7–4.0)
MCH: 25 pg — ABNORMAL LOW (ref 26.0–34.0)
MCHC: 33.2 g/dL (ref 30.0–36.0)
MCV: 75.3 fL — ABNORMAL LOW (ref 80.0–100.0)
Monocytes Absolute: 0.5 10*3/uL (ref 0.1–1.0)
Monocytes Relative: 12 %
Neutro Abs: 2 10*3/uL (ref 1.7–7.7)
Neutrophils Relative %: 47 %
Platelets: 403 10*3/uL — ABNORMAL HIGH (ref 150–400)
RBC: 5.11 MIL/uL (ref 4.22–5.81)
RDW: 18.8 % — ABNORMAL HIGH (ref 11.5–15.5)
WBC: 4.3 10*3/uL (ref 4.0–10.5)
nRBC: 0 % (ref 0.0–0.2)

## 2021-12-17 LAB — PSA: Prostatic Specific Antigen: 3.27 ng/mL (ref 0.00–4.00)

## 2021-12-20 ENCOUNTER — Ambulatory Visit: Payer: Medicaid Other | Admitting: Oncology

## 2021-12-25 ENCOUNTER — Inpatient Hospital Stay (HOSPITAL_BASED_OUTPATIENT_CLINIC_OR_DEPARTMENT_OTHER): Payer: Medicaid Other | Admitting: Oncology

## 2021-12-25 ENCOUNTER — Inpatient Hospital Stay: Payer: Medicaid Other

## 2021-12-25 ENCOUNTER — Encounter: Payer: Self-pay | Admitting: Oncology

## 2021-12-25 VITALS — BP 151/95 | HR 88 | Temp 97.6°F | Resp 18 | Wt 168.9 lb

## 2021-12-25 DIAGNOSIS — C61 Malignant neoplasm of prostate: Secondary | ICD-10-CM

## 2021-12-25 DIAGNOSIS — D119 Benign neoplasm of major salivary gland, unspecified: Secondary | ICD-10-CM | POA: Diagnosis not present

## 2021-12-25 DIAGNOSIS — Z5111 Encounter for antineoplastic chemotherapy: Secondary | ICD-10-CM | POA: Diagnosis not present

## 2021-12-25 DIAGNOSIS — D508 Other iron deficiency anemias: Secondary | ICD-10-CM

## 2021-12-25 DIAGNOSIS — C801 Malignant (primary) neoplasm, unspecified: Secondary | ICD-10-CM

## 2021-12-25 MED ORDER — LEUPROLIDE ACETATE (6 MONTH) 45 MG ~~LOC~~ KIT
45.0000 mg | PACK | Freq: Once | SUBCUTANEOUS | Status: AC
  Administered 2021-12-25: 45 mg via SUBCUTANEOUS
  Filled 2021-12-25: qty 45

## 2021-12-25 NOTE — Progress Notes (Signed)
Hematology/Oncology Progress note [phone] [phone]      Patient Care Team: Pcp, No as PCP - General [name], MD as PCP - Cardiology (Cardiology) Constance Haw, MD as PCP - Electrophysiology (Cardiology) McKenzie, Candee Furbish, MD as Consulting Physician (Urology)  REFERRING PROVIDER: Azzie Glatter, FNP  CHIEF COMPLAINTS/REASON FOR VISIT:  Follow up for prostate cancer and germ cell tumor  HISTORY OF PRESENTING ILLNESS:   Daniel Weiss is a  61 y.o.  male with PMH listed below was seen in consultation at the request of  Azzie Glatter, FNP  for evaluation of presumed prostate cancer Patient recently switched his care to [facility] and was seen by Dr. [name]. He was previously followed up at [facility] urology I reviewed Dr. Dene Gentry note. Patient was diagnosed with presumed prostate cancer on 10/2018 with a PSA of 509, incidental findings of multiple sclerotic lesions on CT-chest abdomen pelvis during work-up for a CVA.  His previous urology care was with Dr. Alyson Ingles. 11/05/2018 bone scan showed solitary punctate focus of activity in the left first sacral segment corresponding to a 9 mm mixed lytic and sclerotic metastatic's on recent CT.  The remaining numerous sclerotic osseous metastasis identified on CT do not demonstrate abnormal activity and are therefore healed.  At that time No tissue diagnosis/biopsy was obtained. Patient was started with Lupron and Casodex Lupron was eventually to Eligard [facility], PSA was 13.13, testosterone level less than 10. Casodex was discontinued in June 2021.   10/26/2019 patient switched to [facility] urology and was seen by Dr. [name] Per urology note, PSA trend: 11/03/2018: 502 11/22/2018: 377 02/21/2019: 387 04/25/2019: 123 09/05/2019: 13.3  Patient gets androgen deprivation therapy through Dr. Dene Gentry office.  Eligard '45mg'$   on  11/11/2019 -Patient was referred to heme-onc on 11/01/2019.  10/06/2019 bone scan showed resolution of punctated activity over the left sacrum.  The punctate area of increased activity over the right lower lumbar spine again noted. 10/31/2019, CT angio chest aorta showed no evidence of aortic aneurysm or dissection.  Atherosclerotic calcification spleen seen in the upper abdominal aorta.  Chronic artery disease.  No acute cardiopulmonary disease. Patient has a chronic right neck mass.  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   11/18/2019 CT abdomen pelvis with contrast showed retrocaval lymphadenopathy in the right common and external iliac chains.  Stable to minimally progressed in the interval since last CT scan 11/03/2018 Numerous sclerotic bone lesions.  Stable 3 mm subpleural right middle lobe lung nodule stable.  Aortic atherosclerosis   Patient has extensive cardiology issues.  He follows up with Dr. Ellyn Hack. Patient has history of STEMI-PCI to LAD with resultant ischemia cardiomyopathy, EF 35 to 40%, left bundle branch block with acute CVA-10/2018.  CT coronary showed nonobstructing plaquing of coronary distributions, intimal irregularity with filling defect in the ascending aorta but no LV thrombus.  And the patient is on warfarin and Plavix.  Patient was seen by thoracic surgeon Dr. Roxy Manns will recommend patient to be on Coumadin for minimum of 3 months.  With presentation with stroke, Dr. Ellyn Hack recommend at least 6 to 12 months of Coumadin. -Coumadin was discontinued by Dr. Ellyn Hack in September 2021 and switched to.  Plavix Patient lives with his sister.  He has 3 adult children.  His activity is quite limited due  to shortness of breath with exertion due to cardiology problems.  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 Tumor  markers Showed normal LDH, beta hCG, AFP # 01/17/2020 scrotum ultrasound showed a small right testicular mass measuring 1.6 cm, contained an internal calcification.  Appearance was consistent with primary testicular neoplasm.  Normal size and appearance of the left testicle.  #Prostate cancer, metastatic M1 A.  On androgen deprivation therapy.  Eligard through Dr. Dene Gentry office. 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  #01/17/2020 right orchiectomy residual germ cell tumor and germ cell neoplasia in situ are not identified.  Case was discussed on tumor board.  Consensus reached a point although residual germ cell tumor/infection was not identified, the presence of scar with extensive tubular atrophy is compatible with a completely regressed germ cell tumor.  He went to [facility] for second opinion as I recommended. Due to long waiting time, he left without being seen. Dr.Rush from [facility] called and agrees with patient current treatment.  11/11/2019 Eligard '45mg'$   on  Dr. Dene Gentry office missed his appointment with urologist on 05/11/2019 for Eligard treatments.  03/14/20 PET was reviewed and discussed with patient. - small hypermetabolic lymph nodes along the RIGHT iliac vessels is most consistent with nodal metastasis. 04/12/2020- 05/08/2020  radiation to periaortic lymph nodes as well as right hemipelvis node. 06/08/2020 Eligard '45mg'$  at cancer center  # Parotid mass, radiographically stable, never officially evaluated.  Patient has an ENT evaluation.  Status post biopsy- WARTHIN TUMOR.  Negative for atypia and malignancy.  # He has had CRT-D implant.  # 12/19/2020 CT chest abdomen pelvis showed interval resolution of retrocaval and right iliac lymphadenotomy. No new or progressive disease. Stable  sclerotic lesions. Non obstructing bilateral nephrolithiasis.  #06/14/2021, CT neck soft tissue with contrast showed multiple bilateral parotid masses, largest lesion on the right has increased the size, now 5.1 x 2.9 x 4.9 cm.  Previously 4.1 cm in 2016.  INTERVAL HISTORY Daniel Weiss is a 61 y.o. male who has above history reviewed by me today presents for follow up visit for management of metastatic prostate cancer and germ cell tumor.  He reports feeling little dizzy today. + hot flash Bilateral lower extremity pain after exertion.  Review of Systems  Constitutional:  Positive for fatigue. Negative for appetite change, chills, fever and unexpected weight change.  HENT:   Negative for hearing loss and voice change.        Chronic neck mass  Eyes:  Negative for eye problems and icterus.  Respiratory:  Positive for shortness of breath. Negative for chest tightness and cough.   Cardiovascular:  Negative for chest pain and leg swelling.  Gastrointestinal:  Negative for abdominal distention and abdominal pain.  Endocrine: Positive for hot flashes.  Genitourinary:  Negative for difficulty urinating, dysuria and frequency.   Musculoskeletal:  Negative for arthralgias.  Skin:  Negative for itching and rash.  Neurological:  Negative for dizziness, light-headedness and numbness.  Hematological:  Negative for adenopathy. Does not bruise/bleed easily.  Psychiatric/Behavioral:  Negative for confusion.     MEDICAL HISTORY:  Past Medical History:  Diagnosis Date   Abnormal chest CT 02/2018   a. Ectatic 3 cm Ao arch - rec f/u outpt imaging w/ CTA or MRA. Ectatic atheromatous abd  Ao @ risk for aneurysm - rec f/u u/s in 5 yrs. Marked prostatic enlargement w/ scattered small sclerotic foci in the thoracic lower lumbar spine, sacrum, left 12th rib, pelvis, and right femur.  Recommend elective outpatient whole-body bone scintigraphy and PSA.   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 02/2018 ACS/PCI: LM nl, LAD 85p (3.5x15 Sierra DES), D1 45ost, RI 55ost, RCA nl, EF 25-35%.   Cardiac murmur    Chest pain on exertion 11/28/2019   Chronic combined systolic and diastolic CHF, NYHA class 2 ([place]) 04/17/2020   Complete left bundle branch block (LBBB) 2016   Coronary artery disease involving native coronary artery of native heart with angina pectoris ([place]) 03/10/2018   Dyspnea    Encephalopathy, hypertensive    Essential hypertension 07/07/2014   Germ cell tumor ([place]) 12/22/2019   Goals of care, counseling/discussion 12/22/2019   Hematemesis without nausea    HFrEF (heart failure with reduced ejection fraction) ([place])    History of CVA (cerebrovascular accident) 11/23/2018   History of non-ST elevation myocardial infarction (NSTEMI) 02/21/2019   Hypertension    Hypokalemia    Hyponatremia 06/29/2015   Ischemic cardiomyopathy 02/2018   a. 02/2018 Echo: EF 35-40% (LV gram on cath was 25-30%), mod, diff HK. mid-apicalanteroseptal, ant, and apical sev HK. RVH.-->  No notable change on echo from June 2020 and August 2020.   Localized swelling, mass or lump of neck    Long term (current) use of anticoagulants 11/16/2018   Malnutrition of moderate degree 11/11/2018   Mass of right side of neck 07/07/2014   Myocardial infarction [facility])    Neck mass    Right Neck mass - US neck showed 6.3 cm complex, partially cystic, partially solid mass.  CT neck showed 2 cm mass from parotid gland on right, with 3x4x5cm  Possible metastatic lymphadenopathy.  we recommended following up with Dr. Redmond Baseman ENT for biopsy of this.      Non-ST elevation (NSTEMI) myocardial infarction ([place]) 03/06/2018   NSTEMI (non-ST elevated myocardial infarction) ([place]) 02/2018   85% pLAD - DES PCI     Paresthesia 07/07/2014   Prostate cancer ([place]) 03/10/2018   Prostate cancer metastatic to multiple sites [facility]) 11/2018   Right sided weakness 07/07/2014   Stroke [facility])    TIA (transient ischemic attack) 2016   Warthin's tumor     SURGICAL HISTORY: Past Surgical History:  Procedure Laterality Date   BIV ICD INSERTION CRT-D N/A 06/06/2020   Procedure: BIV ICD INSERTION CRT-D;  Surgeon: Constance Haw, MD;  Location: [place] CV LAB;  Service: Cardiovascular;  Laterality: N/A;   COLONOSCOPY WITH PROPOFOL N/A 02/27/2020   Procedure: COLONOSCOPY WITH PROPOFOL;  Surgeon: Yetta Flock, MD;  Location: [place];  Service: Gastroenterology;  Laterality: N/A;   CORONARY STENT INTERVENTION N/A 03/06/2018   Procedure: CORONARY STENT INTERVENTION;  Surgeon: [name], MD;  Location: [place] CV LAB;  Service: Cardiovascular: Proximal LAD 85% stenosis (and D1): DES PCI with Xience Sierra DES 3.5 mm x 15 mm - 3.9 mm   ESOPHAGOGASTRODUODENOSCOPY (EGD) WITH PROPOFOL N/A 02/27/2020   Procedure: ESOPHAGOGASTRODUODENOSCOPY (EGD) WITH PROPOFOL;  Surgeon: Yetta Flock, MD;  Location: [facility]  ENDOSCOPY;  Service: Gastroenterology;  Laterality: N/A;   HEMOSTASIS CLIP PLACEMENT  02/27/2020   Procedure: HEMOSTASIS CLIP PLACEMENT;  Surgeon: Yetta Flock, MD;  Location: [place] ENDOSCOPY;  Service: Gastroenterology;;   HOT HEMOSTASIS N/A 02/27/2020   Procedure: HOT HEMOSTASIS (ARGON PLASMA COAGULATION/BICAP);  Surgeon: Yetta Flock, MD;  Location: [facility] ENDOSCOPY;  Service: Gastroenterology;  Laterality: N/A;   INTRAOPERATIVE TRANSTHORACIC ECHOCARDIOGRAM  03/06/2018   (Peri-MI) mild reduced EF 35-40%.  Diffuse hypokinesis (severe HK of the mid-apical anteroseptal, anterior and apical myocardium consistent with LAD infarct).  Unable to assess diastolic function.  Paradoxical septal motion likely related to his LBBB.  RV hypertrophy noted.  Trivial pericardial effusion noted.     LEFT HEART CATH AND CORONARY ANGIOGRAPHY N/A 03/06/2018   Procedure: LEFT HEART CATH AND CORONARY ANGIOGRAPHY;  Surgeon: [name], MD;  Location: [place] CV LAB;  Service: Cardiovascular: Proximal LAD 85% involving D1 45%.  Ostial RI 55%.  Severe LV dysfunction EF 25 to 30%.  1+ MR.  Only minimally elevated LVEDP.   ORCHIECTOMY Right 01/17/2020   Procedure: ORCHIECTOMY;  Surgeon: Abbie Sons, MD;  Location: [facility] ORS;  Service: Urology;  Laterality: Right;   POLYPECTOMY  02/27/2020   Procedure: POLYPECTOMY;  Surgeon: Yetta Flock, MD;  Location: [facility] ENDOSCOPY;  Service: Gastroenterology;;   PROSTATE BIOPSY N/A 01/17/2020   Procedure: BIOPSY TRANSRECTAL ULTRASONIC PROSTATE (TUBP);  Surgeon: Abbie Sons, MD;  Location: [facility] ORS;  Service: Urology;  Laterality: N/A;   TRANSTHORACIC ECHOCARDIOGRAM  10/2018   (Most recent echo, to evaluate for TIA/CVA) : Moderate reduced EF 35 to 40%.  Moderate concentric LVH.  Mild dyskinesis of the apex and severe hypokinesis of mid apical anteroseptal and anterior wall.  Unable to assess RV pressures.  Aortic sclerosis with no stenosis.  No significant change seen    SOCIAL HISTORY: Social History   Socioeconomic History   Marital status: Legally Separated    Spouse name: Dorothenia   Number of children: Not on file   Years of education: Not on file   Highest education level: Not on file  Occupational History   Occupation: cook  Tobacco Use   Smoking status: Some Days    Packs/day: 0.00    Years: 0.00    Total pack years: 0.00    Types: Cigarettes   Smokeless tobacco: Never   Tobacco comments:    quit three days ago  Vaping Use   Vaping Use: Never used  Substance and Sexual Activity   Alcohol use: Yes    Alcohol/week: 2.0 standard drinks of alcohol    Types: 2 Cans of beer per week    Comment: 2 Beers daily.   Drug use: Yes    Frequency: 3.0 times per week    Types: Marijuana   Sexual activity: Not Currently  Other  Topics Concern   Not on file  Social History Narrative   Currently not working.  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  Now has cancer.   Social Determinants of Health   Financial Resource Strain: Not on file  Food Insecurity: Not on file  Transportation Needs: No Transportation Needs (11/22/2018)   PRAPARE - Hydrologist (Medical): No    Lack of Transportation (Non-Medical): No  Physical Activity: Not on file  Stress: Not on file  Social Connections: Not on file  Intimate Partner Violence: Not on file  FAMILY HISTORY: Family History  Problem Relation Age of Onset   Stroke Mother    Hypertension Mother    Diabetes type II Mother    Leukemia Other    Breast cancer Other    Stroke Brother    CAD Neg Hx     ALLERGIES:  has No Known Allergies.  MEDICATIONS:  Current Outpatient Medications  Medication Sig Dispense Refill   bicalutamide (CASODEX) 50 MG tablet Take 1 tablet by mouth twice daily 60 tablet 0   clopidogrel (PLAVIX) 75 MG tablet Take 1 tablet by mouth once daily 90 tablet 3   docusate sodium (COLACE) 100 MG capsule Take 1 capsule (100 mg total) by mouth daily. 30 capsule 3   ezetimibe (ZETIA) 10 MG tablet Take 1 tablet by mouth once daily 90 tablet 0   ferrous sulfate 325 (65 FE) MG EC tablet Take 1 tablet (325 mg total) by mouth 2 (two) times daily with a meal. 60 tablet 3   furosemide (LASIX) 20 MG tablet May take 20 mg tablet as needed for increase shortness of breath or swelling 20 tablet 6   metoprolol succinate (TOPROL-XL) 50 MG 24 hr tablet Take 1 tablet (50 mg total) by mouth daily. Take with or immediately following a meal. 90 tablet 3   PROAIR HFA 108 (90 Base) MCG/ACT inhaler INHALE 2 PUFFS INTO THE LUNGS EVERY 6 HOURS AS NEEDED FOR WHEEZING OR  FOR SHORTNESS OF BREATH 9 g 0   rosuvastatin (CRESTOR) 40 MG tablet Take 1 tablet (40 mg total) by mouth daily. 90 tablet 3   sacubitril-valsartan  (ENTRESTO) 97-103 MG Take 1 tablet by mouth 2 (two) times daily. 180 tablet 2   spironolactone (ALDACTONE) 25 MG tablet Take 1 tablet (25 mg total) by mouth daily. 90 tablet 3   tamsulosin (FLOMAX) 0.4 MG CAPS capsule Take 1 capsule (0.4 mg total) by mouth daily after supper. 90 capsule 3   traZODone (DESYREL) 50 MG tablet Take 0.5-1 tablets (25-50 mg total) by mouth at bedtime as needed for sleep. 30 tablet 6   venlafaxine XR (EFFEXOR XR) 37.5 MG 24 hr capsule Take 1 capsule (37.5 mg total) by mouth daily with breakfast. 30 capsule 1   nitroGLYCERIN (NITROSTAT) 0.4 MG SL tablet Place 1 tablet (0.4 mg total) under the tongue every 5 (five) minutes as needed for chest pain. (Patient not taking: Reported on 11/08/2021) 25 tablet 3   ondansetron (ZOFRAN) 4 MG tablet Take 4 mg by mouth every 4 (four) hours as needed for nausea or vomiting. (Patient not taking: Reported on 11/08/2021)     No current facility-administered medications for this visit.   Facility-Administered Medications Ordered in Other Visits  Medication Dose Route Frequency Provider Last Rate Last Admin   leuprolide (6 Month) (ELIGARD) injection 45 mg  45 mg Subcutaneous Once Earlie Server, MD         PHYSICAL EXAMINATION: ECOG PERFORMANCE STATUS: 1 - Symptomatic but completely ambulatory Vitals:   12/25/21 1330  BP: (!) 151/95  Pulse: 88  Resp: 18  Temp: 97.6 F (36.4 C)  SpO2: 100%   Filed Weights   12/25/21 1330  Weight: 168 lb 14.4 oz (76.6 kg)    Physical Exam Constitutional:      General: He is not in acute distress.    Comments: Thin built.   HENT:     Head: Normocephalic and atraumatic.  Eyes:     General: No scleral icterus. Neck:     Comments: Palpable  right cervical mass Cardiovascular:     Rate and Rhythm: Normal rate and regular rhythm.  Pulmonary:     Effort: Pulmonary effort is normal. No respiratory distress.     Breath sounds: No wheezing.  Abdominal:     General: Bowel sounds are normal. There is no  distension.     Palpations: Abdomen is soft.  Musculoskeletal:        General: No deformity. Normal range of motion.     Cervical back: Normal range of motion and neck supple.  Skin:    General: Skin is warm and dry.     Findings: No erythema or rash.  Neurological:     Mental Status: He is alert and oriented to person, place, and time. Mental status is at baseline.     Cranial Nerves: No cranial nerve deficit.     Coordination: Coordination normal.  Psychiatric:        Mood and Affect: Mood normal.     LABORATORY DATA:  I have reviewed the data as listed    Latest Ref Rng & Units 12/17/2021   11:16 AM 11/20/2021   11:08 AM 11/04/2021    9:02 AM  CBC  WBC 4.0 - 10.5 K/uL 4.3  5.7  6.4   Hemoglobin 13.0 - 17.0 g/dL 12.8  13.4  11.0   Hematocrit 39.0 - 52.0 % 38.5  41.4  34.6   Platelets 150 - 400 K/uL 403  376  342       Latest Ref Rng & Units 12/17/2021   11:16 AM 11/20/2021   11:08 AM 11/04/2021    9:02 AM  CMP  Glucose 70 - 99 mg/dL 92  119  104   BUN 8 - 23 mg/dL '10  12  17   '$ Creatinine 0.61 - 1.24 mg/dL 0.70  1.08  0.76   Sodium 135 - 145 mmol/L 136  137  136   Potassium 3.5 - 5.1 mmol/L 3.4  3.5  4.1   Chloride 98 - 111 mmol/L 103  102  104   CO2 22 - 32 mmol/L '24  21  24   '$ Calcium 8.9 - 10.3 mg/dL 9.5  10.1  9.2   Total Protein 6.5 - 8.1 g/dL 8.3  9.4  7.7   Total Bilirubin 0.3 - 1.2 mg/dL 1.0  0.9  0.3   Alkaline Phos 38 - 126 U/L 91  103  85   AST 15 - 41 U/L '22  31  20   '$ ALT 0 - 44 U/L '16  14  12     '$ Iron/TIBC/Ferritin/ %Sat    Component Value Date/Time   IRON 51 03/25/2021 0938   TIBC 519 (H) 03/25/2021 0938   FERRITIN 5 (L) 03/25/2021 0938   IRONPCTSAT 10 (L) 03/25/2021 4944      RADIOGRAPHIC STUDIES: I have personally reviewed the radiological images as listed and agreed with the findings in the report. CUP PACEART REMOTE DEVICE CHECK  Result Date: 12/06/2021 Scheduled remote reviewed. Normal device function.  2 NSVT, 4 SVT, all EGM's show  regular 1:1, longest duration 83mn 6sec, HR's 171 3 AMS, 6-8sec in duration Next remote 91 days. LA     ASSESSMENT & PLAN:  1. [place]state cancer ([place]   2. Germ cell tumor ([place]   3. Other iron deficiency anemia   4. 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  PSA nadir was on 03/25/2021.   06/24/2021, PSA 1.80. 11/08/2021, PSA 2.9 12/17/2021, PSA 3.27. CT scan showed no major progression.  Unchanged multiple small sclerotic lesions in the thoracic and lumbar spine as well as pelvis.  Stable right middle lobe nodule. Discussed with the patient that PSA is gradually increasing.  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  Given his multiple comorbidities, and relatively stable CT scan, continue indigent prevention therapy. Consider adding darolutamide when he has significant  disease progression Proceed with Eligard 45 mg today.   #Germ cell tumor, stage II, status post radiation. Stable tumor markers.  No recurrence.  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  Not hypermetabolic on PET scan. I will obtain bone scan in 3 months.  #Frequent falls, secondary to lightheadedness triggered by turning his head to right.  Possible secondary to large right neck mass.Symptoms are stable. Not surgery candidate due to multiple medical problems.    # Iron deficiency anemia.  Hemoglobin has significantly improved to 12.4.  Continue oral iron supplementation.  All questions were answered. The patient knows to call the clinic with any problems questions or concerns.  Return of visit: 3 months  Earlie Server, MD, PhD 12/25/2021

## 2021-12-25 NOTE — Progress Notes (Signed)
Pt here for follow up. Reports that he has been feeling dizzy and that his legs have been weak

## 2022-01-15 ENCOUNTER — Ambulatory Visit (HOSPITAL_COMMUNITY): Payer: Medicaid Other | Attending: Physician Assistant

## 2022-01-15 DIAGNOSIS — I255 Ischemic cardiomyopathy: Secondary | ICD-10-CM

## 2022-01-15 DIAGNOSIS — I5042 Chronic combined systolic (congestive) and diastolic (congestive) heart failure: Secondary | ICD-10-CM | POA: Diagnosis not present

## 2022-01-15 LAB — ECHOCARDIOGRAM COMPLETE
Area-P 1/2: 3.43 cm2
P 1/2 time: 798 msec
S' Lateral: 3.8 cm

## 2022-01-15 MED ORDER — PERFLUTREN LIPID MICROSPHERE
1.0000 mL | INTRAVENOUS | Status: AC | PRN
  Administered 2022-01-15: 2 mL via INTRAVENOUS

## 2022-02-06 ENCOUNTER — Telehealth: Payer: Self-pay

## 2022-02-06 ENCOUNTER — Other Ambulatory Visit: Payer: Self-pay | Admitting: Nurse Practitioner

## 2022-02-06 NOTE — Telephone Encounter (Signed)
CV Remote Solution Alert  Nonbillable remote reviewed. Normal device function.  There ewere 3 short NSVT arrhythmias detected.  There 12 SVT arrhythmias detected that were 171 bpm.  Presenting is fast at 133 bpm, sent to triage Next remote 03/06/2022.  Patient reports he accidentally sent the transmission thinking it was scheduled. Reports he felt well and has been feeling well. Reports he was sleeping during time of tachycardia yesterday. Reports he will send a transmission now and I advised patient I will only call back if any concerns are noted. Patient voiced understanding and agreeable to plan.    Remote transmission reviewed. Shows presenting rhythm is NSR. Continue to monitor.

## 2022-02-27 ENCOUNTER — Encounter: Payer: Self-pay | Admitting: Oncology

## 2022-03-06 ENCOUNTER — Ambulatory Visit (INDEPENDENT_AMBULATORY_CARE_PROVIDER_SITE_OTHER): Payer: Medicare Other

## 2022-03-06 DIAGNOSIS — I255 Ischemic cardiomyopathy: Secondary | ICD-10-CM | POA: Diagnosis not present

## 2022-03-06 LAB — CUP PACEART REMOTE DEVICE CHECK
Battery Remaining Longevity: 59 mo
Battery Remaining Percentage: 72 %
Battery Voltage: 2.96 V
Brady Statistic AP VP Percent: 6.5 %
Brady Statistic AP VS Percent: 1 %
Brady Statistic AS VP Percent: 89 %
Brady Statistic AS VS Percent: 4.3 %
Brady Statistic RA Percent Paced: 6.5 %
Date Time Interrogation Session: 20231228010104
HighPow Impedance: 77 Ohm
Implantable Lead Connection Status: 753985
Implantable Lead Connection Status: 753985
Implantable Lead Connection Status: 753985
Implantable Lead Implant Date: 20220330
Implantable Lead Implant Date: 20220330
Implantable Lead Implant Date: 20220330
Implantable Lead Location: 753858
Implantable Lead Location: 753859
Implantable Lead Location: 753860
Implantable Pulse Generator Implant Date: 20220330
Lead Channel Impedance Value: 360 Ohm
Lead Channel Impedance Value: 460 Ohm
Lead Channel Impedance Value: 800 Ohm
Lead Channel Pacing Threshold Amplitude: 0.75 V
Lead Channel Pacing Threshold Amplitude: 0.75 V
Lead Channel Pacing Threshold Amplitude: 1.75 V
Lead Channel Pacing Threshold Pulse Width: 0.5 ms
Lead Channel Pacing Threshold Pulse Width: 0.5 ms
Lead Channel Pacing Threshold Pulse Width: 0.5 ms
Lead Channel Sensing Intrinsic Amplitude: 12 mV
Lead Channel Sensing Intrinsic Amplitude: 2.5 mV
Lead Channel Setting Pacing Amplitude: 2 V
Lead Channel Setting Pacing Amplitude: 2.5 V
Lead Channel Setting Pacing Amplitude: 3 V
Lead Channel Setting Pacing Pulse Width: 0.5 ms
Lead Channel Setting Pacing Pulse Width: 0.5 ms
Lead Channel Setting Sensing Sensitivity: 0.5 mV
Pulse Gen Serial Number: 111039291
Zone Setting Status: 755011

## 2022-03-19 ENCOUNTER — Ambulatory Visit: Payer: Self-pay | Admitting: Nurse Practitioner

## 2022-03-24 NOTE — Progress Notes (Signed)
Remote ICD transmission.   

## 2022-03-27 ENCOUNTER — Other Ambulatory Visit: Payer: Medicaid Other

## 2022-03-31 ENCOUNTER — Other Ambulatory Visit: Payer: Medicaid Other

## 2022-04-01 ENCOUNTER — Ambulatory Visit: Payer: Medicaid Other | Admitting: Oncology

## 2022-04-02 ENCOUNTER — Ambulatory Visit: Payer: Self-pay | Admitting: Nurse Practitioner

## 2022-04-14 ENCOUNTER — Encounter: Admission: RE | Admit: 2022-04-14 | Payer: Medicare Other | Source: Ambulatory Visit

## 2022-04-14 ENCOUNTER — Encounter: Payer: Medicare Other | Attending: Oncology

## 2022-04-23 ENCOUNTER — Ambulatory Visit: Payer: Self-pay | Admitting: Nurse Practitioner

## 2022-05-05 ENCOUNTER — Other Ambulatory Visit: Payer: Self-pay

## 2022-05-05 DIAGNOSIS — C61 Malignant neoplasm of prostate: Secondary | ICD-10-CM

## 2022-05-06 ENCOUNTER — Encounter: Payer: Self-pay | Admitting: Oncology

## 2022-05-06 ENCOUNTER — Inpatient Hospital Stay: Payer: Medicare Other | Attending: Oncology

## 2022-05-06 DIAGNOSIS — C61 Malignant neoplasm of prostate: Secondary | ICD-10-CM | POA: Insufficient documentation

## 2022-05-06 DIAGNOSIS — R296 Repeated falls: Secondary | ICD-10-CM | POA: Insufficient documentation

## 2022-05-06 DIAGNOSIS — Z79899 Other long term (current) drug therapy: Secondary | ICD-10-CM | POA: Diagnosis not present

## 2022-05-06 DIAGNOSIS — Z923 Personal history of irradiation: Secondary | ICD-10-CM | POA: Diagnosis not present

## 2022-05-06 DIAGNOSIS — C7951 Secondary malignant neoplasm of bone: Secondary | ICD-10-CM | POA: Diagnosis not present

## 2022-05-06 DIAGNOSIS — D509 Iron deficiency anemia, unspecified: Secondary | ICD-10-CM | POA: Diagnosis not present

## 2022-05-06 LAB — CBC WITH DIFFERENTIAL/PLATELET
Abs Immature Granulocytes: 0.01 10*3/uL (ref 0.00–0.07)
Basophils Absolute: 0 10*3/uL (ref 0.0–0.1)
Basophils Relative: 1 %
Eosinophils Absolute: 0 10*3/uL (ref 0.0–0.5)
Eosinophils Relative: 1 %
HCT: 37.8 % — ABNORMAL LOW (ref 39.0–52.0)
Hemoglobin: 11.9 g/dL — ABNORMAL LOW (ref 13.0–17.0)
Immature Granulocytes: 0 %
Lymphocytes Relative: 36 %
Lymphs Abs: 1.5 10*3/uL (ref 0.7–4.0)
MCH: 25.4 pg — ABNORMAL LOW (ref 26.0–34.0)
MCHC: 31.5 g/dL (ref 30.0–36.0)
MCV: 80.8 fL (ref 80.0–100.0)
Monocytes Absolute: 0.5 10*3/uL (ref 0.1–1.0)
Monocytes Relative: 12 %
Neutro Abs: 2.1 10*3/uL (ref 1.7–7.7)
Neutrophils Relative %: 50 %
Platelets: 405 10*3/uL — ABNORMAL HIGH (ref 150–400)
RBC: 4.68 MIL/uL (ref 4.22–5.81)
RDW: 19.6 % — ABNORMAL HIGH (ref 11.5–15.5)
WBC: 4.2 10*3/uL (ref 4.0–10.5)
nRBC: 0 % (ref 0.0–0.2)

## 2022-05-06 LAB — COMPREHENSIVE METABOLIC PANEL
ALT: 12 U/L (ref 0–44)
AST: 22 U/L (ref 15–41)
Albumin: 4.4 g/dL (ref 3.5–5.0)
Alkaline Phosphatase: 68 U/L (ref 38–126)
Anion gap: 8 (ref 5–15)
BUN: 13 mg/dL (ref 8–23)
CO2: 25 mmol/L (ref 22–32)
Calcium: 9.8 mg/dL (ref 8.9–10.3)
Chloride: 105 mmol/L (ref 98–111)
Creatinine, Ser: 0.69 mg/dL (ref 0.61–1.24)
GFR, Estimated: >60 mL/min (ref >60)
Glucose, Bld: 113 mg/dL — ABNORMAL HIGH (ref 70–99)
Potassium: 3.5 mmol/L (ref 3.5–5.1)
Sodium: 138 mmol/L (ref 135–145)
Total Bilirubin: 0.6 mg/dL (ref 0.3–1.2)
Total Protein: 8.5 g/dL — ABNORMAL HIGH (ref 6.5–8.1)

## 2022-05-06 LAB — PSA: Prostatic Specific Antigen: 5.75 ng/mL — ABNORMAL HIGH (ref 0.00–4.00)

## 2022-05-07 ENCOUNTER — Inpatient Hospital Stay (HOSPITAL_BASED_OUTPATIENT_CLINIC_OR_DEPARTMENT_OTHER): Payer: Medicare Other | Admitting: Oncology

## 2022-05-07 ENCOUNTER — Encounter: Payer: Self-pay | Admitting: Oncology

## 2022-05-07 VITALS — BP 131/87 | HR 72 | Temp 96.0°F | Resp 18 | Wt 173.5 lb

## 2022-05-07 DIAGNOSIS — C7951 Secondary malignant neoplasm of bone: Secondary | ICD-10-CM | POA: Diagnosis not present

## 2022-05-07 DIAGNOSIS — R296 Repeated falls: Secondary | ICD-10-CM

## 2022-05-07 DIAGNOSIS — C61 Malignant neoplasm of prostate: Secondary | ICD-10-CM

## 2022-05-07 DIAGNOSIS — C801 Malignant (primary) neoplasm, unspecified: Secondary | ICD-10-CM

## 2022-05-07 DIAGNOSIS — D509 Iron deficiency anemia, unspecified: Secondary | ICD-10-CM | POA: Insufficient documentation

## 2022-05-07 DIAGNOSIS — D508 Other iron deficiency anemias: Secondary | ICD-10-CM

## 2022-05-07 NOTE — Assessment & Plan Note (Addendum)
Repeat PMSA

## 2022-05-07 NOTE — Assessment & Plan Note (Addendum)
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   06/24/2021, PSA 1.80. 11/08/2021, PSA 2.9 12/17/2021, PSA 3.27. 05/06/2021, PSA increased to 5.75.  Indicating castration resistant disease. I recommend to repeat PMSA.  Discussed about possibility of adding Xtandi, Rationale and potential side effects were reviewed and discussed with patient.

## 2022-05-07 NOTE — Assessment & Plan Note (Signed)
Hemoglobin has significantly improved to 12.4. Continue oral iron supplementation.

## 2022-05-07 NOTE — Progress Notes (Signed)
Patient for follow up. No new hematology/ onc concerns

## 2022-05-07 NOTE — Assessment & Plan Note (Signed)
#  Frequent falls, secondary to lightheadedness triggered by turning his head to right. Possible secondary to large right neck mass.Symptoms are stable. Not surgery candidate due to multiple medical problems.

## 2022-05-07 NOTE — Assessment & Plan Note (Addendum)
#  Germ cell tumor, stage II, status post radiation. Stable tumor markers.  No recurrence.  Will repeat tumor markers next visit.

## 2022-05-07 NOTE — Progress Notes (Signed)
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   06/24/2021, PSA 1.80. 11/08/2021, PSA 2.9 12/17/2021, PSA 3.27. 05/06/2021, PSA increased to 5.75.  Indicating castration resistant disease. I recommend to repeat PMSA.  Discussed about possibility of adding Xtandi, Rationale and potential side effects were reviewed and discussed with patient.   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 Stable tumor markers.  No recurrence.  Will repeat tumor markers next visit.  Metastatic cancer to bone [facility]) Repeat PMSA  Repeated falls #Frequent falls, secondary to lightheadedness triggered by turning his head to right. Possible secondary to large right neck mass.Symptoms are stable. Not surgery candidate due to multiple medical problems.   IDA (iron deficiency anemia) Hemoglobin has significantly improved to 12.4. Continue oral iron supplementation.   Orders Placed This Encounter  Procedures   NM PET (PSMA) SKULL TO MID THIGH    Standing Status:   Future    Standing Expiration Date:   05/08/2023    Order Specific Question:   If indicated for the ordered procedure, I authorize the administration of a radiopharmaceutical per Radiology protocol    Answer:   Yes    Order Specific Question:   Preferred imaging location?    Answer:   [facility]    All questions were answered. The patient knows to call the clinic with any problems, questions or concerns.  Earlie Server, MD, PhD [facility]  Hematology Oncology 05/07/2022   HISTORY OF PRESENTING ILLNESS:  Oncology History  Prostate cancer [facility])  03/10/2018 Initial Diagnosis   Prostate cancer [facility])  Patient was diagnosed with presumed prostate cancer on 10/2018 with a PSA of 509, incidental findings of multiple sclerotic lesions on CT-chest abdomen pelvis during work-up for a CVA.  His previous urology care was with Dr. Alyson Ingles. 11/05/2018 bone scan showed solitary punctate focus of activity in the left first sacral segment corresponding to a 9 mm mixed lytic and sclerotic metastatic's on recent CT.  The remaining numerous sclerotic osseous metastasis identified on CT do not demonstrate abnormal activity and are therefore healed.  At that time No tissue diagnosis/biopsy was obtained. Patient was started with Lupron and Casodex Lupron was eventually to Eligard [facility]2019, PSA was 13.13, testosterone level less than 10. Casodex was discontinued in June 2021 10/26/2019 patient switched to [facility] urology and was seen by Dr. [name] Per urology note, PSA trend: 11/03/2018: 502 11/22/2018: 377 02/21/2019: 387 04/25/2019: 123 09/05/2019: 13.3 Patient was continued on Eligard '45mg'$  Q6 months.    10/06/2019 Imaging   bone scan showed resolution of punctated activity over the left sacrum. The punctate area of increased activity over the right lower lumbar spine again noted.    10/31/2019 Imaging   CT angio chest aorta showed no evidence of aortic aneurysm or dissection.  Atherosclerotic calcification spleen seen in the upper abdominal aorta.  Chronic artery disease.  No acute cardiopulmonary disease. Patient has a chronic right neck mass.  He has known history of large right parotid and parotid region  mass which has been stable since 2016 on CT neck that was done in August 2020.    11/18/2019 Imaging   11/18/2019 CT abdomen pelvis with contrast showed retrocaval lymphadenopathy in the right  common and external iliac chains.  Stable to minimally progressed in the interval since last CT scan 11/03/2018 Numerous sclerotic bone lesions.  Stable 3 mm subpleural right middle lobe lung nodule stable.  Aortic atherosclerosis   11/28/2019 Procedure   Paracaval lymph node biopsy showed metastatic neoplasm,consistent with metastatic germ cell tumor, morphologically compatible with seminoma, and his case was reviewed on tumor board.  possibly seminoma. likely stage II pTx [id number] Tumor markers Showed normal LDH, beta hCG, AFP   01/17/2020 Cancer Staging   Staging form: Prostate, AJCC 8th Edition - Clinical stage from 01/17/2020: Stage IVB (cT2c, cN0, cM1, Grade Group: 4) - Signed by Earlie Server, MD on 03/16/2020   01/17/2020 Imaging   scrotum ultrasound showed a small right testicular mass measuring 1.6 cm, contained an internal calcification. Appearance was consistent with primary testicular neoplasm. Normal size and appearance of the left testicle    01/17/2020 Surgery   S/p right orchiectomy  residual germ cell tumor and germ cell neoplasia in situ are not identified.  Case was discussed on tumor board.  Consensus reached a point although residual germ cell tumor/infection was not identified, the presence of scar with extensive tubular atrophy is compatible with a completely regressed germ cell tumor.   He went to [facility] for second opinion as I recommended. Due to long waiting time, he left without being seen. Dr.Rush from [facility] called and agrees with patient current treatment.    03/14/2020 Imaging   PET  . - small hypermetabolic lymph nodes along the RIGHT iliac vessels is most consistent with nodal metastasis.    04/12/2020 - 05/08/2020 Radiation Therapy   radiation to periaortic lymph nodes as well as right hemipelvis node.    12/19/2020 Imaging   CT chest abdomen pelvis showed interval resolution of retrocaval and right iliac lymphadenotomy. No new or progressive disease. Stable  sclerotic lesions. Non obstructing bilateral nephrolithiasis.    06/24/2021 Tumor Marker   PSA 1.80    11/08/2021 Tumor Marker   PSA 2.9    11/14/2021 Imaging   CT chest abdomen pelvis w contrast  No new or progressive findings on today's examination.   Unchanged multiple small sclerotic lesions in the thoracic and lumbar spine as well as the pelvis. Stable 4 mm right middle lobe nodule. Recommend continued attention on follow-up. Aortic Atherosclerosis   12/17/2021 Tumor Marker   PSA 3.27   05/06/2022 Tumor Marker   PSA 5.75      Patient has extensive cardiology issues.  He follows up with Dr. Ellyn Hack. Patient has history of STEMI-PCI to LAD with resultant ischemia cardiomyopathy, EF 35 to 40%, left bundle branch block with acute CVA-10/2018.  CT coronary showed nonobstructing plaquing of coronary distributions, intimal irregularity with filling defect in the ascending aorta but no LV thrombus.  And the patient is on warfarin and Plavix.  Patient was seen by thoracic surgeon Dr. Roxy Manns will recommend patient to be on Coumadin for minimum of 3 months.  With presentation with stroke, Dr. Ellyn Hack recommend at least 6 to 12 months of Coumadin. -Coumadin was discontinued by Dr. Ellyn Hack in September 2021 and switched to.  Plavix Patient lives with his sister.  He has 3 adult children.  His activity is quite limited due to shortness of breath with exertion due to  cardiology problems.  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  Patient has an ENT evaluation.  Status post biopsy- WARTHIN TUMOR.  Negative for atypia and malignancy. # He has had CRT-D implant.  # 12/19/2020 CT chest abdomen pelvis showed interval resolution of retrocaval and right iliac lymphadenotomy. No new or progressive disease. Stable sclerotic lesions. Non obstructing bilateral nephrolithiasis.  #06/14/2021, CT neck soft tissue with contrast showed multiple bilateral parotid masses, largest lesion on the  right has increased the size, now 5.1 x 2.9 x 4.9 cm.  Previously 4.1 cm in 2016.  INTERVAL HISTORY Daniel Weiss is a 62 y.o. male who has above history reviewed by me today presents for follow up visit for management of metastatic prostate cancer and germ cell tumor.  He has no new complaints today.   Review of Systems  Constitutional:  Positive for fatigue. Negative for appetite change, chills, fever and unexpected weight change.  HENT:   Negative for hearing loss and voice change.        Chronic neck mass  Eyes:  Negative for eye problems and icterus.  Respiratory:  Positive for shortness of breath. Negative for chest tightness and cough.   Cardiovascular:  Negative for chest pain and leg swelling.  Gastrointestinal:  Negative for abdominal distention and abdominal pain.  Endocrine: Positive for hot flashes.  Genitourinary:  Negative for difficulty urinating, dysuria and frequency.   Musculoskeletal:  Negative for arthralgias.  Skin:  Negative for itching and rash.  Neurological:  Negative for dizziness, light-headedness and numbness.  Hematological:  Negative for adenopathy. Does not bruise/bleed easily.  Psychiatric/Behavioral:  Negative for confusion.     MEDICAL HISTORY:  Past Medical History:  Diagnosis Date   Abnormal chest CT 02/2018   a. Ectatic 3 cm Ao arch - rec f/u outpt imaging w/ CTA or MRA. Ectatic atheromatous abd Ao @ risk for aneurysm - rec f/u u/s in 5 yrs. Marked prostatic enlargement w/ scattered small sclerotic foci in the thoracic lower lumbar spine, sacrum, left 12th rib, pelvis, and right femur.  Recommend elective outpatient whole-body bone scintigraphy and PSA.   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  02/2018 ACS/PCI: LM nl, LAD 85p (3.5x15 Sierra DES), D1 45ost, RI 55ost, RCA nl, EF 25-35%.   Cardiac murmur    Chest pain on exertion 11/28/2019   Chronic combined systolic and diastolic CHF, NYHA class 2 ([place]) 04/17/2020   Complete left bundle branch block (LBBB) 2016   Coronary artery disease involving native coronary artery of native heart with angina pectoris ([place]) 03/10/2018   Dyspnea    Encephalopathy, hypertensive    Essential hypertension 07/07/2014   Germ cell tumor ([place]) 12/22/2019   Goals of care, counseling/discussion 12/22/2019   Hematemesis without nausea    HFrEF (heart failure with reduced ejection fraction) ([place])    History of CVA (cerebrovascular accident) 11/23/2018   History of non-ST elevation myocardial infarction (NSTEMI) 02/21/2019   Hypertension    Hypokalemia    Hyponatremia 06/29/2015   Ischemic cardiomyopathy 02/2018   a. 02/2018 Echo: EF 35-40% (LV gram on cath was 25-30%), mod, diff HK. mid-apicalanteroseptal, ant, and apical sev HK. RVH.-->  No notable change on  echo from June 2020 and August 2020.   Localized swelling, mass or lump of neck    Long term (current) use of anticoagulants 11/16/2018   Malnutrition of moderate degree 11/11/2018   Mass of right side of neck 07/07/2014   Myocardial infarction [facility])    Neck mass    Right Neck mass - US neck showed 6.3 cm complex, partially cystic, partially solid mass.  CT neck showed 2 cm mass from parotid gland on right, with 3x4x5cm  Possible metastatic lymphadenopathy.  we recommended following up with Dr. Redmond Baseman ENT for biopsy of this.      Non-ST elevation (NSTEMI) myocardial infarction ([place]) 03/06/2018   NSTEMI (non-ST elevated myocardial infarction) ([place]) 02/2018   85% pLAD - DES PCI    Paresthesia 07/07/2014   Prostate cancer ([name]) 03/10/2018   Prostate cancer metastatic to multiple sites [facility]) 11/2018   Right sided weakness 07/07/2014   Stroke [facility])    TIA (transient ischemic attack) 2016   Warthin's tumor      SURGICAL HISTORY: Past Surgical History:  Procedure Laterality Date   BIV ICD INSERTION CRT-D N/A 06/06/2020   Procedure: BIV ICD INSERTION CRT-D;  Surgeon: Constance Haw, MD;  Location: [place] CV LAB;  Service: Cardiovascular;  Laterality: N/A;   COLONOSCOPY WITH PROPOFOL N/A 02/27/2020   Procedure: COLONOSCOPY WITH PROPOFOL;  Surgeon: Yetta Flock, MD;  Location: [place];  Service: Gastroenterology;  Laterality: N/A;   CORONARY STENT INTERVENTION N/A 03/06/2018   Procedure: CORONARY STENT INTERVENTION;  Surgeon: [name], MD;  Location: [place] CV LAB;  Service: Cardiovascular: Proximal LAD 85% stenosis (and D1): DES PCI with Xience Sierra DES 3.5 mm x 15 mm - 3.9 mm   ESOPHAGOGASTRODUODENOSCOPY (EGD) WITH PROPOFOL N/A 02/27/2020   Procedure: ESOPHAGOGASTRODUODENOSCOPY (EGD) WITH PROPOFOL;  Surgeon: Yetta Flock, MD;  Location: [facility] ENDOSCOPY;  Service: Gastroenterology;  Laterality: N/A;   HEMOSTASIS CLIP PLACEMENT  02/27/2020   Procedure: HEMOSTASIS CLIP PLACEMENT;  Surgeon: Yetta Flock, MD;  Location: [place] ENDOSCOPY;  Service: Gastroenterology;;   HOT HEMOSTASIS N/A 02/27/2020   Procedure: HOT HEMOSTASIS (ARGON PLASMA COAGULATION/BICAP);  Surgeon: Yetta Flock, MD;  Location: [facility] ENDOSCOPY;  Service: Gastroenterology;  Laterality: N/A;   INTRAOPERATIVE TRANSTHORACIC ECHOCARDIOGRAM  03/06/2018   (Peri-MI) mild reduced EF 35-40%.  Diffuse hypokinesis (severe HK of the mid-apical anteroseptal, anterior and apical myocardium consistent with LAD infarct).  Unable to assess diastolic function.  Paradoxical septal motion likely related to his LBBB.  RV hypertrophy noted.  Trivial pericardial effusion noted.    LEFT HEART CATH AND CORONARY ANGIOGRAPHY N/A 03/06/2018   Procedure: LEFT HEART CATH AND CORONARY ANGIOGRAPHY;  Surgeon: [name], MD;  Location: [place] CV LAB;  Service: Cardiovascular: Proximal LAD 85% involving D1  45%.  Ostial RI 55%.  Severe LV dysfunction EF 25 to 30%.  1+ MR.  Only minimally elevated LVEDP.   ORCHIECTOMY Right 01/17/2020   Procedure: ORCHIECTOMY;  Surgeon: Abbie Sons, MD;  Location: [facility] ORS;  Service: Urology;  Laterality: Right;   POLYPECTOMY  02/27/2020   Procedure: POLYPECTOMY;  Surgeon: Yetta Flock, MD;  Location: [facility] ENDOSCOPY;  Service: Gastroenterology;;   PROSTATE BIOPSY N/A 01/17/2020   Procedure: BIOPSY TRANSRECTAL ULTRASONIC PROSTATE (TUBP);  Surgeon: Abbie Sons, MD;  Location: [facility] ORS;  Service: Urology;  Laterality: N/A;   TRANSTHORACIC ECHOCARDIOGRAM  10/2018   (Most recent echo, to evaluate for TIA/CVA) : Moderate reduced EF 35 to 40%.  Moderate concentric LVH.  Mild dyskinesis of the apex and severe hypokinesis of mid apical anteroseptal and anterior wall.  Unable to assess RV pressures.  Aortic sclerosis with no stenosis.  No significant change seen    SOCIAL HISTORY: Social History   Socioeconomic History   Marital status: Legally Separated    Spouse name: Dorothenia   Number of children: Not on file   Years of education: Not on file   Highest education level: Not on file  Occupational History   Occupation: cook  Tobacco Use   Smoking status: Some Days    Packs/day: 0.00    Years: 0.00    Total pack years: 0.00    Types: Cigarettes   Smokeless tobacco: Never   Tobacco comments:    quit three days ago  Vaping Use   Vaping Use: Never used  Substance and Sexual Activity   Alcohol use: Yes    Alcohol/week: 2.0 standard drinks of alcohol    Types: 2 Cans of beer per week    Comment: 2 Beers daily.   Drug use: Yes    Frequency: 3.0 times per week    Types: Marijuana   Sexual activity: Not Currently  Other Topics Concern   Not on file  Social History Narrative   Currently not working.  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  Now has cancer.   Social Determinants of Health   Financial Resource Strain:  Not on file  Food Insecurity: Not on file  Transportation Needs: No Transportation Needs (11/22/2018)   PRAPARE - Hydrologist (Medical): No    Lack of Transportation (Non-Medical): No  Physical Activity: Not on file  Stress: Not on file  Social Connections: Not on file  Intimate Partner Violence: Not on file    FAMILY HISTORY: Family History  Problem Relation Age of Onset   Stroke Mother    Hypertension Mother    Diabetes type II Mother    Leukemia Other    Breast cancer Other    Stroke Brother    CAD Neg Hx     ALLERGIES:  has No Known Allergies.  MEDICATIONS:  Current Outpatient Medications  Medication Sig Dispense Refill   bicalutamide (CASODEX) 50 MG tablet Take 1 tablet by mouth twice daily 60 tablet 0   clopidogrel (PLAVIX) 75 MG tablet Take 1 tablet by mouth once daily 90 tablet 3   docusate sodium (COLACE) 100 MG capsule Take 1 capsule (100 mg total) by mouth daily. 30 capsule 3   ezetimibe (ZETIA) 10 MG tablet Take 1 tablet by mouth once daily 90 tablet 0   ferrous sulfate 325 (65 FE) MG EC tablet Take 1 tablet (325 mg total) by mouth 2 (two) times daily with a meal. 60 tablet 3   furosemide (LASIX) 20 MG tablet May take 20 mg tablet as needed for increase shortness of breath or swelling 20 tablet 6   metoprolol succinate (TOPROL-XL) 50 MG 24 hr tablet Take 1 tablet (50 mg total) by mouth daily. Take with or immediately following a meal. 90 tablet 3   rosuvastatin (CRESTOR) 40 MG tablet Take 1 tablet (40 mg total) by mouth daily. 90 tablet 3   sacubitril-valsartan (ENTRESTO) 97-103 MG Take 1 tablet by mouth 2 (two) times daily. 180 tablet 2   spironolactone (ALDACTONE) 25 MG tablet Take 1 tablet (25 mg total) by mouth daily. 90 tablet 3  tamsulosin (FLOMAX) 0.4 MG CAPS capsule Take 1 capsule (0.4 mg total) by mouth daily after supper. 90 capsule 3   traZODone (DESYREL) 50 MG tablet Take 0.5-1 tablets (25-50 mg total) by mouth at bedtime as  needed for sleep. 30 tablet 6   venlafaxine XR (EFFEXOR XR) 37.5 MG 24 hr capsule Take 1 capsule (37.5 mg total) by mouth daily with breakfast. 30 capsule 1   VENTOLIN HFA 108 (90 Base) MCG/ACT inhaler INHALE 2 PUFFS BY MOUTH EVERY 6 HOURS AS NEEDED FOR WHEEZING OR SHORTNESS OF BREATH 18 g 0   nitroGLYCERIN (NITROSTAT) 0.4 MG SL tablet Place 1 tablet (0.4 mg total) under the tongue every 5 (five) minutes as needed for chest pain. (Patient not taking: Reported on 11/08/2021) 25 tablet 3   ondansetron (ZOFRAN) 4 MG tablet Take 4 mg by mouth every 4 (four) hours as needed for nausea or vomiting. (Patient not taking: Reported on 11/08/2021)     No current facility-administered medications for this visit.   Facility-Administered Medications Ordered in Other Visits  Medication Dose Route Frequency Provider Last Rate Last Admin   leuprolide (6 Month) (ELIGARD) injection 45 mg  45 mg Subcutaneous Once Earlie Server, MD         PHYSICAL EXAMINATION: ECOG PERFORMANCE STATUS: 1 - Symptomatic but completely ambulatory Vitals:   05/07/22 1024 05/07/22 1026  BP: (!) 146/79 131/87  Pulse: 74 72  Resp: 18 18  Temp: (!) 96 F (35.6 C) (!) 96 F (35.6 C)   Filed Weights   05/07/22 1024 05/07/22 1026  Weight: 173 lb 8 oz (78.7 kg) 173 lb 8 oz (78.7 kg)    Physical Exam Constitutional:      General: He is not in acute distress.    Comments: Thin built.   HENT:     Head: Normocephalic and atraumatic.  Eyes:     General: No scleral icterus. Neck:     Comments: Palpable right cervical mass Cardiovascular:     Rate and Rhythm: Normal rate.  Pulmonary:     Effort: Pulmonary effort is normal. No respiratory distress.  Abdominal:     General: Bowel sounds are normal. There is no distension.     Palpations: Abdomen is soft.  Musculoskeletal:        General: No deformity. Normal range of motion.     Cervical back: Normal range of motion and neck supple.  Skin:    General: Skin is warm and dry.      Findings: No erythema or rash.  Neurological:     Mental Status: He is alert and oriented to person, place, and time. Mental status is at baseline.     Cranial Nerves: No cranial nerve deficit.  Psychiatric:        Mood and Affect: Mood normal.     LABORATORY DATA:  I have reviewed the data as listed    Latest Ref Rng & Units 05/06/2022   11:00 AM 12/17/2021   11:16 AM 11/20/2021   11:08 AM  CBC  WBC 4.0 - 10.5 K/uL 4.2  4.3  5.7   Hemoglobin 13.0 - 17.0 g/dL 11.9  12.8  13.4   Hematocrit 39.0 - 52.0 % 37.8  38.5  41.4   Platelets 150 - 400 K/uL 405  403  376       Latest Ref Rng & Units 05/06/2022   11:00 AM 12/17/2021   11:16 AM 11/20/2021   11:08 AM  CMP  Glucose 70 - 99  mg/dL 113  92  119   BUN 8 - 23 mg/dL '13  10  12   '$ Creatinine 0.61 - 1.24 mg/dL 0.69  0.70  1.08   Sodium 135 - 145 mmol/L 138  136  137   Potassium 3.5 - 5.1 mmol/L 3.5  3.4  3.5   Chloride 98 - 111 mmol/L 105  103  102   CO2 22 - 32 mmol/L '25  24  21   '$ Calcium 8.9 - 10.3 mg/dL 9.8  9.5  10.1   Total Protein 6.5 - 8.1 g/dL 8.5  8.3  9.4   Total Bilirubin 0.3 - 1.2 mg/dL 0.6  1.0  0.9   Alkaline Phos 38 - 126 U/L 68  91  103   AST 15 - 41 U/L '22  22  31   '$ ALT 0 - 44 U/L '12  16  14      '$ RADIOGRAPHIC STUDIES: I have personally reviewed the radiological images as listed and agreed with the findings in the report. No results found.

## 2022-05-08 LAB — TESTOSTERONE: Testosterone: <3 ng/dL — ABNORMAL LOW (ref 264–916)

## 2022-05-20 ENCOUNTER — Ambulatory Visit: Payer: Medicare Other

## 2022-05-22 ENCOUNTER — Ambulatory Visit: Payer: Medicare Other | Admitting: Radiation Oncology

## 2022-05-29 ENCOUNTER — Encounter
Admission: RE | Admit: 2022-05-29 | Discharge: 2022-05-29 | Disposition: A | Discharge status: Home / Self Care | Payer: Medicare Other | Source: Ambulatory Visit | Attending: Oncology | Admitting: Oncology

## 2022-05-29 DIAGNOSIS — C61 Malignant neoplasm of prostate: Secondary | ICD-10-CM | POA: Diagnosis not present

## 2022-05-29 MED ORDER — PIFLIFOLASTAT F 18 (PYLARIFY) INJECTION
9.0000 | Freq: Once | INTRAVENOUS | Status: AC
  Administered 2022-05-29: 9.5 via INTRAVENOUS

## 2022-06-02 ENCOUNTER — Encounter: Payer: Self-pay | Admitting: Radiation Oncology

## 2022-06-02 ENCOUNTER — Ambulatory Visit
Admission: RE | Admit: 2022-06-02 | Discharge: 2022-06-02 | Disposition: A | Discharge status: Home / Self Care | Payer: Medicare Other | Source: Ambulatory Visit | Attending: Radiation Oncology | Admitting: Radiation Oncology

## 2022-06-02 VITALS — BP 156/90 | HR 82 | Temp 97.2°F | Resp 20 | Wt 171.0 lb

## 2022-06-02 DIAGNOSIS — R9721 Rising PSA following treatment for malignant neoplasm of prostate: Secondary | ICD-10-CM | POA: Diagnosis not present

## 2022-06-02 DIAGNOSIS — C801 Malignant (primary) neoplasm, unspecified: Secondary | ICD-10-CM

## 2022-06-02 DIAGNOSIS — C7951 Secondary malignant neoplasm of bone: Secondary | ICD-10-CM | POA: Insufficient documentation

## 2022-06-02 DIAGNOSIS — Z8547 Personal history of malignant neoplasm of testis: Secondary | ICD-10-CM | POA: Diagnosis not present

## 2022-06-02 DIAGNOSIS — C61 Malignant neoplasm of prostate: Secondary | ICD-10-CM | POA: Diagnosis present

## 2022-06-02 NOTE — Progress Notes (Signed)
Radiation Oncology Follow up Note  Name: Daniel Weiss   Date:   06/02/2022 MRN:  [id number] DOB: [birth year]    This 62 y.o. 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  He has known stage IV prostate cancer.Mar[place] Kitchen  He is doing fairly well he is currently on androgen deprivation therapy.  His PSA seems to be increasing indicating castrate resistant disease at this point.  Medical oncology is entertaining adding Xtandi and possible chemotherapy.  He has known prior bone metastasis although recent PSMA PET scan showed in intense radiotracer activity within the prostate gland with resolution of right iliac adenopathy and no evidence of metastatic adenopathy in the pelvis or periaortic retroperitoneum and no evidence of visceral or skeletal metastasis.  COMPLICATIONS OF TREATMENT: none  FOLLOW UP COMPLIANCE: keeps appointments   PHYSICAL EXAM:  BP (!) 156/90 Comment: BP recheck BP still elevated patient advised to monitor at home and see PCP if it remains elevated  Pulse 82   Temp (!) 97.2 F (36.2 C) (Tympanic)   Resp 20   Wt 171 lb (77.6 kg)   BMI 23.19 kg/m  Well-developed well-nourished patient in NAD. HEENT reveals PERLA, EOMI, discs not visualized.  Oral cavity is clear. No oral mucosal lesions are identified. Neck is clear without evidence of cervical or supraclavicular adenopathy. Lungs are clear to A&P. Cardiac examination is essentially unremarkable with regular rate and rhythm without murmur rub or thrill. Abdomen is benign with no organomegaly or masses noted. Motor sensory and DTR levels are equal and symmetric in the upper and lower extremities. Cranial nerves II through XII are grossly intact.  Proprioception is intact. No peripheral adenopathy or edema is identified. No motor or sensory levels are noted. Crude visual fields are within normal range.  RADIOLOGY RESULTS: PSMA PET scan reviewed compatible with above-stated findings  PLAN: This time patient continues follow-up care with medical oncology and possible chemotherapy as well Xtandi treatment.  No areas of bone metastasis which I would elect to radiate at this time.  I am going to turn follow-up care over to medical oncology.  I be happy to reevaluate this patient in time should that be indicated.  Patient is to call with any concerns.  I would like to take this opportunity to thank you for allowing me to participate in the care of your patient.Noreene Filbert, MD

## 2022-06-03 ENCOUNTER — Other Ambulatory Visit: Payer: Self-pay | Admitting: Oncology

## 2022-06-03 MED ORDER — XTANDI 40 MG PO CAPS: 160.0000 mg | ORAL_CAPSULE | Freq: Every day | ORAL | 0 refills | Status: DC

## 2022-06-04 ENCOUNTER — Telehealth: Payer: Self-pay

## 2022-06-04 ENCOUNTER — Telehealth: Payer: Self-pay | Admitting: Pharmacist

## 2022-06-04 ENCOUNTER — Encounter: Payer: Self-pay | Admitting: Oncology

## 2022-06-04 ENCOUNTER — Other Ambulatory Visit (HOSPITAL_COMMUNITY): Payer: Self-pay

## 2022-06-04 ENCOUNTER — Other Ambulatory Visit: Payer: Self-pay

## 2022-06-04 DIAGNOSIS — C61 Malignant neoplasm of prostate: Secondary | ICD-10-CM

## 2022-06-04 MED ORDER — ENZALUTAMIDE 40 MG PO TABS
160.0000 mg | ORAL_TABLET | Freq: Every day | ORAL | 0 refills | Status: DC
  Filled 2022-06-04 (×2): qty 120, 30d supply, fill #0

## 2022-06-04 MED ORDER — XTANDI 40 MG PO CAPS
160.0000 mg | ORAL_CAPSULE | Freq: Every day | ORAL | 0 refills | Status: DC
  Filled 2022-06-04: qty 120, 30d supply, fill #0

## 2022-06-04 NOTE — Telephone Encounter (Signed)
Oral Chemotherapy Pharmacist Encounter  Patient Education I spoke with patient for overview of new oral chemotherapy medication: Xtandi (enzalutamide) for the treatment of metastatic castration resistant prostate cancer in conjunction with ADT, planned duration until disease progression or unacceptable drug toxicity   Counseled patient on administration, dosing, side effects, monitoring, drug-food interactions, safe handling, storage, and disposal. Patient will take 4 tablets (160 mg total) by mouth daily. .  Side effects include but not limited to: fatigue and hypertension.    Reviewed with patient importance of keeping a medication schedule and plan for any missed doses.  After discussion with patient no patient barriers to medication adherence identified.   Daniel Weiss voiced understanding and appreciation. All questions answered. Medication handout provided.  Provided patient with [facility] phone number. Patient knows to call the office with questions or concerns. Oral Chemotherapy Navigation Clinic will continue to follow.  Darl Pikes, PharmD, BCPS, BCOP, CPP Hematology/Oncology Clinical Pharmacist Practitioner [place]/DB/AP [facility] [phone]  06/04/2022 11:36 AM

## 2022-06-04 NOTE — Telephone Encounter (Addendum)
Patient successfully OnBoarded and provided drug education by pharmacist. Gillermina Phy scheduled to be delivered to patient's address on Friday 06/06/22 from [facility].    Berdine Addison, [facility] Patient [place]  [phone] (phone) [phone] (fax) 06/04/2022 11:44 AM

## 2022-06-04 NOTE — Telephone Encounter (Signed)
-----   Message from Earlie Server, MD sent at 06/03/2022  9:59 PM EDT ----- I recommend him to start Xtandi 160mg  daily. Discussed with him during his last visit.  Alyson and Marland Kitchen, would you please check his insurance approval. Ok to start once he receives the supply. I plan to see him 2 weeks after start, lab MD cbc cmp PSA  Thank you   zy

## 2022-06-04 NOTE — Telephone Encounter (Signed)
Oral Oncology Pharmacist Encounter  Received new prescription for Xtandi (enzalutamide) for the treatment of metastatic castration resistant prostate cancer in conjunction with ADT, planned duration until disease progression or unacceptable drug toxicity.  Prescription dose and frequency assessed.   Current medication list in Epic reviewed, a few DDIs with enzalutamide identified: Enzalutamide may decrease the serum concentration of trazodone. Monitor patient for decreased effectiveness of trazodone. No baseline dose adjustment needed.  Clopidogrel: clopidogrel may increase serum concentrations of the active metabolite(s) of enzalutamide. Recommended to monitor for increased enzalutamide toxicities, no baseline dose adjustment needed.   Evaluated chart and no patient barriers to medication adherence identified.   Prescription has been e-scribed to the [facility] for benefits analysis and approval.  Oral Oncology Clinic will continue to follow for insurance authorization, copayment issues, initial counseling and start date.   Darl Pikes, PharmD, BCPS, BCOP, CPP Hematology/Oncology Clinical Pharmacist Practitioner [place]/DB/AP Oral [facility] [phone]  06/04/2022 10:36 AM

## 2022-06-04 NOTE — Telephone Encounter (Signed)
  Spoke with allyson and pt is set up for Med delivery by Friday 06/06/22. He will start once he gets supply.   Plesae schedule and inform pt of appt:  Lab/MD/ Alyson at the end of the week of 4/8.

## 2022-06-04 NOTE — Telephone Encounter (Signed)
Oral Oncology Patient Advocate Encounter  After completing a benefits investigation, prior authorization for Gillermina Phy is not required at this time through [facility].  Patient's copay is $4.60.     Berdine Addison, [facility] Patient [place]  [phone] (phone) [phone] (fax) 06/04/2022 8:09 AM

## 2022-06-05 ENCOUNTER — Ambulatory Visit (INDEPENDENT_AMBULATORY_CARE_PROVIDER_SITE_OTHER): Payer: Medicare Other

## 2022-06-05 DIAGNOSIS — I255 Ischemic cardiomyopathy: Secondary | ICD-10-CM

## 2022-06-05 LAB — CUP PACEART REMOTE DEVICE CHECK
Battery Remaining Longevity: 55 mo
Battery Remaining Percentage: 69 %
Battery Voltage: 2.96 V
Brady Statistic AP VP Percent: 6.8 %
Brady Statistic AP VS Percent: 1 %
Brady Statistic AS VP Percent: 89 %
Brady Statistic AS VS Percent: 4.3 %
Brady Statistic RA Percent Paced: 6.9 %
Date Time Interrogation Session: 20240328030032
HighPow Impedance: 70 Ohm
Implantable Lead Connection Status: 753985
Implantable Lead Connection Status: 753985
Implantable Lead Connection Status: 753985
Implantable Lead Implant Date: 20220330
Implantable Lead Implant Date: 20220330
Implantable Lead Implant Date: 20220330
Implantable Lead Location: 753858
Implantable Lead Location: 753859
Implantable Lead Location: 753860
Implantable Pulse Generator Implant Date: 20220330
Lead Channel Impedance Value: 340 Ohm
Lead Channel Impedance Value: 400 Ohm
Lead Channel Impedance Value: 750 Ohm
Lead Channel Pacing Threshold Amplitude: 0.75 V
Lead Channel Pacing Threshold Amplitude: 0.75 V
Lead Channel Pacing Threshold Amplitude: 1.75 V
Lead Channel Pacing Threshold Pulse Width: 0.5 ms
Lead Channel Pacing Threshold Pulse Width: 0.5 ms
Lead Channel Pacing Threshold Pulse Width: 0.5 ms
Lead Channel Sensing Intrinsic Amplitude: 1 mV
Lead Channel Sensing Intrinsic Amplitude: 12 mV
Lead Channel Setting Pacing Amplitude: 2 V
Lead Channel Setting Pacing Amplitude: 2.5 V
Lead Channel Setting Pacing Amplitude: 3 V
Lead Channel Setting Pacing Pulse Width: 0.5 ms
Lead Channel Setting Pacing Pulse Width: 0.5 ms
Lead Channel Setting Sensing Sensitivity: 0.5 mV
Pulse Gen Serial Number: 111039291
Zone Setting Status: 755011

## 2022-06-17 ENCOUNTER — Telehealth: Payer: Self-pay | Admitting: Oncology

## 2022-06-17 NOTE — Telephone Encounter (Signed)
Patient called and requested to reschedule his appointments here at the cancer center until Mid-May. He stated that he wanted to spend time "visiting family" prior to starting any treatment.   Routing to clinical team to make aware.

## 2022-06-19 ENCOUNTER — Inpatient Hospital Stay: Payer: Medicare Other

## 2022-06-19 ENCOUNTER — Inpatient Hospital Stay: Payer: Medicare Other | Admitting: Pharmacist

## 2022-06-19 ENCOUNTER — Inpatient Hospital Stay: Payer: Medicare Other | Admitting: Oncology

## 2022-06-23 ENCOUNTER — Other Ambulatory Visit: Payer: Self-pay

## 2022-06-23 ENCOUNTER — Other Ambulatory Visit: Payer: Self-pay | Admitting: Oncology

## 2022-06-23 DIAGNOSIS — C61 Malignant neoplasm of prostate: Secondary | ICD-10-CM

## 2022-06-24 ENCOUNTER — Encounter: Payer: Self-pay | Admitting: Oncology

## 2022-06-24 ENCOUNTER — Other Ambulatory Visit (HOSPITAL_COMMUNITY): Payer: Self-pay

## 2022-06-25 ENCOUNTER — Other Ambulatory Visit (HOSPITAL_COMMUNITY): Payer: Self-pay

## 2022-07-08 ENCOUNTER — Encounter: Payer: Self-pay | Admitting: Oncology

## 2022-07-09 ENCOUNTER — Encounter: Payer: Self-pay | Admitting: Oncology

## 2022-07-09 NOTE — Progress Notes (Signed)
Remote ICD transmission.   

## 2022-07-14 ENCOUNTER — Other Ambulatory Visit: Payer: Self-pay

## 2022-07-14 ENCOUNTER — Other Ambulatory Visit (HOSPITAL_COMMUNITY): Payer: Self-pay

## 2022-07-14 ENCOUNTER — Encounter: Payer: Self-pay | Admitting: Oncology

## 2022-07-14 ENCOUNTER — Telehealth: Payer: Self-pay

## 2022-07-14 ENCOUNTER — Inpatient Hospital Stay: Payer: Medicare Other | Admitting: Pharmacist

## 2022-07-14 ENCOUNTER — Inpatient Hospital Stay: Payer: Medicare Other | Attending: Oncology

## 2022-07-14 ENCOUNTER — Inpatient Hospital Stay (HOSPITAL_BASED_OUTPATIENT_CLINIC_OR_DEPARTMENT_OTHER): Payer: Medicare Other | Admitting: Oncology

## 2022-07-14 VITALS — BP 124/79 | HR 71 | Temp 97.8°F | Resp 20 | Wt 174.3 lb

## 2022-07-14 DIAGNOSIS — C61 Malignant neoplasm of prostate: Secondary | ICD-10-CM

## 2022-07-14 DIAGNOSIS — Z79818 Long term (current) use of other agents affecting estrogen receptors and estrogen levels: Secondary | ICD-10-CM

## 2022-07-14 DIAGNOSIS — C7951 Secondary malignant neoplasm of bone: Secondary | ICD-10-CM | POA: Insufficient documentation

## 2022-07-14 DIAGNOSIS — D508 Other iron deficiency anemias: Secondary | ICD-10-CM

## 2022-07-14 DIAGNOSIS — C801 Malignant (primary) neoplasm, unspecified: Secondary | ICD-10-CM | POA: Diagnosis not present

## 2022-07-14 DIAGNOSIS — D509 Iron deficiency anemia, unspecified: Secondary | ICD-10-CM | POA: Insufficient documentation

## 2022-07-14 DIAGNOSIS — C6211 Malignant neoplasm of descended right testis: Secondary | ICD-10-CM

## 2022-07-14 DIAGNOSIS — Z79899 Other long term (current) drug therapy: Secondary | ICD-10-CM | POA: Diagnosis not present

## 2022-07-14 LAB — CBC WITH DIFFERENTIAL (CANCER CENTER ONLY)
Abs Immature Granulocytes: 0.01 10*3/uL (ref 0.00–0.07)
Basophils Absolute: 0 10*3/uL (ref 0.0–0.1)
Basophils Relative: 1 %
Eosinophils Absolute: 0.1 10*3/uL (ref 0.0–0.5)
Eosinophils Relative: 2 %
HCT: 31 % — ABNORMAL LOW (ref 39.0–52.0)
Hemoglobin: 9.9 g/dL — ABNORMAL LOW (ref 13.0–17.0)
Immature Granulocytes: 0 %
Lymphocytes Relative: 35 %
Lymphs Abs: 1.5 10*3/uL (ref 0.7–4.0)
MCH: 25.2 pg — ABNORMAL LOW (ref 26.0–34.0)
MCHC: 31.9 g/dL (ref 30.0–36.0)
MCV: 78.9 fL — ABNORMAL LOW (ref 80.0–100.0)
Monocytes Absolute: 0.7 10*3/uL (ref 0.1–1.0)
Monocytes Relative: 16 %
Neutro Abs: 2 10*3/uL (ref 1.7–7.7)
Neutrophils Relative %: 46 %
Platelet Count: 317 10*3/uL (ref 150–400)
RBC: 3.93 MIL/uL — ABNORMAL LOW (ref 4.22–5.81)
RDW: 18.6 % — ABNORMAL HIGH (ref 11.5–15.5)
WBC Count: 4.3 10*3/uL (ref 4.0–10.5)
nRBC: 0 % (ref 0.0–0.2)

## 2022-07-14 LAB — IRON AND TIBC
Iron: 38 ug/dL — ABNORMAL LOW (ref 45–182)
Saturation Ratios: 8 % — ABNORMAL LOW (ref 17.9–39.5)
TIBC: 487 ug/dL — ABNORMAL HIGH (ref 250–450)
UIBC: 449 ug/dL

## 2022-07-14 LAB — RETIC PANEL
Immature Retic Fract: 29.8 % — ABNORMAL HIGH (ref 2.3–15.9)
RBC.: 3.96 MIL/uL — ABNORMAL LOW (ref 4.22–5.81)
Retic Count, Absolute: 59.4 10*3/uL (ref 19.0–186.0)
Retic Ct Pct: 1.5 % (ref 0.4–3.1)
Reticulocyte Hemoglobin: 27.6 pg — ABNORMAL LOW (ref >27.9)

## 2022-07-14 LAB — CMP (CANCER CENTER ONLY)
ALT: 10 U/L (ref 0–44)
AST: 16 U/L (ref 15–41)
Albumin: 3.8 g/dL (ref 3.5–5.0)
Alkaline Phosphatase: 53 U/L (ref 38–126)
Anion gap: 8 (ref 5–15)
BUN: 13 mg/dL (ref 8–23)
CO2: 24 mmol/L (ref 22–32)
Calcium: 9.3 mg/dL (ref 8.9–10.3)
Chloride: 110 mmol/L (ref 98–111)
Creatinine: 0.77 mg/dL (ref 0.61–1.24)
GFR, Estimated: >60 mL/min (ref >60)
Glucose, Bld: 111 mg/dL — ABNORMAL HIGH (ref 70–99)
Potassium: 3.9 mmol/L (ref 3.5–5.1)
Sodium: 142 mmol/L (ref 135–145)
Total Bilirubin: 0.3 mg/dL (ref 0.3–1.2)
Total Protein: 7 g/dL (ref 6.5–8.1)

## 2022-07-14 LAB — FERRITIN: Ferritin: 5 ng/mL — ABNORMAL LOW (ref 24–336)

## 2022-07-14 LAB — PSA: Prostatic Specific Antigen: 0.41 ng/mL (ref 0.00–4.00)

## 2022-07-14 MED ORDER — FERROUS SULFATE 325 (65 FE) MG PO TBEC
325.0000 mg | DELAYED_RELEASE_TABLET | Freq: Two times a day (BID) | ORAL | 3 refills | Status: AC
Start: 2022-07-14 — End: ?

## 2022-07-14 MED ORDER — ENZALUTAMIDE 40 MG PO TABS
160.0000 mg | ORAL_TABLET | Freq: Every day | ORAL | 0 refills | Status: DC
Start: 2022-07-14 — End: 2022-08-18
  Filled 2022-07-14 – 2022-07-22 (×2): qty 120, 30d supply, fill #0

## 2022-07-14 NOTE — Telephone Encounter (Signed)
-----   Message from Rickard Patience, MD sent at 07/14/2022 10:29 AM EDT ----- He is iron deficient.  Recommend him to take iron supplementation.  Sent to pharmacy.  I will refill Xtandi too, same dosage.

## 2022-07-14 NOTE — Addendum Note (Signed)
Addended by: Remi Haggard on: 07/14/2022 10:32 AM   Modules accepted: Orders

## 2022-07-14 NOTE — Assessment & Plan Note (Signed)
Hemoglobin has significantly decreased, iron panel indicates iron deficiency.  Recommend oral iron supplementation- Ferrous sulfate 325mg  BID

## 2022-07-14 NOTE — Assessment & Plan Note (Signed)
PMSA showed no bone metastatic disease. Continue monitor.

## 2022-07-14 NOTE — Progress Notes (Signed)
Oral Chemotherapy Clinic [facility]  [phone] [phone]  Patient Care Team: Pcp, No as PCP - General Marykay Lex, MD as PCP - Cardiology (Cardiology) Regan Lemming, MD as PCP - Electrophysiology (Cardiology) Ronne Binning Mardene Celeste, MD as Consulting Physician (Urology)   Name of the patient: Daniel Weiss  [id number]  [birth year]   Date of visit: 07/14/22  HPI: Patient is a 62 y.o. male with metastatic castration resistant prostate cancer. Currently treated with enzalutamide and ADT, enzalutamide started at the end on 05/2022.  Reason for Consult: Oral chemotherapy follow-up for enzalutamide therapy.   PAST MEDICAL HISTORY: Past Medical History:  Diagnosis Date   Abnormal chest CT 02/2018   a. Ectatic 3 cm Ao arch - rec f/u outpt imaging w/ CTA or MRA. Ectatic atheromatous abd Ao @ risk for aneurysm - rec f/u u/s in 5 yrs. Marked prostatic enlargement w/ scattered small sclerotic foci in the thoracic lower lumbar spine, sacrum, left 12th rib, pelvis, and right femur.  Recommend elective outpatient whole-body bone scintigraphy and PSA.   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 02/2018 ACS/PCI: LM nl, LAD 85p (3.5x15 [place] DES), D1 45ost, RI 55ost, RCA nl, EF 25-35%.   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 02/2018 Echo: EF 35-40% (LV gram on cath was 25-30%), mod, diff HK. mid-apicalanteroseptal, ant, and apical sev HK. RVH.-->  No notable change on echo from June 2020 and August 2020.   Localized swelling, mass or lump of neck    Long term (current) use of anticoagulants 11/16/2018   Malnutrition of moderate degree 11/11/2018   Mass of right side of neck 07/07/2014   Myocardial infarction [facility])    Neck mass    Right Neck mass - US neck showed 6.3 cm complex, partially cystic, partially solid mass.  CT neck showed 2 cm mass from parotid gland on right, with 3x4x5cm  Possible metastatic lymphadenopathy.  we recommended following up with Dr. Jenne Pane ENT for biopsy of this.      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  His previous urology care was with Dr. Ronne Binning. 11/05/2018 bone scan showed solitary punctate focus of activity in the left first sacral segment corresponding to a 9 mm mixed lytic and sclerotic  metastatic's on recent CT.  The remaining numerous sclerotic osseous metastasis identified on CT do not demonstrate abnormal activity and are therefore healed.  At that time No tissue diagnosis/biopsy was obtained. Patient was started with Lupron and Casodex Lupron was eventually to Eligard Patient [facility], PSA was 13.13, testosterone level less than 10. Casodex was discontinued in June 2021 10/26/2019 patient switched to [facility] urology and was seen by Dr. Lonna Cobb Per urology note, PSA trend: 11/03/2018: 502 11/22/2018: 377 02/21/2019: 387 04/25/2019: 123 09/05/2019: 13.3 Patient was continued on Eligard 45mg  Q6 months.    10/06/2019 Imaging   bone scan showed resolution of punctated activity over the left sacrum. The punctate area of increased activity over the right lower lumbar spine again noted.    10/31/2019 Imaging   CT angio chest aorta showed no evidence of aortic aneurysm or dissection.  Atherosclerotic calcification spleen seen in the upper abdominal aorta.  Chronic artery disease.  No acute cardiopulmonary disease. Patient has a chronic right neck mass.  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    11/18/2019 Imaging   11/18/2019 CT abdomen pelvis with contrast showed retrocaval lymphadenopathy in the right common and external iliac chains.  Stable to minimally progressed in the interval since last CT scan 11/03/2018 Numerous sclerotic bone lesions.  Stable 3 mm subpleural right middle lobe lung nodule stable.  Aortic atherosclerosis   11/28/2019 Procedure   Paracaval lymph node biopsy showed metastatic neoplasm,consistent with metastatic germ cell tumor, morphologically compatible with seminoma, and his case was reviewed on tumor board.  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 Appearance was consistent with primary testicular neoplasm. Normal size and appearance of the left testicle    01/17/2020 Surgery   S/p right orchiectomy  residual germ cell tumor and germ cell neoplasia in situ are not identified.  Case was discussed on tumor board.  Consensus reached a point although residual germ cell tumor/infection was not identified, the presence of scar with extensive tubular atrophy is compatible with a completely regressed germ cell tumor.   He went to [facility] for second opinion as I recommended. Due to long waiting time, he left without being seen. Dr.Rush from [facility] called and agrees with patient current treatment.    03/14/2020 Imaging   PET  . - small hypermetabolic lymph nodes along the RIGHT iliac vessels is most consistent with nodal metastasis.    04/12/2020 - 05/08/2020 Radiation Therapy   radiation to periaortic lymph nodes as well as right hemipelvis node.  12/19/2020 Imaging   CT chest abdomen pelvis showed interval resolution of retrocaval and right iliac lymphadenotomy. No new or progressive disease. Stable sclerotic lesions. Non obstructing bilateral nephrolithiasis.    06/24/2021 Tumor Marker   PSA 1.80    11/08/2021 Tumor Marker   PSA 2.9    11/14/2021 Imaging   CT chest abdomen pelvis w contrast  No new or progressive findings on today's examination.   Unchanged multiple small sclerotic lesions in the thoracic and lumbar spine as well as the pelvis. Stable 4 mm right middle lobe nodule. Recommend continued attention on follow-up. Aortic Atherosclerosis   12/17/2021 Tumor Marker   PSA 3.27   05/06/2022 Tumor Marker   PSA 5.75      ALLERGIES:  has No Known Allergies.  MEDICATIONS:  Current Outpatient Medications  Medication Sig Dispense Refill   clopidogrel (PLAVIX) 75 MG tablet Take 1 tablet by mouth once daily 90 tablet 3   docusate sodium (COLACE) 100 MG capsule Take 1 capsule (100 mg total) by mouth daily. 30 capsule 3   enzalutamide (XTANDI) 40 MG tablet Take 4 tablets (160 mg total) by mouth daily. (Patient not taking: Reported on 07/14/2022) 120 tablet 0   ezetimibe (ZETIA) 10 MG tablet Take 1 tablet by mouth once daily 90 tablet 0   ferrous sulfate 325 (65 FE) MG EC tablet Take 1 tablet (325 mg total) by mouth 2 (two) times daily with a meal. 60 tablet 3   furosemide (LASIX) 20 MG tablet May take 20 mg tablet as needed for increase shortness of breath or swelling 20 tablet 6   metoprolol succinate (TOPROL-XL) 50 MG 24 hr tablet Take 1 tablet (50 mg total) by mouth daily. Take with or immediately following a meal. 90 tablet 3   nitroGLYCERIN (NITROSTAT) 0.4 MG SL tablet Place 1 tablet (0.4 mg total) under the tongue every 5 (five) minutes as needed for chest pain. 25 tablet 3   rosuvastatin (CRESTOR) 40 MG tablet Take 1 tablet (40 mg total) by mouth daily. 90 tablet 3   sacubitril-valsartan (ENTRESTO) 97-103 MG Take 1 tablet by mouth 2 (two) times daily. 180 tablet 2   spironolactone (ALDACTONE) 25 MG tablet Take 1 tablet (25 mg total) by mouth daily. 90 tablet 3   tamsulosin (FLOMAX) 0.4 MG CAPS capsule Take 1 capsule (0.4 mg total) by mouth daily after supper. 90 capsule 3   traZODone (DESYREL) 50 MG tablet Take 0.5-1 tablets (25-50 mg total) by mouth at bedtime as needed for sleep. 30 tablet 6   venlafaxine XR (EFFEXOR XR) 37.5 MG 24 hr capsule Take 1 capsule (37.5 mg total) by mouth daily with breakfast. 30 capsule 1   VENTOLIN HFA 108 (90 Base) MCG/ACT inhaler INHALE 2 PUFFS BY MOUTH EVERY 6 HOURS AS NEEDED FOR WHEEZING OR SHORTNESS OF BREATH 18 g 0   No current facility-administered medications for this  visit.   Facility-Administered Medications Ordered in Other Visits  Medication Dose Route Frequency Provider Last Rate Last Admin   leuprolide (6 Month) (ELIGARD) injection 45 mg  45 mg Subcutaneous Once Rickard Patience, MD        VITAL SIGNS: There were no vitals taken for this visit. There were no vitals filed for this visit.  Estimated body mass index is 23.64 kg/m as calculated from the following:   Height as of 11/20/21: 6' (1.829 m).   Weight as of an earlier encounter on 07/14/22: 79.1 kg (174 lb 4.8 oz).  LABS: CBC:  Component Value Date/Time   WBC 4.3 07/14/2022 0822   WBC 4.2 05/06/2022 1100   HGB 9.9 (L) 07/14/2022 0822   HGB 10.9 (L) 05/10/2020 1043   HCT 31.0 (L) 07/14/2022 0822   HCT 32.5 (L) 05/10/2020 1043   PLT 317 07/14/2022 0822   PLT 306 05/10/2020 1043   MCV 78.9 (L) 07/14/2022 0822   MCV 74 (L) 05/10/2020 1043   NEUTROABS 2.0 07/14/2022 0822   NEUTROABS 4.1 11/22/2018 1345   LYMPHSABS 1.5 07/14/2022 0822   LYMPHSABS 2.3 11/22/2018 1345   MONOABS 0.7 07/14/2022 0822   EOSABS 0.1 07/14/2022 0822   EOSABS 0.1 11/22/2018 1345   BASOSABS 0.0 07/14/2022 0822   BASOSABS 0.1 11/22/2018 1345   Comprehensive Metabolic Panel:    Component Value Date/Time   NA 142 07/14/2022 0822   NA 136 05/10/2020 1043   K 3.9 07/14/2022 0822   CL 110 07/14/2022 0822   CO2 24 07/14/2022 0822   BUN 13 07/14/2022 0822   BUN 5 (L) 05/10/2020 1043   CREATININE 0.77 07/14/2022 0822   GLUCOSE 111 (H) 07/14/2022 0822   CALCIUM 9.3 07/14/2022 0822   AST 16 07/14/2022 0822   ALT 10 07/14/2022 0822   ALKPHOS 53 07/14/2022 0822   BILITOT 0.3 07/14/2022 0822   PROT 7.0 07/14/2022 0822   PROT 7.0 06/07/2019 1253   ALBUMIN 3.8 07/14/2022 0822   ALBUMIN 4.2 06/07/2019 1253     Present during today's visit: Patient only  Assessment and Plan: CMP/CBC reviewed, slight drop in hgb, will monitor MD order iron panel Continuation/dosing of enzalutamide will be based on the results of  currently pending labs (iron and PSA)   Oral Chemotherapy Side Effect/Intolerance:  No reported fatigue or s/sx of hypertention  Oral Chemotherapy Adherence: no missed doses reported (exception: pt currently out of medication, he needed to return to clinic prior to refill being authorized) No patient barriers to medication adherence identified.   New medications: no new medications reported  Medication Access Issues: Refill will be sent to [facility] pending labs and MD decision on dosing  Patient expressed understanding and was in agreement with this plan. He also understands that He can call clinic at any time with any questions, concerns, or complaints.   Follow-up plan: RTC in one month  Thank you for allowing me to participate in the care of this very pleasant patient.   Time Total: 10 mins  Visit consisted of counseling and education on dealing with issues of symptom management in the setting of serious and potentially life-threatening illness.Greater than 50%  of this time was spent counseling and coordinating care related to the above assessment and plan.  Signed by: Remi Haggard, PharmD, BCPS, Nolon Bussing, CPP Hematology/Oncology Clinical Pharmacist Practitioner [place]/DB/AP Oral Chemotherapy Navigation Clinic [phone]  07/14/2022 9:22 AM

## 2022-07-14 NOTE — Progress Notes (Signed)
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   06/24/2021, PSA 1.80. 11/08/2021, PSA 2.9 12/17/2021, PSA 3.27. 05/06/2021, PSA increased to 5.75.  Indicating castration resistant disease. PMSA showed no distant metastatic disease, except intesne acitivity in prostate. No RT per Dr. Lahoma Crocker 160mg  in end of March 2024.  He tolerates well and I recommend him to continue. .   Rationale and potential side effects were reviewed and discussed with patient.   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 Stable tumor markers.  No recurrence.  Will repeat tumor markers next visit.  Metastatic cancer to bone [facility]) PMSA showed no bone metastatic disease. Continue monitor.   IDA (iron deficiency anemia) Hemoglobin has significantly decreased, iron panel indicates iron deficiency.  Recommend oral iron supplementation- Ferrous sulfate 325mg  BID  Androgen deprivation therapy Eligard 45mg  Q6 months.   he is overdue, will arrange.   Orders Placed This Encounter  Procedures   Iron and TIBC    Standing Status:   Future    Number of Occurrences:   1    Standing Expiration Date:   07/14/2023   Ferritin    Standing Status:   Future    Number of Occurrences:   1    Standing Expiration Date:   07/14/2023   Retic Panel    Standing Status:   Future    Number of Occurrences:   1    Standing Expiration Date:   07/14/2023   CMP (Cancer Center only)    Standing Status:   Future     Standing Expiration Date:   07/14/2023   CBC with Differential (Cancer Center Only)    Standing Status:   Future    Standing Expiration Date:   07/14/2023   PSA    Standing Status:   Future    Standing Expiration Date:   07/14/2023   Follow up in 1 month.  All questions were answered. The patient knows to call the clinic with any problems, questions or concerns.  Rickard Patience, MD, PhD [facility] Hematology Oncology 07/14/2022   HISTORY OF PRESENTING ILLNESS:  Oncology History  Prostate cancer [facility])  03/10/2018 Initial Diagnosis   Prostate cancer [facility])  Patient was diagnosed with presumed prostate cancer on 10/2018 with a PSA of 509, incidental findings of multiple sclerotic lesions on CT-chest abdomen pelvis during work-up for a CVA.  His previous urology care was with Dr. Ronne Binning. 11/05/2018 bone scan showed solitary punctate focus of activity in the left first sacral segment corresponding to a 9 mm mixed lytic and sclerotic metastatic's on recent CT.  The remaining numerous sclerotic osseous metastasis identified on CT do not demonstrate abnormal activity and are therefore healed.  At that time No tissue diagnosis/biopsy was obtained. Patient was started with Lupron and Casodex Lupron was eventually to Eligard Patient was [facility] on 09/[facility], PSA was 13.13, testosterone level less than 10. Casodex was discontinued in June 2021 10/26/2019 patient switched to [facility] urology and was seen by Dr. Lonna Cobb Per urology  note, PSA trend: 11/03/2018: 502 11/22/2018: 377 02/21/2019: 387 04/25/2019: 123 09/05/2019: 13.3 Patient was continued on Eligard 45mg  Q6 months.    10/06/2019 Imaging   bone scan showed resolution of punctated activity over the left sacrum. The punctate area of increased activity over the right lower lumbar spine again noted.    10/31/2019 Imaging   CT angio chest aorta showed no evidence of aortic aneurysm or dissection.  Atherosclerotic calcification  spleen seen in the upper abdominal aorta.  Chronic artery disease.  No acute cardiopulmonary disease. Patient has a chronic right neck mass.  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    11/18/2019 Imaging   11/18/2019 CT abdomen pelvis with contrast showed retrocaval lymphadenopathy in the right common and external iliac chains.  Stable to minimally progressed in the interval since last CT scan 11/03/2018 Numerous sclerotic bone lesions.  Stable 3 mm subpleural right middle lobe lung nodule stable.  Aortic atherosclerosis   11/28/2019 Procedure   Paracaval lymph node biopsy showed metastatic neoplasm,consistent with metastatic germ cell tumor, morphologically compatible with seminoma, and his case was reviewed on tumor board.  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 Appearance was consistent with primary testicular neoplasm. Normal size and appearance of the left testicle    01/17/2020 Surgery   S/p right orchiectomy  residual germ cell tumor and germ cell neoplasia in situ are not identified.  Case was discussed on tumor board.  Consensus reached a point although residual germ cell tumor/infection was not identified, the presence of scar with extensive tubular atrophy is compatible with a completely regressed germ cell tumor.   He went to [facility] for second opinion as I recommended. Due to long waiting time, he left without being seen. Dr.Rush from [facility] called and agrees with patient current treatment.    03/14/2020 Imaging    PET  . - small hypermetabolic lymph nodes along the RIGHT iliac vessels is most consistent with nodal metastasis.    04/12/2020 - 05/08/2020 Radiation Therapy   radiation to periaortic lymph nodes as well as right hemipelvis node.    12/19/2020 Imaging   CT chest abdomen pelvis showed interval resolution of retrocaval and right iliac lymphadenotomy. No new or progressive disease. Stable sclerotic lesions. Non obstructing bilateral nephrolithiasis.    06/24/2021 Tumor Marker   PSA 1.80    11/08/2021 Tumor Marker   PSA 2.9    11/14/2021 Imaging   CT chest abdomen pelvis w contrast  No new or progressive findings on today's examination.   Unchanged multiple small sclerotic lesions in the thoracic and lumbar spine as well as the pelvis. Stable 4 mm right middle lobe nodule. Recommend continued attention on follow-up. Aortic Atherosclerosis   12/17/2021 Tumor Marker   PSA 3.27   05/06/2022 Tumor Marker   PSA 5.75   05/29/2022 Imaging   PMSA PET showed 1. Intense radiotracer activity within the prostate gland consistent with primary prostate adenocarcinoma. 2. Resolution of RIGHT iliac adenopathy. 3. No evidence of metastatic adenopathy in the pelvis  or periaortic retroperitoneum. 4. No evidence of visceral metastasis or skeletal metastasis. 5. Bilobed hypodense mass in the RIGHT neck is stable over multiple comparison exams including FDG PET scan and CT neck 11/03/2018   05/2022 -  Chemotherapy   Started on Xtandi 160mg  daily.    07/14/2022 Tumor Marker   PSA is pending      Patient has extensive cardiology issues.  He follows up with Dr. Herbie Baltimore. Patient has history of STEMI-PCI to LAD with resultant ischemia cardiomyopathy, EF 35 to 40%, left bundle branch block with acute CVA-10/2018.  CT coronary showed nonobstructing plaquing of coronary distributions, intimal irregularity with filling defect in the ascending aorta but no LV thrombus.  And the patient is on warfarin and Plavix.   Patient was seen by thoracic surgeon Dr. Cornelius Moras will recommend patient to be on Coumadin for minimum of 3 months.  With presentation with stroke, Dr. Herbie Baltimore recommend at least 6 to 12 months of Coumadin. -Coumadin was discontinued by Dr. Herbie Baltimore in September 2021 and switched to.  Plavix Patient lives with his sister.  He has 3 adult children.  His activity is quite limited due to shortness of breath with exertion due to cardiology problems.  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  Patient has an ENT evaluation.  Status post biopsy- WARTHIN TUMOR.  Negative for atypia and malignancy. # He has had CRT-D implant.  # 12/19/2020 CT chest abdomen pelvis showed interval resolution of retrocaval and right iliac lymphadenotomy. No new or progressive disease. Stable sclerotic lesions. Non obstructing bilateral nephrolithiasis.  #06/14/2021, CT neck soft tissue with contrast showed multiple bilateral parotid masses, largest lesion on the right has increased the size, now 5.1 x 2.9 x 4.9 cm.  Previously 4.1 cm in 2016.  INTERVAL HISTORY Jahari Hansford is a 62 y.o. male who has above history reviewed by me today presents for follow up visit for management of metastatic prostate cancer and germ cell tumor.  He has no new complaints today.  He started on Xtandi at the end of March and supposed to follow up a few weeks for tolerata check. He rescheduled appt due to family reason.  He reports doing well,. Tolerating treatment well. No new complaints.   Review of Systems  Constitutional:  Positive for fatigue. Negative for appetite change, chills, fever and unexpected weight change.  HENT:   Negative for hearing loss and voice change.        Chronic neck mass  Eyes:  Negative for eye problems and icterus.  Respiratory:  Positive for shortness of breath. Negative for chest tightness and cough.   Cardiovascular:  Negative for chest pain and leg swelling.  Gastrointestinal:  Negative for  abdominal distention and abdominal pain.  Endocrine: Positive for hot flashes.  Genitourinary:  Negative for difficulty urinating, dysuria and frequency.   Musculoskeletal:  Negative for arthralgias.  Skin:  Negative for itching and rash.  Neurological:  Negative for dizziness, light-headedness and numbness.  Hematological:  Negative for adenopathy. Does not bruise/bleed easily.  Psychiatric/Behavioral:  Negative for confusion.     MEDICAL HISTORY:  Past Medical History:  Diagnosis Date   Abnormal chest CT 02/2018   a. Ectatic 3 cm Ao arch - rec f/u outpt imaging w/ CTA or MRA. Ectatic atheromatous abd Ao @ risk for aneurysm - rec f/u u/s in 5 yrs. Marked prostatic enlargement w/ scattered small sclerotic foci in the thoracic lower lumbar spine, sacrum, left 12th rib, pelvis, and right femur.  Recommend elective outpatient whole-body bone scintigraphy and PSA.   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 02/2018 ACS/PCI: LM nl, LAD 85p (3.5x15 Sierra DES), D1 45ost, RI 55ost, RCA nl, EF 25-35%.   Cardiac murmur    Chest pain on exertion 11/28/2019   Chronic combined systolic and diastolic CHF, NYHA class 2 (HCC) 04/17/2020   Complete left bundle branch block (LBBB) 2016   Coronary artery disease involving native coronary artery of native heart with angina pectoris (HCC) 03/10/2018   Dyspnea    Encephalopathy, hypertensive    Essential hypertension 07/07/2014   Germ cell tumor (HCC) 12/22/2019   Goals of care, counseling/discussion 12/22/2019   Hematemesis without nausea    HFrEF (heart failure with reduced ejection fraction) (HCC)    History of CVA (cerebrovascular accident) 11/23/2018   History of non-ST elevation myocardial infarction  (NSTEMI) 02/21/2019   Hypertension    Hypokalemia    Hyponatremia 06/29/2015   Ischemic cardiomyopathy 02/2018   a. 02/2018 Echo: EF 35-40% (LV gram on cath was 25-30%), mod, diff HK. mid-apicalanteroseptal, ant, and apical sev HK. RVH.-->  No notable change on echo from June 2020 and August 2020.   Localized swelling, mass or lump of neck    Long term (current) use of anticoagulants 11/16/2018   Malnutrition of moderate degree 11/11/2018   Mass of right side of neck 07/07/2014   Myocardial infarction [facility])    Neck mass    Right Neck mass - US neck showed 6.3 cm complex, partially cystic, partially solid mass.  CT neck showed 2 cm mass from parotid gland on right, with 3x4x5cm  Possible metastatic lymphadenopathy.  we recommended following up with Dr. Jenne Pane ENT for biopsy of this.      Non-ST elevation (NSTEMI) myocardial infarction (HCC) 03/06/2018   NSTEMI (non-ST elevated myocardial infarction) (HCC) 02/2018   85% pLAD - DES PCI    Paresthesia 07/07/2014   Prostate cancer (HCC) 03/10/2018   Prostate cancer metastatic to multiple sites [facility]) 11/2018   Right sided weakness 07/07/2014   Stroke [facility])    TIA (transient ischemic attack) 2016   Warthin's tumor     SURGICAL HISTORY: Past Surgical History:  Procedure Laterality Date   BIV ICD INSERTION CRT-D N/A 06/06/2020   Procedure: BIV ICD INSERTION CRT-D;  Surgeon: Regan Lemming, MD;  Location: [facility] INVASIVE CV LAB;  Service: Cardiovascular;  Laterality: N/A;   COLONOSCOPY WITH PROPOFOL N/A 02/27/2020   Procedure: COLONOSCOPY WITH PROPOFOL;  Surgeon: Benancio Deeds, MD;  Location: [facility] ENDOSCOPY;  Service: Gastroenterology;  Laterality: N/A;   CORONARY STENT INTERVENTION N/A 03/06/2018   Procedure: CORONARY STENT INTERVENTION;  Surgeon: Marykay Lex, MD;  Location: [facility] INVASIVE CV LAB;  Service: Cardiovascular: Proximal LAD 85% stenosis (and D1): DES PCI with Xience Sierra DES 3.5 mm x 15 mm - 3.9 mm   ESOPHAGOGASTRODUODENOSCOPY  (EGD) WITH PROPOFOL N/A 02/27/2020   Procedure: ESOPHAGOGASTRODUODENOSCOPY (EGD) WITH PROPOFOL;  Surgeon: Benancio Deeds, MD;  Location: [facility] ENDOSCOPY;  Service: Gastroenterology;  Laterality: N/A;   HEMOSTASIS CLIP PLACEMENT  02/27/2020   Procedure: HEMOSTASIS CLIP PLACEMENT;  Surgeon: Benancio Deeds, MD;  Location: [facility] ENDOSCOPY;  Service: Gastroenterology;;  HOT HEMOSTASIS N/A 02/27/2020   Procedure: HOT HEMOSTASIS (ARGON PLASMA COAGULATION/BICAP);  Surgeon: Benancio Deeds, MD;  Location: [facility] ENDOSCOPY;  Service: Gastroenterology;  Laterality: N/A;   INTRAOPERATIVE TRANSTHORACIC ECHOCARDIOGRAM  03/06/2018   (Peri-MI) mild reduced EF 35-40%.  Diffuse hypokinesis (severe HK of the mid-apical anteroseptal, anterior and apical myocardium consistent with LAD infarct).  Unable to assess diastolic function.  Paradoxical septal motion likely related to his LBBB.  RV hypertrophy noted.  Trivial pericardial effusion noted.    LEFT HEART CATH AND CORONARY ANGIOGRAPHY N/A 03/06/2018   Procedure: LEFT HEART CATH AND CORONARY ANGIOGRAPHY;  Surgeon: Marykay Lex, MD;  Location: [facility] INVASIVE CV LAB;  Service: Cardiovascular: Proximal LAD 85% involving D1 45%.  Ostial RI 55%.  Severe LV dysfunction EF 25 to 30%.  1+ MR.  Only minimally elevated LVEDP.   ORCHIECTOMY Right 01/17/2020   Procedure: ORCHIECTOMY;  Surgeon: Riki Altes, MD;  Location: [facility] ORS;  Service: Urology;  Laterality: Right;   POLYPECTOMY  02/27/2020   Procedure: POLYPECTOMY;  Surgeon: Benancio Deeds, MD;  Location: [facility] ENDOSCOPY;  Service: Gastroenterology;;   PROSTATE BIOPSY N/A 01/17/2020   Procedure: BIOPSY TRANSRECTAL ULTRASONIC PROSTATE (TUBP);  Surgeon: Riki Altes, MD;  Location: [facility] ORS;  Service: Urology;  Laterality: N/A;   TRANSTHORACIC ECHOCARDIOGRAM  10/2018   (Most recent echo, to evaluate for TIA/CVA) : Moderate reduced EF 35 to 40%.  Moderate concentric LVH.  Mild dyskinesis of the apex and  severe hypokinesis of mid apical anteroseptal and anterior wall.  Unable to assess RV pressures.  Aortic sclerosis with no stenosis.  No significant change seen    SOCIAL HISTORY: Social History   Socioeconomic History   Marital status: Legally Separated    Spouse name: Dorothenia   Number of children: Not on file   Years of education: Not on file   Highest education level: Not on file  Occupational History   Occupation: cook  Tobacco Use   Smoking status: Some Days    Packs/day: 0.00    Years: 0.00    Additional pack years: 0.00    Total pack years: 0.00    Types: Cigarettes   Smokeless tobacco: Never   Tobacco comments:    quit three days ago  Vaping Use   Vaping Use: Never used  Substance and Sexual Activity   Alcohol use: Yes    Alcohol/week: 2.0 standard drinks of alcohol    Types: 2 Cans of beer per week    Comment: 2 Beers daily.   Drug use: Yes    Frequency: 3.0 times per week    Types: Marijuana   Sexual activity: Not Currently  Other Topics Concern   Not on file  Social History Narrative   Currently not working.  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  Now has cancer.   Social Determinants of Health   Financial Resource Strain: Not on file  Food Insecurity: Not on file  Transportation Needs: No Transportation Needs (11/22/2018)   PRAPARE - Administrator, Civil Service (Medical): No    Lack of Transportation (Non-Medical): No  Physical Activity: Not on file  Stress: Not on file  Social Connections: Not on file  Intimate Partner Violence: Not on file    FAMILY HISTORY: Family History  Problem Relation Age of Onset   Stroke Mother    Hypertension Mother    Diabetes type II Mother  Leukemia Other    Breast cancer Other    Stroke Brother    CAD Neg Hx     ALLERGIES:  has No Known Allergies.  MEDICATIONS:  Current Outpatient Medications  Medication Sig Dispense Refill   clopidogrel (PLAVIX) 75 MG tablet  Take 1 tablet by mouth once daily 90 tablet 3   docusate sodium (COLACE) 100 MG capsule Take 1 capsule (100 mg total) by mouth daily. 30 capsule 3   ezetimibe (ZETIA) 10 MG tablet Take 1 tablet by mouth once daily 90 tablet 0   furosemide (LASIX) 20 MG tablet May take 20 mg tablet as needed for increase shortness of breath or swelling 20 tablet 6   metoprolol succinate (TOPROL-XL) 50 MG 24 hr tablet Take 1 tablet (50 mg total) by mouth daily. Take with or immediately following a meal. 90 tablet 3   nitroGLYCERIN (NITROSTAT) 0.4 MG SL tablet Place 1 tablet (0.4 mg total) under the tongue every 5 (five) minutes as needed for chest pain. 25 tablet 3   rosuvastatin (CRESTOR) 40 MG tablet Take 1 tablet (40 mg total) by mouth daily. 90 tablet 3   sacubitril-valsartan (ENTRESTO) 97-103 MG Take 1 tablet by mouth 2 (two) times daily. 180 tablet 2   spironolactone (ALDACTONE) 25 MG tablet Take 1 tablet (25 mg total) by mouth daily. 90 tablet 3   tamsulosin (FLOMAX) 0.4 MG CAPS capsule Take 1 capsule (0.4 mg total) by mouth daily after supper. 90 capsule 3   traZODone (DESYREL) 50 MG tablet Take 0.5-1 tablets (25-50 mg total) by mouth at bedtime as needed for sleep. 30 tablet 6   venlafaxine XR (EFFEXOR XR) 37.5 MG 24 hr capsule Take 1 capsule (37.5 mg total) by mouth daily with breakfast. 30 capsule 1   VENTOLIN HFA 108 (90 Base) MCG/ACT inhaler INHALE 2 PUFFS BY MOUTH EVERY 6 HOURS AS NEEDED FOR WHEEZING OR SHORTNESS OF BREATH 18 g 0   enzalutamide (XTANDI) 40 MG tablet Take 4 tablets (160 mg total) by mouth daily. 120 tablet 0   ferrous sulfate 325 (65 FE) MG EC tablet Take 1 tablet (325 mg total) by mouth 2 (two) times daily with a meal. 60 tablet 3   No current facility-administered medications for this visit.   Facility-Administered Medications Ordered in Other Visits  Medication Dose Route Frequency Provider Last Rate Last Admin   leuprolide (6 Month) (ELIGARD) injection 45 mg  45 mg Subcutaneous  Once Rickard Patience, MD         PHYSICAL EXAMINATION: ECOG PERFORMANCE STATUS: 1 - Symptomatic but completely ambulatory Vitals:   07/14/22 0831  BP: 124/79  Pulse: 71  Resp: 20  Temp: 97.8 F (36.6 C)  SpO2: 100%   Filed Weights   07/14/22 0831  Weight: 174 lb 4.8 oz (79.1 kg)    Physical Exam Constitutional:      General: He is not in acute distress.    Comments: Thin built.   HENT:     Head: Normocephalic and atraumatic.  Eyes:     General: No scleral icterus. Neck:     Comments: Palpable right cervical mass Cardiovascular:     Rate and Rhythm: Normal rate.  Pulmonary:     Effort: Pulmonary effort is normal. No respiratory distress.  Abdominal:     General: Bowel sounds are normal. There is no distension.     Palpations: Abdomen is soft.  Musculoskeletal:        General: No deformity. Normal range of motion.  Cervical back: Normal range of motion and neck supple.  Skin:    General: Skin is warm and dry.     Findings: No erythema or rash.  Neurological:     Mental Status: He is alert and oriented to person, place, and time. Mental status is at baseline.     Cranial Nerves: No cranial nerve deficit.  Psychiatric:        Mood and Affect: Mood normal.     LABORATORY DATA:  I have reviewed the data as listed    Latest Ref Rng & Units 07/14/2022    8:22 AM 05/06/2022   11:00 AM 12/17/2021   11:16 AM  CBC  WBC 4.0 - 10.5 K/uL 4.3  4.2  4.3   Hemoglobin 13.0 - 17.0 g/dL 9.9  82.9  56.2   Hematocrit 39.0 - 52.0 % 31.0  37.8  38.5   Platelets 150 - 400 K/uL 317  405  403       Latest Ref Rng & Units 07/14/2022    8:22 AM 05/06/2022   11:00 AM 12/17/2021   11:16 AM  CMP  Glucose 70 - 99 mg/dL 130  865  92   BUN 8 - 23 mg/dL 13  13  10    Creatinine 0.61 - 1.24 mg/dL 7.84  6.96  2.95   Sodium 135 - 145 mmol/L 142  138  136   Potassium 3.5 - 5.1 mmol/L 3.9  3.5  3.4   Chloride 98 - 111 mmol/L 110  105  103   CO2 22 - 32 mmol/L 24  25  24    Calcium 8.9 - 10.3  mg/dL 9.3  9.8  9.5   Total Protein 6.5 - 8.1 g/dL 7.0  8.5  8.3   Total Bilirubin 0.3 - 1.2 mg/dL 0.3  0.6  1.0   Alkaline Phos 38 - 126 U/L 53  68  91   AST 15 - 41 U/L 16  22  22    ALT 0 - 44 U/L 10  12  16       RADIOGRAPHIC STUDIES: I have personally reviewed the radiological images as listed and agreed with the findings in the report. No results found.

## 2022-07-14 NOTE — Telephone Encounter (Signed)
Called pt and informed him of MD recommendation. Pt verbalized understanding and states that he hasn't been taking iron as he should. Advised hi to take as directed.

## 2022-07-14 NOTE — Assessment & Plan Note (Signed)
#  Germ cell tumor, stage II, status post radiation. Stable tumor markers.  No recurrence.  Will repeat tumor markers next visit. 

## 2022-07-14 NOTE — Assessment & Plan Note (Signed)
Eligard 45mg  Q6 months.   he is overdue, will arrange.

## 2022-07-14 NOTE — Telephone Encounter (Signed)
-----   Message from Rickard Patience, MD sent at 07/14/2022 12:30 PM EDT ----- Looks like he is also overdue for Eligard.  Please arrange him to get Eligard injeciton ASAP Thanks.

## 2022-07-14 NOTE — Assessment & Plan Note (Addendum)
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   06/24/2021, PSA 1.80. 11/08/2021, PSA 2.9 12/17/2021, PSA 3.27. 05/06/2021, PSA increased to 5.75.  Indicating castration resistant disease. PMSA showed no distant metastatic disease, except intesne acitivity in prostate. No RT per Dr. Lahoma Crocker 160mg  in end of March 2024.  He tolerates well and I recommend him to continue. .   Rationale and potential side effects were reviewed and discussed with patient.

## 2022-07-21 ENCOUNTER — Inpatient Hospital Stay: Payer: Medicare Other

## 2022-07-22 ENCOUNTER — Other Ambulatory Visit: Payer: Self-pay

## 2022-07-22 ENCOUNTER — Inpatient Hospital Stay: Payer: Medicare Other

## 2022-07-22 ENCOUNTER — Other Ambulatory Visit (HOSPITAL_COMMUNITY): Payer: Self-pay

## 2022-07-23 ENCOUNTER — Other Ambulatory Visit (HOSPITAL_COMMUNITY): Payer: Self-pay

## 2022-07-24 ENCOUNTER — Ambulatory Visit: Payer: Medicare Other | Admitting: Pharmacist

## 2022-07-24 ENCOUNTER — Other Ambulatory Visit: Payer: Medicare Other

## 2022-07-24 ENCOUNTER — Ambulatory Visit: Payer: Medicare Other | Admitting: Oncology

## 2022-07-30 ENCOUNTER — Inpatient Hospital Stay: Payer: Medicare Other

## 2022-07-30 DIAGNOSIS — C61 Malignant neoplasm of prostate: Secondary | ICD-10-CM | POA: Diagnosis not present

## 2022-07-30 MED ORDER — LEUPROLIDE ACETATE (6 MONTH) 45 MG ~~LOC~~ KIT
45.0000 mg | PACK | Freq: Once | SUBCUTANEOUS | Status: AC
  Administered 2022-07-30: 45 mg via SUBCUTANEOUS
  Filled 2022-07-30: qty 45

## 2022-08-12 ENCOUNTER — Other Ambulatory Visit (HOSPITAL_COMMUNITY): Payer: Self-pay

## 2022-08-15 ENCOUNTER — Other Ambulatory Visit (HOSPITAL_COMMUNITY): Payer: Self-pay

## 2022-08-18 ENCOUNTER — Inpatient Hospital Stay: Payer: Medicare Other | Admitting: Oncology

## 2022-08-18 ENCOUNTER — Other Ambulatory Visit: Payer: Self-pay | Admitting: Oncology

## 2022-08-18 ENCOUNTER — Other Ambulatory Visit (HOSPITAL_COMMUNITY): Payer: Self-pay

## 2022-08-18 ENCOUNTER — Inpatient Hospital Stay: Payer: Medicare Other

## 2022-08-18 DIAGNOSIS — C61 Malignant neoplasm of prostate: Secondary | ICD-10-CM

## 2022-08-18 NOTE — Telephone Encounter (Signed)
CBC with Differential (Cancer Center Only) Order: [id number] Status: Final result     Visible to patient: No (inaccessible in MyChart)     Next appt: 08/29/2022 at 10:30 AM in Oncology ([facility])     Dx: Prostate cancer (HCC)   2 Result Notes     1 Follow-up Encounter          Component Ref Range & Units 1 mo ago (07/14/22) 3 mo ago (05/06/22) 8 mo ago (12/17/21) 9 mo ago (11/20/21) 9 mo ago (11/04/21) 1 yr ago (06/24/21) 1 yr ago (03/25/21)  WBC Count 4.0 - 10.5 K/uL 4.3 4.2 4.3 5.7 6.4 5.6 4.2  RBC 4.22 - 5.81 MIL/uL 3.93 Low  4.68 5.11 5.44 4.53 5.18 3.86 Low   Hemoglobin 13.0 - 17.0 g/dL 9.9 Low  78.2 Low  95.6 Low  13.4 11.0 Low  12.4 Low  9.5 Low  CM  Comment: Reticulocyte Hemoglobin testing may be clinically indicated, consider ordering this additional test [id number]  HCT 39.0 - 52.0 % 31.0 Low  37.8 Low  38.5 Low  41.4 34.6 Low  38.3 Low  29.2 Low   MCV 80.0 - 100.0 fL 78.9 Low  80.8 75.3 Low  76.1 Low  76.4 Low  73.9 Low  75.6 Low   MCH 26.0 - 34.0 pg 25.2 Low  25.4 Low  25.0 Low  24.6 Low  24.3 Low  23.9 Low  24.6 Low   MCHC 30.0 - 36.0 g/dL 84.6 96.2 95.2 84.1 32.4 32.4 32.5  RDW 11.5 - 15.5 % 18.6 High  19.6 High  18.8 High  19.9 High  19.5 High  21.2 High  18.1 High   Platelet Count 150 - 400 K/uL 317 405 High  403 High  376 342 431 High  353  nRBC 0.0 - 0.2 % 0.0 0.0 0.0 0.0 0.0 0.0 0.0  Neutrophils Relative % % 46 50 47 45 62 60 57  Neutro Abs 1.7 - 7.7 K/uL 2.0 2.1 2.0 2.6 4.0 3.4 2.4  Lymphocytes Relative % 35 36 39 42 24 27 27   Lymphs Abs 0.7 - 4.0 K/uL 1.5 1.5 1.7 2.4 1.5 1.5 1.1  Monocytes Relative % 16 12 12 12 12 12 14   Monocytes Absolute 0.1 - 1.0 K/uL 0.7 0.5 0.5 0.7 0.8 0.7 0.6  Eosinophils Relative % 2 1 1  0 1 0 1  Eosinophils Absolute 0.0 - 0.5 K/uL 0.1 0.0 0.0 0.0 0.0 0.0 0.0  Basophils Relative % 1 1 1 1 1 1 1   Basophils Absolute 0.0 - 0.1 K/uL 0.0 0.0 0.0 0.0 0.0 0.0 0.0  Immature Granulocytes % 0 0 0 0 0 0 0  Abs Immature  Granulocytes 0.00 - 0.07 K/uL 0.01 0.01 CM 0.01 CM 0.01 CM 0.02 CM 0.02 CM 0.01 CM  Comment: Performed at [facility], [address] [place], [place] [address]  Resulting Agency [facility]         Specimen Collected: 07/14/22 08:22 Last Resulted: 07/14/22 08:31      Lab Flowsheet      Order Details      View Encounter      Lab and Collection Details      Routing      Result History    View All Conversations on this Encounter      CM=Additional comments      Result Care Coordination  Result Notes   Coralee Rud, RN 07/14/2022 11:32 AM EDT Back to Top    Ph note created   Rickard Patience, MD 07/14/2022 10:29 AM EDT     He is iron deficient. Recommend him to take iron supplementation. Sent to pharmacy. I will refill Xtandi too, same dosage.   Patient Communication   Add Comments   Not seen Back to Top     Follow-up Encounters  07/14/2022 Telephone Back to Top      Other Results from 07/14/2022   Contains abnormal data CMP (Cancer Center only) Order: [id number] Status: Final result      Visible to patient: No (inaccessible in MyChart)      Next appt: 08/29/2022 at 10:30 AM in Oncology ([facility])      Dx: Prostate cancer (HCC)    2 Result Notes      1 Follow-up Encounter            Component Ref Range & Units 1 mo ago (07/14/22) 3 mo ago (05/06/22) 8 mo ago (12/17/21) 9 mo ago (11/20/21) 9 mo ago (11/20/21) 9 mo ago (11/04/21) 1 yr ago (06/24/21)  Sodium 135 - 145 mmol/L 142 138 136  137 136 134 Low   Potassium 3.5 - 5.1 mmol/L 3.9 3.5 3.4 Low   3.5 4.1 4.0  Chloride 98 - 111 mmol/L 110 105 103  102 104 103  CO2 22 - 32 mmol/L 24 25 24  21  Low  24 22  Glucose, Bld 70 - 99 mg/dL 119 High  147 High  CM 92 CM  119 High  CM 104 High  CM 106 High  CM  Comment: Glucose reference range applies only to samples taken after fasting for at least 8 hours.  BUN 8 - 23  mg/dL 13 13 10  12 17 12   Creatinine 0.61 - 1.24 mg/dL 8.29 5.62 1.30  8.65 7.84 0.82  Calcium 8.9 - 10.3 mg/dL 9.3 9.8 9.5  69.6 9.2 9.8  Total Protein 6.5 - 8.1 g/dL 7.0 8.5 High  8.3 High  9.4 High   7.7 8.2 High   Albumin 3.5 - 5.0 g/dL 3.8 4.4 4.3 4.6  3.9 4.0  AST 15 - 41 U/L 16 22 22 31  20 23   ALT 0 - 44 U/L 10 12 16 14  12 15   Alkaline Phosphatase 38 - 126 U/L 53 68 91 103  85 88  Total Bilirubin 0.3 - 1.2 mg/dL 0.3 0.6 1.0 0.9  0.3 0.3  GFR, Estimated >60 mL/min >60        Comment: (NOTE) Calculated using the CKD-EPI Creatinine Equation (2021)  Anion gap 5 - 15 8 8  CM 9 CM  14 CM 8 CM 9 CM  Comment: Performed at [facility], [address] [place], [place] [address]  Resulting Agency [facility]         Specimen Collected: 07/14/22 08:22 Last Resulted: 07/14/22 08:52

## 2022-08-19 ENCOUNTER — Encounter: Payer: Self-pay | Admitting: Oncology

## 2022-08-19 ENCOUNTER — Other Ambulatory Visit (HOSPITAL_COMMUNITY): Payer: Self-pay

## 2022-08-19 MED ORDER — ENZALUTAMIDE 40 MG PO TABS
160.0000 mg | ORAL_TABLET | Freq: Every day | ORAL | 0 refills | Status: DC
Start: 2022-08-19 — End: 2022-08-29
  Filled 2022-08-19 (×2): qty 120, 30d supply, fill #0

## 2022-08-20 ENCOUNTER — Other Ambulatory Visit: Payer: Self-pay

## 2022-08-29 ENCOUNTER — Other Ambulatory Visit: Payer: Self-pay

## 2022-08-29 ENCOUNTER — Encounter: Payer: Self-pay | Admitting: Oncology

## 2022-08-29 ENCOUNTER — Inpatient Hospital Stay (HOSPITAL_BASED_OUTPATIENT_CLINIC_OR_DEPARTMENT_OTHER): Payer: Medicare Other | Admitting: Oncology

## 2022-08-29 ENCOUNTER — Inpatient Hospital Stay: Payer: Medicare Other | Attending: Oncology

## 2022-08-29 ENCOUNTER — Other Ambulatory Visit (HOSPITAL_COMMUNITY): Payer: Self-pay

## 2022-08-29 VITALS — BP 197/93 | HR 85 | Temp 96.5°F | Ht 72.0 in | Wt 171.5 lb

## 2022-08-29 DIAGNOSIS — R9721 Rising PSA following treatment for malignant neoplasm of prostate: Secondary | ICD-10-CM | POA: Insufficient documentation

## 2022-08-29 DIAGNOSIS — I110 Hypertensive heart disease with heart failure: Secondary | ICD-10-CM | POA: Diagnosis not present

## 2022-08-29 DIAGNOSIS — D508 Other iron deficiency anemias: Secondary | ICD-10-CM

## 2022-08-29 DIAGNOSIS — C801 Malignant (primary) neoplasm, unspecified: Secondary | ICD-10-CM

## 2022-08-29 DIAGNOSIS — C61 Malignant neoplasm of prostate: Secondary | ICD-10-CM

## 2022-08-29 DIAGNOSIS — I255 Ischemic cardiomyopathy: Secondary | ICD-10-CM | POA: Insufficient documentation

## 2022-08-29 DIAGNOSIS — Z79899 Other long term (current) drug therapy: Secondary | ICD-10-CM | POA: Insufficient documentation

## 2022-08-29 DIAGNOSIS — C6211 Malignant neoplasm of descended right testis: Secondary | ICD-10-CM

## 2022-08-29 DIAGNOSIS — R978 Other abnormal tumor markers: Secondary | ICD-10-CM

## 2022-08-29 DIAGNOSIS — Z923 Personal history of irradiation: Secondary | ICD-10-CM | POA: Insufficient documentation

## 2022-08-29 DIAGNOSIS — D509 Iron deficiency anemia, unspecified: Secondary | ICD-10-CM | POA: Diagnosis not present

## 2022-08-29 DIAGNOSIS — C7951 Secondary malignant neoplasm of bone: Secondary | ICD-10-CM | POA: Diagnosis not present

## 2022-08-29 DIAGNOSIS — F17210 Nicotine dependence, cigarettes, uncomplicated: Secondary | ICD-10-CM | POA: Insufficient documentation

## 2022-08-29 DIAGNOSIS — I252 Old myocardial infarction: Secondary | ICD-10-CM | POA: Insufficient documentation

## 2022-08-29 DIAGNOSIS — Z8547 Personal history of malignant neoplasm of testis: Secondary | ICD-10-CM | POA: Diagnosis not present

## 2022-08-29 DIAGNOSIS — N400 Enlarged prostate without lower urinary tract symptoms: Secondary | ICD-10-CM | POA: Insufficient documentation

## 2022-08-29 DIAGNOSIS — I2510 Atherosclerotic heart disease of native coronary artery without angina pectoris: Secondary | ICD-10-CM | POA: Insufficient documentation

## 2022-08-29 DIAGNOSIS — Z79818 Long term (current) use of other agents affecting estrogen receptors and estrogen levels: Secondary | ICD-10-CM

## 2022-08-29 LAB — CBC WITH DIFFERENTIAL (CANCER CENTER ONLY)
Abs Immature Granulocytes: 0.01 10*3/uL (ref 0.00–0.07)
Basophils Absolute: 0 10*3/uL (ref 0.0–0.1)
Basophils Relative: 1 %
Eosinophils Absolute: 0 10*3/uL (ref 0.0–0.5)
Eosinophils Relative: 0 %
HCT: 33.9 % — ABNORMAL LOW (ref 39.0–52.0)
Hemoglobin: 11 g/dL — ABNORMAL LOW (ref 13.0–17.0)
Immature Granulocytes: 0 %
Lymphocytes Relative: 32 %
Lymphs Abs: 1.4 10*3/uL (ref 0.7–4.0)
MCH: 25.1 pg — ABNORMAL LOW (ref 26.0–34.0)
MCHC: 32.4 g/dL (ref 30.0–36.0)
MCV: 77.4 fL — ABNORMAL LOW (ref 80.0–100.0)
Monocytes Absolute: 0.5 10*3/uL (ref 0.1–1.0)
Monocytes Relative: 11 %
Neutro Abs: 2.6 10*3/uL (ref 1.7–7.7)
Neutrophils Relative %: 56 %
Platelet Count: 325 10*3/uL (ref 150–400)
RBC: 4.38 MIL/uL (ref 4.22–5.81)
RDW: 19.6 % — ABNORMAL HIGH (ref 11.5–15.5)
WBC Count: 4.6 10*3/uL (ref 4.0–10.5)
nRBC: 0 % (ref 0.0–0.2)

## 2022-08-29 LAB — LACTATE DEHYDROGENASE: LDH: 111 U/L (ref 98–192)

## 2022-08-29 LAB — CMP (CANCER CENTER ONLY)
ALT: 10 U/L (ref 0–44)
AST: 18 U/L (ref 15–41)
Albumin: 4.1 g/dL (ref 3.5–5.0)
Alkaline Phosphatase: 68 U/L (ref 38–126)
Anion gap: 11 (ref 5–15)
BUN: 8 mg/dL (ref 8–23)
CO2: 23 mmol/L (ref 22–32)
Calcium: 9.3 mg/dL (ref 8.9–10.3)
Chloride: 106 mmol/L (ref 98–111)
Creatinine: 0.67 mg/dL (ref 0.61–1.24)
GFR, Estimated: >60 mL/min (ref >60)
Glucose, Bld: 96 mg/dL (ref 70–99)
Potassium: 3.3 mmol/L — ABNORMAL LOW (ref 3.5–5.1)
Sodium: 140 mmol/L (ref 135–145)
Total Bilirubin: 0.3 mg/dL (ref 0.3–1.2)
Total Protein: 7.7 g/dL (ref 6.5–8.1)

## 2022-08-29 LAB — PSA: Prostatic Specific Antigen: 0.22 ng/mL (ref 0.00–4.00)

## 2022-08-29 MED ORDER — ENZALUTAMIDE 40 MG PO TABS
160.0000 mg | ORAL_TABLET | Freq: Every day | ORAL | 0 refills | Status: DC
Start: 2022-08-29 — End: 2022-10-21
  Filled 2022-08-29 – 2022-09-26 (×2): qty 120, 30d supply, fill #0

## 2022-08-29 NOTE — Progress Notes (Signed)
No concerns today 

## 2022-08-29 NOTE — Progress Notes (Signed)
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   06/24/2021, PSA 1.80. 11/08/2021, PSA 2.9 12/17/2021, PSA 3.27. 05/06/2021, PSA increased to 5.75.  Indicating castration resistant disease.  PMSA showed no distant metastatic disease, except intesne acitivity in prostate. No RT per Dr. Lahoma Crocker 160mg  in end of March 2024. 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 Stable tumor markers.  No recurrence.  Will repeat tumor markers next visit.  Androgen deprivation therapy Eligard 45mg  Q6 months.  Next due arpund 01/30/23   Metastatic cancer to bone [facility]) PMSA showed no bone metastatic disease. Continue monitor.   IDA (iron deficiency anemia) Hemoglobin has significantly decreased, iron panel indicates iron deficiency.  Continue  Ferrous sulfate 325mg  once daily  Orders Placed This Encounter  Procedures   CBC with Differential (Cancer Center Only)    Standing Status:   Future    Standing Expiration Date:   08/29/2023   CMP (Cancer Center only)    Standing Status:   Future    Standing Expiration Date:   08/29/2023   PSA    Standing Status:   Future    Standing Expiration Date:   08/29/2023   Iron and TIBC    Standing Status:   Future    Standing Expiration Date:   08/29/2023   Ferritin    Standing Status:   Future    Standing Expiration Date:   08/29/2023   AFP tumor  marker    Standing Status:   Future    Standing Expiration Date:   08/29/2023   Follow up in 2 months.  All questions were answered. The patient knows to call the clinic with any problems, questions or concerns.  Rickard Patience, MD, PhD [facility] Hematology Oncology 08/29/2022   HISTORY OF PRESENTING ILLNESS:  Oncology History  Prostate cancer [facility])  03/10/2018 Initial Diagnosis   Prostate cancer [facility])  Patient was diagnosed with presumed prostate cancer on 10/2018 with a PSA of 509, incidental findings of multiple sclerotic lesions on CT-chest abdomen pelvis during work-up for a CVA.  His previous urology care was with Dr. Ronne Binning. 11/05/2018 bone scan showed solitary punctate focus of activity in the left first sacral segment corresponding to a 9 mm mixed lytic and sclerotic metastatic's on recent CT.  The remaining numerous sclerotic osseous metastasis identified on CT do not demonstrate abnormal activity and are therefore healed.  At that time No tissue diagnosis/biopsy was obtained. Patient was started with Lupron and Casodex Lupron was eventually to Eligard Patient [facility] on [facility], PSA was 13.13, testosterone level less than 10. Casodex was discontinued in June 2021 10/26/2019 patient switched to [facility] urology and was seen by Dr. Lonna Cobb Per urology note, PSA trend: 11/03/2018: 502 11/22/2018: 377 02/21/2019: 387 04/25/2019: 123 09/05/2019: 13.3 Patient was continued on Eligard 45mg  Q6 months.    10/06/2019 Imaging   bone scan showed resolution of punctated activity over  the left sacrum. The punctate area of increased activity over the right lower lumbar spine again noted.    10/31/2019 Imaging   CT angio chest aorta showed no evidence of aortic aneurysm or dissection.  Atherosclerotic calcification spleen seen in the upper abdominal aorta.  Chronic artery disease.  No acute cardiopulmonary disease. Patient has a chronic right neck mass.  He has known  history of large right parotid and parotid region mass which has been stable since 2016 on CT neck that was done in August 2020.    11/18/2019 Imaging   11/18/2019 CT abdomen pelvis with contrast showed retrocaval lymphadenopathy in the right common and external iliac chains.  Stable to minimally progressed in the interval since last CT scan 11/03/2018 Numerous sclerotic bone lesions.  Stable 3 mm subpleural right middle lobe lung nodule stable.  Aortic atherosclerosis   11/28/2019 Procedure   Paracaval lymph node biopsy showed metastatic neoplasm,consistent with metastatic germ cell tumor, morphologically compatible with seminoma, and his case was reviewed on tumor board.  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 Appearance was consistent with primary testicular neoplasm. Normal size and appearance of the left testicle    01/17/2020 Surgery   S/p right orchiectomy  residual germ cell tumor and germ cell neoplasia in situ are not identified.  Case was discussed on tumor board.  Consensus reached a point although residual germ cell tumor/infection was not identified, the presence of scar with extensive tubular atrophy is compatible with a completely regressed germ cell tumor.   He went to [facility] for second opinion as I recommended. Due to long waiting time, he left without being seen. Dr.Rush from [facility] called and agrees with patient current treatment.    03/14/2020 Imaging   PET  . - small hypermetabolic lymph nodes along the RIGHT iliac vessels is most consistent with nodal metastasis.    04/12/2020 - 05/08/2020 Radiation  Therapy   radiation to periaortic lymph nodes as well as right hemipelvis node.    12/19/2020 Imaging   CT chest abdomen pelvis showed interval resolution of retrocaval and right iliac lymphadenotomy. No new or progressive disease. Stable sclerotic lesions. Non obstructing bilateral nephrolithiasis.    06/24/2021 Tumor Marker   PSA 1.80    11/08/2021 Tumor Marker   PSA 2.9    11/14/2021 Imaging   CT chest abdomen pelvis w contrast  No new or progressive findings on today's examination.   Unchanged multiple small sclerotic lesions in the thoracic and lumbar spine as well as the pelvis. Stable 4 mm right middle lobe nodule. Recommend continued attention on follow-up. Aortic Atherosclerosis   12/17/2021 Tumor Marker   PSA 3.27   05/06/2022 Tumor Marker   PSA 5.75   05/29/2022 Imaging   PMSA PET showed 1. Intense radiotracer activity within the prostate gland consistent with primary prostate adenocarcinoma. 2. Resolution of RIGHT iliac adenopathy. 3. No evidence of metastatic adenopathy in the pelvis or periaortic retroperitoneum. 4. No evidence of visceral metastasis or skeletal metastasis. 5. Bilobed hypodense mass in the RIGHT neck is stable over multiple comparison exams including FDG PET scan and CT neck 11/03/2018  05/2022 -  Chemotherapy   Started on Xtandi 160mg  daily.    07/14/2022 Tumor Marker   PSA is pending      Patient has extensive cardiology issues.  He follows up with Dr. Herbie Baltimore. Patient has history of STEMI-PCI to LAD with resultant ischemia cardiomyopathy, EF 35 to 40%, left bundle branch block with acute CVA-10/2018.  CT coronary showed nonobstructing plaquing of coronary distributions, intimal irregularity with filling defect in the ascending aorta but no LV thrombus.  And the patient is on warfarin and Plavix.  Patient was seen by thoracic surgeon Dr. Cornelius Moras will recommend patient to be on Coumadin for minimum of 3 months.  With presentation with stroke, Dr. Herbie Baltimore  recommend at least 6 to 12 months of Coumadin. -Coumadin was discontinued by Dr. Herbie Baltimore in September 2021 and switched to.  Plavix Patient lives with his sister.  He has 3 adult children.  His activity is quite limited due to shortness of breath with exertion due to cardiology problems.  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  Patient has an ENT evaluation.  Status post biopsy- WARTHIN TUMOR.  Negative for atypia and malignancy. # He has had CRT-D implant.  # 12/19/2020 CT chest abdomen pelvis showed interval resolution of retrocaval and right iliac lymphadenotomy. No new or progressive disease. Stable sclerotic lesions. Non obstructing bilateral nephrolithiasis.  #06/14/2021, CT neck soft tissue with contrast showed multiple bilateral parotid masses, largest lesion on the right has increased the size, now 5.1 x 2.9 x 4.9 cm.  Previously 4.1 cm in 2016.  INTERVAL HISTORY Daniel Weiss is a 62 y.o. male who has above history reviewed by me today presents for follow up visit for management of metastatic prostate cancer and germ cell tumor.  He has no new complaints today.  He started on Xtandi at the end of March and supposed to follow up a few weeks for tolerata check. He rescheduled appt due to family reason.  He reports doing well,. Tolerating treatment well. No new complaints.   Review of Systems  Constitutional:  Positive for fatigue. Negative for appetite change, chills, fever and unexpected weight change.  HENT:   Negative for hearing loss and voice change.        Chronic neck mass  Eyes:  Negative for eye problems and icterus.  Respiratory:  Negative for chest tightness, cough and shortness of breath.   Cardiovascular:  Negative for chest pain and leg swelling.  Gastrointestinal:  Negative for abdominal distention and abdominal pain.  Endocrine: Positive for hot flashes.  Genitourinary:  Negative for difficulty urinating, dysuria and frequency.    Musculoskeletal:  Negative for arthralgias.  Skin:  Negative for itching and rash.  Neurological:  Negative for dizziness, light-headedness and numbness.  Hematological:  Negative for adenopathy. Does not bruise/bleed easily.  Psychiatric/Behavioral:  Negative for confusion.     MEDICAL HISTORY:  Past Medical History:  Diagnosis Date   Abnormal chest CT 02/2018   a. Ectatic 3 cm Ao arch - rec f/u outpt imaging w/ CTA or MRA. Ectatic atheromatous abd Ao @ risk for aneurysm - rec f/u u/s in 5 yrs. Marked prostatic enlargement w/ scattered small sclerotic foci in the thoracic lower lumbar spine, sacrum, left 12th rib, pelvis, and right femur.  Recommend elective outpatient whole-body bone scintigraphy and PSA.   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 02/2018 ACS/PCI: LM nl, LAD 85p (3.5x15 Sierra DES), D1 45ost, RI 55ost, RCA nl, EF 25-35%.   Cardiac murmur    Chest pain on exertion 11/28/2019   Chronic combined systolic and diastolic CHF, NYHA class 2 (HCC) 04/17/2020   Complete left bundle branch block (LBBB) 2016   Coronary artery disease involving native coronary artery of native heart with angina pectoris (HCC) 03/10/2018   Dyspnea    Encephalopathy, hypertensive    Essential hypertension 07/07/2014   Germ cell tumor (HCC) 12/22/2019   Goals of care, counseling/discussion 12/22/2019   Hematemesis without nausea    HFrEF (heart failure with reduced ejection fraction) (HCC)    History of CVA (cerebrovascular accident) 11/23/2018   History of non-ST elevation myocardial infarction (NSTEMI) 02/21/2019   Hypertension    Hypokalemia    Hyponatremia 06/29/2015   Ischemic cardiomyopathy 02/2018   a. 02/2018 Echo: EF 35-40% (LV gram on cath  was 25-30%), mod, diff HK. mid-apicalanteroseptal, ant, and apical sev HK. RVH.-->  No notable change on echo from June 2020 and August 2020.   Localized swelling, mass or lump of neck    Long term (current) use of anticoagulants 11/16/2018   Malnutrition of moderate degree 11/11/2018   Mass of right side of neck 07/07/2014   Myocardial infarction [facility]    Neck mass    Right Neck mass - US neck showed 6.3 cm complex, partially cystic, partially solid mass.  CT neck showed 2 cm mass from parotid gland on right, with 3x4x5cm  Possible metastatic lymphadenopathy.  we recommended following up with Dr. Jenne Pane ENT for biopsy of this.      Non-ST elevation (NSTEMI) myocardial infarction (HCC) 03/06/2018   NSTEMI (non-ST elevated myocardial infarction) (HCC) 02/2018   85% pLAD - DES PCI    Paresthesia 07/07/2014   Prostate cancer (HCC) 03/10/2018   Prostate cancer metastatic to multiple sites [facility]) 11/2018   Right sided weakness 07/07/2014   Stroke [facility])    TIA (transient ischemic attack) 2016   Warthin's tumor     SURGICAL HISTORY: Past Surgical History:  Procedure Laterality Date   BIV ICD INSERTION CRT-D N/A 06/06/2020   Procedure: BIV ICD INSERTION CRT-D;  Surgeon: Regan Lemming, MD;  Location: [facility] INVASIVE CV LAB;  Service: Cardiovascular;  Laterality: N/A;   COLONOSCOPY WITH PROPOFOL N/A 02/27/2020   Procedure: COLONOSCOPY WITH PROPOFOL;  Surgeon: Benancio Deeds, MD;  Location: [facility] ENDOSCOPY;  Service: Gastroenterology;  Laterality: N/A;   CORONARY STENT INTERVENTION N/A 03/06/2018   Procedure: CORONARY STENT INTERVENTION;  Surgeon: Marykay Lex, MD;  Location: [facility] INVASIVE CV LAB;  Service: Cardiovascular: Proximal LAD 85% stenosis (and D1): DES PCI with Xience Sierra DES 3.5 mm x 15 mm - 3.9 mm   ESOPHAGOGASTRODUODENOSCOPY (EGD) WITH PROPOFOL N/A 02/27/2020   Procedure: ESOPHAGOGASTRODUODENOSCOPY (EGD) WITH PROPOFOL;  Surgeon: Benancio Deeds, MD;  Location: [facility] ENDOSCOPY;   Service: Gastroenterology;  Laterality: N/A;   HEMOSTASIS CLIP PLACEMENT  02/27/2020   Procedure: HEMOSTASIS CLIP PLACEMENT;  Surgeon: Benancio Deeds, MD;  Location: [facility] ENDOSCOPY;  Service: Gastroenterology;;   HOT HEMOSTASIS N/A 02/27/2020   Procedure: HOT HEMOSTASIS (ARGON PLASMA COAGULATION/BICAP);  Surgeon: Benancio Deeds, MD;  Location: [facility] ENDOSCOPY;  Service: Gastroenterology;  Laterality: N/A;   INTRAOPERATIVE TRANSTHORACIC ECHOCARDIOGRAM  03/06/2018   (Peri-MI)  mild reduced EF 35-40%.  Diffuse hypokinesis (severe HK of the mid-apical anteroseptal, anterior and apical myocardium consistent with LAD infarct).  Unable to assess diastolic function.  Paradoxical septal motion likely related to his LBBB.  RV hypertrophy noted.  Trivial pericardial effusion noted.    LEFT HEART CATH AND CORONARY ANGIOGRAPHY N/A 03/06/2018   Procedure: LEFT HEART CATH AND CORONARY ANGIOGRAPHY;  Surgeon: Marykay Lex, MD;  Location: [facility] INVASIVE CV LAB;  Service: Cardiovascular: Proximal LAD 85% involving D1 45%.  Ostial RI 55%.  Severe LV dysfunction EF 25 to 30%.  1+ MR.  Only minimally elevated LVEDP.   ORCHIECTOMY Right 01/17/2020   Procedure: ORCHIECTOMY;  Surgeon: Riki Altes, MD;  Location: [facility] ORS;  Service: Urology;  Laterality: Right;   POLYPECTOMY  02/27/2020   Procedure: POLYPECTOMY;  Surgeon: Benancio Deeds, MD;  Location: [facility] ENDOSCOPY;  Service: Gastroenterology;;   PROSTATE BIOPSY N/A 01/17/2020   Procedure: BIOPSY TRANSRECTAL ULTRASONIC PROSTATE (TUBP);  Surgeon: Riki Altes, MD;  Location: [facility] ORS;  Service: Urology;  Laterality: N/A;   TRANSTHORACIC ECHOCARDIOGRAM  10/2018   (Most recent echo, to evaluate for TIA/CVA) : Moderate reduced EF 35 to 40%.  Moderate concentric LVH.  Mild dyskinesis of the apex and severe hypokinesis of mid apical anteroseptal and anterior wall.  Unable to assess RV pressures.  Aortic sclerosis with no stenosis.  No significant change seen     SOCIAL HISTORY: Social History   Socioeconomic History   Marital status: Legally Separated    Spouse name: Dorothenia   Number of children: Not on file   Years of education: Not on file   Highest education level: Not on file  Occupational History   Occupation: cook  Tobacco Use   Smoking status: Some Days    Packs/day: 0.00    Years: 0.00    Additional pack years: 0.00    Total pack years: 0.00    Types: Cigarettes   Smokeless tobacco: Never   Tobacco comments:    quit three days ago  Vaping Use   Vaping Use: Never used  Substance and Sexual Activity   Alcohol use: Yes    Alcohol/week: 2.0 standard drinks of alcohol    Types: 2 Cans of beer per week    Comment: 2 Beers daily.   Drug use: Yes    Frequency: 3.0 times per week    Types: Marijuana   Sexual activity: Not Currently  Other Topics Concern   Not on file  Social History Narrative   Currently not working.  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  Now has cancer.   Social Determinants of Health   Financial Resource Strain: Not on file  Food Insecurity: Not on file  Transportation Needs: No Transportation Needs (11/22/2018)   PRAPARE - Administrator, Civil Service (Medical): No    Lack of Transportation (Non-Medical): No  Physical Activity: Not on file  Stress: Not on file  Social Connections: Not on file  Intimate Partner Violence: Not on file    FAMILY HISTORY: Family History  Problem Relation Age of Onset   Stroke Mother    Hypertension Mother    Diabetes type II Mother    Leukemia Other    Breast cancer Other    Stroke Brother    CAD Neg Hx     ALLERGIES:  has No Known Allergies.  MEDICATIONS:  Current Outpatient Medications  Medication  Sig Dispense Refill   clopidogrel (PLAVIX) 75 MG tablet Take 1 tablet by mouth once daily 90 tablet 3   docusate sodium (COLACE) 100 MG capsule Take 1 capsule (100 mg total) by mouth daily. 30 capsule 3   ezetimibe  (ZETIA) 10 MG tablet Take 1 tablet by mouth once daily 90 tablet 0   ferrous sulfate 325 (65 FE) MG EC tablet Take 1 tablet (325 mg total) by mouth 2 (two) times daily with a meal. 60 tablet 3   furosemide (LASIX) 20 MG tablet May take 20 mg tablet as needed for increase shortness of breath or swelling 20 tablet 6   metoprolol succinate (TOPROL-XL) 50 MG 24 hr tablet Take 1 tablet (50 mg total) by mouth daily. Take with or immediately following a meal. 90 tablet 3   nitroGLYCERIN (NITROSTAT) 0.4 MG SL tablet Place 1 tablet (0.4 mg total) under the tongue every 5 (five) minutes as needed for chest pain. 25 tablet 3   rosuvastatin (CRESTOR) 40 MG tablet Take 1 tablet (40 mg total) by mouth daily. 90 tablet 3   sacubitril-valsartan (ENTRESTO) 97-103 MG Take 1 tablet by mouth 2 (two) times daily. 180 tablet 2   spironolactone (ALDACTONE) 25 MG tablet Take 1 tablet (25 mg total) by mouth daily. 90 tablet 3   tamsulosin (FLOMAX) 0.4 MG CAPS capsule Take 1 capsule (0.4 mg total) by mouth daily after supper. 90 capsule 3   traZODone (DESYREL) 50 MG tablet Take 0.5-1 tablets (25-50 mg total) by mouth at bedtime as needed for sleep. 30 tablet 6   venlafaxine XR (EFFEXOR XR) 37.5 MG 24 hr capsule Take 1 capsule (37.5 mg total) by mouth daily with breakfast. 30 capsule 1   VENTOLIN HFA 108 (90 Base) MCG/ACT inhaler INHALE 2 PUFFS BY MOUTH EVERY 6 HOURS AS NEEDED FOR WHEEZING OR SHORTNESS OF BREATH 18 g 0   enzalutamide (XTANDI) 40 MG tablet Take 4 tablets (160 mg total) by mouth daily. 120 tablet 0   No current facility-administered medications for this visit.   Facility-Administered Medications Ordered in Other Visits  Medication Dose Route Frequency Provider Last Rate Last Admin   leuprolide (6 Month) (ELIGARD) injection 45 mg  45 mg Subcutaneous Once Rickard Patience, MD         PHYSICAL EXAMINATION: ECOG PERFORMANCE STATUS: 1 - Symptomatic but completely ambulatory Vitals:   08/29/22 1047 08/29/22 1053   BP: (!) 206/93 (!) 197/93  Pulse: 85   Temp: (!) 96.5 F (35.8 C)   SpO2: 99%    Filed Weights   08/29/22 1047  Weight: 171 lb 8 oz (77.8 kg)    Physical Exam Constitutional:      General: He is not in acute distress.    Comments: Thin built.   HENT:     Head: Normocephalic and atraumatic.  Eyes:     General: No scleral icterus. Neck:     Comments: Palpable right cervical mass Cardiovascular:     Rate and Rhythm: Normal rate.  Pulmonary:     Effort: Pulmonary effort is normal. No respiratory distress.  Abdominal:     General: Bowel sounds are normal. There is no distension.     Palpations: Abdomen is soft.  Musculoskeletal:        General: No deformity. Normal range of motion.     Cervical back: Normal range of motion and neck supple.  Skin:    General: Skin is warm and dry.     Findings: No erythema  or rash.  Neurological:     Mental Status: He is alert and oriented to person, place, and time. Mental status is at baseline.     Cranial Nerves: No cranial nerve deficit.  Psychiatric:        Mood and Affect: Mood normal.     LABORATORY DATA:  I have reviewed the data as listed    Latest Ref Rng & Units 08/29/2022    9:47 AM 07/14/2022    8:22 AM 05/06/2022   11:00 AM  CBC  WBC 4.0 - 10.5 K/uL 4.6  4.3  4.2   Hemoglobin 13.0 - 17.0 g/dL 29.5  9.9  18.8   Hematocrit 39.0 - 52.0 % 33.9  31.0  37.8   Platelets 150 - 400 K/uL 325  317  405       Latest Ref Rng & Units 08/29/2022    9:47 AM 07/14/2022    8:22 AM 05/06/2022   11:00 AM  CMP  Glucose 70 - 99 mg/dL 96  416  606   BUN 8 - 23 mg/dL 8  13  13    Creatinine 0.61 - 1.24 mg/dL 3.01  6.01  0.93   Sodium 135 - 145 mmol/L 140  142  138   Potassium 3.5 - 5.1 mmol/L 3.3  3.9  3.5   Chloride 98 - 111 mmol/L 106  110  105   CO2 22 - 32 mmol/L 23  24  25    Calcium 8.9 - 10.3 mg/dL 9.3  9.3  9.8   Total Protein 6.5 - 8.1 g/dL 7.7  7.0  8.5   Total Bilirubin 0.3 - 1.2 mg/dL 0.3  0.3  0.6   Alkaline Phos 38 - 126  U/L 68  53  68   AST 15 - 41 U/L 18  16  22    ALT 0 - 44 U/L 10  10  12       RADIOGRAPHIC STUDIES: I have personally reviewed the radiological images as listed and agreed with the findings in the report. No results found.

## 2022-08-29 NOTE — Assessment & Plan Note (Addendum)
#  Germ cell tumor, stage II, status post radiation.  tumor markers are pending No recurrence.  Will repeat tumor markers next visit.

## 2022-08-29 NOTE — Assessment & Plan Note (Signed)
PMSA showed no bone metastatic disease. Continue monitor.  

## 2022-08-29 NOTE — Assessment & Plan Note (Signed)
Hemoglobin has significantly decreased, iron panel indicates iron deficiency.  Continue  Ferrous sulfate 325mg  once daily

## 2022-08-29 NOTE — Assessment & Plan Note (Addendum)
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   06/24/2021, PSA 1.80. 11/08/2021, PSA 2.9 12/17/2021, PSA 3.27. 05/06/2021, PSA increased to 5.75.  Indicating castration resistant disease.  PMSA showed no distant metastatic disease, except intesne acitivity in prostate. No RT per Dr. Lahoma Crocker 160mg  in end of March 2024. -PSA has improved to 0.4 He tolerates well and I recommend him to continue current regimen

## 2022-08-29 NOTE — Assessment & Plan Note (Addendum)
Eligard 45mg  Q6 months.  Next due arpund 01/30/23

## 2022-08-31 LAB — BETA HCG QUANT (REF LAB): hCG Quant: <1 m[IU]/mL (ref 0–3)

## 2022-08-31 LAB — AFP TUMOR MARKER: AFP, Serum, Tumor Marker: 3.8 ng/mL (ref 0.0–8.4)

## 2022-09-04 ENCOUNTER — Other Ambulatory Visit (HOSPITAL_COMMUNITY): Payer: Self-pay

## 2022-09-04 ENCOUNTER — Ambulatory Visit: Payer: Medicaid Other

## 2022-09-04 DIAGNOSIS — I255 Ischemic cardiomyopathy: Secondary | ICD-10-CM | POA: Diagnosis not present

## 2022-09-04 LAB — CUP PACEART REMOTE DEVICE CHECK
Battery Remaining Longevity: 52 mo
Battery Remaining Percentage: 65 %
Battery Voltage: 2.96 V
Brady Statistic AP VP Percent: 8 %
Brady Statistic AP VS Percent: 1 %
Brady Statistic AS VP Percent: 87 %
Brady Statistic AS VS Percent: 4.2 %
Brady Statistic RA Percent Paced: 8.1 %
Date Time Interrogation Session: 20240627020013
HighPow Impedance: 80 Ohm
Implantable Lead Connection Status: 753985
Implantable Lead Connection Status: 753985
Implantable Lead Connection Status: 753985
Implantable Lead Implant Date: 20220330
Implantable Lead Implant Date: 20220330
Implantable Lead Implant Date: 20220330
Implantable Lead Location: 753858
Implantable Lead Location: 753859
Implantable Lead Location: 753860
Implantable Pulse Generator Implant Date: 20220330
Lead Channel Impedance Value: 350 Ohm
Lead Channel Impedance Value: 430 Ohm
Lead Channel Impedance Value: 800 Ohm
Lead Channel Pacing Threshold Amplitude: 0.75 V
Lead Channel Pacing Threshold Amplitude: 0.75 V
Lead Channel Pacing Threshold Amplitude: 1.75 V
Lead Channel Pacing Threshold Pulse Width: 0.5 ms
Lead Channel Pacing Threshold Pulse Width: 0.5 ms
Lead Channel Pacing Threshold Pulse Width: 0.5 ms
Lead Channel Sensing Intrinsic Amplitude: 1 mV
Lead Channel Sensing Intrinsic Amplitude: 12 mV
Lead Channel Setting Pacing Amplitude: 2 V
Lead Channel Setting Pacing Amplitude: 2.5 V
Lead Channel Setting Pacing Amplitude: 3 V
Lead Channel Setting Pacing Pulse Width: 0.5 ms
Lead Channel Setting Pacing Pulse Width: 0.5 ms
Lead Channel Setting Sensing Sensitivity: 0.5 mV
Pulse Gen Serial Number: 111039291
Zone Setting Status: 755011

## 2022-09-08 ENCOUNTER — Other Ambulatory Visit (HOSPITAL_COMMUNITY): Payer: Self-pay

## 2022-09-10 ENCOUNTER — Telehealth: Payer: Self-pay | Admitting: *Deleted

## 2022-09-10 NOTE — Telephone Encounter (Signed)
-----   Message from Will Jorja Loa, MD sent at 09/08/2022  1:07 PM EDT ----- Abnormal device interrogation reviewed.  Lead parameters and battery status stable.  Multiple SVT episodes.  Increase Toprol-XL to 100 mg.

## 2022-09-10 NOTE — Telephone Encounter (Signed)
Left message to call back  

## 2022-09-23 ENCOUNTER — Other Ambulatory Visit (HOSPITAL_COMMUNITY): Payer: Self-pay

## 2022-09-23 NOTE — Progress Notes (Signed)
 Remote ICD transmission.   

## 2022-09-26 ENCOUNTER — Other Ambulatory Visit: Payer: Self-pay

## 2022-09-26 ENCOUNTER — Other Ambulatory Visit (HOSPITAL_COMMUNITY): Payer: Self-pay

## 2022-10-16 NOTE — Telephone Encounter (Signed)
Left message to call back  

## 2022-10-21 ENCOUNTER — Other Ambulatory Visit (HOSPITAL_COMMUNITY): Payer: Self-pay

## 2022-10-21 ENCOUNTER — Other Ambulatory Visit: Payer: Self-pay | Admitting: Oncology

## 2022-10-21 DIAGNOSIS — C61 Malignant neoplasm of prostate: Secondary | ICD-10-CM

## 2022-10-21 MED ORDER — ENZALUTAMIDE 40 MG PO TABS
160.0000 mg | ORAL_TABLET | Freq: Every day | ORAL | 0 refills | Status: DC
Start: 2022-10-21 — End: 2022-11-17
  Filled 2022-10-21: qty 120, 30d supply, fill #0

## 2022-10-21 NOTE — Telephone Encounter (Signed)
CBC with Differential ([facility]cer Center Only) Order: [id number] Status: Final result     Visible to patient: No (inaccessible in MyChart)     Next appt: 10/31/2022 at 10:30 AM in Oncology ([facility])     Dx: Prostate cancer (HCC)   0 Result Notes          Component Ref Range & Units 1 mo ago (08/29/22) 3 mo ago (07/14/22) 5 mo ago (05/06/22) 10 mo ago (12/17/21) 11 mo ago (11/20/21) 11 mo ago (11/04/21) 1 yr ago (06/24/21)  WBC Count 4.0 - 10.5 K/uL 4.6 4.3 4.2 4.3 5.7 6.4 5.6  RBC 4.22 - 5.81 MIL/uL 4.38 3.93 Low  4.68 5.11 5.44 4.53 5.18  Hemoglobin 13.0 - 17.0 g/dL 40.9 Low  9.9 Low  CM 81.1 Low  12.8 Low  13.4 11.0 Low  12.4 Low   HCT 39.0 - 52.0 % 33.9 Low  31.0 Low  37.8 Low  38.5 Low  41.4 34.6 Low  38.3 Low   MCV 80.0 - 100.0 fL 77.4 Low  78.9 Low  80.8 75.3 Low  76.1 Low  76.4 Low  73.9 Low   MCH 26.0 - 34.0 pg 25.1 Low  25.2 Low  25.4 Low  25.0 Low  24.6 Low  24.3 Low  23.9 Low   MCHC 30.0 - 36.0 g/dL 91.4 78.2 95.6 21.3 08.6 31.8 32.4  RDW 11.5 - 15.5 % 19.6 High  18.6 High  19.6 High  18.8 High  19.9 High  19.5 High  21.2 High   Platelet Count 150 - 400 K/uL 325 317 405 High  403 High  376 342 431 High   nRBC 0.0 - 0.2 % 0.0 0.0 0.0 0.0 0.0 0.0 0.0  Neutrophils Relative % % 56 46 50 47 45 62 60  Neutro Abs 1.7 - 7.7 K/uL 2.6 2.0 2.1 2.0 2.6 4.0 3.4  Lymphocytes Relative % 32 35 36 39 42 24 27  Lymphs Abs 0.7 - 4.0 K/uL 1.4 1.5 1.5 1.7 2.4 1.5 1.5  Monocytes Relative % 11 16 12 12 12 12 12   Monocytes Absolute 0.1 - 1.0 K/uL 0.5 0.7 0.5 0.5 0.7 0.8 0.7  Eosinophils Relative % 0 2 1 1  0 1 0  Eosinophils Absolute 0.0 - 0.5 K/uL 0.0 0.1 0.0 0.0 0.0 0.0 0.0  Basophils Relative % 1 1 1 1 1 1 1   Basophils Absolute 0.0 - 0.1 K/uL 0.0 0.0 0.0 0.0 0.0 0.0 0.0  Immature Granulocytes % 0 0 0 0 0 0 0  Abs Immature Granulocytes 0.00 - 0.07 K/uL 0.01 0.01 CM 0.01 CM 0.01 CM 0.01 CM 0.02 CM 0.02 CM  Comment: Performed at [facility], [address]  [place], [place] [address]  Resulting Agency [facility]         Specimen Collected: 08/29/22 09:47 Last Resulted: 08/29/22 09:59      Lab Flowsheet      Order Details      View Encounter      Lab and Collection Details      Routing      Result History    View All Conversations on this Encounter      CM=Additional comments      Result Care Coordination   Patient Communication   Add Comments   Not seen Back to Top    Other Results from 08/29/2022  Lactate  dehydrogenase Order: [id number] Status: Final result      Visible to patient: No (inaccessible in MyChart)      Next appt: 10/31/2022 at 10:30 AM in Oncology ([facility])      Dx: Prostate cancer (HCC); Metastatic can...    0 Result Notes            Component Ref Range & Units 1 mo ago (08/29/22) 11 mo ago (11/04/21) 1 yr ago (03/25/21) 1 yr ago (12/19/20) 2 yr ago (09/17/20) 2 yr ago (02/10/20) 2 yr ago (12/15/19)  LDH 98 - 192 U/L 111 125 CM 121 CM 156 CM 128 CM 132 CM 153 CM  Comment: Performed at [facility], [address] [place], [place] [address]  Resulting Agency [facility]         Specimen Collected: 08/29/22 09:47 Last Resulted: 08/29/22 10:14      Lab Flowsheet       Order Details       View Encounter       Lab and Collection Details       Routing       Result History     View All Conversations on this Encounter      CM=Additional comments      Result Care Coordination   Patient Communication   Add Comments   Not seen Back to Top      AFP tumor marker Order: [id number] Status: Final result      Visible to patient: No (inaccessible in MyChart)      Next appt: 10/31/2022 at 10:30 AM in Oncology ([facility])      Dx: Germ cell tumor (HCC); Cancer of desc...    0 Result Notes            Component Ref Range  & Units 1 mo ago (08/29/22) 11 mo ago (11/04/21) 1 yr ago (03/25/21) 1 yr ago (12/19/20) 2 yr ago (09/17/20) 2 yr ago (02/10/20) 2 yr ago (12/15/19)  AFP, Serum, Tumor Marker 0.0 - 8.4 ng/mL 3.8 2.9 CM 2.6 CM 3.9 CM 3.7 CM 3.9 R, CM 5.4 R, CM  Comment: (NOTE) [employer] Electrochemiluminescence Immunoassay (ECLIA) Values obtained with different assay methods or kits cannot be used interchangeably.  Results cannot be interpreted as absolute evidence of the presence or absence of malignant disease. This test is not interpretable in pregnant females. Performed At: [facility] [address] [place], [place] [id number] Jolene Schimke MD VZ:[id number]  Resulting Agency [facility]         Specimen Collected: 08/29/22 09:47 Last Resulted: 08/31/22 16:36      Lab Flowsheet       Order Details       View Encounter       Lab and Collection Details       Routing       Result History     View All Conversations on this Encounter      CM=Additional comments  R=Reference range differs from displayed range      Result Care Coordination   Patient Communication   Add Comments   Not seen Back to Top      Beta HCG, Quant (tumor marker) Order: [id number] Status: Final result      Visible to patient:  No (inaccessible in MyChart)      Next appt: 10/31/2022 at 10:30 AM in Oncology ([facility])      Dx: Germ cell tumor (HCC); Cancer of desc...    0 Result Notes            Component Ref Range & Units 1 mo ago (08/29/22) 11 mo ago (11/04/21) 1 yr ago (03/25/21) 1 yr ago (12/19/20) 2 yr ago (09/17/20) 2 yr ago (02/10/20) 2 yr ago (12/15/19)  hCG Quant 0 - 3 mIU/mL <1 <1 CM <1 CM <1 CM <1 CM <1 CM <1 CM  Comment: (NOTE) Roche ECLIA methodology Performed At: [facility] [employer] [address] [place], [place] [id number] Jolene Schimke MD WU:[id number]  Resulting Agency [facility]  [facility]         Specimen Collected: 08/29/22 09:47 Last Resulted: 08/31/22 08:14      Lab Flowsheet       Order Details       View Encounter       Lab and Collection Details       Routing       Result History     View All Conversations on this Encounter      CM=Additional comments      Result Care Coordination   Patient Communication   Add Comments   Not seen Back to Top      PSA Order: [id number] Status: Final result      Visible to patient: No (inaccessible in MyChart)      Next appt: 10/31/2022 at 10:30 AM in Oncology ([facility])      Dx: Prostate cancer (HCC)    0 Result Notes            Component Ref Range & Units 1 mo ago (08/29/22) 3 mo ago (07/14/22) 5 mo ago (05/06/22) 10 mo ago (12/17/21) 11 mo ago (11/08/21) 1 yr ago (06/24/21) 1 yr ago (03/25/21)  Prostatic Specific Antigen 0.00 - 4.00 ng/mL 0.22 0.41 CM 5.75 High  CM 3.27 CM 2.90 CM 1.80 CM 0.58 CM  Comment: (NOTE) While PSA levels of <=4.00 ng/ml are reported as reference range, some men with levels below 4.00 ng/ml can have prostate cancer and many men with PSA above 4.00 ng/ml do not have prostate cancer.  Other tests such as free PSA, age specific reference ranges, PSA velocity and PSA doubling time may be helpful especially in men less than 69 years old. Performed at [facility], [address] [place], [place] [address]  Resulting Agency [facility]         Specimen Collected: 08/29/22 09:47 Last Resulted: 08/29/22 20:25

## 2022-10-22 ENCOUNTER — Other Ambulatory Visit (HOSPITAL_COMMUNITY): Payer: Self-pay

## 2022-10-31 ENCOUNTER — Inpatient Hospital Stay: Payer: Medicare Other | Admitting: Oncology

## 2022-10-31 ENCOUNTER — Inpatient Hospital Stay: Payer: Medicare Other | Attending: Oncology

## 2022-10-31 ENCOUNTER — Encounter: Payer: Self-pay | Admitting: Oncology

## 2022-10-31 VITALS — BP 145/89 | HR 74 | Temp 96.1°F | Resp 18 | Wt 163.5 lb

## 2022-10-31 DIAGNOSIS — D509 Iron deficiency anemia, unspecified: Secondary | ICD-10-CM | POA: Diagnosis not present

## 2022-10-31 DIAGNOSIS — I252 Old myocardial infarction: Secondary | ICD-10-CM | POA: Diagnosis not present

## 2022-10-31 DIAGNOSIS — C7951 Secondary malignant neoplasm of bone: Secondary | ICD-10-CM | POA: Insufficient documentation

## 2022-10-31 DIAGNOSIS — IMO0001 Reserved for inherently not codable concepts without codable children: Secondary | ICD-10-CM

## 2022-10-31 DIAGNOSIS — C61 Malignant neoplasm of prostate: Secondary | ICD-10-CM

## 2022-10-31 DIAGNOSIS — C801 Malignant (primary) neoplasm, unspecified: Secondary | ICD-10-CM | POA: Diagnosis not present

## 2022-10-31 DIAGNOSIS — Z79818 Long term (current) use of other agents affecting estrogen receptors and estrogen levels: Secondary | ICD-10-CM

## 2022-10-31 DIAGNOSIS — R978 Other abnormal tumor markers: Secondary | ICD-10-CM | POA: Diagnosis not present

## 2022-10-31 DIAGNOSIS — E291 Testicular hypofunction: Secondary | ICD-10-CM | POA: Insufficient documentation

## 2022-10-31 DIAGNOSIS — C6211 Malignant neoplasm of descended right testis: Secondary | ICD-10-CM

## 2022-10-31 DIAGNOSIS — D508 Other iron deficiency anemias: Secondary | ICD-10-CM

## 2022-10-31 LAB — CMP (CANCER CENTER ONLY)
ALT: 12 U/L (ref 0–44)
AST: 25 U/L (ref 15–41)
Albumin: 4.3 g/dL (ref 3.5–5.0)
Alkaline Phosphatase: 65 U/L (ref 38–126)
Anion gap: 9 (ref 5–15)
BUN: 11 mg/dL (ref 8–23)
CO2: 20 mmol/L — ABNORMAL LOW (ref 22–32)
Calcium: 9.5 mg/dL (ref 8.9–10.3)
Chloride: 106 mmol/L (ref 98–111)
Creatinine: 0.69 mg/dL (ref 0.61–1.24)
GFR, Estimated: >60 mL/min (ref >60)
Glucose, Bld: 162 mg/dL — ABNORMAL HIGH (ref 70–99)
Potassium: 3.3 mmol/L — ABNORMAL LOW (ref 3.5–5.1)
Sodium: 135 mmol/L (ref 135–145)
Total Bilirubin: 0.3 mg/dL (ref 0.3–1.2)
Total Protein: 8 g/dL (ref 6.5–8.1)

## 2022-10-31 LAB — CBC WITH DIFFERENTIAL (CANCER CENTER ONLY)
Abs Immature Granulocytes: 0.01 10*3/uL (ref 0.00–0.07)
Basophils Absolute: 0 10*3/uL (ref 0.0–0.1)
Basophils Relative: 1 %
Eosinophils Absolute: 0 10*3/uL (ref 0.0–0.5)
Eosinophils Relative: 0 %
HCT: 35.4 % — ABNORMAL LOW (ref 39.0–52.0)
Hemoglobin: 11.1 g/dL — ABNORMAL LOW (ref 13.0–17.0)
Immature Granulocytes: 0 %
Lymphocytes Relative: 41 %
Lymphs Abs: 1.8 10*3/uL (ref 0.7–4.0)
MCH: 23.7 pg — ABNORMAL LOW (ref 26.0–34.0)
MCHC: 31.4 g/dL (ref 30.0–36.0)
MCV: 75.6 fL — ABNORMAL LOW (ref 80.0–100.0)
Monocytes Absolute: 0.5 10*3/uL (ref 0.1–1.0)
Monocytes Relative: 12 %
Neutro Abs: 2 10*3/uL (ref 1.7–7.7)
Neutrophils Relative %: 46 %
Platelet Count: 280 10*3/uL (ref 150–400)
RBC: 4.68 MIL/uL (ref 4.22–5.81)
RDW: 19.8 % — ABNORMAL HIGH (ref 11.5–15.5)
WBC Count: 4.3 10*3/uL (ref 4.0–10.5)
nRBC: 0 % (ref 0.0–0.2)

## 2022-10-31 LAB — IRON AND TIBC
Iron: 30 ug/dL — ABNORMAL LOW (ref 45–182)
Saturation Ratios: 5 % — ABNORMAL LOW (ref 17.9–39.5)
TIBC: 602 ug/dL — ABNORMAL HIGH (ref 250–450)
UIBC: 572 ug/dL

## 2022-10-31 LAB — PSA: Prostatic Specific Antigen: 0.14 ng/mL (ref 0.00–4.00)

## 2022-10-31 LAB — FERRITIN: Ferritin: 4 ng/mL — ABNORMAL LOW (ref 24–336)

## 2022-10-31 NOTE — Assessment & Plan Note (Addendum)
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 No RT per Dr. Rushie Chestnut On Xtandi 160mg  in end of March 2024. -PSA has decreased to 0.14. Continue Xtandi.

## 2022-10-31 NOTE — Assessment & Plan Note (Signed)
Eligard 45mg  Q6 months.  Next due arpund 01/30/23

## 2022-10-31 NOTE — Progress Notes (Signed)
Hematology/Oncology Progress note [phone] [phone]   CHIEF COMPLAINTS/REASON FOR VISIT:  Follow up for prostate cancer and germ cell tumor, IDA  ASSESSMENT & PLAN:   Cancer Staging  Prostate cancer [facility]) Staging form: Prostate, AJCC 8th Edition - Clinical stage from 01/17/2020: Stage IVB (cT2c, cN0, cM1, Grade Group: 4) - Signed by Rickard Patience, MD on 03/16/2020   Prostate cancer [facility]) Metastatic castration resistant prostate cancer PMSA showed no distant metastatic disease, except intesne acitivity in prostate. No RT per Dr. Rushie Chestnut On Xtandi 160mg  in end of March 2024. -PSA has decreased to 0.14. Continue Xtandi.  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  Will repeat tumor markers today, results pending.   Androgen deprivation therapy Eligard 45mg  Q6 months.  Next due arpund 01/30/23   Metastatic cancer to bone [facility]) PMSA showed no bone metastatic disease. Continue monitor.   IDA (iron deficiency anemia) Iron panel shows persistent iron deficiency.  Hemoglobin has improved. Continue ferrous sulfate 325 mg twice daily  Orders Placed This Encounter  Procedures   CBC with Differential (Cancer Center Only)    Standing Status:   Future    Standing Expiration Date:   10/31/2023   CMP (Cancer Center only)    Standing Status:   Future    Standing Expiration Date:   10/31/2023   AFP tumor marker    Standing Status:   Future    Standing Expiration Date:   10/31/2023   Beta HCG, Quant (tumor marker)    Standing Status:   Future    Standing Expiration Date:   10/31/2023   Lactate dehydrogenase    Standing Status:   Future    Standing Expiration Date:   10/31/2023   Follow up in 2 months.  All questions were answered. The patient knows to call the clinic with any problems, questions or concerns.  Rickard Patience, MD, PhD [facility] Hematology Oncology 10/31/2022   HISTORY OF PRESENTING ILLNESS:  Oncology History  Prostate  cancer [facility])  03/10/2018 Initial Diagnosis   Prostate cancer [facility])  Patient was diagnosed with presumed prostate cancer on 10/2018 with a PSA of 509, incidental findings of multiple sclerotic lesions on CT-chest abdomen pelvis during work-up for a CVA.  His previous urology care was with Dr. Ronne Binning. 11/05/2018 bone scan showed solitary punctate focus of activity in the left first sacral segment corresponding to a 9 mm mixed lytic and sclerotic metastatic's on recent CT.  The remaining numerous sclerotic osseous metastasis identified on CT do not demonstrate abnormal activity and are therefore healed.  At that time No tissue diagnosis/biopsy was obtained. Patient was started with Lupron and Casodex Lupron was eventually to Eligard Patient was [facility] on 09/05/2019, PSA was 13.13, testosterone level less than 10. Casodex was discontinued in June 2021 10/26/2019 patient switched to [facility] urology and was seen by Dr. Lonna Cobb Per urology note, PSA trend: 11/03/2018: 502 11/22/2018: 377 02/21/2019: 387 04/25/2019: 123 09/05/2019: 13.3 Patient was continued on Eligard 45mg  Q6 months.    10/06/2019 Imaging   bone scan showed resolution of punctated activity over the left sacrum. The punctate area of increased activity over the right lower lumbar spine again noted.    10/31/2019 Imaging   CT angio chest aorta showed no evidence of aortic aneurysm or dissection.  Atherosclerotic calcification spleen seen in the upper abdominal aorta.  Chronic artery disease.  No acute cardiopulmonary disease. Patient has a chronic right neck mass.  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    11/18/2019 Imaging   11/18/2019 CT abdomen pelvis with contrast showed retrocaval lymphadenopathy in the right common and external iliac chains.  Stable to minimally progressed in the interval since last CT scan  11/03/2018 Numerous sclerotic bone lesions.  Stable 3 mm subpleural right middle lobe lung nodule stable.  Aortic atherosclerosis   11/28/2019 Procedure   Paracaval lymph node biopsy showed metastatic neoplasm,consistent with metastatic germ cell tumor, morphologically compatible with seminoma, and his case was reviewed on tumor board.  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 Appearance was consistent with primary testicular neoplasm. Normal size and appearance of the left testicle    01/17/2020 Surgery   S/p right orchiectomy  residual germ cell tumor and germ cell neoplasia in situ are not identified.  Case was discussed on tumor board.  Consensus reached a point although residual germ cell tumor/infection was not identified, the presence of scar with extensive tubular atrophy is compatible with a completely regressed germ cell tumor.   He went to [facility] for second opinion as I recommended. Due to long waiting time, he left without being seen. Dr.Rush from [facility] called and agrees with patient current treatment.    03/14/2020 Imaging   PET  . - small hypermetabolic lymph nodes along the RIGHT iliac vessels is most consistent with nodal metastasis.    04/12/2020 - 05/08/2020 Radiation Therapy   radiation to periaortic lymph nodes as well as right hemipelvis node.    12/19/2020 Imaging   CT chest abdomen pelvis showed interval resolution of retrocaval and right iliac lymphadenotomy. No new or progressive disease. Stable sclerotic lesions. Non obstructing bilateral nephrolithiasis.    06/24/2021 Tumor Marker   PSA 1.80     11/08/2021 Tumor Marker   PSA 2.9    11/14/2021 Imaging   CT chest abdomen pelvis w contrast  No new or progressive findings on today's examination.   Unchanged multiple small sclerotic lesions in the thoracic and lumbar spine as well as the pelvis. Stable 4 mm right middle lobe nodule. Recommend continued attention on follow-up. Aortic Atherosclerosis   12/17/2021 Tumor Marker   PSA 3.27   05/06/2022 Tumor Marker   PSA 5.75   05/29/2022 Imaging   PMSA PET showed 1. Intense radiotracer activity within the prostate gland consistent with primary prostate adenocarcinoma. 2. Resolution of RIGHT iliac adenopathy. 3. No evidence of metastatic adenopathy in the pelvis or periaortic retroperitoneum. 4. No evidence of visceral metastasis or skeletal metastasis. 5. Bilobed hypodense mass in the RIGHT neck is stable over multiple comparison exams including FDG PET scan and CT neck 11/03/2018   05/2022 -  Chemotherapy   Started on Xtandi 160mg  daily.    07/14/2022 Tumor Marker   PSA is pending      Patient has extensive cardiology issues.  He follows up with Dr. Herbie Baltimore. Patient has history of STEMI-PCI to LAD with resultant ischemia cardiomyopathy, EF 35 to 40%, left bundle branch block with acute CVA-10/2018.  CT  coronary showed nonobstructing plaquing of coronary distributions, intimal irregularity with filling defect in the ascending aorta but no LV thrombus.  And the patient is on warfarin and Plavix.  Patient was seen by thoracic surgeon Dr. Cornelius Moras will recommend patient to be on Coumadin for minimum of 3 months.  With presentation with stroke, Dr. Herbie Baltimore recommend at least 6 to 12 months of Coumadin. -Coumadin was discontinued by Dr. Herbie Baltimore in September 2021 and switched to.  Plavix Patient lives with his sister.  He has 3 adult children.  His activity is quite limited due to shortness of breath with exertion due to cardiology problems.  # Parotid mass, radiographically stable, never  officially evaluated.  Patient has an ENT evaluation.  Status post biopsy- WARTHIN TUMOR.  Negative for atypia and malignancy. # He has had CRT-D implant.  # 12/19/2020 CT chest abdomen pelvis showed interval resolution of retrocaval and right iliac lymphadenotomy. No new or progressive disease. Stable sclerotic lesions. Non obstructing bilateral nephrolithiasis.  #06/14/2021, CT neck soft tissue with contrast showed multiple bilateral parotid masses, largest lesion on the right has increased the size, now 5.1 x 2.9 x 4.9 cm.  Previously 4.1 cm in 2016.  INTERVAL HISTORY Daniel Weiss is a 62 y.o. male who has above history reviewed by me today presents for follow up visit for management of metastatic prostate cancer and germ cell tumor.  He has no new complaints today.  Patient has been on Xtandi since March 2024.  He tolerates well. He reports doing well,. Tolerating treatment well. No new complaints.   Review of Systems  Constitutional:  Positive for fatigue. Negative for appetite change, chills, fever and unexpected weight change.  HENT:   Negative for hearing loss and voice change.        Chronic neck mass  Eyes:  Negative for eye problems and icterus.  Respiratory:  Negative for chest tightness, cough and shortness of breath.   Cardiovascular:  Negative for chest pain and leg swelling.  Gastrointestinal:  Negative for abdominal distention and abdominal pain.  Endocrine: Positive for hot flashes.  Genitourinary:  Negative for difficulty urinating, dysuria and frequency.   Musculoskeletal:  Negative for arthralgias.  Skin:  Negative for itching and rash.  Neurological:  Negative for dizziness, light-headedness and numbness.  Hematological:  Negative for adenopathy. Does not bruise/bleed easily.  Psychiatric/Behavioral:  Negative for confusion.     MEDICAL HISTORY:  Past Medical History:  Diagnosis Date   Abnormal chest CT 02/2018   a. Ectatic 3 cm Ao arch - rec f/u outpt imaging w/  CTA or MRA. Ectatic atheromatous abd Ao @ risk for aneurysm - rec f/u u/s in 5 yrs. Marked prostatic enlargement w/ scattered small sclerotic foci in the thoracic lower lumbar spine, sacrum, left 12th rib, pelvis, and right femur.  Recommend elective outpatient whole-body bone scintigraphy and PSA.   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 02/2018 ACS/PCI: LM nl, LAD 85p (3.5x15 Sierra DES), D1 45ost, RI 55ost, RCA nl, EF 25-35%.   Cardiac murmur    Chest pain on exertion 11/28/2019   Chronic combined systolic and diastolic CHF, NYHA class 2 (HCC) 04/17/2020   Complete left bundle branch block (  LBBB) 2016   Coronary artery disease involving native coronary artery of native heart with angina pectoris (HCC) 03/10/2018   Dyspnea    Encephalopathy, hypertensive    Essential hypertension 07/07/2014   Germ cell tumor (HCC) 12/22/2019   Goals of care, counseling/discussion 12/22/2019   Hematemesis without nausea    HFrEF (heart failure with reduced ejection fraction) (HCC)    History of CVA (cerebrovascular accident) 11/23/2018   History of non-ST elevation myocardial infarction (NSTEMI) 02/21/2019   Hypertension    Hypokalemia    Hyponatremia 06/29/2015   Ischemic cardiomyopathy 02/2018   a. 02/2018 Echo: EF 35-40% (LV gram on cath was 25-30%), mod, diff HK. mid-apicalanteroseptal, ant, and apical sev HK. RVH.-->  No notable change on echo from June 2020 and August 2020.   Localized swelling, mass or lump of neck    Long term (current) use of anticoagulants 11/16/2018   Malnutrition of moderate degree 11/11/2018   Mass of right side of neck 07/07/2014   Myocardial infarction [facility])    Neck mass    Right Neck mass - US neck showed 6.3 cm complex,  partially cystic, partially solid mass.  CT neck showed 2 cm mass from parotid gland on right, with 3x4x5cm  Possible metastatic lymphadenopathy.  we recommended following up with Dr. Jenne Pane ENT for biopsy of this.      Non-ST elevation (NSTEMI) myocardial infarction (HCC) 03/06/2018   NSTEMI (non-ST elevated myocardial infarction) (HCC) 02/2018   85% pLAD - DES PCI    Paresthesia 07/07/2014   Prostate cancer (HCC) 03/10/2018   Prostate cancer metastatic to multiple sites [facility]) 11/2018   Right sided weakness 07/07/2014   Stroke [facility])    TIA (transient ischemic attack) 2016   Warthin's tumor     SURGICAL HISTORY: Past Surgical History:  Procedure Laterality Date   BIV ICD INSERTION CRT-D N/A 06/06/2020   Procedure: BIV ICD INSERTION CRT-D;  Surgeon: Regan Lemming, MD;  Location: [facility] INVASIVE CV LAB;  Service: Cardiovascular;  Laterality: N/A;   COLONOSCOPY WITH PROPOFOL N/A 02/27/2020   Procedure: COLONOSCOPY WITH PROPOFOL;  Surgeon: Benancio Deeds, MD;  Location: [facility] ENDOSCOPY;  Service: Gastroenterology;  Laterality: N/A;   CORONARY STENT INTERVENTION N/A 03/06/2018   Procedure: CORONARY STENT INTERVENTION;  Surgeon: Marykay Lex, MD;  Location: [facility] INVASIVE CV LAB;  Service: Cardiovascular: Proximal LAD 85% stenosis (and D1): DES PCI with Xience Sierra DES 3.5 mm x 15 mm - 3.9 mm   ESOPHAGOGASTRODUODENOSCOPY (EGD) WITH PROPOFOL N/A 02/27/2020   Procedure: ESOPHAGOGASTRODUODENOSCOPY (EGD) WITH PROPOFOL;  Surgeon: Benancio Deeds, MD;  Location: [facility] ENDOSCOPY;  Service: Gastroenterology;  Laterality: N/A;   HEMOSTASIS CLIP PLACEMENT  02/27/2020   Procedure: HEMOSTASIS CLIP PLACEMENT;  Surgeon: Benancio Deeds, MD;  Location: [facility] ENDOSCOPY;  Service: Gastroenterology;;   HOT HEMOSTASIS N/A 02/27/2020   Procedure: HOT HEMOSTASIS (ARGON PLASMA COAGULATION/BICAP);  Surgeon: Benancio Deeds, MD;  Location: [facility] ENDOSCOPY;  Service: Gastroenterology;  Laterality: N/A;    INTRAOPERATIVE TRANSTHORACIC ECHOCARDIOGRAM  03/06/2018   (Peri-MI) mild reduced EF 35-40%.  Diffuse hypokinesis (severe HK of the mid-apical anteroseptal, anterior and apical myocardium consistent with LAD infarct).  Unable to assess diastolic function.  Paradoxical septal motion likely related to his LBBB.  RV hypertrophy noted.  Trivial pericardial effusion noted.    LEFT HEART CATH AND CORONARY ANGIOGRAPHY N/A 03/06/2018   Procedure: LEFT HEART CATH AND CORONARY ANGIOGRAPHY;  Surgeon: Marykay Lex, MD;  Location: [facility] INVASIVE CV LAB;  Service:  Cardiovascular: Proximal LAD 85% involving D1 45%.  Ostial RI 55%.  Severe LV dysfunction EF 25 to 30%.  1+ MR.  Only minimally elevated LVEDP.   ORCHIECTOMY Right 01/17/2020   Procedure: ORCHIECTOMY;  Surgeon: Riki Altes, MD;  Location: [facility] ORS;  Service: Urology;  Laterality: Right;   POLYPECTOMY  02/27/2020   Procedure: POLYPECTOMY;  Surgeon: Benancio Deeds, MD;  Location: [facility] ENDOSCOPY;  Service: Gastroenterology;;   PROSTATE BIOPSY N/A 01/17/2020   Procedure: BIOPSY TRANSRECTAL ULTRASONIC PROSTATE (TUBP);  Surgeon: Riki Altes, MD;  Location: [facility] ORS;  Service: Urology;  Laterality: N/A;   TRANSTHORACIC ECHOCARDIOGRAM  10/2018   (Most recent echo, to evaluate for TIA/CVA) : Moderate reduced EF 35 to 40%.  Moderate concentric LVH.  Mild dyskinesis of the apex and severe hypokinesis of mid apical anteroseptal and anterior wall.  Unable to assess RV pressures.  Aortic sclerosis with no stenosis.  No significant change seen    SOCIAL HISTORY: Social History   Socioeconomic History   Marital status: Legally Separated    Spouse name: Dorothenia   Number of children: Not on file   Years of education: Not on file   Highest education level: Not on file  Occupational History   Occupation: cook  Tobacco Use   Smoking status: Some Days    Current packs/day: 0.00    Types: Cigarettes   Smokeless tobacco: Never   Tobacco comments:     quit three days ago  Vaping Use   Vaping status: Never Used  Substance and Sexual Activity   Alcohol use: Yes    Alcohol/week: 2.0 standard drinks of alcohol    Types: 2 Cans of beer per week    Comment: 2 Beers daily.   Drug use: Yes    Frequency: 3.0 times per week    Types: Marijuana   Sexual activity: Not Currently  Other Topics Concern   Not on file  Social History Narrative   Currently not working.  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  Now has cancer.   Social Determinants of Health   Financial Resource Strain: Not on file  Food Insecurity: Not on file  Transportation Needs: No Transportation Needs (11/22/2018)   PRAPARE - Administrator, Civil Service (Medical): No    Lack of Transportation (Non-Medical): No  Physical Activity: Not on file  Stress: Not on file  Social Connections: Not on file  Intimate Partner Violence: Not on file    FAMILY HISTORY: Family History  Problem Relation Age of Onset   Stroke Mother    Hypertension Mother    Diabetes type II Mother    Leukemia Other    Breast cancer Other    Stroke Brother    CAD Neg Hx     ALLERGIES:  has No Known Allergies.  MEDICATIONS:  Current Outpatient Medications  Medication Sig Dispense Refill   clopidogrel (PLAVIX) 75 MG tablet Take 1 tablet by mouth once daily 90 tablet 3   docusate sodium (COLACE) 100 MG capsule Take 1 capsule (100 mg total) by mouth daily. 30 capsule 3   enzalutamide (XTANDI) 40 MG tablet Take 4 tablets (160 mg total) by mouth daily. 120 tablet 0   ezetimibe (ZETIA) 10 MG tablet Take 1 tablet by mouth once daily 90 tablet 0   ferrous sulfate 325 (65 FE) MG EC tablet Take 1 tablet (325 mg total) by mouth 2 (two) times  daily with a meal. 60 tablet 3   furosemide (LASIX) 20 MG tablet May take 20 mg tablet as needed for increase shortness of breath or swelling 20 tablet 6   metoprolol succinate (TOPROL-XL) 50 MG 24 hr tablet Take 1 tablet (50 mg  total) by mouth daily. Take with or immediately following a meal. 90 tablet 3   nitroGLYCERIN (NITROSTAT) 0.4 MG SL tablet Place 1 tablet (0.4 mg total) under the tongue every 5 (five) minutes as needed for chest pain. 25 tablet 3   rosuvastatin (CRESTOR) 40 MG tablet Take 1 tablet (40 mg total) by mouth daily. 90 tablet 3   sacubitril-valsartan (ENTRESTO) 97-103 MG Take 1 tablet by mouth 2 (two) times daily. 180 tablet 2   spironolactone (ALDACTONE) 25 MG tablet Take 1 tablet (25 mg total) by mouth daily. 90 tablet 3   tamsulosin (FLOMAX) 0.4 MG CAPS capsule Take 1 capsule (0.4 mg total) by mouth daily after supper. 90 capsule 3   traZODone (DESYREL) 50 MG tablet Take 0.5-1 tablets (25-50 mg total) by mouth at bedtime as needed for sleep. 30 tablet 6   venlafaxine XR (EFFEXOR XR) 37.5 MG 24 hr capsule Take 1 capsule (37.5 mg total) by mouth daily with breakfast. 30 capsule 1   VENTOLIN HFA 108 (90 Base) MCG/ACT inhaler INHALE 2 PUFFS BY MOUTH EVERY 6 HOURS AS NEEDED FOR WHEEZING OR SHORTNESS OF BREATH 18 g 0   No current facility-administered medications for this visit.   Facility-Administered Medications Ordered in Other Visits  Medication Dose Route Frequency Provider Last Rate Last Admin   leuprolide (6 Month) (ELIGARD) injection 45 mg  45 mg Subcutaneous Once Rickard Patience, MD         PHYSICAL EXAMINATION: ECOG PERFORMANCE STATUS: 1 - Symptomatic but completely ambulatory Vitals:   10/31/22 1040  BP: (!) 145/89  Pulse: 74  Resp: 18  Temp: (!) 96.1 F (35.6 C)  SpO2: 100%   Filed Weights   10/31/22 1040  Weight: 163 lb 8 oz (74.2 kg)    Physical Exam Constitutional:      General: He is not in acute distress.    Comments: Thin built.   HENT:     Head: Normocephalic and atraumatic.  Eyes:     General: No scleral icterus. Neck:     Comments: Palpable right cervical mass Cardiovascular:     Rate and Rhythm: Normal rate.  Pulmonary:     Effort: Pulmonary effort is normal. No  respiratory distress.  Abdominal:     General: Bowel sounds are normal. There is no distension.     Palpations: Abdomen is soft.  Musculoskeletal:        General: No deformity. Normal range of motion.     Cervical back: Normal range of motion and neck supple.  Skin:    General: Skin is warm and dry.     Findings: No erythema or rash.  Neurological:     Mental Status: He is alert and oriented to person, place, and time. Mental status is at baseline.     Cranial Nerves: No cranial nerve deficit.  Psychiatric:        Mood and Affect: Mood normal.     LABORATORY DATA:  I have reviewed the data as listed    Latest Ref Rng & Units 10/31/2022   10:12 AM 08/29/2022    9:47 AM 07/14/2022    8:22 AM  CBC  WBC 4.0 - 10.5 K/uL 4.3  4.6  4.3  Hemoglobin 13.0 - 17.0 g/dL 40.9  81.1  9.9   Hematocrit 39.0 - 52.0 % 35.4  33.9  31.0   Platelets 150 - 400 K/uL 280  325  317       Latest Ref Rng & Units 10/31/2022   10:12 AM 08/29/2022    9:47 AM 07/14/2022    8:22 AM  CMP  Glucose 70 - 99 mg/dL 914  96  782   BUN 8 - 23 mg/dL 11  8  13    Creatinine 0.61 - 1.24 mg/dL 9.56  2.13  0.86   Sodium 135 - 145 mmol/L 135  140  142   Potassium 3.5 - 5.1 mmol/L 3.3  3.3  3.9   Chloride 98 - 111 mmol/L 106  106  110   CO2 22 - 32 mmol/L 20  23  24    Calcium 8.9 - 10.3 mg/dL 9.5  9.3  9.3   Total Protein 6.5 - 8.1 g/dL 8.0  7.7  7.0   Total Bilirubin 0.3 - 1.2 mg/dL 0.3  0.3  0.3   Alkaline Phos 38 - 126 U/L 65  68  53   AST 15 - 41 U/L 25  18  16    ALT 0 - 44 U/L 12  10  10       RADIOGRAPHIC STUDIES: I have personally reviewed the radiological images as listed and agreed with the findings in the report. No results found.

## 2022-10-31 NOTE — Assessment & Plan Note (Signed)
PMSA showed no bone metastatic disease. Continue monitor.

## 2022-10-31 NOTE — Assessment & Plan Note (Addendum)
Iron panel shows persistent iron deficiency.  Hemoglobin has improved. Continue ferrous sulfate 325 mg twice daily

## 2022-10-31 NOTE — Assessment & Plan Note (Signed)
#  Germ cell tumor, stage II, status post radiation.  Will repeat tumor markers today, results pending.

## 2022-11-01 LAB — AFP TUMOR MARKER: AFP, Serum, Tumor Marker: 3.4 ng/mL (ref 0.0–8.4)

## 2022-11-12 ENCOUNTER — Other Ambulatory Visit: Payer: Self-pay | Admitting: *Deleted

## 2022-11-12 ENCOUNTER — Other Ambulatory Visit: Payer: Self-pay

## 2022-11-12 ENCOUNTER — Other Ambulatory Visit: Payer: Self-pay | Admitting: Cardiology

## 2022-11-12 MED ORDER — METOPROLOL SUCCINATE ER 50 MG PO TB24: 50.0000 mg | ORAL_TABLET | Freq: Every day | ORAL | 0 refills | Status: DC

## 2022-11-12 MED ORDER — EZETIMIBE 10 MG PO TABS: 10.0000 mg | ORAL_TABLET | Freq: Every day | ORAL | 0 refills | Status: DC

## 2022-11-12 MED ORDER — SPIRONOLACTONE 25 MG PO TABS: 25.0000 mg | ORAL_TABLET | Freq: Every day | ORAL | 0 refills | Status: DC

## 2022-11-12 NOTE — Telephone Encounter (Signed)
Pt's pharmacy is requesting a refill on tamsulosin. Would Dr. Herbie Baltimore like to refill this non cardiac medication? Please address

## 2022-11-17 ENCOUNTER — Other Ambulatory Visit (HOSPITAL_COMMUNITY): Payer: Self-pay

## 2022-11-17 ENCOUNTER — Other Ambulatory Visit: Payer: Self-pay | Admitting: Oncology

## 2022-11-17 DIAGNOSIS — C61 Malignant neoplasm of prostate: Secondary | ICD-10-CM

## 2022-11-18 ENCOUNTER — Other Ambulatory Visit (HOSPITAL_COMMUNITY): Payer: Self-pay

## 2022-11-18 ENCOUNTER — Other Ambulatory Visit: Payer: Self-pay

## 2022-11-18 MED ORDER — ENZALUTAMIDE 40 MG PO TABS
160.0000 mg | ORAL_TABLET | Freq: Every day | ORAL | 0 refills | Status: AC
Start: 2022-11-18 — End: ?
  Filled 2022-11-18: qty 120, 30d supply, fill #0

## 2022-11-20 ENCOUNTER — Other Ambulatory Visit: Payer: Self-pay | Admitting: Nurse Practitioner

## 2022-12-04 ENCOUNTER — Ambulatory Visit (INDEPENDENT_AMBULATORY_CARE_PROVIDER_SITE_OTHER): Payer: Medicare Other

## 2022-12-04 DIAGNOSIS — I255 Ischemic cardiomyopathy: Secondary | ICD-10-CM | POA: Diagnosis not present

## 2022-12-08 LAB — CUP PACEART REMOTE DEVICE CHECK
Battery Remaining Longevity: 49 mo
Battery Remaining Percentage: 62 %
Battery Voltage: 2.96 V
Brady Statistic AP VP Percent: 8.8 %
Brady Statistic AP VS Percent: 1 %
Brady Statistic AS VP Percent: 87 %
Brady Statistic AS VS Percent: 4.2 %
Brady Statistic RA Percent Paced: 8.9 %
Date Time Interrogation Session: 20240927163124
HighPow Impedance: 70 Ohm
Implantable Lead Connection Status: 753985
Implantable Lead Connection Status: 753985
Implantable Lead Connection Status: 753985
Implantable Lead Implant Date: 20220330
Implantable Lead Implant Date: 20220330
Implantable Lead Implant Date: 20220330
Implantable Lead Location: 753858
Implantable Lead Location: 753859
Implantable Lead Location: 753860
Implantable Pulse Generator Implant Date: 20220330
Lead Channel Impedance Value: 360 Ohm
Lead Channel Impedance Value: 430 Ohm
Lead Channel Impedance Value: 830 Ohm
Lead Channel Pacing Threshold Amplitude: 0.75 V
Lead Channel Pacing Threshold Amplitude: 0.75 V
Lead Channel Pacing Threshold Amplitude: 1.75 V
Lead Channel Pacing Threshold Pulse Width: 0.5 ms
Lead Channel Pacing Threshold Pulse Width: 0.5 ms
Lead Channel Pacing Threshold Pulse Width: 0.5 ms
Lead Channel Sensing Intrinsic Amplitude: 1.2 mV
Lead Channel Sensing Intrinsic Amplitude: 12 mV
Lead Channel Setting Pacing Amplitude: 2 V
Lead Channel Setting Pacing Amplitude: 2.5 V
Lead Channel Setting Pacing Amplitude: 3 V
Lead Channel Setting Pacing Pulse Width: 0.5 ms
Lead Channel Setting Pacing Pulse Width: 0.5 ms
Lead Channel Setting Sensing Sensitivity: 0.5 mV
Pulse Gen Serial Number: 111039291
Zone Setting Status: 755011

## 2022-12-11 ENCOUNTER — Other Ambulatory Visit: Payer: Self-pay | Admitting: Oncology

## 2022-12-11 ENCOUNTER — Other Ambulatory Visit: Payer: Self-pay

## 2022-12-11 DIAGNOSIS — C61 Malignant neoplasm of prostate: Secondary | ICD-10-CM

## 2022-12-11 MED ORDER — ENZALUTAMIDE 40 MG PO TABS
160.0000 mg | ORAL_TABLET | Freq: Every day | ORAL | 0 refills | Status: DC
Start: 2022-12-11 — End: 2022-12-26
  Filled 2022-12-12: qty 120, 30d supply, fill #0

## 2022-12-11 NOTE — Telephone Encounter (Signed)
CBC with Differential (Cancer Center Only) Order: [id number] Status: Final result     Visible to patient: No (inaccessible in MyChart)     Next appt: 12/26/2022 at 10:30 AM in Oncology ([facility])     Dx: Prostate cancer (HCC); Metastatic can...   0 Result Notes          Component Ref Range & Units 1 mo ago (10/31/22) 3 mo ago (08/29/22) 5 mo ago (07/14/22) 7 mo ago (05/06/22) 11 mo ago (12/17/21) 1 yr ago (11/20/21) 1 yr ago (11/04/21)  WBC Count 4.0 - 10.5 K/uL 4.3 4.6 4.3 4.2 4.3 5.7 6.4  RBC 4.22 - 5.81 MIL/uL 4.68 4.38 3.93 Low  4.68 5.11 5.44 4.53  Hemoglobin 13.0 - 17.0 g/dL 52.8 Low  41.3 Low  9.9 Low  CM 11.9 Low  12.8 Low  13.4 11.0 Low   HCT 39.0 - 52.0 % 35.4 Low  33.9 Low  31.0 Low  37.8 Low  38.5 Low  41.4 34.6 Low   MCV 80.0 - 100.0 fL 75.6 Low  77.4 Low  78.9 Low  80.8 75.3 Low  76.1 Low  76.4 Low   MCH 26.0 - 34.0 pg 23.7 Low  25.1 Low  25.2 Low  25.4 Low  25.0 Low  24.6 Low  24.3 Low   MCHC 30.0 - 36.0 g/dL 24.4 01.0 27.2 53.6 64.4 32.4 31.8  RDW 11.5 - 15.5 % 19.8 High  19.6 High  18.6 High  19.6 High  18.8 High  19.9 High  19.5 High   Platelet Count 150 - 400 K/uL 280 325 317 405 High  403 High  376 342  nRBC 0.0 - 0.2 % 0.0 0.0 0.0 0.0 0.0 0.0 0.0  Neutrophils Relative % % 46 56 46 50 47 45 62  Neutro Abs 1.7 - 7.7 K/uL 2.0 2.6 2.0 2.1 2.0 2.6 4.0  Lymphocytes Relative % 41 32 35 36 39 42 24  Lymphs Abs 0.7 - 4.0 K/uL 1.8 1.4 1.5 1.5 1.7 2.4 1.5  Monocytes Relative % 12 11 16 12 12 12 12   Monocytes Absolute 0.1 - 1.0 K/uL 0.5 0.5 0.7 0.5 0.5 0.7 0.8  Eosinophils Relative % 0 0 2 1 1  0 1  Eosinophils Absolute 0.0 - 0.5 K/uL 0.0 0.0 0.1 0.0 0.0 0.0 0.0  Basophils Relative % 1 1 1 1 1 1 1   Basophils Absolute 0.0 - 0.1 K/uL 0.0 0.0 0.0 0.0 0.0 0.0 0.0  Immature Granulocytes % 0 0 0 0 0 0 0  Abs Immature Granulocytes 0.00 - 0.07 K/uL 0.01 0.01 CM 0.01 CM 0.01 CM 0.01 CM 0.01 CM 0.02 CM  Comment: Performed at [facility], [address] [place], [place] [address]  Resulting Agency [facility]         Specimen Collected: 10/31/22 10:12 Last Resulted: 10/31/22 10:36      Lab Flowsheet      Order Details      View Encounter      Lab and Collection Details      Routing      Result History    View All Conversations on this Encounter      CM=Additional comments      Result Care Coordination   Patient Communication   Add Comments   Not seen Back to Top    Other Results from 10/31/2022  AFP  tumor marker Order: [id number] Status: Final result      Visible to patient: No (inaccessible in MyChart)      Next appt: 12/26/2022 at 10:30 AM in Oncology ([facility])      Dx: Other abnormal tumor markers    0 Result Notes            Component Ref Range & Units 1 mo ago (10/31/22) 3 mo ago (08/29/22) 1 yr ago (11/04/21) 1 yr ago (03/25/21) 1 yr ago (12/19/20) 2 yr ago (09/17/20) 2 yr ago (02/10/20)  AFP, Serum, Tumor Marker 0.0 - 8.4 ng/mL 3.4 3.8 CM 2.9 CM 2.6 CM 3.9 CM 3.7 CM 3.9 R, CM  Comment: (NOTE) [employer] Electrochemiluminescence Immunoassay (ECLIA) Values obtained with different assay methods or kits cannot be used interchangeably.  Results cannot be interpreted as absolute evidence of the presence or absence of malignant disease. This test is not interpretable in pregnant females. Performed At: [facility] [address] [place], [place] [id number] Jolene Schimke MD UX:[id number]  Resulting Agency [facility]         Specimen Collected: 10/31/22 10:12 Last Resulted: 11/01/22 03:36      Lab Flowsheet       Order Details       View Encounter       Lab and Collection Details       Routing       Result History     View All Conversations on this Encounter      CM=Additional comments  R=Reference range differs  from displayed range      Result Care Coordination   Patient Communication   Add Comments   Not seen Back to Top       Contains abnormal data Ferritin Order: [id number] Status: Final result      Visible to patient: No (inaccessible in MyChart)      Next appt: 12/26/2022 at 10:30 AM in Oncology ([facility])      Dx: Prostate cancer (HCC); Metastatic can...    0 Result Notes        Component Ref Range & Units 1 mo ago 5 mo ago 1 yr ago  Ferritin 24 - 336 ng/mL 4 Low  5 Low  CM 5 Low  CM  Comment: Performed at [facility], [address] [place], [place] [address]  Resulting Agency [facility] CLIN LAB [facility] LAB [facility] CLIN LAB         Specimen Collected: 10/31/22 10:12 Last Resulted: 10/31/22 11:19      Lab Flowsheet       Order Details       View Encounter       Lab and Collection Details       Routing       Result History     View All Conversations on this Encounter      CM=Additional comments      Result Care Coordination   Patient Communication   Add Comments   Not seen Back to Top       Contains abnormal data Iron and TIBC Order: [id number] Status: Final result      Visible to patient: No (inaccessible in MyChart)      Next appt: 12/26/2022 at 10:30 AM in Oncology ([facility])      Dx: Prostate cancer (HCC); Metastatic can...    0 Result Notes  Component Ref Range & Units 1 mo ago 5 mo ago 1 yr ago  Iron 45 - 182 ug/dL 30 Low  38 Low  51  TIBC 250 - 450 ug/dL 782 High  956 High  213 High   Saturation Ratios 17.9 - 39.5 % 5 Low  8 Low  10 Low   UIBC ug/dL 086 578 CM 469 CM  Comment: Performed at [facility], [address] [place], [place] [address]  Resulting Agency [facility] CLIN LAB [facility] CLIN LAB [facility] CLIN LAB         Specimen Collected: 10/31/22 10:12 Last Resulted: 10/31/22 11:19      Lab Flowsheet       Order Details       View Encounter       Lab and Collection Details       Routing        Result History     View All Conversations on this Encounter      CM=Additional comments      Result Care Coordination   Patient Communication   Add Comments   Not seen Back to Top      PSA Order: [id number] Status: Final result      Visible to patient: No (inaccessible in MyChart)      Next appt: 12/26/2022 at 10:30 AM in Oncology ([facility])      Dx: Prostate cancer (HCC); Metastatic can...    0 Result Notes            Component Ref Range & Units 1 mo ago (10/31/22) 3 mo ago (08/29/22) 5 mo ago (07/14/22) 7 mo ago (05/06/22) 11 mo ago (12/17/21) 1 yr ago (11/08/21) 1 yr ago (06/24/21)  Prostatic Specific Antigen 0.00 - 4.00 ng/mL 0.14 0.22 CM 0.41 CM 5.75 High  CM 3.27 CM 2.90 CM 1.80 CM  Comment: (NOTE) While PSA levels of <=4.00 ng/ml are reported as reference range, some men with levels below 4.00 ng/ml can have prostate cancer and many men with PSA above 4.00 ng/ml do not have prostate cancer.  Other tests such as free PSA, age specific reference ranges, PSA velocity and PSA doubling time may be helpful especially in men less than 20 years old. Performed at [facility], [address] [place], [place] [address]  Resulting Agency [facility]         Specimen Collected: 10/31/22 10:12 Last Resulted: 10/31/22 17:13      Lab Flowsheet       Order Details       View Encounter       Lab and Collection Details       Routing       Result History     View All Conversations on this Encounter      CM=Additional comments      Result Care Coordination   Patient Communication   Add Comments   Not seen Back to Top       Contains abnormal data CMP (Cancer Center only) Order: [id number] Status: Final result      Visible to patient: No (inaccessible in MyChart)      Next appt: 12/26/2022 at 10:30 AM in Oncology ([facility])      Dx: Prostate cancer  (HCC); Metastatic can...    0 Result Notes  Component Ref Range & Units 1 mo ago (10/31/22) 3 mo ago (08/29/22) 5 mo ago (07/14/22) 7 mo ago (05/06/22) 11 mo ago (12/17/21) 1 yr ago (11/20/21) 1 yr ago (11/20/21)  Sodium 135 - 145 mmol/L 135 140 142 138 136  137  Potassium 3.5 - 5.1 mmol/L 3.3 Low  3.3 Low  3.9 3.5 3.4 Low   3.5  Chloride 98 - 111 mmol/L 106 106 110 105 103  102  CO2 22 - 32 mmol/L 20 Low  23 24 25 24  21  Low   Glucose, Bld 70 - 99 mg/dL 578 High  96 CM 469 High  CM 113 High  CM 92 CM  119 High  CM  Comment: Glucose reference range applies only to samples taken after fasting for at least 8 hours.  BUN 8 - 23 mg/dL 11 8 13 13 10  12   Creatinine 0.61 - 1.24 mg/dL 6.29 5.28 4.13 2.44 0.10  1.08  Calcium 8.9 - 10.3 mg/dL 9.5 9.3 9.3 9.8 9.5  27.2  Total Protein 6.5 - 8.1 g/dL 8.0 7.7 7.0 8.5 High  8.3 High  9.4 High    Albumin 3.5 - 5.0 g/dL 4.3 4.1 3.8 4.4 4.3 4.6   AST 15 - 41 U/L 25 18 16 22 22 31    ALT 0 - 44 U/L 12 10 10 12 16 14    Alkaline Phosphatase 38 - 126 U/L 65 68 53 68 91 103   Total Bilirubin 0.3 - 1.2 mg/dL 0.3 0.3 0.3 0.6 1.0 0.9   GFR, Estimated >60 mL/min >60 >60 CM >60 CM      Comment: (NOTE) Calculated using the CKD-EPI Creatinine Equation (2021)  Anion gap 5 - 15 9 11  CM 8 CM 8 CM 9 CM  14 CM  Comment: Performed at [facility], [address] [place], [place] [address]  Resulting Agency [facility] LAB [facility]         Specimen Collected: 10/31/22 10:12 Last Resulted: 10/31/22 10:45

## 2022-12-11 NOTE — Progress Notes (Signed)
Specialty Pharmacy Refill Coordination Note  Daniel Weiss is a 62 y.o. male contacted today regarding refills of specialty medication(s) Enzalutamide   Patient requested Delivery   Delivery date: 12/18/22   Verified address: Patient address [address]  [place] [address]   Medication will be filled on 12/17/22.  Medication refill pending.

## 2022-12-12 ENCOUNTER — Other Ambulatory Visit: Payer: Self-pay

## 2022-12-12 NOTE — Progress Notes (Signed)
Remote ICD transmission.   

## 2022-12-26 ENCOUNTER — Encounter: Payer: Self-pay | Admitting: Oncology

## 2022-12-26 ENCOUNTER — Inpatient Hospital Stay: Payer: Medicare Other

## 2022-12-26 ENCOUNTER — Other Ambulatory Visit: Payer: Self-pay

## 2022-12-26 ENCOUNTER — Inpatient Hospital Stay (HOSPITAL_BASED_OUTPATIENT_CLINIC_OR_DEPARTMENT_OTHER): Payer: Medicare Other | Admitting: Oncology

## 2022-12-26 ENCOUNTER — Inpatient Hospital Stay: Payer: Medicare Other | Attending: Oncology

## 2022-12-26 VITALS — BP 156/85 | HR 83 | Temp 96.6°F | Resp 20 | Wt 178.6 lb

## 2022-12-26 DIAGNOSIS — Z79899 Other long term (current) drug therapy: Secondary | ICD-10-CM | POA: Diagnosis not present

## 2022-12-26 DIAGNOSIS — C6211 Malignant neoplasm of descended right testis: Secondary | ICD-10-CM

## 2022-12-26 DIAGNOSIS — C801 Malignant (primary) neoplasm, unspecified: Secondary | ICD-10-CM

## 2022-12-26 DIAGNOSIS — Z79818 Long term (current) use of other agents affecting estrogen receptors and estrogen levels: Secondary | ICD-10-CM

## 2022-12-26 DIAGNOSIS — C7951 Secondary malignant neoplasm of bone: Secondary | ICD-10-CM

## 2022-12-26 DIAGNOSIS — R972 Elevated prostate specific antigen [PSA]: Secondary | ICD-10-CM | POA: Diagnosis not present

## 2022-12-26 DIAGNOSIS — Z923 Personal history of irradiation: Secondary | ICD-10-CM | POA: Insufficient documentation

## 2022-12-26 DIAGNOSIS — D508 Other iron deficiency anemias: Secondary | ICD-10-CM

## 2022-12-26 DIAGNOSIS — C61 Malignant neoplasm of prostate: Secondary | ICD-10-CM

## 2022-12-26 DIAGNOSIS — IMO0001 Reserved for inherently not codable concepts without codable children: Secondary | ICD-10-CM

## 2022-12-26 LAB — CBC WITH DIFFERENTIAL (CANCER CENTER ONLY)
Abs Immature Granulocytes: 0.03 10*3/uL (ref 0.00–0.07)
Basophils Absolute: 0 10*3/uL (ref 0.0–0.1)
Basophils Relative: 1 %
Eosinophils Absolute: 0 10*3/uL (ref 0.0–0.5)
Eosinophils Relative: 1 %
HCT: 38.5 % — ABNORMAL LOW (ref 39.0–52.0)
Hemoglobin: 12.2 g/dL — ABNORMAL LOW (ref 13.0–17.0)
Immature Granulocytes: 1 %
Lymphocytes Relative: 35 %
Lymphs Abs: 1.9 10*3/uL (ref 0.7–4.0)
MCH: 23.5 pg — ABNORMAL LOW (ref 26.0–34.0)
MCHC: 31.7 g/dL (ref 30.0–36.0)
MCV: 74 fL — ABNORMAL LOW (ref 80.0–100.0)
Monocytes Absolute: 0.6 10*3/uL (ref 0.1–1.0)
Monocytes Relative: 10 %
Neutro Abs: 2.9 10*3/uL (ref 1.7–7.7)
Neutrophils Relative %: 52 %
Platelet Count: 322 10*3/uL (ref 150–400)
RBC: 5.2 MIL/uL (ref 4.22–5.81)
RDW: 20.5 % — ABNORMAL HIGH (ref 11.5–15.5)
WBC Count: 5.5 10*3/uL (ref 4.0–10.5)
nRBC: 0 % (ref 0.0–0.2)

## 2022-12-26 LAB — LACTATE DEHYDROGENASE: LDH: 118 U/L (ref 98–192)

## 2022-12-26 LAB — RETIC PANEL
Immature Retic Fract: 32.4 % — ABNORMAL HIGH (ref 2.3–15.9)
RBC.: 5.07 MIL/uL (ref 4.22–5.81)
Retic Count, Absolute: 86.7 10*3/uL (ref 19.0–186.0)
Retic Ct Pct: 1.7 % (ref 0.4–3.1)
Reticulocyte Hemoglobin: 25.6 pg — ABNORMAL LOW (ref >27.9)

## 2022-12-26 LAB — CMP (CANCER CENTER ONLY)
ALT: 11 U/L (ref 0–44)
AST: 19 U/L (ref 15–41)
Albumin: 4.3 g/dL (ref 3.5–5.0)
Alkaline Phosphatase: 78 U/L (ref 38–126)
Anion gap: 9 (ref 5–15)
BUN: 11 mg/dL (ref 8–23)
CO2: 25 mmol/L (ref 22–32)
Calcium: 9.8 mg/dL (ref 8.9–10.3)
Chloride: 103 mmol/L (ref 98–111)
Creatinine: 0.84 mg/dL (ref 0.61–1.24)
GFR, Estimated: >60 mL/min (ref >60)
Glucose, Bld: 114 mg/dL — ABNORMAL HIGH (ref 70–99)
Potassium: 4 mmol/L (ref 3.5–5.1)
Sodium: 137 mmol/L (ref 135–145)
Total Bilirubin: 0.5 mg/dL (ref 0.3–1.2)
Total Protein: 8.4 g/dL — ABNORMAL HIGH (ref 6.5–8.1)

## 2022-12-26 LAB — FERRITIN: Ferritin: 4 ng/mL — ABNORMAL LOW (ref 24–336)

## 2022-12-26 LAB — IRON AND TIBC
Iron: 50 ug/dL (ref 45–182)
Saturation Ratios: 8 % — ABNORMAL LOW (ref 17.9–39.5)
TIBC: 650 ug/dL — ABNORMAL HIGH (ref 250–450)
UIBC: 600 ug/dL

## 2022-12-26 LAB — PSA: Prostatic Specific Antigen: 0.11 ng/mL (ref 0.00–4.00)

## 2022-12-26 MED ORDER — ENZALUTAMIDE 40 MG PO TABS
160.0000 mg | ORAL_TABLET | Freq: Every day | ORAL | 2 refills | Status: DC
  Filled 2022-12-26 – 2023-01-07 (×2): qty 120, 30d supply, fill #0
  Filled 2023-02-03: qty 120, 30d supply, fill #1
  Filled 2023-03-09: qty 120, 30d supply, fill #2

## 2022-12-26 NOTE — Assessment & Plan Note (Addendum)
PMSA showed no bone metastatic disease. Continue monitor.  Awaiting dental clearance for bisphosphonate.

## 2022-12-26 NOTE — Progress Notes (Signed)
Patient state for the last three days he has been sick and in pain. Patient states he hasn't been sick or in pain since yesterday.

## 2022-12-26 NOTE — Assessment & Plan Note (Signed)
Eligard 45mg  Q6 months.  Next due arpund 01/30/23

## 2022-12-26 NOTE — Assessment & Plan Note (Addendum)
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 No RT per Dr. Rushie Chestnut On Xtandi 160mg  in end of March 2024. -PSA has decreased to 0.11 Continue Xtandi.

## 2022-12-26 NOTE — Assessment & Plan Note (Addendum)
Iron panel shows persistent iron deficiency.  Lab Results  Component Value Date   HGB 12.2 (L) 12/26/2022   TIBC 650 (H) 12/26/2022   IRONPCTSAT 8 (L) 12/26/2022   FERRITIN 4 (L) 12/26/2022     Hemoglobin has improved. Continue ferrous sulfate 325 mg twice daily He declines GI work up due to multiple medical problems.

## 2022-12-26 NOTE — Assessment & Plan Note (Signed)
#  Germ cell tumor, stage II, status post radiation.  repeat tumor markers today, results pending.

## 2022-12-26 NOTE — Progress Notes (Signed)
Hematology/Oncology Progress note [phone] [phone]   CHIEF COMPLAINTS/REASON FOR VISIT:  Follow up for prostate cancer and germ cell tumor, IDA  ASSESSMENT & PLAN:   Cancer Staging  Prostate cancer [facility]) Staging form: Prostate, AJCC 8th Edition - Clinical stage from 01/17/2020: Stage IVB (cT2c, cN0, cM1, Grade Group: 4) - Signed by Rickard Patience, MD on 03/16/2020   Prostate cancer [facility]) Metastatic castration resistant prostate cancer PMSA showed no distant metastatic disease, except intesne acitivity in prostate. No RT per Dr. Rushie Chestnut On Xtandi 160mg  in end of March 2024. -PSA has decreased to 0.11 Continue Xtandi.  IDA (iron deficiency anemia) Iron panel shows persistent iron deficiency.  Hemoglobin has improved. 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 Continue monitor.  Awaiting dental clearance for bisphosphonate.   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  repeat tumor markers today, results pending.   Androgen deprivation therapy Eligard 45mg  Q6 months.  Next due arpund 01/30/23   Orders Placed This Encounter  Procedures   PSA    Standing Status:   Future    Number of Occurrences:   1    Standing Expiration Date:   12/26/2023   CBC with Differential (Cancer Center Only)    Standing Status:   Future    Standing Expiration Date:   12/26/2023   CMP (Cancer Center only)    Standing Status:   Future    Standing Expiration Date:   12/26/2023   AFP tumor marker    Standing Status:   Future    Standing Expiration Date:   12/26/2023   Beta hCG quant (ref lab)    Standing Status:   Future    Standing Expiration Date:   12/26/2023   Lactate dehydrogenase    Standing Status:   Future    Standing Expiration Date:   12/26/2023   Iron and TIBC    Standing Status:   Future    Standing Expiration Date:   12/26/2023   Ferritin    Standing  Status:   Future    Standing Expiration Date:   12/26/2023   Retic Panel    Standing Status:   Future    Standing Expiration Date:   12/26/2023   PSA    Standing Status:   Future    Standing Expiration Date:   12/26/2023   Follow up in 3 months.  All questions were answered. The patient knows to call the clinic with any problems, questions or concerns.  Rickard Patience, MD, PhD [facility] Hematology Oncology 12/26/2022   HISTORY OF PRESENTING ILLNESS:  Oncology History  Prostate cancer [facility])  12/25/2012 Tumor Marker   PSA 0.11   03/10/2018 Initial Diagnosis   Prostate cancer [facility])  Patient was diagnosed with presumed prostate cancer on 10/2018 with a PSA of 509, incidental findings of multiple sclerotic lesions on CT-chest abdomen pelvis during work-up for a CVA.  His previous urology care was with Dr. Ronne Binning. 11/05/2018 bone scan showed solitary punctate focus of activity in the left first sacral segment corresponding to a 9 mm mixed lytic and sclerotic metastatic's on recent CT.  The remaining numerous sclerotic osseous metastasis identified on CT do not demonstrate abnormal activity and are therefore healed.  At that time No tissue diagnosis/biopsy was obtained. Patient was started with Lupron and Casodex Lupron was eventually to Eligard [facility], PSA  was 13.13, testosterone level less than 10. Casodex was discontinued in June 2021 10/26/2019 patient switched to [facility] urology and was seen by Dr. Lonna Cobb Per urology note, PSA trend: 11/03/2018: 502 11/22/2018: 377 02/21/2019: 387 04/25/2019: 123 09/05/2019: 13.3 Patient was continued on Eligard 45mg  Q6 months.    10/06/2019 Imaging   bone scan showed resolution of punctated activity over the left sacrum. The punctate area of increased activity over the right lower lumbar spine again noted.    10/31/2019 Imaging   CT angio chest aorta showed no evidence of aortic aneurysm or  dissection.  Atherosclerotic calcification spleen seen in the upper abdominal aorta.  Chronic artery disease.  No acute cardiopulmonary disease. Patient has a chronic right neck mass.  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    11/18/2019 Imaging   11/18/2019 CT abdomen pelvis with contrast showed retrocaval lymphadenopathy in the right common and external iliac chains.  Stable to minimally progressed in the interval since last CT scan 11/03/2018 Numerous sclerotic bone lesions.  Stable 3 mm subpleural right middle lobe lung nodule stable.  Aortic atherosclerosis   11/28/2019 Procedure   Paracaval lymph node biopsy showed metastatic neoplasm,consistent with metastatic germ cell tumor, morphologically compatible with seminoma, and his case was reviewed on tumor board.  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 Appearance was consistent with primary testicular neoplasm. Normal size and appearance of the left testicle    01/17/2020 Surgery   S/p right orchiectomy  residual germ cell tumor and germ cell neoplasia in situ are not identified.  Case was discussed on tumor board.  Consensus reached a point although residual germ cell tumor/infection was not identified, the presence of scar with extensive tubular atrophy is compatible with a completely regressed germ cell tumor.   He went to [facility] for second opinion as I recommended. Due to long waiting time, he left without being seen. Dr[facility]Rush from [facility] called and agrees with patient  current treatment.    03/14/2020 Imaging   PET  . - small hypermetabolic lymph nodes along the RIGHT iliac vessels is most consistent with nodal metastasis.    04/12/2020 - 05/08/2020 Radiation Therapy   radiation to periaortic lymph nodes as well as right hemipelvis node.    12/19/2020 Imaging   CT chest abdomen pelvis showed interval resolution of retrocaval and right iliac lymphadenotomy. No new or progressive disease. Stable sclerotic lesions. Non obstructing bilateral nephrolithiasis.    06/24/2021 Tumor Marker   PSA 1.80    11/08/2021 Tumor Marker   PSA 2.9    11/14/2021 Imaging   CT chest abdomen pelvis w contrast  No new or progressive findings on today's examination.   Unchanged multiple small sclerotic lesions in the thoracic and lumbar spine as well as the pelvis. Stable 4 mm right middle lobe nodule. Recommend continued attention on follow-up. Aortic Atherosclerosis   12/17/2021 Tumor Marker   PSA 3.27   05/06/2022 Tumor Marker   PSA 5.75   05/29/2022 Imaging   PMSA PET showed 1.  Intense radiotracer activity within the prostate gland consistent with primary prostate adenocarcinoma. 2. Resolution of RIGHT iliac adenopathy. 3. No evidence of metastatic adenopathy in the pelvis or periaortic retroperitoneum. 4. No evidence of visceral metastasis or skeletal metastasis. 5. Bilobed hypodense mass in the RIGHT neck is stable over multiple comparison exams including FDG PET scan and CT neck 11/03/2018   05/2022 -  Chemotherapy   Started on Xtandi 160mg  daily.    Metastatic cancer to bone [facility])     Patient has extensive cardiology issues.  He follows up with Dr. Herbie Baltimore. Patient has history of STEMI-PCI to LAD with resultant ischemia cardiomyopathy, EF 35 to 40%, left bundle branch block with acute CVA-10/2018.  CT coronary showed nonobstructing plaquing of coronary distributions, intimal irregularity with filling defect in the ascending aorta but no LV thrombus.  And the patient  is on warfarin and Plavix.  Patient was seen by thoracic surgeon Dr. Cornelius Moras will recommend patient to be on Coumadin for minimum of 3 months.  With presentation with stroke, Dr. Herbie Baltimore recommend at least 6 to 12 months of Coumadin. -Coumadin was discontinued by Dr. Herbie Baltimore in September 2021 and switched to.  Plavix Patient lives with his sister.  He has 3 adult children.  His activity is quite limited due to shortness of breath with exertion due to cardiology problems.  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  Patient has an ENT evaluation.  Status post biopsy- WARTHIN TUMOR.  Negative for atypia and malignancy. # He has had CRT-D implant.  # 12/19/2020 CT chest abdomen pelvis showed interval resolution of retrocaval and right iliac lymphadenotomy. No new or progressive disease. Stable sclerotic lesions. Non obstructing bilateral nephrolithiasis.  #06/14/2021, CT neck soft tissue with contrast showed multiple bilateral parotid masses, largest lesion on the right has increased the size, now 5.1 x 2.9 x 4.9 cm.  Previously 4.1 cm in 2016.  INTERVAL HISTORY Daniel Weiss is a 62 y.o. male who has above history reviewed by me today presents for follow up visit for management of metastatic prostate cancer and germ cell tumor.  He has no new complaints today.  Patient has been on Xtandi since March 2024.  He tolerates well. He reports doing well today. Was feeling sick for a few days recently.  Today has has no new complaints.   Review of Systems  Constitutional:  Positive for fatigue. Negative for appetite change, chills, fever and unexpected weight change.  HENT:   Negative for hearing loss and voice change.        Chronic neck mass  Eyes:  Negative for eye problems and icterus.  Respiratory:  Negative for chest tightness, cough and shortness of breath.   Cardiovascular:  Negative for chest pain and leg swelling.  Gastrointestinal:  Negative for abdominal distention and  abdominal pain.  Endocrine: Positive for hot flashes.  Genitourinary:  Negative for difficulty urinating, dysuria and frequency.   Musculoskeletal:  Negative for arthralgias.  Skin:  Negative for itching and rash.  Neurological:  Negative for dizziness, light-headedness and numbness.  Hematological:  Negative for adenopathy. Does not bruise/bleed easily.  Psychiatric/Behavioral:  Negative for confusion.     MEDICAL HISTORY:  Past Medical History:  Diagnosis Date   Abnormal chest CT 02/2018   a. Ectatic 3 cm Ao arch - rec f/u outpt imaging w/ CTA or MRA. Ectatic atheromatous abd Ao @ risk for aneurysm - rec f/u u/s in 5 yrs. Marked prostatic enlargement w/ scattered small sclerotic foci  in the thoracic lower lumbar spine, sacrum, left 12th rib, pelvis, and right femur.  Recommend elective outpatient whole-body bone scintigraphy and PSA.   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 02/2018 ACS/PCI: LM nl, LAD 85p (3.5x15 Sierra DES), D1 45ost, RI 55ost, RCA nl, EF 25-35%.   Cardiac murmur    Chest pain on exertion 11/28/2019   Chronic combined systolic and diastolic CHF, NYHA class 2 (HCC) 04/17/2020   Complete left bundle branch block (LBBB) 2016   Coronary artery disease involving native coronary artery of native heart with angina pectoris (HCC) 03/10/2018   Dyspnea    Encephalopathy, hypertensive    Essential hypertension 07/07/2014   Germ cell tumor (HCC) 12/22/2019   Goals of care, counseling/discussion 12/22/2019   Hematemesis without nausea    HFrEF (heart failure with reduced ejection fraction) (HCC)    History of CVA (cerebrovascular accident) 11/23/2018   History of non-ST elevation myocardial infarction (NSTEMI) 02/21/2019    Hypertension    Hypokalemia    Hyponatremia 06/29/2015   Ischemic cardiomyopathy 02/2018   a. 02/2018 Echo: EF 35-40% (LV gram on cath was 25-30%), mod, diff HK. mid-apicalanteroseptal, ant, and apical sev HK. RVH.-->  No notable change on echo from June 2020 and August 2020.   Localized swelling, mass or lump of neck    Long term (current) use of anticoagulants 11/16/2018   Malnutrition of moderate degree 11/11/2018   Mass of right side of neck 07/07/2014   Myocardial infarction [facility])    Neck mass    Right Neck mass - US neck showed 6.3 cm complex, partially cystic, partially solid mass.  CT neck showed 2 cm mass from parotid gland on right, with 3x4x5cm  Possible metastatic lymphadenopathy.  we recommended following up with Dr. Jenne Pane ENT for biopsy of this.      Non-ST elevation (NSTEMI) myocardial infarction (HCC) 03/06/2018   NSTEMI (non-ST elevated myocardial infarction) (HCC) 02/2018   85% pLAD - DES PCI    Paresthesia 07/07/2014   Prostate cancer (HCC) 03/10/2018   Prostate cancer metastatic to multiple sites [facility]) 11/2018   Right sided weakness 07/07/2014   Stroke [facility])    TIA (transient ischemic attack) 2016   Warthin's tumor     SURGICAL HISTORY: Past Surgical History:  Procedure Laterality Date   BIV ICD INSERTION CRT-D N/A 06/06/2020   Procedure: BIV ICD INSERTION CRT-D;  Surgeon: Regan Lemming, MD;  Location: [facility] INVASIVE CV LAB;  Service: Cardiovascular;  Laterality: N/A;   COLONOSCOPY WITH PROPOFOL N/A 02/27/2020   Procedure: COLONOSCOPY WITH PROPOFOL;  Surgeon: Benancio Deeds, MD;  Location: [facility] ENDOSCOPY;  Service: Gastroenterology;  Laterality: N/A;   CORONARY STENT INTERVENTION N/A 03/06/2018   Procedure: CORONARY STENT INTERVENTION;  Surgeon: Marykay Lex, MD;  Location: [facility] INVASIVE CV LAB;  Service: Cardiovascular: Proximal LAD 85% stenosis (and D1): DES PCI with Xience Sierra DES 3.5 mm x 15 mm - 3.9 mm   ESOPHAGOGASTRODUODENOSCOPY (EGD) WITH PROPOFOL N/A  02/27/2020   Procedure: ESOPHAGOGASTRODUODENOSCOPY (EGD) WITH PROPOFOL;  Surgeon: Benancio Deeds, MD;  Location: [facility] ENDOSCOPY;  Service: Gastroenterology;  Laterality: N/A;   HEMOSTASIS CLIP PLACEMENT  02/27/2020   Procedure: HEMOSTASIS CLIP PLACEMENT;  Surgeon: Benancio Deeds, MD;  Location: [facility] ENDOSCOPY;  Service: Gastroenterology;;   HOT HEMOSTASIS N/A 02/27/2020   Procedure: HOT HEMOSTASIS (ARGON PLASMA COAGULATION/BICAP);  Surgeon: Benancio Deeds, MD;  Location: [facility] ENDOSCOPY;  Service: Gastroenterology;  Laterality: N/A;   INTRAOPERATIVE TRANSTHORACIC ECHOCARDIOGRAM  03/06/2018   (Peri-MI) mild reduced EF 35-40%.  Diffuse hypokinesis (severe HK of the mid-apical anteroseptal, anterior and apical myocardium consistent with LAD infarct).  Unable to assess diastolic function.  Paradoxical septal motion likely related to his LBBB.  RV hypertrophy noted.  Trivial pericardial effusion noted.    LEFT HEART CATH AND CORONARY ANGIOGRAPHY N/A 03/06/2018   Procedure: LEFT HEART CATH AND CORONARY ANGIOGRAPHY;  Surgeon: Marykay Lex, MD;  Location: [facility] INVASIVE CV LAB;  Service: Cardiovascular: Proximal LAD 85% involving D1 45%.  Ostial RI 55%.  Severe LV dysfunction EF 25 to 30%.  1+ MR.  Only minimally elevated LVEDP.   ORCHIECTOMY Right 01/17/2020   Procedure: ORCHIECTOMY;  Surgeon: Riki Altes, MD;  Location: [facility] ORS;  Service: Urology;  Laterality: Right;   POLYPECTOMY  02/27/2020   Procedure: POLYPECTOMY;  Surgeon: Benancio Deeds, MD;  Location: [facility] ENDOSCOPY;  Service: Gastroenterology;;   PROSTATE BIOPSY N/A 01/17/2020   Procedure: BIOPSY TRANSRECTAL ULTRASONIC PROSTATE (TUBP);  Surgeon: Riki Altes, MD;  Location: [facility] ORS;  Service: Urology;  Laterality: N/A;   TRANSTHORACIC ECHOCARDIOGRAM  10/2018   (Most recent echo, to evaluate for TIA/CVA) : Moderate reduced EF 35 to 40%.  Moderate concentric LVH.  Mild dyskinesis of the apex and severe hypokinesis of mid  apical anteroseptal and anterior wall.  Unable to assess RV pressures.  Aortic sclerosis with no stenosis.  No significant change seen    SOCIAL HISTORY: Social History   Socioeconomic History   Marital status: Legally Separated    Spouse name: Dorothenia   Number of children: Not on file   Years of education: Not on file   Highest education level: Not on file  Occupational History   Occupation: cook  Tobacco Use   Smoking status: Some Days    Current packs/day: 0.00    Types: Cigarettes   Smokeless tobacco: Never   Tobacco comments:    quit three days ago  Vaping Use   Vaping status: Never Used  Substance and Sexual Activity   Alcohol use: Yes    Alcohol/week: 2.0 standard drinks of alcohol    Types: 2 Cans of beer per week    Comment: 2 Beers daily.   Drug use: Yes    Frequency: 3.0 times per week    Types: Marijuana   Sexual activity: Not Currently  Other Topics Concern   Not on file  Social History Narrative   Currently not working.  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  Now has cancer.   Social Determinants of Health   Financial Resource Strain: Not on file  Food Insecurity: Not on file  Transportation Needs: No Transportation Needs (11/22/2018)   PRAPARE - Administrator, Civil Service (Medical): No    Lack of Transportation (Non-Medical): No  Physical Activity: Not on file  Stress: Not on file  Social Connections: Not on file  Intimate Partner Violence: Not on file    FAMILY HISTORY: Family History  Problem Relation Age of Onset   Stroke Mother    Hypertension Mother    Diabetes type II Mother    Leukemia Other  Breast cancer Other    Stroke Brother    CAD Neg Hx     ALLERGIES:  has No Known Allergies.  MEDICATIONS:  Current Outpatient Medications  Medication Sig Dispense Refill   clopidogrel (PLAVIX) 75 MG tablet Take 1 tablet by mouth once daily 90 tablet 0   docusate sodium (COLACE) 100 MG capsule  Take 1 capsule (100 mg total) by mouth daily. 30 capsule 3   ezetimibe (ZETIA) 10 MG tablet Take 1 tablet (10 mg total) by mouth daily. 30 tablet 0   ferrous sulfate 325 (65 FE) MG EC tablet Take 1 tablet (325 mg total) by mouth 2 (two) times daily with a meal. 60 tablet 3   furosemide (LASIX) 20 MG tablet May take 20 mg tablet as needed for increase shortness of breath or swelling 20 tablet 6   metoprolol succinate (TOPROL-XL) 50 MG 24 hr tablet Take 1 tablet (50 mg total) by mouth daily. Take with or immediately following a meal.1st attempt. Patient is over due for yearly appt. Pt needs appt for any future refills. 30 tablet 0   nitroGLYCERIN (NITROSTAT) 0.4 MG SL tablet Place 1 tablet (0.4 mg total) under the tongue every 5 (five) minutes as needed for chest pain. 25 tablet 3   rosuvastatin (CRESTOR) 40 MG tablet Take 1 tablet (40 mg total) by mouth daily. 90 tablet 3   sacubitril-valsartan (ENTRESTO) 97-103 MG Take 1 tablet by mouth 2 (two) times daily. 180 tablet 2   spironolactone (ALDACTONE) 25 MG tablet Take 1 tablet (25 mg total) by mouth daily. 1st attempt. Patient is over due for yearly appt. Pt needs appt for any future refills. 30 tablet 0   tamsulosin (FLOMAX) 0.4 MG CAPS capsule Take 1 capsule (0.4 mg total) by mouth daily after supper. 90 capsule 3   traZODone (DESYREL) 50 MG tablet Take 0.5-1 tablets (25-50 mg total) by mouth at bedtime as needed for sleep. 30 tablet 6   venlafaxine XR (EFFEXOR XR) 37.5 MG 24 hr capsule Take 1 capsule (37.5 mg total) by mouth daily with breakfast. 30 capsule 1   VENTOLIN HFA 108 (90 Base) MCG/ACT inhaler INHALE 2 PUFFS BY MOUTH EVERY 6 HOURS AS NEEDED FOR WHEEZING OR SHORTNESS OF BREATH 18 g 0   enzalutamide (XTANDI) 40 MG tablet Take 4 tablets (160 mg total) by mouth daily. 120 tablet 2   No current facility-administered medications for this visit.   Facility-Administered Medications Ordered in Other Visits  Medication Dose Route Frequency  Provider Last Rate Last Admin   leuprolide (6 Month) (ELIGARD) injection 45 mg  45 mg Subcutaneous Once Rickard Patience, MD         PHYSICAL EXAMINATION: ECOG PERFORMANCE STATUS: 1 - Symptomatic but completely ambulatory Vitals:   12/26/22 1020  BP: (!) 156/85  Pulse: 83  Resp: 20  Temp: (!) 96.6 F (35.9 C)  SpO2: 100%   Filed Weights   12/26/22 1020  Weight: 178 lb 9.6 oz (81 kg)    Physical Exam Constitutional:      General: He is not in acute distress.    Comments: Thin built.   HENT:     Head: Normocephalic and atraumatic.  Eyes:     General: No scleral icterus. Neck:     Comments: Palpable right cervical mass Cardiovascular:     Rate and Rhythm: Normal rate.  Pulmonary:     Effort: Pulmonary effort is normal. No respiratory distress.  Abdominal:     General: Bowel sounds  are normal. There is no distension.     Palpations: Abdomen is soft.  Musculoskeletal:        General: No deformity. Normal range of motion.     Cervical back: Normal range of motion and neck supple.  Skin:    General: Skin is warm and dry.     Findings: No erythema or rash.  Neurological:     Mental Status: He is alert and oriented to person, place, and time. Mental status is at baseline.     Cranial Nerves: No cranial nerve deficit.  Psychiatric:        Mood and Affect: Mood normal.     LABORATORY DATA:  I have reviewed the data as listed    Latest Ref Rng & Units 12/26/2022   10:11 AM 10/31/2022   10:12 AM 08/29/2022    9:47 AM  CBC  WBC 4.0 - 10.5 K/uL 5.5  4.3  4.6   Hemoglobin 13.0 - 17.0 g/dL 16.1  09.6  04.5   Hematocrit 39.0 - 52.0 % 38.5  35.4  33.9   Platelets 150 - 400 K/uL 322  280  325       Latest Ref Rng & Units 12/26/2022   10:11 AM 10/31/2022   10:12 AM 08/29/2022    9:47 AM  CMP  Glucose 70 - 99 mg/dL 409  811  96   BUN 8 - 23 mg/dL 11  11  8    Creatinine 0.61 - 1.24 mg/dL 9.14  7.82  9.56   Sodium 135 - 145 mmol/L 137  135  140   Potassium 3.5 - 5.1 mmol/L 4.0   3.3  3.3   Chloride 98 - 111 mmol/L 103  106  106   CO2 22 - 32 mmol/L 25  20  23    Calcium 8.9 - 10.3 mg/dL 9.8  9.5  9.3   Total Protein 6.5 - 8.1 g/dL 8.4  8.0  7.7   Total Bilirubin 0.3 - 1.2 mg/dL 0.5  0.3  0.3   Alkaline Phos 38 - 126 U/L 78  65  68   AST 15 - 41 U/L 19  25  18    ALT 0 - 44 U/L 11  12  10       RADIOGRAPHIC STUDIES: I have personally reviewed the radiological images as listed and agreed with the findings in the report. CUP PACEART REMOTE DEVICE CHECK  Result Date: 12/08/2022 Scheduled remote reviewed. Normal device function.  1 NSVT, 13 SVT, no EGM's for review Next remote 91 days. [place], CVRS

## 2022-12-27 LAB — BETA HCG QUANT (REF LAB): hCG Quant: <1 m[IU]/mL (ref 0–3)

## 2022-12-27 LAB — AFP TUMOR MARKER: AFP, Serum, Tumor Marker: 3.5 ng/mL (ref 0.0–8.4)

## 2022-12-29 ENCOUNTER — Telehealth: Payer: Self-pay

## 2022-12-29 NOTE — Telephone Encounter (Signed)
Referral for pt to establish care with dentist has been faxed to  [facility].   Re: dental clearance   Ph: [phone] Fax: [phone]  Fax confirmation received.

## 2023-01-01 ENCOUNTER — Other Ambulatory Visit (HOSPITAL_COMMUNITY): Payer: Self-pay

## 2023-01-07 ENCOUNTER — Other Ambulatory Visit: Payer: Self-pay

## 2023-01-07 NOTE — Progress Notes (Signed)
Specialty Pharmacy Ongoing Clinical Assessment Note  Daniel Weiss is a 62 y.o. male who is being followed by the specialty pharmacy service for RxSp Oncology   Patient's specialty medication(s) reviewed today: Enzalutamide   Missed doses in the last 4 weeks: 0   Patient/Caregiver did not have any additional questions or concerns.   Therapeutic benefit summary: Patient is achieving benefit   Adverse events/side effects summary: No adverse events/side effects   Patient's therapy is appropriate to: Continue    Goals Addressed             This Visit's Progress    Slow Disease Progression       Patient is on track. Patient will maintain adherence. PSA remains stable at 0.11 as of 12/26/22 labs.          Follow up:  3 months  Otto Herb Specialty Pharmacist

## 2023-01-07 NOTE — Progress Notes (Signed)
Specialty Pharmacy Refill Coordination Note  Daniel Weiss is a 62 y.o. male contacted today regarding refills of specialty medication(s) Enzalutamide   Patient requested Delivery   Delivery date: 01/14/23   Verified address: [address]  [place] [address]   Medication will be filled on 01/13/23.

## 2023-01-29 ENCOUNTER — Other Ambulatory Visit (HOSPITAL_COMMUNITY): Payer: Self-pay

## 2023-01-30 ENCOUNTER — Inpatient Hospital Stay: Payer: Medicare Other

## 2023-02-03 ENCOUNTER — Other Ambulatory Visit: Payer: Self-pay

## 2023-02-03 NOTE — Progress Notes (Signed)
Specialty Pharmacy Refill Coordination Note  Daniel Weiss is a 61 y.o. male contacted today regarding refills of specialty medication(s) Enzalutamide   Patient requested Delivery   Delivery date: 02/12/23   Verified address: [address]  [place] [address]   Medication will be filled on 02/11/23.

## 2023-02-09 ENCOUNTER — Inpatient Hospital Stay: Payer: Medicare Other | Attending: Oncology

## 2023-02-09 DIAGNOSIS — C61 Malignant neoplasm of prostate: Secondary | ICD-10-CM | POA: Diagnosis present

## 2023-02-09 DIAGNOSIS — Z5111 Encounter for antineoplastic chemotherapy: Secondary | ICD-10-CM | POA: Diagnosis present

## 2023-02-09 DIAGNOSIS — C7951 Secondary malignant neoplasm of bone: Secondary | ICD-10-CM | POA: Insufficient documentation

## 2023-02-09 MED ORDER — LEUPROLIDE ACETATE (6 MONTH) 45 MG ~~LOC~~ KIT
45.0000 mg | PACK | Freq: Once | SUBCUTANEOUS | Status: AC
  Administered 2023-02-09: 45 mg via SUBCUTANEOUS
  Filled 2023-02-09: qty 45

## 2023-02-11 ENCOUNTER — Other Ambulatory Visit: Payer: Self-pay

## 2023-03-05 ENCOUNTER — Ambulatory Visit (INDEPENDENT_AMBULATORY_CARE_PROVIDER_SITE_OTHER): Payer: Medicare Other

## 2023-03-05 DIAGNOSIS — I255 Ischemic cardiomyopathy: Secondary | ICD-10-CM | POA: Diagnosis not present

## 2023-03-05 LAB — CUP PACEART REMOTE DEVICE CHECK
Battery Remaining Longevity: 46 mo
Battery Remaining Percentage: 59 %
Battery Voltage: 2.96 V
Brady Statistic AP VP Percent: 9.5 %
Brady Statistic AP VS Percent: 1 %
Brady Statistic AS VP Percent: 86 %
Brady Statistic AS VS Percent: 4.2 %
Brady Statistic RA Percent Paced: 9.5 %
Date Time Interrogation Session: 20241226010017
HighPow Impedance: 65 Ohm
Implantable Lead Connection Status: 753985
Implantable Lead Connection Status: 753985
Implantable Lead Connection Status: 753985
Implantable Lead Implant Date: 20220330
Implantable Lead Implant Date: 20220330
Implantable Lead Implant Date: 20220330
Implantable Lead Location: 753858
Implantable Lead Location: 753859
Implantable Lead Location: 753860
Implantable Pulse Generator Implant Date: 20220330
Lead Channel Impedance Value: 350 Ohm
Lead Channel Impedance Value: 390 Ohm
Lead Channel Impedance Value: 760 Ohm
Lead Channel Pacing Threshold Amplitude: 0.75 V
Lead Channel Pacing Threshold Amplitude: 0.75 V
Lead Channel Pacing Threshold Amplitude: 1.75 V
Lead Channel Pacing Threshold Pulse Width: 0.5 ms
Lead Channel Pacing Threshold Pulse Width: 0.5 ms
Lead Channel Pacing Threshold Pulse Width: 0.5 ms
Lead Channel Sensing Intrinsic Amplitude: 0.8 mV
Lead Channel Sensing Intrinsic Amplitude: 12 mV
Lead Channel Setting Pacing Amplitude: 2 V
Lead Channel Setting Pacing Amplitude: 2.5 V
Lead Channel Setting Pacing Amplitude: 3 V
Lead Channel Setting Pacing Pulse Width: 0.5 ms
Lead Channel Setting Pacing Pulse Width: 0.5 ms
Lead Channel Setting Sensing Sensitivity: 0.5 mV
Pulse Gen Serial Number: 111039291
Zone Setting Status: 755011

## 2023-03-09 ENCOUNTER — Other Ambulatory Visit (HOSPITAL_COMMUNITY): Payer: Self-pay | Admitting: Pharmacy Technician

## 2023-03-09 ENCOUNTER — Other Ambulatory Visit (HOSPITAL_COMMUNITY): Payer: Self-pay

## 2023-03-09 NOTE — Progress Notes (Signed)
Specialty Pharmacy Refill Coordination Note  Daniel Weiss is a 62 y.o. male contacted today regarding refills of specialty medication(s) Enzalutamide [name]   Patient requested Delivery   Delivery date: 03/13/23   Verified address: [address] [place]   Medication will be filled on 03/12/23.

## 2023-03-12 ENCOUNTER — Other Ambulatory Visit: Payer: Self-pay

## 2023-03-13 ENCOUNTER — Other Ambulatory Visit: Payer: Self-pay

## 2023-03-27 ENCOUNTER — Inpatient Hospital Stay: Payer: Medicare Other | Admitting: Oncology

## 2023-03-27 ENCOUNTER — Inpatient Hospital Stay: Payer: Medicare Other

## 2023-03-31 ENCOUNTER — Encounter: Payer: Self-pay | Admitting: Oncology

## 2023-04-02 ENCOUNTER — Other Ambulatory Visit: Payer: Self-pay

## 2023-04-03 ENCOUNTER — Other Ambulatory Visit: Payer: Self-pay

## 2023-04-06 ENCOUNTER — Other Ambulatory Visit: Payer: Self-pay

## 2023-04-10 ENCOUNTER — Other Ambulatory Visit: Payer: Self-pay

## 2023-04-14 ENCOUNTER — Encounter: Payer: Self-pay | Admitting: Oncology

## 2023-04-14 NOTE — Progress Notes (Signed)
 Remote ICD transmission.

## 2023-04-17 ENCOUNTER — Other Ambulatory Visit: Payer: Self-pay

## 2023-04-21 ENCOUNTER — Other Ambulatory Visit (HOSPITAL_COMMUNITY): Payer: Self-pay

## 2023-04-21 ENCOUNTER — Inpatient Hospital Stay: Payer: Medicare Other | Attending: Oncology

## 2023-04-21 ENCOUNTER — Inpatient Hospital Stay (HOSPITAL_BASED_OUTPATIENT_CLINIC_OR_DEPARTMENT_OTHER): Payer: Medicare Other | Admitting: Oncology

## 2023-04-21 ENCOUNTER — Encounter: Payer: Self-pay | Admitting: Oncology

## 2023-04-21 ENCOUNTER — Other Ambulatory Visit: Payer: Self-pay

## 2023-04-21 VITALS — BP 191/91 | HR 75 | Temp 96.0°F | Resp 18 | Wt 168.7 lb

## 2023-04-21 DIAGNOSIS — R972 Elevated prostate specific antigen [PSA]: Secondary | ICD-10-CM | POA: Insufficient documentation

## 2023-04-21 DIAGNOSIS — C61 Malignant neoplasm of prostate: Secondary | ICD-10-CM | POA: Insufficient documentation

## 2023-04-21 DIAGNOSIS — D508 Other iron deficiency anemias: Secondary | ICD-10-CM

## 2023-04-21 DIAGNOSIS — F17210 Nicotine dependence, cigarettes, uncomplicated: Secondary | ICD-10-CM | POA: Diagnosis not present

## 2023-04-21 DIAGNOSIS — C801 Malignant (primary) neoplasm, unspecified: Secondary | ICD-10-CM | POA: Diagnosis not present

## 2023-04-21 DIAGNOSIS — Z79899 Other long term (current) drug therapy: Secondary | ICD-10-CM | POA: Insufficient documentation

## 2023-04-21 DIAGNOSIS — C6211 Malignant neoplasm of descended right testis: Secondary | ICD-10-CM

## 2023-04-21 DIAGNOSIS — D509 Iron deficiency anemia, unspecified: Secondary | ICD-10-CM | POA: Insufficient documentation

## 2023-04-21 DIAGNOSIS — Z79818 Long term (current) use of other agents affecting estrogen receptors and estrogen levels: Secondary | ICD-10-CM | POA: Diagnosis not present

## 2023-04-21 DIAGNOSIS — Z8547 Personal history of malignant neoplasm of testis: Secondary | ICD-10-CM | POA: Diagnosis not present

## 2023-04-21 DIAGNOSIS — C7951 Secondary malignant neoplasm of bone: Secondary | ICD-10-CM | POA: Diagnosis present

## 2023-04-21 LAB — CMP (CANCER CENTER ONLY)
ALT: 10 U/L (ref 0–44)
AST: 16 U/L (ref 15–41)
Albumin: 3.9 g/dL (ref 3.5–5.0)
Alkaline Phosphatase: 64 U/L (ref 38–126)
Anion gap: 9 (ref 5–15)
BUN: 12 mg/dL (ref 8–23)
CO2: 25 mmol/L (ref 22–32)
Calcium: 9.4 mg/dL (ref 8.9–10.3)
Chloride: 106 mmol/L (ref 98–111)
Creatinine: 0.76 mg/dL (ref 0.61–1.24)
GFR, Estimated: >60 mL/min (ref >60)
Glucose, Bld: 104 mg/dL — ABNORMAL HIGH (ref 70–99)
Potassium: 3.7 mmol/L (ref 3.5–5.1)
Sodium: 140 mmol/L (ref 135–145)
Total Bilirubin: 0.4 mg/dL (ref 0.0–1.2)
Total Protein: 7.4 g/dL (ref 6.5–8.1)

## 2023-04-21 LAB — CBC WITH DIFFERENTIAL (CANCER CENTER ONLY)
Abs Immature Granulocytes: 0.02 10*3/uL (ref 0.00–0.07)
Basophils Absolute: 0 10*3/uL (ref 0.0–0.1)
Basophils Relative: 1 %
Eosinophils Absolute: 0 10*3/uL (ref 0.0–0.5)
Eosinophils Relative: 0 %
HCT: 33.5 % — ABNORMAL LOW (ref 39.0–52.0)
Hemoglobin: 10.7 g/dL — ABNORMAL LOW (ref 13.0–17.0)
Immature Granulocytes: 0 %
Lymphocytes Relative: 35 %
Lymphs Abs: 1.9 10*3/uL (ref 0.7–4.0)
MCH: 23.8 pg — ABNORMAL LOW (ref 26.0–34.0)
MCHC: 31.9 g/dL (ref 30.0–36.0)
MCV: 74.6 fL — ABNORMAL LOW (ref 80.0–100.0)
Monocytes Absolute: 0.7 10*3/uL (ref 0.1–1.0)
Monocytes Relative: 13 %
Neutro Abs: 2.7 10*3/uL (ref 1.7–7.7)
Neutrophils Relative %: 51 %
Platelet Count: 372 10*3/uL (ref 150–400)
RBC: 4.49 MIL/uL (ref 4.22–5.81)
RDW: 20.8 % — ABNORMAL HIGH (ref 11.5–15.5)
WBC Count: 5.3 10*3/uL (ref 4.0–10.5)
nRBC: 0 % (ref 0.0–0.2)

## 2023-04-21 LAB — RETIC PANEL
Immature Retic Fract: 32.3 % — ABNORMAL HIGH (ref 2.3–15.9)
RBC.: 4.4 MIL/uL (ref 4.22–5.81)
Retic Count, Absolute: 69.5 10*3/uL (ref 19.0–186.0)
Retic Ct Pct: 1.6 % (ref 0.4–3.1)
Reticulocyte Hemoglobin: 25.3 pg — ABNORMAL LOW (ref >27.9)

## 2023-04-21 LAB — IRON AND TIBC
Iron: 59 ug/dL (ref 45–182)
Saturation Ratios: 11 % — ABNORMAL LOW (ref 17.9–39.5)
TIBC: 550 ug/dL — ABNORMAL HIGH (ref 250–450)
UIBC: 491 ug/dL

## 2023-04-21 LAB — LACTATE DEHYDROGENASE: LDH: 115 U/L (ref 98–192)

## 2023-04-21 LAB — FERRITIN: Ferritin: 3 ng/mL — ABNORMAL LOW (ref 24–336)

## 2023-04-21 MED ORDER — ENZALUTAMIDE 40 MG PO TABS
160.0000 mg | ORAL_TABLET | Freq: Every day | ORAL | 3 refills | Status: DC
  Filled 2023-04-21 – 2023-04-28 (×2): qty 120, 30d supply, fill #0
  Filled 2023-05-22: qty 120, 30d supply, fill #1
  Filled 2023-06-11: qty 120, 30d supply, fill #2
  Filled 2023-07-06: qty 120, 30d supply, fill #3

## 2023-04-21 NOTE — Assessment & Plan Note (Addendum)
Iron panel shows persistent iron deficiency.  Lab Results  Component Value Date   HGB 10.7 (L) 04/21/2023   TIBC 550 (H) 04/21/2023   IRONPCTSAT 11 (L) 04/21/2023   FERRITIN 3 (L) 04/21/2023    Hemoglobin stable.  Iron panel is consistent with iron deficient anemia. Given that he has persistent iron deficiency anemia despite oral iron supplementation, I recommend IV Venofer weekly x 4 to maximize iron supplementation. I discussed option of IV Venofer treatments. I discussed about the potential risks including but not limited to allergic reactions/infusion reactions including anaphylactic reactions, phlebitis, high blood pressure, wheezing, SOB, skin rash, weight gain,dark urine, leg swelling, back pain, headache, nausea and fatigue, etc. patient agrees with the plan.   He declines GI work up due to multiple medical problems.  He is also on Plavix

## 2023-04-21 NOTE — Assessment & Plan Note (Addendum)
Eligard 45mg  Q6 months.  02/09/2023 next due in June 2025

## 2023-04-21 NOTE — Assessment & Plan Note (Addendum)
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 No RT per Dr. Rushie Chestnut On Xtandi 160mg  in end of March 2024. -Today's PSA is pending. Continue Xtandi.

## 2023-04-21 NOTE — Assessment & Plan Note (Signed)
#  Germ cell tumor, stage II, status post radiation.  repeat tumor markers today, results pending.

## 2023-04-21 NOTE — Assessment & Plan Note (Addendum)
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 Continue monitor.  He has not obtained dental clearance for bisphosphonate.

## 2023-04-21 NOTE — Progress Notes (Signed)
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 No RT per Dr. Rushie Chestnut On Xtandi 160mg  in end of March 2024. -Today's PSA is pending. Continue Xtandi.  Androgen deprivation therapy Eligard 45mg  Q6 months.  02/09/2023 next due in June 2025  IDA (iron deficiency anemia) Iron panel shows persistent iron deficiency.  Lab Results  Component Value Date   HGB 10.7 (L) 04/21/2023   TIBC 550 (H) 04/21/2023   IRONPCTSAT 11 (L) 04/21/2023   FERRITIN 3 (L) 04/21/2023    Hemoglobin stable.  Iron panel is consistent with iron deficient anemia. Given that he has persistent iron deficiency anemia despite oral iron supplementation, I recommend IV Venofer weekly x 4 to maximize iron supplementation. I discussed option of IV Venofer treatments. I discussed about the potential risks including but not limited to allergic reactions/infusion reactions including anaphylactic reactions, phlebitis, high blood pressure, wheezing, SOB, skin rash, weight gain,dark urine, leg swelling, back pain, headache, nausea and fatigue, etc. patient agrees with the plan.   He declines GI work up due to multiple medical problems.  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  repeat tumor markers today, results pending.   Metastatic cancer to bone [facility]) Previous PMSA showed no bone metastatic disease. Continue monitor.  He has not obtained dental clearance for  bisphosphonate.   Orders Placed This Encounter  Procedures   CBC with Differential (Cancer Center Only)    Standing Status:   Future    Expected Date:   07/19/2023    Expiration Date:   04/20/2024   CMP (Cancer Center only)    Standing Status:   Future    Expected Date:   07/19/2023    Expiration Date:   04/20/2024   PSA    Standing Status:   Future    Expected Date:   07/19/2023    Expiration Date:   04/20/2024   Iron and TIBC    Standing Status:   Future    Expected Date:   07/19/2023    Expiration Date:   04/20/2024   Ferritin    Standing Status:   Future    Expected Date:   07/19/2023    Expiration Date:   04/20/2024   Ambulatory referral to Genetics    Referral Priority:   Routine    Referral Type:   Consultation    Referral Reason:   Specialty Services Required    Number of Visits Requested:   1   Follow up in 3 months.  All questions were answered. The patient knows to call the clinic with any problems, questions or concerns.  Rickard Patience, MD, PhD [facility] Hematology Oncology 04/21/2023   HISTORY OF PRESENTING ILLNESS:  Oncology History  Prostate cancer [facility])  12/25/2012 Tumor Marker   PSA 0.11   03/10/2018 Initial Diagnosis   Prostate cancer [facility])  Patient was diagnosed with presumed prostate cancer on 10/2018 with a PSA of 509, incidental findings of multiple sclerotic lesions on CT-chest abdomen pelvis during work-up for  a CVA.  His previous urology care was with Dr. Ronne Binning. 11/05/2018 bone scan showed solitary punctate focus of activity in the left first sacral segment corresponding to a 9 mm mixed lytic and sclerotic metastatic's on recent CT.  The remaining numerous sclerotic osseous metastasis identified on CT do not demonstrate abnormal activity and are therefore healed.  At that time No tissue diagnosis/biopsy was obtained. Patient was started with Lupron and Casodex Lupron was eventually to Eligard Patient [facility], PSA was  13.13, testosterone level less than 10. Casodex was discontinued in June 2021 10/26/2019 patient switched to [facility] urology and was seen by Dr. Lonna Cobb Per urology note, PSA trend: 11/03/2018: 502 11/22/2018: 377 02/21/2019: 387 04/25/2019: 123 09/05/2019: 13.3 Patient was continued on Eligard 45mg  Q6 months.    10/06/2019 Imaging   bone scan showed resolution of punctated activity over the left sacrum. The punctate area of increased activity over the right lower lumbar spine again noted.    10/31/2019 Imaging   CT angio chest aorta showed no evidence of aortic aneurysm or dissection.  Atherosclerotic calcification spleen seen in the upper abdominal aorta.  Chronic artery disease.  No acute cardiopulmonary disease. Patient has a chronic right neck mass.  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    11/18/2019 Imaging   11/18/2019 CT abdomen pelvis with contrast showed retrocaval lymphadenopathy in the right common and external iliac chains.  Stable to minimally progressed in the interval since last CT scan 11/03/2018 Numerous sclerotic bone lesions.  Stable 3 mm subpleural right middle lobe lung nodule stable.  Aortic atherosclerosis   11/28/2019 Procedure   Paracaval lymph node biopsy showed metastatic neoplasm,consistent with metastatic germ cell tumor, morphologically compatible with seminoma, and his case was reviewed on tumor board.  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 Appearance was consistent with primary  testicular neoplasm. Normal size and appearance of the left testicle    01/17/2020 Surgery   S/p right orchiectomy  residual germ cell tumor and germ cell neoplasia in situ are not identified.  Case was discussed on tumor board.  Consensus reached a point although residual germ cell tumor/infection was not identified, the presence of scar with extensive tubular atrophy is compatible with a completely regressed germ cell tumor.   He went to [facility] for second opinion as I recommended. Due to long waiting time, he left without being seen. Dr.Rush from [facility] called and agrees with patient current treatment.    03/14/2020 Imaging   PET  . - small hypermetabolic lymph nodes along the RIGHT iliac vessels is most consistent with nodal metastasis.    04/12/2020 - 05/08/2020 Radiation Therapy   radiation to periaortic lymph nodes as well as right hemipelvis node.    12/19/2020 Imaging   CT chest abdomen pelvis showed interval resolution of retrocaval and right iliac lymphadenotomy. No new or progressive disease. Stable sclerotic lesions. Non obstructing bilateral nephrolithiasis.    06/24/2021 Tumor Marker   PSA 1.80  11/08/2021 Tumor Marker   PSA 2.9    11/14/2021 Imaging   CT chest abdomen pelvis w contrast  No new or progressive findings on today's examination.   Unchanged multiple small sclerotic lesions in the thoracic and lumbar spine as well as the pelvis. Stable 4 mm right middle lobe nodule. Recommend continued attention on follow-up. Aortic Atherosclerosis   12/17/2021 Tumor Marker   PSA 3.27   05/06/2022 Tumor Marker   PSA 5.75   05/29/2022 Imaging   PMSA PET showed 1. Intense radiotracer activity within the prostate gland consistent with primary prostate adenocarcinoma. 2. Resolution of RIGHT iliac adenopathy. 3. No evidence of metastatic adenopathy in the pelvis or periaortic retroperitoneum. 4. No evidence of visceral metastasis or skeletal metastasis. 5. Bilobed hypodense  mass in the RIGHT neck is stable over multiple comparison exams including FDG PET scan and CT neck 11/03/2018   05/2022 -  Chemotherapy   Started on Xtandi 160mg  daily.    Metastatic cancer to bone [facility])     Patient has extensive cardiology issues.  He follows up with Dr. Herbie Baltimore. Patient has history of STEMI-PCI to LAD with resultant ischemia cardiomyopathy, EF 35 to 40%, left bundle branch block with acute CVA-10/2018.  CT coronary showed nonobstructing plaquing of coronary distributions, intimal irregularity with filling defect in the ascending aorta but no LV thrombus.  And the patient is on warfarin and Plavix.  Patient was seen by thoracic surgeon Dr. Cornelius Moras will recommend patient to be on Coumadin for minimum of 3 months.  With presentation with stroke, Dr. Herbie Baltimore recommend at least 6 to 12 months of Coumadin. -Coumadin was discontinued by Dr. Herbie Baltimore in September 2021 and switched to.  Plavix Patient lives with his sister.  He has 3 adult children.  His activity is quite limited due to shortness of breath with exertion due to cardiology problems.  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  Patient has an ENT evaluation.  Status post biopsy- WARTHIN TUMOR.  Negative for atypia and malignancy. # He has had CRT-D implant.  # 12/19/2020 CT chest abdomen pelvis showed interval resolution of retrocaval and right iliac lymphadenotomy. No new or progressive disease. Stable sclerotic lesions. Non obstructing bilateral nephrolithiasis.  #06/14/2021, CT neck soft tissue with contrast showed multiple bilateral parotid masses, largest lesion on the right has increased the size, now 5.1 x 2.9 x 4.9 cm.  Previously 4.1 cm in 2016.  INTERVAL HISTORY Fawzi Melman is a 63 y.o. male who has above history reviewed by me today presents for follow up visit for management of metastatic prostate cancer and germ cell tumor.  He has no new complaints today.  Patient has been on Xtandi since  March 2024.  He tolerates well. He reports doing well today.  Denies any blood in the stool or dark stool. Today has has no new complaints.   Review of Systems  Constitutional:  Positive for fatigue. Negative for appetite change, chills, fever and unexpected weight change.  HENT:   Negative for hearing loss and voice change.        Chronic neck mass  Eyes:  Negative for eye problems and icterus.  Respiratory:  Negative for chest tightness, cough and shortness of breath.   Cardiovascular:  Negative for chest pain and leg swelling.  Gastrointestinal:  Negative for abdominal distention and abdominal pain.  Endocrine: Positive for hot flashes.  Genitourinary:  Negative for difficulty urinating, dysuria and frequency.   Musculoskeletal:  Negative for arthralgias.  Skin:  Negative for itching and rash.  Neurological:  Negative for dizziness, light-headedness and numbness.  Hematological:  Negative for adenopathy. Does not bruise/bleed easily.  Psychiatric/Behavioral:  Negative for confusion.     MEDICAL HISTORY:  Past Medical History:  Diagnosis Date   Abnormal chest CT 02/2018   a. Ectatic 3 cm Ao arch - rec f/u outpt imaging w/ CTA or MRA. Ectatic atheromatous abd Ao @ risk for aneurysm - rec f/u u/s in 5 yrs. Marked prostatic enlargement w/ scattered small sclerotic foci in the thoracic lower lumbar spine, sacrum, left 12th rib, pelvis, and right femur.  Recommend elective outpatient whole-body bone scintigraphy and PSA.   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 02/2018 ACS/PCI: LM nl, LAD 85p (3.5x15 Sierra DES), D1 45ost, RI 55ost, RCA nl, EF 25-35%.   Cardiac murmur    Chest pain on exertion 11/28/2019   Chronic combined systolic  and diastolic CHF, NYHA class 2 (HCC) 04/17/2020   Complete left bundle branch block (LBBB) 2016   Coronary artery disease involving native coronary artery of native heart with angina pectoris (HCC) 03/10/2018   Dyspnea    Encephalopathy, hypertensive    Essential hypertension 07/07/2014   Germ cell tumor (HCC) 12/22/2019   Goals of care, counseling/discussion 12/22/2019   Hematemesis without nausea    HFrEF (heart failure with reduced ejection fraction) (HCC)    History of CVA (cerebrovascular accident) 11/23/2018   History of non-ST elevation myocardial infarction (NSTEMI) 02/21/2019   Hypertension    Hypokalemia    Hyponatremia 06/29/2015   Ischemic cardiomyopathy 02/2018   a. 02/2018 Echo: EF 35-40% (LV gram on cath was 25-30%), mod, diff HK. mid-apicalanteroseptal, ant, and apical sev HK. RVH.-->  No notable change on echo from June 2020 and August 2020.   Localized swelling, mass or lump of neck    Long term (current) use of anticoagulants 11/16/2018   Malnutrition of moderate degree 11/11/2018   Mass of right side of neck 07/07/2014   Myocardial infarction [facility])    Neck mass    Right Neck mass - US neck showed 6.3 cm complex, partially cystic, partially solid mass.  CT neck showed 2 cm mass from parotid gland on right, with 3x4x5cm  Possible metastatic lymphadenopathy.  we recommended following up with Dr. Jenne Pane ENT for biopsy of this.      Non-ST elevation (NSTEMI) myocardial infarction (HCC) 03/06/2018   NSTEMI (non-ST elevated myocardial infarction) (HCC) 02/2018   85% pLAD - DES PCI    Paresthesia 07/07/2014   Prostate cancer (HCC) 03/10/2018   Prostate cancer metastatic to multiple sites [facility]) 11/2018   Right sided weakness 07/07/2014   Stroke [facility])    TIA (transient ischemic attack) 2016   Warthin's tumor     SURGICAL HISTORY: Past Surgical History:  Procedure Laterality Date   BIV ICD INSERTION CRT-D N/A 06/06/2020   Procedure: BIV ICD INSERTION CRT-D;  Surgeon: Regan Lemming, MD;  Location: [facility] INVASIVE CV LAB;  Service: Cardiovascular;  Laterality: N/A;   COLONOSCOPY WITH PROPOFOL N/A 02/27/2020   Procedure: COLONOSCOPY WITH PROPOFOL;  Surgeon: Benancio Deeds, MD;  Location: [facility] ENDOSCOPY;  Service: Gastroenterology;  Laterality: N/A;   CORONARY  STENT INTERVENTION N/A 03/06/2018   Procedure: CORONARY STENT INTERVENTION;  Surgeon: Marykay Lex, MD;  Location: [facility] INVASIVE CV LAB;  Service: Cardiovascular: Proximal LAD 85% stenosis (and D1): DES PCI with Xience Sierra DES 3.5 mm x 15 mm - 3.9 mm   ESOPHAGOGASTRODUODENOSCOPY (EGD) WITH PROPOFOL N/A 02/27/2020   Procedure: ESOPHAGOGASTRODUODENOSCOPY (EGD) WITH PROPOFOL;  Surgeon: Benancio Deeds, MD;  Location: [facility] ENDOSCOPY;  Service: Gastroenterology;  Laterality: N/A;   HEMOSTASIS CLIP PLACEMENT  02/27/2020   Procedure: HEMOSTASIS CLIP PLACEMENT;  Surgeon: Benancio Deeds, MD;  Location: [facility] ENDOSCOPY;  Service: Gastroenterology;;   HOT HEMOSTASIS N/A 02/27/2020   Procedure: HOT HEMOSTASIS (ARGON PLASMA COAGULATION/BICAP);  Surgeon: Benancio Deeds, MD;  Location: [facility] ENDOSCOPY;  Service: Gastroenterology;  Laterality: N/A;   INTRAOPERATIVE TRANSTHORACIC ECHOCARDIOGRAM  03/06/2018   (Peri-MI) mild reduced EF 35-40%.  Diffuse hypokinesis (severe HK of the mid-apical anteroseptal, anterior and apical myocardium consistent with LAD infarct).  Unable to assess diastolic function.  Paradoxical septal motion likely related to his LBBB.  RV hypertrophy noted.  Trivial pericardial effusion noted.    LEFT HEART CATH AND CORONARY ANGIOGRAPHY N/A 03/06/2018   Procedure: LEFT HEART CATH AND CORONARY ANGIOGRAPHY;  Surgeon: Marykay Lex, MD;  Location: [facility] INVASIVE CV LAB;  Service: Cardiovascular: Proximal LAD 85% involving D1 45%.  Ostial RI 55%.  Severe LV dysfunction EF 25 to 30%.  1+ MR.  Only minimally elevated LVEDP.   ORCHIECTOMY Right 01/17/2020   Procedure: ORCHIECTOMY;  Surgeon: Riki Altes,  MD;  Location: [facility] ORS;  Service: Urology;  Laterality: Right;   POLYPECTOMY  02/27/2020   Procedure: POLYPECTOMY;  Surgeon: Benancio Deeds, MD;  Location: [facility] ENDOSCOPY;  Service: Gastroenterology;;   PROSTATE BIOPSY N/A 01/17/2020   Procedure: BIOPSY TRANSRECTAL ULTRASONIC PROSTATE (TUBP);  Surgeon: Riki Altes, MD;  Location: [facility] ORS;  Service: Urology;  Laterality: N/A;   TRANSTHORACIC ECHOCARDIOGRAM  10/2018   (Most recent echo, to evaluate for TIA/CVA) : Moderate reduced EF 35 to 40%.  Moderate concentric LVH.  Mild dyskinesis of the apex and severe hypokinesis of mid apical anteroseptal and anterior wall.  Unable to assess RV pressures.  Aortic sclerosis with no stenosis.  No significant change seen    SOCIAL HISTORY: Social History   Socioeconomic History   Marital status: Legally Separated    Spouse name: Dorothenia   Number of children: Not on file   Years of education: Not on file   Highest education level: Not on file  Occupational History   Occupation: cook  Tobacco Use   Smoking status: Some Days    Current packs/day: 0.00    Types: Cigarettes   Smokeless tobacco: Never   Tobacco comments:    quit three days ago  Vaping Use   Vaping status: Never Used  Substance and Sexual Activity   Alcohol use: Yes    Alcohol/week: 2.0 standard drinks of alcohol    Types: 2 Cans of beer per week    Comment: 2 Beers daily.   Drug use: Yes    Frequency: 3.0 times per week    Types: Marijuana   Sexual activity: Not Currently  Other Topics Concern   Not on file  Social History Narrative   Currently not working.  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  Now has cancer.   Social Drivers of Corporate investment banker Strain: Not on file  Food Insecurity:  Not on file  Transportation Needs: No Transportation Needs (11/22/2018)   PRAPARE - Administrator, Civil Service (Medical): No    Lack of Transportation (Non-Medical): No   Physical Activity: Not on file  Stress: Not on file  Social Connections: Not on file  Intimate Partner Violence: Not on file    FAMILY HISTORY: Family History  Problem Relation Age of Onset   Stroke Mother    Hypertension Mother    Diabetes type II Mother    Leukemia Other    Breast cancer Other    Stroke Brother    CAD Neg Hx     ALLERGIES:  has no known allergies.  MEDICATIONS:  Current Outpatient Medications  Medication Sig Dispense Refill   clopidogrel (PLAVIX) 75 MG tablet Take 1 tablet by mouth once daily 90 tablet 0   docusate sodium (COLACE) 100 MG capsule Take 1 capsule (100 mg total) by mouth daily. 30 capsule 3   ezetimibe (ZETIA) 10 MG tablet Take 1 tablet (10 mg total) by mouth daily. 30 tablet 0   ferrous sulfate 325 (65 FE) MG EC tablet Take 1 tablet (325 mg total) by mouth 2 (two) times daily with a meal. 60 tablet 3   furosemide (LASIX) 20 MG tablet May take 20 mg tablet as needed for increase shortness of breath or swelling 20 tablet 6   metoprolol succinate (TOPROL-XL) 50 MG 24 hr tablet Take 1 tablet (50 mg total) by mouth daily. Take with or immediately following a meal.1st attempt. Patient is over due for yearly appt. Pt needs appt for any future refills. 30 tablet 0   nitroGLYCERIN (NITROSTAT) 0.4 MG SL tablet Place 1 tablet (0.4 mg total) under the tongue every 5 (five) minutes as needed for chest pain. 25 tablet 3   rosuvastatin (CRESTOR) 40 MG tablet Take 1 tablet (40 mg total) by mouth daily. 90 tablet 3   sacubitril-valsartan (ENTRESTO) 97-103 MG Take 1 tablet by mouth 2 (two) times daily. 180 tablet 2   spironolactone (ALDACTONE) 25 MG tablet Take 1 tablet (25 mg total) by mouth daily. 1st attempt. Patient is over due for yearly appt. Pt needs appt for any future refills. 30 tablet 0   tamsulosin (FLOMAX) 0.4 MG CAPS capsule Take 1 capsule (0.4 mg total) by mouth daily after supper. 90 capsule 3   traZODone (DESYREL) 50 MG tablet Take 0.5-1 tablets  (25-50 mg total) by mouth at bedtime as needed for sleep. 30 tablet 6   venlafaxine XR (EFFEXOR XR) 37.5 MG 24 hr capsule Take 1 capsule (37.5 mg total) by mouth daily with breakfast. 30 capsule 1   VENTOLIN HFA 108 (90 Base) MCG/ACT inhaler INHALE 2 PUFFS BY MOUTH EVERY 6 HOURS AS NEEDED FOR WHEEZING OR SHORTNESS OF BREATH 18 g 0   enzalutamide (XTANDI) 40 MG tablet Take 4 tablets (160 mg total) by mouth daily. 120 tablet 3   No current facility-administered medications for this visit.   Facility-Administered Medications Ordered in Other Visits  Medication Dose Route Frequency Provider Last Rate Last Admin   leuprolide (6 Month) (ELIGARD) injection 45 mg  45 mg Subcutaneous Once Rickard Patience, MD         PHYSICAL EXAMINATION: ECOG PERFORMANCE STATUS: 1 - Symptomatic but completely ambulatory Vitals:   04/21/23 1329 04/21/23 1339  BP: (!) 191/96 (!) 191/91  Pulse: 75   Resp: 18   Temp: (!) 96 F (35.6 C)   SpO2: 99%    Filed Weights  04/21/23 1329  Weight: 168 lb 11.2 oz (76.5 kg)    Physical Exam Constitutional:      General: He is not in acute distress.    Comments: Thin built.   HENT:     Head: Normocephalic and atraumatic.  Eyes:     General: No scleral icterus. Neck:     Comments: Palpable right cervical mass Cardiovascular:     Rate and Rhythm: Normal rate.  Pulmonary:     Effort: Pulmonary effort is normal. No respiratory distress.  Abdominal:     General: Bowel sounds are normal. There is no distension.     Palpations: Abdomen is soft.  Musculoskeletal:        General: No deformity. Normal range of motion.     Cervical back: Normal range of motion and neck supple.  Skin:    General: Skin is warm and dry.     Findings: No erythema or rash.  Neurological:     Mental Status: He is alert and oriented to person, place, and time. Mental status is at baseline.     Cranial Nerves: No cranial nerve deficit.  Psychiatric:        Mood and Affect: Mood normal.      LABORATORY DATA:  I have reviewed the data as listed    Latest Ref Rng & Units 04/21/2023    1:18 PM 12/26/2022   10:11 AM 10/31/2022   10:12 AM  CBC  WBC 4.0 - 10.5 K/uL 5.3  5.5  4.3   Hemoglobin 13.0 - 17.0 g/dL 28.4  13.2  44.0   Hematocrit 39.0 - 52.0 % 33.5  38.5  35.4   Platelets 150 - 400 K/uL 372  322  280       Latest Ref Rng & Units 04/21/2023    1:18 PM 12/26/2022   10:11 AM 10/31/2022   10:12 AM  CMP  Glucose 70 - 99 mg/dL 102  725  366   BUN 8 - 23 mg/dL 12  11  11    Creatinine 0.61 - 1.24 mg/dL 4.40  3.47  4.25   Sodium 135 - 145 mmol/L 140  137  135   Potassium 3.5 - 5.1 mmol/L 3.7  4.0  3.3   Chloride 98 - 111 mmol/L 106  103  106   CO2 22 - 32 mmol/L 25  25  20    Calcium 8.9 - 10.3 mg/dL 9.4  9.8  9.5   Total Protein 6.5 - 8.1 g/dL 7.4  8.4  8.0   Total Bilirubin 0.0 - 1.2 mg/dL 0.4  0.5  0.3   Alkaline Phos 38 - 126 U/L 64  78  65   AST 15 - 41 U/L 16  19  25    ALT 0 - 44 U/L 10  11  12       RADIOGRAPHIC STUDIES: I have personally reviewed the radiological images as listed and agreed with the findings in the report. No results found.

## 2023-04-22 ENCOUNTER — Telehealth: Payer: Self-pay

## 2023-04-22 LAB — PSA: Prostatic Specific Antigen: 0.05 ng/mL (ref 0.00–4.00)

## 2023-04-22 LAB — AFP TUMOR MARKER: AFP, Serum, Tumor Marker: 2.9 ng/mL (ref 0.0–8.4)

## 2023-04-22 LAB — BETA HCG QUANT (REF LAB): hCG Quant: <1 m[IU]/mL (ref 0–3)

## 2023-04-22 NOTE — Telephone Encounter (Signed)
-----   Message from Rickard Patience sent at 04/21/2023  9:24 PM EST ----- Please let patient know that he has persistent low iron level.  I recommend IV Venofer weekly x 4.  Please arrange.

## 2023-04-22 NOTE — Telephone Encounter (Signed)
Message left for pt to let him know of plan. Will call again later today.

## 2023-04-23 ENCOUNTER — Other Ambulatory Visit (HOSPITAL_COMMUNITY): Payer: Self-pay

## 2023-04-23 ENCOUNTER — Encounter: Payer: Self-pay | Admitting: Oncology

## 2023-04-23 NOTE — Telephone Encounter (Signed)
Spoke to pt and he is aware of plan. He would like infusion earlier in the day time. Can you reach out to him to r/s please

## 2023-04-28 ENCOUNTER — Other Ambulatory Visit: Payer: Self-pay

## 2023-04-28 ENCOUNTER — Other Ambulatory Visit (HOSPITAL_COMMUNITY): Payer: Self-pay

## 2023-04-28 NOTE — Progress Notes (Signed)
Specialty Pharmacy Ongoing Clinical Assessment Note  Daniel Weiss is a 63 y.o. male who is being followed by the specialty pharmacy service for RxSp Oncology   Patient's specialty medication(s) reviewed today: Enzalutamide [name]   Missed doses in the last 4 weeks: 0   Patient/Caregiver did not have any additional questions or concerns.   Therapeutic benefit summary: Patient is achieving benefit   Adverse events/side effects summary: No adverse events/side effects   Patient's therapy is appropriate to: Continue    Goals Addressed             This Visit's Progress    Slow Disease Progression   On track    Patient is on track. Patient will maintain adherence. PSA is improving and was last 0.05 ng/mL on 04/21/23.        Follow up:  3 months  Servando Snare Specialty Pharmacist

## 2023-04-28 NOTE — Progress Notes (Signed)
Specialty Pharmacy Refill Coordination Note  Artice Bergerson is a 63 y.o. male contacted today regarding refills of specialty medication(s) Enzalutamide [name]   Patient requested Delivery   Delivery date: 04/29/23   Verified address: [address] [place] [address]   Medication will be filled on 04/28/23.

## 2023-04-29 ENCOUNTER — Inpatient Hospital Stay: Payer: Medicare Other

## 2023-05-06 ENCOUNTER — Inpatient Hospital Stay: Payer: Medicare Other

## 2023-05-11 ENCOUNTER — Telehealth: Payer: Self-pay | Admitting: Oncology

## 2023-05-11 NOTE — Telephone Encounter (Signed)
 Called and left patient voicemail with appointment time change for 3/12- due to needing chair for lenghty chemo.

## 2023-05-13 ENCOUNTER — Inpatient Hospital Stay: Payer: Medicare Other | Attending: Oncology

## 2023-05-13 ENCOUNTER — Inpatient Hospital Stay: Payer: Medicare Other

## 2023-05-13 VITALS — BP 175/87 | HR 64 | Temp 96.9°F | Resp 16

## 2023-05-13 DIAGNOSIS — D509 Iron deficiency anemia, unspecified: Secondary | ICD-10-CM | POA: Insufficient documentation

## 2023-05-13 DIAGNOSIS — C61 Malignant neoplasm of prostate: Secondary | ICD-10-CM

## 2023-05-13 MED ORDER — IRON SUCROSE 20 MG/ML IV SOLN
200.0000 mg | Freq: Once | INTRAVENOUS | Status: DC
  Administered 2023-05-13: 200 mg via INTRAVENOUS
  Filled 2023-05-13: qty 10

## 2023-05-20 ENCOUNTER — Other Ambulatory Visit: Payer: Self-pay

## 2023-05-20 ENCOUNTER — Inpatient Hospital Stay: Payer: Medicare Other

## 2023-05-20 VITALS — BP 160/86 | HR 67 | Temp 97.5°F | Resp 16

## 2023-05-20 DIAGNOSIS — C61 Malignant neoplasm of prostate: Secondary | ICD-10-CM

## 2023-05-20 DIAGNOSIS — D509 Iron deficiency anemia, unspecified: Secondary | ICD-10-CM | POA: Diagnosis not present

## 2023-05-20 MED ORDER — IRON SUCROSE 20 MG/ML IV SOLN
200.0000 mg | Freq: Once | INTRAVENOUS | Status: AC
  Administered 2023-05-20: 200 mg via INTRAVENOUS
  Filled 2023-05-20: qty 10

## 2023-05-20 NOTE — Patient Instructions (Signed)

## 2023-05-22 ENCOUNTER — Other Ambulatory Visit: Payer: Self-pay

## 2023-05-22 NOTE — Progress Notes (Signed)
 Specialty Pharmacy Refill Coordination Note  Daniel Weiss is a 63 y.o. male contacted today regarding refills of specialty medication(s) Enzalutamide [name]   Patient requested Delivery   Delivery date: 05/25/23   Verified address: [address] [place] [address]   Medication will be filled on 03.14.25.

## 2023-05-27 ENCOUNTER — Inpatient Hospital Stay: Payer: Medicare Other

## 2023-05-27 VITALS — BP 151/71 | HR 80 | Temp 97.0°F | Resp 17

## 2023-05-27 DIAGNOSIS — D509 Iron deficiency anemia, unspecified: Secondary | ICD-10-CM | POA: Diagnosis not present

## 2023-05-27 DIAGNOSIS — C61 Malignant neoplasm of prostate: Secondary | ICD-10-CM

## 2023-05-27 MED ORDER — IRON SUCROSE 20 MG/ML IV SOLN
200.0000 mg | Freq: Once | INTRAVENOUS | Status: AC
  Administered 2023-05-27: 200 mg via INTRAVENOUS
  Filled 2023-05-27: qty 10

## 2023-05-27 MED ORDER — SODIUM CHLORIDE 0.9% FLUSH
10.0000 mL | Freq: Once | INTRAVENOUS | Status: AC | PRN
  Administered 2023-05-27: 10 mL
  Filled 2023-05-27: qty 10

## 2023-05-27 NOTE — Progress Notes (Signed)
 1252: Pt reports right sided chest pain "2 days ago". Pt reports he went to sleep, and chest pain had resolved when he woke up. Pt denies any chest pain at this time.  B/P 196/90. Pt denies any s/s at this time.  Dr. Cathie Hoops aware. Per Dr. Cathie Hoops pt to call his cardiologist and  if chest pain occurs again, go straight to ER. Pt aware and verbalizes understanding.   1324: B/P 172/93: per Dr. Cathie Hoops okay to proceed with Venofer.

## 2023-06-03 ENCOUNTER — Inpatient Hospital Stay: Payer: Medicare Other

## 2023-06-03 VITALS — BP 158/81 | HR 73 | Temp 96.5°F | Resp 18

## 2023-06-03 DIAGNOSIS — D509 Iron deficiency anemia, unspecified: Secondary | ICD-10-CM | POA: Diagnosis not present

## 2023-06-03 DIAGNOSIS — C61 Malignant neoplasm of prostate: Secondary | ICD-10-CM

## 2023-06-03 MED ORDER — IRON SUCROSE 20 MG/ML IV SOLN
200.0000 mg | Freq: Once | INTRAVENOUS | Status: AC
  Administered 2023-06-03: 200 mg via INTRAVENOUS
  Filled 2023-06-03: qty 10

## 2023-06-03 MED ORDER — SODIUM CHLORIDE 0.9% FLUSH
10.0000 mL | Freq: Once | INTRAVENOUS | Status: AC | PRN
  Administered 2023-06-03: 10 mL
  Filled 2023-06-03: qty 10

## 2023-06-05 ENCOUNTER — Ambulatory Visit (INDEPENDENT_AMBULATORY_CARE_PROVIDER_SITE_OTHER): Payer: Self-pay

## 2023-06-05 DIAGNOSIS — I255 Ischemic cardiomyopathy: Secondary | ICD-10-CM | POA: Diagnosis not present

## 2023-06-05 LAB — CUP PACEART REMOTE DEVICE CHECK
Battery Remaining Longevity: 42 mo
Battery Remaining Percentage: 55 %
Battery Voltage: 2.95 V
Brady Statistic AP VP Percent: 10 %
Brady Statistic AP VS Percent: 1 %
Brady Statistic AS VP Percent: 85 %
Brady Statistic AS VS Percent: 4.1 %
Brady Statistic RA Percent Paced: 10 %
Date Time Interrogation Session: 20250328020051
HighPow Impedance: 65 Ohm
Implantable Lead Connection Status: 753985
Implantable Lead Connection Status: 753985
Implantable Lead Connection Status: 753985
Implantable Lead Implant Date: 20220330
Implantable Lead Implant Date: 20220330
Implantable Lead Implant Date: 20220330
Implantable Lead Location: 753858
Implantable Lead Location: 753859
Implantable Lead Location: 753860
Implantable Pulse Generator Implant Date: 20220330
Lead Channel Impedance Value: 310 Ohm
Lead Channel Impedance Value: 350 Ohm
Lead Channel Impedance Value: 730 Ohm
Lead Channel Pacing Threshold Amplitude: 0.75 V
Lead Channel Pacing Threshold Amplitude: 0.75 V
Lead Channel Pacing Threshold Amplitude: 1.75 V
Lead Channel Pacing Threshold Pulse Width: 0.5 ms
Lead Channel Pacing Threshold Pulse Width: 0.5 ms
Lead Channel Pacing Threshold Pulse Width: 0.5 ms
Lead Channel Sensing Intrinsic Amplitude: 0.6 mV
Lead Channel Sensing Intrinsic Amplitude: 12 mV
Lead Channel Setting Pacing Amplitude: 2 V
Lead Channel Setting Pacing Amplitude: 2.5 V
Lead Channel Setting Pacing Amplitude: 3 V
Lead Channel Setting Pacing Pulse Width: 0.5 ms
Lead Channel Setting Pacing Pulse Width: 0.5 ms
Lead Channel Setting Sensing Sensitivity: 0.5 mV
Pulse Gen Serial Number: 111039291
Zone Setting Status: 755011

## 2023-06-11 ENCOUNTER — Other Ambulatory Visit: Payer: Self-pay

## 2023-06-11 ENCOUNTER — Other Ambulatory Visit: Payer: Self-pay | Admitting: Pharmacy Technician

## 2023-06-11 NOTE — Progress Notes (Signed)
 Specialty Pharmacy Refill Coordination Note  Daniel Weiss is a 63 y.o. male contacted today regarding refills of specialty medication(s) Enzalutamide [name]   Patient requested Delivery   Delivery date: 06/18/23   Verified address: [address]  [place]   Medication will be filled on 06/17/23.

## 2023-07-06 ENCOUNTER — Other Ambulatory Visit (HOSPITAL_COMMUNITY): Payer: Self-pay

## 2023-07-06 NOTE — Progress Notes (Signed)
 Specialty Pharmacy Refill Coordination Note  Daniel Weiss is a 63 y.o. male contacted today regarding refills of specialty medication(s) Enzalutamide  (XTANDI )   Patient requested Delivery   Delivery date: 07/15/23   Verified address: [address]  [place]   Medication will be filled on 07/14/23.

## 2023-07-06 NOTE — Progress Notes (Signed)
 Specialty Pharmacy Ongoing Clinical Assessment Note  Daniel Weiss is a 63 y.o. male who is being followed by the specialty pharmacy service for RxSp Oncology   Patient's specialty medication(s) reviewed today: Enzalutamide  (XTANDI )   Missed doses in the last 4 weeks: 0   Patient/Caregiver did not have any additional questions or concerns.   Therapeutic benefit summary: Patient is achieving benefit   Adverse events/side effects summary: No adverse events/side effects   Patient's therapy is appropriate to: Continue    Goals Addressed             This Visit's Progress    Slow Disease Progression   On track    Patient is on track. Patient will maintain adherence. PSA is improving and was last 0.05 ng/mL on 04/21/23.        Follow up:  6 months  Malachi Screws Specialty Pharmacist

## 2023-07-08 ENCOUNTER — Encounter: Payer: Medicare Other | Admitting: Licensed Clinical Social Worker

## 2023-07-08 ENCOUNTER — Other Ambulatory Visit: Payer: Medicare Other

## 2023-07-15 NOTE — Progress Notes (Signed)
 Remote ICD transmission.

## 2023-07-21 ENCOUNTER — Ambulatory Visit: Payer: Medicare Other | Admitting: Oncology

## 2023-07-21 ENCOUNTER — Other Ambulatory Visit: Payer: Medicare Other

## 2023-07-24 ENCOUNTER — Other Ambulatory Visit: Payer: Self-pay

## 2023-07-26 ENCOUNTER — Other Ambulatory Visit: Payer: Self-pay | Admitting: Cardiology

## 2023-07-27 ENCOUNTER — Other Ambulatory Visit: Payer: Self-pay

## 2023-07-27 MED ORDER — METOPROLOL SUCCINATE ER 50 MG PO TB24: 50.0000 mg | ORAL_TABLET | Freq: Every day | ORAL | 0 refills | Status: AC

## 2023-07-29 ENCOUNTER — Other Ambulatory Visit: Payer: Self-pay | Admitting: Nurse Practitioner

## 2023-07-30 ENCOUNTER — Other Ambulatory Visit: Payer: Self-pay | Admitting: Cardiology

## 2023-07-30 ENCOUNTER — Telehealth: Payer: Self-pay | Admitting: *Deleted

## 2023-07-30 MED ORDER — EZETIMIBE 10 MG PO TABS: 10.0000 mg | ORAL_TABLET | Freq: Every day | ORAL | 0 refills | Status: DC

## 2023-07-30 NOTE — Telephone Encounter (Signed)
 Sent in Zetia  to [facility], per pt's request. Sent encounter to Dr. Addie Holstein / Nino Bass, RN, for Flomax  refill.

## 2023-07-30 NOTE — Telephone Encounter (Signed)
 Spoke to pt and informed him that since Dr. Wilhelmenia Harada did no prescribe those medications she could not refill and he would need to reach out to the provider that prescribed them. Pt states he is good on Xtandi  and does not need a refill on that. Pt verbalized understanding.

## 2023-07-30 NOTE — Telephone Encounter (Signed)
 Speak to him and ask him which medicines he is needing to refill.  He says my water pill.  I asked him to spell it and he was talking about tamsulosin  refill.  So says he needs Ventolin  inhaler, last 1 he says   starts spina and says 4 mg I am not sure about the meds

## 2023-07-30 NOTE — Telephone Encounter (Signed)
*  STAT* If patient is at the pharmacy, call can be transferred to refill team.   1. Which medications need to be refilled? (please list name of each medication and dose if known)  tamsulosin  (FLOMAX ) 0.4 MG CAPS capsule ezetimibe  (ZETIA ) 10 MG tablet  2. Which pharmacy/location (including street and city if local pharmacy) is medication to be sent to? [facility] - [place], [place] - [address]    3. Do they need a 30 day or 90 day supply?  90 day supply

## 2023-07-31 NOTE — Telephone Encounter (Unsigned)
 RN called patient . Patient states he thinks  Dr Allyne Areola original prescribed medicaiton   RN informed patient he will need to contact to Dr Allyne Areola office for refill.  The Zetai has been sent t the pharamcy.  Patient verbalized understaanding

## 2023-08-04 ENCOUNTER — Other Ambulatory Visit (HOSPITAL_COMMUNITY): Payer: Self-pay

## 2023-08-07 ENCOUNTER — Other Ambulatory Visit (HOSPITAL_COMMUNITY): Payer: Self-pay

## 2023-08-10 ENCOUNTER — Other Ambulatory Visit: Payer: Self-pay

## 2023-08-10 ENCOUNTER — Other Ambulatory Visit: Payer: Self-pay | Admitting: Oncology

## 2023-08-10 DIAGNOSIS — C61 Malignant neoplasm of prostate: Secondary | ICD-10-CM

## 2023-08-10 NOTE — Progress Notes (Signed)
 Specialty Pharmacy Refill Coordination Note  Daniel Weiss is a 63 y.o. male contacted today regarding refills of specialty medication(s) Enzalutamide  (XTANDI )   Patient requested Delivery   Delivery date: 08/14/23   Verified address: [address]  [place]    Medication will be filled on 08/13/23.

## 2023-08-13 ENCOUNTER — Inpatient Hospital Stay (HOSPITAL_BASED_OUTPATIENT_CLINIC_OR_DEPARTMENT_OTHER): Admitting: Oncology

## 2023-08-13 ENCOUNTER — Encounter: Payer: Self-pay | Admitting: Oncology

## 2023-08-13 ENCOUNTER — Inpatient Hospital Stay: Attending: Oncology

## 2023-08-13 ENCOUNTER — Inpatient Hospital Stay

## 2023-08-13 ENCOUNTER — Other Ambulatory Visit: Payer: Self-pay | Admitting: Cardiology

## 2023-08-13 VITALS — BP 191/96 | HR 70 | Temp 97.8°F | Resp 20 | Wt 165.7 lb

## 2023-08-13 DIAGNOSIS — D509 Iron deficiency anemia, unspecified: Secondary | ICD-10-CM | POA: Diagnosis present

## 2023-08-13 DIAGNOSIS — C61 Malignant neoplasm of prostate: Secondary | ICD-10-CM

## 2023-08-13 DIAGNOSIS — Z8546 Personal history of malignant neoplasm of prostate: Secondary | ICD-10-CM | POA: Insufficient documentation

## 2023-08-13 DIAGNOSIS — C6290 Malignant neoplasm of unspecified testis, unspecified whether descended or undescended: Secondary | ICD-10-CM

## 2023-08-13 DIAGNOSIS — C801 Malignant (primary) neoplasm, unspecified: Secondary | ICD-10-CM

## 2023-08-13 DIAGNOSIS — Z79899 Other long term (current) drug therapy: Secondary | ICD-10-CM | POA: Diagnosis not present

## 2023-08-13 DIAGNOSIS — Z923 Personal history of irradiation: Secondary | ICD-10-CM | POA: Diagnosis not present

## 2023-08-13 DIAGNOSIS — F17210 Nicotine dependence, cigarettes, uncomplicated: Secondary | ICD-10-CM | POA: Diagnosis not present

## 2023-08-13 DIAGNOSIS — D508 Other iron deficiency anemias: Secondary | ICD-10-CM | POA: Diagnosis not present

## 2023-08-13 DIAGNOSIS — C7951 Secondary malignant neoplasm of bone: Secondary | ICD-10-CM | POA: Diagnosis not present

## 2023-08-13 LAB — CMP (CANCER CENTER ONLY)
ALT: 10 U/L (ref 0–44)
AST: 15 U/L (ref 15–41)
Albumin: 3.6 g/dL (ref 3.5–5.0)
Alkaline Phosphatase: 53 U/L (ref 38–126)
Anion gap: 9 (ref 5–15)
BUN: 14 mg/dL (ref 8–23)
CO2: 27 mmol/L (ref 22–32)
Calcium: 9.2 mg/dL (ref 8.9–10.3)
Chloride: 107 mmol/L (ref 98–111)
Creatinine: 0.77 mg/dL (ref 0.61–1.24)
GFR, Estimated: >60 mL/min (ref >60)
Glucose, Bld: 101 mg/dL — ABNORMAL HIGH (ref 70–99)
Potassium: 3.9 mmol/L (ref 3.5–5.1)
Sodium: 143 mmol/L (ref 135–145)
Total Bilirubin: 0.3 mg/dL (ref 0.0–1.2)
Total Protein: 6.9 g/dL (ref 6.5–8.1)

## 2023-08-13 LAB — IRON AND TIBC
Iron: 91 ug/dL (ref 45–182)
Saturation Ratios: 24 % (ref 17.9–39.5)
TIBC: 378 ug/dL (ref 250–450)
UIBC: 287 ug/dL

## 2023-08-13 LAB — CBC WITH DIFFERENTIAL (CANCER CENTER ONLY)
Abs Immature Granulocytes: 0.04 10*3/uL (ref 0.00–0.07)
Basophils Absolute: 0.1 10*3/uL (ref 0.0–0.1)
Basophils Relative: 1 %
Eosinophils Absolute: 0 10*3/uL (ref 0.0–0.5)
Eosinophils Relative: 1 %
HCT: 36.5 % — ABNORMAL LOW (ref 39.0–52.0)
Hemoglobin: 11.6 g/dL — ABNORMAL LOW (ref 13.0–17.0)
Immature Granulocytes: 1 %
Lymphocytes Relative: 29 %
Lymphs Abs: 1.6 10*3/uL (ref 0.7–4.0)
MCH: 26.1 pg (ref 26.0–34.0)
MCHC: 31.8 g/dL (ref 30.0–36.0)
MCV: 82 fL (ref 80.0–100.0)
Monocytes Absolute: 0.6 10*3/uL (ref 0.1–1.0)
Monocytes Relative: 12 %
Neutro Abs: 3.1 10*3/uL (ref 1.7–7.7)
Neutrophils Relative %: 56 %
Platelet Count: 249 10*3/uL (ref 150–400)
RBC: 4.45 MIL/uL (ref 4.22–5.81)
RDW: 18.2 % — ABNORMAL HIGH (ref 11.5–15.5)
WBC Count: 5.5 10*3/uL (ref 4.0–10.5)
nRBC: 0 % (ref 0.0–0.2)

## 2023-08-13 LAB — FERRITIN: Ferritin: 41 ng/mL (ref 24–336)

## 2023-08-13 LAB — PSA: Prostatic Specific Antigen: 0.04 ng/mL (ref 0.00–4.00)

## 2023-08-13 MED ORDER — LEUPROLIDE ACETATE (6 MONTH) 45 MG ~~LOC~~ KIT
45.0000 mg | PACK | Freq: Once | SUBCUTANEOUS | Status: AC
  Administered 2023-08-13: 45 mg via SUBCUTANEOUS
  Filled 2023-08-13: qty 45

## 2023-08-13 MED ORDER — FUROSEMIDE 20 MG PO TABS: ORAL_TABLET | ORAL | 1 refills | Status: DC

## 2023-08-13 NOTE — Assessment & Plan Note (Addendum)
 Lab Results  Component Value Date   HGB 11.6 (L) 08/13/2023   TIBC 378 08/13/2023   IRONPCTSAT 24 08/13/2023   FERRITIN 41 08/13/2023   S/p IV venofer  treatments. Hb and iron  level have both improved.   Hold off Venofer .  He declines GI work up due to multiple medical problems.  He is also on Plavix 

## 2023-08-13 NOTE — Telephone Encounter (Signed)
*  STAT* If patient is at the pharmacy, call can be transferred to refill team.   1. Which medications need to be refilled? (please list name of each medication and dose if known) VENTOLIN  HFA 108 (90 Base) MCG/ACT inhaler  furosemide  (LASIX ) 20 MG tablet    2. Would you like to learn more about the convenience, safety, & potential cost savings by using the [facility]?   3. Are you open to using the [facility] (Type [facility].  ).   4. Which pharmacy/location (including street and city if local pharmacy) is medication to be sent to? [facility] - [place], [place] - [address]    5. Do they need a 30 day or 90 day supply? 90 day

## 2023-08-13 NOTE — Telephone Encounter (Signed)
 Pt's medication was sent to pt's pharmacy as requested. Confirmation received.

## 2023-08-13 NOTE — Assessment & Plan Note (Signed)
#  Germ cell tumor, stage II, status post radiation.  repeat tumor markers

## 2023-08-13 NOTE — Assessment & Plan Note (Signed)
 Eligard  45mg  Q6 months.  Today and next due in Dec 2025

## 2023-08-13 NOTE — Addendum Note (Signed)
 Addended by: Gayleen Kawasaki D on: 08/13/2023 02:12 PM   Modules accepted: Orders

## 2023-08-13 NOTE — Assessment & Plan Note (Signed)
 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  He is not interested in dental clearance for bisphosphonate. Continue monitor.

## 2023-08-13 NOTE — Progress Notes (Signed)
 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 No RT per Dr. Jacalyn Martin On Xtandi  160mg  in end of March 2024. -Today's PSA is pending. Continue Xtandi .  Androgen deprivation therapy Eligard  45mg  Q6 months.  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  repeat tumor markers    IDA (iron  deficiency anemia) Lab Results  Component Value Date   HGB 11.6 (L) 08/13/2023   TIBC 378 08/13/2023   IRONPCTSAT 24 08/13/2023   FERRITIN 41 08/13/2023   S/p IV venofer  treatments. Hb and iron  level have both improved.   Hold off Venofer .  He declines GI work up due to multiple medical problems.  He is also on Plavix   Metastatic cancer to bone [facility]) Previous PMSA showed no bone metastatic disease.  He is not interested in dental clearance for bisphosphonate. Continue monitor.    Orders Placed This Encounter  Procedures   CMP (Cancer Center only)    Standing Status:   Future    Expected Date:   11/13/2023    Expiration Date:   08/12/2024   CBC with Differential (Cancer Center Only)    Standing Status:   Future    Expected Date:   11/13/2023    Expiration Date:   08/12/2024   PSA    Standing Status:   Future    Expected Date:   11/13/2023    Expiration Date:   08/12/2024   AFP tumor marker    Standing Status:   Future    Expected Date:   11/13/2023    Expiration Date:   08/12/2024   Lactate dehydrogenase    Standing Status:   Future     Expected Date:   11/13/2023    Expiration Date:   08/12/2024   Beta HCG, Quant (tumor marker)    Standing Status:   Future    Expected Date:   11/13/2023    Expiration Date:   08/12/2024   Follow up in 3 months.  All questions were answered. The patient knows to call the clinic with any problems, questions or concerns.  Timmy Forbes, MD, PhD [facility] Hematology Oncology 08/13/2023   HISTORY OF PRESENTING ILLNESS:  Oncology History  Prostate cancer [facility])  12/25/2012 Tumor Marker   PSA 0.11   03/10/2018 Initial Diagnosis   Prostate cancer [facility])  Patient was diagnosed with presumed prostate cancer on 10/2018 with a PSA of 509, incidental findings of multiple sclerotic lesions on CT-chest abdomen pelvis during work-up for a CVA.  His previous urology care was with Dr. Claretta Croft. 11/05/2018 bone scan showed solitary punctate focus of activity in the left first sacral segment corresponding to a 9 mm mixed lytic and sclerotic metastatic's on recent CT.  The remaining numerous sclerotic osseous metastasis identified on CT do not demonstrate abnormal activity and are therefore healed.  At that time No tissue diagnosis/biopsy was obtained. Patient was started with Lupron  and Casodex Lupron  was eventually to Eligard  [facility],  PSA was 13.13, testosterone  level less than 10. Casodex was discontinued in June 2021 10/26/2019 patient switched to [facility] urology and was seen by Dr. Cherylene Corrente Per urology note, PSA trend: 11/03/2018: 502 11/22/2018: 377 02/21/2019: 387 04/25/2019: 123 09/05/2019: 13.3 Patient was continued on Eligard  45mg  Q6 months.    10/06/2019 Imaging   bone scan showed resolution of punctated activity over the left sacrum. The punctate area of increased activity over the right lower lumbar spine again noted.    10/31/2019 Imaging   CT angio chest aorta showed no evidence of aortic aneurysm or dissection.  Atherosclerotic calcification spleen  seen in the upper abdominal aorta.  Chronic artery disease.  No acute cardiopulmonary disease. Patient has a chronic right neck mass.  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    11/18/2019 Imaging   11/18/2019 CT abdomen pelvis with contrast showed retrocaval lymphadenopathy in the right common and external iliac chains.  Stable to minimally progressed in the interval since last CT scan 11/03/2018 Numerous sclerotic bone lesions.  Stable 3 mm subpleural right middle lobe lung nodule stable.  Aortic atherosclerosis   11/28/2019 Procedure   Paracaval lymph node biopsy showed metastatic neoplasm,consistent with metastatic germ cell tumor, morphologically compatible with seminoma, and his case was reviewed on tumor board.  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 Appearance was consistent with primary testicular neoplasm. Normal size and appearance of the left testicle    01/17/2020 Surgery   S/p right orchiectomy  residual germ cell tumor and germ cell neoplasia in situ are not identified.  Case was discussed on tumor board.  Consensus reached a point although residual germ cell tumor/infection was not identified, the presence of scar with extensive tubular atrophy is compatible with a completely regressed germ cell tumor.   He went to [facility] for second opinion as I recommended. Due to long waiting time, he left without being seen. Dr.Rush from [facility] called and agrees with patient current treatment.    03/14/2020 Imaging   PET   . - small hypermetabolic lymph nodes along the RIGHT iliac vessels is most consistent with nodal metastasis.    04/12/2020 - 05/08/2020 Radiation Therapy   radiation to periaortic lymph nodes as well as right hemipelvis node.    12/19/2020 Imaging   CT chest abdomen pelvis showed interval resolution of retrocaval and right iliac lymphadenotomy. No new or progressive disease. Stable sclerotic lesions. Non obstructing bilateral nephrolithiasis.    06/24/2021 Tumor Marker   PSA 1.80    11/08/2021 Tumor Marker   PSA 2.9    11/14/2021 Imaging   CT chest abdomen pelvis w contrast  No new or progressive findings on today's examination.   Unchanged multiple small sclerotic lesions in the thoracic and lumbar spine as well as the pelvis. Stable 4 mm right middle lobe nodule. Recommend continued attention on follow-up. Aortic Atherosclerosis   12/17/2021 Tumor Marker   PSA 3.27   05/06/2022 Tumor Marker   PSA 5.75   05/29/2022 Imaging   PMSA PET showed  1. Intense radiotracer activity within the prostate gland consistent with primary prostate adenocarcinoma. 2. Resolution of RIGHT iliac adenopathy. 3. No evidence of metastatic adenopathy in the pelvis or periaortic retroperitoneum. 4. No evidence of visceral metastasis or skeletal metastasis. 5. Bilobed hypodense mass in the RIGHT neck is stable over multiple comparison exams including FDG PET scan and CT neck 11/03/2018   05/2022 -  Chemotherapy   Started on Xtandi  160mg  daily.    Metastatic cancer to bone [facility])     Patient has extensive cardiology issues.  He follows up with Dr. Addie Holstein. Patient has history of STEMI-PCI to LAD with resultant ischemia cardiomyopathy, EF 35 to 40%, left bundle branch block with acute CVA-10/2018.  CT coronary showed nonobstructing plaquing of coronary distributions, intimal irregularity with filling defect in the ascending aorta but no LV thrombus.  And the patient is on warfarin and Plavix .  Patient was seen by  thoracic surgeon Dr. Alva Jewels will recommend patient to be on Coumadin  for minimum of 3 months.  With presentation with stroke, Dr. Addie Holstein recommend at least 6 to 12 months of Coumadin . -Coumadin  was discontinued by Dr. Addie Holstein in September 2021 and switched to.  Plavix  Patient lives with his sister.  He has 3 adult children.  His activity is quite limited due to shortness of breath with exertion due to cardiology problems.  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  Patient has an ENT evaluation.  Status post biopsy- WARTHIN TUMOR.  Negative for atypia and malignancy. # He has had CRT-D implant.  # 12/19/2020 CT chest abdomen pelvis showed interval resolution of retrocaval and right iliac lymphadenotomy. No new or progressive disease. Stable sclerotic lesions. Non obstructing bilateral nephrolithiasis.  #06/14/2021, CT neck soft tissue with contrast showed multiple bilateral parotid masses, largest lesion on the right has increased the size, now 5.1 x 2.9 x 4.9 cm.  Previously 4.1 cm in 2016.  INTERVAL HISTORY Daniel Weiss is a 63 y.o. male who has above history reviewed by me today presents for follow up visit for management of metastatic prostate cancer and germ cell tumor.  He has no new complaints today.  Patient has been on Xtandi  since March 2024.  He tolerates well. He reports doing well today.  Denies any blood in the stool or dark stool. Today has has no new complaints.   Review of Systems  Constitutional:  Positive for fatigue. Negative for appetite change, chills, fever and unexpected weight change.  HENT:   Negative for hearing loss and voice change.        Chronic neck mass  Eyes:  Negative for eye problems and icterus.  Respiratory:  Negative for chest tightness, cough and shortness of breath.   Cardiovascular:  Negative for chest pain and leg swelling.  Gastrointestinal:  Negative for abdominal distention and abdominal pain.  Endocrine: Positive for hot  flashes.  Genitourinary:  Negative for difficulty urinating, dysuria and frequency.   Musculoskeletal:  Negative for arthralgias.  Skin:  Negative for itching and rash.  Neurological:  Negative for dizziness, light-headedness and numbness.  Hematological:  Negative for adenopathy. Does not bruise/bleed easily.  Psychiatric/Behavioral:  Negative for confusion.     MEDICAL HISTORY:  Past Medical History:  Diagnosis Date   Abnormal chest CT 02/2018   a. Ectatic 3 cm Ao arch - rec f/u outpt imaging w/ CTA or MRA. Ectatic atheromatous abd Ao @ risk for aneurysm - rec f/u u/s in 5 yrs. Marked prostatic enlargement w/ scattered small  sclerotic foci in the thoracic lower lumbar spine, sacrum, left 12th rib, pelvis, and right femur.  Recommend elective outpatient whole-body bone scintigraphy and PSA.   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 02/2018 ACS/PCI: LM nl, LAD 85p (3.5x15 Sierra DES), D1 45ost, RI 55ost, RCA nl, EF 25-35%.   Cardiac murmur    Chest pain on exertion 11/28/2019   Chronic combined systolic and diastolic CHF, NYHA class 2 (HCC) 04/17/2020   Complete left bundle branch block (LBBB) 2016   Coronary artery disease involving native coronary artery of native heart with angina pectoris (HCC) 03/10/2018   Dyspnea    Encephalopathy, hypertensive    Essential hypertension 07/07/2014   Germ cell tumor (HCC) 12/22/2019   Goals of care, counseling/discussion 12/22/2019   Hematemesis without nausea    HFrEF (heart failure with reduced ejection fraction) (HCC)    History of CVA (cerebrovascular accident) 11/23/2018   History of non-ST elevation myocardial infarction (NSTEMI) 02/21/2019   Hypertension    Hypokalemia    Hyponatremia  06/29/2015   Ischemic cardiomyopathy 02/2018   a. 02/2018 Echo: EF 35-40% (LV gram on cath was 25-30%), mod, diff HK. mid-apicalanteroseptal, ant, and apical sev HK. RVH.-->  No notable change on echo from June 2020 and August 2020.   Localized swelling, mass or lump of neck    Long term (current) use of anticoagulants 11/16/2018   Malnutrition of moderate degree 11/11/2018   Mass of right side of neck 07/07/2014   Myocardial infarction [facility]    Neck mass    Right Neck mass - US  neck showed 6.3 cm complex, partially cystic, partially solid mass.  CT neck showed 2 cm mass from parotid gland on right, with 3x4x5cm  Possible metastatic lymphadenopathy.  we recommended following up with Dr. Tellis Feathers ENT for biopsy of this.      Non-ST elevation (NSTEMI) myocardial infarction (HCC) 03/06/2018   NSTEMI (non-ST elevated myocardial infarction) (HCC) 02/2018   85% pLAD - DES PCI    Paresthesia 07/07/2014   Prostate cancer (HCC) 03/10/2018   Prostate cancer metastatic to multiple sites [facility]) 11/2018   Right sided weakness 07/07/2014   Stroke [facility])    TIA (transient ischemic attack) 2016   Warthin's tumor     SURGICAL HISTORY: Past Surgical History:  Procedure Laterality Date   BIV ICD INSERTION CRT-D N/A 06/06/2020   Procedure: BIV ICD INSERTION CRT-D;  Surgeon: Lei Pump, MD;  Location: [facility] INVASIVE CV LAB;  Service: Cardiovascular;  Laterality: N/A;   COLONOSCOPY WITH PROPOFOL  N/A 02/27/2020   Procedure: COLONOSCOPY WITH PROPOFOL ;  Surgeon: Ace Holder, MD;  Location: [facility] ENDOSCOPY;  Service: Gastroenterology;  Laterality: N/A;   CORONARY STENT INTERVENTION N/A 03/06/2018   Procedure: CORONARY STENT INTERVENTION;  Surgeon: Arleen Lacer, MD;  Location: [facility] INVASIVE CV LAB;  Service: Cardiovascular: Proximal LAD 85% stenosis (and D1): DES PCI with Xience Sierra DES 3.5 mm x 15 mm - 3.9 mm   ESOPHAGOGASTRODUODENOSCOPY (EGD) WITH PROPOFOL  N/A 02/27/2020   Procedure:  ESOPHAGOGASTRODUODENOSCOPY (EGD) WITH PROPOFOL ;  Surgeon: Ace Holder, MD;  Location: [facility] ENDOSCOPY;  Service: Gastroenterology;  Laterality: N/A;   HEMOSTASIS CLIP PLACEMENT  02/27/2020   Procedure: HEMOSTASIS  CLIP PLACEMENT;  Surgeon: Ace Holder, MD;  Location: [facility] ENDOSCOPY;  Service: Gastroenterology;;   HOT HEMOSTASIS N/A 02/27/2020   Procedure: HOT HEMOSTASIS (ARGON PLASMA COAGULATION/BICAP);  Surgeon: Ace Holder, MD;  Location: [facility] ENDOSCOPY;  Service: Gastroenterology;  Laterality: N/A;   INTRAOPERATIVE TRANSTHORACIC ECHOCARDIOGRAM  03/06/2018   (Peri-MI) mild reduced EF 35-40%.  Diffuse hypokinesis (severe HK of the mid-apical anteroseptal, anterior and apical myocardium consistent with LAD infarct).  Unable to assess diastolic function.  Paradoxical septal motion likely related to his LBBB.  RV hypertrophy noted.  Trivial pericardial effusion noted.    LEFT HEART CATH AND CORONARY ANGIOGRAPHY N/A 03/06/2018   Procedure: LEFT HEART CATH AND CORONARY ANGIOGRAPHY;  Surgeon: Arleen Lacer, MD;  Location: [facility] INVASIVE CV LAB;  Service: Cardiovascular: Proximal LAD 85% involving D1 45%.  Ostial RI 55%.  Severe LV dysfunction EF 25 to 30%.  1+ MR.  Only minimally elevated LVEDP.   ORCHIECTOMY Right 01/17/2020   Procedure: ORCHIECTOMY;  Surgeon: Geraline Knapp, MD;  Location: [facility] ORS;  Service: Urology;  Laterality: Right;   POLYPECTOMY  02/27/2020   Procedure: POLYPECTOMY;  Surgeon: Ace Holder, MD;  Location: [facility] ENDOSCOPY;  Service: Gastroenterology;;   PROSTATE BIOPSY N/A 01/17/2020   Procedure: BIOPSY TRANSRECTAL ULTRASONIC PROSTATE (TUBP);  Surgeon: Geraline Knapp, MD;  Location: [facility] ORS;  Service: Urology;  Laterality: N/A;   TRANSTHORACIC ECHOCARDIOGRAM  10/2018   (Most recent echo, to evaluate for TIA/CVA) : Moderate reduced EF 35 to 40%.  Moderate concentric LVH.  Mild dyskinesis of the apex and severe hypokinesis of mid apical anteroseptal and  anterior wall.  Unable to assess RV pressures.  Aortic sclerosis with no stenosis.  No significant change seen    SOCIAL HISTORY: Social History   Socioeconomic History   Marital status: Legally Separated    Spouse name: Dorothenia   Number of children: Not on file   Years of education: Not on file   Highest education level: Not on file  Occupational History   Occupation: cook  Tobacco Use   Smoking status: Some Days    Current packs/day: 0.00    Types: Cigarettes   Smokeless tobacco: Never   Tobacco comments:    quit three days ago  Vaping Use   Vaping status: Never Used  Substance and Sexual Activity   Alcohol use: Yes    Alcohol/week: 2.0 standard drinks of alcohol    Types: 2 Cans of beer per week    Comment: 2 Beers daily.   Drug use: Yes    Frequency: 3.0 times per week    Types: Marijuana   Sexual activity: Not Currently  Other Topics Concern   Not on file  Social History Narrative   Currently not working.  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  Now has cancer.   Social Drivers of Corporate investment banker Strain: Low Risk  (08/13/2023)   Overall Financial Resource Strain (CARDIA)    Difficulty of Paying Living Expenses: Not very hard  Food Insecurity: No Food Insecurity (08/13/2023)   Hunger Vital Sign    Worried About Running Out of Food in the Last Year: Never true    Ran Out of Food in the Last Year: Never true  Transportation Needs: No Transportation Needs (08/13/2023)   PRAPARE - Administrator, Civil Service (Medical): No    Lack of Transportation (Non-Medical): No  Physical Activity:  Inactive (08/13/2023)   Exercise Vital Sign    Days of Exercise per Week: 0 days    Minutes of Exercise per Session: 0 min  Stress: No Stress Concern Present (08/13/2023)   [employer] of Occupational Health - Occupational Stress Questionnaire    Feeling of Stress : Not at all  Social Connections: Unknown (08/13/2023)   Social  Connection and Isolation Panel [NHANES]    Frequency of Communication with Friends and Family: More than three times a week    Frequency of Social Gatherings with Friends and Family: More than three times a week    Attends Religious Services: More than 4 times per year    Active Member of [employer] or Organizations: No    Attends Banker Meetings: Never    Marital Status: Patient declined  Catering manager Violence: Not At Risk (08/13/2023)   Humiliation, Afraid, Rape, and Kick questionnaire    Fear of Current or Ex-Partner: No    Emotionally Abused: No    Physically Abused: No    Sexually Abused: No    FAMILY HISTORY: Family History  Problem Relation Age of Onset   Stroke Mother    Hypertension Mother    Diabetes type II Mother    Leukemia Other    Breast cancer Other    Stroke Brother    CAD Neg Hx     ALLERGIES:  has no known allergies.  MEDICATIONS:  Current Outpatient Medications  Medication Sig Dispense Refill   clopidogrel  (PLAVIX ) 75 MG tablet Take 1 tablet by mouth once daily 30 tablet 0   docusate sodium  (COLACE) 100 MG capsule Take 1 capsule (100 mg total) by mouth daily. 30 capsule 3   enzalutamide  (XTANDI ) 40 MG tablet Take 4 tablets (160 mg total) by mouth daily. 120 tablet 3   ezetimibe  (ZETIA ) 10 MG tablet Take 1 tablet (10 mg total) by mouth daily. 75 tablet 0   ferrous sulfate  325 (65 FE) MG EC tablet Take 1 tablet (325 mg total) by mouth 2 (two) times daily with a meal. 60 tablet 3   metoprolol  succinate (TOPROL -XL) 50 MG 24 hr tablet Take 1 tablet (50 mg total) by mouth daily. Take with or immediately following a meal. 15 tablet 0   nitroGLYCERIN  (NITROSTAT ) 0.4 MG SL tablet Place 1 tablet (0.4 mg total) under the tongue every 5 (five) minutes as needed for chest pain. 25 tablet 3   rosuvastatin  (CRESTOR ) 40 MG tablet Take 1 tablet (40 mg total) by mouth daily. 90 tablet 3   sacubitril -valsartan  (ENTRESTO ) 97-103 MG Take 1 tablet by mouth 2 (two)  times daily. 180 tablet 2   spironolactone  (ALDACTONE ) 25 MG tablet Take 1 tablet (25 mg total) by mouth daily. 1st attempt. Patient is over due for yearly appt. Pt needs appt for any future refills. 30 tablet 0   tamsulosin  (FLOMAX ) 0.4 MG CAPS capsule Take 1 capsule (0.4 mg total) by mouth daily after supper. 90 capsule 3   traZODone  (DESYREL ) 50 MG tablet Take 0.5-1 tablets (25-50 mg total) by mouth at bedtime as needed for sleep. 30 tablet 6   venlafaxine  XR (EFFEXOR  XR) 37.5 MG 24 hr capsule Take 1 capsule (37.5 mg total) by mouth daily with breakfast. 30 capsule 1   VENTOLIN  HFA 108 (90 Base) MCG/ACT inhaler INHALE 2 PUFFS BY MOUTH EVERY 6 HOURS AS NEEDED FOR WHEEZING OR SHORTNESS OF BREATH 18 g 0   furosemide  (LASIX ) 20 MG tablet Take 20 mg tablet by  mouth daily as needed for increase shortness of breath or swelling 30 tablet 1   No current facility-administered medications for this visit.   Facility-Administered Medications Ordered in Other Visits  Medication Dose Route Frequency Provider Last Rate Last Admin   leuprolide  (6 Month) (ELIGARD ) injection 45 mg  45 mg Subcutaneous Once Timmy Forbes, MD         PHYSICAL EXAMINATION: ECOG PERFORMANCE STATUS: 1 - Symptomatic but completely ambulatory Vitals:   08/13/23 1016  BP: (!) 191/96  Pulse: 70  Resp: 20  Temp: 97.8 F (36.6 C)  SpO2: 100%   Filed Weights   08/13/23 1016  Weight: 165 lb 11.2 oz (75.2 kg)    Physical Exam Constitutional:      General: He is not in acute distress.    Comments: Thin built.   HENT:     Head: Normocephalic and atraumatic.  Eyes:     General: No scleral icterus. Neck:     Comments: Palpable right cervical mass Cardiovascular:     Rate and Rhythm: Normal rate.  Pulmonary:     Effort: Pulmonary effort is normal. No respiratory distress.  Abdominal:     General: Bowel sounds are normal. There is no distension.     Palpations: Abdomen is soft.  Musculoskeletal:        General: No deformity.  Normal range of motion.     Cervical back: Normal range of motion and neck supple.  Skin:    General: Skin is warm and dry.     Findings: No erythema or rash.  Neurological:     Mental Status: He is alert and oriented to person, place, and time. Mental status is at baseline.     Cranial Nerves: No cranial nerve deficit.  Psychiatric:        Mood and Affect: Mood normal.     LABORATORY DATA:  I have reviewed the data as listed    Latest Ref Rng & Units 08/13/2023   10:04 AM 04/21/2023    1:18 PM 12/26/2022   10:11 AM  CBC  WBC 4.0 - 10.5 K/uL 5.5  5.3  5.5   Hemoglobin 13.0 - 17.0 g/dL 56.2  13.0  86.5   Hematocrit 39.0 - 52.0 % 36.5  33.5  38.5   Platelets 150 - 400 K/uL 249  372  322       Latest Ref Rng & Units 08/13/2023   10:04 AM 04/21/2023    1:18 PM 12/26/2022   10:11 AM  CMP  Glucose 70 - 99 mg/dL 784  696  295   BUN 8 - 23 mg/dL 14  12  11    Creatinine 0.61 - 1.24 mg/dL 2.84  1.32  4.40   Sodium 135 - 145 mmol/L 143  140  137   Potassium 3.5 - 5.1 mmol/L 3.9  3.7  4.0   Chloride 98 - 111 mmol/L 107  106  103   CO2 22 - 32 mmol/L 27  25  25    Calcium  8.9 - 10.3 mg/dL 9.2  9.4  9.8   Total Protein 6.5 - 8.1 g/dL 6.9  7.4  8.4   Total Bilirubin 0.0 - 1.2 mg/dL 0.3  0.4  0.5   Alkaline Phos 38 - 126 U/L 53  64  78   AST 15 - 41 U/L 15  16  19    ALT 0 - 44 U/L 10  10  11       RADIOGRAPHIC STUDIES: I have personally  reviewed the radiological images as listed and agreed with the findings in the report. No results found.

## 2023-08-13 NOTE — Assessment & Plan Note (Addendum)
 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 No RT per Dr. Rushie Chestnut On Xtandi 160mg  in end of March 2024. -Today's PSA is pending. Continue Xtandi.

## 2023-08-13 NOTE — Telephone Encounter (Signed)
 Pt is requesting Dr. Addie Holstein to refill his inhaler. Pt stated that he does not have a PCP and would like to see if Dr. Addie Holstein would refill this medication. Please address

## 2023-08-14 ENCOUNTER — Other Ambulatory Visit: Payer: Self-pay

## 2023-08-14 MED ORDER — ENZALUTAMIDE 40 MG PO TABS
160.0000 mg | ORAL_TABLET | Freq: Every day | ORAL | 3 refills | Status: DC
  Filled 2023-08-14: qty 120, 30d supply, fill #0
  Filled 2023-09-04 – 2023-11-03 (×4): qty 120, 30d supply, fill #1
  Filled 2023-11-27: qty 120, 30d supply, fill #2
  Filled 2023-12-28: qty 120, 30d supply, fill #3

## 2023-08-14 NOTE — Progress Notes (Signed)
 Called & Spoke with Patient about new delivery date for Monday 6/9 Due to MD sending refill today 6/6.

## 2023-08-19 NOTE — Addendum Note (Signed)
 Addended by: Bebe Bourdon on: 08/19/2023 09:04 AM   Modules accepted: Orders

## 2023-08-19 NOTE — Telephone Encounter (Signed)
 Ventolin  inhaler decline refill. Patient received original prescription from primary - Daniel Hobby, NP    Not a cardiac medication

## 2023-08-25 ENCOUNTER — Other Ambulatory Visit: Payer: Self-pay | Admitting: Cardiology

## 2023-09-01 ENCOUNTER — Other Ambulatory Visit (HOSPITAL_COMMUNITY): Payer: Self-pay

## 2023-09-04 ENCOUNTER — Other Ambulatory Visit (HOSPITAL_COMMUNITY): Payer: Self-pay

## 2023-09-04 ENCOUNTER — Ambulatory Visit (INDEPENDENT_AMBULATORY_CARE_PROVIDER_SITE_OTHER): Payer: Self-pay

## 2023-09-04 DIAGNOSIS — I255 Ischemic cardiomyopathy: Secondary | ICD-10-CM | POA: Diagnosis not present

## 2023-09-04 LAB — CUP PACEART REMOTE DEVICE CHECK
Battery Remaining Longevity: 40 mo
Battery Remaining Percentage: 52 %
Battery Voltage: 2.95 V
Brady Statistic AP VP Percent: 11 %
Brady Statistic AP VS Percent: 1 %
Brady Statistic AS VP Percent: 85 %
Brady Statistic AS VS Percent: 4.1 %
Brady Statistic RA Percent Paced: 11 %
Date Time Interrogation Session: 20250627020112
HighPow Impedance: 68 Ohm
Implantable Lead Connection Status: 753985
Implantable Lead Connection Status: 753985
Implantable Lead Connection Status: 753985
Implantable Lead Implant Date: 20220330
Implantable Lead Implant Date: 20220330
Implantable Lead Implant Date: 20220330
Implantable Lead Location: 753858
Implantable Lead Location: 753859
Implantable Lead Location: 753860
Implantable Pulse Generator Implant Date: 20220330
Lead Channel Impedance Value: 340 Ohm
Lead Channel Impedance Value: 360 Ohm
Lead Channel Impedance Value: 710 Ohm
Lead Channel Pacing Threshold Amplitude: 0.75 V
Lead Channel Pacing Threshold Amplitude: 0.75 V
Lead Channel Pacing Threshold Amplitude: 1.75 V
Lead Channel Pacing Threshold Pulse Width: 0.5 ms
Lead Channel Pacing Threshold Pulse Width: 0.5 ms
Lead Channel Pacing Threshold Pulse Width: 0.5 ms
Lead Channel Sensing Intrinsic Amplitude: 0.7 mV
Lead Channel Sensing Intrinsic Amplitude: 12 mV
Lead Channel Setting Pacing Amplitude: 2 V
Lead Channel Setting Pacing Amplitude: 2.5 V
Lead Channel Setting Pacing Amplitude: 3 V
Lead Channel Setting Pacing Pulse Width: 0.5 ms
Lead Channel Setting Pacing Pulse Width: 0.5 ms
Lead Channel Setting Sensing Sensitivity: 0.5 mV
Pulse Gen Serial Number: 111039291
Zone Setting Status: 755011

## 2023-09-06 ENCOUNTER — Ambulatory Visit: Payer: Self-pay | Admitting: Cardiology

## 2023-09-07 ENCOUNTER — Other Ambulatory Visit (HOSPITAL_COMMUNITY): Payer: Self-pay

## 2023-09-09 ENCOUNTER — Other Ambulatory Visit (HOSPITAL_COMMUNITY): Payer: Self-pay

## 2023-09-14 ENCOUNTER — Other Ambulatory Visit: Payer: Self-pay

## 2023-09-21 ENCOUNTER — Other Ambulatory Visit: Payer: Self-pay

## 2023-09-30 ENCOUNTER — Encounter: Admitting: Pulmonary Disease

## 2023-09-30 ENCOUNTER — Other Ambulatory Visit: Payer: Self-pay

## 2023-10-06 ENCOUNTER — Other Ambulatory Visit: Payer: Self-pay | Admitting: Cardiology

## 2023-10-06 NOTE — Progress Notes (Unsigned)
  Electrophysiology Office Note:   ID:  Daniel Weiss, DOB 12-Sep-1960, MRN [id number]  Primary Cardiologist: Alm Clay, MD Electrophysiologist: Will Gladis Norton, MD  {Click to update primary MD,subspecialty MD or APP then REFRESH:1}    History of Present Illness:   Daniel Weiss is a 63 y.o. male with h/o ICM, CAD, HTN, CHF s/p ICD, and LBBB seen today for routine electrophysiology followup.   Since last being seen in our clinic the patient reports doing ***.  he denies chest pain, palpitations, dyspnea, PND, orthopnea, nausea, vomiting, dizziness, syncope, edema, weight gain, or early satiety.   Review of systems complete and found to be negative unless listed in HPI.   EP Information / Studies Reviewed:    EKG is ordered today. Personal review as below.       ICD Interrogation-  reviewed in detail today,  See PACEART report.  Arrhythmia/Device History Abbott CRT-D 06/06/2020 for CHF   Physical Exam:   VS:  There were no vitals taken for this visit.   Wt Readings from Last 3 Encounters:  08/13/23 165 lb 11.2 oz (75.2 kg)  04/21/23 168 lb 11.2 oz (76.5 kg)  12/26/22 178 lb 9.6 oz (81 kg)     GEN: No acute distress *** NECK: No JVD; No carotid bruits CARDIAC: {EPRHYTHM:28826}, no murmurs, rubs, gallops RESPIRATORY:  Clear to auscultation without rales, wheezing or rhonchi  ABDOMEN: Soft, non-tender, non-distended EXTREMITIES:  {EDEMA LEVEL:28147::No} edema; No deformity   ASSESSMENT AND PLAN:    Chronic systolic CHF  s/p Abbott CRT-D  euvolemic today Stable on an appropriate medical regimen Normal ICD function See Pace Art report No changes today  CAD No s/s of ischemia.      HTN Stable on current regimen   HLD Continue statin   SVT Minimally symptomatic *** Continue toprol  50 mg daily  Disposition:   Follow up with {EPPROVIDERS:28135::EP Team} {EPFOLLOW UP:28173}   Signed, Ozell Prentice Passey, PA-C

## 2023-10-07 ENCOUNTER — Encounter: Payer: Self-pay | Admitting: Emergency Medicine

## 2023-10-07 ENCOUNTER — Ambulatory Visit: Attending: Emergency Medicine | Admitting: Emergency Medicine

## 2023-10-07 VITALS — BP 148/88 | HR 60 | Ht 72.0 in | Wt 169.6 lb

## 2023-10-07 DIAGNOSIS — I340 Nonrheumatic mitral (valve) insufficiency: Secondary | ICD-10-CM | POA: Diagnosis present

## 2023-10-07 DIAGNOSIS — I25119 Atherosclerotic heart disease of native coronary artery with unspecified angina pectoris: Secondary | ICD-10-CM | POA: Insufficient documentation

## 2023-10-07 DIAGNOSIS — I255 Ischemic cardiomyopathy: Secondary | ICD-10-CM | POA: Diagnosis present

## 2023-10-07 DIAGNOSIS — I10 Essential (primary) hypertension: Secondary | ICD-10-CM | POA: Diagnosis present

## 2023-10-07 DIAGNOSIS — I471 Supraventricular tachycardia, unspecified: Secondary | ICD-10-CM | POA: Diagnosis present

## 2023-10-07 DIAGNOSIS — I351 Nonrheumatic aortic (valve) insufficiency: Secondary | ICD-10-CM | POA: Diagnosis present

## 2023-10-07 DIAGNOSIS — I5020 Unspecified systolic (congestive) heart failure: Secondary | ICD-10-CM | POA: Diagnosis present

## 2023-10-07 LAB — LIPID PANEL

## 2023-10-07 LAB — CBC

## 2023-10-07 MED ORDER — VENTOLIN HFA 108 (90 BASE) MCG/ACT IN AERS: 2.0000 | INHALATION_SPRAY | RESPIRATORY_TRACT | 5 refills | Status: AC | PRN

## 2023-10-07 MED ORDER — EZETIMIBE 10 MG PO TABS: 10.0000 mg | ORAL_TABLET | Freq: Every day | ORAL | 1 refills | Status: AC

## 2023-10-07 MED ORDER — SPIRONOLACTONE 25 MG PO TABS: 25.0000 mg | ORAL_TABLET | Freq: Every day | ORAL | 1 refills | Status: AC

## 2023-10-07 NOTE — Patient Instructions (Addendum)
 Medication Instructions:  NO CHANGES.   Lab Work: CMET, CBC, AND FASTING LIPID PANEL TO BE DONE TODAY.   Testing/Procedures: Your physician has requested that you have an echocardiogram. Echocardiography is a painless test that uses sound waves to create images of your heart. It provides your doctor with information about the size and shape of your heart and how well your heart's chambers and valves are working. This procedure takes approximately one hour. There are no restrictions for this procedure. Please do NOT wear cologne, perfume, aftershave, or lotions (deodorant is allowed). Please arrive 15 minutes prior to your appointment time.  Please note: We ask at that you not bring children with you during ultrasound (echo/ vascular) testing. Due to room size and safety concerns, children are not allowed in the ultrasound rooms during exams. Our front office staff cannot provide observation of children in our lobby area while testing is being conducted. An adult accompanying a patient to their appointment will only be allowed in the ultrasound room at the discretion of the ultrasound technician under special circumstances. We apologize for any inconvenience.   Follow-Up: At [facility], you and your health needs are our priority.  As part of our continuing mission to provide you with exceptional heart care, our providers are all part of one team.  This team includes your primary Cardiologist (physician) and Advanced Practice Providers or APPs (Physician Assistants and Nurse Practitioners) who all work together to provide you with the care you need, when you need it.  Your next appointment:   6 WEEKS FOR BP CHECK  Provider:   Alm Clay, MD OR Lum Louis, DNP   We recommend signing up for the patient portal called MyChart.  Sign up information is provided on this After Visit Summary.  MyChart is used to connect with patients for Virtual Visits (Telemedicine).  Patients  are able to view lab/test results, encounter notes, upcoming appointments, etc.  Non-urgent messages can be sent to your provider as well.   To learn more about what you can do with MyChart, go to [email or web address].   Other Instructions PLEASE BE SURE TO CHECK AND LOG BOTH BLOOD PRESSURE AND HEART RATE EVERY DAY 1 HOUR AFTER TAKING YOUR BLOOD PRESSURE MEDICATIONS.  PLEASE BE SURE TO ESTABLISH CARE WITH A PRIMARY CARE PHYSICIAN. BE SURE TO GO TO YOUR EP APPOINTMENT ON TOMORROW.

## 2023-10-07 NOTE — Progress Notes (Signed)
 Cardiology Office Note:    Date:  10/07/2023  ID:  Daniel Weiss, DOB 06/27/1960, MRN [id number] PCP: Pcp, No  [facility] Providers Cardiologist:  Alm Clay, MD Electrophysiologist:  Will Gladis Norton, MD       Patient Profile:       Chief Complaint: Follow-up for CAD and systolic heart failure History of Present Illness:  Daniel Weiss is a 63 y.o. 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  Brilinta  was changed to Plavix  and was started on Coumadin  at the time.  Imaging was also notable for multiple lytic lesions consistent with metastatic prostate cancer.  He is struggled with poor appetite, weight loss, and orthostatic symptoms related to cancer and treatment.  Due to need for prostate biopsy, Coumadin  was stopped due to resolution of thrombus on repeat imaging.  Biopsy confirmed metastatic seminoma.  He was hospitalized in December 2021 with a GI bleed.  Echocardiogram at that time showed LVEF 35% with mild motion abnormalities consistent with prior LAD infarction.  He is s/p CRT ICD implanted on 05/2020.  Last seen by general cardiology on 01/28/2021 by Jon, PA.  Patient was doing well at the time.  No medication changes were made.  He was to follow-up in 6 months.  He was last seen in office by EP on 07/29/2021.  Patient was doing well at the time without cardiovascular concerns or complaint.  No medication changes were made and patient was to follow-up in 1 year.   Discussed the use of AI scribe software for clinical note transcription with the patient, who gave verbal consent to proceed.  History of Present Illness Daniel Weiss is a 63 year old male with a history of myocardial infarction and  pacemaker placement who presents for a cardiology follow-up.  Of note he has not been seen by general cardiology in 2 and half years.  Today patient tells me he is feeling well.  He said no acute changes in his health over the past 2 years.  He experienced an episode of chest pain two to three weeks ago, that occurred at rest that was relieved by nitroglycerin , without physical exertion.  Other than that 1 episode he denies any other episodes of chest pain.  He denies any episodes of palpitations syncope or presyncope.  Shortness of breath occurs frequently, especially with minimal exertion. He uses an albuterol  inhaler and requires a refill.  His Lasix  as prescribed as as needed but he has been taking it daily.  His medication regimen includes Entresto , spironolactone , metoprolol , Plavix , and rosuvastatin . He is unsure about spironolactone  use and lacks ezetimibe . Blood pressure is normal at home but elevated during visits.  He did not take his medication prior to visit today.  He smoked for five to ten years but has quit. No leg swelling, orthopnea, PND.   Review of systems:  Please see the history of present illness. All other systems are reviewed and otherwise negative.      Studies Reviewed:    EKG Interpretation Date/Time:  Wednesday October 07 2023 08:05:37 EDT Ventricular Rate:  60 PR Interval:  92 QRS Duration:  160 QT Interval:  482 QTC Calculation: 482 R Axis:   104  Text Interpretation: AV dual-paced rhythm When compared with ECG  of 20-Nov-2021 11:07, PREVIOUS ECG IS PRESENT Confirmed by Rana Dixon [phone]) on 10/07/2023 8:33:06 AM    Echocardiogram 01/15/2022 1. Akinesis of the distal anteroseptal wall and apex with overall  moderate LV dysfunction.   2. Left ventricular ejection fraction, by estimation, is 35 to 40%. The  left ventricle has moderately decreased function. The left ventricle  demonstrates regional wall motion abnormalities (see scoring  diagram/findings for  description). Left ventricular   diastolic parameters are consistent with Grade I diastolic dysfunction  (impaired relaxation).   3. Right ventricular systolic function is normal. The right ventricular  size is normal. Tricuspid regurgitation signal is inadequate for assessing  PA pressure.   4. The mitral valve is normal in structure. Mild mitral valve  regurgitation. No evidence of mitral stenosis.   5. The aortic valve is tricuspid. Aortic valve regurgitation is mild to  moderate. No aortic stenosis is present.   6. The inferior vena cava is normal in size with greater than 50%  respiratory variability, suggesting right atrial pressure of 3 mmHg.   Echocardiogram 02/23/2020  1. Akinesis of the distal anteroseptal and apical walls with overall  moderate to severe LV dysfunction.   2. Left ventricular ejection fraction, by estimation, is 30 to 35%. The  left ventricle has moderate to severely decreased function. The left  ventricle demonstrates regional wall motion abnormalities (see scoring  diagram/findings for description). Left  ventricular diastolic parameters are consistent with Grade II diastolic  dysfunction (pseudonormalization).   3. Right ventricular systolic function is normal. The right ventricular  size is normal.   4. The mitral valve is normal in structure. No evidence of mitral valve  regurgitation. No evidence of mitral stenosis.   5. The aortic valve has an indeterminant number of cusps. Aortic valve  regurgitation is trivial. No aortic stenosis is present.   6. The inferior vena cava is normal in size with greater than 50%  respiratory variability, suggesting right atrial pressure of 3 mmHg.   Risk Assessment/Calculations:     HYPERTENSION CONTROL Vitals:   10/07/23 0802 10/07/23 0841  BP: (!) 160/98 (!) 148/88    The patient's blood pressure is elevated above target today.  In order to address the patient's elevated BP: Blood pressure will be monitored  at home to determine if medication changes need to be made.           Physical Exam:   VS:  BP (!) 148/88 (BP Location: Left Arm, Patient Position: Sitting, Cuff Size: Normal)   Pulse 60   Ht 6' (1.829 m)   Wt 169 lb 9.6 oz (76.9 kg)   SpO2 100%   BMI 23.00 kg/m    Wt Readings from Last 3 Encounters:  10/07/23 169 lb 9.6 oz (76.9 kg)  08/13/23 165 lb 11.2 oz (75.2 kg)  04/21/23 168 lb 11.2 oz (76.5 kg)    GEN: Well nourished, well developed in no acute distress NECK: No JVD; No carotid bruits CARDIAC: RRR.  3/6 murmur heard best at RUSB.  No rubs, gallops RESPIRATORY:  Clear to auscultation without rales, wheezing or rhonchi  ABDOMEN: Soft, non-tender, non-distended EXTREMITIES:  No edema; No acute deformity      Assessment and Plan:  Coronary artery disease S/p anterior STEMI with DES to LAD and PTCA of diagonal branch in 2019, was delayed presentation with significant infarct Subsequent CCTA on 10/2018 was unremarkable Lexiscan  Myoview  11/2019 consistent with prior MI and no ischemia - Today patient is without anginal symptoms.  Denies any exertional chest pains.  Continues to experience dyspnea on exertion since MI.  Notes 1 episode of nonexertional chest pain over the past 2 years that occurred at rest, relieved by nitroglycerin .  There is no indication for further ischemic evaluation at this time - Continue clopidogrel  75 mg daily, ezetimibe  10 mg daily, rosuvastatin  40 mg daily, metoprolol  XL 50 mg daily - BMET and CBC  Hyperlipidemia, LDL goal <55 LDL 101 on 02/2020 and uncontrolled No recent updated cholesterol panel.  Not following with PCP Reports has not been taking ezetimibe  - Plan for fasting lipid panel/LFTs today - Refill ezetimibe  10 mg daily and continue rosuvastatin  40 mg daily  Hypertension Blood pressure today is 160/98 and repeat 148/88 and not well-controlled Notes he did not take his blood pressure medication this morning and reports blood pressure has  been 130s at home Tells me he is unsure if he is taking spironolactone  which I will refill today - Plan to monitor BP and log at home over the next 6 weeks and review at follow-up visit - Continue metoprolol  XL 50 mg daily, Entresto  97-103 mg twice daily, spironolactone  25 mg daily  Chronic systolic heart failure Ischemic cardiomyopathy S/p CRT-D on 05/2020 Echocardiogram 02/2020 with LVEF 30 to 35% Echocardiogram 07/2021 with LVEF 35 to 40% NYHA class II due to dyspnea - Today he remains euvolemic and well compensated on exam.  Volume status is stable.  No orthopnea, PND, leg swelling.   - Continues to experience DOE since MI in 2019 that does limit his daily activities.  Reports this is not progressive and has been stable - Reports he has been taking his Lasix  daily instead of as needed over the past 2 years - Plan to repeat echocardiogram today to reevaluate LV function - Continue current GDMT of metoprolol  XL 50 mg daily, Entresto  97-103 mg twice daily, spironolactone  25 mg daily, Lasix  20 mg as needed - BMET today  Mild to moderate AR Mild MR Echocardiogram 07/2021 with mild to moderate aortic valve regurgitation and mild mitral valve regurgitation - Murmur heard today on exam at right upper sternal border - Does continue to experience DOE since MI without chest pains, syncope, lightheadedness or dizziness - Plan to repeat echocardiogram today to reevaluate valvular function   SVT NSVT Episodes noted on device interrogation Recent Paceart reported 2 AMS events both on 08/02/2023 with longest 18 min.  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  Signed, Lum LITTIE Louis, NP

## 2023-10-08 ENCOUNTER — Ambulatory Visit: Payer: Self-pay | Admitting: Emergency Medicine

## 2023-10-08 ENCOUNTER — Encounter: Payer: Self-pay | Admitting: Student

## 2023-10-08 ENCOUNTER — Ambulatory Visit: Attending: Student | Admitting: Student

## 2023-10-08 VITALS — BP 146/80 | HR 68 | Ht 72.0 in | Wt 170.0 lb

## 2023-10-08 DIAGNOSIS — I5042 Chronic combined systolic (congestive) and diastolic (congestive) heart failure: Secondary | ICD-10-CM | POA: Diagnosis present

## 2023-10-08 DIAGNOSIS — I10 Essential (primary) hypertension: Secondary | ICD-10-CM | POA: Insufficient documentation

## 2023-10-08 DIAGNOSIS — I255 Ischemic cardiomyopathy: Secondary | ICD-10-CM | POA: Diagnosis present

## 2023-10-08 DIAGNOSIS — I25119 Atherosclerotic heart disease of native coronary artery with unspecified angina pectoris: Secondary | ICD-10-CM | POA: Diagnosis present

## 2023-10-08 DIAGNOSIS — I447 Left bundle-branch block, unspecified: Secondary | ICD-10-CM | POA: Diagnosis present

## 2023-10-08 LAB — CBC
Hematocrit: 36.7 — AB (ref 37.5–51.0)
Hemoglobin: 11.9 g/dL — AB (ref 13.0–17.7)
MCH: 27.4 pg (ref 26.6–33.0)
MCHC: 32.4 g/dL (ref 31.5–35.7)
MCV: 84 fL (ref 79–97)
Platelets: 360 x10E3/uL (ref 150–450)
RBC: 4.35 x10E6/uL (ref 4.14–5.80)
RDW: 16.3 — AB (ref 11.6–15.4)
WBC: 7 x10E3/uL (ref 3.4–10.8)

## 2023-10-08 LAB — CUP PACEART INCLINIC DEVICE CHECK
Battery Remaining Longevity: 44 mo
Brady Statistic RA Percent Paced: 11 %
Brady Statistic RV Percent Paced: 95 %
Date Time Interrogation Session: 20250731115018
HighPow Impedance: 61.875
Implantable Lead Connection Status: 753985
Implantable Lead Connection Status: 753985
Implantable Lead Connection Status: 753985
Implantable Lead Implant Date: 20220330
Implantable Lead Implant Date: 20220330
Implantable Lead Implant Date: 20220330
Implantable Lead Location: 753858
Implantable Lead Location: 753859
Implantable Lead Location: 753860
Implantable Pulse Generator Implant Date: 20220330
Lead Channel Impedance Value: 350 Ohm
Lead Channel Impedance Value: 362.5 Ohm
Lead Channel Impedance Value: 812.5 Ohm
Lead Channel Pacing Threshold Amplitude: 0.75 V
Lead Channel Pacing Threshold Amplitude: 0.75 V
Lead Channel Pacing Threshold Amplitude: 0.75 V
Lead Channel Pacing Threshold Amplitude: 0.75 V
Lead Channel Pacing Threshold Amplitude: 1.25 V
Lead Channel Pacing Threshold Amplitude: 1.25 V
Lead Channel Pacing Threshold Pulse Width: 0.5 ms
Lead Channel Pacing Threshold Pulse Width: 0.5 ms
Lead Channel Pacing Threshold Pulse Width: 0.5 ms
Lead Channel Pacing Threshold Pulse Width: 0.5 ms
Lead Channel Pacing Threshold Pulse Width: 0.5 ms
Lead Channel Pacing Threshold Pulse Width: 0.5 ms
Lead Channel Sensing Intrinsic Amplitude: 1.4 mV
Lead Channel Sensing Intrinsic Amplitude: 12 mV
Lead Channel Setting Pacing Amplitude: 2 V
Lead Channel Setting Pacing Amplitude: 2.25 V
Lead Channel Setting Pacing Amplitude: 2.5 V
Lead Channel Setting Pacing Pulse Width: 0.5 ms
Lead Channel Setting Pacing Pulse Width: 0.5 ms
Lead Channel Setting Sensing Sensitivity: 0.5 mV
Pulse Gen Serial Number: 111039291
Zone Setting Status: 755011

## 2023-10-08 LAB — COMPREHENSIVE METABOLIC PANEL WITH GFR
ALT: 7 IU/L (ref 0–44)
AST: 15 IU/L (ref 0–40)
Albumin: 4.5 g/dL (ref 3.9–4.9)
Alkaline Phosphatase: 80 IU/L (ref 44–121)
BUN/Creatinine Ratio: 12 (ref 10–24)
BUN: 9 mg/dL (ref 8–27)
Bilirubin Total: 0.3 mg/dL (ref 0.0–1.2)
CO2: 20 mmol/L (ref 20–29)
Calcium: 10 mg/dL (ref 8.6–10.2)
Chloride: 102 mmol/L (ref 96–106)
Creatinine, Ser: 0.78 mg/dL (ref 0.76–1.27)
Globulin, Total: 3.2 g/dL (ref 1.5–4.5)
Glucose: 94 mg/dL (ref 70–99)
Potassium: 4.5 mmol/L (ref 3.5–5.2)
Sodium: 140 mmol/L (ref 134–144)
Total Protein: 7.7 g/dL (ref 6.0–8.5)
eGFR: 100 mL/min/1.73 (ref >59)

## 2023-10-08 LAB — LIPID PANEL
Cholesterol, Total: 192 mg/dL (ref 100–199)
HDL: 61 mg/dL (ref >39)
LDL CALC COMMENT:: 3.1 ratio (ref 0.0–5.0)
LDL Chol Calc (NIH): 108 mg/dL — AB (ref 0–99)
Triglycerides: 132 mg/dL (ref 0–149)
VLDL Cholesterol Cal: 23 mg/dL (ref 5–40)

## 2023-10-08 NOTE — Patient Instructions (Signed)
 Medication Instructions:  Your physician recommends that you continue on your current medications as directed. Please refer to the Current Medication list given to you today.  *If you need a refill on your cardiac medications before your next appointment, please call your pharmacy*  Lab Work: None ordered If you have labs (blood work) drawn today and your tests are completely normal, you will receive your results only by: MyChart Message (if you have MyChart) OR A paper copy in the mail If you have any lab test that is abnormal or we need to change your treatment, we will call you to review the results.  Follow-Up: At [facility], you and your health needs are our priority.  As part of our continuing mission to provide you with exceptional heart care, our providers are all part of one team.  This team includes your primary Cardiologist (physician) and Advanced Practice Providers or APPs (Physician Assistants and Nurse Practitioners) who all work together to provide you with the care you need, when you need it.  Your next appointment:   1 year(s)  Provider:   You may see Will Gladis Norton, MD or one of the following Advanced Practice Providers on your designated Care Team:   Charlies Arthur, PA-C Michael Andy Tillery, PA-C Suzann Riddle, NP Daphne Barrack, NP   We recommend signing up for the patient portal called MyChart.  Sign up information is provided on this After Visit Summary.  MyChart is used to connect with patients for Virtual Visits (Telemedicine).  Patients are able to view lab/test results, encounter notes, upcoming appointments, etc.  Non-urgent messages can be sent to your provider as well.   To learn more about what you can do with MyChart, go to [email or web address].

## 2023-10-09 ENCOUNTER — Ambulatory Visit: Payer: Self-pay | Admitting: Cardiology

## 2023-10-15 ENCOUNTER — Other Ambulatory Visit (HOSPITAL_BASED_OUTPATIENT_CLINIC_OR_DEPARTMENT_OTHER): Payer: Self-pay

## 2023-11-03 ENCOUNTER — Other Ambulatory Visit (HOSPITAL_COMMUNITY): Payer: Self-pay

## 2023-11-03 ENCOUNTER — Other Ambulatory Visit: Payer: Self-pay | Admitting: Pharmacy Technician

## 2023-11-03 ENCOUNTER — Other Ambulatory Visit: Payer: Self-pay

## 2023-11-03 NOTE — Progress Notes (Signed)
 Specialty Pharmacy Refill Coordination Note  Daniel Weiss is a 63 y.o. male contacted today regarding refills of specialty medication(s) Enzalutamide  (XTANDI )   Patient requested Delivery   Delivery date: 11/05/23   Verified address: [address] [place] [address]   Medication will be filled on 11/04/2023.   Deago Burruss (Patty) Chet Burnet, CPhT  [facility], Zelda Salmon, [facility] Patient Advocate Specialist III Phone: [phone]  Fax: [phone]

## 2023-11-06 NOTE — Progress Notes (Signed)
 Remote ICD transmission.

## 2023-11-12 ENCOUNTER — Inpatient Hospital Stay (HOSPITAL_BASED_OUTPATIENT_CLINIC_OR_DEPARTMENT_OTHER): Admitting: Oncology

## 2023-11-12 ENCOUNTER — Encounter: Payer: Self-pay | Admitting: Oncology

## 2023-11-12 ENCOUNTER — Inpatient Hospital Stay: Attending: Oncology

## 2023-11-12 VITALS — BP 151/90 | HR 77 | Temp 96.7°F | Resp 18 | Wt 164.2 lb

## 2023-11-12 DIAGNOSIS — C801 Malignant (primary) neoplasm, unspecified: Secondary | ICD-10-CM

## 2023-11-12 DIAGNOSIS — F17210 Nicotine dependence, cigarettes, uncomplicated: Secondary | ICD-10-CM | POA: Insufficient documentation

## 2023-11-12 DIAGNOSIS — C6290 Malignant neoplasm of unspecified testis, unspecified whether descended or undescended: Secondary | ICD-10-CM | POA: Insufficient documentation

## 2023-11-12 DIAGNOSIS — C7951 Secondary malignant neoplasm of bone: Secondary | ICD-10-CM

## 2023-11-12 DIAGNOSIS — Z79899 Other long term (current) drug therapy: Secondary | ICD-10-CM | POA: Diagnosis not present

## 2023-11-12 DIAGNOSIS — C61 Malignant neoplasm of prostate: Secondary | ICD-10-CM

## 2023-11-12 DIAGNOSIS — Z923 Personal history of irradiation: Secondary | ICD-10-CM | POA: Diagnosis not present

## 2023-11-12 DIAGNOSIS — L729 Follicular cyst of the skin and subcutaneous tissue, unspecified: Secondary | ICD-10-CM | POA: Insufficient documentation

## 2023-11-12 DIAGNOSIS — R197 Diarrhea, unspecified: Secondary | ICD-10-CM | POA: Diagnosis not present

## 2023-11-12 DIAGNOSIS — Z79818 Long term (current) use of other agents affecting estrogen receptors and estrogen levels: Secondary | ICD-10-CM

## 2023-11-12 DIAGNOSIS — D508 Other iron deficiency anemias: Secondary | ICD-10-CM

## 2023-11-12 LAB — CBC WITH DIFFERENTIAL (CANCER CENTER ONLY)
Abs Immature Granulocytes: 0.01 K/uL (ref 0.00–0.07)
Basophils Absolute: 0 K/uL (ref 0.0–0.1)
Basophils Relative: 1 %
Eosinophils Absolute: 0 K/uL (ref 0.0–0.5)
Eosinophils Relative: 1 %
HCT: 37.4 % — ABNORMAL LOW (ref 39.0–52.0)
Hemoglobin: 12.3 g/dL — ABNORMAL LOW (ref 13.0–17.0)
Immature Granulocytes: 0 %
Lymphocytes Relative: 33 %
Lymphs Abs: 1.5 K/uL (ref 0.7–4.0)
MCH: 27.1 pg (ref 26.0–34.0)
MCHC: 32.9 g/dL (ref 30.0–36.0)
MCV: 82.4 fL (ref 80.0–100.0)
Monocytes Absolute: 0.5 K/uL (ref 0.1–1.0)
Monocytes Relative: 11 %
Neutro Abs: 2.4 K/uL (ref 1.7–7.7)
Neutrophils Relative %: 54 %
Platelet Count: 292 K/uL (ref 150–400)
RBC: 4.54 MIL/uL (ref 4.22–5.81)
RDW: 17.4 % — ABNORMAL HIGH (ref 11.5–15.5)
WBC Count: 4.5 K/uL (ref 4.0–10.5)
nRBC: 0 % (ref 0.0–0.2)

## 2023-11-12 LAB — LACTATE DEHYDROGENASE: LDH: 112 U/L (ref 98–192)

## 2023-11-12 LAB — CMP (CANCER CENTER ONLY)
ALT: 10 U/L (ref 0–44)
AST: 21 U/L (ref 15–41)
Albumin: 4.4 g/dL (ref 3.5–5.0)
Alkaline Phosphatase: 56 U/L (ref 38–126)
Anion gap: 12 (ref 5–15)
BUN: 14 mg/dL (ref 8–23)
CO2: 22 mmol/L (ref 22–32)
Calcium: 9.6 mg/dL (ref 8.9–10.3)
Chloride: 100 mmol/L (ref 98–111)
Creatinine: 0.73 mg/dL (ref 0.61–1.24)
GFR, Estimated: >60 mL/min (ref >60)
Glucose, Bld: 92 mg/dL (ref 70–99)
Potassium: 3.5 mmol/L (ref 3.5–5.1)
Sodium: 134 mmol/L — ABNORMAL LOW (ref 135–145)
Total Bilirubin: 0.5 mg/dL (ref 0.0–1.2)
Total Protein: 8 g/dL (ref 6.5–8.1)

## 2023-11-12 LAB — PSA: Prostatic Specific Antigen: 0.02 ng/mL (ref 0.00–4.00)

## 2023-11-12 NOTE — Assessment & Plan Note (Signed)
 Cystic lesion with drainage, refer to dermatology

## 2023-11-12 NOTE — Progress Notes (Signed)
 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 No RT per Dr. Lenn On Xtandi  160mg  in end of March 2024. -Today's PSA is pending. Continue Xtandi .  Encounter for monitoring androgen deprivation therapy Eligard  45mg  Q6 months.  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  Monitor tumor markers    IDA (iron  deficiency anemia) Lab Results  Component Value Date   HGB 12.3 (L) 11/12/2023   TIBC 378 08/13/2023   IRONPCTSAT 24 08/13/2023   FERRITIN 41 08/13/2023   S/p IV venofer  treatments. stable Hold off Venofer .  He declines GI work up due to multiple medical problems.  He is also on Plavix   Metastatic cancer to bone [facility]) Previous PMSA showed no bone metastatic disease.  He is not interested in dental clearance for bisphosphonate. Continue monitor.    Skin cyst Cystic lesion with drainage, refer to dermatology  Diarrhea Check stool studies  Orders Placed This Encounter  Procedures   Gastrointestinal Panel by PCR , Stool    Standing Status:   Future    Expected Date:   11/13/2023    Expiration Date:   02/10/2024   C difficile quick screen w PCR reflex    Standing Status:   Future    Expected Date:   11/13/2023    Expiration Date:   02/10/2024   CBC with Differential (Cancer Center Only)    Standing Status:   Future    Expected Date:   02/11/2024    Expiration Date:   05/11/2024   CMP (Cancer Center only)     Standing Status:   Future    Expected Date:   02/11/2024    Expiration Date:   05/11/2024   PSA    Standing Status:   Future    Expected Date:   02/11/2024    Expiration Date:   05/11/2024   Ambulatory referral to Dermatology    Referral Priority:   Routine    Referral Type:   Consultation    Referral Reason:   Specialty Services Required    Requested Specialty:   Dermatology    Number of Visits Requested:   1   Follow up in 3 months.  All questions were answered. The patient knows to call the clinic with any problems, questions or concerns.  Call Babara, MD, PhD [facility] Hematology Oncology 11/12/2023   HISTORY OF PRESENTING ILLNESS:  Oncology History  Prostate cancer [facility])  12/25/2012 Tumor Marker   PSA 0.11   03/10/2018 Initial Diagnosis   Prostate cancer [facility])  Patient was diagnosed with presumed prostate cancer on 10/2018 with a PSA of 509, incidental findings of multiple sclerotic lesions on CT-chest abdomen pelvis during work-up for a CVA.  His previous urology care was with Dr. Sherrilee. 11/05/2018 bone scan showed solitary punctate focus of activity in the left first sacral segment corresponding to a 9 mm mixed lytic and sclerotic metastatic's on recent CT.  The remaining numerous sclerotic osseous metastasis identified on CT do not  demonstrate abnormal activity and are therefore healed.  At that time No tissue diagnosis/biopsy was obtained. Patient was started with Lupron  and Casodex Lupron  was eventually to Eligard  [facility], PSA was 13.13, testosterone  level less than 10. Casodex was discontinued in June 2021 10/26/2019 patient switched to [facility] urology and was seen by Dr. Twylla Per urology note, PSA trend: 11/03/2018: 502 11/22/2018: 377 02/21/2019: 387 04/25/2019: 123 09/05/2019: 13.3 Patient was continued on Eligard  45mg  Q6 months.    10/06/2019 Imaging   bone scan showed resolution of punctated activity over the left  sacrum. The punctate area of increased activity over the right lower lumbar spine again noted.    10/31/2019 Imaging   CT angio chest aorta showed no evidence of aortic aneurysm or dissection.  Atherosclerotic calcification spleen seen in the upper abdominal aorta.  Chronic artery disease.  No acute cardiopulmonary disease. Patient has a chronic right neck mass.  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    11/18/2019 Imaging   11/18/2019 CT abdomen pelvis with contrast showed retrocaval lymphadenopathy in the right common and external iliac chains.  Stable to minimally progressed in the interval since last CT scan 11/03/2018 Numerous sclerotic bone lesions.  Stable 3 mm subpleural right middle lobe lung nodule stable.  Aortic atherosclerosis   11/28/2019 Procedure   Paracaval lymph node biopsy showed metastatic neoplasm,consistent with metastatic germ cell tumor, morphologically compatible with seminoma, and his case was reviewed on tumor board.  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 Appearance was consistent with primary testicular neoplasm. Normal size and appearance of the left testicle    01/17/2020 Surgery   S/p right orchiectomy  residual germ cell tumor and germ cell neoplasia in situ are not identified.  Case was discussed on tumor board.  Consensus reached a point although residual germ cell tumor/infection was not identified, the presence of scar with extensive tubular atrophy is compatible with a completely regressed germ  cell tumor.   He went to [facility] for [facility] opinion as I recommended. Due to long waiting time, he left without being seen. Dr.Rush from [facility] called and agrees with patient current treatment.    03/14/2020 Imaging   PET  . - small hypermetabolic lymph nodes along the RIGHT iliac vessels is most consistent with nodal metastasis.    04/12/2020 - 05/08/2020 Radiation Therapy   radiation to periaortic lymph nodes as well as right hemipelvis node.    12/19/2020 Imaging   CT chest abdomen pelvis showed interval resolution of retrocaval and right iliac lymphadenotomy. No new or progressive disease. Stable sclerotic lesions. Non obstructing bilateral nephrolithiasis.    06/24/2021 Tumor Marker   PSA 1.80    11/08/2021 Tumor Marker   PSA 2.9    11/14/2021 Imaging   CT chest abdomen pelvis w contrast  No new or progressive findings on today's examination.   Unchanged multiple small sclerotic lesions in the thoracic and lumbar spine as well as the pelvis. Stable 4 mm right  middle lobe nodule. Recommend continued attention on follow-up. Aortic Atherosclerosis   12/17/2021 Tumor Marker   PSA 3.27   05/06/2022 Tumor Marker   PSA 5.75   05/29/2022 Imaging   PMSA PET showed 1. Intense radiotracer activity within the prostate gland consistent with primary prostate adenocarcinoma. 2. Resolution of RIGHT iliac adenopathy. 3. No evidence of metastatic adenopathy in the pelvis or periaortic retroperitoneum. 4. No evidence of visceral metastasis or skeletal metastasis. 5. Bilobed hypodense mass in the RIGHT neck is stable over multiple comparison exams including FDG PET scan and CT neck 11/03/2018   05/2022 -  Chemotherapy   Started on Xtandi  160mg  daily.    Metastatic cancer to bone [facility])     Patient has extensive cardiology issues.  He follows up with Dr. Anner. Patient has history of STEMI-PCI to LAD with resultant ischemia cardiomyopathy, EF 35 to 40%, left bundle branch block with acute  CVA-10/2018.  CT coronary showed nonobstructing plaquing of coronary distributions, intimal irregularity with filling defect in the ascending aorta but no LV thrombus.  And the patient is on warfarin and Plavix .  Patient was seen by thoracic surgeon Dr. Dusty will recommend patient to be on Coumadin  for minimum of 3 months.  With presentation with stroke, Dr. Anner recommend at least 6 to 12 months of Coumadin . -Coumadin  was discontinued by Dr. Anner in September 2021 and switched to.  Plavix  Patient lives with his sister.  He has 3 adult children.  His activity is quite limited due to shortness of breath with exertion due to cardiology problems.  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  Patient has an ENT evaluation.  Status post biopsy- WARTHIN TUMOR.  Negative for atypia and malignancy. # He has had CRT-D implant.  # 12/19/2020 CT chest abdomen pelvis showed interval resolution of retrocaval and right iliac lymphadenotomy. No new or progressive disease. Stable sclerotic lesions. Non obstructing bilateral nephrolithiasis.  #06/14/2021, CT neck soft tissue with contrast showed multiple bilateral parotid masses, largest lesion on the right has increased the size, now 5.1 x 2.9 x 4.9 cm.  Previously 4.1 cm in 2016.  INTERVAL HISTORY Daniel Weiss is a 63 y.o. male who has above history reviewed by me today presents for follow up visit for management of metastatic prostate cancer and germ cell tumor.  He has no new complaints today.  Patient has been on Xtandi  since March 2024.  He tolerates well. He has been experiencing watery and loose diarrhea for the past two weeks, with a recent occurrence of a formed stool this morning, which is unusual for him. There have been no recent changes in diet, antibiotic use, or laxative use. He denies any associated stomach pain or bloating but mentions a recent inability to control his bowels, which he associates with the diarrhea.  He  reports a skin lesion on the right side of his face, near his lip. This lump refill after being squeezed and are filled with a white substance. The lump on his buttock has been present for about a year.  Review of Systems  Constitutional:  Positive for fatigue. Negative for appetite change, chills, fever and unexpected weight change.  HENT:   Negative for hearing loss and voice change.        Chronic neck mass  Eyes:  Negative for eye problems and icterus.  Respiratory:  Negative for chest tightness, cough and shortness of breath.   Cardiovascular:  Negative for chest pain and leg swelling.  Gastrointestinal:  Negative for abdominal distention and abdominal pain.  Endocrine: Positive for hot flashes.  Genitourinary:  Negative for difficulty urinating, dysuria and frequency.   Musculoskeletal:  Negative for arthralgias.  Skin:  Negative for itching and rash.  Neurological:  Negative for dizziness, light-headedness and numbness.  Hematological:  Negative for adenopathy. Does not bruise/bleed easily.  Psychiatric/Behavioral:  Negative for confusion.     MEDICAL HISTORY:  Past Medical History:  Diagnosis Date   Abnormal chest CT 02/2018   a. Ectatic 3 cm Ao arch - rec f/u outpt imaging w/ CTA or MRA. Ectatic atheromatous abd Ao @ risk for aneurysm - rec f/u u/s in 5 yrs. Marked prostatic enlargement w/ scattered small sclerotic foci in the thoracic lower lumbar spine, sacrum, left 12th rib, pelvis, and right femur.  Recommend elective outpatient whole-body bone scintigraphy and PSA.   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 02/2018 ACS/PCI: LM nl, LAD 85p (3.5x15 Sierra DES), D1 45ost, RI 55ost, RCA nl, EF 25-35%.   Cardiac murmur     Chest pain on exertion 11/28/2019   Chronic combined systolic and diastolic CHF, NYHA class 2 (HCC) 04/17/2020   Complete left bundle branch block (LBBB) 2016   Coronary artery disease involving native coronary artery of native heart with angina pectoris (HCC) 03/10/2018   Dyspnea    Encephalopathy, hypertensive    Essential hypertension 07/07/2014   Germ cell tumor (HCC) 12/22/2019   Goals of care, counseling/discussion 12/22/2019   Hematemesis without nausea    HFrEF (heart failure with reduced ejection fraction) (HCC)    History of CVA (cerebrovascular accident) 11/23/2018   History of non-ST elevation myocardial infarction (NSTEMI) 02/21/2019   Hypertension    Hypokalemia    Hyponatremia 06/29/2015   Ischemic cardiomyopathy 02/2018   a. 02/2018 Echo: EF 35-40% (LV gram on cath was 25-30%), mod, diff HK. mid-apicalanteroseptal, ant, and apical sev HK. RVH.-->  No notable change on echo from June 2020 and August 2020.   Localized swelling, mass or lump of neck    Long term (current) use of anticoagulants 11/16/2018   Malnutrition of moderate degree 11/11/2018   Mass of right side of neck 07/07/2014   Myocardial infarction [facility])    Neck mass    Right Neck mass - US  neck showed 6.3 cm complex, partially cystic, partially solid mass.  CT neck showed 2 cm mass from parotid gland on right, with 3x4x5cm  Possible metastatic lymphadenopathy.  we recommended following up with Dr. Carlie ENT for biopsy of this.      Non-ST elevation (NSTEMI) myocardial infarction (HCC) 03/06/2018   NSTEMI (non-ST elevated myocardial infarction) (HCC) 02/2018   85% pLAD - DES PCI    Paresthesia 07/07/2014   Prostate cancer (HCC) 03/10/2018   Prostate cancer metastatic to multiple sites [facility]) 11/2018   Right sided weakness 07/07/2014   Stroke [facility])    TIA (transient ischemic attack) 2016   Warthin's tumor     SURGICAL HISTORY: Past Surgical History:  Procedure Laterality Date   BIV ICD INSERTION CRT-D N/A 06/06/2020    Procedure: BIV ICD INSERTION CRT-D;  Surgeon: Inocencio Soyla Lunger, MD;  Location: [facility] INVASIVE CV LAB;  Service: Cardiovascular;  Laterality:  N/A;   COLONOSCOPY WITH PROPOFOL  N/A 02/27/2020   Procedure: COLONOSCOPY WITH PROPOFOL ;  Surgeon: Leigh Elspeth SQUIBB, MD;  Location: [facility] ENDOSCOPY;  Service: Gastroenterology;  Laterality: N/A;   CORONARY STENT INTERVENTION N/A 03/06/2018   Procedure: CORONARY STENT INTERVENTION;  Surgeon: Anner Alm ORN, MD;  Location: [facility] INVASIVE CV LAB;  Service: Cardiovascular: Proximal LAD 85% stenosis (and D1): DES PCI with Xience Sierra DES 3.5 mm x 15 mm - 3.9 mm   ESOPHAGOGASTRODUODENOSCOPY (EGD) WITH PROPOFOL  N/A 02/27/2020   Procedure: ESOPHAGOGASTRODUODENOSCOPY (EGD) WITH PROPOFOL ;  Surgeon: Leigh Elspeth SQUIBB, MD;  Location: [facility] ENDOSCOPY;  Service: Gastroenterology;  Laterality: N/A;   HEMOSTASIS CLIP PLACEMENT  02/27/2020   Procedure: HEMOSTASIS CLIP PLACEMENT;  Surgeon: Leigh Elspeth SQUIBB, MD;  Location: [facility] ENDOSCOPY;  Service: Gastroenterology;;   HOT HEMOSTASIS N/A 02/27/2020   Procedure: HOT HEMOSTASIS (ARGON PLASMA COAGULATION/BICAP);  Surgeon: Leigh Elspeth SQUIBB, MD;  Location: [facility] ENDOSCOPY;  Service: Gastroenterology;  Laterality: N/A;   INTRAOPERATIVE TRANSTHORACIC ECHOCARDIOGRAM  03/06/2018   (Peri-MI) mild reduced EF 35-40%.  Diffuse hypokinesis (severe HK of the mid-apical anteroseptal, anterior and apical myocardium consistent with LAD infarct).  Unable to assess diastolic function.  Paradoxical septal motion likely related to his LBBB.  RV hypertrophy noted.  Trivial pericardial effusion noted.    LEFT HEART CATH AND CORONARY ANGIOGRAPHY N/A 03/06/2018   Procedure: LEFT HEART CATH AND CORONARY ANGIOGRAPHY;  Surgeon: Anner Alm ORN, MD;  Location: [facility] INVASIVE CV LAB;  Service: Cardiovascular: Proximal LAD 85% involving D1 45%.  Ostial RI 55%.  Severe LV dysfunction EF 25 to 30%.  1+ MR.  Only minimally elevated LVEDP.   ORCHIECTOMY Right  01/17/2020   Procedure: ORCHIECTOMY;  Surgeon: Twylla Glendia BROCKS, MD;  Location: [facility] ORS;  Service: Urology;  Laterality: Right;   POLYPECTOMY  02/27/2020   Procedure: POLYPECTOMY;  Surgeon: Leigh Elspeth SQUIBB, MD;  Location: [facility] ENDOSCOPY;  Service: Gastroenterology;;   PROSTATE BIOPSY N/A 01/17/2020   Procedure: BIOPSY TRANSRECTAL ULTRASONIC PROSTATE (TUBP);  Surgeon: Twylla Glendia BROCKS, MD;  Location: [facility] ORS;  Service: Urology;  Laterality: N/A;   TRANSTHORACIC ECHOCARDIOGRAM  10/2018   (Most recent echo, to evaluate for TIA/CVA) : Moderate reduced EF 35 to 40%.  Moderate concentric LVH.  Mild dyskinesis of the apex and severe hypokinesis of mid apical anteroseptal and anterior wall.  Unable to assess RV pressures.  Aortic sclerosis with no stenosis.  No significant change seen    SOCIAL HISTORY: Social History   Socioeconomic History   Marital status: Legally Separated    Spouse name: Dorothenia   Number of children: Not on file   Years of education: Not on file   Highest education level: Not on file  Occupational History   Occupation: cook  Tobacco Use   Smoking status: Some Days    Current packs/day: 0.00    Types: Cigarettes   Smokeless tobacco: Never   Tobacco comments:    quit three days ago  Vaping Use   Vaping status: Never Used  Substance and Sexual Activity   Alcohol use: Yes    Alcohol/week: 2.0 standard drinks of alcohol    Types: 2 Cans of beer per week    Comment: 2 Beers daily.   Drug use: Yes    Frequency: 3.0 times per week    Types: Marijuana   Sexual activity: Not Currently  Other Topics Concern   Not on file  Social History Narrative   Currently not working.  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  Now has cancer.   Social Drivers of Corporate investment banker Strain: Low Risk  (08/13/2023)   Overall Financial Resource Strain (CARDIA)    Difficulty of Paying Living Expenses: Not very hard  Food Insecurity: No Food  Insecurity (08/13/2023)   Hunger Vital Sign    Worried About Running Out of Food in the Last Year: Never true    Ran Out of Food in the Last Year: Never true  Transportation Needs: No Transportation Needs (08/13/2023)   PRAPARE - Administrator, Civil Service (Medical): No    Lack of Transportation (Non-Medical): No  Physical Activity: Inactive (08/13/2023)   Exercise Vital Sign    Days of Exercise per Week: 0 days    Minutes of Exercise per Session: 0 min  Stress: No Stress Concern Present (08/13/2023)   [employer] of Occupational Health - Occupational Stress Questionnaire    Feeling of Stress : Not at all  Social Connections: Unknown (08/13/2023)   Social Connection and Isolation Panel    Frequency of Communication with Friends and Family: More than three times a week    Frequency of Social Gatherings with Friends and Family: More than three times a week    Attends Religious Services: More than 4 times per year    Active Member of [employer] or Organizations: No    Attends Banker Meetings: Never    Marital Status: Patient declined  Catering manager Violence: Not At Risk (08/13/2023)   Humiliation, Afraid, Rape, and Kick questionnaire    Fear of Current or Ex-Partner: No    Emotionally Abused: No    Physically Abused: No    Sexually Abused: No    FAMILY HISTORY: Family History  Problem Relation Age of Onset   Stroke Mother    Hypertension Mother    Diabetes type II Mother    Leukemia Other    Breast cancer Other    Stroke Brother    CAD Neg Hx     ALLERGIES:  has no known allergies.  MEDICATIONS:  Current Outpatient Medications  Medication Sig Dispense Refill   clopidogrel  (PLAVIX ) 75 MG tablet Take 1 tablet by mouth once daily 30 tablet 0   docusate sodium  (COLACE) 100 MG capsule Take 1 capsule (100 mg total) by mouth daily. 30 capsule 3   enzalutamide  (XTANDI ) 40 MG tablet Take 4 tablets (160 mg total) by mouth daily. 120 tablet 3   ezetimibe   (ZETIA ) 10 MG tablet Take 1 tablet (10 mg total) by mouth daily. 90 tablet 1   ferrous sulfate  325 (65 FE) MG EC tablet Take 1 tablet (325 mg total) by mouth 2 (two) times daily with a meal. 60 tablet 3   furosemide  (LASIX ) 20 MG tablet TAKE 1 TABLET BY MOUTH ONCE DAILY AS NEEDED FOR SHORTNESS OF BREATH OR SWELLING 90 tablet 0   metoprolol  succinate (TOPROL -XL) 50 MG 24 hr tablet Take 1 tablet (50 mg total) by mouth daily. Take with or immediately following a meal. 15 tablet 0   nitroGLYCERIN  (NITROSTAT ) 0.4 MG SL tablet Place 1 tablet (0.4 mg total) under the tongue every 5 (five) minutes as needed for chest pain. 25 tablet 3   rosuvastatin  (CRESTOR ) 40 MG tablet Take 1 tablet (40 mg total) by mouth daily. 90 tablet 3   sacubitril -valsartan  (ENTRESTO ) 97-103 MG Take 1 tablet by mouth 2 (two) times daily. 180 tablet 2  spironolactone  (ALDACTONE ) 25 MG tablet Take 1 tablet (25 mg total) by mouth daily. 90 tablet 1   tamsulosin  (FLOMAX ) 0.4 MG CAPS capsule Take 1 capsule (0.4 mg total) by mouth daily after supper. 90 capsule 3   traZODone  (DESYREL ) 50 MG tablet Take 0.5-1 tablets (25-50 mg total) by mouth at bedtime as needed for sleep. 30 tablet 6   venlafaxine  XR (EFFEXOR  XR) 37.5 MG 24 hr capsule Take 1 capsule (37.5 mg total) by mouth daily with breakfast. 30 capsule 1   VENTOLIN  HFA 108 (90 Base) MCG/ACT inhaler Inhale 2 puffs into the lungs every 4 (four) hours as needed for wheezing or shortness of breath. 18 g 5   No current facility-administered medications for this visit.   Facility-Administered Medications Ordered in Other Visits  Medication Dose Route Frequency Provider Last Rate Last Admin   leuprolide  (6 Month) (ELIGARD ) injection 45 mg  45 mg Subcutaneous Once Babara Call, MD         PHYSICAL EXAMINATION: ECOG PERFORMANCE STATUS: 1 - Symptomatic but completely ambulatory Vitals:   11/12/23 1059 11/12/23 1105  BP: (!) 164/90 (!) 151/90  Pulse: 77   Resp: 18   Temp: (!) 96.7 F  (35.9 C)   SpO2: 100%    Filed Weights   11/12/23 1059  Weight: 164 lb 3.2 oz (74.5 kg)    Physical Exam Constitutional:      General: He is not in acute distress.    Comments: Thin built.   HENT:     Head: Normocephalic and atraumatic.  Eyes:     General: No scleral icterus. Neck:     Comments: Palpable right cervical mass Cardiovascular:     Rate and Rhythm: Normal rate.  Pulmonary:     Effort: Pulmonary effort is normal. No respiratory distress.  Abdominal:     General: Bowel sounds are normal. There is no distension.     Palpations: Abdomen is soft.  Musculoskeletal:        General: No deformity. Normal range of motion.     Cervical back: Normal range of motion and neck supple.  Skin:    General: Skin is warm and dry.     Findings: No erythema or rash.  Neurological:     Mental Status: He is alert and oriented to person, place, and time. Mental status is at baseline.     Cranial Nerves: No cranial nerve deficit.  Psychiatric:        Mood and Affect: Mood normal.     LABORATORY DATA:  I have reviewed the data as listed    Latest Ref Rng & Units 11/12/2023   10:33 AM 10/07/2023    9:00 AM 08/13/2023   10:04 AM  CBC  WBC 4.0 - 10.5 K/uL 4.5  7.0  5.5   Hemoglobin 13.0 - 17.0 g/dL 87.6  88.0  88.3   Hematocrit 39.0 - 52.0 % 37.4  36.7  36.5   Platelets 150 - 400 K/uL 292  360  249       Latest Ref Rng & Units 11/12/2023   10:34 AM 10/07/2023    9:00 AM 08/13/2023   10:04 AM  CMP  Glucose 70 - 99 mg/dL 92  94  898   BUN 8 - 23 mg/dL 14  9  14    Creatinine 0.61 - 1.24 mg/dL 9.26  9.21  9.22   Sodium 135 - 145 mmol/L 134  140  143   Potassium 3.5 - 5.1 mmol/L 3.5  4.5  3.9   Chloride 98 - 111 mmol/L 100  102  107   CO2 22 - 32 mmol/L 22  20  27    Calcium  8.9 - 10.3 mg/dL 9.6  89.9  9.2   Total Protein 6.5 - 8.1 g/dL 8.0  7.7  6.9   Total Bilirubin 0.0 - 1.2 mg/dL 0.5  0.3  0.3   Alkaline Phos 38 - 126 U/L 56  80  53   AST 15 - 41 U/L 21  15  15    ALT 0 - 44  U/L 10  7  10       RADIOGRAPHIC STUDIES: I have personally reviewed the radiological images as listed and agreed with the findings in the report. No results found.

## 2023-11-12 NOTE — Assessment & Plan Note (Signed)
 Eligard  45mg  Q6 months.  Today and next due in Dec 2025

## 2023-11-12 NOTE — Assessment & Plan Note (Addendum)
#  Germ cell tumor, stage II, status post radiation.  Monitor tumor markers

## 2023-11-12 NOTE — Assessment & Plan Note (Signed)
 Lab Results  Component Value Date   HGB 12.3 (L) 11/12/2023   TIBC 378 08/13/2023   IRONPCTSAT 24 08/13/2023   FERRITIN 41 08/13/2023   S/p IV venofer  treatments. stable Hold off Venofer .  He declines GI work up due to multiple medical problems.  He is also on Plavix 

## 2023-11-12 NOTE — Assessment & Plan Note (Signed)
Check stool studies.  

## 2023-11-12 NOTE — Assessment & Plan Note (Signed)
 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 No RT per Dr. Rushie Chestnut On Xtandi 160mg  in end of March 2024. -Today's PSA is pending. Continue Xtandi.

## 2023-11-12 NOTE — Assessment & Plan Note (Signed)
 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  He is not interested in dental clearance for bisphosphonate. Continue monitor.

## 2023-11-12 NOTE — Progress Notes (Signed)
 Patient here for follow up. Pt reports having Headaches and dizziness for the past 2 weeks. He also has a list of concerns that he will discuss with Dr. Babara.

## 2023-11-13 ENCOUNTER — Ambulatory Visit (HOSPITAL_COMMUNITY)
Admission: RE | Admit: 2023-11-13 | Discharge: 2023-11-13 | Disposition: A | Discharge status: Home / Self Care | Source: Ambulatory Visit | Attending: Emergency Medicine | Admitting: Emergency Medicine

## 2023-11-13 DIAGNOSIS — I5020 Unspecified systolic (congestive) heart failure: Secondary | ICD-10-CM | POA: Insufficient documentation

## 2023-11-13 DIAGNOSIS — I25119 Atherosclerotic heart disease of native coronary artery with unspecified angina pectoris: Secondary | ICD-10-CM | POA: Diagnosis present

## 2023-11-13 LAB — BETA HCG QUANT (REF LAB): hCG Quant: <1 m[IU]/mL (ref 0–3)

## 2023-11-13 LAB — ECHOCARDIOGRAM COMPLETE
Area-P 1/2: 2.62 cm2
S' Lateral: 3.53 cm

## 2023-11-13 LAB — AFP TUMOR MARKER: AFP, Serum, Tumor Marker: 3.5 ng/mL (ref 0.0–8.4)

## 2023-11-13 MED ORDER — PERFLUTREN LIPID MICROSPHERE
1.0000 mL | INTRAVENOUS | Status: AC | PRN
  Administered 2023-11-13: 2 mL via INTRAVENOUS

## 2023-11-18 ENCOUNTER — Ambulatory Visit: Attending: Cardiovascular Disease | Admitting: Emergency Medicine

## 2023-11-18 NOTE — Progress Notes (Deleted)
 Cardiology Office Note:    Date:  11/18/2023  ID:  Tanda Gaskins, DOB 05-09-1960, MRN [id number] PCP: Freddrick Johns  [facility] Providers Cardiologist:  Alm Clay, MD Electrophysiologist:  Will Gladis Norton, MD { Click to update primary MD,subspecialty MD or APP then REFRESH:1}    {Click to Open Review  :1}   Patient Profile:       Chief Complaint: *** History of Present Illness:  Cylan Borum is a 63 y.o. male with visit-pertinent history of  anterior STEMI with PCI to LAD in 2019, ischemic cardiomyopathy with chronic combined systolic and diastolic heart failure, left bundle branch block, CVA, s/p Saint Jude CRT-D implanted 05/2020   He was hospitalized 8/21 and found to have acute CVA and aortic thrombus on imaging.  Brilinta  was changed to Plavix  and was started on Coumadin  at the time.  Imaging was also notable for multiple lytic lesions consistent with metastatic prostate cancer.  He is struggled with poor appetite, weight loss, and orthostatic symptoms related to cancer and treatment.  Due to need for prostate biopsy, Coumadin  was stopped due to resolution of thrombus on repeat imaging.  Biopsy confirmed metastatic seminoma.   He was hospitalized in December 2021 with a GI bleed.  Echocardiogram at that time showed LVEF 35% with mild motion abnormalities consistent with prior LAD infarction.   He is s/p CRT ICD implanted on 05/2020.   Seen by general cardiology on 01/28/2021 by Jon, PA.  Patient was doing well at the time.  No medication changes were made.  He was to follow-up in 6 months.  He was last seen in office by EP on 07/29/2021.  Patient was doing well at the time without cardiovascular concerns or complaint.  No medication changes were made and patient was to follow-up in 1 year.  He was last seen in clinic on 10/07/2023.  He was doing well without chest pains and no new acute cardiovascular concerns.  His blood pressure was elevated at 160/98.  He was unsure if he  was taken home spironolactone .  He was to begin logging blood pressures at home.  Echocardiogram 11/13/2023 showed LVEF 35 to 40%, RWMA, LV internal cavity moderately dilated, mild asymmetric LVH of the basal and septal segments, grade 1 DD mild mitral valve regurgitation, moderate aortic valve regurgitation  Discussed the use of AI scribe software for clinical note transcription with the patient, who gave verbal consent to proceed.  History of Present Illness     Review of systems:  Please see the history of present illness. All other systems are reviewed and otherwise negative. ***      Studies Reviewed:        ***  Risk Assessment/Calculations:   {Does this patient have ATRIAL FIBRILLATION?:[id number]} No BP recorded.  {Refresh Note OR Click here to enter BP  :1}***        Physical Exam:   VS:  There were no vitals taken for this visit.   Wt Readings from Last 3 Encounters:  11/12/23 164 lb 3.2 oz (74.5 kg)  10/08/23 170 lb (77.1 kg)  10/07/23 169 lb 9.6 oz (76.9 kg)    GEN: Well nourished, well developed in no acute distress NECK: No JVD; No carotid bruits CARDIAC: ***RRR, no murmurs, rubs, gallops RESPIRATORY:  Clear to auscultation without rales, wheezing or rhonchi  ABDOMEN: Soft, non-tender, non-distended EXTREMITIES:  No edema; No acute deformity ***      Assessment and Plan:    Assessment and Plan Assessment &  Plan      {Are you ordering a CV Procedure (e.g. stress test, cath, DCCV, TEE, etc)?   Press F2        :[id number]}  Dispo:  No follow-ups on file.  Signed, Lum LITTIE Louis, NP

## 2023-11-27 ENCOUNTER — Other Ambulatory Visit (HOSPITAL_COMMUNITY): Payer: Self-pay

## 2023-12-01 ENCOUNTER — Other Ambulatory Visit: Payer: Self-pay

## 2023-12-01 ENCOUNTER — Other Ambulatory Visit: Payer: Self-pay | Admitting: Pharmacy Technician

## 2023-12-01 NOTE — Progress Notes (Signed)
 Specialty Pharmacy Refill Coordination Note  Daniel Weiss is a 63 y.o. male contacted today regarding refills of specialty medication(s) Enzalutamide  (XTANDI )   Patient requested Delivery   Delivery date: 12/02/23   Verified address: [address]  [place] [address]   Medication will be filled on 12/01/23.

## 2023-12-04 ENCOUNTER — Ambulatory Visit: Payer: Self-pay

## 2023-12-04 DIAGNOSIS — I255 Ischemic cardiomyopathy: Secondary | ICD-10-CM

## 2023-12-04 LAB — CUP PACEART REMOTE DEVICE CHECK
Battery Remaining Longevity: 42 mo
Battery Remaining Percentage: 49 %
Battery Voltage: 2.95 V
Brady Statistic AP VP Percent: 8.5 %
Brady Statistic AP VS Percent: 1 %
Brady Statistic AS VP Percent: 87 %
Brady Statistic AS VS Percent: 4.6 %
Brady Statistic RA Percent Paced: 8.3 %
Date Time Interrogation Session: 20250926065128
HighPow Impedance: 74 Ohm
Implantable Lead Connection Status: 753985
Implantable Lead Connection Status: 753985
Implantable Lead Connection Status: 753985
Implantable Lead Implant Date: 20220330
Implantable Lead Implant Date: 20220330
Implantable Lead Implant Date: 20220330
Implantable Lead Location: 753858
Implantable Lead Location: 753859
Implantable Lead Location: 753860
Implantable Pulse Generator Implant Date: 20220330
Lead Channel Impedance Value: 350 Ohm
Lead Channel Impedance Value: 380 Ohm
Lead Channel Impedance Value: 760 Ohm
Lead Channel Pacing Threshold Amplitude: 0.75 V
Lead Channel Pacing Threshold Amplitude: 0.75 V
Lead Channel Pacing Threshold Amplitude: 1.25 V
Lead Channel Pacing Threshold Pulse Width: 0.5 ms
Lead Channel Pacing Threshold Pulse Width: 0.5 ms
Lead Channel Pacing Threshold Pulse Width: 0.5 ms
Lead Channel Sensing Intrinsic Amplitude: 0.7 mV
Lead Channel Sensing Intrinsic Amplitude: 12 mV
Lead Channel Setting Pacing Amplitude: 2 V
Lead Channel Setting Pacing Amplitude: 2.25 V
Lead Channel Setting Pacing Amplitude: 2.5 V
Lead Channel Setting Pacing Pulse Width: 0.5 ms
Lead Channel Setting Pacing Pulse Width: 0.5 ms
Lead Channel Setting Sensing Sensitivity: 0.5 mV
Pulse Gen Serial Number: 111039291
Zone Setting Status: 755011

## 2023-12-07 ENCOUNTER — Ambulatory Visit: Payer: Self-pay | Admitting: Cardiology

## 2023-12-09 NOTE — Progress Notes (Signed)
 Remote ICD Transmission

## 2023-12-14 ENCOUNTER — Encounter: Payer: Self-pay | Admitting: Emergency Medicine

## 2023-12-14 ENCOUNTER — Ambulatory Visit: Attending: Emergency Medicine | Admitting: Emergency Medicine

## 2023-12-14 VITALS — BP 110/70 | HR 84 | Ht 72.0 in | Wt 167.0 lb

## 2023-12-14 DIAGNOSIS — I5020 Unspecified systolic (congestive) heart failure: Secondary | ICD-10-CM | POA: Insufficient documentation

## 2023-12-14 DIAGNOSIS — I255 Ischemic cardiomyopathy: Secondary | ICD-10-CM | POA: Diagnosis present

## 2023-12-14 DIAGNOSIS — E785 Hyperlipidemia, unspecified: Secondary | ICD-10-CM | POA: Insufficient documentation

## 2023-12-14 DIAGNOSIS — I351 Nonrheumatic aortic (valve) insufficiency: Secondary | ICD-10-CM | POA: Insufficient documentation

## 2023-12-14 DIAGNOSIS — Z79899 Other long term (current) drug therapy: Secondary | ICD-10-CM | POA: Insufficient documentation

## 2023-12-14 DIAGNOSIS — I10 Essential (primary) hypertension: Secondary | ICD-10-CM | POA: Insufficient documentation

## 2023-12-14 DIAGNOSIS — I471 Supraventricular tachycardia, unspecified: Secondary | ICD-10-CM | POA: Diagnosis present

## 2023-12-14 DIAGNOSIS — I340 Nonrheumatic mitral (valve) insufficiency: Secondary | ICD-10-CM | POA: Diagnosis present

## 2023-12-14 DIAGNOSIS — I25119 Atherosclerotic heart disease of native coronary artery with unspecified angina pectoris: Secondary | ICD-10-CM | POA: Diagnosis not present

## 2023-12-14 NOTE — Patient Instructions (Addendum)
 Medication Instructions:  NO CHANGES  Lab Work: FASTING LIPID PANEL AND LFTs TO BE DONE IN 8 WEEKS. (AROUND 02-13-24)  Testing/Procedures: NONE   Follow-Up: At [facility], you and your health needs are our priority.  As part of our continuing mission to provide you with exceptional heart care, our providers are all part of one team.  This team includes your primary Cardiologist (physician) and Advanced Practice Providers or APPs (Physician Assistants and Nurse Practitioners) who all work together to provide you with the care you need, when you need it.  Your next appointment:   6 MONTHS  Provider:   Alm Clay, MD    We recommend signing up for the patient portal called MyChart.  Sign up information is provided on this After Visit Summary.  MyChart is used to connect with patients for Virtual Visits (Telemedicine).  Patients are able to view lab/test results, encounter notes, upcoming appointments, etc.  Non-urgent messages can be sent to your provider as well.   To learn more about what you can do with MyChart, go to [email or web address].   Other Instructions: CALL US  AT [phone] WHEN YOU GET HOME!!!!!!  [facility] 4.6(103)  Doctor [address] mi  [address]  [phone] Open ? Closes 5?PM

## 2023-12-14 NOTE — Progress Notes (Signed)
 Cardiology Office Note:    Date:  12/14/2023  ID:  Daniel Weiss, DOB [birth year], MRN [id number] PCP: Freddrick Johns  [facility] Providers Cardiologist:  Alm Clay, MD Electrophysiologist:  Will Gladis Norton, MD       Patient Profile:       Chief Complaint: 6-week follow-up for hypertension History of Present Illness:  Daniel Weiss is a 63 y.o. male with visit-pertinent history of  anterior STEMI with PCI to LAD in 2019, ischemic cardiomyopathy with chronic combined systolic and diastolic heart failure, left bundle branch block, CVA, s/p Saint Jude CRT-D implanted 05/2020   He was hospitalized 8/21 and found to have acute CVA and aortic thrombus on imaging.  Brilinta  was changed to Plavix  and was started on Coumadin  at the time.  Imaging was also notable for multiple lytic lesions consistent with metastatic prostate cancer.  He is struggled with poor appetite, weight loss, and orthostatic symptoms related to cancer and treatment.  Due to need for prostate biopsy, Coumadin  was stopped due to resolution of thrombus on repeat imaging.  Biopsy confirmed metastatic seminoma.   He was hospitalized in December 2021 with a GI bleed.  Echocardiogram at that time showed LVEF 35% with mild motion abnormalities consistent with prior LAD infarction.   He is s/p CRT ICD implanted on 05/2020.   Seen in office on 01/28/2021 by Jon, [place].  Patient was doing well at the time.  No medication changes were made.  Seen by EP on 07/29/2021.  Patient was doing well at the time without cardiovascular concerns or complaint.  No medication changes were made.  He was last seen by general cardiology on 10/07/2023.  He has not been seen by cardiology in around 2-1/2 years.  He is without active anginal symptoms.  He tells me he is unsure if he has been taking his ezetimibe  and spironolactone .  His last LDL was 101 on 02/2020.  His ezetimibe  was refilled and he was continued on rosuvastatin  40 mg daily.  His blood  pressure was elevated at 160/98.  His spironolactone  was refilled he was continued on metoprolol  XL 50 mg daily, Entresto  97-103 mg twice daily.  Reports stable DOE since MI in 2019.  Currently taking Lasix  daily.  He underwent echocardiogram on 11/13/2023 with LVEF 35 to 40%, septal, apical inferior, apical and distal anterior wall akinesis.  LV internal cavity size mildly dilated, mild LVH of the basal and septal segments, grade 1 DD, RV normal, left atrial size mildly dilated, mild mitral valve regurgitation, moderate aortic valve regurgitation.   Discussed the use of AI scribe software for clinical note transcription with the patient, who gave verbal consent to proceed.  History of Present Illness Daniel Weiss is a 63 year old male with heart failure and coronary artery disease who presents for follow-up.  Today patient is doing well without acute cardiovascular concerns.  He denies any chest pains.  He is on metoprolol , Entresto , spironolactone , and Lasix , with regular adherence. He is uncertain about his use of Zetia  but reports he does take Crestor . He also takes Plavix  daily. He experiences occasional dizziness, particularly when transitioning from sitting to standing, and occasional shortness of breath, which has been stable. His LDL level is 108 mg/dL, and he avoids junk food and salt.  He lives alone, does not drive, and relies on transportation services. His lifestyle is mostly sedentary, with occasional walks and rides with a friend. He wants to be more active and recognizes the importance of exercise  for heart health.  No orthopnea, PND, LEE, syncope, presyncope   Review of systems:  Please see the history of present illness. All other systems are reviewed and otherwise negative.      Studies Reviewed:        Echocardiogram 11/13/2023 1. Septal, apical inferior apical and distal anterior wall akinesis No  mural apical thrombus with definity . Left ventricular ejection fraction,  by  estimation, is 35 to 40%. The left ventricle has moderately decreased  function. The left ventricle  demonstrates regional wall motion abnormalities (see scoring  diagram/findings for description). The left ventricular internal cavity  size was moderately dilated. There is mild asymmetric left ventricular  hypertrophy of the basal and septal segments. Left   ventricular diastolic parameters are consistent with Grade I diastolic  dysfunction (impaired relaxation).   2. Device leads in RA/RV. Right ventricular systolic function is normal.  The right ventricular size is normal.   3. Left atrial size was mildly dilated.   4. The mitral valve is abnormal. Mild mitral valve regurgitation. No  evidence of mitral stenosis.   5. The aortic valve is tricuspid. There is mild calcification of the  aortic valve. There is mild thickening of the aortic valve. Aortic valve  regurgitation is moderate. Aortic valve sclerosis is present, with no  evidence of aortic valve stenosis.   6. The inferior vena cava is normal in size with greater than 50%  respiratory variability, suggesting right atrial pressure of 3 mmHg.   Echocardiogram 01/15/2022 1. Akinesis of the distal anteroseptal wall and apex with overall  moderate LV dysfunction.   2. Left ventricular ejection fraction, by estimation, is 35 to 40%. The  left ventricle has moderately decreased function. The left ventricle  demonstrates regional wall motion abnormalities (see scoring  diagram/findings for description). Left ventricular   diastolic parameters are consistent with Grade I diastolic dysfunction  (impaired relaxation).   3. Right ventricular systolic function is normal. The right ventricular  size is normal. Tricuspid regurgitation signal is inadequate for assessing  PA pressure.   4. The mitral valve is normal in structure. Mild mitral valve  regurgitation. No evidence of mitral stenosis.   5. The aortic valve is tricuspid. Aortic valve  regurgitation is mild to  moderate. No aortic stenosis is present.   6. The inferior vena cava is normal in size with greater than 50%  respiratory variability, suggesting right atrial pressure of 3 mmHg.    Echocardiogram 02/23/2020  1. Akinesis of the distal anteroseptal and apical walls with overall  moderate to severe LV dysfunction.   2. Left ventricular ejection fraction, by estimation, is 30 to 35%. The  left ventricle has moderate to severely decreased function. The left  ventricle demonstrates regional wall motion abnormalities (see scoring  diagram/findings for description). Left  ventricular diastolic parameters are consistent with Grade II diastolic  dysfunction (pseudonormalization).   3. Right ventricular systolic function is normal. The right ventricular  size is normal.   4. The mitral valve is normal in structure. No evidence of mitral valve  regurgitation. No evidence of mitral stenosis.   5. The aortic valve has an indeterminant number of cusps. Aortic valve  regurgitation is trivial. No aortic stenosis is present.   6. The inferior vena cava is normal in size with greater than 50%  respiratory variability, suggesting right atrial pressure of 3 mmHg.   Lexiscan  Myoview  12/06/2019 The left ventricular ejection fraction is moderately decreased (30-44%). Nuclear stress EF: 35%. Defect 1:  There is a large defect of moderate severity present in the mid anterior, mid anteroseptal, apical anterior, apical septal, apical inferior and apex location. Findings consistent with prior myocardial infarction. No ischemia. This is an intermediate risk study due to reduced systolic function.  Cardiac catheterization 03/06/2018 There is moderate to severe left ventricular systolic dysfunction. LV end diastolic pressure is mildly elevated. The left ventricular ejection fraction is 25-35% by visual estimate. There is trivial (1+) mitral regurgitation. Prox LAD lesion is 85% stenosed  with 45% stenosed side branch in Ost 1st Diag. Post intervention, there is a 0% residual stenosis. Post intervention, the side branch was reduced to 45% residual stenosis. A drug-eluting stent was successfully placed using a STENT SIERRA 3.50 X 15 MM. Ost Ramus lesion is 55% stenosed.   SUMMARY: Severe (80-85%) single-vessel disease of the proximal-mid LAD just after D1 with mild D1 involvement treated with Xience DES 3.5 mm x 15 mm (3.9 mm) Moderate ostial ramus intermedius disease and mild to moderate ostial D1 disease. Moderate to severely reduced LVEF with mid anterior and inferior severe hypokinesis and apical inferior and anterior akinesis -consistent with a recent anterior MI. Despite reduced EF, minimally elevated LV EDP. Diagnostic Dominance: Right  Intervention   Risk Assessment/Calculations:              Physical Exam:   VS:  BP 110/70 (BP Location: Left Arm, Patient Position: Sitting, Cuff Size: Normal)   Pulse 84   Ht 6' (1.829 m)   Wt 167 lb (75.8 kg)   SpO2 98%   BMI 22.65 kg/m    Wt Readings from Last 3 Encounters:  12/14/23 167 lb (75.8 kg)  11/12/23 164 lb 3.2 oz (74.5 kg)  10/08/23 170 lb (77.1 kg)    GEN: Well nourished, well developed in no acute distress NECK: No JVD; No carotid bruits CARDIAC: RRR, no murmurs, rubs, gallops RESPIRATORY:  Clear to auscultation without rales, wheezing or rhonchi  ABDOMEN: Soft, non-tender, non-distended EXTREMITIES:  No edema; No acute deformity      Assessment and Plan:  Coronary artery disease S/p anterior STEMI with DES to LAD and PTCA of diagonal branch in 2019.  Was delayed presentation with significant infarct Subsequent CCTA on 10/2018 was unremarkable Lexiscan  Myoview  11/2019 consistent with prior MI and no ischemia - Today patient denies chest pains.  Is fairly sedentary but walks occasionally without exertional symptoms.  Has chronic DOE that is unchanged.  There is no indication for further ischemic  evaluation at this time - Continue clopidogrel  75 mg daily, ezetimibe  10 mg daily, rosuvastatin  40 mg daily, metoprolol  XL 50 mg daily   Hyperlipidemia, LDL goal <55 LDL 108 on 09/2023 and not well-controlled Reports still unsure if he is taking ezetimibe  although it was refilled at last OV.  Did not bring medications into OV today - Plan for fasting lipid panel/LFTs today - Continue ezetimibe  10 mg daily and rosuvastatin  40 mg daily   Hypertension Blood pressure today is 110/70 and well-controlled Last OV on 7/30 BP elevated at 160/98.  Was not taking spironolactone  and was refilled - Continue metoprolol  XL 50 mg daily, Entresto  97-103 mg twice daily, spironolactone  25 mg daily   Chronic systolic heart failure Ischemic cardiomyopathy S/p CRT-D on 05/2020 Echocardiogram 02/2020 with LVEF 30 to 35% Echocardiogram 07/2021 with LVEF 35 to 40% Echocardiogram 11/2023 with LVEF 35 to 40% NYHA class II due to chronic dyspnea - Today he appears euvolemic and well compensated on exam.  Volume status  is stable.  No orthopnea, PND, leg swelling.   - Chronic DOE does limit his daily activities.  Reports dyspnea is not progressive and has been stable - Continue current GDMT of metoprolol  XL 50 mg daily, Entresto  97-103 mg twice daily, spironolactone  25 mg daily, Lasix  20 mg daily   Moderate AR Mild MR Echocardiogram 11/2023 with mild mitral valve regurgitation and moderate aortic valve regurgitation - His symptoms are well-controlled and stable - There is no indication for further intervention at this time   SVT Only minimally symptomatic - Management per EP - Continue metoprolol  XL 50 mg daily   Metastatic prostate cancer - Managed on Xtandi  by oncology      Dispo:  Return in about 6 months (around 06/13/2024).  Signed, Lum LITTIE Louis, NP

## 2023-12-24 ENCOUNTER — Other Ambulatory Visit (HOSPITAL_COMMUNITY): Payer: Self-pay

## 2023-12-28 ENCOUNTER — Other Ambulatory Visit: Payer: Self-pay

## 2023-12-28 ENCOUNTER — Other Ambulatory Visit (HOSPITAL_COMMUNITY): Payer: Self-pay

## 2023-12-28 NOTE — Progress Notes (Signed)
 Specialty Pharmacy Refill Coordination Note  Daniel Weiss is a 63 y.o. male contacted today regarding refills of specialty medication(s) Enzalutamide  (XTANDI )   Patient requested Delivery   Delivery date: 12/29/23   Verified address: [address]  [place] [address]   Medication will be filled on 12/28/23.

## 2023-12-30 ENCOUNTER — Other Ambulatory Visit: Payer: Self-pay

## 2023-12-30 NOTE — Progress Notes (Signed)
 Specialty Pharmacy Ongoing Clinical Assessment Note  Daniel Weiss is a 63 y.o. male who is being followed by the specialty pharmacy service for RxSp Oncology   Patient's specialty medication(s) reviewed today: Enzalutamide  (XTANDI )   Missed doses in the last 4 weeks: 0   Patient/Caregiver did not have any additional questions or concerns.   Therapeutic benefit summary: Patient is achieving benefit   Adverse events/side effects summary: No adverse events/side effects   Patient's therapy is appropriate to: Continue    Goals Addressed             This Visit's Progress    Slow Disease Progression   On track    Patient is on track. Patient will maintain adherence. PSA is improving and was last 0.02 ng/mL on 11/12/23.        Follow up: 6 months  [facility]

## 2024-01-03 ENCOUNTER — Other Ambulatory Visit: Payer: Self-pay | Admitting: Cardiology

## 2024-01-20 ENCOUNTER — Other Ambulatory Visit: Payer: Self-pay

## 2024-01-20 ENCOUNTER — Other Ambulatory Visit: Payer: Self-pay | Admitting: Oncology

## 2024-01-20 DIAGNOSIS — C61 Malignant neoplasm of prostate: Secondary | ICD-10-CM

## 2024-01-20 MED ORDER — ENZALUTAMIDE 40 MG PO TABS
160.0000 mg | ORAL_TABLET | Freq: Every day | ORAL | 3 refills | Status: AC
  Filled 2024-01-20 – 2024-01-21 (×2): qty 120, 30d supply, fill #0
  Filled 2024-02-22: qty 120, 30d supply, fill #1
  Filled 2024-03-18: qty 120, 30d supply, fill #2
  Filled 2024-04-15: qty 120, 30d supply, fill #3

## 2024-01-21 ENCOUNTER — Other Ambulatory Visit (HOSPITAL_COMMUNITY): Payer: Self-pay

## 2024-01-21 ENCOUNTER — Other Ambulatory Visit: Payer: Self-pay

## 2024-01-21 NOTE — Progress Notes (Signed)
 Specialty Pharmacy Refill Coordination Note  Daniel Weiss is a 64 y.o. male contacted today regarding refills of specialty medication(s) Enzalutamide  (XTANDI )   Patient requested Delivery   Delivery date: 01/27/24   Verified address: [address]  [name] [address]   Medication will be filled on: 01/26/24

## 2024-02-17 ENCOUNTER — Inpatient Hospital Stay: Admitting: Oncology

## 2024-02-17 ENCOUNTER — Other Ambulatory Visit: Payer: Self-pay

## 2024-02-17 ENCOUNTER — Encounter: Payer: Self-pay | Admitting: Oncology

## 2024-02-17 ENCOUNTER — Ambulatory Visit: Payer: Self-pay | Admitting: Oncology

## 2024-02-17 ENCOUNTER — Ambulatory Visit

## 2024-02-17 ENCOUNTER — Inpatient Hospital Stay: Attending: Oncology

## 2024-02-17 VITALS — BP 149/82 | HR 72 | Temp 97.6°F | Resp 18 | Wt 164.4 lb

## 2024-02-17 DIAGNOSIS — R202 Paresthesia of skin: Secondary | ICD-10-CM

## 2024-02-17 DIAGNOSIS — D508 Other iron deficiency anemias: Secondary | ICD-10-CM

## 2024-02-17 DIAGNOSIS — Z79899 Other long term (current) drug therapy: Secondary | ICD-10-CM | POA: Insufficient documentation

## 2024-02-17 DIAGNOSIS — C801 Malignant (primary) neoplasm, unspecified: Secondary | ICD-10-CM

## 2024-02-17 DIAGNOSIS — C61 Malignant neoplasm of prostate: Secondary | ICD-10-CM | POA: Insufficient documentation

## 2024-02-17 DIAGNOSIS — C6290 Malignant neoplasm of unspecified testis, unspecified whether descended or undescended: Secondary | ICD-10-CM | POA: Diagnosis not present

## 2024-02-17 DIAGNOSIS — C7951 Secondary malignant neoplasm of bone: Secondary | ICD-10-CM | POA: Insufficient documentation

## 2024-02-17 DIAGNOSIS — Z79818 Long term (current) use of other agents affecting estrogen receptors and estrogen levels: Secondary | ICD-10-CM | POA: Diagnosis not present

## 2024-02-17 DIAGNOSIS — Z923 Personal history of irradiation: Secondary | ICD-10-CM | POA: Insufficient documentation

## 2024-02-17 LAB — CBC WITH DIFFERENTIAL (CANCER CENTER ONLY)
Abs Immature Granulocytes: 0.04 K/uL (ref 0.00–0.07)
Basophils Absolute: 0.1 K/uL (ref 0.0–0.1)
Basophils Relative: 1 %
Eosinophils Absolute: 0.1 K/uL (ref 0.0–0.5)
Eosinophils Relative: 1 %
HCT: 34.8 % — ABNORMAL LOW (ref 39.0–52.0)
Hemoglobin: 11.7 g/dL — ABNORMAL LOW (ref 13.0–17.0)
Immature Granulocytes: 1 %
Lymphocytes Relative: 25 %
Lymphs Abs: 2 K/uL (ref 0.7–4.0)
MCH: 26.8 pg (ref 26.0–34.0)
MCHC: 33.6 g/dL (ref 30.0–36.0)
MCV: 79.6 fL — ABNORMAL LOW (ref 80.0–100.0)
Monocytes Absolute: 1 K/uL (ref 0.1–1.0)
Monocytes Relative: 13 %
Neutro Abs: 4.9 K/uL (ref 1.7–7.7)
Neutrophils Relative %: 59 %
Platelet Count: 306 K/uL (ref 150–400)
RBC: 4.37 MIL/uL (ref 4.22–5.81)
RDW: 17.1 % — ABNORMAL HIGH (ref 11.5–15.5)
WBC Count: 7.9 K/uL (ref 4.0–10.5)
nRBC: 0 % (ref 0.0–0.2)

## 2024-02-17 LAB — IRON AND TIBC
Iron: 89 ug/dL (ref 45–182)
Saturation Ratios: 19 % (ref 17.9–39.5)
TIBC: 468 ug/dL — ABNORMAL HIGH (ref 250–450)
UIBC: 379 ug/dL

## 2024-02-17 LAB — CMP (CANCER CENTER ONLY)
ALT: 6 U/L (ref 0–44)
AST: 18 U/L (ref 15–41)
Albumin: 4.2 g/dL (ref 3.5–5.0)
Alkaline Phosphatase: 76 U/L (ref 38–126)
Anion gap: 14 (ref 5–15)
BUN: 9 mg/dL (ref 8–23)
CO2: 24 mmol/L (ref 22–32)
Calcium: 9.5 mg/dL (ref 8.9–10.3)
Chloride: 102 mmol/L (ref 98–111)
Creatinine: 0.79 mg/dL (ref 0.61–1.24)
GFR, Estimated: >60 mL/min (ref >60)
Glucose, Bld: 124 mg/dL — ABNORMAL HIGH (ref 70–99)
Potassium: 4 mmol/L (ref 3.5–5.1)
Sodium: 140 mmol/L (ref 135–145)
Total Bilirubin: 0.3 mg/dL (ref 0.0–1.2)
Total Protein: 7.3 g/dL (ref 6.5–8.1)

## 2024-02-17 LAB — PSA: Prostatic Specific Antigen: <0.02 ng/mL (ref 0.00–4.00)

## 2024-02-17 LAB — FERRITIN: Ferritin: 19 ng/mL — ABNORMAL LOW (ref 24–336)

## 2024-02-17 MED ORDER — LEUPROLIDE ACETATE (6 MONTH) 45 MG ~~LOC~~ KIT
45.0000 mg | PACK | Freq: Once | SUBCUTANEOUS | Status: AC
  Administered 2024-02-17: 45 mg via SUBCUTANEOUS
  Filled 2024-02-17: qty 45

## 2024-02-17 NOTE — Progress Notes (Signed)
 Hematology/Oncology Progress note [phone] [phone]   CHIEF COMPLAINTS/REASON FOR VISIT:  Follow up for prostate cancer and germ cell tumor, IDA  ASSESSMENT & PLAN:   Cancer Staging  Prostate cancer [facility]) Staging form: Prostate, AJCC 8th Edition - Clinical stage from 01/17/2020: Stage IVB (cT2c, cN0, cM1, Grade Group: 4) - Signed by Babara Call, MD on 03/16/2020   Prostate cancer 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 No RT per Dr. Lenn On Xtandi  160mg  in end of March 2024. -Today's PSA is pending. Continue Xtandi .  Encounter for monitoring androgen deprivation therapy Eligard  45mg  Q6 months.  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  Monitor tumor markers    IDA (iron  deficiency anemia) Lab Results  Component Value Date   HGB 11.7 (L) 02/17/2024   TIBC 468 (H) 02/17/2024   IRONPCTSAT 19 02/17/2024   FERRITIN 19 (L) 02/17/2024   S/p IV venofer  treatments. Decreased iron  level. Recommend Venofer  weekly x 2 He declines GI work up due to multiple medical problems.  He is also on Plavix   Metastatic cancer to bone [facility]) 2024 PMSA showed no bone metastatic disease.  He is not interested in dental clearance for bisphosphonate. Continue monitor.    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, myelomalacia Refer to neurology  Orders Placed This Encounter  Procedures   CBC with Differential (Cancer Center Only)    Standing Status:   Future    Expected Date:   05/17/2024    Expiration Date:   08/15/2024   CMP (Cancer Center only)    Standing Status:   Future    Expected Date:   05/17/2024    Expiration Date:   08/15/2024   Lactate dehydrogenase    Standing Status:   Future    Expected Date:   05/17/2024    Expiration Date:   08/15/2024   Iron  and TIBC    Standing  Status:   Future    Expected Date:   05/17/2024    Expiration Date:   08/15/2024   Ferritin    Standing Status:   Future    Expected Date:   05/17/2024    Expiration Date:   08/15/2024   PSA    Standing Status:   Future    Expected Date:   05/17/2024    Expiration Date:   08/15/2024   Beta HCG, Quant (tumor marker)    Standing Status:   Future    Expected Date:   05/17/2024    Expiration Date:   08/15/2024   AFP tumor marker    Standing Status:   Future    Expected Date:   05/17/2024    Expiration Date:   08/15/2024   Ferritin    Standing Status:   Future    Number of Occurrences:   1    Expected Date:   02/17/2024    Expiration Date:   05/17/2024   Iron  and TIBC    Standing Status:   Future    Number of Occurrences:   1    Expected Date:   02/17/2024    Expiration Date:   05/17/2024   Amb Referral to Neuro Oncology    Referral Priority:   Routine    Referral Type:   Consultation    Referral Reason:   Specialty Services Required    Number of  Visits Requested:   1   Follow up in 3 months.  All questions were answered. The patient knows to call the clinic with any problems, questions or concerns.  Zelphia Cap, MD, PhD [facility] Hematology Oncology 02/17/2024   HISTORY OF PRESENTING ILLNESS:  Oncology History  Prostate cancer [facility])  12/25/2012 Tumor Marker   PSA 0.11   03/10/2018 Initial Diagnosis   Prostate cancer [facility])  Patient was diagnosed with presumed prostate cancer on 10/2018 with a PSA of 509, incidental findings of multiple sclerotic lesions on CT-chest abdomen pelvis during work-up for a CVA.  His previous urology care was with Dr. Sherrilee. 11/05/2018 bone scan showed solitary punctate focus of activity in the left first sacral segment corresponding to a 9 mm mixed lytic and sclerotic metastatic's on recent CT.  The remaining numerous sclerotic osseous metastasis identified on CT do not demonstrate abnormal activity and are therefore healed.  At that time No tissue  diagnosis/biopsy was obtained. Patient was started with Lupron  and Casodex Lupron  was eventually to Eligard  [facility], PSA was 13.13, testosterone  level less than 10. Casodex was discontinued in June 2021 10/26/2019 patient switched to [facility] urology and was seen by Dr. Twylla Per urology note, PSA trend: 11/03/2018: 502 11/22/2018: 377 02/21/2019: 387 04/25/2019: 123 09/05/2019: 13.3 Patient was continued on Eligard  45mg  Q6 months.    10/06/2019 Imaging   bone scan showed resolution of punctated activity over the left sacrum. The punctate area of increased activity over the right lower lumbar spine again noted.    10/31/2019 Imaging   CT angio chest aorta showed no evidence of aortic aneurysm or dissection.  Atherosclerotic calcification spleen seen in the upper abdominal aorta.  Chronic artery disease.  No acute cardiopulmonary disease. Patient has a chronic right neck mass.  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    11/18/2019 Imaging   11/18/2019 CT abdomen pelvis with contrast showed retrocaval lymphadenopathy in the right common and external iliac chains.  Stable to minimally progressed in the interval since last CT scan 11/03/2018 Numerous sclerotic bone lesions.  Stable 3 mm subpleural right middle lobe lung nodule stable.  Aortic atherosclerosis   11/28/2019 Procedure   Paracaval lymph node biopsy showed metastatic neoplasm,consistent with metastatic germ cell tumor, morphologically compatible with seminoma, and his case was reviewed on tumor board.  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 Appearance was consistent with primary testicular neoplasm. Normal size and appearance of the left testicle    01/17/2020 Surgery   S/p right orchiectomy  residual germ cell tumor and germ cell neoplasia in situ are not identified.  Case was discussed on tumor board.  Consensus reached a point although residual germ cell tumor/infection was not identified, the presence of scar with extensive tubular atrophy is compatible with a completely regressed germ cell tumor.   He went to [facility] for second opinion as I recommended. Due to long waiting time, he left without being seen. Dr.Rush from [facility]  called and agrees with patient current treatment.    03/14/2020 Imaging   PET  . - small hypermetabolic lymph nodes along the RIGHT iliac vessels is most consistent with nodal metastasis.    04/12/2020 - 05/08/2020 Radiation Therapy   radiation to periaortic lymph nodes as well as right hemipelvis node.    12/19/2020 Imaging   CT chest abdomen pelvis showed interval resolution of retrocaval and right iliac lymphadenotomy. No new or progressive disease. Stable sclerotic lesions. Non obstructing bilateral nephrolithiasis.    06/24/2021 Tumor Marker   PSA 1.80    11/08/2021 Tumor Marker   PSA 2.9    11/14/2021 Imaging   CT chest abdomen pelvis w contrast  No new or progressive findings on today's examination.   Unchanged multiple small sclerotic lesions in the thoracic and lumbar spine as well as the pelvis. Stable 4 mm right middle lobe nodule. Recommend continued attention on follow-up. Aortic Atherosclerosis   12/17/2021 Tumor Marker   PSA 3.27   05/06/2022 Tumor Marker   PSA 5.75   05/29/2022 Imaging   PMSA PET showed 1. Intense radiotracer activity within the prostate gland consistent with primary prostate adenocarcinoma. 2. Resolution of RIGHT iliac  adenopathy. 3. No evidence of metastatic adenopathy in the pelvis or periaortic retroperitoneum. 4. No evidence of visceral metastasis or skeletal metastasis. 5. Bilobed hypodense mass in the RIGHT neck is stable over multiple comparison exams including FDG PET scan and CT neck 11/03/2018   05/2022 -  Chemotherapy   Started on Xtandi  160mg  daily.    Metastatic cancer to bone [facility])     Patient has extensive cardiology issues.  He follows up with Dr. Anner. Patient has history of STEMI-PCI to LAD with resultant ischemia cardiomyopathy, EF 35 to 40%, left bundle branch block with acute CVA-10/2018.  CT coronary showed nonobstructing plaquing of coronary distributions, intimal irregularity with filling defect in the ascending aorta but no LV thrombus.  And the patient is on warfarin and Plavix .  Patient was seen by thoracic surgeon Dr. Dusty will recommend patient to be on Coumadin  for minimum of 3 months.  With presentation with stroke, Dr. Anner recommend at least 6 to 12 months of Coumadin . -Coumadin  was discontinued by Dr. Anner in September 2021 and switched to.  Plavix  Patient lives with his sister.  He has 3 adult children.  His activity is quite limited due to shortness of breath with exertion due to cardiology problems.  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  Patient has an ENT evaluation.  Status post biopsy- WARTHIN TUMOR.  Negative for atypia and malignancy. # He has had CRT-D implant.  # 12/19/2020 CT chest abdomen pelvis showed interval resolution of retrocaval and right iliac lymphadenotomy. No new or progressive disease. Stable sclerotic lesions. Non obstructing bilateral nephrolithiasis.  #06/14/2021, CT neck soft tissue with contrast showed multiple bilateral parotid masses, largest lesion on the right has increased the size, now 5.1 x 2.9 x 4.9 cm.  Previously 4.1 cm in 2016.  INTERVAL HISTORY Daniel Weiss is a 63 y.o. male who has above history  reviewed by me today presents for follow up visit for management of metastatic prostate cancer and germ cell tumor.   Discussed the use of AI scribe software for clinical note transcription with the patient, who gave verbal consent to proceed.   Patient has been on Xtandi  since March 2024.  He tolerates well. He was hospitalized in early November for four days due to stroke-like symptoms, including numbness  in both upper extremities. Since discharge, he continues to experience mild persistent numbness and paresthesia localized to the right hand, described as minimal numbness in the fingers, without new or progressive neurological deficits. CT scan demonstrates no acute intracranial abnormality. MRI of the brain did not show any evidence of acute stroke. MRI spine showed patient was complaining of tingling MRI of the spine was also done which showed severe canal stenosis at C4-C5 and C5-C6 with flattening of the cord and right eccentric cord signal abnormality likely representing myelomalacia. Multilevel lumbar spinal canal stenosis.   Neurology and neurosurgery were consulted, and recommendations included outpatient neurology/neurosurgery  follow-up and continuation of aspirin  and statin therapy. He is uncertain if he has followed up with neurology since discharge and has an upcoming cardiology appointment.   Review of Systems  Constitutional:  Positive for fatigue. Negative for appetite change, chills, fever and unexpected weight change.  HENT:   Negative for hearing loss and voice change.        Chronic neck mass  Eyes:  Negative for eye problems and icterus.  Respiratory:  Negative for chest tightness, cough and shortness of breath.   Cardiovascular:  Negative for chest pain and leg swelling.  Gastrointestinal:  Negative for abdominal distention and abdominal pain.  Endocrine: Positive for hot flashes.  Genitourinary:  Negative for difficulty urinating, dysuria and frequency.    Musculoskeletal:  Negative for arthralgias.  Skin:  Negative for itching and rash.  Neurological:  Negative for dizziness, light-headedness and numbness.       Right upper extremity numbness tingling.   Hematological:  Negative for adenopathy. Does not bruise/bleed easily.  Psychiatric/Behavioral:  Negative for confusion.     MEDICAL HISTORY:  Past Medical History:  Diagnosis Date   Abnormal chest CT 02/2018   a. Ectatic 3 cm Ao arch - rec f/u outpt imaging w/ CTA or MRA. Ectatic atheromatous abd Ao @ risk for aneurysm - rec f/u u/s in 5 yrs. Marked prostatic enlargement w/ scattered small sclerotic foci in the thoracic lower lumbar spine, sacrum, left 12th rib, pelvis, and right femur.  Recommend elective outpatient whole-body bone scintigraphy and PSA.   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 02/2018 ACS/PCI: LM nl, LAD 85p (3.5x15 Sierra DES), D1 45ost, RI 55ost, RCA nl, EF 25-35%.   Cardiac murmur    Chest pain on exertion 11/28/2019   Chronic combined systolic and diastolic CHF, NYHA class 2 (HCC) 04/17/2020   Complete left bundle branch block (LBBB) 2016   Coronary artery disease involving native coronary artery of native heart with angina pectoris 03/10/2018   Dyspnea    Encephalopathy, hypertensive    Essential hypertension 07/07/2014   Germ cell tumor (HCC) 12/22/2019   Goals of care, counseling/discussion 12/22/2019   Hematemesis without nausea    HFrEF (heart failure with reduced ejection fraction) (HCC)    History of CVA (cerebrovascular accident) 11/23/2018   History of non-ST elevation myocardial infarction (NSTEMI) 02/21/2019   Hypertension    Hypokalemia    Hyponatremia 06/29/2015   Ischemic cardiomyopathy 02/2018   a.  02/2018 Echo: EF 35-40% (LV gram on cath was 25-30%), mod, diff HK. mid-apicalanteroseptal, ant, and apical sev HK. RVH.-->  No notable change on echo from June 2020 and August 2020.   Localized swelling, mass or lump of neck    Long term (current) use of anticoagulants 11/16/2018   Malnutrition of moderate degree 11/11/2018   Mass of right side of neck 07/07/2014   Myocardial infarction [facility])    Neck mass    Right Neck mass - US  neck showed 6.3 cm complex, partially cystic, partially solid mass.  CT neck showed 2 cm mass from parotid gland on right, with 3x4x5cm  Possible metastatic lymphadenopathy.  we recommended following up with Dr. Carlie ENT for biopsy of this.      Non-ST elevation (NSTEMI) myocardial infarction (HCC) 03/06/2018   NSTEMI (non-ST elevated myocardial infarction) (HCC) 02/2018   85% pLAD - DES PCI    Paresthesia 07/07/2014   Prostate cancer (HCC) 03/10/2018   Prostate cancer metastatic to multiple sites [facility]) 11/2018   Right sided weakness 07/07/2014   Stroke [facility])    TIA (transient ischemic attack) 2016   Warthin's tumor     SURGICAL HISTORY: Past Surgical History:  Procedure Laterality Date   BIV ICD INSERTION CRT-D N/A 06/06/2020   Procedure: BIV ICD INSERTION CRT-D;  Surgeon: Inocencio Soyla Lunger, MD;  Location: [facility] INVASIVE CV LAB;  Service: Cardiovascular;  Laterality: N/A;   COLONOSCOPY WITH PROPOFOL  N/A 02/27/2020   Procedure: COLONOSCOPY WITH PROPOFOL ;  Surgeon: Leigh Elspeth SQUIBB, MD;  Location: [facility] ENDOSCOPY;  Service: Gastroenterology;  Laterality: N/A;   CORONARY STENT INTERVENTION N/A 03/06/2018   Procedure: CORONARY STENT INTERVENTION;  Surgeon: Anner Alm ORN, MD;  Location: [facility] INVASIVE CV LAB;  Service: Cardiovascular: Proximal LAD 85% stenosis (and D1): DES PCI with Xience Sierra DES 3.5 mm x 15 mm - 3.9 mm   ESOPHAGOGASTRODUODENOSCOPY (EGD) WITH PROPOFOL  N/A 02/27/2020   Procedure: ESOPHAGOGASTRODUODENOSCOPY (EGD) WITH PROPOFOL ;  Surgeon: Leigh Elspeth SQUIBB, MD;  Location: [facility] ENDOSCOPY;  Service: Gastroenterology;  Laterality: N/A;   HEMOSTASIS CLIP PLACEMENT  02/27/2020   Procedure: HEMOSTASIS CLIP PLACEMENT;  Surgeon: Leigh Elspeth SQUIBB, MD;  Location: [facility] ENDOSCOPY;  Service: Gastroenterology;;   HOT HEMOSTASIS N/A 02/27/2020   Procedure: HOT HEMOSTASIS (ARGON PLASMA COAGULATION/BICAP);  Surgeon: Leigh Elspeth SQUIBB, MD;  Location: [facility] ENDOSCOPY;  Service: Gastroenterology;  Laterality: N/A;   INTRAOPERATIVE TRANSTHORACIC ECHOCARDIOGRAM  03/06/2018   (Peri-MI) mild reduced EF 35-40%.  Diffuse hypokinesis (severe HK of the mid-apical anteroseptal, anterior and apical myocardium consistent with LAD infarct).  Unable to assess diastolic function.  Paradoxical septal motion likely related to his LBBB.  RV hypertrophy noted.  Trivial pericardial effusion noted.    LEFT HEART CATH AND CORONARY ANGIOGRAPHY N/A 03/06/2018   Procedure: LEFT HEART CATH AND CORONARY ANGIOGRAPHY;  Surgeon: Anner Alm ORN, MD;  Location: [facility] INVASIVE CV LAB;  Service: Cardiovascular: Proximal LAD 85% involving D1 45%.  Ostial RI 55%.  Severe LV dysfunction EF 25 to 30%.  1+ MR.  Only minimally elevated LVEDP.   ORCHIECTOMY Right 01/17/2020   Procedure: ORCHIECTOMY;  Surgeon: Twylla Glendia BROCKS, MD;  Location: [facility] ORS;  Service: Urology;  Laterality: Right;   POLYPECTOMY  02/27/2020   Procedure: POLYPECTOMY;  Surgeon: Leigh Elspeth SQUIBB, MD;  Location: [facility] ENDOSCOPY;  Service: Gastroenterology;;   PROSTATE BIOPSY N/A 01/17/2020   Procedure: BIOPSY TRANSRECTAL ULTRASONIC PROSTATE (TUBP);  Surgeon: Twylla Glendia BROCKS, MD;  Location: [facility] ORS;  Service: Urology;  Laterality: N/A;   TRANSTHORACIC ECHOCARDIOGRAM  10/2018   (Most recent echo, to evaluate for TIA/CVA) : Moderate reduced EF 35  to 40%.  Moderate concentric LVH.  Mild dyskinesis of the apex and severe hypokinesis of mid apical anteroseptal and anterior wall.  Unable to assess RV pressures.  Aortic sclerosis with no  stenosis.  No significant change seen    SOCIAL HISTORY: Social History   Socioeconomic History   Marital status: Legally Separated    Spouse name: Dorothenia   Number of children: Not on file   Years of education: Not on file   Highest education level: Not on file  Occupational History   Occupation: cook  Tobacco Use   Smoking status: Some Days    Current packs/day: 0.00    Types: Cigarettes   Smokeless tobacco: Never   Tobacco comments:    quit three days ago  Vaping Use   Vaping status: Never Used  Substance and Sexual Activity   Alcohol use: Yes    Alcohol/week: 2.0 standard drinks of alcohol    Types: 2 Cans of beer per week    Comment: 2 Beers daily.   Drug use: Yes    Frequency: 3.0 times per week    Types: Marijuana   Sexual activity: Not Currently  Other Topics Concern   Not on file  Social History Narrative   Currently not working.  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  Now has cancer.   Social Drivers of Corporate Investment Banker Strain: Low Risk  (08/13/2023)   Overall Financial Resource Strain (CARDIA)    Difficulty of Paying Living Expenses: Not very hard  Food Insecurity: Low Risk (01/12/2024)   Received from [facility]   Hunger Vital Sign    Within the past 12 months, you worried that your food would run out before you got money to buy more: Never true    Within the past 12 months, the food you bought just didn't last and you didn't have money to get more. : Never true  Transportation Needs: No Transportation Needs (01/12/2024)   Received from [facility]    In the past 12 months, has lack of reliable transportation kept you from medical appointments, meetings, work or from getting things needed for daily living? : No  Physical Activity: Inactive (08/13/2023)   Exercise Vital Sign    Days of Exercise per Week: 0 days    Minutes of Exercise per Session: 0 min  Stress: No Stress Concern Present  (08/13/2023)   [employer] of Occupational Health - Occupational Stress Questionnaire    Feeling of Stress : Not at all  Social Connections: Unknown (08/13/2023)   Social Connection and Isolation Panel    Frequency of Communication with Friends and Family: More than three times a week    Frequency of Social Gatherings with Friends and Family: More than three times a week    Attends Religious Services: More than 4 times per year    Active Member of [employer] or Organizations: No    Attends Banker Meetings: Never    Marital Status: Patient declined  Catering Manager Violence: Not At Risk (08/13/2023)   Humiliation, Afraid, Rape, and Kick questionnaire    Fear of Current or Ex-Partner: No    Emotionally Abused: No    Physically Abused: No    Sexually Abused: No    FAMILY HISTORY: Family History  Problem Relation Age of Onset   Stroke Mother    Hypertension Mother    Diabetes type II Mother  Leukemia Other    Breast cancer Other    Stroke Brother    CAD Neg Hx     ALLERGIES:  has no known allergies.  MEDICATIONS:  Current Outpatient Medications  Medication Sig Dispense Refill   clopidogrel  (PLAVIX ) 75 MG tablet Take 1 tablet by mouth once daily 30 tablet 0   docusate sodium  (COLACE) 100 MG capsule Take 1 capsule (100 mg total) by mouth daily. 30 capsule 3   enzalutamide  (XTANDI ) 40 MG tablet Take 4 tablets (160 mg total) by mouth daily. 120 tablet 3   ezetimibe  (ZETIA ) 10 MG tablet Take 1 tablet (10 mg total) by mouth daily. 90 tablet 1   ferrous sulfate  325 (65 FE) MG EC tablet Take 1 tablet (325 mg total) by mouth 2 (two) times daily with a meal. 60 tablet 3   furosemide  (LASIX ) 20 MG tablet TAKE 1 TABLET BY MOUTH ONCE DAILY AS NEEDED FOR SHORTNESS OF BREATH OR  SWELLING. 90 tablet 3   metoprolol  succinate (TOPROL -XL) 50 MG 24 hr tablet Take 1 tablet (50 mg total) by mouth daily. Take with or immediately following a meal. 15 tablet 0   nitroGLYCERIN  (NITROSTAT )  0.4 MG SL tablet Place 1 tablet (0.4 mg total) under the tongue every 5 (five) minutes as needed for chest pain. 25 tablet 3   rosuvastatin  (CRESTOR ) 40 MG tablet Take 1 tablet (40 mg total) by mouth daily. 90 tablet 3   sacubitril -valsartan  (ENTRESTO ) 97-103 MG Take 1 tablet by mouth 2 (two) times daily. 180 tablet 2   spironolactone  (ALDACTONE ) 25 MG tablet Take 1 tablet (25 mg total) by mouth daily. 90 tablet 1   tamsulosin  (FLOMAX ) 0.4 MG CAPS capsule Take 1 capsule (0.4 mg total) by mouth daily after supper. 90 capsule 3   traZODone  (DESYREL ) 50 MG tablet Take 0.5-1 tablets (25-50 mg total) by mouth at bedtime as needed for sleep. 30 tablet 6   venlafaxine  XR (EFFEXOR  XR) 37.5 MG 24 hr capsule Take 1 capsule (37.5 mg total) by mouth daily with breakfast. 30 capsule 1   VENTOLIN  HFA 108 (90 Base) MCG/ACT inhaler Inhale 2 puffs into the lungs every 4 (four) hours as needed for wheezing or shortness of breath. 18 g 5   No current facility-administered medications for this visit.   Facility-Administered Medications Ordered in Other Visits  Medication Dose Route Frequency Provider Last Rate Last Admin   leuprolide  (6 Month) (ELIGARD ) injection 45 mg  45 mg Subcutaneous Once Babara Call, MD         PHYSICAL EXAMINATION: ECOG PERFORMANCE STATUS: 1 - Symptomatic but completely ambulatory Vitals:   02/17/24 1017 02/17/24 1024  BP: (!) 192/90 (!) 149/82  Pulse: 72   Resp: 18   Temp: 97.6 F (36.4 C)   SpO2: 100%    Filed Weights   02/17/24 1017  Weight: 164 lb 6.4 oz (74.6 kg)    Physical Exam Constitutional:      General: He is not in acute distress.    Comments: Thin built.   HENT:     Head: Normocephalic and atraumatic.  Eyes:     General: No scleral icterus. Neck:     Comments: Palpable right cervical mass Cardiovascular:     Rate and Rhythm: Normal rate.  Pulmonary:     Effort: Pulmonary effort is normal. No respiratory distress.  Abdominal:     General: Bowel sounds are  normal. There is no distension.     Palpations: Abdomen is soft.  Musculoskeletal:        General: No deformity. Normal range of motion.     Cervical back: Normal range of motion and neck supple.  Skin:    General: Skin is warm and dry.     Findings: No erythema or rash.  Neurological:     Mental Status: He is alert and oriented to person, place, and time. Mental status is at baseline.     Cranial Nerves: No cranial nerve deficit.  Psychiatric:        Mood and Affect: Mood normal.     LABORATORY DATA:  I have reviewed the data as listed    Latest Ref Rng & Units 02/17/2024    9:34 AM 11/12/2023   10:33 AM 10/07/2023    9:00 AM  CBC  WBC 4.0 - 10.5 K/uL 7.9  4.5  7.0   Hemoglobin 13.0 - 17.0 g/dL 88.2  87.6  88.0   Hematocrit 39.0 - 52.0 % 34.8  37.4  36.7   Platelets 150 - 400 K/uL 306  292  360       Latest Ref Rng & Units 02/17/2024    9:33 AM 11/12/2023   10:34 AM 10/07/2023    9:00 AM  CMP  Glucose 70 - 99 mg/dL 875  92  94   BUN 8 - 23 mg/dL 9  14  9    Creatinine 0.61 - 1.24 mg/dL 9.20  9.26  9.21   Sodium 135 - 145 mmol/L 140  134  140   Potassium 3.5 - 5.1 mmol/L 4.0  3.5  4.5   Chloride 98 - 111 mmol/L 102  100  102   CO2 22 - 32 mmol/L 24  22  20    Calcium  8.9 - 10.3 mg/dL 9.5  9.6  89.9   Total Protein 6.5 - 8.1 g/dL 7.3  8.0  7.7   Total Bilirubin 0.0 - 1.2 mg/dL 0.3  0.5  0.3   Alkaline Phos 38 - 126 U/L 76  56  80   AST 15 - 41 U/L 18  21  15    ALT 0 - 44 U/L 6  10  7       RADIOGRAPHIC STUDIES: I have personally reviewed the radiological images as listed and agreed with the findings in the report. No results found.

## 2024-02-17 NOTE — Assessment & Plan Note (Signed)
 Lab Results  Component Value Date   HGB 11.7 (L) 02/17/2024   TIBC 468 (H) 02/17/2024   IRONPCTSAT 19 02/17/2024   FERRITIN 19 (L) 02/17/2024   S/p IV venofer  treatments. Decreased iron  level. Recommend Venofer  weekly x 2 He declines GI work up due to multiple medical problems.  He is also on Plavix 

## 2024-02-17 NOTE — Assessment & Plan Note (Addendum)
 Due to canal stenosis, myelomalacia Refer to neurology

## 2024-02-17 NOTE — Assessment & Plan Note (Signed)
#  Germ cell tumor, stage II, status post radiation.  Monitor tumor markers

## 2024-02-17 NOTE — Assessment & Plan Note (Signed)
 Eligard  45mg  Q6 months.  Today and next due in June 2026

## 2024-02-17 NOTE — Assessment & Plan Note (Signed)
 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 No RT per Dr. Rushie Chestnut On Xtandi 160mg  in end of March 2024. -Today's PSA is pending. Continue Xtandi.

## 2024-02-17 NOTE — Assessment & Plan Note (Addendum)
 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  He is not interested in dental clearance for bisphosphonate. Continue monitor.

## 2024-02-18 ENCOUNTER — Encounter: Payer: Self-pay | Admitting: Oncology

## 2024-02-18 ENCOUNTER — Other Ambulatory Visit: Payer: Self-pay

## 2024-02-18 NOTE — Telephone Encounter (Signed)
 Daniel Weiss can you please schedule and notify patient of appointment dates and times.

## 2024-02-18 NOTE — Telephone Encounter (Signed)
-----   Message from Zelphia Cap, MD sent at 02/17/2024  8:04 PM EST ----- Please arrange him to get Venofer  weekly x 2 ----- Message ----- From: Interface, [facility] [place] Sent: 02/17/2024  11:52 AM EST To: Zelphia Cap, MD

## 2024-02-22 ENCOUNTER — Other Ambulatory Visit: Payer: Self-pay

## 2024-02-22 NOTE — Progress Notes (Signed)
 Pt aware of plan and will wait for call to get appt scheduled.

## 2024-02-22 NOTE — Progress Notes (Signed)
 Specialty Pharmacy Refill Coordination Note  Daniel Weiss is a 63 y.o. male contacted today regarding refills of specialty medication(s) Enzalutamide  (XTANDI )   Patient requested Delivery   Delivery date: 02/24/24   Verified address: [address]  [place] [address]   Medication will be filled on: 02/23/24

## 2024-02-23 ENCOUNTER — Other Ambulatory Visit: Payer: Self-pay

## 2024-03-04 ENCOUNTER — Ambulatory Visit (INDEPENDENT_AMBULATORY_CARE_PROVIDER_SITE_OTHER): Payer: Self-pay

## 2024-03-04 DIAGNOSIS — I255 Ischemic cardiomyopathy: Secondary | ICD-10-CM | POA: Diagnosis not present

## 2024-03-05 LAB — CUP PACEART REMOTE DEVICE CHECK
Battery Remaining Longevity: 43 mo
Battery Remaining Percentage: 45 %
Battery Voltage: 2.95 V
Brady Statistic AP VP Percent: 9.6 %
Brady Statistic AP VS Percent: 1 %
Brady Statistic AS VP Percent: 85 %
Brady Statistic AS VS Percent: 5.2 %
Brady Statistic RA Percent Paced: 9.4 %
Date Time Interrogation Session: 20251226020045
HighPow Impedance: 84 Ohm
Implantable Lead Connection Status: 753985
Implantable Lead Connection Status: 753985
Implantable Lead Connection Status: 753985
Implantable Lead Implant Date: 20220330
Implantable Lead Implant Date: 20220330
Implantable Lead Implant Date: 20220330
Implantable Lead Location: 753858
Implantable Lead Location: 753859
Implantable Lead Location: 753860
Implantable Pulse Generator Implant Date: 20220330
Lead Channel Impedance Value: 380 Ohm
Lead Channel Impedance Value: 450 Ohm
Lead Channel Impedance Value: 800 Ohm
Lead Channel Pacing Threshold Amplitude: 0.625 V
Lead Channel Pacing Threshold Amplitude: 0.625 V
Lead Channel Pacing Threshold Amplitude: 1.25 V
Lead Channel Pacing Threshold Pulse Width: 0.5 ms
Lead Channel Pacing Threshold Pulse Width: 0.5 ms
Lead Channel Pacing Threshold Pulse Width: 0.5 ms
Lead Channel Sensing Intrinsic Amplitude: 0.8 mV
Lead Channel Sensing Intrinsic Amplitude: 12 mV
Lead Channel Setting Pacing Amplitude: 1.625
Lead Channel Setting Pacing Amplitude: 1.625
Lead Channel Setting Pacing Amplitude: 1.75 V
Lead Channel Setting Pacing Pulse Width: 0.5 ms
Lead Channel Setting Pacing Pulse Width: 0.5 ms
Lead Channel Setting Sensing Sensitivity: 0.5 mV
Pulse Gen Serial Number: 111039291
Zone Setting Status: 755011

## 2024-03-07 ENCOUNTER — Ambulatory Visit: Payer: Self-pay | Admitting: Cardiology

## 2024-03-07 NOTE — Progress Notes (Signed)
 Remote ICD Transmission

## 2024-03-11 ENCOUNTER — Inpatient Hospital Stay: Admitting: Internal Medicine

## 2024-03-15 ENCOUNTER — Inpatient Hospital Stay

## 2024-03-18 ENCOUNTER — Other Ambulatory Visit: Payer: Self-pay

## 2024-03-18 ENCOUNTER — Telehealth: Payer: Self-pay | Admitting: Oncology

## 2024-03-18 NOTE — Telephone Encounter (Signed)
 Pt transport called to confirm date/time of next appt - [facility]

## 2024-03-22 ENCOUNTER — Other Ambulatory Visit: Payer: Self-pay

## 2024-03-22 ENCOUNTER — Inpatient Hospital Stay: Attending: Oncology

## 2024-03-22 ENCOUNTER — Inpatient Hospital Stay

## 2024-03-22 NOTE — Progress Notes (Signed)
 Pt educated to take B/P medications as soon as he gets home, to monitor B/P and call PCP if B/P remains elevated and to seek emergency care if hypertension symptoms arise such as but not limited to headaches, numbness in one side, slurred speech, etc. Pt verbalizes understanding. Pt escorted to scheduling to reschedule today's Venofer  infusion per Dr. Babara. Pt stable at this time.

## 2024-03-22 NOTE — Progress Notes (Signed)
 Specialty Pharmacy Refill Coordination Note  Daniel Weiss is a 64 y.o. male contacted today regarding refills of specialty medication(s) Enzalutamide  (XTANDI )   Patient requested Delivery   Delivery date: 03/24/24   Verified address: [address]  [place] [address]   Medication will be filled on: 03/23/24  Patient aware of $4.90 copay, attempted to obtain updated payment but per patient he does not have a debit or credit card. Patient has active AR, advised patient we can bill AR account but he will need to make a payment on account.

## 2024-03-23 ENCOUNTER — Other Ambulatory Visit: Payer: Self-pay

## 2024-03-28 ENCOUNTER — Inpatient Hospital Stay: Attending: Oncology

## 2024-03-28 VITALS — BP 169/89 | HR 75 | Temp 96.5°F | Resp 18

## 2024-03-28 DIAGNOSIS — C61 Malignant neoplasm of prostate: Secondary | ICD-10-CM

## 2024-03-28 MED ORDER — IRON SUCROSE 20 MG/ML IV SOLN
200.0000 mg | Freq: Once | INTRAVENOUS | Status: AC
  Administered 2024-03-28: 200 mg via INTRAVENOUS
  Filled 2024-03-28: qty 10

## 2024-03-28 NOTE — Patient Instructions (Signed)

## 2024-03-31 ENCOUNTER — Telehealth: Payer: Self-pay | Admitting: Internal Medicine

## 2024-03-31 NOTE — Telephone Encounter (Signed)
 Called patient and lvm about needing to reschedule his appoint that was on 1/23. We have rescheduled to 1/30 I shared the date and time with him and asked him to call back if this did not work of if he had any questions.

## 2024-04-01 ENCOUNTER — Inpatient Hospital Stay: Admitting: Internal Medicine

## 2024-04-04 ENCOUNTER — Telehealth: Payer: Self-pay | Admitting: Oncology

## 2024-04-04 NOTE — Telephone Encounter (Signed)
 Due to weather, clinic is on a delayed start on 1/27. Due to r/s multiple appts I had to move appt to a different day. I left vm for pt with updated date and time.

## 2024-04-05 ENCOUNTER — Inpatient Hospital Stay

## 2024-04-07 ENCOUNTER — Inpatient Hospital Stay

## 2024-04-08 ENCOUNTER — Inpatient Hospital Stay: Admitting: Internal Medicine

## 2024-04-08 ENCOUNTER — Encounter: Payer: Self-pay | Admitting: Internal Medicine

## 2024-04-08 VITALS — BP 167/88 | HR 79 | Temp 98.6°F | Resp 20

## 2024-04-08 DIAGNOSIS — M4722 Other spondylosis with radiculopathy, cervical region: Secondary | ICD-10-CM | POA: Diagnosis not present

## 2024-04-15 ENCOUNTER — Other Ambulatory Visit: Payer: Self-pay

## 2024-05-19 ENCOUNTER — Inpatient Hospital Stay: Admitting: Oncology

## 2024-05-19 ENCOUNTER — Inpatient Hospital Stay

## 2024-06-21 ENCOUNTER — Ambulatory Visit: Admitting: Dermatology
